# Patient Record
Sex: Female | Born: 1940 | Race: Black or African American | Hispanic: No | Smoking: Former smoker
Health system: Southern US, Community
[De-identification: ages and names within clinical notes are randomized; demographics above are authoritative.]

## PROBLEM LIST (undated history)

## (undated) DIAGNOSIS — E7800 Pure hypercholesterolemia, unspecified: Secondary | ICD-10-CM

## (undated) DIAGNOSIS — K219 Gastro-esophageal reflux disease without esophagitis: Secondary | ICD-10-CM

## (undated) DIAGNOSIS — M1990 Unspecified osteoarthritis, unspecified site: Secondary | ICD-10-CM

## (undated) DIAGNOSIS — J40 Bronchitis, not specified as acute or chronic: Secondary | ICD-10-CM

## (undated) DIAGNOSIS — IMO0001 Reserved for inherently not codable concepts without codable children: Secondary | ICD-10-CM

## (undated) DIAGNOSIS — R42 Dizziness and giddiness: Secondary | ICD-10-CM

## (undated) DIAGNOSIS — Z8701 Personal history of pneumonia (recurrent): Secondary | ICD-10-CM

## (undated) DIAGNOSIS — Z872 Personal history of diseases of the skin and subcutaneous tissue: Secondary | ICD-10-CM

## (undated) DIAGNOSIS — K449 Diaphragmatic hernia without obstruction or gangrene: Secondary | ICD-10-CM

## (undated) DIAGNOSIS — D509 Iron deficiency anemia, unspecified: Secondary | ICD-10-CM

## (undated) DIAGNOSIS — K5520 Angiodysplasia of colon without hemorrhage: Secondary | ICD-10-CM

## (undated) DIAGNOSIS — I10 Essential (primary) hypertension: Secondary | ICD-10-CM

## (undated) HISTORY — DX: Iron deficiency anemia, unspecified: D50.9

## (undated) HISTORY — DX: Essential (primary) hypertension: I10

## (undated) HISTORY — PX: OTHER SURGICAL HISTORY: SHX169

## (undated) HISTORY — DX: Personal history of diseases of the skin and subcutaneous tissue: Z87.2

## (undated) HISTORY — PX: ABDOMINAL HYSTERECTOMY: SHX81

## (undated) HISTORY — DX: Bronchitis, not specified as acute or chronic: J40

## (undated) HISTORY — PX: CHOLECYSTECTOMY: SHX55

## (undated) HISTORY — DX: Personal history of pneumonia (recurrent): Z87.01

## (undated) HISTORY — DX: Unspecified osteoarthritis, unspecified site: M19.90

## (undated) HISTORY — PX: EYE SURGERY: SHX253

## (undated) HISTORY — DX: Angiodysplasia of colon without hemorrhage: K55.20

## (undated) HISTORY — PX: APPENDECTOMY: SHX54

---

## 2008-04-30 ENCOUNTER — Emergency Department (HOSPITAL_COMMUNITY): Admission: EM | Admit: 2008-04-30 | Discharge: 2008-04-30 | Payer: Self-pay | Admitting: Emergency Medicine

## 2008-07-21 ENCOUNTER — Emergency Department (HOSPITAL_COMMUNITY): Admission: EM | Admit: 2008-07-21 | Discharge: 2008-07-21 | Payer: Self-pay | Admitting: Emergency Medicine

## 2008-07-22 ENCOUNTER — Emergency Department (HOSPITAL_COMMUNITY): Admission: EM | Admit: 2008-07-22 | Discharge: 2008-07-22 | Payer: Self-pay | Admitting: Internal Medicine

## 2008-07-29 ENCOUNTER — Emergency Department (HOSPITAL_COMMUNITY): Admission: EM | Admit: 2008-07-29 | Discharge: 2008-07-29 | Payer: Self-pay | Admitting: Emergency Medicine

## 2008-08-14 ENCOUNTER — Emergency Department (HOSPITAL_COMMUNITY): Admission: EM | Admit: 2008-08-14 | Discharge: 2008-08-14 | Payer: Self-pay | Admitting: Emergency Medicine

## 2008-08-15 ENCOUNTER — Ambulatory Visit (HOSPITAL_COMMUNITY): Admission: RE | Admit: 2008-08-15 | Discharge: 2008-08-15 | Payer: Self-pay | Admitting: Emergency Medicine

## 2008-10-01 ENCOUNTER — Emergency Department (HOSPITAL_COMMUNITY): Admission: EM | Admit: 2008-10-01 | Discharge: 2008-10-01 | Payer: Self-pay | Admitting: Emergency Medicine

## 2008-11-30 ENCOUNTER — Emergency Department (HOSPITAL_COMMUNITY): Admission: EM | Admit: 2008-11-30 | Discharge: 2008-11-30 | Payer: Self-pay | Admitting: Emergency Medicine

## 2008-12-22 ENCOUNTER — Emergency Department (HOSPITAL_COMMUNITY): Admission: EM | Admit: 2008-12-22 | Discharge: 2008-12-22 | Payer: Self-pay | Admitting: Emergency Medicine

## 2008-12-25 ENCOUNTER — Inpatient Hospital Stay (HOSPITAL_COMMUNITY): Admission: EM | Admit: 2008-12-25 | Discharge: 2008-12-26 | Payer: Self-pay | Admitting: Emergency Medicine

## 2009-04-14 HISTORY — PX: ESOPHAGOGASTRODUODENOSCOPY: SHX1529

## 2009-04-14 HISTORY — PX: COLONOSCOPY W/ BIOPSIES: SHX1374

## 2009-05-09 ENCOUNTER — Ambulatory Visit: Payer: Self-pay | Admitting: Cardiology

## 2009-05-09 ENCOUNTER — Inpatient Hospital Stay (HOSPITAL_COMMUNITY): Admission: EM | Admit: 2009-05-09 | Discharge: 2009-05-12 | Payer: Self-pay | Admitting: Emergency Medicine

## 2009-05-10 ENCOUNTER — Ambulatory Visit: Payer: Self-pay | Admitting: Gastroenterology

## 2009-05-11 ENCOUNTER — Ambulatory Visit: Payer: Self-pay | Admitting: Gastroenterology

## 2009-05-11 ENCOUNTER — Encounter: Payer: Self-pay | Admitting: Gastroenterology

## 2009-05-26 ENCOUNTER — Telehealth (INDEPENDENT_AMBULATORY_CARE_PROVIDER_SITE_OTHER): Payer: Self-pay

## 2009-06-08 ENCOUNTER — Encounter: Payer: Self-pay | Admitting: Gastroenterology

## 2009-06-16 ENCOUNTER — Telehealth (INDEPENDENT_AMBULATORY_CARE_PROVIDER_SITE_OTHER): Payer: Self-pay

## 2009-07-01 ENCOUNTER — Ambulatory Visit: Payer: Self-pay | Admitting: Gastroenterology

## 2009-07-01 DIAGNOSIS — K5520 Angiodysplasia of colon without hemorrhage: Secondary | ICD-10-CM

## 2009-07-01 HISTORY — DX: Angiodysplasia of colon without hemorrhage: K55.20

## 2009-07-29 ENCOUNTER — Ambulatory Visit (HOSPITAL_COMMUNITY): Admission: RE | Admit: 2009-07-29 | Discharge: 2009-07-29 | Payer: Self-pay | Admitting: Cardiology

## 2009-07-29 ENCOUNTER — Ambulatory Visit: Payer: Self-pay | Admitting: Cardiology

## 2009-07-29 ENCOUNTER — Encounter: Payer: Self-pay | Admitting: Cardiology

## 2009-09-01 ENCOUNTER — Encounter (INDEPENDENT_AMBULATORY_CARE_PROVIDER_SITE_OTHER): Payer: Self-pay

## 2009-09-08 ENCOUNTER — Encounter: Payer: Self-pay | Admitting: Gastroenterology

## 2009-09-08 LAB — CONVERTED CEMR LAB
Basophils Absolute: 0 10*3/uL (ref 0.0–0.1)
Basophils Relative: 0 % (ref 0–1)
Eosinophils Relative: 1 % (ref 0–5)
Ferritin: 4 ng/mL — ABNORMAL LOW (ref 10–291)
Hemoglobin: 11.2 g/dL — ABNORMAL LOW (ref 12.0–15.0)
Lymphocytes Relative: 42 % (ref 12–46)
MCHC: 31.6 g/dL (ref 30.0–36.0)
Monocytes Absolute: 0.4 10*3/uL (ref 0.1–1.0)
Neutro Abs: 2.7 10*3/uL (ref 1.7–7.7)
Platelets: 338 10*3/uL (ref 150–400)
RDW: 17.1 % — ABNORMAL HIGH (ref 11.5–15.5)

## 2009-09-09 ENCOUNTER — Encounter: Payer: Self-pay | Admitting: Gastroenterology

## 2009-09-23 ENCOUNTER — Ambulatory Visit (HOSPITAL_COMMUNITY): Payer: Self-pay | Admitting: Oncology

## 2009-09-23 ENCOUNTER — Encounter: Payer: Self-pay | Admitting: Gastroenterology

## 2009-09-23 ENCOUNTER — Encounter (HOSPITAL_COMMUNITY): Admission: RE | Admit: 2009-09-23 | Discharge: 2009-10-23 | Payer: Self-pay | Admitting: Oncology

## 2009-10-05 ENCOUNTER — Ambulatory Visit (HOSPITAL_COMMUNITY): Admission: RE | Admit: 2009-10-05 | Discharge: 2009-10-05 | Payer: Self-pay | Admitting: Oncology

## 2009-11-10 ENCOUNTER — Ambulatory Visit (HOSPITAL_COMMUNITY): Payer: Self-pay | Admitting: Oncology

## 2009-11-10 ENCOUNTER — Encounter (HOSPITAL_COMMUNITY): Admission: RE | Admit: 2009-11-10 | Discharge: 2009-12-10 | Payer: Self-pay | Admitting: Oncology

## 2009-12-22 ENCOUNTER — Encounter: Payer: Self-pay | Admitting: Gastroenterology

## 2010-01-05 ENCOUNTER — Encounter (HOSPITAL_COMMUNITY): Admission: RE | Admit: 2010-01-05 | Discharge: 2010-02-04 | Payer: Self-pay | Admitting: Oncology

## 2010-01-05 ENCOUNTER — Ambulatory Visit (HOSPITAL_COMMUNITY): Payer: Self-pay | Admitting: Oncology

## 2010-01-06 ENCOUNTER — Telehealth: Payer: Self-pay | Admitting: Gastroenterology

## 2010-01-06 ENCOUNTER — Encounter: Payer: Self-pay | Admitting: Gastroenterology

## 2010-01-12 ENCOUNTER — Ambulatory Visit: Payer: Self-pay | Admitting: Gastroenterology

## 2010-01-12 DIAGNOSIS — D500 Iron deficiency anemia secondary to blood loss (chronic): Secondary | ICD-10-CM

## 2010-01-14 ENCOUNTER — Encounter: Payer: Self-pay | Admitting: Gastroenterology

## 2010-02-03 ENCOUNTER — Encounter: Payer: Self-pay | Admitting: Gastroenterology

## 2010-02-10 ENCOUNTER — Encounter (HOSPITAL_COMMUNITY): Admission: RE | Admit: 2010-02-10 | Discharge: 2010-03-12 | Payer: Self-pay | Admitting: Oncology

## 2010-02-12 HISTORY — PX: OTHER SURGICAL HISTORY: SHX169

## 2010-02-19 ENCOUNTER — Encounter: Payer: Self-pay | Admitting: Gastroenterology

## 2010-04-15 ENCOUNTER — Encounter (HOSPITAL_COMMUNITY): Admission: RE | Admit: 2010-04-15 | Discharge: 2010-05-15 | Payer: Self-pay | Admitting: Oncology

## 2010-04-15 ENCOUNTER — Ambulatory Visit: Payer: Self-pay | Admitting: Internal Medicine

## 2010-04-15 ENCOUNTER — Ambulatory Visit (HOSPITAL_COMMUNITY): Payer: Self-pay | Admitting: Oncology

## 2010-04-15 DIAGNOSIS — K649 Unspecified hemorrhoids: Secondary | ICD-10-CM | POA: Insufficient documentation

## 2010-04-25 ENCOUNTER — Inpatient Hospital Stay (HOSPITAL_COMMUNITY): Admission: EM | Admit: 2010-04-25 | Discharge: 2010-04-27 | Payer: Self-pay | Admitting: Emergency Medicine

## 2010-04-28 ENCOUNTER — Encounter: Payer: Self-pay | Admitting: Gastroenterology

## 2010-04-29 ENCOUNTER — Encounter: Payer: Self-pay | Admitting: Urgent Care

## 2010-05-02 ENCOUNTER — Emergency Department (HOSPITAL_COMMUNITY): Admission: EM | Admit: 2010-05-02 | Discharge: 2010-05-02 | Payer: Self-pay | Admitting: Emergency Medicine

## 2010-05-25 ENCOUNTER — Encounter (HOSPITAL_COMMUNITY): Admission: RE | Admit: 2010-05-25 | Discharge: 2010-06-24 | Payer: Self-pay | Admitting: Oncology

## 2010-05-25 ENCOUNTER — Emergency Department (HOSPITAL_COMMUNITY): Admission: EM | Admit: 2010-05-25 | Discharge: 2010-05-25 | Payer: Self-pay | Admitting: Emergency Medicine

## 2010-06-15 ENCOUNTER — Ambulatory Visit (HOSPITAL_COMMUNITY): Payer: Self-pay | Admitting: Oncology

## 2010-06-19 ENCOUNTER — Emergency Department (HOSPITAL_COMMUNITY): Admission: EM | Admit: 2010-06-19 | Discharge: 2010-06-20 | Payer: Self-pay | Admitting: Emergency Medicine

## 2010-06-24 ENCOUNTER — Emergency Department (HOSPITAL_COMMUNITY): Admission: EM | Admit: 2010-06-24 | Discharge: 2010-06-24 | Payer: Self-pay | Admitting: Emergency Medicine

## 2010-07-12 ENCOUNTER — Encounter (HOSPITAL_COMMUNITY): Admission: RE | Admit: 2010-07-12 | Discharge: 2010-08-11 | Payer: Self-pay | Admitting: Oncology

## 2010-08-17 ENCOUNTER — Encounter (HOSPITAL_COMMUNITY): Admission: RE | Admit: 2010-08-17 | Discharge: 2010-09-16 | Payer: Self-pay | Admitting: Oncology

## 2010-08-17 ENCOUNTER — Ambulatory Visit (HOSPITAL_COMMUNITY): Payer: Self-pay | Admitting: Oncology

## 2010-10-19 ENCOUNTER — Ambulatory Visit (HOSPITAL_COMMUNITY)
Admission: RE | Admit: 2010-10-19 | Discharge: 2010-10-19 | Payer: Self-pay | Source: Home / Self Care | Admitting: Family Medicine

## 2010-10-19 ENCOUNTER — Ambulatory Visit (HOSPITAL_COMMUNITY): Payer: Self-pay | Admitting: Oncology

## 2010-10-19 ENCOUNTER — Encounter (HOSPITAL_COMMUNITY)
Admission: RE | Admit: 2010-10-19 | Discharge: 2010-11-18 | Payer: Self-pay | Source: Home / Self Care | Attending: Oncology | Admitting: Oncology

## 2010-12-14 ENCOUNTER — Ambulatory Visit (HOSPITAL_COMMUNITY)
Admission: RE | Admit: 2010-12-14 | Discharge: 2010-12-14 | Payer: Self-pay | Source: Home / Self Care | Attending: Oncology | Admitting: Oncology

## 2010-12-14 ENCOUNTER — Encounter (HOSPITAL_COMMUNITY)
Admission: RE | Admit: 2010-12-14 | Discharge: 2010-12-14 | Payer: Self-pay | Source: Home / Self Care | Attending: Oncology | Admitting: Oncology

## 2010-12-14 LAB — CBC
HCT: 35.5 % — ABNORMAL LOW (ref 36.0–46.0)
MCHC: 33.2 g/dL (ref 30.0–36.0)
MCV: 91 fL (ref 78.0–100.0)
Platelets: 277 10*3/uL (ref 150–400)
RDW: 13.8 % (ref 11.5–15.5)
WBC: 4.2 10*3/uL (ref 4.0–10.5)

## 2010-12-14 NOTE — Letter (Signed)
Summary: DBE-DR Gwinda Passe  DBE-DR Gwinda Passe   Imported By: Ave Filter 04/29/2010 13:32:31  _____________________________________________________________________  External Attachment:    Type:   Image     Comment:   External Document

## 2010-12-14 NOTE — Letter (Signed)
Summary: [facility]  [facility]   Imported By: Diana Eves 02/03/2010 15:26:03  _____________________________________________________________________  External Attachment:    Type:   Image     Comment:   External Document

## 2010-12-14 NOTE — Assessment & Plan Note (Signed)
Summary: FeDA/SB AVMS   Visit Type:  Follow-up Visit Primary Care Provider:  Casilda Carls, NP-c  Chief Complaint:  FeDA.  History of Present Illness: Problems with R hip and knee. Seeing Dr. Dorris Carnes for IVFe : 3/2,10. Not taking Ibuprofen, Motrin, or Aleve. No rectal bleeding or black tarry stool. Takes oral iron and makes stool dark.  Current Medications (verified): 1)  Cyanocobalamin 1000 Mcg/ml Soln (Cyanocobalamin) .... One Injection Monthly 2)  Fluticasone Propionate 50 Mcg/act Susp (Fluticasone Propionate) .... As Directed 3)  Docusate Sodium .... Take 1 Tablet By Mouth Two Times A Day As Needed 4)  Nexium 40 Mg Cpdr (Esomeprazole Magnesium) .... Take 1 Tablet By Mouth Once A Day 5)  Pravachol 40 Mg Tabs (Pravastatin Sodium) .... Take 1 Tab By Mouth At Bedtime 6)  Antivert 25 Mg Tabs (Meclizine Hcl) .... Take One To Two Tablets Daily 7)  Cetirizine Hcl 10 Mg Tabs (Cetirizine Hcl) .... Take 1 Tablet By Mouth Once A Day 8)  Multivitamin .... One Tablet Daily 9)  Tylenol Arthritis .... As Needed 10)  Tylenol .... As Needed 11)  Bifera 28 Mg Tabs (Polysacch Fe Cmp-Fe Heme Poly) .... Take 1 Tablet By Mouth Once A Day 12)  Carafate 1 Gm/47ml Susp (Sucralfate) .Marland Kitchen.. 1 Gm By Mouth Qid As Needed Dyspepsia  Allergies (verified): 1)  ! Sulfa 2)  ! * Latex  Past History:  Past Medical History: FeDA/SMALL BOWEL avmS **JUNE 2010-EGD/bX-GASTRITIS/Colonoscopy/polypectomy-polypoid mucosa; OCT 2010: Hb 11.2, MCV 84.1, Ferritin 4; PLACED ON ORAL Fe-Ferritin <1-->NOV 2010: IVFe- Hb 10.6 Ferritin 594; DEC 2010: Hb 11.2, Ferritin 146; FEB 2011- Hb 11.7, Ferritin 23  GERD Hiatal Hernia DDD  Past Surgical History: Reviewed history from 07/01/2009 and no changes required. Appendectomy Hysterectomy Cholecystectomy  Vital Signs:  Patient profile:   70 year old female Height:      64 inches Weight:      146 pounds BMI:     25.15 Temp:     98.9 degrees F oral Pulse rate:   80 / minute BP sitting:    138 / 88  (left arm) Cuff size:   regular  Vitals Entered By: Cloria Spring LPN (January 12, 9562 10:18 AM)  Physical Exam  General:  Well developed, well nourished, no acute distress. Head:  Normocephalic and atraumatic. Lungs:  Clear throughout to auscultation. Heart:  Regular rate and rhythm; no murmurs Abdomen:  Soft, nontender and nondistended.  Normal bowel sounds. Extremities:  walks with a limp Neurologic:  Alert and  oriented x4;  grossly normal neurologically.  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 Needs DBE for ablation of AVMs. OPV in 3 mos.  CC: PCP and Dr. Mariel Sleet  Orders: Est. Patient Level III [phone])

## 2010-12-14 NOTE — Letter (Signed)
Summary: External Other  External Other   Imported By: Peggyann Shoals 04/28/2010 14:52:05  _____________________________________________________________________  External Attachment:    Type:   Image     Comment:   External Document

## 2010-12-14 NOTE — Letter (Signed)
Summary: [facility] office note  [facility] office note   Imported By: Curtis Sites 04/29/2010 10:58:17  _____________________________________________________________________  External Attachment:    Type:   Image     Comment:   External Document

## 2010-12-14 NOTE — Progress Notes (Signed)
Summary: GIB Bleed/AVMs  Spoke with Dr. Mariel Sleet, [facility]. Pt had IV FE infusions and Ferritin > 500 NOV 2010. Recheck Ferritin JAN 2011, < 25. Pt has known Hx: AVMs in the small bowel. Refer to [facility] for DBE/ablation of smal bowel AVMs. Melody Mitchell  January 06, 2010 1:30 PM   Please call pt. Arrange for OPV w/ [facility] to discuss plan w/i the next week. May use 1130 slot. Melody Mitchell  January 06, 2010 1:30 PM  Appended Document: GIB Bleed/AVMs Referral faxed to [facility]

## 2010-12-14 NOTE — Letter (Signed)
Summary: [facility] REFERRAL  [facility] REFERRAL   Imported By: Ave Filter 01/06/2010 15:44:14  _____________________________________________________________________  External Attachment:    Type:   Image     Comment:   External Document  Appended Document: [facility] REFERRAL Per Marchelle Folks at [facility] the pt's info has been given to Dr Gwinda Passe and he should give her a date w/i the next couple of days.

## 2010-12-14 NOTE — Letter (Signed)
Summary: [facility] APPT CONFIRMATION  [facility] APPT CONFIRMATION   Imported By: Ave Filter 01/14/2010 08:35:16  _____________________________________________________________________  External Attachment:    Type:   Image     Comment:   External Document

## 2010-12-14 NOTE — Assessment & Plan Note (Signed)
Summary: FU3,IRON DEFF,ANEMIA,GU   Visit Type:  Follow-up Visit Primary Care Provider:  Ennis Forts, Lewayne Bunting  Chief Complaint:  follow up- anemia and doing good.  History of Present Illness: 70 y/o black female w/ hx IDA 2* multiple jejunal/duodenal AVMs s/p APC at [facility] by Dr Gwinda Passe.  Has required parenteral iron through Dr Thornton Papas services.  Had labs this AM.  Denies any melena.  Hs had bright red blood from her hemorrhoids.  Denies CP, weakness, palpitations or SOB.  Denies abd pain, N/V, constipation or diarrhea.  Wt states she's gained a few #'s. Appetite good.    Current Problems (verified): 1)  Anemia, Iron Deficiency  (ICD-280.9) 2)  Arteriovenous Malformation  (ICD-747.60)  Current Medications (verified): 1)  Cyanocobalamin 1000 Mcg/ml Soln (Cyanocobalamin) .... One Injection Monthly 2)  Fluticasone Propionate 50 Mcg/act Susp (Fluticasone Propionate) .... As Directed 3)  Docusate Sodium .... Take 1 Tablet By Mouth Two Times A Day As Needed 4)  Pravachol 40 Mg Tabs (Pravastatin Sodium) .... Take 1 Tab By Mouth At Bedtime 5)  Antivert 25 Mg Tabs (Meclizine Hcl) .... Take One To Two Tablets Daily 6)  Cetirizine Hcl 10 Mg Tabs (Cetirizine Hcl) .... Take 1 Tablet By Mouth Once A Day 7)  Multivitamin .... One Tablet Daily 8)  Tylenol Arthritis .... As Needed 9)  Tylenol .... As Needed 10)  Bifera 28 Mg Tabs (Polysacch Fe Cmp-Fe Heme Poly) .... Take 1 Tablet By Mouth Once A Day 11)  Carafate 1 Gm/54ml Susp (Sucralfate) .Marland Kitchen.. 1 Gm By Mouth Qid As Needed Dyspepsia 12)  Omeprazole 20 Mg Cpdr (Omeprazole) .... Once Daily 13)  Visine Dry Eye .... As Needed 14)  Anusol-Hc 25 Mg Supp (Hydrocortisone Acetate) .Marland Kitchen.. 1 Pr Bid  Allergies (verified): 1)  ! Sulfa 2)  ! * Latex  Past History:  Past Medical History: FeDA/SMALL BOWEL avmS **JUNE 2010-EGD/bX-GASTRITIS/Colonoscopy/polypectomy-polypoid mucosa; OCT 2010: Hb 11.2, MCV 84.1, Ferritin 4; PLACED ON ORAL Fe-Ferritin <1-->NOV  2010: IVFe- Hb 10.6 Ferritin 594; DEC 2010: Hb 11.2, Ferritin 146; FEB 2011- Hb 11.7, Ferritin 23 APC SB AVMS [facility] Dr Gwinda Passe  GERD Hiatal Hernia DDD hemorrhoids  Review of Systems      See HPI General:  Denies fever, chills, sweats, anorexia, fatigue, weakness, malaise, weight loss, and sleep disorder; occ dizziness AM. CV:  Denies chest pains, angina, palpitations, syncope, dyspnea on exertion, orthopnea, PND, peripheral edema, and claudication. Resp:  Denies dyspnea at rest, dyspnea with exercise, cough, sputum, wheezing, coughing up blood, and pleurisy. Neuro:  Denies weakness, paralysis, abnormal sensation, seizures, syncope, tremors, vertigo, transient blindness, frequent falls, frequent headaches, difficulty walking, headache, sciatica, radiculopathy other:, restless legs, memory loss, and confusion. Heme:  Denies bruising and enlarged lymph nodes.  Vital Signs:  Patient profile:   70 year old female Height:      64 inches Weight:      149 pounds BMI:     25.67 Temp:     98.7 degrees F oral Pulse rate:   64 / minute BP sitting:   112 / 82  (left arm) Cuff size:   regular  Vitals Entered By: Hendricks Limes LPN (April 15, 8118 8:57 AM)  Physical Exam  General:  Well developed, well nourished, no acute distress. Head:  Normocephalic and atraumatic. Eyes:  Sclera clear, no icterus. Mouth:  No deformity or lesions, dentition normal. Neck:  Supple; no masses or thyromegaly. Chest Wall:  Symmetrical;  no deformities or tenderness. Heart:  Regular rate and rhythm;  no murmurs, rubs,  or bruits. Abdomen:  Soft, nontender and nondistended. No masses, hepatosplenomegaly or hernias noted. Normal bowel sounds.without guarding and without rebound.   Msk:  Symmetrical with no gross deformities. Normal posture. Pulses:  Normal pulses noted. Extremities:  No clubbing, cyanosis, edema or deformities noted. Neurologic:  Alert and  oriented x4;  grossly normal neurologically. Skin:   Intact without significant lesions or rashes. Psych:  Alert and cooperative. Normal mood and affect.  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  Doing well.   Orders: Est. Patient Level III (24401)  Problem # 2:  ANEMIA, IRON DEFICIENCY (ICD-280.9)  Parenteral iron under direction Dr Mariel Sleet.  Labs drawn today.  Orders: Est. Patient Level III (02725)  Problem # 3:  HEMORRHOIDS (ICD-455.6)  Orders: Est. Patient Level III (36644)  Patient Instructions: 1)  Begin anusol-hc supp 2)  Requested labs Dr Mariel Sleet drawn today 3)  Keep 05/25/10 appt Dr Mariel Sleet 4)  Follow 3 months or sooner if bleeding or unable to maintain hemoglobin 5)  Talk to your PCP about knee pain if tylenol arthritis no help.  We would like to avoid NSAIDs if at all possible. Prescriptions: ANUSOL-HC 25 MG SUPP (HYDROCORTISONE ACETATE) 1 PR BID  #20 x 0   Entered and Authorized by:   Joselyn Arrow FNP-BC   Signed by:   Joselyn Arrow FNP-BC on 04/15/2010   Method used:   Electronically to        [facility] (retail)       [address]       [place], [place]  [address]       Ph: [phone]       Fax: [phone]   RxID:   [id number]   Appended Document: FU3,IRON DEFF,ANEMIA,GU reminder in computer  Appended Document: FU3,IRON DEFF,ANEMIA,GU Please get recent CBC from Dr Thornton Papas office.  Thanks  Appended Document: FU3,IRON DEFF,ANEMIA,GU do we have most recent CBC from [facility] yet?  Appended Document: Berenda Morale [facility] for nurses @ Specialty to fax over the lastest CBC.  Appended Document: FU3,IRON DEFF,ANEMIA,GU Please see CBC of 04/26/2010. Per Dr. Arnell Asal office, they do not have a CBC more recent.  Appended Document: FU3,IRON DEFF,ANEMIA,GU Hgb 11.2, Hct 33.3 on 04/15/10, ferritin 115  Appended Document: FU3,IRON  DEFF,ANEMIA,GU JULY 2011-OPV W/ DR. Mariel Sleet.

## 2010-12-14 NOTE — Letter (Signed)
Summary: OFFICE NOTE/DR Mariel Sleet  OFFICE NOTE/DR [facility]   Imported By: Diana Eves 12/22/2009 15:54:10  _____________________________________________________________________  External Attachment:    Type:   Image     Comment:   External Document

## 2010-12-15 ENCOUNTER — Ambulatory Visit (HOSPITAL_COMMUNITY): Payer: Self-pay | Admitting: Oncology

## 2010-12-20 ENCOUNTER — Encounter (HOSPITAL_COMMUNITY): Payer: Medicare Other | Attending: Oncology | Admitting: Oncology

## 2010-12-20 DIAGNOSIS — Z79899 Other long term (current) drug therapy: Secondary | ICD-10-CM | POA: Insufficient documentation

## 2010-12-20 DIAGNOSIS — D509 Iron deficiency anemia, unspecified: Secondary | ICD-10-CM | POA: Insufficient documentation

## 2011-01-03 ENCOUNTER — Ambulatory Visit (INDEPENDENT_AMBULATORY_CARE_PROVIDER_SITE_OTHER): Payer: Medicare Other | Admitting: Urgent Care

## 2011-01-03 ENCOUNTER — Encounter: Payer: Self-pay | Admitting: Urgent Care

## 2011-01-03 DIAGNOSIS — D509 Iron deficiency anemia, unspecified: Secondary | ICD-10-CM

## 2011-01-03 DIAGNOSIS — K219 Gastro-esophageal reflux disease without esophagitis: Secondary | ICD-10-CM | POA: Insufficient documentation

## 2011-01-03 DIAGNOSIS — Q279 Congenital malformation of peripheral vascular system, unspecified: Secondary | ICD-10-CM

## 2011-01-05 ENCOUNTER — Emergency Department (HOSPITAL_COMMUNITY): Payer: Medicare Other

## 2011-01-05 ENCOUNTER — Emergency Department (HOSPITAL_COMMUNITY)
Admission: EM | Admit: 2011-01-05 | Discharge: 2011-01-05 | Disposition: A | Discharge status: Home / Self Care | Payer: Medicare Other | Attending: Emergency Medicine | Admitting: Emergency Medicine

## 2011-01-05 DIAGNOSIS — R42 Dizziness and giddiness: Secondary | ICD-10-CM | POA: Insufficient documentation

## 2011-01-05 DIAGNOSIS — E876 Hypokalemia: Secondary | ICD-10-CM | POA: Insufficient documentation

## 2011-01-05 LAB — BASIC METABOLIC PANEL
CO2: 27 mEq/L (ref 19–32)
Calcium: 8.9 mg/dL (ref 8.4–10.5)
Creatinine, Ser: 0.73 mg/dL (ref 0.4–1.2)
GFR calc Af Amer: >60 mL/min (ref >60)
GFR calc non Af Amer: >60 mL/min (ref >60)
Sodium: 143 mEq/L (ref 135–145)

## 2011-01-05 LAB — DIFFERENTIAL
Basophils Absolute: 0 10*3/uL (ref 0.0–0.1)
Basophils Relative: 0 % (ref 0–1)
Eosinophils Absolute: 0.1 10*3/uL (ref 0.0–0.7)
Monocytes Absolute: 0.3 10*3/uL (ref 0.1–1.0)
Monocytes Relative: 6 % (ref 3–12)

## 2011-01-05 LAB — CBC
MCH: 30.2 pg (ref 26.0–34.0)
MCHC: 33 g/dL (ref 30.0–36.0)
Platelets: 263 10*3/uL (ref 150–400)
RDW: 13.5 % (ref 11.5–15.5)

## 2011-01-05 LAB — POCT CARDIAC MARKERS
CKMB, poc: 1 ng/mL (ref 1.0–8.0)
Myoglobin, poc: 61.8 ng/mL (ref 12–200)

## 2011-01-05 LAB — URINALYSIS, ROUTINE W REFLEX MICROSCOPIC
Protein, ur: NEGATIVE mg/dL
Urobilinogen, UA: 0.2 mg/dL (ref 0.0–1.0)

## 2011-01-05 LAB — URINE MICROSCOPIC-ADD ON

## 2011-01-07 ENCOUNTER — Encounter: Payer: Self-pay | Admitting: Urgent Care

## 2011-01-11 NOTE — Assessment & Plan Note (Signed)
Summary: ANEMIA   Vital Signs:  Patient profile:   70 year old female Menstrual status:  postmenopausal Height:      64 inches Weight:      147 pounds Temp:     98.3 degrees F Pulse rate:   56 / minute BP supine:   134 / 76  Visit Type:  Follow-up Consult Referring Provider:  Dr Mariel Sleet Primary Care Provider:  Ennis Forts, MD At [facility]  Chief Complaint:  anemia.  History of Present Illness: 70 y/o black female w/ hx IDA 2* multiple jejunal/duodenal AVMs s/p APC at [facility] by Dr Gwinda Passe 2011.  Has required parenteral iron through Dr Thornton Papas services.  Last infusion in Jan or Dec? pt cannot remember.  Pt was told hgb dropped.  Denies any melena, rectal bleeding, abd pain, nausea, vomiting.  Has had heartburn but well-controlled on nexium 40mg  daily.  Denies CP, weakness, palpitations or SOB.  Wt stable.  c/o small amt bright red blood w/ blowing nose.  Denies fever or chills.  c/o low back pain, takes tramadol-helps some.  Appetite good.    12/14/10  Ferritin                                 243                                ng/mL WBC                                      4.2               4.0-10.5         K/uL  RBC                                      3.90              3.87-5.11        MIL/uL  Hemoglobin (HGB)                         11.8       l      12.0-15.0        g/dL  Hematocrit (HCT)                         35.5       l      36.0-46.0        %  MCV                                      91.0              78.0-100.0       fL  MCH -                                    30.3              26.0-34.0  pg  MCHC                                     33.2              30.0-36.0        g/dL  RDW                                      13.8              11.5-15.5        %  Platelet Count (PLT)                     277               150-400          K/uL   Current Problems (verified): 1)  Gerd  (ICD-530.81) 2)  Hemorrhoids  (ICD-455.6) 3)  Anemia, Iron Deficiency   (ICD-280.9) 4)  Arteriovenous Malformation  (ICD-747.60)  Current Medications (verified): 1)  Cyanocobalamin 1000 Mcg/ml Soln (Cyanocobalamin) .... One Injection Monthly 2)  Fluticasone Propionate 50 Mcg/act Susp (Fluticasone Propionate) .... As Directed 3)  Docusate Sodium .... Take 1 Tablet By Mouth Two Times A Day As Needed 4)  Antivert 25 Mg Tabs (Meclizine Hcl) .... Take One Up To Three Times A Day For Vertigo 5)  Cetirizine Hcl 10 Mg Tabs (Cetirizine Hcl) .... Take 1 Tablet By Mouth Once A Day 6)  Multivitamin .... One Tablet Daily 7)  Tylenol Arthritis .... As Needed 8)  Tylenol .... As Needed 9)  Carafate 1 Gm/40ml Susp (Sucralfate) .Marland Kitchen.. 1 Gm By Mouth Qid As Needed Dyspepsia 10)  Nexium 40 Mg Cpdr (Esomeprazole Magnesium) .Marland Kitchen.. 1 By Mouth Daily For Acid Reflux 11)  Tramadol Hcl 50 Mg Tabs (Tramadol Hcl) .Marland Kitchen.. 1 By Mouth Q6hrs As Needed Pain 12)  Folic Acid 1 Mg Tabs (Folic Acid) .Marland Kitchen.. 1 By Mouth Daily 13)  Bifera 28 Mg Tabs (Polysacch Fe Cmp-Fe Heme Poly) .Marland Kitchen.. 1 By Mouth Daily  Allergies (verified): 1)  ! Sulfa 2)  ! * Latex  Past History:  Past Medical History: Last updated: 04/15/2010 FeDA/SMALL BOWEL avmS **JUNE 2010-EGD/bX-GASTRITIS/Colonoscopy/polypectomy-polypoid mucosa; OCT 2010: Hb 11.2, MCV 84.1, Ferritin 4; PLACED ON ORAL Fe-Ferritin <1-->NOV 2010: IVFe- Hb 10.6 Ferritin 594; DEC 2010: Hb 11.2, Ferritin 146; FEB 2011- Hb 11.7, Ferritin 23 APC SB AVMS [facility] Dr Gwinda Passe  GERD Hiatal Hernia DDD hemorrhoids  Past Surgical History: Last updated: 07/01/2009 Appendectomy Hysterectomy Cholecystectomy  Family History: No known family history of colorectal carcinoma, IBD, liver or chronic GI problems.  Social History: lives alone divorced 1 son-healthy Patient is a former smoker. quit 1 1/2 yrs ago, 20+pkyr hx Alcohol Use - no Illicit Drug Use - no Smoking Status:  quit Drug Use:  no  Review of Systems      See HPI General:  Denies fever, chills, sweats,  anorexia, fatigue, weakness, malaise, weight loss, and sleep disorder. CV:  Denies chest pains, angina, palpitations, syncope, dyspnea on exertion, orthopnea, PND, peripheral edema, and claudication. Resp:  Denies dyspnea at rest, dyspnea with exercise, cough, sputum, wheezing, coughing up blood, and pleurisy. GI:  Denies difficulty swallowing, pain on swallowing, jaundice, and fecal incontinence. GU:  Denies urinary burning, blood in urine, nocturnal urination, urinary  frequency, urinary incontinence, abnormal vaginal bleeding, and amenorrhea. MS:  Denies joint pain / LOM, joint swelling, joint stiffness, joint deformity, low back pain, muscle weakness, muscle cramps, muscle atrophy, leg pain at night, leg pain with exertion, and shoulder pain / LOM hand / wrist pain (CTS). Derm:  Denies rash, itching, dry skin, hives, moles, warts, and unhealing ulcers. Psych:  Denies depression, anxiety, memory loss, suicidal ideation, hallucinations, paranoia, phobia, and confusion. Heme:  Denies bruising, bleeding, and enlarged lymph nodes.  Physical Exam  General:  Well developed, well nourished, no acute distress. Head:  Normocephalic and atraumatic. Eyes:  Sclera clear, no icterus. Mouth:  No deformity or lesions, dentition normal. Neck:  Supple; no masses or thyromegaly. Heart:  Regular rate and rhythm; no murmurs, rubs,  or bruits. Abdomen:  Soft, nontender and nondistended. No masses, hepatosplenomegaly or hernias noted. Normal bowel sounds.without guarding and without rebound.   Msk:  Symmetrical with no gross deformities. Normal posture. Pulses:  Normal pulses noted. Extremities:  No clubbing, cyanosis, edema or deformities noted. Neurologic:  Alert and  oriented x4;  grossly normal neurologically. Skin:  Intact without significant lesions or rashes. Cervical Nodes:  No significant cervical adenopathy. Psych:  Alert and cooperative. Normal mood and affect.   Impression &  Recommendations:  Problem # 1:  ANEMIA, IRON DEFICIENCY (ICD-280.9) 70 y/o black female w/ hx B-12 deficiency, previously received parenteral iron under direction Dr Mariel Sleet.  Hx multiple SB AVM's s/p APC treatment at [facility] by Dr Gwinda Passe last in 02/2010.  Had been doing well, now w/ mild anemia.  No overt GI bleeding or symptoms.  Orders: Est. 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  Patient Instructions: 1)  Requested 2/14 labs from [facility] 2)  If recent hgb 11-range, will continue to monitor & recheck in 2-3 mo on iron 3)  If melena or any new GI symptoms, pt is to call us Prescriptions: BIFERA 28 MG TABS (POLYSACCH FE CMP-FE HEME POLY) 1 by mouth daily  #31 x 2   Entered and Authorized by:   Joselyn Arrow FNP-BC   Signed by:   Joselyn Arrow FNP-BC on 01/03/2011   Method used:   Electronically to        [facility] (retail)       [address]       [place], [place]  [address]       Ph: [phone]       Fax: [phone]   RxID:   [id number]    Orders Added: 1)  Est. Patient Level III [72536]  Appended Document: Orders Update Labs reviewed.  No recent CBC. Recheck CBC in 6 weeks [id number], hx SB AVMs Please fax to lab & let pt know Thanks    Clinical Lists Changes  Orders: Added new Test order of T-CBC w/Diff [id number]) - Signed      Appended Document: ANEMIA pt aware, requested lab sent to her because she has to go back to pcp in 5 weeks for repeat labs and will take it with her when she goes  Appended Document: ANEMIA lab order mailed to pt

## 2011-01-11 NOTE — Letter (Signed)
Summary: REFERRAL FROM [facility]  REFERRAL FROM [facility]   Imported By: Rexene Alberts 01/07/2011 11:58:34  _____________________________________________________________________  External Attachment:    Type:   Image     Comment:   External Document

## 2011-01-14 ENCOUNTER — Encounter: Payer: Self-pay | Admitting: Urgent Care

## 2011-01-20 NOTE — Letter (Signed)
Summary: RECENT LABS & PROG NOTES FROM [facility]  RECENT LABS & PROG NOTES FROM [facility]   Imported By: Rexene Alberts 01/07/2011 14:43:40  _____________________________________________________________________  External Attachment:    Type:   Image     Comment:   External Document

## 2011-01-25 LAB — CBC
MCH: 30 pg (ref 26.0–34.0)
MCV: 89.4 fL (ref 78.0–100.0)
Platelets: 271 10*3/uL (ref 150–400)
RDW: 15.2 % (ref 11.5–15.5)

## 2011-01-25 NOTE — Letter (Addendum)
Summary: LABS FROM [facility]  LABS FROM [facility]   Imported By: Rexene Alberts 01/14/2011 08:52:40  _____________________________________________________________________  External Attachment:    Type:   Image     Comment:   External Document  Appended Document: LABS FROM [facility] Hgb stable  Appended Document: LABS FROM [facility] Needs OV 3 mo  Appended Document: LABS [facility] reminder in epic

## 2011-01-27 LAB — CBC
HCT: 38.9 % (ref 36.0–46.0)
Hemoglobin: 12.3 g/dL (ref 12.0–15.0)
MCH: 30 pg (ref 26.0–34.0)
MCHC: 32.5 g/dL (ref 30.0–36.0)
MCHC: 33.2 g/dL (ref 30.0–36.0)
Platelets: 237 10*3/uL (ref 150–400)
RDW: 15 % (ref 11.5–15.5)

## 2011-01-27 LAB — FERRITIN: Ferritin: 441 ng/mL — ABNORMAL HIGH (ref 10–291)

## 2011-01-28 LAB — CBC
MCHC: 33.6 g/dL (ref 30.0–36.0)
MCV: 91.7 fL (ref 78.0–100.0)
Platelets: 272 10*3/uL (ref 150–400)
RDW: 14 % (ref 11.5–15.5)
WBC: 8.4 10*3/uL (ref 4.0–10.5)

## 2011-01-28 LAB — URINALYSIS, ROUTINE W REFLEX MICROSCOPIC
Bilirubin Urine: NEGATIVE
Hgb urine dipstick: NEGATIVE
Ketones, ur: NEGATIVE mg/dL
Protein, ur: NEGATIVE mg/dL
Urobilinogen, UA: 0.2 mg/dL (ref 0.0–1.0)

## 2011-01-28 LAB — URINE MICROSCOPIC-ADD ON

## 2011-01-30 LAB — POCT I-STAT, CHEM 8
BUN: 6 mg/dL (ref 6–23)
Chloride: 106 mEq/L (ref 96–112)
Creatinine, Ser: 0.9 mg/dL (ref 0.4–1.2)
Glucose, Bld: 93 mg/dL (ref 70–99)
Potassium: 4.1 mEq/L (ref 3.5–5.1)

## 2011-01-31 LAB — BASIC METABOLIC PANEL
BUN: 5 mg/dL — ABNORMAL LOW (ref 6–23)
BUN: 6 mg/dL (ref 6–23)
CO2: 24 mEq/L (ref 19–32)
CO2: 29 mEq/L (ref 19–32)
Calcium: 9.2 mg/dL (ref 8.4–10.5)
Calcium: 9.8 mg/dL (ref 8.4–10.5)
Chloride: 110 mEq/L (ref 96–112)
Creatinine, Ser: 0.77 mg/dL (ref 0.4–1.2)
GFR calc Af Amer: >60 mL/min (ref >60)
GFR calc non Af Amer: >60 mL/min (ref >60)
Glucose, Bld: 109 mg/dL — ABNORMAL HIGH (ref 70–99)
Glucose, Bld: 146 mg/dL — ABNORMAL HIGH (ref 70–99)
Potassium: 3 mEq/L — ABNORMAL LOW (ref 3.5–5.1)

## 2011-01-31 LAB — CBC
HCT: 32.7 % — ABNORMAL LOW (ref 36.0–46.0)
HCT: 33.3 % — ABNORMAL LOW (ref 36.0–46.0)
Hemoglobin: 10.8 g/dL — ABNORMAL LOW (ref 12.0–15.0)
Hemoglobin: 11.2 g/dL — ABNORMAL LOW (ref 12.0–15.0)
MCHC: 32.8 g/dL (ref 30.0–36.0)
MCHC: 33.1 g/dL (ref 30.0–36.0)
MCHC: 33.6 g/dL (ref 30.0–36.0)
MCV: 91.6 fL (ref 78.0–100.0)
MCV: 92.8 fL (ref 78.0–100.0)
Platelets: 245 10*3/uL (ref 150–400)
Platelets: 246 10*3/uL (ref 150–400)
RBC: 3.63 MIL/uL — ABNORMAL LOW (ref 3.87–5.11)
RDW: 14.2 % (ref 11.5–15.5)
RDW: 14.4 % (ref 11.5–15.5)
RDW: 14.6 % (ref 11.5–15.5)

## 2011-01-31 LAB — DIFFERENTIAL
Basophils Absolute: 0 10*3/uL (ref 0.0–0.1)
Basophils Absolute: 0 10*3/uL (ref 0.0–0.1)
Basophils Absolute: 0 10*3/uL (ref 0.0–0.1)
Basophils Relative: 0 % (ref 0–1)
Basophils Relative: 0 % (ref 0–1)
Basophils Relative: 0 % (ref 0–1)
Eosinophils Absolute: 0 10*3/uL (ref 0.0–0.7)
Eosinophils Relative: 1 % (ref 0–5)
Eosinophils Relative: 2 % (ref 0–5)
Lymphocytes Relative: 27 % (ref 12–46)
Monocytes Absolute: 0.4 10*3/uL (ref 0.1–1.0)
Monocytes Absolute: 0.4 10*3/uL (ref 0.1–1.0)
Monocytes Relative: 1 % — ABNORMAL LOW (ref 3–12)
Monocytes Relative: 9 % (ref 3–12)
Neutro Abs: 4.8 10*3/uL (ref 1.7–7.7)
Neutrophils Relative %: 90 % — ABNORMAL HIGH (ref 43–77)

## 2011-01-31 LAB — PROTIME-INR: Prothrombin Time: 13 seconds (ref 11.6–15.2)

## 2011-01-31 LAB — SEDIMENTATION RATE: Sed Rate: 50 mm/hr — ABNORMAL HIGH (ref 0–22)

## 2011-02-02 LAB — FERRITIN: Ferritin: 23 ng/mL (ref 10–291)

## 2011-02-02 LAB — CBC
Platelets: 249 10*3/uL (ref 150–400)
WBC: 5.3 10*3/uL (ref 4.0–10.5)

## 2011-02-06 LAB — CBC
Hemoglobin: 11.6 g/dL — ABNORMAL LOW (ref 12.0–15.0)
RBC: 3.58 MIL/uL — ABNORMAL LOW (ref 3.87–5.11)
WBC: 4.7 10*3/uL (ref 4.0–10.5)

## 2011-02-07 ENCOUNTER — Encounter (HOSPITAL_COMMUNITY): Payer: Self-pay | Admitting: Radiology

## 2011-02-07 ENCOUNTER — Emergency Department (HOSPITAL_COMMUNITY)
Admission: EM | Admit: 2011-02-07 | Discharge: 2011-02-07 | Disposition: A | Discharge status: Home / Self Care | Payer: Medicare Other | Attending: Emergency Medicine | Admitting: Emergency Medicine

## 2011-02-07 ENCOUNTER — Emergency Department (HOSPITAL_COMMUNITY): Payer: Medicare Other

## 2011-02-07 DIAGNOSIS — E7800 Pure hypercholesterolemia, unspecified: Secondary | ICD-10-CM | POA: Insufficient documentation

## 2011-02-07 DIAGNOSIS — R51 Headache: Secondary | ICD-10-CM | POA: Insufficient documentation

## 2011-02-07 DIAGNOSIS — D509 Iron deficiency anemia, unspecified: Secondary | ICD-10-CM | POA: Insufficient documentation

## 2011-02-07 DIAGNOSIS — H8309 Labyrinthitis, unspecified ear: Secondary | ICD-10-CM | POA: Insufficient documentation

## 2011-02-07 DIAGNOSIS — Z79899 Other long term (current) drug therapy: Secondary | ICD-10-CM | POA: Insufficient documentation

## 2011-02-07 DIAGNOSIS — R110 Nausea: Secondary | ICD-10-CM | POA: Insufficient documentation

## 2011-02-07 LAB — URINALYSIS, ROUTINE W REFLEX MICROSCOPIC
Glucose, UA: NEGATIVE mg/dL
pH: 6.5 (ref 5.0–8.0)

## 2011-02-07 LAB — BASIC METABOLIC PANEL
Chloride: 109 mEq/L (ref 96–112)
Creatinine, Ser: 0.73 mg/dL (ref 0.4–1.2)
GFR calc Af Amer: >60 mL/min (ref >60)
GFR calc non Af Amer: >60 mL/min (ref >60)
Potassium: 3.9 mEq/L (ref 3.5–5.1)

## 2011-02-14 LAB — CBC
HCT: 34.3 % — ABNORMAL LOW (ref 36.0–46.0)
MCV: 88.7 fL (ref 78.0–100.0)
RBC: 3.88 MIL/uL (ref 3.87–5.11)
WBC: 4.6 10*3/uL (ref 4.0–10.5)

## 2011-02-14 LAB — FERRITIN: Ferritin: 146 ng/mL (ref 10–291)

## 2011-02-16 LAB — CBC
HCT: 32.8 % — ABNORMAL LOW (ref 36.0–46.0)
HCT: 30.9 % — ABNORMAL LOW (ref 36.0–46.0)
Hemoglobin: 10.6 g/dL — ABNORMAL LOW (ref 12.0–15.0)
Hemoglobin: 10.1 g/dL — ABNORMAL LOW (ref 12.0–15.0)
MCHC: 32.8 g/dL (ref 30.0–36.0)
MCV: 82.4 fL (ref 78.0–100.0)
RBC: 3.85 MIL/uL — ABNORMAL LOW (ref 3.87–5.11)
RBC: 3.75 MIL/uL — ABNORMAL LOW (ref 3.87–5.11)
RDW: 21.7 % — ABNORMAL HIGH (ref 11.5–15.5)

## 2011-02-16 LAB — COPPER, SERUM: Copper: 120 ug/dL (ref 70–175)

## 2011-02-21 LAB — CARDIAC PANEL(CRET KIN+CKTOT+MB+TROPI)
CK, MB: 0.7 ng/mL (ref 0.3–4.0)
CK, MB: 0.9 ng/mL (ref 0.3–4.0)
Relative Index: INVALID (ref 0.0–2.5)
Relative Index: INVALID (ref 0.0–2.5)
Total CK: 82 U/L (ref 7–177)
Total CK: 87 U/L (ref 7–177)
Troponin I: 0.02 ng/mL (ref 0.00–0.06)
Troponin I: 0.02 ng/mL (ref 0.00–0.06)

## 2011-02-21 LAB — CBC
HCT: 21 % — ABNORMAL LOW (ref 36.0–46.0)
HCT: 27.6 % — ABNORMAL LOW (ref 36.0–46.0)
Hemoglobin: 6.1 g/dL — CL (ref 12.0–15.0)
MCHC: 29 g/dL — ABNORMAL LOW (ref 30.0–36.0)
MCHC: 31.9 g/dL (ref 30.0–36.0)
MCHC: 32.4 g/dL (ref 30.0–36.0)
MCV: 65.3 fL — ABNORMAL LOW (ref 78.0–100.0)
Platelets: 275 10*3/uL (ref 150–400)
Platelets: 278 10*3/uL (ref 150–400)
Platelets: 323 10*3/uL (ref 150–400)
RDW: 21.4 % — ABNORMAL HIGH (ref 11.5–15.5)
RDW: 28.7 % — ABNORMAL HIGH (ref 11.5–15.5)
RDW: 29 % — ABNORMAL HIGH (ref 11.5–15.5)

## 2011-02-21 LAB — BASIC METABOLIC PANEL
BUN: 6 mg/dL (ref 6–23)
BUN: 6 mg/dL (ref 6–23)
BUN: 8 mg/dL (ref 6–23)
CO2: 26 mEq/L (ref 19–32)
CO2: 29 mEq/L (ref 19–32)
Calcium: 8.8 mg/dL (ref 8.4–10.5)
Chloride: 111 mEq/L (ref 96–112)
GFR calc non Af Amer: >60 mL/min (ref >60)
GFR calc non Af Amer: >60 mL/min (ref >60)
Glucose, Bld: 56 mg/dL — ABNORMAL LOW (ref 70–99)
Glucose, Bld: 84 mg/dL (ref 70–99)
Potassium: 3.2 mEq/L — ABNORMAL LOW (ref 3.5–5.1)
Potassium: 3.4 mEq/L — ABNORMAL LOW (ref 3.5–5.1)
Sodium: 142 mEq/L (ref 135–145)
Sodium: 143 mEq/L (ref 135–145)

## 2011-02-21 LAB — DIFFERENTIAL
Band Neutrophils: 0 % (ref 0–10)
Basophils Absolute: 0 10*3/uL (ref 0.0–0.1)
Basophils Absolute: 0 10*3/uL (ref 0.0–0.1)
Basophils Absolute: 0.1 10*3/uL (ref 0.0–0.1)
Basophils Relative: 0 % (ref 0–1)
Eosinophils Absolute: 0.1 10*3/uL (ref 0.0–0.7)
Eosinophils Absolute: 0.2 10*3/uL (ref 0.0–0.7)
Eosinophils Relative: 2 % (ref 0–5)
Lymphocytes Relative: 18 % (ref 12–46)
Lymphocytes Relative: 32 % (ref 12–46)
Lymphs Abs: 1.5 10*3/uL (ref 0.7–4.0)
Lymphs Abs: 1.6 10*3/uL (ref 0.7–4.0)
Metamyelocytes Relative: 0 %
Monocytes Absolute: 0.5 10*3/uL (ref 0.1–1.0)
Myelocytes: 0 %
Neutro Abs: 2.7 10*3/uL (ref 1.7–7.7)
Neutro Abs: 3.6 10*3/uL (ref 1.7–7.7)
Neutro Abs: 6 10*3/uL (ref 1.7–7.7)

## 2011-02-21 LAB — URINALYSIS, ROUTINE W REFLEX MICROSCOPIC
Ketones, ur: NEGATIVE mg/dL
Nitrite: NEGATIVE
Protein, ur: NEGATIVE mg/dL
pH: 6 (ref 5.0–8.0)

## 2011-02-21 LAB — CROSSMATCH

## 2011-02-21 LAB — FOLATE: Folate: 16.1 ng/mL

## 2011-02-21 LAB — HEMOCCULT GUIAC POC 1CARD (OFFICE)
Fecal Occult Bld: NEGATIVE
Fecal Occult Bld: NEGATIVE
Fecal Occult Bld: NEGATIVE

## 2011-02-21 LAB — HEMOGLOBIN AND HEMATOCRIT, BLOOD
HCT: 27.7 % — ABNORMAL LOW (ref 36.0–46.0)
HCT: 31.7 % — ABNORMAL LOW (ref 36.0–46.0)
Hemoglobin: 10.2 g/dL — ABNORMAL LOW (ref 12.0–15.0)
Hemoglobin: 8.7 g/dL — ABNORMAL LOW (ref 12.0–15.0)

## 2011-02-21 LAB — PROTIME-INR: Prothrombin Time: 13.4 seconds (ref 11.6–15.2)

## 2011-02-21 LAB — IRON AND TIBC: Saturation Ratios: 3 % — ABNORMAL LOW (ref 20–55)

## 2011-02-21 LAB — RETICULOCYTES: Retic Ct Pct: 1.9 % (ref 0.4–3.1)

## 2011-02-28 ENCOUNTER — Encounter (HOSPITAL_COMMUNITY): Payer: Medicare Other | Attending: Oncology

## 2011-02-28 DIAGNOSIS — D649 Anemia, unspecified: Secondary | ICD-10-CM

## 2011-02-28 DIAGNOSIS — D509 Iron deficiency anemia, unspecified: Secondary | ICD-10-CM | POA: Insufficient documentation

## 2011-02-28 DIAGNOSIS — Z79899 Other long term (current) drug therapy: Secondary | ICD-10-CM | POA: Insufficient documentation

## 2011-03-02 ENCOUNTER — Encounter (HOSPITAL_COMMUNITY): Payer: Medicare Other | Admitting: Oncology

## 2011-03-02 DIAGNOSIS — D509 Iron deficiency anemia, unspecified: Secondary | ICD-10-CM

## 2011-03-29 NOTE — Consult Note (Signed)
NAME:  Bartosiewicz, Lielle                  ACCOUNT NO.:  [id number]   MEDICAL RECORD NO.:  [id number]          PATIENT TYPE:  INP   LOCATION:  A328                          FACILITY:  [facility]   PHYSICIAN:  Kassie Mends, M.D.      DATE OF BIRTH:  1941-03-27   DATE OF CONSULTATION:  05/10/2009  DATE OF DISCHARGE:                                 CONSULTATION   REFERRING PHYSICIAN:  Melissa L. Ladona Ridgel, M.D.   REASON FOR CONSULTATION:  Iron deficiency anemia.   HISTORY OF PRESENT ILLNESS:  Mrs. Lingelbach is a 70 year old female who was  diagnosed with B12 deficiency in [place].  She denies ever taking iron  supplements.  She reports having an EGD and colonoscopy by Dr. Leona Carry in  [place].  Those procedures were less than 5 years ago.  She had a  hiatal hernia and hemorrhoids.  She denies bright red blood per rectum,  hematemesis or blood in the urine.  She eats meat.  She does not donate  blood.  Sometimes she has nausea.  She denies any vomiting.  She had  never been an eater and gets full fast.  Her appetite decreased for  the last 2 to 3 months.  She denies any problems swallowing.  Her  heartburn and indigestion are controlled with Nexium once a day.  She  may take Carafate once a day for additional relief.  Over the last 2 to  3 weeks she has used the  Carafate more frequently.  She denies any  weight loss.  Rarely does she have abdominal pain.  She denies any  diarrhea.  She reports change in her bowel habits over the last month.   PAST MEDICAL HISTORY:  1. Gastroesophageal reflux disease.  2. Degenerative disk disease.  3. Vertigo.  4. Hiatal hernia.   PAST SURGICAL HISTORY:  1. Hysterectomy.  2. Appendectomy.  3. Cholecystectomy.   ALLERGIES:  SULFA and LATEX.   MEDICATIONS:  Aspirin, Claritin, Nicoderm, Protonix, Zocor, Carafate as  needed, Tylenol as needed.   FAMILY HISTORY:  She denies any family history of colon cancer or colon  polyps.   SOCIAL HISTORY:  She continues to  smoke a pack a day.  She has a 30 pack-  year history.  She denies any alcohol use.  She is divorced.  She had  two children.  One of them is deceased.   REVIEW OF SYSTEMS:  Per the HPI.  Otherwise all systems are negative.  The patient currently complaining of headache and states that Tylenol is  not relieving her symptoms.   PHYSICAL EXAMINATION:  VITAL SIGNS:  T max 99.3.  Blood pressure 123/77,  heart rate 62, respirations 20.  GENERAL:  She is in no apparent  distress.  Alert and oriented x4.  HEENT:  Atraumatic, normocephalic.  Pupils equal, react to light.  Mouth  has no oral lesions.  Posterior pharynx without erythema or exudate.  NECK:  Full range of motion.  No lymphadenopathy.  LUNGS:  Clear to  auscultation bilaterally.  CARDIOVASCULAR:  Regular rhythm.  No murmur.  Normal S1 and S2.  ABDOMEN:  Bowel sounds present.  Soft, nontender,  nondistended.  No rebound or guarding.  EXTREMITIES:  No cyanosis or  edema.  NEUROLOGIC:  She has no focal neurologic deficits.   LABORATORY DATA:  Hemoglobin 6.1, MCV 65.3, platelets 278,000, INR 1.  Potassium 3.4, creatinine 0.67,  ferritin less than 1.  CT scan of the abdomen and pelvis with IV contrast in 2009 showed no  acute intra-abdominal process.  Chest x-ray on admission showed tortuous  aorta.   ASSESSMENT:  Ms. 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  Differential  diagnosis includes colorectal polyp or malignancy, gastric malignancy.  She may also have arteriovenous malformation. Thank you for allowing me  to see Ms. Deuser in consultation.  My recommendations follow.   RECOMMENDATIONS:  1. Continue Protonix daily.  2. She may use Carafate as needed.  3. Plan colonoscopy followed by an EGD.  If no source for iron      deficiency anemia is identified on either exam,  capsule endoscopy      will be performed.  4. GoLYTELY prep with Dulcolax and tap water  enemas.  Clear liquid      diet and n.p.o. after midnight.  5. She may continue her aspirin.      Kassie Mends, M.D.  Electronically Signed     SM/MEDQ  D:  05/10/2009  T:  05/11/2009  Job:  [id number]

## 2011-03-29 NOTE — Consult Note (Signed)
NAME:  Frede, Darryl                  ACCOUNT NO.:  [id number]   MEDICAL RECORD NO.:  [id number]          PATIENT TYPE:  INP   LOCATION:  A328                          FACILITY:  [facility]   PHYSICIAN:  Gerrit Friends. Dietrich Pates, MD, FACCDATE OF BIRTH:  05/19/1941   DATE OF CONSULTATION:  DATE OF DISCHARGE:  05/12/2009                                 CONSULTATION   REFERRING DOCTOR:  Dr. Ladona Ridgel.   PRIMARY CARE:  [facility].   HISTORY OF PRESENT ILLNESS:  A 70 year old woman who was awakened from  sleep by right arm and chest discomfort and subsequently admitted to  hospital with a severe iron-deficiency anemia.  Ms. Mchale has no prior  cardiovascular history.  She has never been seen by a cardiologist nor  undergone any significant cardiac testing.  Over recent months, she has  noted exercise intolerance with exertional dyspnea.  She has had no  chest discomfort.  The night of admission, she was awakened from sleep  by pain in her right hand with paresthesias.  This passed in  approximately 1 minute; however, she developed anxiety and right chest  discomfort prompting her to call EMS.  In the emergency department, EKG  and cardiac markers were unremarkable.  Her symptoms resolved  spontaneously; however, she was noted to have a microcytic anemia with a  hemoglobin of 6 and was admitted.  Subsequent workup has reportedly  demonstrated AV malformations.  She was transfused to hemoglobin near  10.  Plans are for treatment with oral iron at the present time.  She  has had no recurrent symptoms since hospital admission.   PAST MEDICAL HISTORY:  Otherwise notable for GERD, DJD of the spine,  vertigo, and hyperlipidemia.   PAST SURGICAL HISTORY:  Prior surgeries include hysterectomy,  appendectomy, and cholecystectomy.   ALLERGIES:  She reports allergies to SULFA and LATEX.   MEDICATIONS ON ADMISSION:  Included aspirin, Claritin, NicoDerm,  Protonix, and simvastatin.   FAMILY  HISTORY:  No prominent history of coronary artery disease in her  first-degree relatives.   SOCIAL HISTORY:  Divorced and lives locally;  2 adult children, 1 of  whom is deceased.  She has a 30-pack-year history of cigarette smoking  that continues at one half-one pack per day.   REVIEW OF SYSTEMS:  Notable for fibrocystic disease of both breasts.  She has a history of migraine headache.  She reports arthritic  discomfort in her lower extremities.  Follows a regular diet with stable  weight and appetite.  She has impaired vision requiring corrective  lenses for near and far vision.  She has upper and lower dentures.  She  reports a history of depression and has middle of the night awakening.  All other systems reviewed and are negative.   PHYSICAL EXAMINATION:  GENERAL:  On exam, pleasant well-appearing, trim  woman, in no acute distress.  VITAL SIGNS:  The temperature is 98.3, heart rate 66 and regular,  respirations 20, blood pressure 110/70, O2 sat 95% on room air.  HEENT:  Normal lids and conjunctiva; normal oral mucosa.  NECK:  No jugular venous distention; no carotid bruits.  ENDOCRINE:  No thyromegaly.  HEMATOPOIETIC:  No adenopathy.  LUNGS:  Few inspiratory and expiratory rhonchi.  CARDIAC:  Normal first and second heart sounds; prominent fourth heart  sounds; minimal systolic murmur.  ABDOMEN:  Soft and nontender; normal bowel sounds; no organomegaly; no  bruits; no masses.  EXTREMITIES:  No edema; normal distal pulses.  NEUROLOGIC:  Symmetric strength and tone; normal cranial nerves.   LABORATORY DATA:  Laboratory notable for a hemoglobin of 8.8 when first  measured in hospital, now increased to greater than 10.  MCV was 74.  She had a potassium of 3.4 that has decreased to 3.0.  Cardiac markers  have been negative.  Stool for occult blood was negative.   EKG:  Normal sinus rhythm; minimal nonspecific T-wave abnormality in V2;  no change when compared to a previous  tracing of approximately 1 year  earlier.   Chest x-ray:  Borderline cardiomegaly; tortuous aorta; mild scoliosis.   IMPRESSION:  Ms. Fitzner presents with right arm discomfort that clearly is  not of cardiac origin and subsequent chest discomfort in the setting of  a moderately severe anemia.  While this certainly could have represented  myocardial ischemia related to underlying coronary disease exacerbated  by anemia, she has relatively modest cardiovascular risk factors and  likely does not have coronary disease.  Nonetheless, stress testing is  warranted and a stress echocardiogram can be performed in a few days as  an outpatient.   She has a history of hyperlipidemia with additional cardiovascular risk  factors most notable for cigarette smoking.  We will reexamine her lipid  profile as an outpatient and determine whether continuing treatment with  simvastatin is warranted.   She is strongly encouraged to discontinue cigarette smoking.  She agrees  to make a significant effort to do so.  We will support her in this  following discharge.  Stress testing and office followup will be  arranged.  We greatly appreciate the request for consultation.      Gerrit Friends. Dietrich Pates, MD, [facility]  Electronically Signed     RMR/MEDQ  D:  05/12/2009  T:  05/13/2009  Job:  [id number]

## 2011-03-29 NOTE — H&P (Signed)
NAME:  Melody Mitchell, Melody Mitchell                  ACCOUNT NO.:  [id number]   MEDICAL RECORD NO.:  [id number]          PATIENT TYPE:  INP   LOCATION:  A328                          FACILITY:  [facility]   PHYSICIAN:  Melissa L. Ladona Ridgel, MD  DATE OF BIRTH:  Apr 02, 1941   DATE OF ADMISSION:  05/09/2009  DATE OF DISCHARGE:  LH                              HISTORY & PHYSICAL   PRIMARY CARE PHYSICIAN:  Dr. Ennis Forts at [facility].   CHIEF COMPLAINT:  Pain down her right arm.   HISTORY OF PRESENT ILLNESS:  The patient is a 70 year old, African  American female, who lives by herself.  She was awakened this morning by  a sharp pain in her right arm that went down to her fingers.  She got up  and went to the bathroom, took some water, and had no relief.  Her pain  moved slightly to the right chest and then disappeared.  She describes  it as a sharp twinge, 10/10, was associated with nausea, but no  shortness of breath.  The patient did note, however, that she has been  having increasing shortness of breath with exertion, especially on the  steps to her own apartment.   REVIEW OF SYSTEMS:  Constitutionally:  She had no weight gain.  No fever  or chills.  She has had a decreased appetite with decreased weight.  Eyes: No blurred vision or double vision.  Ear, Nose, and Throat: No  tinnitus, dysphagia, discharge.  CARDIOVASCULAR:  She did describe chest pain as above, but no  palpitations.  Respiratory:  She has been having shortness of breath with walking.  GI:  She has not noticed any blood in her stools or dark stools.  UROLOGICALLY:  She complains of no burning or difficulty urinating, no  hesitancy or frequency.  GI:  When the patient has an upset stomach, she takes her Carafate.  She  does have a GI physician,  in [place].  NEUROLOGICALLY:  The patient has been seeing a neurologist in [place]  for vertigo and taking meclizine daily.  The patient has been using  ibuprofen alternating with  Tylenol for her pain, which is chronic.  The  pain is generally arthritic pain and knee pain as well as lower back  pain.  PSYCHIATRICALLY:  She has no depression or anxiety.   ALLERGIES:  1. LATEX.  2. SULFA.   CURRENT MEDICATIONS:  1. Aspirin 81 mg daily.  2. Meclizine 25 mg as needed 3 times daily.  3. Nexium 40 mg once daily.  4. Pravastatin 40 mg at h.s.  5. Zyrtec 10 mg once daily.  6. Carafate 10 mg once daily.  7. Carafate 1 gm in 10 mL q.i.d.   PAST MEDICAL HISTORY:  1. Hypercholesterolemia.  2. Vertigo, which is chronic.  3. Arthritis pain.  4. She denies diabetes.  5. 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  6. Reflux.  7. Hemorrhoids.  8. Hiatal hernia.   PAST SURGICAL HISTORY:  Cholecystectomy, hysterectomy, and a fibroid  tumor repair.   SOCIAL  HISTORY:  She still is a 1 pack a day smoker.  She does not drink  any alcohol.  She used to work for the [employer] in  [place]. and also for [employer] here locally and the [employer]  She is divorced.  She has 1 son, who is living and 1 son, who  is deceased.  Contact person is her sister, Starla Link, [phone].  The patient desires a full code status at this time.   PHYSICAL EXAMINATION:  VITAL SIGNS:  Temperature is 98.3, blood pressure  111/59, pulse 73, respirations 18, saturation 100%. GENERAL:  She is in  no acute distress.  HEENT:  She does have alopecia and uses a wig.  She has slight  proptosis; otherwise, her pupils are equal, round, and reactive to  light.  Extraocular muscles are intact.  She has anicteric sclerae.  Examination of the ears revealed the tympanic membranes are occluded  bilaterally by cerumen; otherwise, her nose is midline.  Septum is  nondeviated.  She has no discharge.  Examination of the mouth reveals  dentures with no oral lesions or lip lesions.  NECK:  Supple.  There is no JVD, no lymph nodes, no carotid bruit.  CHEST:  Clear to  auscultation with slight JVD.  CARDIOVASCULAR:  Regular rate/rhythm, positive S1 S2.  I do not  appreciate a murmur, rub, or gallop.  There is no heave or thrill, and  no displacement of her apical impulse.  ABDOMEN:  Soft, nontender, and nondistended with positive bowel sounds.  There is no hepatosplenomegaly, no guarding, no rebound.  There are no  bruits.  EXTREMITIES:  No clubbing, cyanosis, or edema.  NEUROLOGICAL:  She is awake, alert, oriented.  Cranial nerves II-XII are  intact.  Power is 5/5.  DTRs are 2+.  Plantars are downgoing.  PSYCHIATRIC:  Affect is appropriate.  Recent and remote memory are  intact.  Judgment and insight are intact.   PERTINENT LABORATORIES:  Her chest x-ray is completely negative.  She  has no infiltration.   Point-of-care enzymes revealed a negative troponin and a low myoglobin  and x2.  Her sodium is 142, potassium 3.2, chloride 111, CO2 is 26, BUN  was 86, glucose 84, creatinine 0.63, calcium 8.9.  Her WBC count is 5.6  with a hemoglobin of 6.1, hematocrit 21, and platelets of 323.  Her  urinalysis is within normal limits without evidence for infection.  Her  PTT was 29, INR 1.0, and PT of 13.4.  EKG was normal sinus rhythm  without acute ST T-wave changes.   ASSESSMENT AND PLAN:  This is a 70 year old, African American female,  who presents with atypical right-sided chest pain in the face of finding  a hemoglobin of 6.  The patient will be admitted to telemetry and  transfuse.  1. Chest pain:  Serial cardiac markers will be obtained.  A Cardiology      evaluation will be requested early next week.  I do not think she      needs to have one this evening unless she rules in for a myocardial      infarction.  She can have 1 dose of low-dose aspirin this evening,      however, long-term aspirin use at this time is probably not      appropriate since she may have an upper gastrointestinal bleed.  2. Pulmonary:  Chest x-ray is negative.  3.  Gastrointestinal:  I will place her  on a proton pump inhibitor      since her examination is negative per the emergency room physician.      I will make that p.o.  Consult Gastroenterology for      esophagogastroduodenoscopy (EGD) and possible colonoscopy.  4. Genitourinary:  No complaints.  Her urinalysis negative.  5. Endocrine:  Mild proptosis.  I will check a TSH.  6. Neurological:  She has chronic vertigo.  We will continue her      meclizine.   Total time on this case was from 12:35 to 1:35 p.m.      Melissa L. Ladona Ridgel, MD  Electronically Signed     MLT/MEDQ  D:  05/10/2009  T:  05/10/2009  Job:  [id number]   cc:   Starr Sinclair. Shelva Majestic, M.D.  [facility]  [address] [place], [place] [address]

## 2011-03-29 NOTE — Group Therapy Note (Signed)
NAME:  Melody Mitchell, Melody Mitchell                  ACCOUNT NO.:  [id number]   MEDICAL RECORD NO.:  [id number]          PATIENT TYPE:  INP   LOCATION:  A328                          FACILITY:  [facility]   PHYSICIAN:  Melissa L. Ladona Ridgel, MD  DATE OF BIRTH:  February 17, 1941   DATE OF PROCEDURE:  05/10/2009  DATE OF DISCHARGE:                                 PROGRESS NOTE   The patient overnight did well.  She received 2 units of packed red  blood cells which brought her hemoglobin up to 8.8.  She denies any  nausea, vomiting or abdominal pain.  She has had no further chest pain  overnight.  She does, however, have a headache and complains of being  stuffy.   VITAL SIGNS:  Temperature T-max was 99.3.  This morning 99.3.  Blood  pressure 123/77, pulse 62, respirations 20, saturation 97% on room air.  Her intake and output have been adequate with several voids and 1 stool.   PHYSICAL EXAM:  GENERAL:  This is a well-developed, well-nourished  [place] female in no acute distress.  She is normocephalic,  atraumatic.  HEENT: Pupils equal, round, reactive to light.  Extraocular muscles  intact.  Mucous membranes are moist.  She has dentures.  Neck is supple.  I do not appreciate any JVD. No lymphadenopathy and no carotid bruits.  Examination of nose reveals septum midline but she is bit stuffy  sounding.  CHEST:  Decreased but clear to auscultation.  There are no rhonchi,  rales, or wheezes.  CARDIOVASCULAR:  Regular rate and rhythm.  Positive S1 and S2.  No S3 or  S4. No murmurs, rubs or gallops.  I do not appreciate any heaves or  thrills. There is a nondisplaced apical impulse.  ABDOMEN:  Soft, nontender, nondistended with positive bowel sounds.  There is no hepatosplenomegaly and no guarding.  No rebound.  EXTREMITIES:  No edema.  No joint deformities, no warmth.  NEUROLOGICAL:  Cranial nerves II-XII are intact.  Power is 5/5.  DTRs  are 2+.  Plantars are downgoing.  PSYCHIATRIC:  The patient is very  soft spoken, a little bit flat affect  but otherwise, judgment and insight are intact.  Recent and remote  memory are intact.   PERTINENT LABORATORIES:  Her anemia panel shows an RBC count of 3.22  with a reticulocyte count of 61.2, total iron of 10 which is slow, and  percent saturation is also low at 3. Folate is 16.1 with a B12 of 527  and a ferritin of less than 1.  Her cardiac markers have all been  negative.  Her CBC this morning white count 5, hemoglobin 8.8,  hematocrit 27.6 and platelets of 278.  Her sodium is 143, potassium was  3.4, chloride 113, CO2 is 27, BUN is 8, creatinine 0.6, glucose was 90.  Fecal occult testing was completely negative.   ASSESSMENT/PLAN:  The patient is a pleasant 70 year old female who came  in with chest pain, found to have a hemoglobin of 6 felt to be chronic  loss, since has no evidence for acute  losses. Her hemoccult is negative.  She has not had any vomiting. I appreciate Dr. Cira Servant' consult. Patient  is planned to go for an EGD in the morning.   1. Chest pain.  The patient is ruled out on serial cardiac markers for      any occult myocardial infarction.  However, she likely should have      a stress test after her blood levels are corrected. At this time I      will DC the aspirin 81 mg as that may be complicating her GI      condition and she has not truly ruled in for a cardiac condition.  2. Acute anemia likely chronically slowly losing since she has a      negative hemoccult.  GI plans to scope her in the morning and      colonoscopy and EGD.  We will give her 1 more unit of blood since      she is still a little bit low this evening.  3. Headache.  The patient states that sometimes she will take Percocet      for that. I will go ahead and order that.  4. A stuffy nose. Will put her back on her Flonase.  5. The patient is having difficulty with her sleep, so we will allow      to have an Ambien.  However, the patient is due for a bowel  prep      tonight, and it probably would not be the greatest idea to have her      sleepy such that she falls. So I will ask her to please bear with      me and we will allow her to have Ambien after her study tomorrow.  6. DVT prophylaxis at this time is with SCDs.  7. Will continue her Protonix which has been switched to IV and she is      going to the n.p.o. tonight.  8. Meclizine 25 mg is available if she is dizzy.  9. Hyperlipidemia.  She remains on her statin.   Total time on her case was 25 minutes.      Melissa L. Ladona Ridgel, MD  Electronically Signed     MLT/MEDQ  D:  05/10/2009  T:  05/10/2009  Job:  [id number]

## 2011-03-29 NOTE — H&P (Signed)
NAME:  Melody Mitchell, Melody Mitchell                  ACCOUNT NO.:  [id number]   MEDICAL RECORD NO.:  [id number]          PATIENT TYPE:  INP   LOCATION:  A316                          FACILITY:  [facility]   PHYSICIAN:  Lonia Blood, M.D.      DATE OF BIRTH:  Jan 31, 1941   DATE OF ADMISSION:  12/25/2008  DATE OF DISCHARGE:  LH                              HISTORY & PHYSICAL   PRIMARY CARE PHYSICIAN:  Dr. Sherwood Gambler.   PRESENTING COMPLAINT:  Dizziness and headache.   HISTORY OF PRESENT ILLNESS:  The patient is a 70 year old female with  history recurrent vertigo, hiatal hernia, and acid reflux as well as B12  deficiency who apparently has had mastoiditis.  She was seen and treated  with antibiotics a couple of weeks ago.  The patient has continued to  have problems even after the ENT service.  Her problems have been mainly  vertigo, nausea, and vomiting.  She came to the emergency room today  complaining of recurrence of her symptoms.  She was having severe  headache and feeling like her eyes are dancing around.  She also has  some nausea associated with it.  Denied any fever.  No weakness.  No  diarrhea.  No abdominal pain.   PAST MEDICAL HISTORY:  Significant for her B12 deficiency, mastoiditis.  Also otitis media.   ALLERGIES:  She has she is allergic to SULFA and LATEX.   MEDICATIONS:  1. Cobalamine 100 mcg daily.  2. Aspirin 81 mg daily.  3. Cetazone 10 mg daily.  4. Meclizine 25 mg t.i.d. p.r.n.  5. Fluticasone propionate 2 sprays each nostril daily.  6. Carafate 2 teaspoons daily.  7. Nexium.  8. Omeprazole 20 mg daily.   SOCIAL HISTORY:  The patient lives in [place].  She does her ADLs  well.  Denied any tobacco, alcohol or IV drug use.   FAMILY HISTORY:  Noncontributory.   REVIEW OF SYSTEMS:  A 12-point review of systems is negative except per  HPI.   EXAM:  VITAL SIGNS:  Temperature is 98.7, blood pressure 108/70 with a  pulse of 91, respiratory rate 16, sats 98% on room air.  GENERAL:   The patient is awake, alert, oriented in no acute distress.  HEENT:  PERRL.  EOMI.  NECK:  Supple.  No JVD, no lymphadenopathy.  RESPIRATORY:  She has good air entry bilaterally.  No wheeze.  CARDIOVASCULAR:  S1, S2 no murmur.  ABDOMEN:  Soft, nontender with positive bowel sounds.  EXTREMITIES:  No edema, cyanosis or clubbing.   LABS:  Currently pending.  Head CT without contrast showed mild age-  related atrophy, no focal or acute intracranial abnormalities.  There  was some fluid in the mastoid air cells on the left also present on the  MRI from last year.  That MRI showed inflammatory changes throughout the  left mastoid air cells suggestive of an infectious mastoiditis.  No mass  identified.  No sigmoid sinus thrombosis.  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   ASSESSMENT:  Therefore, this  is a 70 year old female presenting with  recurrent vertigo, nausea, and vomiting.  From all indications this is  not central vertigo rather it is peripheral involving her left mastoid  area.  She has had antibiotics before and seems to be on current  medications.  The patient complained of feeling fullness in the left  forehead and feeling like she had fluid in the left ear.Marland Kitchen   PLAN:  1. Vertigo.  Will admit the patient and get PT/OT.  Keep on the      meclizine.  Decongestants.  I will add some antibiotics again for      mastoiditis.  If needed, will get ENT surgeons.  MRI could be      repeated if the patient's symptoms have worsened.  2. GERD.  I will continue PPI while she is in the hospital.  3. Tobacco abuse.  The patient to be counseled about taking tobacco.  4. History of B12 deficiency.  Again, the patient will continue with      her home therapy.  Otherwise further treatment will depend on the      patient's response to initial measures in the hospital.      Lonia Blood, M.D.  Electronically Signed     LG/MEDQ  D:  12/26/2008  T:   12/26/2008  Job:  [id number]

## 2011-03-29 NOTE — Discharge Summary (Signed)
NAME:  Melody Mitchell, Melody Mitchell                  ACCOUNT NO.:  [id number]   MEDICAL RECORD NO.:  [id number]          PATIENT TYPE:  INP   LOCATION:  A328                          FACILITY:  [facility]   PHYSICIAN:  Melissa L. Ladona Ridgel, MD  DATE OF BIRTH:  [birth year]   DATE OF ADMISSION:  05/09/2009  DATE OF DISCHARGE:  06/29/2010LH                               DISCHARGE SUMMARY   DISCHARGING DIAGNOSES:  1. Chest pain, which appears to be atypical and not following the      pattern for cardiac symptoms, however, in light of her risk factors      Dr. Dietrich Pates has determined that they will do an outpatient stress      test.  She has been instructed that the office will call her to set      up an appointment.  2. Anemia likely acute on chronic.  The patient was found in the      emergency room to have a hemoglobin of 6.  She underwent      transfusion of several units of blood and was seen and evaluated by      Gastroenterology.  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  There      were 2 additionally arteriovenous malformations in the secondary      portion of the duodenum but had no evidence of bleeding.  She has a      tortuous colon.  She has internal hemorrhoids.  She has patchy      erythema of the atrium without eversion or ulceration.  Biopsies      have been obtained for Helicobacter (H) pylori.  She also had a      Schatzki's ring, which was dilated with advancing of the scope.      There was a small amount of bleeding related to this.  She has no      obvious Barrett's or mass or ulcerations.  The patient will follow      up with Dr. Loreta Ave in 2 months.  3. Chronic vertigo.  The patient has been on p.r.n. Meclizine and      follows with a neurologist in [place].  We have continued that.  4. Tobacco abuse.  The patient has been counseled on cessation and was   provided with a Nicoderm patch here in the hospital.  5. Gastroesophageal reflux disease confirmed with gastritis on      examination.  She will continue on Nexium 40 mg daily.  6. Degenerative disk disease.  I provided the patient with a small      prescription for Percocet for her pain since she cannot take      nonsteroidal antiinflammatory drugs (NSAIDs).   MEDICATIONS AT THE TIME OF DISCHARGE:  1. Percocet 5/225 mg 1 tablet every 6 hours as needed for pain.  2. Ambien-CR 5 mg at bedtime as needed for sleep.  3. Nu-Iron 150 mg 1 tab twice daily.  4. No aspirin, Aleve, Advil, or Goody's.  5. Nexium 40 mg daily.  6. Pravastatin 40 mg daily.  7. Zyrtec 10 mg daily.  8. Carafate 1 g in 10 mL 4 times daily.  9. Fluticasone 2 sprays each nostril daily.  10.Vitamin B12 shots weekly.  11.Vitamin D 5000 units once weekly.  12.Meclizine 25 mg 3 times daily as previously prescribed.   CONSULTATIONS:  Kassie Mends, MD, and Carver Bing, MD.   HOSPITAL COURSE:  The patient is a very pleasant, 70 year old female,  who was independently living at home, who presented to the emergency  room when she developed the acute onset of pain in her right arm and  chest that awakened her from sleep.  The patient came to the emergency  room and the pain in her chest increased.  She was found to have a  hemoglobin of 6.  The patient was admitted to the telemetry floor.  She  was transfused approximately 3 units of packed red blood cells.  She  underwent evaluation by GI by EGD and colonoscopy and was found to have  multiple AVMs and gastritis.  The patient was instructed on avoiding  NSAIDs, and biopsies are pending, which can be followed up as an  outpatient.   Her hospital course was unremarkable.  She did not rule in for  myocardial infarction.  She had no EKG changes.  Recommendations have  been made for an outpatient cardiac stress test based on her risk  factors and the fact that under stress  had developed the anterior chest  discomfort.   DISCHARGE PHYSICAL EXAMINATION:  VITAL SIGNS:  On the day of discharge,  the patient was clinically well without any new complaints and no  complaints of chest pain.  Her temperature was 98.3, blood pressure  108/70, pulse was 66, respirations were 20, saturation was 95%.  Her  intakes and outputs for the day revealed 3 voids and no stools.  GENERAL:  The patient is a well-developed, well-nourished, African  American female in no acute distress.  Her affect is slightly flattened.  HEENT:  She is normocephalic, atraumatic.  Pupils equal, round, and  reactive to light.  Extraocular muscles are intact.  Mucous membranes  are moist.  The patient does have some alopecia and wears a wig.  NECK:  Supple.  There is no JVD, no lymph nodes, and no carotid bruits.  CHEST:  Decreased but clear to auscultation.  There is no rhonchi,  rales, or wheezes.  CARDIOVASCULAR:  Regular rate/rhythm, positive S1 S2, no S3 S4, no  murmurs, rubs, or gallops.  ABDOMEN:  Soft, nontender, and nondistended with positive bowel sounds.  There is no hepatosplenomegaly, no guarding, no rebound.  EXTREMITIES:  No clubbing, cyanosis, or edema.   Pertinent laboratories during the course of this hospital stay reveal a  discharging sodium of 142, her potassium was 3.0 this morning, but I  repleted that orally with 40 of potassium, her BUN is 6, creatinine  0.73, and her glucose was slightly low this morning at 56.  Her CBC  revealed a white count of 8.3, hemoglobin 10.3, hematocrit 31.8, and  platelets of 275.  She has negative cardiac markers.  TSH is 0.957.  Fecal occult testing was negative throughout the hospital course.  Her  anemia panel revealed a reticulocyte count of 61.2, a total iron of 10,  and a total iron binding capacity of 396.  B12 was 527, and her folate  level was 16.1  with a ferritin of less than 1.   Her EKG in the emergency room revealed a normal sinus  rhythm with no ST  T-wave changes, and she has been normal sinus rhythm throughout the  hospital stay without any dysrhythmias.   At this time, the patient is deemed stable for discharge to follow up as  an outpatient with Cardiology and Dr. Cira Servant as well as [facility] in 1 to 2 weeks.   DISPOSITION:  To home.   CONDITION:  Stable.   Full instructions have been provided to the patient on when to contact  her physician, and smoking cessation counseling has been provided.   Total time of this discharge was less than 30 minutes.      Melissa L. Ladona Ridgel, MD  Electronically Signed     MLT/MEDQ  D:  05/12/2009  T:  05/12/2009  Job:  [id number]   cc:   [facility]  [address]  [place], [place] [address]

## 2011-03-29 NOTE — Op Note (Signed)
NAME:  Overall, Melody Mitchell                  ACCOUNT NO.:  [id number]   MEDICAL RECORD NO.:  [id number]          PATIENT TYPE:  INP   LOCATION:  A328                          FACILITY:  [facility]   PHYSICIAN:  Kassie Mends, M.D.      DATE OF BIRTH:  1940/12/15   DATE OF PROCEDURE:  05/11/2009  DATE OF DISCHARGE:                               OPERATIVE REPORT   PRIMARY PHYSICIAN:  Ennis Forts in the office of Dr. Reynolds Bowl.   PROCEDURE:  1. Esophagogastroduodenoscopy with cold forceps biopsies of the      gastric mucosa.  2. Colonoscopy with cold forceps polypectomy.   INDICATION FOR EXAM:  Melody Mitchell is a 70 year old female who presented  with profound iron-deficiency anemia while taking aspirin.  Her  hemoglobin of 6.1 with MCV of 65.3 and a ferritin of less than 1.   FINDINGS:  1. Patent Schatzki's ring which dilated with advancing the scope.  A      small amount of bleeding noted.  Otherwise no Barrett's, mass,      erosion or ulceration.  2. Patchy erythema in the antrum without erosion or ulceration.      Biopsies obtained via cold forceps to evaluate for H. pylori      gastritis or atrophic gastritis.  3. Two small arteriovenous malformations noted in the duodenal bulb.      No active bleeding.  Additionally two AVMs noted in the second      portion of the duodenum without evidence of active bleeding.  4. Slightly tortuous colon.  Polypoid appearing lesion in biopsied in      the ascending colon.  A 3-mm sessile rectal polyp removed via cold      forceps.  Otherwise no evidence of masses, inflammatory changes,      diverticula, or AVMs in the colon.  5. Small internal hemorrhoids.  Otherwise normal retroflexed view of      the rectum.   DIAGNOSIS:  Iron deficiency anemia with heme negative stools most likely  secondary to blood loss from small bowel arteriovenous malformations  while on aspirin.   RECOMMENDATIONS:  1. She should avoid aspirin indefinitely.  Add Nu-Iron 100 mg twice  a      day for at least 3 months.  2. Follow up in 2 months with Dr. Cira Servant to recheck her hemoglobin.  3. Continue b.i.d. PPI while in the hospital and the patient may      continue her outpatient regimen of Nexium and Carafate.  4. Will await biopsies.  5. Screening colonoscopy in 10-15 years if the benefits outweigh the      risks.  6. If she continues to have problems with transfusion dependent      anemia, then could refer her for double balloon enteroscopy to have      the AVM ablated and/or IV Fe.  She chose to conservatively manage      her AVMs at this point since she has heme-negative stools and she      has had appropriate transfusion response.   MEDICATIONS:  1. Demerol  50 mg IV.  2. Versed 5 mg IV.   PROCEDURE TECHNIQUE:  Physical exam was performed.  Informed consent was  obtained from the patient explaining benefits, risks and alternatives to  the procedure.  The patient was connected to monitor and placed in left  lateral position.  Continuous oxygen was provided by nasal cannula and  IV medicine administered through an indwelling cannula.  After  administration of sedation, rectal exam, the patient's rectum intubated.  Scope advanced under direct visualization to the cecum.  The scope was  removed slowly by carefully examining the color, texture, anatomy and  integrity mucosa on the way out.  Unable to intubate the distal terminal  ileum.   After the colonoscopy, the patient's esophagus was intubated with the  diagnostic gastroscope and the scope was advanced under direct  visualization to the second portion of the duodenum.  The scope was  removed slowly by carefully examining the color, texture, anatomy and  integrity mucosa on the way out.  The patient was recovered in endoscopy  and discharged to the floor in satisfactory condition.   PATH:  POLYPOID MUCOSA. MILD GASTRITIS.      Kassie Mends, M.D.  Electronically Signed     SM/MEDQ  D:  05/11/2009  T:   05/11/2009  Job:  [id number]

## 2011-03-29 NOTE — Group Therapy Note (Signed)
NAME:  Melody Mitchell, Melody Mitchell                  ACCOUNT NO.:  [id number]   MEDICAL RECORD NO.:  [id number]          PATIENT TYPE:  INP   LOCATION:  A328                          FACILITY:  [facility]   PHYSICIAN:  Melissa L. Ladona Ridgel, MD  DATE OF BIRTH:  Nov 22, 1940   DATE OF PROCEDURE:  05/11/2009  DATE OF DISCHARGE:                                 PROGRESS NOTE   SUBJECTIVE:  The patient was seen after her EGD this afternoon.  She  seemed to be doing okay and actually was saying that she was hungry.  I  noted the findings of AVMs on her EGD, and the orders placed on the  patient's behalf by Gastroenterology.  The patient describes no chest  pain, no shortness of breath.  She said she has not been nauseated; no  vomiting and diarrhea.   VITAL SIGNS:  Temperature 98.5, blood pressure 126/79, pulse 60,  respirations 18, saturation 98%.  Taking out +24 hours reveals a total  output of 800 and multiple stools after colonoscopy prep.  GENERAL:  This is a very pleasant elderly African American female, in no  acute distress.  She is normocephalic, atraumatic.  Pupils equal, round and reactive to  light.  Extraocular muscles intact.  She has anicteric sclerae.  Examination of nose reveals septum midline without discharge.  Examination of mouth revealed dentures with no oral lesions or lip  lesions.  NECK:  Supple.  There is no JVD.  No lymph nodes.  No carotid bruits.  CHEST:  Clear to auscultation.  There is no rhonchi, rales or wheezes.  CARDIOVASCULAR:  Regular rate and rhythm.  Positive S1 and S2.  No S3 or  S4.  No indication of a murmur, rub or gallop.  ABDOMEN:  Soft, nontender, nondistended with positive bowel sounds.  There is no hepatosplenomegaly; no guarding or rebound.  EXTREMITIES:  Show no edema, clubbing or cyanosis.  Cranial nerves II-XII are intact.  Power signs are 5.  DTRs are 2+.  Plantars are downgoing.   PERTINENT LABORATORY VALUES:  Her cardiac panel has been negative.  TSH  is  within normal limits at 0.957.   ASSESSMENT AND PLAN:  This is a pleasant 70 year old African American  female who presented with chest pain and arm pain; and was found to have  a hemoglobin of 6.  She received 3 units of packed red blood cells and  her hemoglobin is 10.2.  She underwent EGD and colonoscopy today.  She  found to have AVMs and she had biopsies of her gastric mucosa and cold  forceps polypectomy.  She has a Schatzki's ring which was dilated.  She  had no evidence of Barrett's esophagus.  It was noted that she had  patchy erythema without ulceration in the interim of stomach, therefore  evaluation for H. pylori was undertaken.  The AVMs were noted in the  duodenal bulb, and there was no active bleeding noted.   Evidently the patient had a polypoid-appearing lesion, that was biopsied  in the ascending colon; and a 3 mm sessile rectal polyp was removed.  The patient is noted to have internal hemorrhoids.  1. Arteriovenous malformations:  Causing an acute on chronic anemia.      The patient should avoid aspirin indefinitely.  Nu Iron was added      twice daily for the next 3 months.  Follow-up can be made with Dr.      Cira Servant for 2 months.  We will continue her proton pump inhibitor      b.i.d. and our outpatient regime of Nexium and Carafate.  Biopsies      will be reviewed with the patient as an outpatient.  A screening      colonoscopy should be done in 10-15 years.  If she continues to      have a transfusion-dependent anemia, then a double balloon      enteroscopy may be done to deplete her AVMs.  I will monitor the      patient's diet overnight and her hemoglobin in the morning.  I      recommend discharge to home if this continued or remained stable.  2. Chest pain:  The patient will need an evaluation by Cardiology.      The question is, should this be done as an outpatient since she      starts recovering from the anemia.  I will contact Cardiology in      the morning  before we elect to discharge to decide whether or not      she should have inpatient testing or not.  3. Neurologically she has chronic vertigo.  Will continue her on her      meclizine.  I will be interested to find out if she actually has      any improvement in her symptoms, once her hemoglobin is corrected.   TOTAL TIME OF THIS CASE:  30 minutes      Melissa L. Ladona Ridgel, MD  Electronically Signed     MLT/MEDQ  D:  05/11/2009  T:  05/11/2009  Job:  [id number]

## 2011-05-04 ENCOUNTER — Emergency Department (HOSPITAL_COMMUNITY)
Admission: EM | Admit: 2011-05-04 | Discharge: 2011-05-04 | Disposition: A | Discharge status: Home / Self Care | Payer: Medicare Other | Attending: Emergency Medicine | Admitting: Emergency Medicine

## 2011-05-04 ENCOUNTER — Emergency Department (HOSPITAL_COMMUNITY): Payer: Medicare Other

## 2011-05-04 DIAGNOSIS — Z882 Allergy status to sulfonamides status: Secondary | ICD-10-CM | POA: Insufficient documentation

## 2011-05-04 DIAGNOSIS — Z91040 Latex allergy status: Secondary | ICD-10-CM | POA: Insufficient documentation

## 2011-05-04 DIAGNOSIS — D509 Iron deficiency anemia, unspecified: Secondary | ICD-10-CM | POA: Insufficient documentation

## 2011-05-04 DIAGNOSIS — K219 Gastro-esophageal reflux disease without esophagitis: Secondary | ICD-10-CM | POA: Insufficient documentation

## 2011-05-04 DIAGNOSIS — E876 Hypokalemia: Secondary | ICD-10-CM | POA: Insufficient documentation

## 2011-05-04 DIAGNOSIS — R110 Nausea: Secondary | ICD-10-CM | POA: Insufficient documentation

## 2011-05-04 LAB — URINALYSIS, ROUTINE W REFLEX MICROSCOPIC
Bilirubin Urine: NEGATIVE
Ketones, ur: NEGATIVE mg/dL
Nitrite: NEGATIVE
Protein, ur: NEGATIVE mg/dL
Urobilinogen, UA: 0.2 mg/dL (ref 0.0–1.0)

## 2011-05-04 LAB — COMPREHENSIVE METABOLIC PANEL
Alkaline Phosphatase: 86 U/L (ref 39–117)
BUN: 7 mg/dL (ref 6–23)
Chloride: 102 mEq/L (ref 96–112)
Creatinine, Ser: 0.67 mg/dL (ref 0.50–1.10)
GFR calc Af Amer: >60 mL/min (ref >60)
Glucose, Bld: 94 mg/dL (ref 70–99)
Potassium: 3.1 mEq/L — ABNORMAL LOW (ref 3.5–5.1)
Total Bilirubin: 0.2 mg/dL — ABNORMAL LOW (ref 0.3–1.2)

## 2011-05-04 LAB — DIFFERENTIAL
Basophils Absolute: 0 10*3/uL (ref 0.0–0.1)
Eosinophils Absolute: 0.1 10*3/uL (ref 0.0–0.7)
Eosinophils Relative: 1 % (ref 0–5)
Lymphocytes Relative: 31 % (ref 12–46)
Monocytes Absolute: 0.3 10*3/uL (ref 0.1–1.0)

## 2011-05-04 LAB — CBC
HCT: 35.6 % — ABNORMAL LOW (ref 36.0–46.0)
MCHC: 32.6 g/dL (ref 30.0–36.0)
MCV: 88.8 fL (ref 78.0–100.0)
Platelets: 311 10*3/uL (ref 150–400)
RDW: 13.9 % (ref 11.5–15.5)
WBC: 4.2 10*3/uL (ref 4.0–10.5)

## 2011-05-04 LAB — LIPASE, BLOOD: Lipase: 64 U/L — ABNORMAL HIGH (ref 11–59)

## 2011-05-04 LAB — CK TOTAL AND CKMB (NOT AT ARMC): CK, MB: 1.7 ng/mL (ref 0.3–4.0)

## 2011-05-09 ENCOUNTER — Other Ambulatory Visit (HOSPITAL_COMMUNITY): Payer: Self-pay | Admitting: Oncology

## 2011-05-09 ENCOUNTER — Encounter (HOSPITAL_COMMUNITY): Payer: Medicare Other | Attending: Oncology

## 2011-05-09 DIAGNOSIS — Z79899 Other long term (current) drug therapy: Secondary | ICD-10-CM | POA: Insufficient documentation

## 2011-05-09 DIAGNOSIS — D649 Anemia, unspecified: Secondary | ICD-10-CM

## 2011-05-09 DIAGNOSIS — D509 Iron deficiency anemia, unspecified: Secondary | ICD-10-CM | POA: Insufficient documentation

## 2011-05-09 LAB — CBC
HCT: 36.9 % (ref 36.0–46.0)
Hemoglobin: 11.8 g/dL — ABNORMAL LOW (ref 12.0–15.0)
MCH: 28.7 pg (ref 26.0–34.0)
MCV: 89.8 fL (ref 78.0–100.0)
RBC: 4.11 MIL/uL (ref 3.87–5.11)

## 2011-05-09 LAB — FERRITIN: Ferritin: 78 ng/mL (ref 10–291)

## 2011-05-11 ENCOUNTER — Encounter (HOSPITAL_COMMUNITY): Payer: Medicare Other | Admitting: Oncology

## 2011-05-11 DIAGNOSIS — D509 Iron deficiency anemia, unspecified: Secondary | ICD-10-CM

## 2011-05-22 ENCOUNTER — Encounter (HOSPITAL_COMMUNITY): Payer: Self-pay | Admitting: Emergency Medicine

## 2011-05-22 ENCOUNTER — Emergency Department (HOSPITAL_COMMUNITY): Payer: Medicare Other

## 2011-05-22 ENCOUNTER — Emergency Department (HOSPITAL_COMMUNITY)
Admission: EM | Admit: 2011-05-22 | Discharge: 2011-05-22 | Disposition: A | Discharge status: Home / Self Care | Payer: Medicare Other | Attending: Emergency Medicine | Admitting: Emergency Medicine

## 2011-05-22 ENCOUNTER — Other Ambulatory Visit: Payer: Self-pay

## 2011-05-22 DIAGNOSIS — K219 Gastro-esophageal reflux disease without esophagitis: Secondary | ICD-10-CM | POA: Insufficient documentation

## 2011-05-22 DIAGNOSIS — R51 Headache: Secondary | ICD-10-CM | POA: Insufficient documentation

## 2011-05-22 DIAGNOSIS — E7800 Pure hypercholesterolemia, unspecified: Secondary | ICD-10-CM | POA: Insufficient documentation

## 2011-05-22 DIAGNOSIS — E785 Hyperlipidemia, unspecified: Secondary | ICD-10-CM | POA: Insufficient documentation

## 2011-05-22 HISTORY — DX: Gastro-esophageal reflux disease without esophagitis: K21.9

## 2011-05-22 HISTORY — DX: Pure hypercholesterolemia, unspecified: E78.00

## 2011-05-22 LAB — BASIC METABOLIC PANEL
BUN: 7 mg/dL (ref 6–23)
CO2: 26 mEq/L (ref 19–32)
Chloride: 104 mEq/L (ref 96–112)
Creatinine, Ser: 0.77 mg/dL (ref 0.50–1.10)
Glucose, Bld: 95 mg/dL (ref 70–99)

## 2011-05-22 LAB — DIFFERENTIAL
Basophils Absolute: 0 10*3/uL (ref 0.0–0.1)
Eosinophils Relative: 1 % (ref 0–5)
Lymphocytes Relative: 34 % (ref 12–46)
Monocytes Absolute: 0.3 10*3/uL (ref 0.1–1.0)

## 2011-05-22 LAB — CBC
HCT: 33.9 % — ABNORMAL LOW (ref 36.0–46.0)
MCV: 88.5 fL (ref 78.0–100.0)
RDW: 14 % (ref 11.5–15.5)
WBC: 5.5 10*3/uL (ref 4.0–10.5)

## 2011-05-22 MED ORDER — SODIUM CHLORIDE 0.9 % IV SOLN
INTRAVENOUS | Status: DC
  Administered 2011-05-22: 15:00:00 via INTRAVENOUS

## 2011-05-22 MED ORDER — HYDROMORPHONE HCL 1 MG/ML IJ SOLN
1.0000 mg | Freq: Once | INTRAMUSCULAR | Status: AC
  Administered 2011-05-22: 1 mg via INTRAVENOUS
  Filled 2011-05-22: qty 1

## 2011-05-22 MED ORDER — ONDANSETRON HCL 4 MG/2ML IJ SOLN
4.0000 mg | Freq: Once | INTRAMUSCULAR | Status: AC
  Administered 2011-05-22: 4 mg via INTRAVENOUS
  Filled 2011-05-22: qty 2

## 2011-05-22 NOTE — ED Provider Notes (Addendum)
History     Chief Complaint  Patient presents with  . Headache  . Neck Injury   Patient is a 70 y.o. female presenting with headaches and neck injury. The history is provided by the patient.  Headache  This is a new problem. The current episode started 1 to 2 hours ago. The problem occurs constantly. The problem has been gradually worsening. Associated with: STARTED AT 0800 WITH SEVERE LEFT SIDED NECK PAIN THAT LASTED SECONDS THEN RESOLVED. FOLLOWED ABOUT 30 MIINUTES LATER BY DIZZINESSNO VERTIGO AND GENERAL WEAKNESS. UPON ARRIVAL TO ED PATIENT STARTED WITH FRONTAL HEADACHE.  The pain is located in the frontal region. The pain is at a severity of 10/10. The pain is severe. The pain does not radiate. Associated symptoms include nausea. Pertinent negatives include no fever, no near-syncope, no palpitations, no syncope, no shortness of breath and no vomiting. She has tried nothing for the symptoms.  Neck Injury Associated symptoms include headaches. Pertinent negatives include no chest pain, no abdominal pain and no shortness of breath.    Past Medical History  Diagnosis Date  . High cholesterol   . Acid reflux     Past Surgical History  Procedure Date  . Abdominal hysterectomy   . Appendectomy   . Cholecystectomy     Family History  Problem Relation Age of Onset  . Heart failure Mother   . Diabetes Sister     History  Substance Use Topics  . Smoking status: Former Games developer  . Smokeless tobacco: Not on file  . Alcohol Use: No    OB History    Grav Para Term Preterm Abortions TAB SAB Ect Mult Living                  Review of Systems  Constitutional: Positive for fatigue. Negative for fever.  HENT: Positive for neck pain. Negative for facial swelling and neck stiffness.   Eyes: Negative for photophobia, pain, redness and visual disturbance.  Respiratory: Negative for cough and shortness of breath.   Cardiovascular: Negative for chest pain, palpitations, syncope and  near-syncope.  Gastrointestinal: Positive for nausea. Negative for vomiting, abdominal pain and diarrhea.  Genitourinary: Negative for dysuria.  Musculoskeletal: Negative for myalgias.  Skin: Negative for rash.  Neurological: Positive for dizziness, weakness and headaches. Negative for numbness.  Hematological: Negative for adenopathy.  Psychiatric/Behavioral: Negative for confusion.    Physical Exam  BP 135/77  Pulse 76  Temp(Src) 98.7 F (37.1 C) (Oral)  Resp 14  Ht 5\' 4"  (1.626 m)  Wt 140 lb (63.504 kg)  BMI 24.03 kg/m2  SpO2 96%  Physical Exam  Constitutional: She is oriented to person, place, and time. She appears well-developed and well-nourished.  HENT:  Head: Normocephalic and atraumatic.  Eyes: EOM are normal. Pupils are equal, round, and reactive to light.  Neck: Normal range of motion. Neck supple.  Cardiovascular: Normal rate, regular rhythm and normal heart sounds.   Pulmonary/Chest: Breath sounds normal.  Abdominal: Soft. There is no tenderness.  Musculoskeletal: Normal range of motion.  Neurological: She is alert and oriented to person, place, and time. No cranial nerve deficit.       NO FOCAL DEFICIT.   Skin: Skin is warm and dry.    ED Course  Date: 05/02/2011   Rate: 63  Rhythm: normal sinus rhythm  QRS Axis: normal  Intervals: normal  ST/T Wave abnormalities: normal  Conduction Disutrbances:none  Narrative Interpretation:   Old EKG Reviewed: none available   Procedures  MDM HEAD CT NEGATIVE AND WITH PAIN MEDS HA COMPLETELY RESOLVED. ETIOLOGY OF LEFT SIDED NECK PAIN AND THEN FRONTAL HA 4 HOURS LATER NOT CLEAR, NO EVIDENCE OF HEAD BLEED.       Shelda Jakes, MD 05/22/11 1610  Shelda Jakes, MD 05/22/11 [phone]

## 2011-05-22 NOTE — ED Notes (Signed)
Pt has expressed a desire to go home.

## 2011-05-22 NOTE — ED Notes (Signed)
Pt c/o sudden onset shooting pain in l neck head at 0730 this am. Pt also c/o feeling weak/dizzy. A and o x 3 . Denies cp/sob. nad noted.

## 2011-05-22 NOTE — ED Notes (Signed)
Pt resting quietly in room with family at bedside.

## 2011-05-22 NOTE — ED Notes (Signed)
Received report.  Pt presently comfortable and eating.  Denies complaints at this time.

## 2011-05-22 NOTE — ED Notes (Signed)
Pt c/o headache pain to left side of neck and weakness. Pt states this started this am.

## 2011-06-05 ENCOUNTER — Emergency Department (HOSPITAL_COMMUNITY)
Admission: EM | Admit: 2011-06-05 | Discharge: 2011-06-05 | Disposition: A | Discharge status: Home / Self Care | Payer: Medicare Other | Attending: Emergency Medicine | Admitting: Emergency Medicine

## 2011-06-05 ENCOUNTER — Encounter (HOSPITAL_COMMUNITY): Payer: Self-pay | Admitting: *Deleted

## 2011-06-05 DIAGNOSIS — N390 Urinary tract infection, site not specified: Secondary | ICD-10-CM | POA: Insufficient documentation

## 2011-06-05 DIAGNOSIS — E7800 Pure hypercholesterolemia, unspecified: Secondary | ICD-10-CM | POA: Insufficient documentation

## 2011-06-05 DIAGNOSIS — K219 Gastro-esophageal reflux disease without esophagitis: Secondary | ICD-10-CM | POA: Insufficient documentation

## 2011-06-05 LAB — URINALYSIS, ROUTINE W REFLEX MICROSCOPIC
Bilirubin Urine: NEGATIVE
Glucose, UA: NEGATIVE mg/dL
Ketones, ur: NEGATIVE mg/dL
pH: 6.5 (ref 5.0–8.0)

## 2011-06-05 LAB — URINE MICROSCOPIC-ADD ON

## 2011-06-05 MED ORDER — ACETAMINOPHEN 500 MG PO TABS
1000.0000 mg | ORAL_TABLET | Freq: Once | ORAL | Status: AC
  Administered 2011-06-05: 1000 mg via ORAL
  Filled 2011-06-05: qty 2

## 2011-06-05 MED ORDER — ACETAMINOPHEN 325 MG PO TABS: 650.0000 mg | ORAL_TABLET | Freq: Once | ORAL | Status: DC

## 2011-06-05 MED ORDER — CEPHALEXIN 500 MG PO CAPS: 500.0000 mg | ORAL_CAPSULE | Freq: Four times a day (QID) | ORAL | Status: AC

## 2011-06-05 NOTE — ED Notes (Signed)
Pt c/o headache. EDP aware. NAD.

## 2011-06-05 NOTE — ED Notes (Signed)
Noticed small amt of blood in underwear and on paper after wiping with urination last night.  Noticed blood again this morning.  Denies GU sx, denies abd pain, c/o increased tightness in abd.  Denies n/v.  C/o "a little constipation this morning."  Regular appetite.

## 2011-06-05 NOTE — ED Notes (Signed)
States h/a is better. Requesting to speak with EDP. EDP aware. nad

## 2011-06-05 NOTE — ED Provider Notes (Signed)
History     Chief Complaint  Patient presents with  . Vaginal Bleeding   Patient is a 70 y.o. female presenting with vaginal bleeding. The history is provided by the patient.  Vaginal Bleeding This is a new problem. The current episode started yesterday. The problem occurs rarely. The problem has not changed since onset.Pertinent negatives include no chest pain, no abdominal pain and no headaches. The symptoms are aggravated by nothing. The symptoms are relieved by nothing. She has tried nothing for the symptoms.    Past Medical History  Diagnosis Date  . High cholesterol   . Acid reflux     Past Surgical History  Procedure Date  . Abdominal hysterectomy   . Appendectomy   . Cholecystectomy     Family History  Problem Relation Age of Onset  . Heart failure Mother   . Diabetes Sister     History  Substance Use Topics  . Smoking status: Former Games developer  . Smokeless tobacco: Not on file  . Alcohol Use: No    OB History    Grav Para Term Preterm Abortions TAB SAB Ect Mult Living                  Review of Systems  Constitutional: Negative for fatigue.  HENT: Negative for congestion, sinus pressure and ear discharge.   Eyes: Negative for discharge.  Respiratory: Negative for cough.   Cardiovascular: Negative for chest pain.  Gastrointestinal: Negative for abdominal pain and diarrhea.  Genitourinary: Positive for vaginal bleeding. Negative for frequency and hematuria.  Musculoskeletal: Negative for back pain.  Skin: Negative for rash.  Neurological: Negative for seizures and headaches.  Hematological: Negative.   Psychiatric/Behavioral: Negative for hallucinations.    Physical Exam  BP 133/94  Pulse 62  Temp(Src) 99.3 F (37.4 C) (Oral)  Resp 16  Ht 5\' 4"  (1.626 m)  Wt 134 lb (60.782 kg)  BMI 23.00 kg/m2  SpO2 98%  Physical Exam  Constitutional: She is oriented to person, place, and time. She appears well-developed.  HENT:  Head: Normocephalic and  atraumatic.  Eyes: Conjunctivae and EOM are normal. No scleral icterus.  Neck: Neck supple. No thyromegaly present.  Cardiovascular: Normal rate and regular rhythm.  Exam reveals no gallop and no friction rub.   No murmur heard. Pulmonary/Chest: No stridor. She has no wheezes. She has no rales. She exhibits no tenderness.  Abdominal: She exhibits no distension. There is no tenderness. There is no rebound.  Genitourinary: Vagina normal.       No bleeding seen on digital examen  Musculoskeletal: Normal range of motion. She exhibits no edema.  Lymphadenopathy:    She has no cervical adenopathy.  Neurological: She is oriented to person, place, and time. Coordination normal.  Skin: No rash noted. No erythema.  Psychiatric: She has a normal mood and affect. Her behavior is normal.   Results for orders placed during the hospital encounter of 06/05/11  URINALYSIS, ROUTINE W REFLEX MICROSCOPIC      Component Value Range   Color, Urine YELLOW  YELLOW    Appearance CLEAR  CLEAR    Specific Gravity, Urine 1.020  1.005 - 1.030    pH 6.5  5.0 - 8.0    Glucose, UA NEGATIVE  NEGATIVE (mg/dL)   Hgb urine dipstick TRACE (*) NEGATIVE    Bilirubin Urine NEGATIVE  NEGATIVE    Ketones, ur NEGATIVE  NEGATIVE (mg/dL)   Protein, ur NEGATIVE  NEGATIVE (mg/dL)   Urobilinogen, UA 0.2  0.0 - 1.0 (mg/dL)   Nitrite NEGATIVE  NEGATIVE    Leukocytes, UA NEGATIVE  NEGATIVE   URINE MICROSCOPIC-ADD ON      Component Value Range   Squamous Epithelial / LPF FEW (*) RARE    WBC, UA 7-10  <3 (WBC/hpf)   RBC / HPF 7-10  <3 (RBC/hpf)   Bacteria, UA RARE  RARE    Ct Head Wo Contrast  05/22/2011  *RADIOLOGY REPORT*  Clinical Data:  Severe left-sided headache.  CT HEAD WITHOUT CONTRAST  Technique: Contiguous axial images were obtained from the base of the skull through the vertex without contrast  Comparison: 02/07/2011  Findings:  There is no evidence of intracranial hemorrhage, brain edema, or other signs of acute  infarction.  There is no evidence of intracranial mass lesion or mass effect.  No abnormal extraaxial fluid collections are identified.  There is no evidence of hydrocephalus, or other significant intracranial abnormality.  No skull abnormality identified.  IMPRESSION: Negative non-contrast head CT.  Original Report Authenticated By: Danae Orleans, M.D.      ED Course  Procedures  MDM Results and plan discussed with pt      Benny Lennert, MD 06/05/11 [phone]

## 2011-06-05 NOTE — ED Notes (Signed)
EDP in talking with pt at this time

## 2011-06-05 NOTE — ED Notes (Signed)
Tylenol given for h/a. Awaiting EDP re eval. No change in status.

## 2011-06-06 LAB — URINE CULTURE: Culture  Setup Time: 201207222026

## 2011-06-20 ENCOUNTER — Encounter (HOSPITAL_COMMUNITY): Payer: Medicare Other | Attending: Oncology

## 2011-06-20 DIAGNOSIS — Z79899 Other long term (current) drug therapy: Secondary | ICD-10-CM | POA: Insufficient documentation

## 2011-06-20 DIAGNOSIS — D509 Iron deficiency anemia, unspecified: Secondary | ICD-10-CM | POA: Insufficient documentation

## 2011-06-20 LAB — BASIC METABOLIC PANEL
CO2: 26 mEq/L (ref 19–32)
Calcium: 9.9 mg/dL (ref 8.4–10.5)
Creatinine, Ser: 0.79 mg/dL (ref 0.50–1.10)
GFR calc non Af Amer: >60 mL/min (ref >60)
Glucose, Bld: 88 mg/dL (ref 70–99)
Sodium: 144 mEq/L (ref 135–145)

## 2011-06-20 LAB — FERRITIN: Ferritin: 42 ng/mL (ref 10–291)

## 2011-06-20 LAB — CBC
MCH: 28.5 pg (ref 26.0–34.0)
MCV: 88.9 fL (ref 78.0–100.0)
Platelets: 321 10*3/uL (ref 150–400)
RDW: 14.3 % (ref 11.5–15.5)
WBC: 4.3 10*3/uL (ref 4.0–10.5)

## 2011-06-20 NOTE — Progress Notes (Signed)
Lands drawn today for cbc,ferr,bmp

## 2011-06-22 ENCOUNTER — Telehealth (HOSPITAL_COMMUNITY): Payer: Self-pay

## 2011-06-22 NOTE — Telephone Encounter (Signed)
Patient called to schedule appointment for [name] infusion.  Appointment given to patient.

## 2011-06-27 ENCOUNTER — Encounter (HOSPITAL_BASED_OUTPATIENT_CLINIC_OR_DEPARTMENT_OTHER): Payer: Medicare Other

## 2011-06-27 VITALS — BP 114/84 | HR 58 | Temp 98.3°F

## 2011-06-27 DIAGNOSIS — D509 Iron deficiency anemia, unspecified: Secondary | ICD-10-CM

## 2011-06-27 MED ORDER — SODIUM CHLORIDE 0.9 % IV SOLN
1020.0000 mg | Freq: Once | INTRAVENOUS | Status: AC
  Administered 2011-06-27: 1020 mg via INTRAVENOUS
  Filled 2011-06-27: qty 34

## 2011-06-27 MED ORDER — SODIUM CHLORIDE 0.9 % IJ SOLN
INTRAMUSCULAR | Status: AC
  Administered 2011-06-27: 10 mL via INTRAVENOUS
  Filled 2011-06-27: qty 10

## 2011-08-01 ENCOUNTER — Encounter (HOSPITAL_COMMUNITY): Payer: Medicare Other | Attending: Oncology

## 2011-08-01 DIAGNOSIS — D509 Iron deficiency anemia, unspecified: Secondary | ICD-10-CM | POA: Insufficient documentation

## 2011-08-01 LAB — CBC
MCH: 28.3 pg (ref 26.0–34.0)
MCHC: 31.5 g/dL (ref 30.0–36.0)
Platelets: 302 10*3/uL (ref 150–400)

## 2011-08-01 LAB — FERRITIN: Ferritin: 374 ng/mL — ABNORMAL HIGH (ref 10–291)

## 2011-08-01 NOTE — Progress Notes (Signed)
Labs drawn today for cbc,ferr 

## 2011-08-08 ENCOUNTER — Encounter (HOSPITAL_BASED_OUTPATIENT_CLINIC_OR_DEPARTMENT_OTHER): Payer: Medicare Other | Admitting: Oncology

## 2011-08-08 ENCOUNTER — Encounter (HOSPITAL_COMMUNITY): Payer: Self-pay | Admitting: Oncology

## 2011-08-08 ENCOUNTER — Telehealth (HOSPITAL_COMMUNITY): Payer: Self-pay | Admitting: Oncology

## 2011-08-08 VITALS — BP 122/85 | HR 86 | Temp 98.4°F | Ht 64.0 in | Wt 142.4 lb

## 2011-08-08 DIAGNOSIS — K625 Hemorrhage of anus and rectum: Secondary | ICD-10-CM

## 2011-08-08 DIAGNOSIS — D509 Iron deficiency anemia, unspecified: Secondary | ICD-10-CM

## 2011-08-08 NOTE — Patient Instructions (Signed)
[facility]  Discharge Instructions  RECOMMENDATIONS MADE BY THE CONSULTANT AND ANY TEST RESULTS WILL BE SENT TO YOUR REFERRING DOCTOR.         SPECIAL INSTRUCTIONS/FOLLOW-UP: Lab work in 2 months and then to see MD in 3 months.   I acknowledge that I have been informed and understand all the instructions given to me and received a copy. I do not have any more questions at this time, but understand that I may call the Specialty Clinic at [facility] at [phone] during business hours should I have any further questions or need assistance in obtaining follow-up care.    __________________________________________  _____________  __________ Signature of Patient or Authorized Representative            Date                   Time    __________________________________________ Nurse's Signature

## 2011-08-08 NOTE — Progress Notes (Signed)
Forest Gleason, MD, MD [address] [place] [address]  1. ANEMIA, IRON DEFICIENCY  CBC, Differential, Basic metabolic panel, Ferritin    CURRENT THERAPY: Intermittent [name] infusion.  Last [name] 1020 mg infusion was on 06/27/2011.  INTERVAL HISTORY: TURNER Melody Mitchell 70 y.o. female returns for  regular  visit for followup of iron deficiency anemia.  The patient reports that she has been having symptoms of vertigo.  This is being followed by PCP.  The patient also reports arthritic pain.  Again this is followed by PCP.  The patient explains that she feels well.  She has not noticed any change since her last [name] infusion.  I personally reviewed and went over laboratory results with the patient.  She knows that her ferritin rose nicely following IV infusion and her Hgb has risen nearly 1 gram since the [name] administration.  The patient asks if she can restart her baby aspirin.  At this time, I have requested that she refrain from aspirin use due to her iron deficiency anemia. She also tells me that she stopped her Lexapro because she forgot what it was for.  One of our nurses explained what the medication is used for.  I went over its indication with the patient as well and she will restart that medication.  The patient admits to some rectal bleeding with straining of bowel movements.  She associates this with hemorrhoids.  She asks if these can be removed.  I suggested she follow-up with her GI specialist to go over those options.  The patient denies any blood in stool, black tarry stool, hematuria, vaginal bleeding, epistaxis, gingival bleeding, bruising, or other indications of blood loss.  Past Medical History  Diagnosis Date  . High cholesterol   . Acid reflux   . Hx: recurrent pneumonia   . Anemia   . History of contact dermatitis     has ANEMIA, IRON DEFICIENCY; HEMORRHOIDS; ARTERIOVENOUS MALFORMATION; GERD; High cholesterol; and Acid reflux on her problem list.     is allergic to latex and sulfonamide derivatives.  Ms. Dickens does not currently have medications on file.  Past Surgical History  Procedure Date  . Abdominal hysterectomy   . Appendectomy   . Cholecystectomy   . Bilateral breast biopsies  benign     Denies any headaches, dizziness, double vision, fevers, chills, night sweats, nausea, vomiting, diarrhea, constipation, chest pain, heart palpitations, shortness of breath, blood in stool, black tarry stool, urinary pain, urinary burning, urinary frequency, hematuria.   PHYSICAL EXAMINATION  ECOG PERFORMANCE STATUS: 0 - Asymptomatic  Filed Vitals:   08/08/11 0923  BP: 122/85  Pulse: 86  Temp: 98.4 F (36.9 C)    GENERAL:alert, no distress, well nourished, well developed, comfortable, cooperative and smiling SKIN: skin color, texture, turgor are normal HEAD: Normocephalic EYES: normal EARS: External ears normal OROPHARYNX:mucous membranes are moist  NECK: supple, no adenopathy, no bruits, thyroid normal size, non-tender, without nodularity, no stridor, non-tender, trachea midline LYMPH:  no palpable lymphadenopathy, no hepatosplenomegaly BREAST:not examined LUNGS: clear to auscultation  HEART: regular rate & rhythm, no murmurs, no gallops, S1 normal and S2 normal ABDOMEN:abdomen soft, non-tender and normal bowel sounds BACK: Back symmetric, no curvature. EXTREMITIES:less then 2 second capillary refill, no joint deformities, effusion, or inflammation, no edema, no skin discoloration, no clubbing, no cyanosis  NEURO: alert & oriented x 3 with fluent speech, no focal motor/sensory deficits, gait normal  LABORATORY DATA: CBC    Component Value Date/Time   WBC 4.2  08/01/2011 0900   RBC 4.20 08/01/2011 0900   HGB 11.9* 08/01/2011 0900   HCT 37.8 08/01/2011 0900   PLT 302 08/01/2011 0900   MCV 90.0 08/01/2011 0900   MCH 28.3 08/01/2011 0900   MCHC 31.5 08/01/2011 0900   RDW 15.9* 08/01/2011 0900   LYMPHSABS 1.9 05/22/2011 1516    MONOABS 0.3 05/22/2011 1516   EOSABS 0.1 05/22/2011 1516   BASOSABS 0.0 05/22/2011 1516    Lab Results  Component Value Date   FERRITIN 374* 08/01/2011      ASSESSMENT:  1. Iron deficiency anemia 2. Rectal bleeding, likely hemorrhoid related.  Encouraged the patient to follow-up with GI.   PLAN:  1. Encouraged the patient to follow-up with GI 2. Patient will get her mammogram performed when she receives her notice. 3. Lab work in 2 months: CBC diff, BMET, Ferritin 4. Return in 3 months for follow-up 5. I personally reviewed and went over laboratory results with the patient.   All questions were answered. The patient knows to call the clinic with any problems, questions or concerns. We can certainly see the patient much sooner if necessary.  The patient and plan discussed with Glenford Peers, MD and he is in agreement with the aforementioned.  More than 50% of the time spent with the patient was utilized for counseling.  Merlinda Wrubel

## 2011-08-11 LAB — DIFFERENTIAL
Eosinophils Absolute: 0.2
Lymphocytes Relative: 22
Lymphs Abs: 1.4
Neutro Abs: 4.3
Neutrophils Relative %: 67

## 2011-08-11 LAB — BASIC METABOLIC PANEL
BUN: 7
Calcium: 9.5
Creatinine, Ser: 0.83
GFR calc Af Amer: >60
GFR calc non Af Amer: >60

## 2011-08-11 LAB — CBC
MCV: 84.8
Platelets: 291
WBC: 6.4

## 2011-08-15 LAB — COMPREHENSIVE METABOLIC PANEL
ALT: 11
AST: 16
CO2: 24
Chloride: 103
Creatinine, Ser: 0.74
GFR calc Af Amer: >60
GFR calc non Af Amer: >60
Total Bilirubin: 0.3

## 2011-08-15 LAB — URINALYSIS, ROUTINE W REFLEX MICROSCOPIC
Bilirubin Urine: NEGATIVE
Glucose, UA: NEGATIVE
Hgb urine dipstick: NEGATIVE
Ketones, ur: NEGATIVE
Protein, ur: NEGATIVE

## 2011-08-15 LAB — CBC
Hemoglobin: 11 — ABNORMAL LOW
MCV: 82
RBC: 4.13
WBC: 5.8

## 2011-08-15 LAB — DIFFERENTIAL
Basophils Absolute: 0
Eosinophils Absolute: 0.2
Lymphocytes Relative: 27
Lymphs Abs: 1.6
Neutro Abs: 3.5

## 2011-08-16 LAB — DIFFERENTIAL
Basophils Absolute: 0
Basophils Relative: 0
Monocytes Relative: 8
Neutro Abs: 3.5
Neutrophils Relative %: 68

## 2011-08-16 LAB — CBC
HCT: 29.1 — ABNORMAL LOW
Hemoglobin: 9.2 — ABNORMAL LOW
MCHC: 31.5
RDW: 17.1 — ABNORMAL HIGH

## 2011-08-16 LAB — COMPREHENSIVE METABOLIC PANEL
Alkaline Phosphatase: 60
BUN: 5 — ABNORMAL LOW
GFR calc non Af Amer: >60
Glucose, Bld: 93
Potassium: 3.1 — ABNORMAL LOW
Total Protein: 6.6

## 2011-08-16 LAB — URINALYSIS, ROUTINE W REFLEX MICROSCOPIC
Bilirubin Urine: NEGATIVE
Glucose, UA: NEGATIVE
Ketones, ur: NEGATIVE
pH: 7.5

## 2011-08-16 LAB — LIPASE, BLOOD: Lipase: 74 — ABNORMAL HIGH

## 2011-08-17 LAB — URINALYSIS, ROUTINE W REFLEX MICROSCOPIC
Glucose, UA: NEGATIVE
Nitrite: NEGATIVE
Protein, ur: NEGATIVE
pH: 6

## 2011-08-17 LAB — COMPREHENSIVE METABOLIC PANEL
BUN: 6
CO2: 28
Calcium: 9.3
Creatinine, Ser: 0.74
GFR calc Af Amer: >60
GFR calc non Af Amer: >60
Glucose, Bld: 94

## 2011-08-17 LAB — DIFFERENTIAL
Basophils Absolute: 0
Lymphocytes Relative: 27
Lymphs Abs: 1.4
Neutrophils Relative %: 60

## 2011-08-17 LAB — CBC
MCV: 82.1
RBC: 3.94
WBC: 5.2

## 2011-08-17 LAB — URINE MICROSCOPIC-ADD ON

## 2011-10-10 ENCOUNTER — Encounter (HOSPITAL_COMMUNITY): Payer: Medicare Other | Attending: Oncology

## 2011-10-10 DIAGNOSIS — D509 Iron deficiency anemia, unspecified: Secondary | ICD-10-CM

## 2011-10-10 LAB — DIFFERENTIAL
Basophils Absolute: 0 10*3/uL (ref 0.0–0.1)
Eosinophils Absolute: 0.2 10*3/uL (ref 0.0–0.7)
Eosinophils Relative: 4 % (ref 0–5)
Lymphs Abs: 1.6 10*3/uL (ref 0.7–4.0)
Neutrophils Relative %: 50 % (ref 43–77)

## 2011-10-10 LAB — CBC
MCH: 29.9 pg (ref 26.0–34.0)
Platelets: 299 10*3/uL (ref 150–400)
RBC: 3.81 MIL/uL — ABNORMAL LOW (ref 3.87–5.11)
RDW: 15.4 % (ref 11.5–15.5)
WBC: 4.3 10*3/uL (ref 4.0–10.5)

## 2011-10-10 LAB — BASIC METABOLIC PANEL
Calcium: 10.3 mg/dL (ref 8.4–10.5)
GFR calc Af Amer: 77 mL/min — ABNORMAL LOW (ref >90)
GFR calc non Af Amer: 67 mL/min — ABNORMAL LOW (ref >90)
Glucose, Bld: 83 mg/dL (ref 70–99)
Potassium: 3.5 mEq/L (ref 3.5–5.1)
Sodium: 142 mEq/L (ref 135–145)

## 2011-10-10 LAB — FERRITIN: Ferritin: 207 ng/mL (ref 10–291)

## 2011-10-10 NOTE — Progress Notes (Signed)
Labs drawn today for cbc/diff,bmp,ferr 

## 2011-10-12 ENCOUNTER — Ambulatory Visit (HOSPITAL_COMMUNITY): Payer: Medicare Other | Admitting: Oncology

## 2011-10-31 ENCOUNTER — Other Ambulatory Visit (HOSPITAL_COMMUNITY): Payer: Self-pay | Admitting: Internal Medicine

## 2011-10-31 DIAGNOSIS — Z139 Encounter for screening, unspecified: Secondary | ICD-10-CM

## 2011-11-14 ENCOUNTER — Ambulatory Visit (HOSPITAL_COMMUNITY)
Admission: RE | Admit: 2011-11-14 | Discharge: 2011-11-14 | Disposition: A | Discharge status: Home / Self Care | Payer: Medicare Other | Source: Ambulatory Visit | Attending: Internal Medicine | Admitting: Internal Medicine

## 2011-11-14 ENCOUNTER — Other Ambulatory Visit (HOSPITAL_COMMUNITY): Payer: Self-pay | Admitting: Family Medicine

## 2011-11-14 ENCOUNTER — Encounter (HOSPITAL_COMMUNITY): Payer: Medicare Other | Attending: Oncology | Admitting: Oncology

## 2011-11-14 VITALS — BP 127/81 | HR 80 | Temp 98.4°F | Wt 146.8 lb

## 2011-11-14 DIAGNOSIS — Z1231 Encounter for screening mammogram for malignant neoplasm of breast: Secondary | ICD-10-CM | POA: Insufficient documentation

## 2011-11-14 DIAGNOSIS — Z139 Encounter for screening, unspecified: Secondary | ICD-10-CM

## 2011-11-14 DIAGNOSIS — D509 Iron deficiency anemia, unspecified: Secondary | ICD-10-CM

## 2011-11-14 DIAGNOSIS — K649 Unspecified hemorrhoids: Secondary | ICD-10-CM

## 2011-11-14 NOTE — Patient Instructions (Signed)
[facility]  Discharge Instructions  RECOMMENDATIONS MADE BY THE CONSULTANT AND ANY TEST RESULTS WILL BE SENT TO YOUR REFERRING DOCTOR.   EXAM FINDINGS BY MD TODAY AND SIGNS AND SYMPTOMS TO REPORT TO CLINIC OR PRIMARY MD:   Continue with Iron tablets  Do Not take Aspirin  Return in 1 month and 3 months for labs  Return to see tom in 3 months after lab work  You will continue [name] via IV if needed per lab work here at [facility]     I acknowledge that I have been informed and understand all the instructions given to me and received a copy. I do not have any more questions at this time, but understand that I may call the Specialty Clinic at [facility] at [phone] during business hours should I have any further questions or need assistance in obtaining follow-up care.    __________________________________________  _____________  __________ Signature of Patient or Authorized Representative            Date                   Time    __________________________________________ Nurse's Signature

## 2011-11-14 NOTE — Progress Notes (Signed)
Melody Gleason, MD, MD [address] [place] [address]  1. ANEMIA, IRON DEFICIENCY  Multiple Vitamin (MULTIVITAMIN) capsule, CBC, Ferritin, CBC, Differential, Iron and TIBC, Ferritin    CURRENT THERAPY: [name] 1020 mg as needed per lab results.   INTERVAL HISTORY: Melody Mitchell 70 y.o. female returns for  regular  visit for followup of iron deficiency anemia.   The patient reports some pain in B/L knees.  She reports that this is likely arthritic in nature.  She was given Oxycodone for pain management by her PCP.  She explains that the medication was beneficial.  She will follow-up with her PCP regarding this complaints.   She also reports that her vertigo is improved.  This is being followed by her PCP as well.    The patient does not rectal bleeding occasionally from hemorrhoids.  If this continues, we may need to consider having the patient see GI for evaluation of this.   The patient requests if she can take her aspirin.  I have recommended that she does not take this medication due to the way it alters the function of platelets.  This may exacerbate her iron deficiency.  She will continue her PO iron pills.   Otherwise, we spent time going over patient education.  I personally reviewed and went over laboratory results with the patient.   The patient had her mammogram today.  The report is pending.    Past Medical History  Diagnosis Date  . High cholesterol   . Acid reflux   . Hx: recurrent pneumonia   . Anemia   . History of contact dermatitis     has ANEMIA, IRON DEFICIENCY; HEMORRHOIDS; ARTERIOVENOUS MALFORMATION; GERD; High cholesterol; and Acid reflux on her problem list.     is allergic to latex and sulfonamide derivatives.  Ms. Baize does not currently have medications on file.  Past Surgical History  Procedure Date  . Abdominal hysterectomy   . Appendectomy   . Cholecystectomy   . Bilateral breast biopsies  benign     Denies any headaches, dizziness,  double vision, fevers, chills, night sweats, nausea, vomiting, diarrhea, constipation, chest pain, heart palpitations, shortness of breath, blood in stool, black tarry stool, urinary pain, urinary burning, urinary frequency, hematuria.   PHYSICAL EXAMINATION  ECOG PERFORMANCE STATUS: 0 - Asymptomatic  Filed Vitals:   11/14/11 1007  BP: 127/81  Pulse: 80  Temp: 98.4 F (36.9 C)    GENERAL:alert, no distress, well nourished, well developed, comfortable, cooperative and smiling SKIN: skin color, texture, turgor are normal, no rashes or significant lesions HEAD: Normocephalic, No masses, lesions, tenderness or abnormalities EYES: normal EARS: External ears normal OROPHARYNX:mucous membranes are moist  NECK: supple, trachea midline LYMPH:  no palpable lymphadenopathy BREAST:not examined LUNGS: clear to auscultation and percussion HEART: regular rate & rhythm, no murmurs, no gallops, S1 normal and S2 normal ABDOMEN:abdomen soft, non-tender and normal bowel sounds BACK: Back symmetric, no curvature., No CVA tenderness EXTREMITIES:less then 2 second capillary refill, no joint deformities, effusion, or inflammation, no edema, no skin discoloration, no clubbing, no cyanosis  NEURO: alert & oriented x 3 with fluent speech, no focal motor/sensory deficits, gait normal   LABORATORY DATA: CBC    Component Value Date/Time   WBC 4.3 10/10/2011 0931   RBC 3.81* 10/10/2011 0931   HGB 11.4* 10/10/2011 0931   HCT 35.0* 10/10/2011 0931   PLT 299 10/10/2011 0931   MCV 91.9 10/10/2011 0931   MCH 29.9 10/10/2011 0931  MCHC 32.6 10/10/2011 0931   RDW 15.4 10/10/2011 0931   LYMPHSABS 1.6 10/10/2011 0931   MONOABS 0.4 10/10/2011 0931   EOSABS 0.2 10/10/2011 0931   BASOSABS 0.0 10/10/2011 0931    Lab Results  Component Value Date   FERRITIN 207 10/10/2011      RADIOGRAPHIC STUDIES:  Mammogram report pending   ASSESSMENT:  1. Iron deficiency anemia 2. Hemorrhoids 3. B/L knee pain,  possibly arthritic pain, followed by PCP.    PLAN:  1. Yearly screening mammogram performed today.  Report is pending. 2. Patient education regarding Ferritin 3. Lab work in 1 month: CBC, ferritin 4. Lab work in 3 months: CBC diff, Iron/TIBC, Ferritin 5. I personally reviewed and went over laboratory results with the patient. 6. Recommended the patient refrain from Aspirin usage.  Patient education regarding the role of aspirin and its effects on platelets.  Education regarding increased bleeding and worsening of Iron Deficiency Anemia.  7. Return in 3 months for follow-up.  Will begin increasing the interval between visits at that time pending lab results.  Will consider 4-5 month intervals.   All questions were answered. The patient knows to call the clinic with any problems, questions or concerns. We can certainly see the patient much sooner if necessary.   Aran Menning

## 2011-12-12 ENCOUNTER — Encounter (HOSPITAL_COMMUNITY): Payer: Medicare Other | Attending: Oncology

## 2011-12-12 DIAGNOSIS — D509 Iron deficiency anemia, unspecified: Secondary | ICD-10-CM

## 2011-12-12 LAB — CBC
MCV: 90.3 fL (ref 78.0–100.0)
Platelets: 295 10*3/uL (ref 150–400)
RBC: 4 MIL/uL (ref 3.87–5.11)
RDW: 13.6 % (ref 11.5–15.5)
WBC: 4.6 10*3/uL (ref 4.0–10.5)

## 2011-12-12 LAB — FERRITIN: Ferritin: 94 ng/mL (ref 10–291)

## 2011-12-12 NOTE — Progress Notes (Signed)
Labs drawn today for cbc,ferr 

## 2012-02-06 ENCOUNTER — Encounter (HOSPITAL_COMMUNITY): Payer: Medicare Other | Attending: Oncology

## 2012-02-06 DIAGNOSIS — D509 Iron deficiency anemia, unspecified: Secondary | ICD-10-CM | POA: Insufficient documentation

## 2012-02-06 LAB — IRON AND TIBC
Iron: 37 ug/dL — ABNORMAL LOW (ref 42–135)
Saturation Ratios: 15 % — ABNORMAL LOW (ref 20–55)
TIBC: 250 ug/dL (ref 250–470)

## 2012-02-06 LAB — CBC
Hemoglobin: 11.4 g/dL — ABNORMAL LOW (ref 12.0–15.0)
MCH: 28.7 pg (ref 26.0–34.0)
Platelets: 331 10*3/uL (ref 150–400)
RBC: 3.97 MIL/uL (ref 3.87–5.11)
WBC: 4.3 10*3/uL (ref 4.0–10.5)

## 2012-02-06 LAB — DIFFERENTIAL
Eosinophils Absolute: 0.1 10*3/uL (ref 0.0–0.7)
Lymphs Abs: 1.4 10*3/uL (ref 0.7–4.0)
Monocytes Relative: 7 % (ref 3–12)
Neutro Abs: 2.6 10*3/uL (ref 1.7–7.7)
Neutrophils Relative %: 59 % (ref 43–77)

## 2012-02-06 LAB — FERRITIN: Ferritin: 56 ng/mL (ref 10–291)

## 2012-02-06 NOTE — Progress Notes (Signed)
Labs drawn today for cbc/diff,ferr,Iron and IBC 

## 2012-02-08 ENCOUNTER — Encounter (HOSPITAL_BASED_OUTPATIENT_CLINIC_OR_DEPARTMENT_OTHER): Payer: Medicare Other | Admitting: Oncology

## 2012-02-08 ENCOUNTER — Encounter (HOSPITAL_COMMUNITY): Payer: Self-pay | Admitting: Oncology

## 2012-02-08 VITALS — BP 112/80 | HR 97 | Temp 98.6°F | Ht 63.0 in | Wt 145.5 lb

## 2012-02-08 DIAGNOSIS — D509 Iron deficiency anemia, unspecified: Secondary | ICD-10-CM

## 2012-02-08 NOTE — Progress Notes (Signed)
Forest Gleason, MD, MD [address] [place] [address]  1. ANEMIA, IRON DEFICIENCY  ferumoxytol ([name]) 1,020 mg in sodium chloride 0.9 % 100 mL IVPB, CBC, Differential, Ferritin, CBC, Differential, Ferritin, CBC, Differential, Ferritin, Basic metabolic panel    CURRENT THERAPY:[name] 1020 mg as needed per lab results.  INTERVAL HISTORY: Melody Mitchell 71 y.o. female returns for  regular  visit for followup of iron deficiency anemia.   On our last encounter, the patient complained of vertigo symptoms. She reports today that these symptoms have improved. She does note some dizziness occasionally but not as bad as it was 3 months ago.    I personally reviewed and went over laboratory results with the patient.  I personally specimen time and patient discussing her laboratory work. I provided her a copy of her laboratory work. Her hemoglobin remains stable, however in the past 2-3 months her ferritin has decreased by half.  Her ferritin on 10/10/2011 was noted to be 207, 12/12/2011 ferritin is 94, and most recently on 02/06/2012 ferritin is noted to be 56. So we spent some time discussing this and it was decided that in approximately 2 months time we will have the patient return for a [name] infusion. We will schedule this proactively. We discussed some time discussing future followup following this [name] infusion. We'll perform laboratory work one month following [name] and in 3 months following [name] infusion. She will return after this 3 month lab appointment.  I've encouraged her to continue with her iron pills. I've explained to her that this seems to be prolonging the timespan between [name] infusions and decreasing in frequency of infusions.  The patient agrees with the aforementioned up plan.  The patient denies any complaints. ROS: No TIA's or unusual headaches, no dysphagia.  No prolonged cough. No dyspnea or chest pain on exertion.  No abdominal pain, change in bowel habits,  black or bloody stools.  No urinary tract symptoms.  No new or unusual musculoskeletal symptoms.  Normal menses, no abnormal vaginal bleeding, discharge or unexpected pelvic pain. No new breast lumps, breast pain or nipple discharge.   Past Medical History  Diagnosis Date  . High cholesterol   . Acid reflux   . Hx: recurrent pneumonia   . Anemia   . History of contact dermatitis     has ANEMIA, IRON DEFICIENCY; HEMORRHOIDS; ARTERIOVENOUS MALFORMATION; GERD; High cholesterol; and Acid reflux on her problem list.     is allergic to latex and sulfonamide derivatives.  Ms. Tippins does not currently have medications on file.  Past Surgical History  Procedure Date  . Abdominal hysterectomy   . Appendectomy   . Cholecystectomy   . Bilateral breast biopsies  benign     Denies any headaches, dizziness, double vision, fevers, chills, night sweats, nausea, vomiting, diarrhea, constipation, chest pain, heart palpitations, shortness of breath, blood in stool, black tarry stool, urinary pain, urinary burning, urinary frequency, hematuria.   PHYSICAL EXAMINATION  ECOG PERFORMANCE STATUS: 0 - Asymptomatic  Filed Vitals:   02/08/12 0858  BP: 112/80  Pulse: 97  Temp: 98.6 F (37 C)    GENERAL:alert, no distress, well nourished, well developed, comfortable, cooperative and smiling SKIN: skin color, texture, turgor are normal, no rashes or significant lesions HEAD: Normocephalic, No masses, lesions, tenderness or abnormalities EYES: normal, PERRLA, EOMI, Conjunctiva are pink and non-injected EARS: External ears normal OROPHARYNX:lips, buccal mucosa, and tongue normal and mucous membranes are moist  NECK: supple, trachea midline LYMPH:  no palpable  lymphadenopathy BREAST:not examined LUNGS: clear to auscultation and percussion HEART: regular rate & rhythm, no murmurs, no gallops, S1 normal and S2 normal ABDOMEN:abdomen soft, non-tender and normal bowel sounds BACK: Back symmetric, no  curvature., No CVA tenderness EXTREMITIES:less then 2 second capillary refill, no joint deformities, effusion, or inflammation, no edema, no skin discoloration, no clubbing, no cyanosis  NEURO: alert & oriented x 3 with fluent speech, no focal motor/sensory deficits, gait normal    LABORATORY DATA:  Results for Elmendorf, Madgie B (MRN [id number]) as of 02/08/2012 12:30  Ref. Range 12/12/2011 08:40 02/06/2012 08:21  WBC Latest Range: 4.0-10.5 K/uL 4.6 4.3  RBC Latest Range: 3.87-5.11 MIL/uL 4.00 3.97  HGB Latest Range: 12.0-15.0 g/dL 40.9 (L) 81.1 (L)  HCT Latest Range: 36.0-46.0 % 36.1 35.7 (L)  MCV Latest Range: 78.0-100.0 fL 90.3 89.9  MCH Latest Range: 26.0-34.0 pg 29.3 28.7  MCHC Latest Range: 30.0-36.0 g/dL 91.4 78.2  RDW Latest Range: 11.5-15.5 % 13.6 14.0  Platelets Latest Range: 150-400 K/uL 295 331   Results for Jakob, Cande B (MRN [id number]) as of 02/08/2012 12:30  Ref. Range 10/10/2011 09:31 11/14/2011 09:00 11/14/2011 09:05 12/12/2011 08:40 02/06/2012 08:21  Ferritin Latest Range: 10-291 ng/mL 207   94 56       ASSESSMENT:  1. Iron deficiency anemia, on oral iron pills and intermittent [name] infusion. 2. 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 Gwinda Passe at [facility].  This is the cause of #1 and she will likely continue to require Iron support due to chronic blood loss. 3. B/L knee pain, possibly arthritic pain, followed by PCP.    PLAN:  1. Lab work in 8 weeks: CBC differential and ferritin 2. [name] infusion 1020 mg IV in 8 weeks. This was a signed and held order and the RN will release when the patient arrives for this infusion. 3. Lab work one month following [name] infusion and 3 months following [name] infusion: CBC, differential, and ferritin. 4.I personally reviewed and went over laboratory results with the patient. 5. Return in approximately 20 weeks for followup with laboratory work before appointment.   All questions were answered. The patient knows  to call the clinic with any problems, questions or concerns. We can certainly see the patient much sooner if necessary.  The patient and plan discussed with Glenford Peers, MD and he is in agreement with the aforementioned.   Yolette Hastings

## 2012-02-08 NOTE — Patient Instructions (Signed)
Melody Mitchell  [id number] 1941-01-15   [facility]  Discharge Instructions  RECOMMENDATIONS MADE BY THE CONSULTANT AND ANY TEST RESULTS WILL BE SENT TO YOUR REFERRING DOCTOR.   EXAM FINDINGS BY MD TODAY AND SIGNS AND SYMPTOMS TO REPORT TO CLINIC OR PRIMARY MD: Good as discussed by Samuella Bruin, PA-C.  You will be scheduled for a [name] infusion in 8 weeks and have your CBC repeated in 1 and 3 months.  You will see T. Kefalas, PA-C again after your 3 month labs.  Continue taking your iron as prescribed.    I acknowledge that I have been informed and understand all the instructions given to me and received a copy. I do not have any more questions at this time, but understand that I may call the Specialty Clinic at [facility] at [phone] during business hours should I have any further questions or need assistance in obtaining follow-up care.    __________________________________________  _____________  __________ Signature of Patient or Authorized Representative            Date                   Time    __________________________________________ Nurse's Signature

## 2012-03-08 ENCOUNTER — Encounter (HOSPITAL_COMMUNITY): Payer: Medicare Other | Attending: Oncology

## 2012-03-08 DIAGNOSIS — D509 Iron deficiency anemia, unspecified: Secondary | ICD-10-CM

## 2012-03-08 LAB — CBC
HCT: 32 % — ABNORMAL LOW (ref 36.0–46.0)
MCHC: 31.6 g/dL (ref 30.0–36.0)
MCV: 87.4 fL (ref 78.0–100.0)
Platelets: 342 10*3/uL (ref 150–400)
RDW: 14.4 % (ref 11.5–15.5)
WBC: 4.8 10*3/uL (ref 4.0–10.5)

## 2012-03-08 LAB — DIFFERENTIAL
Basophils Absolute: 0 10*3/uL (ref 0.0–0.1)
Basophils Relative: 0 % (ref 0–1)
Eosinophils Relative: 2 % (ref 0–5)
Monocytes Absolute: 0.3 10*3/uL (ref 0.1–1.0)
Neutro Abs: 2.8 10*3/uL (ref 1.7–7.7)

## 2012-03-08 LAB — FERRITIN: Ferritin: 27 ng/mL (ref 10–291)

## 2012-03-13 ENCOUNTER — Encounter (HOSPITAL_BASED_OUTPATIENT_CLINIC_OR_DEPARTMENT_OTHER): Payer: Medicare Other

## 2012-03-13 VITALS — BP 113/75 | HR 14 | Temp 99.3°F

## 2012-03-13 DIAGNOSIS — D509 Iron deficiency anemia, unspecified: Secondary | ICD-10-CM

## 2012-03-13 MED ORDER — SODIUM CHLORIDE 0.9 % IJ SOLN
10.0000 mL | INTRAMUSCULAR | Status: DC | PRN
  Administered 2012-03-13: 10 mL via INTRAVENOUS
  Filled 2012-03-13: qty 10

## 2012-03-13 MED ORDER — SODIUM CHLORIDE 0.9 % IV SOLN
1020.0000 mg | Freq: Once | INTRAVENOUS | Status: AC
  Administered 2012-03-13: 1020 mg via INTRAVENOUS
  Filled 2012-03-13: qty 34

## 2012-03-13 MED ORDER — SODIUM CHLORIDE 0.9 % IV SOLN
INTRAVENOUS | Status: DC
  Administered 2012-03-13: 11:00:00 via INTRAVENOUS

## 2012-03-16 NOTE — Progress Notes (Signed)
Labs drawn

## 2012-04-04 ENCOUNTER — Encounter (HOSPITAL_COMMUNITY): Payer: Self-pay | Admitting: Emergency Medicine

## 2012-04-04 ENCOUNTER — Ambulatory Visit (HOSPITAL_COMMUNITY): Payer: Medicare Other

## 2012-04-04 ENCOUNTER — Emergency Department (HOSPITAL_COMMUNITY): Payer: Medicare Other

## 2012-04-04 ENCOUNTER — Emergency Department (HOSPITAL_COMMUNITY)
Admission: EM | Admit: 2012-04-04 | Discharge: 2012-04-04 | Disposition: A | Discharge status: Home / Self Care | Payer: Medicare Other | Attending: Emergency Medicine | Admitting: Emergency Medicine

## 2012-04-04 DIAGNOSIS — R5383 Other fatigue: Secondary | ICD-10-CM | POA: Insufficient documentation

## 2012-04-04 DIAGNOSIS — K219 Gastro-esophageal reflux disease without esophagitis: Secondary | ICD-10-CM | POA: Insufficient documentation

## 2012-04-04 DIAGNOSIS — R5381 Other malaise: Secondary | ICD-10-CM | POA: Insufficient documentation

## 2012-04-04 DIAGNOSIS — R109 Unspecified abdominal pain: Secondary | ICD-10-CM | POA: Insufficient documentation

## 2012-04-04 LAB — URINALYSIS, ROUTINE W REFLEX MICROSCOPIC
Bilirubin Urine: NEGATIVE
Nitrite: NEGATIVE
Specific Gravity, Urine: 1.005 (ref 1.005–1.030)
Urobilinogen, UA: 0.2 mg/dL (ref 0.0–1.0)

## 2012-04-04 LAB — DIFFERENTIAL
Basophils Relative: 0 % (ref 0–1)
Eosinophils Absolute: 0.1 10*3/uL (ref 0.0–0.7)
Lymphs Abs: 1 10*3/uL (ref 0.7–4.0)
Neutrophils Relative %: 60 % (ref 43–77)

## 2012-04-04 LAB — LIPASE, BLOOD: Lipase: 61 U/L — ABNORMAL HIGH (ref 11–59)

## 2012-04-04 LAB — HEPATIC FUNCTION PANEL
Albumin: 3.6 g/dL (ref 3.5–5.2)
Total Protein: 7.8 g/dL (ref 6.0–8.3)

## 2012-04-04 LAB — BASIC METABOLIC PANEL
GFR calc Af Amer: >90 mL/min (ref >90)
GFR calc non Af Amer: 82 mL/min — ABNORMAL LOW (ref >90)
Potassium: 3.7 mEq/L (ref 3.5–5.1)
Sodium: 138 mEq/L (ref 135–145)

## 2012-04-04 LAB — CBC
MCH: 28.5 pg (ref 26.0–34.0)
Platelets: 270 10*3/uL (ref 150–400)
RBC: 4.11 MIL/uL (ref 3.87–5.11)

## 2012-04-04 LAB — URINE MICROSCOPIC-ADD ON

## 2012-04-04 MED ORDER — ONDANSETRON HCL 8 MG PO TABS: 8.0000 mg | ORAL_TABLET | ORAL | Status: AC | PRN

## 2012-04-04 MED ORDER — SODIUM CHLORIDE 0.9 % IV SOLN
INTRAVENOUS | Status: DC
  Administered 2012-04-04: 09:00:00 via INTRAVENOUS

## 2012-04-04 MED ORDER — ONDANSETRON HCL 4 MG/2ML IJ SOLN
4.0000 mg | Freq: Once | INTRAMUSCULAR | Status: AC
  Administered 2012-04-04: 4 mg via INTRAVENOUS
  Filled 2012-04-04: qty 2

## 2012-04-04 MED ORDER — MORPHINE SULFATE 2 MG/ML IJ SOLN
2.0000 mg | Freq: Once | INTRAMUSCULAR | Status: AC
  Administered 2012-04-04: 2 mg via INTRAVENOUS
  Filled 2012-04-04: qty 1

## 2012-04-04 MED ORDER — SODIUM CHLORIDE 0.9 % IV BOLUS (SEPSIS)
500.0000 mL | Freq: Once | INTRAVENOUS | Status: AC
  Administered 2012-04-04: 1000 mL via INTRAVENOUS

## 2012-04-04 MED ORDER — OXYCODONE-ACETAMINOPHEN 5-325 MG PO TABS: 1.0000 | ORAL_TABLET | Freq: Four times a day (QID) | ORAL | Status: AC | PRN

## 2012-04-04 NOTE — Discharge Instructions (Signed)
Abdominal Pain Many things can cause belly (abdominal) pain. Most times, the belly pain is not dangerous. The amount of belly pain does not tell how serious the problem may be. Many cases of belly pain can be watched and treated at home. HOME CARE   Do not take medicines that help you go poop (laxatives) unless told to by your doctor.   Only take medicine as told by your doctor.   Eat or drink as told by your doctor. Your doctor will tell you if you should be on a special diet.  GET HELP RIGHT AWAY IF:   The pain does not go away.   You have a fever.   You keep throwing up (vomiting).   The pain changes and is only in the right or left part of the belly.   You have bloody or tarry looking poop.  MAKE SURE YOU:   Understand these instructions.   Will watch your condition.   Will get help right away if you are not doing well or get worse.  Document Released: 04/18/2008 Document Revised: 10/20/2011 Document Reviewed: 11/16/2009 [facility] Patient Information 2012 [place], [place].  Tests were normal.  Medication for pain and nausea. Return here if worse in any way.

## 2012-04-04 NOTE — ED Notes (Signed)
Pt c/o lightheadedness and weakness x 2 days with upper abd pain. Some diarrhea on Monday. Denies n/v. nad noted.

## 2012-04-04 NOTE — ED Notes (Signed)
Patient does not need anything at this time. 

## 2012-04-04 NOTE — ED Notes (Signed)
Patient is waiting for the MD for results. RN Natale Lay a cup of coffee for the patient.

## 2012-04-04 NOTE — ED Provider Notes (Signed)
History   This chart was scribed for Melody Hutching, MD by Clarita Crane. The patient was seen in room APA06/APA06. Patient's care was started at 0753.    CSN: [id number]  Arrival date & time 04/04/12  1478   First MD Initiated Contact with Patient 04/04/12 [phone]      Chief Complaint  Patient presents with  . Weakness    (Consider location/radiation/quality/duration/timing/severity/associated sxs/prior treatment) HPI Melody DAFOE is a 71 y.o. female who presents to the Emergency Department complaining of constant moderate to severe generalized weakness with associated upper abdominal pain described as aching radiating to bilateral sides of back, dizziness described as lightheadedness, nausea and HA onset 2 days ago and persistent since. Denies vomiting, diarrhea, fever, swelling of lower extremities. Patient with h/o high cholesterol, anemia, recurrent pneumonia, contact dermatitis, abdominal hysterectomy, appendectomy, cholecystectomy and is a former smoker. Patient also notes that she receives and Iron transfusion multiple times per year due to anemia of an uncertain etiology. Denies h/o diabetes and HTN.  PCP- [facility], [place]  Past Medical History  Diagnosis Date  . High cholesterol   . Acid reflux   . Hx: recurrent pneumonia   . Anemia   . History of contact dermatitis     Past Surgical History  Procedure Date  . Abdominal hysterectomy   . Appendectomy   . Cholecystectomy   . Bilateral breast biopsies  benign     Family History  Problem Relation Age of Onset  . Heart failure Mother   . Diabetes Sister     History  Substance Use Topics  . Smoking status: Former Games developer  . Smokeless tobacco: Not on file  . Alcohol Use: No    OB History    Grav Para Term Preterm Abortions TAB SAB Ect Mult Living                  Review of Systems A complete 10 system review of systems was obtained and all systems are negative except as noted in the HPI  and PMH.   Allergies  Latex and Sulfonamide derivatives  Home Medications   Current Outpatient Rx  Name Route Sig Dispense Refill  . DRY EYES OP Ophthalmic Apply to eye daily.      . CYANOCOBALAMIN 1000 MCG/ML IJ SOLN Intramuscular Inject 1,000 mcg into the muscle once. Taken around the 10th of each month    . ESOMEPRAZOLE MAGNESIUM 40 MG PO CPDR Oral Take 40 mg by mouth daily before breakfast.      . FLUTICASONE FUROATE 27.5 MCG/SPRAY NA SUSP Nasal Place 2 sprays into the nose daily.      Marland Kitchen FOLIC ACID 1 MG PO TABS Oral Take 1 mg by mouth daily.      Marland Kitchen LORATADINE 10 MG PO TABS Oral Take 10 mg by mouth daily.      . MECLIZINE HCL 50 MG PO TABS Oral Take 50 mg by mouth 3 (three) times daily as needed. Takes 1 tablet every am    . MULTIVITAMINS PO CAPS Oral Take 1 capsule by mouth daily.      Marland Kitchen BIFERA PO Oral Take by mouth daily.      Marland Kitchen PRAVASTATIN SODIUM 20 MG PO TABS Oral Take 20 mg by mouth daily.      . SUCRALFATE 1 GM/10ML PO SUSP Oral Take 1 g by mouth as needed.      Marland Kitchen TRAMADOL HCL 50 MG PO TABS Oral Take 50 mg by mouth  every 6 (six) hours as needed. For pain      BP 116/88  Pulse 83  Temp(Src) 98.7 F (37.1 C) (Oral)  Resp 17  Ht 5\' 4"  (1.626 m)  Wt 139 lb (63.05 kg)  BMI 23.86 kg/m2  SpO2 100%  Physical Exam  Nursing note and vitals reviewed. Constitutional: She is oriented to person, place, and time. She appears well-developed and well-nourished. No distress.  HENT:  Head: Normocephalic and atraumatic.  Eyes: EOM are normal. Pupils are equal, round, and reactive to light.  Neck: Neck supple. No tracheal deviation present.  Cardiovascular: Normal rate and regular rhythm.  Exam reveals no gallop and no friction rub.   No murmur heard. Pulmonary/Chest: Effort normal. No respiratory distress. She has no wheezes. She has no rales.  Abdominal: Soft. She exhibits no distension. There is no tenderness.  Musculoskeletal: Normal range of motion. She exhibits no edema.    Neurological: She is alert and oriented to person, place, and time. No sensory deficit.  Skin: Skin is warm and dry.  Psychiatric: She has a normal mood and affect. Her behavior is normal.    ED Course  Procedures (including critical care time)  DIAGNOSTIC STUDIES: Oxygen Saturation is 100% on room air, normal by my interpretation.    COORDINATION OF CARE: 9:02AM-Patient informed of current plan for treatment and evaluation and agrees with plan at this time. Will obtain 3 way abdomen series, CBC, Complete Metabolic Panel, Lipase, UA.  Labs Reviewed  URINALYSIS, ROUTINE W REFLEX MICROSCOPIC - Abnormal; Notable for the following:    Leukocytes, UA SMALL (*)    All other components within normal limits  CBC - Abnormal; Notable for the following:    WBC 3.8 (*)    Hemoglobin 11.7 (*)    RDW 15.7 (*)    All other components within normal limits  BASIC METABOLIC PANEL - Abnormal; Notable for the following:    GFR calc non Af Amer 82 (*)    All other components within normal limits  HEPATIC FUNCTION PANEL - Abnormal; Notable for the following:    Total Bilirubin 0.2 (*)    All other components within normal limits  LIPASE, BLOOD - Abnormal; Notable for the following:    Lipase 61 (*)    All other components within normal limits  DIFFERENTIAL  URINE MICROSCOPIC-ADD ON   Dg Abd Acute W/chest  04/04/2012  *RADIOLOGY REPORT*  Clinical Data: Diarrhea, abdominal pain and shortness of breath.  ACUTE ABDOMEN SERIES (ABDOMEN 2 VIEW & CHEST 1 VIEW)  Comparison: Acute abdominal series 05/04/2011.  Abdominal CT 10/01/2008.  Findings: Stable mild cardiomegaly and aortic ectasia.  There is stable mild scarring at the left lung base.  The lungs are otherwise clear.  There is no pleural effusion.  The bowel gas pattern remains normal.  There is no free intraperitoneal air.  Cholecystectomy clips are noted.  Several paraspinal and pelvic calcifications are again noted, not substantially changed and  likely all phleboliths.  There is a stable mild convex right lumbar scoliosis.  IMPRESSION: No acute cardiopulmonary or abdominal process demonstrated.  Original Report Authenticated By: Gerrianne Scale, M.D.     No diagnosis found.    MDM  No acute abdomen.  Color is good. she does not look toxic. White count normal.  Patient understands to return if worse. Discharge home with pain and nausea medicine     I personally performed the services described in this documentation, which was scribed in my presence.  The recorded information has been reviewed and considered.    Melody Hutching, MD 04/04/12 1247

## 2012-04-10 ENCOUNTER — Encounter (HOSPITAL_COMMUNITY): Payer: Medicare Other | Attending: Oncology

## 2012-04-10 DIAGNOSIS — D509 Iron deficiency anemia, unspecified: Secondary | ICD-10-CM | POA: Insufficient documentation

## 2012-04-10 LAB — DIFFERENTIAL
Basophils Absolute: 0 10*3/uL (ref 0.0–0.1)
Lymphocytes Relative: 32 % (ref 12–46)
Monocytes Absolute: 0.3 10*3/uL (ref 0.1–1.0)
Monocytes Relative: 7 % (ref 3–12)
Neutro Abs: 2.4 10*3/uL (ref 1.7–7.7)

## 2012-04-10 LAB — CBC
HCT: 34.8 % — ABNORMAL LOW (ref 36.0–46.0)
Hemoglobin: 11.1 g/dL — ABNORMAL LOW (ref 12.0–15.0)
WBC: 4 10*3/uL (ref 4.0–10.5)

## 2012-05-03 ENCOUNTER — Other Ambulatory Visit (HOSPITAL_COMMUNITY): Payer: Medicare Other

## 2012-05-11 ENCOUNTER — Encounter (HOSPITAL_COMMUNITY): Payer: Medicare Other | Attending: Oncology | Admitting: Oncology

## 2012-05-11 ENCOUNTER — Encounter (HOSPITAL_COMMUNITY): Payer: Self-pay | Admitting: Oncology

## 2012-05-11 VITALS — BP 97/66 | HR 85 | Temp 98.8°F | Wt 142.8 lb

## 2012-05-11 DIAGNOSIS — D509 Iron deficiency anemia, unspecified: Secondary | ICD-10-CM | POA: Insufficient documentation

## 2012-05-11 DIAGNOSIS — M129 Arthropathy, unspecified: Secondary | ICD-10-CM

## 2012-05-11 NOTE — Patient Instructions (Addendum)
Melody Mitchell  [id number] May 29, 1941 Dr. Glenford Peers   [facility]  Discharge Instructions  RECOMMENDATIONS MADE BY THE CONSULTANT AND ANY TEST RESULTS WILL BE SENT TO YOUR REFERRING DOCTOR.   EXAM FINDINGS BY MD TODAY AND SIGNS AND SYMPTOMS TO REPORT TO CLINIC OR PRIMARY MD: exam and discussion per PA.  Report increased shortness of breath, increased fatigue, increased ice intake or craving of starchy foods.  MEDICATIONS PRESCRIBED: none      SPECIAL INSTRUCTIONS/FOLLOW-UP: Lab work Needed in 2 months and 4 months and Return to Clinic in 4 months after labs to see PA.   I acknowledge that I have been informed and understand all the instructions given to me and received a copy. I do not have any more questions at this time, but understand that I may call the [facility] at [place] at [phone] during business hours should I have any further questions or need assistance in obtaining follow-up care.    __________________________________________  _____________  __________ Signature of Patient or Authorized Representative            Date                   Time    __________________________________________ Nurse's Signature

## 2012-05-11 NOTE — Progress Notes (Signed)
Forest Gleason, MD [address] [place] [address]  1. ANEMIA, IRON DEFICIENCY  loratadine-pseudoephedrine (CLARITIN-D 24-HOUR) 10-240 MG per 24 hr tablet, Docusate Calcium (STOOL SOFTENER PO), amitriptyline (ELAVIL) 25 MG tablet, CBC, Ferritin, CBC, Differential, Ferritin    CURRENT THERAPY: [name] 1020 mg as needed per lab results.  Last infusion was on 03/13/2012.  INTERVAL HISTORY: Melody Mitchell 71 y.o. female returns for  regular  visit for followup of  iron deficiency anemia.   Melody Mitchell is doing very well.  She denies any complaints. Unfortunately, the patient was seen in the emergency department in May of 2013 for abdominal pain, nausea, vomiting, and generalized weakness. She was discharged home after receiving fluids and anti-emetics. She was discharged with a gastroenteritis of viral origin. Since that time, she has not had any of these symptoms.  I personally reviewed and went over laboratory results with the patient.  I printed out the patient's laboratory work and we spent some time discussing this. Laboratory work from her emergency room visit was only remarkable for a mildly elevated lipase of 61. Otherwise, her renal function, liver function, electrolytes were within normal limits. We spent some time discussing her blood counts today. She remains mildly anemic. Her ferritin most recently at the end of May was within normal limits at 340. She did receive a [name] IV infusion of 1020 mg on 03/13/2012. She is taking her oral iron which seems to be increasing the time spent between IV infusions. We will continue with his oral iron as she is likely absorbing some of this.  She does report that the oral iron is constipating for her and she utilizes a stool softener to keep her stools regular.  I spent some time with the patient reviewing her future treatment plan. We will perform laboratory work in 2 months time, 4 months time, and she will return in 4 months for followup. We will infuse  [name] IV her laboratory results.  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  In the emergency department on her visit in May of 2013, she was given oxycodone for her discomfort. She reports that this was much more effective than her prescribed tramadol.   I'll defer this to her primary care physician. I did inform the patient that if she does take oxycodone, this can be constipating and advised her to continue with her bowel regimen. She is not interested in surgical intervention at this time. She also has some vertigo which is being treated by her primary care physician.  Hematologically, the patient denies any complaints. She denies any pica. She reports that in the past she had a starch craving.  If the patient becomes more constipated, I encouraged her to try Metamucil or Benefiber to help increase her fiber intake. If she does get a different narcotic for her pain medicine by her primary care physician, I encouraged her to monitor her bowel movements that she does not get constipated. I advised her that narcotics can be constipating.  For Mother's Day, the patient received juicer.  She has been juicing vegetables and fruits and consuming juice.  She is doing this for health reasons and not for weight loss purposes.  Past Medical History  Diagnosis Date  . High cholesterol   . Acid reflux   . Hx: recurrent pneumonia   . Anemia   . History of contact dermatitis   . Arthritis     has ANEMIA, IRON DEFICIENCY; HEMORRHOIDS; ARTERIOVENOUS  MALFORMATION; GERD; High cholesterol; and Acid reflux on her problem list.     is allergic to latex and sulfonamide derivatives.  Ms. Thatch does not currently have medications on file.  Past Surgical History  Procedure Date  . Abdominal hysterectomy   . Appendectomy   . Cholecystectomy   . Bilateral breast biopsies  benign   . Colonoscopy w/ biopsies     Denies any headaches, double vision, fevers, chills,  night sweats, nausea, vomiting, diarrhea, chest pain, heart palpitations, shortness of breath, blood in stool, black tarry stool, urinary pain, urinary burning, urinary frequency, hematuria.   PHYSICAL EXAMINATION  ECOG PERFORMANCE STATUS: 0 - Asymptomatic  Filed Vitals:   05/11/12 0944  BP: 97/66  Pulse: 85  Temp: 98.8 F (37.1 C)    GENERAL:alert, healthy, no distress, well nourished, well developed, comfortable, cooperative and smiling SKIN: skin color, texture, turgor are normal, no rashes or significant lesions HEAD: Normocephalic, No masses, lesions, tenderness or abnormalities EYES: normal, Conjunctiva are pink and non-injected EARS: External ears normal OROPHARYNX:lips, buccal mucosa, and tongue normal and mucous membranes are moist  NECK: supple, trachea midline LYMPH:  not examined BREAST:not examined LUNGS: clear to auscultation and percussion HEART: regular rate & rhythm, no murmurs, no gallops, S1 normal and S2 normal ABDOMEN:abdomen soft, non-tender and normal bowel sounds BACK: Back symmetric, no curvature. EXTREMITIES:less then 2 second capillary refill, no joint deformities, effusion, or inflammation, no skin discoloration, no clubbing, no cyanosis  NEURO: alert & oriented x 3 with fluent speech, no focal motor/sensory deficits, gait normal   LABORATORY DATA: CBC    Component Value Date/Time   WBC 4.0 04/10/2012 0947   RBC 3.90 04/10/2012 0947   HGB 11.1* 04/10/2012 0947   HCT 34.8* 04/10/2012 0947   PLT 296 04/10/2012 0947   MCV 89.2 04/10/2012 0947   MCH 28.5 04/10/2012 0947   MCHC 31.9 04/10/2012 0947   RDW 16.2* 04/10/2012 0947   LYMPHSABS 1.3 04/10/2012 0947   MONOABS 0.3 04/10/2012 0947   EOSABS 0.1 04/10/2012 0947   BASOSABS 0.0 04/10/2012 0947      Chemistry      Component Value Date/Time   NA 138 04/04/2012 0830   K 3.7 04/04/2012 0830   CL 103 04/04/2012 0830   CO2 25 04/04/2012 0830   BUN 7 04/04/2012 0830   CREATININE 0.74 04/04/2012 0830        Component Value Date/Time   CALCIUM 9.7 04/04/2012 0830   ALKPHOS 91 04/04/2012 0830   AST 21 04/04/2012 0830   ALT 12 04/04/2012 0830   BILITOT 0.2* 04/04/2012 0830     Lab Results  Component Value Date   FERRITIN 340* 04/10/2012     ASSESSMENT:  1. Iron deficiency anemia, on oral iron pills and intermittent [name] infusion.  2. 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 Gwinda Passe at [facility]. This is the cause of #1 and she will likely continue to require Iron support due to chronic blood loss.  3. B/L knee pain, possibly arthritic pain, followed by PCP.  4. Vertigo, followed by PCP.    PLAN:  1. I personally reviewed and went over laboratory results with the patient. 2. Defer pain management of arthritis and vertigo to PCP. 3. Lab work in 2 months: CBC ferritin 4. Lab work in 4 months: CBC diff, BMET, Ferritin 5. Return in 4 months for follow-up.   All questions were answered. The patient knows to call the clinic with any problems, questions  or concerns. We can certainly see the patient much sooner if necessary.  The patient and plan discussed with Glenford Peers, MD and he is in agreement with the aforementioned.  Brighid Koch

## 2012-05-30 ENCOUNTER — Encounter (HOSPITAL_COMMUNITY): Payer: Self-pay | Admitting: Emergency Medicine

## 2012-05-30 ENCOUNTER — Emergency Department (HOSPITAL_COMMUNITY)
Admission: EM | Admit: 2012-05-30 | Discharge: 2012-05-30 | Disposition: A | Discharge status: Home / Self Care | Payer: Medicare Other | Attending: Emergency Medicine | Admitting: Emergency Medicine

## 2012-05-30 ENCOUNTER — Emergency Department (HOSPITAL_COMMUNITY): Payer: Medicare Other

## 2012-05-30 DIAGNOSIS — R531 Weakness: Secondary | ICD-10-CM

## 2012-05-30 DIAGNOSIS — R42 Dizziness and giddiness: Secondary | ICD-10-CM | POA: Insufficient documentation

## 2012-05-30 DIAGNOSIS — Z79899 Other long term (current) drug therapy: Secondary | ICD-10-CM | POA: Insufficient documentation

## 2012-05-30 DIAGNOSIS — R5381 Other malaise: Secondary | ICD-10-CM | POA: Insufficient documentation

## 2012-05-30 DIAGNOSIS — R110 Nausea: Secondary | ICD-10-CM | POA: Insufficient documentation

## 2012-05-30 DIAGNOSIS — E7800 Pure hypercholesterolemia, unspecified: Secondary | ICD-10-CM | POA: Insufficient documentation

## 2012-05-30 DIAGNOSIS — R5383 Other fatigue: Secondary | ICD-10-CM | POA: Insufficient documentation

## 2012-05-30 DIAGNOSIS — R51 Headache: Secondary | ICD-10-CM | POA: Insufficient documentation

## 2012-05-30 LAB — CBC WITH DIFFERENTIAL/PLATELET
Eosinophils Absolute: 0.1 10*3/uL (ref 0.0–0.7)
Hemoglobin: 11.6 g/dL — ABNORMAL LOW (ref 12.0–15.0)
Lymphocytes Relative: 34 % (ref 12–46)
Lymphs Abs: 1.4 10*3/uL (ref 0.7–4.0)
MCH: 28.9 pg (ref 26.0–34.0)
MCV: 90.3 fL (ref 78.0–100.0)
Monocytes Relative: 6 % (ref 3–12)
Neutrophils Relative %: 57 % (ref 43–77)
RBC: 4.01 MIL/uL (ref 3.87–5.11)

## 2012-05-30 LAB — URINALYSIS, ROUTINE W REFLEX MICROSCOPIC
Bilirubin Urine: NEGATIVE
Ketones, ur: NEGATIVE mg/dL
Nitrite: NEGATIVE
Protein, ur: NEGATIVE mg/dL
Urobilinogen, UA: 0.2 mg/dL (ref 0.0–1.0)
pH: 8 (ref 5.0–8.0)

## 2012-05-30 LAB — COMPREHENSIVE METABOLIC PANEL
Alkaline Phosphatase: 93 U/L (ref 39–117)
BUN: 8 mg/dL (ref 6–23)
CO2: 29 mEq/L (ref 19–32)
GFR calc Af Amer: 73 mL/min — ABNORMAL LOW (ref >90)
GFR calc non Af Amer: 63 mL/min — ABNORMAL LOW (ref >90)
Glucose, Bld: 58 mg/dL — ABNORMAL LOW (ref 70–99)
Potassium: 3.3 mEq/L — ABNORMAL LOW (ref 3.5–5.1)
Total Bilirubin: 0.2 mg/dL — ABNORMAL LOW (ref 0.3–1.2)
Total Protein: 7.6 g/dL (ref 6.0–8.3)

## 2012-05-30 LAB — URINE MICROSCOPIC-ADD ON

## 2012-05-30 MED ORDER — SODIUM CHLORIDE 0.9 % IV SOLN
Freq: Once | INTRAVENOUS | Status: AC
  Administered 2012-05-30: 10:00:00 via INTRAVENOUS

## 2012-05-30 MED ORDER — ACETAMINOPHEN 325 MG PO TABS
650.0000 mg | ORAL_TABLET | Freq: Once | ORAL | Status: AC
  Administered 2012-05-30: 650 mg via ORAL
  Filled 2012-05-30: qty 2

## 2012-05-30 MED ORDER — POTASSIUM CHLORIDE CRYS ER 20 MEQ PO TBCR
10.0000 meq | EXTENDED_RELEASE_TABLET | Freq: Once | ORAL | Status: AC
  Administered 2012-05-30: 10 meq via ORAL
  Filled 2012-05-30: qty 1

## 2012-05-30 NOTE — ED Provider Notes (Addendum)
History    This chart was scribed for Benny Lennert, MD, MD by Smitty Pluck. The patient was seen in room APA04 and the patient's care was started at 9:04AM.   CSN: [id number]  Arrival date & time 05/30/12  0834   First MD Initiated Contact with Patient 05/30/12 [phone]      Chief Complaint  Patient presents with  . Dizziness  . Nausea    (Consider location/radiation/quality/duration/timing/severity/associated sxs/prior treatment) Patient is a 71 y.o. female presenting with weakness. The history is provided by the patient. No language interpreter was used.  Weakness The primary symptoms include headaches. The symptoms began 2 days ago. The symptoms are improving. The neurological symptoms are diffuse. Context: nothing.  The headache is associated with weakness.  Additional symptoms include weakness.   Melody Mitchell is a 71 y.o. female who presents to the Emergency Department complaining of moderate dizziness and lightheadedness onset this today. Pt reports that she felt pain in her left side of neck before feeling dizzy. She reports being in kitchen and feeling the room spinning and feeling like she would have syncope. She reports having nausea. Denies fever, chills, vomiting, diarrhea. Pt has hx of vertigo.   Past Medical History  Diagnosis Date  . High cholesterol   . Acid reflux   . Hx: recurrent pneumonia   . Anemia   . History of contact dermatitis   . Arthritis     Past Surgical History  Procedure Date  . Abdominal hysterectomy   . Appendectomy   . Cholecystectomy   . Bilateral breast biopsies  benign   . Colonoscopy w/ biopsies     Family History  Problem Relation Age of Onset  . Heart failure Mother   . Diabetes Sister     History  Substance Use Topics  . Smoking status: Former Games developer  . Smokeless tobacco: Never Used  . Alcohol Use: No    OB History    Grav Para Term Preterm Abortions TAB SAB Ect Mult Living                  Review of Systems    Neurological: Positive for weakness and headaches.  All other systems reviewed and are negative.   10 Systems reviewed and all are negative for acute change except as noted in the HPI.   Allergies  Sulfonamide derivatives and Latex  Home Medications   Current Outpatient Rx  Name Route Sig Dispense Refill  . ACETAMINOPHEN 500 MG PO TABS Oral Take 500 mg by mouth every 6 (six) hours as needed. For pain    . AMITRIPTYLINE HCL 25 MG PO TABS Oral Take 25 mg by mouth at bedtime as needed.    . DRY EYES OP Both Eyes Place 1 drop into both eyes daily.     . CYANOCOBALAMIN 1000 MCG/ML IJ SOLN Intramuscular Inject 1,000 mcg into the muscle once. Taken around the 10th of each month    . STOOL SOFTENER PO Oral Take 1 tablet by mouth as needed. For constipation    . ESOMEPRAZOLE MAGNESIUM 40 MG PO CPDR Oral Take 40 mg by mouth daily before breakfast.      . FLUTICASONE FUROATE 27.5 MCG/SPRAY NA SUSP Nasal Place 2 sprays into the nose daily.      Marland Kitchen FOLIC ACID 1 MG PO TABS Oral Take 1 mg by mouth daily.      Marland Kitchen LORATADINE 10 MG PO TABS Oral Take 10 mg by mouth daily. If takes  Claritin D does not take this    . LORATADINE-PSEUDOEPHEDRINE ER 10-240 MG PO TB24 Oral Take 1 tablet by mouth as needed. Does not take claritin if uses this    . MECLIZINE HCL 50 MG PO TABS Oral Take 50 mg by mouth 3 (three) times daily as needed. For vertigo    . MULTIVITAMINS PO CAPS Oral Take 1 capsule by mouth daily.      Marland Kitchen BIFERA PO Oral Take by mouth daily.      Marland Kitchen PRAVASTATIN SODIUM 20 MG PO TABS Oral Take 20 mg by mouth daily.      . SUCRALFATE 1 GM/10ML PO SUSP Oral Take 1 g by mouth as needed. For increased stomach acid    . TRAMADOL HCL 50 MG PO TABS Oral Take 50 mg by mouth every 6 (six) hours as needed. For pain      BP 129/86  Temp 99.2 F (37.3 C)  Resp 20  Ht 5\' 4"  (1.626 m)  Wt 140 lb (63.504 kg)  BMI 24.03 kg/m2  SpO2 97%  Physical Exam  Nursing note and vitals reviewed. Constitutional: She is  oriented to person, place, and time. She appears well-developed.  HENT:  Head: Normocephalic and atraumatic.  Eyes: Conjunctivae and EOM are normal. No scleral icterus.  Neck: Neck supple. No thyromegaly present.  Cardiovascular: Normal rate and regular rhythm.  Exam reveals no gallop and no friction rub.   No murmur heard. Pulmonary/Chest: No stridor. She has no wheezes. She has no rales. She exhibits no tenderness.  Abdominal: She exhibits no distension. There is no tenderness. There is no rebound.  Musculoskeletal: Normal range of motion. She exhibits no edema.  Lymphadenopathy:    She has no cervical adenopathy.  Neurological: She is oriented to person, place, and time. Coordination normal.  Skin: No rash noted. No erythema.  Psychiatric: She has a normal mood and affect. Her behavior is normal.    ED Course  Procedures (including critical care time) DIAGNOSTIC STUDIES: Oxygen Saturation is 97% on room air, normal by my interpretation.    COORDINATION OF CARE: 9:07AM EDP discusses pt ED treatment with pt  9:15AM EDP ordered medication: 0.9% NaCl infusion  12:34PM EDP rechecks pt. Pt is ambulatory without dizziness. Symptoms have improved.   Labs Reviewed  CBC WITH DIFFERENTIAL - Abnormal; Notable for the following:    Hemoglobin 11.6 (*)     RDW 16.0 (*)     All other components within normal limits  COMPREHENSIVE METABOLIC PANEL - Abnormal; Notable for the following:    Potassium 3.3 (*)     Glucose, Bld 58 (*)     Total Bilirubin 0.2 (*)     GFR calc non Af Amer 63 (*)     GFR calc Af Amer 73 (*)     All other components within normal limits  URINALYSIS, ROUTINE W REFLEX MICROSCOPIC - Abnormal; Notable for the following:    Leukocytes, UA TRACE (*)     All other components within normal limits  URINE MICROSCOPIC-ADD ON - Abnormal; Notable for the following:    Squamous Epithelial / LPF FEW (*)     All other components within normal limits  TROPONIN I   Ct Head Wo  Contrast  05/30/2012  *RADIOLOGY REPORT*  Clinical Data: 71 year old female with dizziness, nausea, lightheaded.  CT HEAD WITHOUT CONTRAST  Technique:  Contiguous axial images were obtained from the base of the skull through the vertex without contrast.  Comparison: 05/22/2011 and  earlier.  Findings: Visualized paranasal sinuses and mastoids are clear.  No acute osseous abnormality identified.  Visualized orbits and scalp soft tissues are within normal limits.  Stable and normal for age cerebral volume.  No ventriculomegaly. No midline shift, mass effect, or evidence of mass lesion.  No acute intracranial hemorrhage identified.  Normal for age gray-white matter differentiation. No evidence of cortically based acute infarction identified.  No suspicious intracranial vascular hyperdensity.  IMPRESSION: Stable and normal for age noncontrast CT appearance of the brain.  Original Report Authenticated By: Harley Hallmark, M.D.     No diagnosis found.  Pt walked without problems or dizziness at discharge  Date: 05/30/2012  Rate: 81  Rhythm: normal sinus rhythm  QRS Axis: normal  Intervals: normal  ST/T Wave abnormalities: nonspecific ST changes  Conduction Disutrbances:none  Narrative Interpretation:   Old EKG Reviewed: unchanged   MDM     The chart was scribed for me under my direct supervision.  I personally performed the history, physical, and medical decision making and all procedures in the evaluation of this patient.Benny Lennert, MD 05/30/12 1245  Benny Lennert, MD 05/30/12 1246

## 2012-05-30 NOTE — ED Notes (Signed)
Pt states woke up this am with dizziness and lightheadedness and nausea that gets better with laying down. History of vertigo.

## 2012-05-30 NOTE — ED Notes (Signed)
Pt coming from home this am states she woke this am light headed and dizzy. Pt states she took her po meds this am with no difficulty. pts grips equil bilat. Pt transfers with little assistance. Pt states she felt a "twinge" to left lower head.

## 2012-06-29 ENCOUNTER — Other Ambulatory Visit (HOSPITAL_COMMUNITY): Payer: Self-pay | Admitting: Family Medicine

## 2012-06-29 DIAGNOSIS — R110 Nausea: Secondary | ICD-10-CM

## 2012-06-29 DIAGNOSIS — R634 Abnormal weight loss: Secondary | ICD-10-CM

## 2012-06-29 DIAGNOSIS — R109 Unspecified abdominal pain: Secondary | ICD-10-CM

## 2012-07-03 ENCOUNTER — Ambulatory Visit (HOSPITAL_COMMUNITY)
Admission: RE | Admit: 2012-07-03 | Discharge: 2012-07-03 | Disposition: A | Discharge status: Home / Self Care | Payer: Medicare Other | Source: Ambulatory Visit | Attending: Family Medicine | Admitting: Family Medicine

## 2012-07-03 ENCOUNTER — Encounter (HOSPITAL_COMMUNITY): Payer: Medicare Other | Attending: Oncology

## 2012-07-03 DIAGNOSIS — R634 Abnormal weight loss: Secondary | ICD-10-CM | POA: Insufficient documentation

## 2012-07-03 DIAGNOSIS — R109 Unspecified abdominal pain: Secondary | ICD-10-CM | POA: Insufficient documentation

## 2012-07-03 DIAGNOSIS — D509 Iron deficiency anemia, unspecified: Secondary | ICD-10-CM | POA: Insufficient documentation

## 2012-07-03 DIAGNOSIS — R110 Nausea: Secondary | ICD-10-CM | POA: Insufficient documentation

## 2012-07-03 LAB — CBC
HCT: 35.5 % — ABNORMAL LOW (ref 36.0–46.0)
Hemoglobin: 11.4 g/dL — ABNORMAL LOW (ref 12.0–15.0)
RBC: 3.93 MIL/uL (ref 3.87–5.11)
WBC: 5.1 10*3/uL (ref 4.0–10.5)

## 2012-07-03 NOTE — Progress Notes (Signed)
Labs drawn today for cbc,ferr 

## 2012-07-11 ENCOUNTER — Other Ambulatory Visit (HOSPITAL_COMMUNITY): Payer: Medicare Other

## 2012-09-10 ENCOUNTER — Encounter (HOSPITAL_COMMUNITY): Payer: Medicare Other | Attending: Oncology

## 2012-09-10 DIAGNOSIS — D509 Iron deficiency anemia, unspecified: Secondary | ICD-10-CM | POA: Insufficient documentation

## 2012-09-10 DIAGNOSIS — Q279 Congenital malformation of peripheral vascular system, unspecified: Secondary | ICD-10-CM | POA: Insufficient documentation

## 2012-09-10 LAB — CBC
HCT: 36.4 % (ref 36.0–46.0)
Hemoglobin: 11.6 g/dL — ABNORMAL LOW (ref 12.0–15.0)
MCV: 91.2 fL (ref 78.0–100.0)
RBC: 3.99 MIL/uL (ref 3.87–5.11)
WBC: 4.4 10*3/uL (ref 4.0–10.5)

## 2012-09-10 LAB — DIFFERENTIAL
Eosinophils Relative: 2 % (ref 0–5)
Lymphocytes Relative: 33 % (ref 12–46)
Lymphs Abs: 1.4 10*3/uL (ref 0.7–4.0)
Monocytes Absolute: 0.4 10*3/uL (ref 0.1–1.0)

## 2012-09-10 NOTE — Progress Notes (Signed)
Labs drawn today for cbc/diff,ferr 

## 2012-09-12 ENCOUNTER — Encounter (HOSPITAL_COMMUNITY): Payer: Self-pay | Admitting: Oncology

## 2012-09-12 ENCOUNTER — Encounter (HOSPITAL_BASED_OUTPATIENT_CLINIC_OR_DEPARTMENT_OTHER): Payer: Medicare Other | Admitting: Oncology

## 2012-09-12 VITALS — BP 115/79 | HR 81 | Temp 98.4°F | Resp 20 | Wt 143.0 lb

## 2012-09-12 DIAGNOSIS — D509 Iron deficiency anemia, unspecified: Secondary | ICD-10-CM

## 2012-09-12 DIAGNOSIS — Q279 Congenital malformation of peripheral vascular system, unspecified: Secondary | ICD-10-CM

## 2012-09-12 NOTE — Patient Instructions (Addendum)
[facility]  Discharge Instructions  RECOMMENDATIONS MADE BY THE CONSULTANT AND ANY TEST RESULTS WILL BE SENT TO YOUR REFERRING DOCTOR.   EXAM FINDINGS BY MD TODAY AND SIGNS AND SYMPTOMS TO REPORT TO CLINIC OR PRIMARY MD: Exam per T. Kefalas   INSTRUCTIONS GIVEN AND DISCUSSED: We will do labs in 2 months and 4 months  SPECIAL INSTRUCTIONS/FOLLOW-UP: Return to see [place] in 4 months   I acknowledge that I have been informed and understand all the instructions given to me and received a copy. I do not have any more questions at this time, but understand that I may call the Specialty Clinic at [facility] at [phone] during business hours should I have any further questions or need assistance in obtaining follow-up care.    __________________________________________  _____________  __________ Signature of Patient or Authorized Representative            Date                   Time    __________________________________________ Nurse's Signature

## 2012-09-12 NOTE — Progress Notes (Signed)
Melody Quale, NP [facility] [address] [place] [address]  1. ANEMIA, IRON DEFICIENCY   2. ARTERIOVENOUS MALFORMATION     CURRENT THERAPY: [name] 1020 mg as needed per lab results.  Last infusion was on 03/13/2012.  INTERVAL HISTORY: Melody Mitchell 71 y.o. female returns for  regular  visit for followup of  iron deficiency anemia.   I personally reviewed and went over laboratory results with the patient.  I printed out the patient's laboratory work and we spent some time discussing these.  Since August 2013 when her ferritin was 123, she has dropped to 67 as of 09/10/2012.  I suspect that she will require another [name] transfusion in 2-3 months.    Hematologically, the patient denies any complaints. She denies any pica. She reports that in the past she had a starch craving.  Unfortunately, the patient's best friend passed away recently from either overdose of pain medication or a myocardial infarction. She was only 73 years old. As a result, Melody Mitchell is depressed. She has seen her primary care physician who has started on antidepressant.  The patient has had her flu shot for 2013.  Past Medical History  Diagnosis Date  . High cholesterol   . Acid reflux   . Hx: recurrent pneumonia   . Anemia   . History of contact dermatitis   . Arthritis     has ANEMIA, IRON DEFICIENCY; HEMORRHOIDS; ARTERIOVENOUS MALFORMATION; GERD; High cholesterol; and Acid reflux on her problem list.     is allergic to sulfonamide derivatives and latex.  Melody Mitchell had no medications administered during this visit.  Past Surgical History  Procedure Date  . Abdominal hysterectomy   . Appendectomy   . Cholecystectomy   . Bilateral breast biopsies  benign   . Colonoscopy w/ biopsies     Denies any headaches, double vision, fevers, chills, night sweats, nausea, vomiting, diarrhea, chest pain, heart palpitations, shortness of breath, blood in stool, black tarry stool, urinary pain,  urinary burning, urinary frequency, hematuria.   PHYSICAL EXAMINATION  ECOG PERFORMANCE STATUS: 0 - Asymptomatic  Filed Vitals:   09/12/12 1016  BP: 115/79  Pulse: 81  Temp: 98.4 F (36.9 C)  Resp: 20    GENERAL:alert, healthy, no distress, well nourished, well developed, comfortable, cooperative and smiling SKIN: skin color, texture, turgor are normal, no rashes or significant lesions HEAD: Normocephalic, No masses, lesions, tenderness or abnormalities EYES: normal, Conjunctiva are pink and non-injected EARS: External ears normal OROPHARYNX:lips, buccal mucosa, and tongue normal and mucous membranes are moist  NECK: supple, trachea midline LYMPH:  not examined BREAST:not examined LUNGS: clear to auscultation and percussion HEART: regular rate & rhythm, no murmurs, no gallops, S1 normal and S2 normal ABDOMEN:abdomen soft, non-tender and normal bowel sounds BACK: Back symmetric, no curvature. EXTREMITIES:less then 2 second capillary refill, no joint deformities, effusion, or inflammation, no skin discoloration, no clubbing, no cyanosis  NEURO: alert & oriented x 3 with fluent speech, no focal motor/sensory deficits, gait normal   LABORATORY DATA: CBC    Component Value Date/Time   WBC 4.4 09/10/2012 0910   RBC 3.99 09/10/2012 0910   HGB 11.6* 09/10/2012 0910   HCT 36.4 09/10/2012 0910   PLT 304 09/10/2012 0910   MCV 91.2 09/10/2012 0910   MCH 29.1 09/10/2012 0910   MCHC 31.9 09/10/2012 0910   RDW 14.4 09/10/2012 0910   LYMPHSABS 1.4 09/10/2012 0910   MONOABS 0.4 09/10/2012 0910   EOSABS 0.1 09/10/2012  0910   BASOSABS 0.0 09/10/2012 0910      Chemistry      Component Value Date/Time   NA 144 05/30/2012 0909   K 3.3* 05/30/2012 0909   CL 105 05/30/2012 0909   CO2 29 05/30/2012 0909   BUN 8 05/30/2012 0909   CREATININE 0.89 05/30/2012 0909      Component Value Date/Time   CALCIUM 9.8 05/30/2012 0909   ALKPHOS 93 05/30/2012 0909   AST 30 05/30/2012 0909   ALT 20  05/30/2012 0909   BILITOT 0.2* 05/30/2012 0909     Lab Results  Component Value Date   FERRITIN 67 09/10/2012     ASSESSMENT:  1. Iron deficiency anemia, on oral iron pills and intermittent [name] infusion.  2. 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 Gwinda Passe at [facility]. This is the cause of #1 and she will likely continue to require Iron support due to chronic blood loss.  3. B/L knee pain, possibly arthritic pain, followed by PCP.  4. Vertigo, followed by PCP.    PLAN:  1. I personally reviewed and went over laboratory results with the patient. 2. Defer pain management of arthritis and vertigo to PCP. 3. Lab work in 2 months: CBC ferritin 4. Lab work in 4 months: CBC diff, BMET, Ferritin 5. Return in 4 months for follow-up.   All questions were answered. The patient knows to call the clinic with any problems, questions or concerns. We can certainly see the patient much sooner if necessary.  The patient and plan discussed with Glenford Peers, MD and he is in agreement with the aforementioned.  [name]

## 2012-10-29 ENCOUNTER — Encounter (HOSPITAL_COMMUNITY): Payer: Medicare Other | Attending: Oncology

## 2012-10-29 DIAGNOSIS — Q279 Congenital malformation of peripheral vascular system, unspecified: Secondary | ICD-10-CM | POA: Insufficient documentation

## 2012-10-29 DIAGNOSIS — D509 Iron deficiency anemia, unspecified: Secondary | ICD-10-CM

## 2012-10-29 LAB — CBC WITH DIFFERENTIAL/PLATELET
Basophils Relative: 0 % (ref 0–1)
Eosinophils Absolute: 0.1 10*3/uL (ref 0.0–0.7)
HCT: 34 % — ABNORMAL LOW (ref 36.0–46.0)
Hemoglobin: 10.7 g/dL — ABNORMAL LOW (ref 12.0–15.0)
Lymphs Abs: 1.6 10*3/uL (ref 0.7–4.0)
MCH: 28.1 pg (ref 26.0–34.0)
MCHC: 31.5 g/dL (ref 30.0–36.0)
Monocytes Absolute: 0.3 10*3/uL (ref 0.1–1.0)
Monocytes Relative: 7 % (ref 3–12)
Neutro Abs: 2.5 10*3/uL (ref 1.7–7.7)
RBC: 3.81 MIL/uL — ABNORMAL LOW (ref 3.87–5.11)

## 2012-10-29 NOTE — Progress Notes (Signed)
Labs drawn today for cbc/diff,ferr 

## 2012-10-30 ENCOUNTER — Other Ambulatory Visit (HOSPITAL_COMMUNITY): Payer: Self-pay | Admitting: Oncology

## 2012-10-30 DIAGNOSIS — D509 Iron deficiency anemia, unspecified: Secondary | ICD-10-CM

## 2012-10-31 ENCOUNTER — Encounter (HOSPITAL_BASED_OUTPATIENT_CLINIC_OR_DEPARTMENT_OTHER): Payer: Medicare Other

## 2012-10-31 VITALS — BP 118/81 | HR 90 | Temp 98.8°F | Resp 16

## 2012-10-31 DIAGNOSIS — D509 Iron deficiency anemia, unspecified: Secondary | ICD-10-CM

## 2012-10-31 MED ORDER — SODIUM CHLORIDE 0.9 % IV SOLN
1020.0000 mg | Freq: Once | INTRAVENOUS | Status: AC
  Administered 2012-10-31: 1020 mg via INTRAVENOUS
  Filled 2012-10-31: qty 34

## 2012-10-31 MED ORDER — SODIUM CHLORIDE 0.9 % IV SOLN
INTRAVENOUS | Status: DC
  Administered 2012-10-31: 09:00:00 via INTRAVENOUS

## 2012-10-31 MED ORDER — SODIUM CHLORIDE 0.9 % IJ SOLN
10.0000 mL | INTRAMUSCULAR | Status: DC | PRN
  Administered 2012-10-31: 10 mL via INTRAVENOUS
  Filled 2012-10-31: qty 10

## 2012-10-31 NOTE — Progress Notes (Signed)
Tolerated infusion well. 

## 2012-11-14 DIAGNOSIS — J40 Bronchitis, not specified as acute or chronic: Secondary | ICD-10-CM

## 2012-11-14 HISTORY — DX: Bronchitis, not specified as acute or chronic: J40

## 2012-12-19 ENCOUNTER — Other Ambulatory Visit (HOSPITAL_COMMUNITY): Payer: Self-pay | Admitting: Family Medicine

## 2012-12-19 DIAGNOSIS — Z139 Encounter for screening, unspecified: Secondary | ICD-10-CM

## 2012-12-28 ENCOUNTER — Ambulatory Visit (HOSPITAL_COMMUNITY): Payer: Medicare Other

## 2012-12-31 ENCOUNTER — Other Ambulatory Visit (HOSPITAL_COMMUNITY): Payer: Medicare Other

## 2013-01-02 ENCOUNTER — Ambulatory Visit (HOSPITAL_COMMUNITY): Payer: Medicare Other | Admitting: Oncology

## 2013-01-03 ENCOUNTER — Encounter (HOSPITAL_COMMUNITY): Payer: Medicare Other | Attending: Oncology

## 2013-01-03 ENCOUNTER — Encounter (HOSPITAL_BASED_OUTPATIENT_CLINIC_OR_DEPARTMENT_OTHER): Payer: Medicare Other | Admitting: Oncology

## 2013-01-03 ENCOUNTER — Other Ambulatory Visit (HOSPITAL_COMMUNITY): Payer: Self-pay | Admitting: Oncology

## 2013-01-03 ENCOUNTER — Ambulatory Visit (HOSPITAL_COMMUNITY)
Admission: RE | Admit: 2013-01-03 | Discharge: 2013-01-03 | Disposition: A | Discharge status: Home / Self Care | Payer: Medicare Other | Source: Ambulatory Visit | Attending: Family Medicine | Admitting: Family Medicine

## 2013-01-03 ENCOUNTER — Encounter (HOSPITAL_COMMUNITY): Payer: Self-pay | Admitting: Oncology

## 2013-01-03 VITALS — BP 126/80 | HR 87 | Temp 98.4°F | Resp 18 | Wt 147.5 lb

## 2013-01-03 DIAGNOSIS — D509 Iron deficiency anemia, unspecified: Secondary | ICD-10-CM

## 2013-01-03 DIAGNOSIS — Q279 Congenital malformation of peripheral vascular system, unspecified: Secondary | ICD-10-CM

## 2013-01-03 DIAGNOSIS — Z139 Encounter for screening, unspecified: Secondary | ICD-10-CM

## 2013-01-03 DIAGNOSIS — Z1231 Encounter for screening mammogram for malignant neoplasm of breast: Secondary | ICD-10-CM | POA: Insufficient documentation

## 2013-01-03 DIAGNOSIS — R42 Dizziness and giddiness: Secondary | ICD-10-CM

## 2013-01-03 DIAGNOSIS — M25569 Pain in unspecified knee: Secondary | ICD-10-CM

## 2013-01-03 DIAGNOSIS — E876 Hypokalemia: Secondary | ICD-10-CM

## 2013-01-03 DIAGNOSIS — K31811 Angiodysplasia of stomach and duodenum with bleeding: Secondary | ICD-10-CM

## 2013-01-03 LAB — CBC WITH DIFFERENTIAL/PLATELET
Eosinophils Absolute: 0.1 10*3/uL (ref 0.0–0.7)
Eosinophils Relative: 3 % (ref 0–5)
HCT: 33.7 % — ABNORMAL LOW (ref 36.0–46.0)
Lymphs Abs: 1.3 10*3/uL (ref 0.7–4.0)
MCH: 29.3 pg (ref 26.0–34.0)
MCV: 90.6 fL (ref 78.0–100.0)
Monocytes Absolute: 0.3 10*3/uL (ref 0.1–1.0)
Monocytes Relative: 8 % (ref 3–12)
Platelets: 303 10*3/uL (ref 150–400)
RBC: 3.72 MIL/uL — ABNORMAL LOW (ref 3.87–5.11)

## 2013-01-03 LAB — BASIC METABOLIC PANEL
BUN: 10 mg/dL (ref 6–23)
CO2: 30 mEq/L (ref 19–32)
Calcium: 9.5 mg/dL (ref 8.4–10.5)
Creatinine, Ser: 1.02 mg/dL (ref 0.50–1.10)
GFR calc non Af Amer: 53 mL/min — ABNORMAL LOW (ref >90)
Glucose, Bld: 89 mg/dL (ref 70–99)
Sodium: 141 mEq/L (ref 135–145)

## 2013-01-03 MED ORDER — POTASSIUM CHLORIDE ER 10 MEQ PO TBCR: 10.0000 meq | EXTENDED_RELEASE_TABLET | Freq: Every day | ORAL | Status: DC

## 2013-01-03 NOTE — Addendum Note (Signed)
Addended by: Ellouise Newer on: 01/03/2013 12:07 PM   Modules accepted: Level of Service

## 2013-01-03 NOTE — Progress Notes (Signed)
Labs drawn today for cbc/diff,bmp,ferr

## 2013-01-03 NOTE — Progress Notes (Signed)
[name],[name], PA [address] [place] [address]  ANEMIA, IRON DEFICIENCY - Plan: CBC with Differential, Ferritin, CBC with Differential, Basic metabolic panel, Ferritin  ARTERIOVENOUS MALFORMATION   CURRENT THERAPY:[name] 1020 mg as needed per lab results. Last infusion was on 10/31/2012.   INTERVAL HISTORY: Melody Mitchell 72 y.o. female returns for  regular  visit for followup of iron deficiency anemia.    Jalissa is doing well.  She complains of right knee pain that causes her to limp at times. She reports that it causes her pain.  She explains that she thinks it is arthritis in nature. I have asked her to keep track of the pain and if it continues, she should follow-up with her PCP for evaluation of her right knee.   She also notes a rash on B/L hands that is pruritic intermittently.  She reports that it has been present for 1 month.  She cannot say whether it is better or worse.  She reports that it occurred before in the past and she was seen by her dermatologist who prescribed her Prednisone.  I will refer her back to her dermatologist for this rash.   I personally reviewed and went over laboratory results with the patient.  She otherwise is doing well hematologically.  Her labs are pending from today.   She had her mammogram performed this AM and the report is pending.  Hematologically, she denies any complaints and ROS questioning is negative.  She denies any blood loss in her stool, black tarry stool, and hematuria.   Past Medical History  Diagnosis Date  . High cholesterol   . Acid reflux   . Hx: recurrent pneumonia   . Anemia   . History of contact dermatitis   . Arthritis   . Bronchitis january 2014    has ANEMIA, IRON DEFICIENCY; HEMORRHOIDS; ARTERIOVENOUS MALFORMATION; GERD; High cholesterol; and Acid reflux on her problem list.     is allergic to sulfonamide derivatives and latex.  Ms. Lesperance had no medications administered during this visit.  Past  Surgical History  Procedure Laterality Date  . Abdominal hysterectomy    . Appendectomy    . Cholecystectomy    . Bilateral breast biopsies  benign    . Colonoscopy w/ biopsies      Denies any headaches, dizziness, double vision, fevers, chills, night sweats, nausea, vomiting, diarrhea, constipation, chest pain, heart palpitations, shortness of breath, blood in stool, black tarry stool, urinary pain, urinary burning, urinary frequency, hematuria.   PHYSICAL EXAMINATION  ECOG PERFORMANCE STATUS: 1 - Symptomatic but completely ambulatory  Filed Vitals:   01/03/13 0914  BP: 126/80  Pulse: 87  Temp: 98.4 F (36.9 C)  Resp: 18    GENERAL:alert, healthy, no distress, well nourished, well developed, comfortable, cooperative and smiling  SKIN: skin color, texture, turgor are normal, no rashes or significant lesions.  See extremity exam. HEAD: Normocephalic, No masses, lesions, tenderness or abnormalities  EYES: normal, Conjunctiva are pink and non-injected  EARS: External ears normal  OROPHARYNX:lips, buccal mucosa, and tongue normal and mucous membranes are moist  NECK: supple, trachea midline  LYMPH: not examined  BREAST:not examined  LUNGS: clear to auscultation and percussion  HEART: regular rate & rhythm, no murmurs, no gallops, S1 normal and S2 normal  ABDOMEN:abdomen soft, non-tender and normal bowel sounds  BACK: Back symmetric, no curvature.  EXTREMITIES:less then 2 second capillary refill, no joint deformities, effusion, or inflammation, no skin discoloration, no clubbing, no cyanosis.  B/L hand papular erythematous, hyperpigmented rash that is pruritic at times with skin thickening. No abnormalities of right knee appreciated.  No effusion noted.  NEURO: alert & oriented x 3 with fluent speech, no focal motor/sensory deficits, gait normal    LABORATORY DATA: CBC    Component Value Date/Time   WBC 3.9* 01/03/2013 0904   RBC 3.72* 01/03/2013 0904   HGB 10.9* 01/03/2013  0904   HCT 33.7* 01/03/2013 0904   PLT 303 01/03/2013 0904   MCV 90.6 01/03/2013 0904   MCH 29.3 01/03/2013 0904   MCHC 32.3 01/03/2013 0904   RDW 16.0* 01/03/2013 0904   LYMPHSABS 1.3 01/03/2013 0904   MONOABS 0.3 01/03/2013 0904   EOSABS 0.1 01/03/2013 0904   BASOSABS 0.0 01/03/2013 0904      Chemistry      Component Value Date/Time   NA 141 01/03/2013 0904   K 3.4* 01/03/2013 0904   CL 104 01/03/2013 0904   CO2 30 01/03/2013 0904   BUN 10 01/03/2013 0904   CREATININE 1.02 01/03/2013 0904      Component Value Date/Time   CALCIUM 9.5 01/03/2013 0904   ALKPHOS 93 05/30/2012 0909   AST 30 05/30/2012 0909   ALT 20 05/30/2012 0909   BILITOT 0.2* 05/30/2012 0909     Lab Results  Component Value Date   IRON 37* 02/06/2012   TIBC 250 02/06/2012   FERRITIN 25 10/29/2012      PENDING LABS: CBC diff, BMET, Ferritin    ASSESSMENT:  1. Iron deficiency anemia, on oral iron pills and intermittent [name] infusion.   2. 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 Gwinda Passe at [facility]. This is the cause of #1 and she will likely continue to require Iron support due to chronic blood loss.  3. B/L knee pain, possibly arthritic pain, followed by PCP.  4. Vertigo, followed by PCP.   PLAN:  1. Patient had her mammogram performed today and the report is pending. 2. CBC diff, BMET, Ferritin pending from today. 3. I personally reviewed and went over laboratory results with the patient. 4. S/P [name] 1020 mg on 10/31/2013 5. Labs in 8 weeks and 16 weeks: CBC diff, Ferritin 6. BMET in 16 weeks 7. Will administer [name] IV 1020 mg per lab results 8. Referral to Dermatologist for hand rash x 1 month. 9. Return in 4 months for follow-up.    All questions were answered. The patient knows to call the clinic with any problems, questions or concerns. We can certainly see the patient much sooner if necessary.  Patient and plan will be discussed with Dr. Mariel Sleet in the near  future.   Langdon Crosson

## 2013-01-03 NOTE — Patient Instructions (Addendum)
.  [facility] Discharge Instructions  RECOMMENDATIONS MADE BY THE CONSULTANT AND ANY TEST RESULTS WILL BE SENT TO YOUR REFERRING PHYSICIAN.  EXAM FINDINGS BY THE PHYSICIAN TODAY AND SIGNS OR SYMPTOMS TO REPORT TO CLINIC OR PRIMARY PHYSICIAN: Exam per Jenita Seashore PA     INSTRUCTIONS GIVEN AND DISCUSSED: Labs in 2 months and 4 months  SPECIAL INSTRUCTIONS/FOLLOW-UP: Referral to Dr. Margo Aye  appt back to see [place] in 4 months  Thank you for choosing [facility] to provide your oncology and hematology care.  To afford each patient quality time with our providers, please arrive at least 15 minutes before your scheduled appointment time.  With your help, our goal is to use those 15 minutes to complete the necessary work-up to ensure our physicians have the information they need to help with your evaluation and healthcare recommendations.    Effective January 1st, 2014, we ask that you re-schedule your appointment with our physicians should you arrive 10 or more minutes late for your appointment.  We strive to give you quality time with our providers, and arriving late affects you and other patients whose appointments are after yours.    Again, thank you for choosing [facility].  Our hope is that these requests will decrease the amount of time that you wait before being seen by our physicians.       _____________________________________________________________  Should you have questions after your visit to [facility], please contact our office at [phone] between the hours of 8:30 a.m. and 5:00 p.m.  Voicemails left after 4:30 p.m. will not be returned until the following business day.  For prescription refill requests, have your pharmacy contact our office with your prescription refill request.

## 2013-01-31 ENCOUNTER — Ambulatory Visit (HOSPITAL_COMMUNITY): Payer: Medicare Other | Admitting: Oncology

## 2013-03-04 ENCOUNTER — Encounter (HOSPITAL_COMMUNITY): Payer: Medicare Other | Attending: Oncology

## 2013-03-04 DIAGNOSIS — D509 Iron deficiency anemia, unspecified: Secondary | ICD-10-CM

## 2013-03-04 LAB — CBC WITH DIFFERENTIAL/PLATELET
Basophils Absolute: 0 10*3/uL (ref 0.0–0.1)
Eosinophils Absolute: 0.2 10*3/uL (ref 0.0–0.7)
Eosinophils Relative: 4 % (ref 0–5)
HCT: 33 % — ABNORMAL LOW (ref 36.0–46.0)
MCH: 29.2 pg (ref 26.0–34.0)
MCHC: 32.1 g/dL (ref 30.0–36.0)
MCV: 90.9 fL (ref 78.0–100.0)
Monocytes Absolute: 0.3 10*3/uL (ref 0.1–1.0)
Platelets: 331 10*3/uL (ref 150–400)
RDW: 14.3 % (ref 11.5–15.5)

## 2013-03-04 LAB — FERRITIN: Ferritin: 87 ng/mL (ref 10–291)

## 2013-03-04 NOTE — Progress Notes (Signed)
Labs drawn today for cbc/diff,ferr 

## 2013-05-03 ENCOUNTER — Other Ambulatory Visit (HOSPITAL_COMMUNITY): Payer: Self-pay | Admitting: Oncology

## 2013-05-03 ENCOUNTER — Encounter (HOSPITAL_COMMUNITY): Payer: Medicare Other | Attending: Oncology

## 2013-05-03 DIAGNOSIS — D509 Iron deficiency anemia, unspecified: Secondary | ICD-10-CM

## 2013-05-03 DIAGNOSIS — E876 Hypokalemia: Secondary | ICD-10-CM

## 2013-05-03 LAB — CBC WITH DIFFERENTIAL/PLATELET
Basophils Absolute: 0 10*3/uL (ref 0.0–0.1)
Basophils Relative: 0 % (ref 0–1)
Eosinophils Absolute: 0.1 10*3/uL (ref 0.0–0.7)
Hemoglobin: 10.5 g/dL — ABNORMAL LOW (ref 12.0–15.0)
MCH: 28.8 pg (ref 26.0–34.0)
MCHC: 32.2 g/dL (ref 30.0–36.0)
Monocytes Absolute: 0.4 10*3/uL (ref 0.1–1.0)
Monocytes Relative: 7 % (ref 3–12)
Neutro Abs: 3.3 10*3/uL (ref 1.7–7.7)
Neutrophils Relative %: 67 % (ref 43–77)
RDW: 14.1 % (ref 11.5–15.5)

## 2013-05-03 LAB — BASIC METABOLIC PANEL
BUN: 8 mg/dL (ref 6–23)
Chloride: 104 mEq/L (ref 96–112)
Creatinine, Ser: 0.86 mg/dL (ref 0.50–1.10)
GFR calc Af Amer: 75 mL/min — ABNORMAL LOW (ref >90)
GFR calc non Af Amer: 65 mL/min — ABNORMAL LOW (ref >90)
Potassium: 3.2 mEq/L — ABNORMAL LOW (ref 3.5–5.1)

## 2013-05-03 LAB — FERRITIN: Ferritin: 28 ng/mL (ref 10–291)

## 2013-05-03 MED ORDER — POTASSIUM CHLORIDE CRYS ER 20 MEQ PO TBCR: 20.0000 meq | EXTENDED_RELEASE_TABLET | Freq: Every day | ORAL | Status: DC

## 2013-05-03 MED ORDER — SODIUM CHLORIDE 0.9 % IV SOLN: 1020.0000 mg | Freq: Once | INTRAVENOUS | Status: DC

## 2013-05-03 NOTE — Progress Notes (Signed)
Labs drawn today for cbc/diff,bmp,ferr

## 2013-05-06 ENCOUNTER — Encounter (HOSPITAL_BASED_OUTPATIENT_CLINIC_OR_DEPARTMENT_OTHER): Payer: Medicare Other | Admitting: Oncology

## 2013-05-06 ENCOUNTER — Encounter (HOSPITAL_COMMUNITY): Payer: Self-pay | Admitting: Oncology

## 2013-05-06 ENCOUNTER — Encounter (HOSPITAL_BASED_OUTPATIENT_CLINIC_OR_DEPARTMENT_OTHER): Payer: Medicare Other

## 2013-05-06 VITALS — BP 93/63 | HR 86 | Temp 98.6°F | Resp 16 | Wt 148.3 lb

## 2013-05-06 DIAGNOSIS — D509 Iron deficiency anemia, unspecified: Secondary | ICD-10-CM

## 2013-05-06 MED ORDER — SODIUM CHLORIDE 0.9 % IV SOLN
1020.0000 mg | Freq: Once | INTRAVENOUS | Status: AC
  Administered 2013-05-06: 1020 mg via INTRAVENOUS
  Filled 2013-05-06: qty 34

## 2013-05-06 NOTE — Progress Notes (Signed)
Tolerated iron infusion well. 

## 2013-05-06 NOTE — Progress Notes (Signed)
#  1 iron deficiency anemia without sustaining ferritin levels with oral iron. She requires IV feraheme as needed with her last infusion in 10/31/2012 and she is once again in need of an infusion. I will stop her oral iron. She occasionally sees blood on the paper and rarely a dark stool but that is potentially from oral iron. 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 She is on no anti-inflammatories over-the-counter. Oncology review of systems is noncontributory. Weight is stable. Other vital signs are stable.  She has no lymphadenopathy. She is alert and oriented. Lungs are clear. Heart shows a regular rhythm and rate without murmur rub or gallop. Abdomen is soft and nontender without organomegaly. Breast exam is negative for masses. Both reduction scars are present and well-healed. She has no leg edema or arm edema. Heart shows no murmur rub or gallop. Abdomen remains soft and nontender without hepatosplenomegaly. Bowel sounds are diminished. Lungs are clear but she was a smoker stopped 3 years ago and she has adequate breath sounds.  Skin exam is unremarkable.  I suspect she still has ongoing AV malformation blood loss. I do discuss this with Dr. Darrick Penna who I will contact. She had her last GI workup in June 2010.  Since he oral iron is doing nothing but constipating her not helping him maintain her ferritin level I will stop that. We will see her back every 12 weeks for blood work and see her here in the clinic in 6 months.

## 2013-05-06 NOTE — Patient Instructions (Addendum)
[facility] Discharge Instructions  RECOMMENDATIONS MADE BY THE CONSULTANT AND ANY TEST RESULTS WILL BE SENT TO YOUR REFERRING PHYSICIAN.  EXAM FINDINGS BY THE PHYSICIAN TODAY AND SIGNS OR SYMPTOMS TO REPORT TO CLINIC OR PRIMARY PHYSICIAN: Exam and discussion by MD.  We will give you iron today and will check labs every 3 months.  Dr. Mariel Sleet will talk with Dr. Darrick Penna.  If we need to do anything we will let you know.  MEDICATIONS PRESCRIBED:  Stop the oral iron.  INSTRUCTIONS GIVEN AND DISCUSSED: Report increased fatigue, shortness of breath or increased ice intake.  SPECIAL INSTRUCTIONS/FOLLOW-UP: Blood work every 3 months and to be seen in follow-up after blood work in 6 months.  Thank you for choosing [facility] to provide your oncology and hematology care.  To afford each patient quality time with our providers, please arrive at least 15 minutes before your scheduled appointment time.  With your help, our goal is to use those 15 minutes to complete the necessary work-up to ensure our physicians have the information they need to help with your evaluation and healthcare recommendations.    Effective January 1st, 2014, we ask that you re-schedule your appointment with our physicians should you arrive 10 or more minutes late for your appointment.  We strive to give you quality time with our providers, and arriving late affects you and other patients whose appointments are after yours.    Again, thank you for choosing [facility].  Our hope is that these requests will decrease the amount of time that you wait before being seen by our physicians.       _____________________________________________________________  Should you have questions after your visit to [facility], please contact our office at [phone] between the hours of 8:30 a.m. and 5:00 p.m.  Voicemails left after 4:30 p.m. will not be returned until the following  business day.  For prescription refill requests, have your pharmacy contact our office with your prescription refill request.

## 2013-05-07 ENCOUNTER — Encounter (HOSPITAL_COMMUNITY): Payer: Self-pay | Admitting: *Deleted

## 2013-05-07 ENCOUNTER — Other Ambulatory Visit: Payer: Self-pay

## 2013-05-07 ENCOUNTER — Emergency Department (HOSPITAL_COMMUNITY)
Admission: EM | Admit: 2013-05-07 | Discharge: 2013-05-07 | Disposition: A | Discharge status: Home / Self Care | Payer: Medicare Other | Attending: Emergency Medicine | Admitting: Emergency Medicine

## 2013-05-07 DIAGNOSIS — Z87891 Personal history of nicotine dependence: Secondary | ICD-10-CM | POA: Insufficient documentation

## 2013-05-07 DIAGNOSIS — Z8709 Personal history of other diseases of the respiratory system: Secondary | ICD-10-CM | POA: Insufficient documentation

## 2013-05-07 DIAGNOSIS — R5381 Other malaise: Secondary | ICD-10-CM | POA: Insufficient documentation

## 2013-05-07 DIAGNOSIS — M129 Arthropathy, unspecified: Secondary | ICD-10-CM | POA: Insufficient documentation

## 2013-05-07 DIAGNOSIS — E7800 Pure hypercholesterolemia, unspecified: Secondary | ICD-10-CM | POA: Insufficient documentation

## 2013-05-07 DIAGNOSIS — Z872 Personal history of diseases of the skin and subcutaneous tissue: Secondary | ICD-10-CM | POA: Insufficient documentation

## 2013-05-07 DIAGNOSIS — Z8669 Personal history of other diseases of the nervous system and sense organs: Secondary | ICD-10-CM | POA: Insufficient documentation

## 2013-05-07 DIAGNOSIS — K219 Gastro-esophageal reflux disease without esophagitis: Secondary | ICD-10-CM | POA: Insufficient documentation

## 2013-05-07 DIAGNOSIS — R5383 Other fatigue: Secondary | ICD-10-CM | POA: Insufficient documentation

## 2013-05-07 DIAGNOSIS — Z8701 Personal history of pneumonia (recurrent): Secondary | ICD-10-CM | POA: Insufficient documentation

## 2013-05-07 DIAGNOSIS — D649 Anemia, unspecified: Secondary | ICD-10-CM | POA: Insufficient documentation

## 2013-05-07 DIAGNOSIS — Z91040 Latex allergy status: Secondary | ICD-10-CM | POA: Insufficient documentation

## 2013-05-07 DIAGNOSIS — I10 Essential (primary) hypertension: Secondary | ICD-10-CM | POA: Insufficient documentation

## 2013-05-07 DIAGNOSIS — K921 Melena: Secondary | ICD-10-CM | POA: Insufficient documentation

## 2013-05-07 DIAGNOSIS — R531 Weakness: Secondary | ICD-10-CM

## 2013-05-07 DIAGNOSIS — Z79899 Other long term (current) drug therapy: Secondary | ICD-10-CM | POA: Insufficient documentation

## 2013-05-07 DIAGNOSIS — Z8719 Personal history of other diseases of the digestive system: Secondary | ICD-10-CM | POA: Insufficient documentation

## 2013-05-07 HISTORY — DX: Diaphragmatic hernia without obstruction or gangrene: K44.9

## 2013-05-07 HISTORY — DX: Dizziness and giddiness: R42

## 2013-05-07 LAB — CBC WITH DIFFERENTIAL/PLATELET
Basophils Absolute: 0 10*3/uL (ref 0.0–0.1)
Basophils Relative: 0 % (ref 0–1)
Eosinophils Absolute: 0.1 10*3/uL (ref 0.0–0.7)
Eosinophils Relative: 2 % (ref 0–5)
HCT: 32.4 % — ABNORMAL LOW (ref 36.0–46.0)
MCH: 28.5 pg (ref 26.0–34.0)
MCHC: 32.1 g/dL (ref 30.0–36.0)
MCV: 88.8 fL (ref 78.0–100.0)
Monocytes Absolute: 0.3 10*3/uL (ref 0.1–1.0)
Platelets: 343 10*3/uL (ref 150–400)
RDW: 14.2 % (ref 11.5–15.5)

## 2013-05-07 LAB — POCT I-STAT TROPONIN I

## 2013-05-07 LAB — BASIC METABOLIC PANEL
CO2: 30 mEq/L (ref 19–32)
Calcium: 10 mg/dL (ref 8.4–10.5)
Creatinine, Ser: 0.81 mg/dL (ref 0.50–1.10)
GFR calc non Af Amer: 70 mL/min — ABNORMAL LOW (ref >90)

## 2013-05-07 LAB — OCCULT BLOOD, POC DEVICE: Fecal Occult Bld: NEGATIVE

## 2013-05-07 MED ORDER — ACETAMINOPHEN 325 MG PO TABS
650.0000 mg | ORAL_TABLET | Freq: Once | ORAL | Status: AC
  Administered 2013-05-07: 650 mg via ORAL
  Filled 2013-05-07: qty 2

## 2013-05-07 NOTE — ED Notes (Signed)
Pt c/o headache, Dr. Rennis Chris notified,

## 2013-05-07 NOTE — ED Notes (Signed)
Pt given lunch tray, updated on plan of care

## 2013-05-07 NOTE — ED Notes (Signed)
Pt presents to by [facility] with c/o dizziness, pt states that she was performing house work at home when she suddenly developed dizziness with nausea. Pt states that once she went to lay down the dizziness resolved, denies any headache, vomiting, problems with vision or ears, chest pain, sob, pt went to pcp in [place] and was sent to er for further evaluation, pt states that she has had problems with vertigo before and felt the same way as today, on arrival to er pt alert, able to answer all questions, denies any pain, dizziness is gone when pt is laying still, returns with sitting up, does admit to generalized weakness. Pt states that she did have an iron transfusion yesterday at the specialty clinics

## 2013-05-07 NOTE — ED Notes (Signed)
EDP at bedside,  

## 2013-05-07 NOTE — ED Provider Notes (Signed)
History    CSN: [id number] Arrival date & time 05/07/13  1335  None    Chief Complaint  Patient presents with  . Dizziness   (Consider location/radiation/quality/duration/timing/severity/associated sxs/prior Treatment) HPI Complains of lightheadedness with standing onset 11 AM today lasted 20 minutes resolved spontaneously. EMS treated patient with supplemental oxygen prior to coming here. No sense of vertigo. No headache no chest pain no abdominal pain no shortness of breath patient now asymptomatic. Patient was seen by PMD earlier today sent here for further evaluation. Past Medical History  Diagnosis Date  . High cholesterol   . Acid reflux   . Hx: recurrent pneumonia   . Anemia   . History of contact dermatitis   . Arthritis   . Bronchitis january 2014  . Hypertension   . Vertigo   . Hiatal hernia    Past Surgical History  Procedure Laterality Date  . Abdominal hysterectomy    . Appendectomy    . Cholecystectomy    . Bilateral breast biopsies  benign    . Colonoscopy w/ biopsies     Family History  Problem Relation Age of Onset  . Heart failure Mother   . Diabetes Sister    History  Substance Use Topics  . Smoking status: Former Games developer  . Smokeless tobacco: Never Used  . Alcohol Use: No   OB History   Grav Para Term Preterm Abortions TAB SAB Ect Mult Living                 Review of Systems  Constitutional: Negative.   HENT: Negative.   Respiratory: Negative.   Cardiovascular: Negative.   Gastrointestinal: Positive for blood in stool.       Blood in stool 2 weeks ago  Musculoskeletal: Negative.   Skin: Negative.   Neurological: Positive for light-headedness.  Psychiatric/Behavioral: Negative.   All other systems reviewed and are negative.    Allergies  Sulfonamide derivatives and Latex  Home Medications   Current Outpatient Rx  Name  Route  Sig  Dispense  Refill  . acetaminophen (TYLENOL) 500 MG tablet   Oral   Take 500 mg by mouth  every 6 (six) hours as needed. For pain         . amitriptyline (ELAVIL) 25 MG tablet   Oral   Take 25 mg by mouth at bedtime as needed.         . Artificial Tear Ointment (DRY EYES OP)   Both Eyes   Place 1 drop into both eyes daily.          . cyanocobalamin (,VITAMIN B-12,) 1000 MCG/ML injection   Intramuscular   Inject 1,000 mcg into the muscle once. Taken around the 10th of each month         . Docusate Calcium (STOOL SOFTENER PO)   Oral   Take 1 tablet by mouth as needed. For constipation         . escitalopram (LEXAPRO) 10 MG tablet   Oral   Take 5 mg by mouth daily.          Marland Kitchen esomeprazole (NEXIUM) 40 MG capsule   Oral   Take 40 mg by mouth daily before breakfast.           . fish oil-omega-3 fatty acids 1000 MG capsule   Oral   Take 2 g by mouth daily.         . fluticasone (FLONASE) 50 MCG/ACT nasal spray   Nasal  Place 1 spray into the nose as needed.         . folic acid (FOLVITE) 1 MG tablet   Oral   Take 1 mg by mouth daily.           Marland Kitchen loratadine (CLARITIN) 10 MG tablet   Oral   Take 10 mg by mouth daily. If takes Claritin D does not take this         . losartan (COZAAR) 25 MG tablet   Oral   Take 25 mg by mouth daily.         . meclizine (ANTIVERT) 50 MG tablet   Oral   Take 50 mg by mouth 3 (three) times daily as needed. For vertigo         . Multiple Vitamin (MULTIVITAMIN) capsule   Oral   Take 1 capsule by mouth daily.           . potassium chloride SA (K-DUR,KLOR-CON) 20 MEQ tablet   Oral   Take 1 tablet (20 mEq total) by mouth daily.   30 tablet   0   . sucralfate (CARAFATE) 1 GM/10ML suspension   Oral   Take 1 g by mouth as needed. For increased stomach acid         . traMADol (ULTRAM) 50 MG tablet   Oral   Take 50 mg by mouth every 6 (six) hours as needed. For pain          BP 130/83  Pulse 67  Temp(Src) 99 F (37.2 C)  Resp 20  Ht 5\' 4"  (1.626 m)  Wt 148 lb (67.132 kg)  BMI 25.39 kg/m2   SpO2 95% Physical Exam  Nursing note and vitals reviewed. Constitutional: She is oriented to person, place, and time. She appears well-developed and well-nourished. No distress.  HENT:  Head: Normocephalic and atraumatic.  Eyes: Conjunctivae are normal. Pupils are equal, round, and reactive to light.  Neck: Neck supple. No tracheal deviation present. No thyromegaly present.  Cardiovascular: Normal rate and regular rhythm.   No murmur heard. Pulmonary/Chest: Effort normal and breath sounds normal.  Abdominal: Soft. Bowel sounds are normal. She exhibits no distension. There is no tenderness.  Genitourinary: Guaiac negative stool.  Musculoskeletal: Normal range of motion. She exhibits no edema and no tenderness.  Neurological: She is alert and oriented to person, place, and time. No cranial nerve deficit. Coordination normal.  Gait normal not lightheaded on standing  Skin: Skin is warm and dry. No rash noted.  Psychiatric: She has a normal mood and affect.    ED Course  Procedures (including critical care time) Labs Reviewed - No data to display No results found. No diagnosis found.  Date: 05/07/2013  Rate: 70  Rhythm: normal sinus rhythm  QRS Axis: normal  Intervals: normal  ST/T Wave abnormalities: normal  Conduction Disutrbances: none  Narrative Interpretation: unremarkable Unchanged from EKG performed today at 12:38 PM Results for orders placed during the hospital encounter of 05/07/13  CBC WITH DIFFERENTIAL      Result Value Range   WBC 4.8  4.0 - 10.5 K/uL   RBC 3.65 (*) 3.87 - 5.11 MIL/uL   Hemoglobin 10.4 (*) 12.0 - 15.0 g/dL   HCT 78.2 (*) 95.6 - 21.3 %   MCV 88.8  78.0 - 100.0 fL   MCH 28.5  26.0 - 34.0 pg   MCHC 32.1  30.0 - 36.0 g/dL   RDW 08.6  57.8 - 46.9 %   Platelets 343  150 - 400 K/uL   Neutrophils Relative % 62  43 - 77 %   Neutro Abs 3.0  1.7 - 7.7 K/uL   Lymphocytes Relative 30  12 - 46 %   Lymphs Abs 1.5  0.7 - 4.0 K/uL   Monocytes Relative 6  3  - 12 %   Monocytes Absolute 0.3  0.1 - 1.0 K/uL   Eosinophils Relative 2  0 - 5 %   Eosinophils Absolute 0.1  0.0 - 0.7 K/uL   Basophils Relative 0  0 - 1 %   Basophils Absolute 0.0  0.0 - 0.1 K/uL  BASIC METABOLIC PANEL      Result Value Range   Sodium 141  135 - 145 mEq/L   Potassium 3.7  3.5 - 5.1 mEq/L   Chloride 102  96 - 112 mEq/L   CO2 30  19 - 32 mEq/L   Glucose, Bld 90  70 - 99 mg/dL   BUN 9  6 - 23 mg/dL   Creatinine, Ser 9.60  0.50 - 1.10 mg/dL   Calcium 45.4  8.4 - 09.8 mg/dL   GFR calc non Af Amer 70 (*) >90 mL/min   GFR calc Af Amer 81 (*) >90 mL/min  OCCULT BLOOD, POC DEVICE      Result Value Range   Fecal Occult Bld NEGATIVE  NEGATIVE  POCT I-STAT TROPONIN I      Result Value Range   Troponin i, poc 0.01  0.00 - 0.08 ng/mL   Comment 3           POCT I-STAT TROPONIN I      Result Value Range   Troponin i, poc 0.01  0.00 - 0.08 ng/mL   Comment 3            No results found.  Patient remained asymptomatic throughout her entire emergency department stay in a normal sinus rhythm. MDM  Etiology transient weakness is unclear. There was no chest pain. She's had 2 negative cardiac markers. Normal EKG. She is advised to follow up with her primary care physician tomorrow Diagnosis transient weakness  Doug Sou, MD 05/07/13 [phone]

## 2013-08-06 ENCOUNTER — Encounter (HOSPITAL_COMMUNITY): Payer: PRIVATE HEALTH INSURANCE | Attending: Hematology and Oncology

## 2013-08-06 DIAGNOSIS — D509 Iron deficiency anemia, unspecified: Secondary | ICD-10-CM | POA: Insufficient documentation

## 2013-08-06 LAB — CBC
HCT: 35.3 % — ABNORMAL LOW (ref 36.0–46.0)
Hemoglobin: 11.1 g/dL — ABNORMAL LOW (ref 12.0–15.0)
MCHC: 31.4 g/dL (ref 30.0–36.0)
MCV: 92.7 fL (ref 78.0–100.0)

## 2013-08-06 NOTE — Progress Notes (Signed)
Labs drawn today for cbc,ferr 

## 2013-09-19 ENCOUNTER — Encounter (HOSPITAL_COMMUNITY): Payer: Self-pay | Admitting: Emergency Medicine

## 2013-09-19 ENCOUNTER — Emergency Department (HOSPITAL_COMMUNITY): Payer: Medicare Other

## 2013-09-19 ENCOUNTER — Emergency Department (HOSPITAL_COMMUNITY)
Admission: EM | Admit: 2013-09-19 | Discharge: 2013-09-19 | Disposition: A | Discharge status: Home / Self Care | Payer: Medicare Other | Attending: Emergency Medicine | Admitting: Emergency Medicine

## 2013-09-19 DIAGNOSIS — R531 Weakness: Secondary | ICD-10-CM

## 2013-09-19 DIAGNOSIS — Z8709 Personal history of other diseases of the respiratory system: Secondary | ICD-10-CM | POA: Insufficient documentation

## 2013-09-19 DIAGNOSIS — R110 Nausea: Secondary | ICD-10-CM | POA: Diagnosis not present

## 2013-09-19 DIAGNOSIS — K219 Gastro-esophageal reflux disease without esophagitis: Secondary | ICD-10-CM | POA: Insufficient documentation

## 2013-09-19 DIAGNOSIS — Z79899 Other long term (current) drug therapy: Secondary | ICD-10-CM | POA: Diagnosis not present

## 2013-09-19 DIAGNOSIS — I10 Essential (primary) hypertension: Secondary | ICD-10-CM | POA: Diagnosis not present

## 2013-09-19 DIAGNOSIS — Z87891 Personal history of nicotine dependence: Secondary | ICD-10-CM | POA: Insufficient documentation

## 2013-09-19 DIAGNOSIS — Z91040 Latex allergy status: Secondary | ICD-10-CM | POA: Diagnosis not present

## 2013-09-19 DIAGNOSIS — R42 Dizziness and giddiness: Secondary | ICD-10-CM | POA: Diagnosis not present

## 2013-09-19 DIAGNOSIS — R059 Cough, unspecified: Secondary | ICD-10-CM | POA: Insufficient documentation

## 2013-09-19 DIAGNOSIS — R5381 Other malaise: Secondary | ICD-10-CM | POA: Insufficient documentation

## 2013-09-19 DIAGNOSIS — D649 Anemia, unspecified: Secondary | ICD-10-CM | POA: Diagnosis not present

## 2013-09-19 DIAGNOSIS — R21 Rash and other nonspecific skin eruption: Secondary | ICD-10-CM | POA: Insufficient documentation

## 2013-09-19 DIAGNOSIS — R51 Headache: Secondary | ICD-10-CM | POA: Insufficient documentation

## 2013-09-19 DIAGNOSIS — M129 Arthropathy, unspecified: Secondary | ICD-10-CM | POA: Insufficient documentation

## 2013-09-19 DIAGNOSIS — J3489 Other specified disorders of nose and nasal sinuses: Secondary | ICD-10-CM | POA: Insufficient documentation

## 2013-09-19 DIAGNOSIS — Z862 Personal history of diseases of the blood and blood-forming organs and certain disorders involving the immune mechanism: Secondary | ICD-10-CM | POA: Insufficient documentation

## 2013-09-19 DIAGNOSIS — Z8701 Personal history of pneumonia (recurrent): Secondary | ICD-10-CM | POA: Insufficient documentation

## 2013-09-19 DIAGNOSIS — R05 Cough: Secondary | ICD-10-CM | POA: Insufficient documentation

## 2013-09-19 DIAGNOSIS — Z872 Personal history of diseases of the skin and subcutaneous tissue: Secondary | ICD-10-CM | POA: Insufficient documentation

## 2013-09-19 DIAGNOSIS — Z8639 Personal history of other endocrine, nutritional and metabolic disease: Secondary | ICD-10-CM | POA: Insufficient documentation

## 2013-09-19 DIAGNOSIS — E876 Hypokalemia: Secondary | ICD-10-CM

## 2013-09-19 HISTORY — DX: Reserved for inherently not codable concepts without codable children: IMO0001

## 2013-09-19 HISTORY — DX: Gastro-esophageal reflux disease without esophagitis: K21.9

## 2013-09-19 LAB — COMPREHENSIVE METABOLIC PANEL
ALT: 15 U/L (ref 0–35)
Alkaline Phosphatase: 79 U/L (ref 39–117)
CO2: 29 mEq/L (ref 19–32)
Calcium: 9.5 mg/dL (ref 8.4–10.5)
GFR calc Af Amer: 82 mL/min — ABNORMAL LOW (ref >90)
GFR calc non Af Amer: 71 mL/min — ABNORMAL LOW (ref >90)
Glucose, Bld: 80 mg/dL (ref 70–99)
Potassium: 3.2 mEq/L — ABNORMAL LOW (ref 3.5–5.1)
Sodium: 140 mEq/L (ref 135–145)

## 2013-09-19 LAB — CBC WITH DIFFERENTIAL/PLATELET
Basophils Absolute: 0 10*3/uL (ref 0.0–0.1)
Basophils Relative: 0 % (ref 0–1)
MCHC: 32.3 g/dL (ref 30.0–36.0)
Neutro Abs: 1.9 10*3/uL (ref 1.7–7.7)
Neutrophils Relative %: 49 % (ref 43–77)
RDW: 13.9 % (ref 11.5–15.5)

## 2013-09-19 MED ORDER — ONDANSETRON HCL 4 MG/2ML IJ SOLN
4.0000 mg | Freq: Once | INTRAMUSCULAR | Status: AC
  Administered 2013-09-19: 4 mg via INTRAVENOUS
  Filled 2013-09-19: qty 2

## 2013-09-19 MED ORDER — SODIUM CHLORIDE 0.9 % IV SOLN
INTRAVENOUS | Status: DC
  Administered 2013-09-19: 14:00:00 via INTRAVENOUS

## 2013-09-19 MED ORDER — POTASSIUM CHLORIDE CRYS ER 20 MEQ PO TBCR: 20.0000 meq | EXTENDED_RELEASE_TABLET | Freq: Two times a day (BID) | ORAL | Status: DC

## 2013-09-19 MED ORDER — HYDROMORPHONE HCL PF 1 MG/ML IJ SOLN
0.5000 mg | Freq: Once | INTRAMUSCULAR | Status: AC
  Administered 2013-09-19: 0.5 mg via INTRAVENOUS
  Filled 2013-09-19: qty 1

## 2013-09-19 NOTE — ED Provider Notes (Signed)
CSN: [id number]     Arrival date & time 09/19/13  1019 History  This chart was scribed for Shelda Jakes, MD,  by Ashley Jacobs, ED Scribe. The patient was seen in room [facility]16A/[facility]16A and the patient's care was started at 12:51 PM.   First MD Initiated Contact with Patient 09/19/13 1232     Chief Complaint  Patient presents with  . Weakness   (Consider location/radiation/quality/duration/timing/severity/associated sxs/prior Treatment) The history is provided by the patient and medical records. No language interpreter was used.   HPI Comments: JOSSELINE REDDIN is a 72 y.o. female who presents to the Emergency Department complaining of weakness since yesterday. She is experiencing the associated symptoms of lightheaded that started yesterday, as well as, constant nausea and headaches(frontal region) that started today.Pt has an ongoing dry cough and mild sinus pressure. She also is experiencing a rash on the dorsal aspect of her bilateral hands.  Pt denies dizziness and vomiting. She has a past medical hx of high cholesterol, acid reflux, recurrent pneumonia, anemia, dermatitis, and HTN. Pt has allergies to the Sulfonamide derivatives and latex.  Past Medical History  Diagnosis Date  . High cholesterol   . Acid reflux   . Hx: recurrent pneumonia   . Anemia   . History of contact dermatitis   . Arthritis   . Bronchitis january 2014  . Hypertension   . Vertigo   . Hiatal hernia   . Reflux    Past Surgical History  Procedure Laterality Date  . Abdominal hysterectomy    . Appendectomy    . Cholecystectomy    . Bilateral breast biopsies  benign    . Colonoscopy w/ biopsies     Family History  Problem Relation Age of Onset  . Heart failure Mother   . Diabetes Sister    History  Substance Use Topics  . Smoking status: Former Games developer  . Smokeless tobacco: Never Used  . Alcohol Use: No   OB History   Grav Para Term Preterm Abortions TAB SAB Ect Mult Living                  Review of Systems  Constitutional: Negative for fever and chills.  HENT: Positive for sinus pressure. Negative for congestion.   Eyes: Negative for visual disturbance.  Respiratory: Positive for cough (dry). Negative for shortness of breath.   Cardiovascular: Negative for chest pain and leg swelling.  Gastrointestinal: Positive for nausea. Negative for vomiting, abdominal pain and diarrhea.  Genitourinary: Negative for dysuria.  Musculoskeletal: Negative for back pain and neck pain.  Skin: Positive for rash (right hand).  Neurological: Positive for dizziness, weakness, light-headedness and headaches. Negative for numbness.  Hematological: Does not bruise/bleed easily.  Psychiatric/Behavioral: Negative for confusion.  All other systems reviewed and are negative.    Allergies  Sulfonamide derivatives and Latex  Home Medications   Current Outpatient Rx  Name  Route  Sig  Dispense  Refill  . acetaminophen (TYLENOL) 500 MG tablet   Oral   Take 500 mg by mouth every 6 (six) hours as needed. For pain         . amitriptyline (ELAVIL) 25 MG tablet   Oral   Take 25 mg by mouth at bedtime as needed for sleep.          . cyanocobalamin 1000 MCG tablet   Oral   Take 100 mcg by mouth daily.         Marland Kitchen docusate sodium (COLACE) 100  MG capsule   Oral   Take 100 mg by mouth 2 (two) times daily as needed for mild constipation or moderate constipation.         Marland Kitchen esomeprazole (NEXIUM) 40 MG capsule   Oral   Take 40 mg by mouth daily before breakfast.           . fluticasone (FLONASE) 50 MCG/ACT nasal spray   Nasal   Place 1 spray into the nose daily as needed for allergies.          . folic acid (FOLVITE) 1 MG tablet   Oral   Take 1 mg by mouth daily.           Marland Kitchen losartan (COZAAR) 25 MG tablet   Oral   Take 25 mg by mouth daily.         . meclizine (ANTIVERT) 25 MG tablet   Oral   Take 25-50 mg by mouth 3 (three) times daily as needed for dizziness.          . methylcellulose (ARTIFICIAL TEARS) 1 % ophthalmic solution   Both Eyes   Place 1 drop into both eyes 2 (two) times daily as needed (dry eyes).         . Multiple Vitamin (MULTIVITAMIN) capsule   Oral   Take 1 capsule by mouth daily.           . traMADol (ULTRAM) 50 MG tablet   Oral   Take 50 mg by mouth every 6 (six) hours as needed. For pain         . fish oil-omega-3 fatty acids 1000 MG capsule   Oral   Take 1 g by mouth daily.          . potassium chloride SA (K-DUR,KLOR-CON) 20 MEQ tablet   Oral   Take 1 tablet (20 mEq total) by mouth 2 (two) times daily.   4 tablet   0    BP 132/90  Pulse 87  Temp(Src) 98.7 F (37.1 C) (Oral)  Resp 20  Ht 5\' 4"  (1.626 m)  Wt 140 lb (63.504 kg)  BMI 24.02 kg/m2  SpO2 96% Physical Exam  Nursing note and vitals reviewed. Constitutional: She appears well-developed and well-nourished. No distress.  Cardiovascular: Normal rate, regular rhythm and normal heart sounds.  Exam reveals no gallop.   No murmur heard. Skin:  pigment change and dryness on dorsal hands, right hand worse than left    ED Course  Procedures (including critical care time) DIAGNOSTIC STUDIES: Oxygen Saturation is 96% on room air, normal by my interpretation.    COORDINATION OF CARE: 12:54 PM Discussed course of care with pt which includes EKG. Pt understands and agrees.  Labs Review Labs Reviewed  COMPREHENSIVE METABOLIC PANEL - Abnormal; Notable for the following:    Potassium 3.2 (*)    Total Bilirubin 0.1 (*)    GFR calc non Af Amer 71 (*)    GFR calc Af Amer 82 (*)    All other components within normal limits  CBC WITH DIFFERENTIAL - Abnormal; Notable for the following:    WBC 3.9 (*)    RBC 3.50 (*)    Hemoglobin 10.2 (*)    HCT 31.6 (*)    All other components within normal limits  URINALYSIS, ROUTINE W REFLEX MICROSCOPIC   Results for orders placed during the hospital encounter of 09/19/13  COMPREHENSIVE METABOLIC PANEL       Result Value Range   Sodium 140  135 - 145 mEq/L   Potassium 3.2 (*) 3.5 - 5.1 mEq/L   Chloride 103  96 - 112 mEq/L   CO2 29  19 - 32 mEq/L   Glucose, Bld 80  70 - 99 mg/dL   BUN 7  6 - 23 mg/dL   Creatinine, Ser 6.21  0.50 - 1.10 mg/dL   Calcium 9.5  8.4 - 30.8 mg/dL   Total Protein 7.1  6.0 - 8.3 g/dL   Albumin 3.5  3.5 - 5.2 g/dL   AST 22  0 - 37 U/L   ALT 15  0 - 35 U/L   Alkaline Phosphatase 79  39 - 117 U/L   Total Bilirubin 0.1 (*) 0.3 - 1.2 mg/dL   GFR calc non Af Amer 71 (*) >90 mL/min   GFR calc Af Amer 82 (*) >90 mL/min  CBC WITH DIFFERENTIAL      Result Value Range   WBC 3.9 (*) 4.0 - 10.5 K/uL   RBC 3.50 (*) 3.87 - 5.11 MIL/uL   Hemoglobin 10.2 (*) 12.0 - 15.0 g/dL   HCT 65.7 (*) 84.6 - 96.2 %   MCV 90.3  78.0 - 100.0 fL   MCH 29.1  26.0 - 34.0 pg   MCHC 32.3  30.0 - 36.0 g/dL   RDW 95.2  84.1 - 32.4 %   Platelets 289  150 - 400 K/uL   Neutrophils Relative % 49  43 - 77 %   Neutro Abs 1.9  1.7 - 7.7 K/uL   Lymphocytes Relative 40  12 - 46 %   Lymphs Abs 1.5  0.7 - 4.0 K/uL   Monocytes Relative 8  3 - 12 %   Monocytes Absolute 0.3  0.1 - 1.0 K/uL   Eosinophils Relative 2  0 - 5 %   Eosinophils Absolute 0.1  0.0 - 0.7 K/uL   Basophils Relative 0  0 - 1 %   Basophils Absolute 0.0  0.0 - 0.1 K/uL    Imaging Review Dg Chest 2 View  09/19/2013   CLINICAL DATA:  Cough for 2 days. Weakness hypertension. Previous smoker.  EXAM: CHEST  2 VIEW  COMPARISON:  01/05/2011 and 06/19/2010  FINDINGS: Heart size is upper normal and stable. Thoracic aorta is tortuous and stable. Stable small calcified granuloma left upper lung field versus vessel in the, unchanged compared to chest radiograph of 2009. No airspace disease is visualized. Negative for pleural effusion or pneumothorax.  Cholecystectomy clips noted.  IMPRESSION: No active cardiopulmonary disease.   Electronically Signed   By: Britta Mccreedy M.D.   On: 09/19/2013 14:08   Ct Head Wo Contrast  09/19/2013   CLINICAL DATA:   History of weakness and light-headedness. History of constant nausea and headaches. Frontal headaches. Mild sinus pressure. History of hypertension.  EXAM: CT HEAD WITHOUT CONTRAST  TECHNIQUE: Contiguous axial images were obtained from the base of the skull through the vertex without intravenous contrast.  COMPARISON:  05/30/2012.  FINDINGS: There is no evidence of brain mass, brain hemorrhage, or acute infarction.  The ventricular system is normal size and shape. There is no evidence of shift of midline structures, parenchymal lesion, or subdural or epidural hematoma.  The calvarium is intact. Right mastoid air cells are well aerated. Some of the left mastoid air cells are well aerated. There is some a air cells which have a hazy appearance which is unchanged. No septal destruction is evident. No sinusitis is evident.  IMPRESSION: There is no evidence  of brain mass, brain hemorrhage, or acute infarction.  No acute or active brain process is seen. No skull lesion is evident. No sinusitis is evident. Slight chronic haziness of some of the left mastoid air cells. No septal destruction is evident.   Electronically Signed   By: Onalee Hua  Call M.D.   On: 09/19/2013 14:32    EKG Interpretation     Ventricular Rate:  82 PR Interval:  146 QRS Duration: 88 QT Interval:  372 QTC Calculation: 434 R Axis:   13 Text Interpretation:  Normal sinus rhythm Normal ECG When compared with ECG of 07-May-2013 13:31, No significant change was found            MDM   1. Weakness   2. Hypokalemia    Patient's workup for headache generalized weakness and fatigue lightheadedness is negative. No acute EKG changes. No history of chest pain. Head CT is negative mild hypokalemia normal renal function we'll treat with potassium supplement for a few days. Patient primary care Dr. followup with. Patient is nontoxic no acute distress. Chest x-rays negative for pneumonia pulmonary edema or pneumothorax. Head CT was also  negative for sinusitis.   I personally performed the services described in this documentation, which was scribed in my presence. The recorded information has been reviewed and is accurate.       Shelda Jakes, MD 09/19/13 [phone]

## 2013-09-19 NOTE — ED Notes (Signed)
Pt says she has felt weak and light headed  Since yesterday.Headache.  No NVD.

## 2013-11-01 ENCOUNTER — Other Ambulatory Visit (HOSPITAL_COMMUNITY): Payer: Self-pay | Admitting: Oncology

## 2013-11-01 ENCOUNTER — Encounter (HOSPITAL_COMMUNITY): Payer: Medicare Other | Attending: Hematology and Oncology

## 2013-11-01 DIAGNOSIS — D509 Iron deficiency anemia, unspecified: Secondary | ICD-10-CM | POA: Insufficient documentation

## 2013-11-01 LAB — CBC
Hemoglobin: 9.2 g/dL — ABNORMAL LOW (ref 12.0–15.0)
RBC: 3.36 MIL/uL — ABNORMAL LOW (ref 3.87–5.11)

## 2013-11-01 LAB — FERRITIN: Ferritin: 15 ng/mL (ref 10–291)

## 2013-11-01 NOTE — Progress Notes (Signed)
Melody Mitchell presented for [employer]. Labs per MD order drawn via Peripheral Line 23 gauge needle inserted in LT AC  Good blood return present. Procedure without incident.  Needle removed intact. Patient tolerated procedure well.

## 2013-11-03 NOTE — Progress Notes (Signed)
[name],[name], PA-C [address] [place] [address]  ANEMIA, IRON DEFICIENCY - Plan: CBC, Ferritin  ARTERIOVENOUS MALFORMATION  CURRENT THERAPY: IV [name] last given on 05/06/2013  INTERVAL HISTORY: Melody Mitchell 72 y.o. 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  Additionally, she is on PPI therapy for GERD which also may be a contributing factor.  I personally reviewed and went over laboratory results with the patient.  Labs most recently on 12/19 demonstrate a normocytic anemia with a Hgb of 9.2 g/dL, but otherwise normal CBC.  Ferritin is noted to be down to 15.  As a result, IV Ferehame 1020 mg has been ordered and signed and held for nurses to release.   Melody Mitchell reported to the ED on 11/6 with weakness and headaches.  A CT of head was performed and this was negative for any acute findings.  I personally reviewed and went over radiographic studies with the patient.  I have reviewed the patient's IV [name] requirement over the past 2 years.   Oncology Flowsheet 06/27/2011 03/13/2012 04/04/2012 10/31/2012  ferumoxytol ([name]) IV 1,020 mg 1,020 mg  1,020 mg   Oncology Flowsheet 05/06/2013  ferumoxytol [facility]) IV 1,020 mg   She has been needing IV [name] every 6 months since 06/27/2011.  With this information, I will develop a supportive therapy plan to allow for IV [name] every 6 months with labs the day of IV [name] and in between infusions to verify that we are maintaining iron needs.  If her ferritin is noted to rise above normal limits, we will hold [name] and monitor iron studies. However, over the past 2 years, she has not demonstrated iron studies above the normal limits.   Patient education regarding iron deficiency anemia is provided.  Iron deficiency anemia is the most common anemia.  Besides playing a critical role as an oxygen carrier  in the heme group of hemoglobin, iron is found in many key proteins in the cells, such as cytochromes and myoglobin, so it is not unexpected that a lack of iron has effects other than anemia.  Three studies have focused on nonanemic iron deficiency leading to fatigue.  Two studies showed that oral iron supplementation reduces fatigue, with no significant change in hemoglobin levels, in women with a ferritin level of less than 50 ng/mL, and a third study showed a lessening of fatigue with parental iron administration in women with a ferritin level of 15 ng/mL or less or an iron saturation of 20% or less.   Owing to obligate iron loss through menses, women are at greater risk for iron deficiency than men.  Iron loss in all women averages 1-3 ng per day, and dietary intake is often inadequate to maintain a positive iron balance.  A 1967 study showed that 25% of healthy, college-age women had no bone marrow iron stores and that another 33% had low stores.  Pregnancy adds to demands for iron, with requirements increasing to 6 ng per day by the end of pregnancy.  Athletes are another group at risk for iron deficiency.  Gastrointestinal tract blood is the source of iron loss, and exercise-induced hemolysis leads to urinary iron losses.  Decreased absorption of iron has also been implicated as a cause of iron deficiency, because of levels of hepcidin are often elevated in athletes owing to training-induced inflammation.    Obesity and  its surgical treatment are also at risk factors for iron deficiency.  Obese patients are often iron-deficient, with increased hepcidin level being implicated in decreased absorption.  After bariatric surgery, the incidence of iron deficiency can be as high as 50%.  Because the main site of iron absorption is the duodenum, surgeries that involve bypassing this part of the bowel are associated with an increased incidence of iron deficiency.  However, iron deficiency is seen as a sequela of  most types of bariatric surgery.    -NEJM Volume 371, No 14, pg [id number]  Melody Mitchell reports that she is more fatigued today than she has been in the past and this is likely from her iron deficiency.  Her Hgb has dropped some as a result of low iron.    Additionally, she notes that her joints are bothering her more tdoay.  I suspect this is secondary to arthritis and an acute exacerbation secondary to the weather today (precipitation) and/or secondary to iron deficiency.  Hematologically, she denies any complaints and ROS questioning is negative.  Past Medical History  Diagnosis Date  . High cholesterol   . Acid reflux   . Hx: recurrent pneumonia   . Anemia   . History of contact dermatitis   . Arthritis   . Bronchitis january 2014  . Hypertension   . Vertigo   . Hiatal hernia   . Reflux     has ANEMIA, IRON DEFICIENCY; HEMORRHOIDS; ARTERIOVENOUS MALFORMATION; GERD; High cholesterol; and Acid reflux on her problem list.     is allergic to sulfonamide derivatives and latex.  Melody Mitchell does not currently have medications on file.  Past Surgical History  Procedure Laterality Date  . Abdominal hysterectomy    . Appendectomy    . Cholecystectomy    . Bilateral breast biopsies  benign    . Colonoscopy w/ biopsies      Denies any headaches, dizziness, double vision, fevers, chills, night sweats, nausea, vomiting, diarrhea, constipation, chest pain, heart palpitations, shortness of breath, blood in stool, black tarry stool, urinary pain, urinary burning, urinary frequency, hematuria.   PHYSICAL EXAMINATION  ECOG PERFORMANCE STATUS: 1 - Symptomatic but completely ambulatory  Filed Vitals:   11/04/13 0905  BP: 112/79  Pulse: 96  Temp: 98.7 F (37.1 C)  Resp: 16    GENERAL:alert, no distress, well nourished, well developed, comfortable, cooperative and smiling SKIN: skin color, texture, turgor are normal, no rashes or significant lesions HEAD: Normocephalic, No masses,  lesions, tenderness or abnormalities EYES: normal, PERRLA, EOMI, Conjunctiva are pink and non-injected EARS: External ears normal OROPHARYNX:mucous membranes are moist  NECK: supple, trachea midline LYMPH:  not examined BREAST:not examined LUNGS: clear to auscultation  HEART: regular rate & rhythm, no murmurs and no gallops ABDOMEN:normal bowel sounds BACK: Back symmetric, no curvature. EXTREMITIES:less then 2 second capillary refill, no joint deformities, effusion, or inflammation, no clubbing, no cyanosis  NEURO: alert & oriented x 3 with fluent speech, no focal motor/sensory deficits, gait normal    LABORATORY DATA: CBC    Component Value Date/Time   WBC 4.8 11/01/2013 0843   RBC 3.36* 11/01/2013 0843   RBC 3.22* 05/09/2009 0743   HGB 9.2* 11/01/2013 0843   HCT 30.2* 11/01/2013 0843   PLT 361 11/01/2013 0843   MCV 89.9 11/01/2013 0843   MCH 27.4 11/01/2013 0843   MCHC 30.5 11/01/2013 0843   RDW 14.8 11/01/2013 0843   LYMPHSABS 1.5 09/19/2013 1330   MONOABS 0.3 09/19/2013 1330   EOSABS 0.1  09/19/2013 1330   BASOSABS 0.0 09/19/2013 1330    Lab Results  Component Value Date   FERRITIN 15 11/01/2013     RADIOGRAPHIC STUDIES:  09/19/2013  CLINICAL DATA: History of weakness and light-headedness. History of  constant nausea and headaches. Frontal headaches. Mild sinus  pressure. History of hypertension.  EXAM:  CT HEAD WITHOUT CONTRAST  TECHNIQUE:  Contiguous axial images were obtained from the base of the skull  through the vertex without intravenous contrast.  COMPARISON: 05/30/2012.  FINDINGS:  There is no evidence of brain mass, brain hemorrhage, or acute  infarction.  The ventricular system is normal size and shape. There is no  evidence of shift of midline structures, parenchymal lesion, or  subdural or epidural hematoma.  The calvarium is intact. Right mastoid air cells are well aerated.  Some of the left mastoid air cells are well aerated. There is some a    air cells which have a hazy appearance which is unchanged. No septal  destruction is evident. No sinusitis is evident.  IMPRESSION:  There is no evidence of brain mass, brain hemorrhage, or acute  infarction.  No acute or active brain process is seen. No skull lesion is  evident. No sinusitis is evident. Slight chronic haziness of some of  the left mastoid air cells. No septal destruction is evident.  Electronically Signed  By: Onalee Hua Call M.D.  On: 09/19/2013 14:32     ASSESSMENT:  1. 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  Additionally, she is on PPI therapy for GERD which also may be a contributing factor. 2. AVMs causing #1 3. GERD, on Nexium, possibly participating in #1 as well.  Patient Active Problem List   Diagnosis Date Noted  . High cholesterol   . Acid reflux   . GERD 01/03/2011  . HEMORRHOIDS 04/15/2010  . ANEMIA, IRON DEFICIENCY 01/12/2010  . ARTERIOVENOUS MALFORMATION 07/01/2009     PLAN:  1. I personally reviewed and went over laboratory results with the patient. 2. I personally reviewed and went over radiographic studies with the patient. 3. Patient education regarding IDA 4. Development of supportive therapy plan: Will order 1020 mg of IV [name] every 6 months per her pattern of requirement since 2012. 5. Labs every 3 months: CBC, ferritin 6. IV [name] 1020 mg every 6 months 7. D/C IV Ferehame order placed on 12/19.  Use supportive therapy plan orders. 8. Return in 6 months for follow-up.   THERAPY PLAN:  After reviewing Keysha's IV [name] requirement since 2012, she has needed the infusion every 6 months on average.  As a result, I have developed a supportive therapy plan reflecting this with standing orders for labs every 3 months and IV [name] every 6 months with treatment parameters.  Hopefully, this plan will maintain her iron requirements.  We will follow with labs every  3 months.  If this plan is effective for her, we may be able to decrease her appointment frequency with [place] over the next 1-2 years.  All questions were answered. The patient knows to call the clinic with any problems, questions or concerns. We can certainly see the patient much sooner if necessary.  Patient and plan discussed with Dr. Alla German and he is in agreement with the aforementioned.   KEFALAS,THOMAS

## 2013-11-04 ENCOUNTER — Encounter (HOSPITAL_BASED_OUTPATIENT_CLINIC_OR_DEPARTMENT_OTHER): Payer: Medicare Other | Admitting: Oncology

## 2013-11-04 ENCOUNTER — Encounter (HOSPITAL_BASED_OUTPATIENT_CLINIC_OR_DEPARTMENT_OTHER): Payer: Medicare Other

## 2013-11-04 ENCOUNTER — Encounter (HOSPITAL_COMMUNITY): Payer: Self-pay | Admitting: Oncology

## 2013-11-04 VITALS — BP 112/79 | HR 96 | Temp 98.7°F | Resp 16 | Wt 149.9 lb

## 2013-11-04 DIAGNOSIS — D509 Iron deficiency anemia, unspecified: Secondary | ICD-10-CM

## 2013-11-04 DIAGNOSIS — K219 Gastro-esophageal reflux disease without esophagitis: Secondary | ICD-10-CM

## 2013-11-04 DIAGNOSIS — Q279 Congenital malformation of peripheral vascular system, unspecified: Secondary | ICD-10-CM

## 2013-11-04 MED ORDER — SODIUM CHLORIDE 0.9 % IV SOLN
Freq: Once | INTRAVENOUS | Status: AC
  Administered 2013-11-04: 10:00:00 via INTRAVENOUS

## 2013-11-04 MED ORDER — SODIUM CHLORIDE 0.9 % IV SOLN
1020.0000 mg | Freq: Once | INTRAVENOUS | Status: AC
  Administered 2013-11-04: 1020 mg via INTRAVENOUS
  Filled 2013-11-04: qty 34

## 2013-11-04 NOTE — Patient Instructions (Signed)
.  [facility] Discharge Instructions  RECOMMENDATIONS MADE BY THE CONSULTANT AND ANY TEST RESULTS WILL BE SENT TO YOUR REFERRING PHYSICIAN.  EXAM FINDINGS BY THE PHYSICIAN TODAY AND SIGNS OR SYMPTOMS TO REPORT TO CLINIC OR PRIMARY PHYSICIAN: Exam and findings as discussed by Jenita Seashore PA.   INSTRUCTIONS/FOLLOW-UP: Labs every 3 months Iron every 6 months and see the Dr. In 6 months  Thank you for choosing [facility] to provide your oncology and hematology care.  To afford each patient quality time with our providers, please arrive at least 15 minutes before your scheduled appointment time.  With your help, our goal is to use those 15 minutes to complete the necessary work-up to ensure our physicians have the information they need to help with your evaluation and healthcare recommendations.    Effective January 1st, 2014, we ask that you re-schedule your appointment with our physicians should you arrive 10 or more minutes late for your appointment.  We strive to give you quality time with our providers, and arriving late affects you and other patients whose appointments are after yours.    Again, thank you for choosing [facility].  Our hope is that these requests will decrease the amount of time that you wait before being seen by our physicians.       _____________________________________________________________  Should you have questions after your visit to [facility], please contact our office at [phone] between the hours of 8:30 a.m. and 5:00 p.m.  Voicemails left after 4:30 p.m. will not be returned until the following business day.  For prescription refill requests, have your pharmacy contact our office with your prescription refill request.

## 2013-11-04 NOTE — Progress Notes (Signed)
Tolerated fereheme infusion well. 

## 2013-11-15 NOTE — Progress Notes (Signed)
REVIEWED.  Multiple jejunal/duodenal AVMs s/p APC at [facility] by Dr Arsenio Loader 2011.

## 2014-01-28 ENCOUNTER — Other Ambulatory Visit (HOSPITAL_COMMUNITY): Payer: Self-pay | Admitting: Family Medicine

## 2014-01-28 DIAGNOSIS — Z1231 Encounter for screening mammogram for malignant neoplasm of breast: Secondary | ICD-10-CM

## 2014-02-03 ENCOUNTER — Other Ambulatory Visit (HOSPITAL_COMMUNITY): Payer: Self-pay | Admitting: Oncology

## 2014-02-03 ENCOUNTER — Encounter (HOSPITAL_COMMUNITY): Payer: Self-pay

## 2014-02-03 ENCOUNTER — Encounter (HOSPITAL_COMMUNITY): Payer: Medicare HMO | Attending: Hematology and Oncology

## 2014-02-03 ENCOUNTER — Ambulatory Visit (HOSPITAL_COMMUNITY)
Admission: RE | Admit: 2014-02-03 | Discharge: 2014-02-03 | Disposition: A | Discharge status: Home / Self Care | Payer: Medicare HMO | Source: Ambulatory Visit | Attending: Family Medicine | Admitting: Family Medicine

## 2014-02-03 DIAGNOSIS — D509 Iron deficiency anemia, unspecified: Secondary | ICD-10-CM | POA: Insufficient documentation

## 2014-02-03 DIAGNOSIS — Z1231 Encounter for screening mammogram for malignant neoplasm of breast: Secondary | ICD-10-CM | POA: Insufficient documentation

## 2014-02-03 DIAGNOSIS — Q279 Congenital malformation of peripheral vascular system, unspecified: Secondary | ICD-10-CM

## 2014-02-03 LAB — CBC
HEMATOCRIT: 36.9 % (ref 36.0–46.0)
Hemoglobin: 11.6 g/dL — ABNORMAL LOW (ref 12.0–15.0)
MCH: 28.3 pg (ref 26.0–34.0)
MCHC: 31.4 g/dL (ref 30.0–36.0)
MCV: 90 fL (ref 78.0–100.0)
Platelets: 336 10*3/uL (ref 150–400)
RBC: 4.1 MIL/uL (ref 3.87–5.11)
RDW: 15.1 % (ref 11.5–15.5)
WBC: 4 10*3/uL (ref 4.0–10.5)

## 2014-02-03 LAB — FERRITIN: Ferritin: 29 ng/mL (ref 10–291)

## 2014-02-03 NOTE — Progress Notes (Signed)
Labs drawn today for cbc,ferr 

## 2014-02-07 ENCOUNTER — Encounter (HOSPITAL_BASED_OUTPATIENT_CLINIC_OR_DEPARTMENT_OTHER): Payer: Medicare HMO

## 2014-02-07 VITALS — BP 139/91 | HR 88 | Temp 97.0°F | Resp 20 | Wt 152.6 lb

## 2014-02-07 DIAGNOSIS — K219 Gastro-esophageal reflux disease without esophagitis: Secondary | ICD-10-CM

## 2014-02-07 DIAGNOSIS — D509 Iron deficiency anemia, unspecified: Secondary | ICD-10-CM

## 2014-02-07 DIAGNOSIS — Q279 Congenital malformation of peripheral vascular system, unspecified: Secondary | ICD-10-CM

## 2014-02-07 MED ORDER — SODIUM CHLORIDE 0.9 % IV SOLN
Freq: Once | INTRAVENOUS | Status: AC
  Administered 2014-02-07: 10:00:00 via INTRAVENOUS

## 2014-02-07 MED ORDER — SODIUM CHLORIDE 0.9 % IV SOLN
1020.0000 mg | Freq: Once | INTRAVENOUS | Status: AC
  Administered 2014-02-07: 1020 mg via INTRAVENOUS
  Filled 2014-02-07: qty 34

## 2014-02-07 MED ORDER — SODIUM CHLORIDE 0.9 % IJ SOLN
10.0000 mL | INTRAMUSCULAR | Status: DC | PRN
  Administered 2014-02-07: 10 mL via INTRAVENOUS

## 2014-04-23 ENCOUNTER — Emergency Department (HOSPITAL_COMMUNITY): Payer: Medicare HMO

## 2014-04-23 ENCOUNTER — Emergency Department (HOSPITAL_COMMUNITY)
Admission: EM | Admit: 2014-04-23 | Discharge: 2014-04-23 | Disposition: A | Discharge status: Home / Self Care | Payer: Medicare HMO | Attending: Emergency Medicine | Admitting: Emergency Medicine

## 2014-04-23 ENCOUNTER — Encounter (HOSPITAL_COMMUNITY): Payer: Self-pay | Admitting: Emergency Medicine

## 2014-04-23 DIAGNOSIS — R51 Headache: Secondary | ICD-10-CM | POA: Insufficient documentation

## 2014-04-23 DIAGNOSIS — Z791 Long term (current) use of non-steroidal anti-inflammatories (NSAID): Secondary | ICD-10-CM | POA: Insufficient documentation

## 2014-04-23 DIAGNOSIS — R519 Headache, unspecified: Secondary | ICD-10-CM

## 2014-04-23 DIAGNOSIS — D649 Anemia, unspecified: Secondary | ICD-10-CM | POA: Insufficient documentation

## 2014-04-23 DIAGNOSIS — K219 Gastro-esophageal reflux disease without esophagitis: Secondary | ICD-10-CM | POA: Insufficient documentation

## 2014-04-23 DIAGNOSIS — Z87891 Personal history of nicotine dependence: Secondary | ICD-10-CM | POA: Insufficient documentation

## 2014-04-23 DIAGNOSIS — Z8709 Personal history of other diseases of the respiratory system: Secondary | ICD-10-CM | POA: Insufficient documentation

## 2014-04-23 DIAGNOSIS — Z79899 Other long term (current) drug therapy: Secondary | ICD-10-CM | POA: Insufficient documentation

## 2014-04-23 DIAGNOSIS — R110 Nausea: Secondary | ICD-10-CM | POA: Insufficient documentation

## 2014-04-23 DIAGNOSIS — Z8639 Personal history of other endocrine, nutritional and metabolic disease: Secondary | ICD-10-CM | POA: Insufficient documentation

## 2014-04-23 DIAGNOSIS — R42 Dizziness and giddiness: Secondary | ICD-10-CM | POA: Insufficient documentation

## 2014-04-23 DIAGNOSIS — I10 Essential (primary) hypertension: Secondary | ICD-10-CM | POA: Insufficient documentation

## 2014-04-23 DIAGNOSIS — Z8739 Personal history of other diseases of the musculoskeletal system and connective tissue: Secondary | ICD-10-CM | POA: Insufficient documentation

## 2014-04-23 DIAGNOSIS — K5900 Constipation, unspecified: Secondary | ICD-10-CM | POA: Insufficient documentation

## 2014-04-23 DIAGNOSIS — Z91040 Latex allergy status: Secondary | ICD-10-CM | POA: Insufficient documentation

## 2014-04-23 DIAGNOSIS — Z862 Personal history of diseases of the blood and blood-forming organs and certain disorders involving the immune mechanism: Secondary | ICD-10-CM | POA: Insufficient documentation

## 2014-04-23 DIAGNOSIS — IMO0002 Reserved for concepts with insufficient information to code with codable children: Secondary | ICD-10-CM | POA: Insufficient documentation

## 2014-04-23 DIAGNOSIS — Z8701 Personal history of pneumonia (recurrent): Secondary | ICD-10-CM | POA: Insufficient documentation

## 2014-04-23 DIAGNOSIS — Z872 Personal history of diseases of the skin and subcutaneous tissue: Secondary | ICD-10-CM | POA: Insufficient documentation

## 2014-04-23 LAB — COMPREHENSIVE METABOLIC PANEL
ALK PHOS: 115 U/L (ref 39–117)
ALT: 22 U/L (ref 0–35)
AST: 26 U/L (ref 0–37)
Albumin: 3.6 g/dL (ref 3.5–5.2)
BILIRUBIN TOTAL: 0.2 mg/dL — AB (ref 0.3–1.2)
BUN: 11 mg/dL (ref 6–23)
CALCIUM: 9.3 mg/dL (ref 8.4–10.5)
CO2: 26 mEq/L (ref 19–32)
Chloride: 101 mEq/L (ref 96–112)
Creatinine, Ser: 0.77 mg/dL (ref 0.50–1.10)
GFR, EST NON AFRICAN AMERICAN: 80 mL/min — AB (ref >90)
GLUCOSE: 91 mg/dL (ref 70–99)
POTASSIUM: 3.6 meq/L — AB (ref 3.7–5.3)
Sodium: 141 mEq/L (ref 137–147)
Total Protein: 7.7 g/dL (ref 6.0–8.3)

## 2014-04-23 LAB — CBC WITH DIFFERENTIAL/PLATELET
BASOS ABS: 0 10*3/uL (ref 0.0–0.1)
BASOS PCT: 0 % (ref 0–1)
EOS ABS: 0.1 10*3/uL (ref 0.0–0.7)
Eosinophils Relative: 2 % (ref 0–5)
HCT: 34.6 % — ABNORMAL LOW (ref 36.0–46.0)
Hemoglobin: 11.3 g/dL — ABNORMAL LOW (ref 12.0–15.0)
Lymphocytes Relative: 15 % (ref 12–46)
Lymphs Abs: 0.9 10*3/uL (ref 0.7–4.0)
MCH: 29.2 pg (ref 26.0–34.0)
MCHC: 32.7 g/dL (ref 30.0–36.0)
MCV: 89.4 fL (ref 78.0–100.0)
Monocytes Absolute: 0.3 10*3/uL (ref 0.1–1.0)
Monocytes Relative: 4 % (ref 3–12)
NEUTROS PCT: 79 % — AB (ref 43–77)
Neutro Abs: 5 10*3/uL (ref 1.7–7.7)
Platelets: 324 10*3/uL (ref 150–400)
RBC: 3.87 MIL/uL (ref 3.87–5.11)
RDW: 16.3 % — AB (ref 11.5–15.5)
WBC: 6.4 10*3/uL (ref 4.0–10.5)

## 2014-04-23 MED ORDER — ONDANSETRON 4 MG PO TBDP
4.0000 mg | ORAL_TABLET | Freq: Three times a day (TID) | ORAL | Status: DC | PRN
Start: 2014-04-23 — End: 2014-07-28

## 2014-04-23 MED ORDER — HYDROCODONE-ACETAMINOPHEN 5-325 MG PO TABS: 1.0000 | ORAL_TABLET | Freq: Four times a day (QID) | ORAL | Status: DC | PRN

## 2014-04-23 MED ORDER — SODIUM CHLORIDE 0.9 % IV BOLUS (SEPSIS)
250.0000 mL | Freq: Once | INTRAVENOUS | Status: AC
  Administered 2014-04-23: 250 mL via INTRAVENOUS

## 2014-04-23 MED ORDER — SODIUM CHLORIDE 0.9 % IV SOLN
INTRAVENOUS | Status: DC
  Administered 2014-04-23: 08:00:00 via INTRAVENOUS

## 2014-04-23 MED ORDER — ONDANSETRON HCL 4 MG/2ML IJ SOLN
4.0000 mg | Freq: Once | INTRAMUSCULAR | Status: AC
  Administered 2014-04-23: 4 mg via INTRAVENOUS
  Filled 2014-04-23: qty 2

## 2014-04-23 MED ORDER — MORPHINE SULFATE 2 MG/ML IJ SOLN
1.0000 mg | Freq: Once | INTRAMUSCULAR | Status: AC
  Administered 2014-04-23: 1 mg via INTRAVENOUS
  Filled 2014-04-23: qty 1

## 2014-04-23 NOTE — ED Notes (Signed)
Pt states headache started at 2100 last night, also co nausea, denies emesis. Pt called EMS for transport. No apparent neurological deficits on triage.

## 2014-04-23 NOTE — Discharge Instructions (Signed)
Return for any newer worse symptoms. Take pain medicine as directed take antinausea medicine as needed. If headache is not resolved in one week and will need an MRI followup with your regular Dr.

## 2014-04-23 NOTE — ED Provider Notes (Signed)
CSN: [id number]     Arrival date & time 04/23/14  0727 History  This chart was scribed for Fredia Sorrow, MD by Jeanell Sparrow, ED Scribe. This patient was seen in room APA19/APA19 and the patient's care was started at 7:45 AM.  Chief Complaint  Patient presents with  . Headache    Patient is a 73 y.o. female presenting with headaches. The history is provided by the patient. No language interpreter was used.  Headache Pain location:  Generalized Quality:  Dull Radiates to:  Does not radiate Duration:  9 hours Timing:  Constant Progression:  Unchanged Chronicity:  New Relieved by:  Nothing Worsened by:  Nothing tried Ineffective treatments:  Acetaminophen Associated symptoms: dizziness and nausea   Associated symptoms: no abdominal pain, no back pain, no cough, no diarrhea, no fever, no neck pain, no sinus pressure and no vomiting    HPI Comments: Melody Mitchell is a 73 y.o. female who presents to the Emergency Department complaining of a severe, constant headache in her forehead and face that started about 8 hours ago. She also states that the pain radiates to the sides of her head. She describes the pain as aching. She reports having associated nausea. She took ibuprofen with no relief. She denies any recent injuries. She denies any cough, emesis, or fever.   Past Medical History  Diagnosis Date  . High cholesterol   . Acid reflux   . Hx: recurrent pneumonia   . Anemia   . History of contact dermatitis   . Arthritis   . Bronchitis january 2014  . Hypertension   . Vertigo   . Hiatal hernia   . Reflux    Past Surgical History  Procedure Laterality Date  . Abdominal hysterectomy    . Appendectomy    . Cholecystectomy    . Bilateral breast biopsies  benign    . Colonoscopy w/ biopsies     Family History  Problem Relation Age of Onset  . Heart failure Mother   . Diabetes Sister    History  Substance Use Topics  . Smoking status: Former Research scientist (life sciences)  . Smokeless tobacco:  Never Used  . Alcohol Use: No   OB History   Grav Para Term Preterm Abortions TAB SAB Ect Mult Living                 Review of Systems  Constitutional: Negative for fever.  HENT: Negative for rhinorrhea and sinus pressure.   Eyes: Negative for visual disturbance.  Respiratory: Negative for cough and shortness of breath.   Cardiovascular: Negative for chest pain.  Gastrointestinal: Positive for nausea and constipation. Negative for vomiting, abdominal pain and diarrhea.  Genitourinary: Negative for dysuria.  Musculoskeletal: Negative for back pain, joint swelling and neck pain.  Skin: Negative for rash.  Neurological: Positive for dizziness and headaches.  Psychiatric/Behavioral: Negative for confusion.  All other systems reviewed and are negative.   Allergies  Sulfonamide derivatives and Latex  Home Medications   Prior to Admission medications   Medication Sig Start Date End Date Taking? Authorizing Provider  amitriptyline (ELAVIL) 25 MG tablet Take 25 mg by mouth at bedtime as needed for sleep.    Yes Historical Provider, MD  celecoxib (CELEBREX) 100 MG capsule Take 100 mg by mouth daily.   Yes Historical Provider, MD  Cetirizine HCl 10 MG CAPS Take 1 capsule by mouth daily.   Yes Historical Provider, MD  esomeprazole (NEXIUM) 40 MG capsule Take 40 mg by mouth  daily before breakfast.     Yes Historical Provider, MD  fish oil-omega-3 fatty acids 1000 MG capsule Take 1 g by mouth daily.    Yes Historical Provider, MD  fluticasone (FLONASE) 50 MCG/ACT nasal spray Place 2 sprays into both nostrils daily.  04/11/13  Yes Historical Provider, MD  folic acid (FOLVITE) 1 MG tablet Take 1 mg by mouth daily.     Yes Historical Provider, MD  methylcellulose (ARTIFICIAL TEARS) 1 % ophthalmic solution Place 1 drop into both eyes 2 (two) times daily as needed (dry eyes).   Yes Historical Provider, MD  Multiple Vitamin (MULTIVITAMIN) capsule Take 1 capsule by mouth daily.     Yes Historical  Provider, MD  potassium chloride SA (K-DUR,KLOR-CON) 20 MEQ tablet Take 20 mEq by mouth daily.   Yes Historical Provider, MD  traMADol (ULTRAM) 50 MG tablet Take 50 mg by mouth every 6 (six) hours as needed. For pain   Yes Historical Provider, MD  docusate sodium (COLACE) 100 MG capsule Take 100 mg by mouth daily as needed for mild constipation or moderate constipation.     Historical Provider, MD  HYDROcodone-acetaminophen (NORCO/VICODIN) 5-325 MG per tablet Take 1 tablet by mouth every 6 (six) hours as needed for moderate pain. 04/23/14   Fredia Sorrow, MD  meclizine (ANTIVERT) 25 MG tablet Take 25 mg by mouth 3 (three) times daily as needed for dizziness.     Historical Provider, MD  ondansetron (ZOFRAN ODT) 4 MG disintegrating tablet Take 1 tablet (4 mg total) by mouth every 8 (eight) hours as needed. 04/23/14   Fredia Sorrow, MD   BP 130/87  Pulse 68  Temp(Src) 98.2 F (36.8 C) (Oral) Physical Exam  Nursing note and vitals reviewed. Constitutional: She is oriented to person, place, and time. She appears well-developed and well-nourished. No distress.  HENT:  Head: Normocephalic and atraumatic.  Eyes: EOM are normal.  Neck: Neck supple. No tracheal deviation present.  Cardiovascular: Normal rate and regular rhythm.   Pulmonary/Chest: Effort normal. No respiratory distress.  Abdominal: Bowel sounds are normal. There is no tenderness.  Musculoskeletal: Normal range of motion. She exhibits no edema.  Neurological: She is alert and oriented to person, place, and time. No cranial nerve deficit. Coordination normal.  Skin: Skin is warm and dry.  Psychiatric: She has a normal mood and affect. Her behavior is normal.    ED Course  Procedures (including critical care time) DIAGNOSTIC STUDIES:     COORDINATION OF CARE: 7:50 AM- Pt advised of plan for treatment which includes medication, labs, and radiology and pt agrees.  Results for orders placed during the hospital encounter of  04/23/14  COMPREHENSIVE METABOLIC PANEL      Result Value Ref Range   Sodium 141  137 - 147 mEq/L   Potassium 3.6 (*) 3.7 - 5.3 mEq/L   Chloride 101  96 - 112 mEq/L   CO2 26  19 - 32 mEq/L   Glucose, Bld 91  70 - 99 mg/dL   BUN 11  6 - 23 mg/dL   Creatinine, Ser 0.77  0.50 - 1.10 mg/dL   Calcium 9.3  8.4 - 10.5 mg/dL   Total Protein 7.7  6.0 - 8.3 g/dL   Albumin 3.6  3.5 - 5.2 g/dL   AST 26  0 - 37 U/L   ALT 22  0 - 35 U/L   Alkaline Phosphatase 115  39 - 117 U/L   Total Bilirubin 0.2 (*) 0.3 - 1.2 mg/dL  GFR calc non Af Amer 80 (*) >90 mL/min   GFR calc Af Amer >90  >90 mL/min  CBC WITH DIFFERENTIAL      Result Value Ref Range   WBC 6.4  4.0 - 10.5 K/uL   RBC 3.87  3.87 - 5.11 MIL/uL   Hemoglobin 11.3 (*) 12.0 - 15.0 g/dL   HCT 34.6 (*) 36.0 - 46.0 %   MCV 89.4  78.0 - 100.0 fL   MCH 29.2  26.0 - 34.0 pg   MCHC 32.7  30.0 - 36.0 g/dL   RDW 16.3 (*) 11.5 - 15.5 %   Platelets 324  150 - 400 K/uL   Neutrophils Relative % 79 (*) 43 - 77 %   Neutro Abs 5.0  1.7 - 7.7 K/uL   Lymphocytes Relative 15  12 - 46 %   Lymphs Abs 0.9  0.7 - 4.0 K/uL   Monocytes Relative 4  3 - 12 %   Monocytes Absolute 0.3  0.1 - 1.0 K/uL   Eosinophils Relative 2  0 - 5 %   Eosinophils Absolute 0.1  0.0 - 0.7 K/uL   Basophils Relative 0  0 - 1 %   Basophils Absolute 0.0  0.0 - 0.1 K/uL   Ct Head Wo Contrast  04/23/2014   CLINICAL DATA:  Recent headaches  EXAM: CT HEAD WITHOUT CONTRAST  TECHNIQUE: Contiguous axial images were obtained from the base of the skull through the vertex without intravenous contrast.  COMPARISON:  09/19/2013  FINDINGS: The bony calvarium is intact. The ventricles are of normal size and configuration. No findings to suggest acute hemorrhage, acute infarction or space-occupying mass lesion are noted.  IMPRESSION: No acute intracranial abnormality is noted.   Electronically Signed   By: Inez Catalina M.D.   On: 04/23/2014 08:03    Medications  0.9 %  sodium chloride infusion (  Intravenous New Bag/Given 04/23/14 0820)  sodium chloride 0.9 % bolus 250 mL (0 mLs Intravenous Stopped 04/23/14 0820)  ondansetron (ZOFRAN) injection 4 mg (4 mg Intravenous Given 04/23/14 0808)  morphine 2 MG/ML injection 1 mg (1 mg Intravenous Given 04/23/14 0809)     Labs Review Labs Reviewed  COMPREHENSIVE METABOLIC PANEL - Abnormal; Notable for the following:    Potassium 3.6 (*)    Total Bilirubin 0.2 (*)    GFR calc non Af Amer 80 (*)    All other components within normal limits  CBC WITH DIFFERENTIAL - Abnormal; Notable for the following:    Hemoglobin 11.3 (*)    HCT 34.6 (*)    RDW 16.3 (*)    Neutrophils Relative % 79 (*)    All other components within normal limits   Results for orders placed during the hospital encounter of 04/23/14  COMPREHENSIVE METABOLIC PANEL      Result Value Ref Range   Sodium 141  137 - 147 mEq/L   Potassium 3.6 (*) 3.7 - 5.3 mEq/L   Chloride 101  96 - 112 mEq/L   CO2 26  19 - 32 mEq/L   Glucose, Bld 91  70 - 99 mg/dL   BUN 11  6 - 23 mg/dL   Creatinine, Ser 0.77  0.50 - 1.10 mg/dL   Calcium 9.3  8.4 - 10.5 mg/dL   Total Protein 7.7  6.0 - 8.3 g/dL   Albumin 3.6  3.5 - 5.2 g/dL   AST 26  0 - 37 U/L   ALT 22  0 - 35 U/L  Alkaline Phosphatase 115  39 - 117 U/L   Total Bilirubin 0.2 (*) 0.3 - 1.2 mg/dL   GFR calc non Af Amer 80 (*) >90 mL/min   GFR calc Af Amer >90  >90 mL/min  CBC WITH DIFFERENTIAL      Result Value Ref Range   WBC 6.4  4.0 - 10.5 K/uL   RBC 3.87  3.87 - 5.11 MIL/uL   Hemoglobin 11.3 (*) 12.0 - 15.0 g/dL   HCT 34.6 (*) 36.0 - 46.0 %   MCV 89.4  78.0 - 100.0 fL   MCH 29.2  26.0 - 34.0 pg   MCHC 32.7  30.0 - 36.0 g/dL   RDW 16.3 (*) 11.5 - 15.5 %   Platelets 324  150 - 400 K/uL   Neutrophils Relative % 79 (*) 43 - 77 %   Neutro Abs 5.0  1.7 - 7.7 K/uL   Lymphocytes Relative 15  12 - 46 %   Lymphs Abs 0.9  0.7 - 4.0 K/uL   Monocytes Relative 4  3 - 12 %   Monocytes Absolute 0.3  0.1 - 1.0 K/uL   Eosinophils  Relative 2  0 - 5 %   Eosinophils Absolute 0.1  0.0 - 0.7 K/uL   Basophils Relative 0  0 - 1 %   Basophils Absolute 0.0  0.0 - 0.1 K/uL     Imaging Review Ct Head Wo Contrast  04/23/2014   CLINICAL DATA:  Recent headaches  EXAM: CT HEAD WITHOUT CONTRAST  TECHNIQUE: Contiguous axial images were obtained from the base of the skull through the vertex without intravenous contrast.  COMPARISON:  09/19/2013  FINDINGS: The bony calvarium is intact. The ventricles are of normal size and configuration. No findings to suggest acute hemorrhage, acute infarction or space-occupying mass lesion are noted.  IMPRESSION: No acute intracranial abnormality is noted.   Electronically Signed   By: Inez Catalina M.D.   On: 04/23/2014 08:03     EKG Interpretation None      MDM   Final diagnoses:  Headache     Head CT negative. Patient's headache improved with medication here. No focal neuro deficits. CT without any evidence of head bleed or sinusitis. Will treat symptomatically with hydrocodone and Zofran. If headache persists patient will require MRI of the brain.   I personally performed the services described in this documentation, which was scribed in my presence. The recorded information has been reviewed and is accurate.      Fredia Sorrow, MD 04/23/14 [phone]

## 2014-05-01 ENCOUNTER — Encounter (HOSPITAL_COMMUNITY): Payer: Medicare HMO | Attending: Hematology and Oncology

## 2014-05-01 DIAGNOSIS — M25569 Pain in unspecified knee: Secondary | ICD-10-CM | POA: Insufficient documentation

## 2014-05-01 DIAGNOSIS — K649 Unspecified hemorrhoids: Secondary | ICD-10-CM | POA: Insufficient documentation

## 2014-05-01 DIAGNOSIS — K219 Gastro-esophageal reflux disease without esophagitis: Secondary | ICD-10-CM | POA: Insufficient documentation

## 2014-05-01 DIAGNOSIS — Q279 Congenital malformation of peripheral vascular system, unspecified: Secondary | ICD-10-CM | POA: Insufficient documentation

## 2014-05-01 DIAGNOSIS — Z9089 Acquired absence of other organs: Secondary | ICD-10-CM | POA: Insufficient documentation

## 2014-05-01 DIAGNOSIS — Z90710 Acquired absence of both cervix and uterus: Secondary | ICD-10-CM | POA: Insufficient documentation

## 2014-05-01 DIAGNOSIS — D509 Iron deficiency anemia, unspecified: Secondary | ICD-10-CM | POA: Insufficient documentation

## 2014-05-01 DIAGNOSIS — E785 Hyperlipidemia, unspecified: Secondary | ICD-10-CM | POA: Insufficient documentation

## 2014-05-01 DIAGNOSIS — Z79899 Other long term (current) drug therapy: Secondary | ICD-10-CM | POA: Insufficient documentation

## 2014-05-01 LAB — CBC
HCT: 35 % — ABNORMAL LOW (ref 36.0–46.0)
HEMOGLOBIN: 11.3 g/dL — AB (ref 12.0–15.0)
MCH: 29.3 pg (ref 26.0–34.0)
MCHC: 32.3 g/dL (ref 30.0–36.0)
MCV: 90.7 fL (ref 78.0–100.0)
PLATELETS: 365 10*3/uL (ref 150–400)
RBC: 3.86 MIL/uL — ABNORMAL LOW (ref 3.87–5.11)
RDW: 16.5 % — ABNORMAL HIGH (ref 11.5–15.5)
WBC: 4.9 10*3/uL (ref 4.0–10.5)

## 2014-05-01 LAB — FERRITIN: FERRITIN: 144 ng/mL (ref 10–291)

## 2014-05-01 NOTE — Progress Notes (Signed)
LABS DRAWN FOR CBC, FERR  

## 2014-05-02 NOTE — Progress Notes (Signed)
[name],[name], PA-C [address] [place] [address]  ANEMIA, IRON DEFICIENCY  ARTERIOVENOUS MALFORMATION  CURRENT THERAPY: IV [name] last given on 02/07/2014  INTERVAL HISTORY: Melody Mitchell 73 y.o. female returns for  regular  visit for followup of  iron deficiency anemia requiring IV [name] with failure of PO iron maintaining iron stores secondary to AVMs. Additionally, she is on PPI therapy for GERD which also may be a contributing factor.  I personally reviewed and went over laboratory results with the patient.  The results are noted within this dictation.  I personally reviewed and went over radiographic studies with the patient.  The results are noted within this dictation.  Abella went to the ED about 10 days ago with headaches that resolved with conservative treatment.  CT head is negative for any acute findings.  Additionally, Mammogram on 02/04/2014 was BIRADS 1.   I reviewed the patient's supportive therapy plan.  It is noted that she has required IV [name] 1020 mg every 6 months since 2012.  I learned recently that the 1020 mg dose has not been being paid well by insurance companies, therefore, I have changed her supportive therapy plan to 510 mg days 1 and 8 every 6 months and we will see how she dose.    She is scheduled for an iron infusion today, but her ferritin is 144.  Therefore, we will cancel [name] today and plan on administering [name] 510 mg days 1 and 8 in 3 months (September) with labs.   She was started on Celebrex for her knee pain and she reports that her discomfort is much improved.   Hematologically, she denies any complaints and ROS questioning is negative.   Past Medical History  Diagnosis Date  . High cholesterol   . Acid reflux   . Hx: recurrent pneumonia   . Anemia   . History of contact dermatitis   . Arthritis   . Bronchitis january 2014  . Hypertension   . Vertigo   . Hiatal hernia   . Reflux     has ANEMIA, IRON  DEFICIENCY; HEMORRHOIDS; ARTERIOVENOUS MALFORMATION; GERD; High cholesterol; and Acid reflux on her problem list.     is allergic to sulfonamide derivatives and latex.  Ms. Teem does not currently have medications on file.  Past Surgical History  Procedure Laterality Date  . Abdominal hysterectomy    . Appendectomy    . Cholecystectomy    . Bilateral breast biopsies  benign    . Colonoscopy w/ biopsies      Denies any headaches, dizziness, double vision, fevers, chills, night sweats, nausea, vomiting, diarrhea, constipation, chest pain, heart palpitations, shortness of breath, blood in stool, black tarry stool, urinary pain, urinary burning, urinary frequency, hematuria.   PHYSICAL EXAMINATION  ECOG PERFORMANCE STATUS: 0 - Asymptomatic  Filed Vitals:   05/05/14 0954  BP: 107/71  Pulse: 78  Temp: 98.8 F (37.1 C)  Resp: 18    GENERAL:alert, no distress, well nourished, well developed, comfortable, cooperative and smiling SKIN: skin color, texture, turgor are normal, no rashes or significant lesions HEAD: Normocephalic, No masses, lesions, tenderness or abnormalities EYES: normal, PERRLA, EOMI, Conjunctiva are pink and non-injected EARS: External ears normal OROPHARYNX:mucous membranes are moist  NECK: supple, trachea midline LYMPH:  not examined BREAST:not examined LUNGS: clear to auscultation  HEART: regular rate & rhythm, no murmurs and no gallops ABDOMEN:abdomen soft and normal bowel sounds BACK: Back symmetric, no curvature. EXTREMITIES:less then 2 second capillary  refill, no joint deformities, effusion, or inflammation, no skin discoloration, no clubbing, no cyanosis  NEURO: alert & oriented x 3 with fluent speech, no focal motor/sensory deficits, gait normal   LABORATORY DATA: CBC    Component Value Date/Time   WBC 4.9 05/01/2014 0939   RBC 3.86* 05/01/2014 0939   RBC 3.22* 05/09/2009 0743   HGB 11.3* 05/01/2014 0939   HCT 35.0* 05/01/2014 0939   PLT 365  05/01/2014 0939   MCV 90.7 05/01/2014 0939   MCH 29.3 05/01/2014 0939   MCHC 32.3 05/01/2014 0939   RDW 16.5* 05/01/2014 0939   LYMPHSABS 0.9 04/23/2014 0806   MONOABS 0.3 04/23/2014 0806   EOSABS 0.1 04/23/2014 0806   BASOSABS 0.0 04/23/2014 0806    Lab Results  Component Value Date   FERRITIN 144 05/01/2014      RADIOGRAPHIC STUDIES:  02/04/2014  CLINICAL DATA: Screening.  EXAM:  DIGITAL SCREENING BILATERAL MAMMOGRAM WITH CAD  COMPARISON: Previous exam(s).  ACR Breast Density Category b: There are scattered areas of  fibroglandular density.  FINDINGS:  There are no findings suspicious for malignancy. Images were  processed with CAD.  IMPRESSION:  No mammographic evidence of malignancy. A result letter of this  screening mammogram will be mailed directly to the patient.  RECOMMENDATION:  Screening mammogram in one year. (Code:[id number]-B-01Y)  BI-RADS CATEGORY 1: Negative.  Electronically Signed  By: Conchita Paris M.D.  On: 02/04/2014 11:05    ASSESSMENT:  1. 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 Additionally, she is on PPI therapy for GERD which also may be a contributing factor resulting in malabsorption.  2. AVMs causing #1  3. GERD, on Nexium, possibly participating in #1 as well.  Patient Active Problem List   Diagnosis Date Noted  . High cholesterol   . Acid reflux   . GERD 01/03/2011  . HEMORRHOIDS 04/15/2010  . ANEMIA, IRON DEFICIENCY 01/12/2010  . ARTERIOVENOUS MALFORMATION 07/01/2009     PLAN:  1. I personally reviewed and went over laboratory results with the patient.  The results are noted within this dictation. 2. I personally reviewed and went over radiographic studies with the patient.  The results are noted within this dictation.   3. Alteration and review of supportive therapy plan.  I changed [name] supportive therapy plan to 510 mg IV [name] day 1 and 8 every 6 months. 4.  Cancel IV [name] today due to ferritin being 144 on 6/18. 5. [name] 510 mg on days 1 and 8 in 3 months and then planned for every 6 months thereafter.  6. Labs every 3 months: CBC , ferritin 7. Return in 6 months for follow-up   THERAPY PLAN:  Due to insurance issues we have recently learned of regarding IV [name] 1020 mg, I will change her supportive therapy plan to 510 mg of IV [name] days 1 and 8 every 6 months.   All questions were answered. The patient knows to call the clinic with any problems, questions or concerns. We can certainly see the patient much sooner if necessary.  Patient and plan discussed with Dr. Farrel Gobble and he is in agreement with the aforementioned.   KEFALAS,THOMAS 05/05/2014

## 2014-05-05 ENCOUNTER — Encounter (HOSPITAL_BASED_OUTPATIENT_CLINIC_OR_DEPARTMENT_OTHER): Payer: Medicare HMO | Admitting: Oncology

## 2014-05-05 ENCOUNTER — Encounter (HOSPITAL_COMMUNITY): Payer: Medicare HMO

## 2014-05-05 ENCOUNTER — Encounter (HOSPITAL_COMMUNITY): Payer: Self-pay | Admitting: Oncology

## 2014-05-05 VITALS — BP 107/71 | HR 78 | Temp 98.8°F | Resp 18 | Wt 154.4 lb

## 2014-05-05 DIAGNOSIS — K219 Gastro-esophageal reflux disease without esophagitis: Secondary | ICD-10-CM

## 2014-05-05 DIAGNOSIS — Q279 Congenital malformation of peripheral vascular system, unspecified: Secondary | ICD-10-CM

## 2014-05-05 DIAGNOSIS — K909 Intestinal malabsorption, unspecified: Secondary | ICD-10-CM

## 2014-05-05 DIAGNOSIS — D509 Iron deficiency anemia, unspecified: Secondary | ICD-10-CM

## 2014-05-05 NOTE — Patient Instructions (Signed)
[facility] Discharge Instructions  RECOMMENDATIONS MADE BY THE CONSULTANT AND ANY TEST RESULTS WILL BE SENT TO YOUR REFERRING PHYSICIAN.  No Iron infusion today.  We will see you in September for labs and Iron infusion. You will see the doctor in December and have repeat lab work.   Thank you for choosing [facility] to provide your oncology and hematology care.  To afford each patient quality time with our providers, please arrive at least 15 minutes before your scheduled appointment time.  With your help, our goal is to use those 15 minutes to complete the necessary work-up to ensure our physicians have the information they need to help with your evaluation and healthcare recommendations.    Effective January 1st, 2014, we ask that you re-schedule your appointment with our physicians should you arrive 10 or more minutes late for your appointment.  We strive to give you quality time with our providers, and arriving late affects you and other patients whose appointments are after yours.    Again, thank you for choosing [facility].  Our hope is that these requests will decrease the amount of time that you wait before being seen by our physicians.       _____________________________________________________________  Should you have questions after your visit to [facility], please contact our office at [phone] between the hours of 8:30 a.m. and 5:00 p.m.  Voicemails left after 4:30 p.m. will not be returned until the following business day.  For prescription refill requests, have your pharmacy contact our office with your prescription refill request.

## 2014-07-28 ENCOUNTER — Emergency Department (HOSPITAL_COMMUNITY): Payer: Medicare HMO

## 2014-07-28 ENCOUNTER — Emergency Department (HOSPITAL_COMMUNITY)
Admission: EM | Admit: 2014-07-28 | Discharge: 2014-07-28 | Disposition: A | Discharge status: Home / Self Care | Payer: Medicare HMO | Attending: Emergency Medicine | Admitting: Emergency Medicine

## 2014-07-28 ENCOUNTER — Encounter (HOSPITAL_COMMUNITY): Payer: Self-pay | Admitting: Emergency Medicine

## 2014-07-28 DIAGNOSIS — Z8701 Personal history of pneumonia (recurrent): Secondary | ICD-10-CM | POA: Insufficient documentation

## 2014-07-28 DIAGNOSIS — R109 Unspecified abdominal pain: Secondary | ICD-10-CM | POA: Diagnosis not present

## 2014-07-28 DIAGNOSIS — Z8739 Personal history of other diseases of the musculoskeletal system and connective tissue: Secondary | ICD-10-CM | POA: Diagnosis not present

## 2014-07-28 DIAGNOSIS — Z9089 Acquired absence of other organs: Secondary | ICD-10-CM | POA: Insufficient documentation

## 2014-07-28 DIAGNOSIS — Z8709 Personal history of other diseases of the respiratory system: Secondary | ICD-10-CM | POA: Diagnosis not present

## 2014-07-28 DIAGNOSIS — I10 Essential (primary) hypertension: Secondary | ICD-10-CM | POA: Insufficient documentation

## 2014-07-28 DIAGNOSIS — IMO0002 Reserved for concepts with insufficient information to code with codable children: Secondary | ICD-10-CM | POA: Diagnosis not present

## 2014-07-28 DIAGNOSIS — Z87891 Personal history of nicotine dependence: Secondary | ICD-10-CM | POA: Insufficient documentation

## 2014-07-28 DIAGNOSIS — Z862 Personal history of diseases of the blood and blood-forming organs and certain disorders involving the immune mechanism: Secondary | ICD-10-CM | POA: Insufficient documentation

## 2014-07-28 DIAGNOSIS — D649 Anemia, unspecified: Secondary | ICD-10-CM

## 2014-07-28 DIAGNOSIS — Z79899 Other long term (current) drug therapy: Secondary | ICD-10-CM | POA: Diagnosis not present

## 2014-07-28 DIAGNOSIS — Z91040 Latex allergy status: Secondary | ICD-10-CM | POA: Diagnosis not present

## 2014-07-28 DIAGNOSIS — K219 Gastro-esophageal reflux disease without esophagitis: Secondary | ICD-10-CM | POA: Diagnosis not present

## 2014-07-28 DIAGNOSIS — Z90710 Acquired absence of both cervix and uterus: Secondary | ICD-10-CM | POA: Diagnosis not present

## 2014-07-28 DIAGNOSIS — Z9889 Other specified postprocedural states: Secondary | ICD-10-CM | POA: Insufficient documentation

## 2014-07-28 DIAGNOSIS — Z872 Personal history of diseases of the skin and subcutaneous tissue: Secondary | ICD-10-CM | POA: Diagnosis not present

## 2014-07-28 DIAGNOSIS — M542 Cervicalgia: Secondary | ICD-10-CM | POA: Diagnosis present

## 2014-07-28 DIAGNOSIS — Z8639 Personal history of other endocrine, nutritional and metabolic disease: Secondary | ICD-10-CM | POA: Insufficient documentation

## 2014-07-28 DIAGNOSIS — R42 Dizziness and giddiness: Secondary | ICD-10-CM | POA: Insufficient documentation

## 2014-07-28 LAB — URINALYSIS W MICROSCOPIC (NOT AT ARMC)
Bilirubin Urine: NEGATIVE
Glucose, UA: NEGATIVE mg/dL
Hgb urine dipstick: NEGATIVE
Ketones, ur: NEGATIVE mg/dL
NITRITE: NEGATIVE
PH: 5.5 (ref 5.0–8.0)
Protein, ur: NEGATIVE mg/dL
UROBILINOGEN UA: 0.2 mg/dL (ref 0.0–1.0)

## 2014-07-28 LAB — CBC WITH DIFFERENTIAL/PLATELET
Basophils Absolute: 0 10*3/uL (ref 0.0–0.1)
Basophils Relative: 0 % (ref 0–1)
Eosinophils Absolute: 0.1 10*3/uL (ref 0.0–0.7)
Eosinophils Relative: 2 % (ref 0–5)
HEMATOCRIT: 32.7 % — AB (ref 36.0–46.0)
Hemoglobin: 10.5 g/dL — ABNORMAL LOW (ref 12.0–15.0)
LYMPHS PCT: 21 % (ref 12–46)
Lymphs Abs: 1 10*3/uL (ref 0.7–4.0)
MCH: 28.4 pg (ref 26.0–34.0)
MCHC: 32.1 g/dL (ref 30.0–36.0)
MCV: 88.4 fL (ref 78.0–100.0)
MONO ABS: 0.4 10*3/uL (ref 0.1–1.0)
Monocytes Relative: 8 % (ref 3–12)
NEUTROS PCT: 69 % (ref 43–77)
Neutro Abs: 3.2 10*3/uL (ref 1.7–7.7)
Platelets: 337 10*3/uL (ref 150–400)
RBC: 3.7 MIL/uL — ABNORMAL LOW (ref 3.87–5.11)
RDW: 14.1 % (ref 11.5–15.5)
WBC: 4.7 10*3/uL (ref 4.0–10.5)

## 2014-07-28 LAB — BASIC METABOLIC PANEL
Anion gap: 9 (ref 5–15)
BUN: 8 mg/dL (ref 6–23)
CHLORIDE: 105 meq/L (ref 96–112)
CO2: 28 meq/L (ref 19–32)
CREATININE: 0.97 mg/dL (ref 0.50–1.10)
Calcium: 9.4 mg/dL (ref 8.4–10.5)
GFR calc non Af Amer: 56 mL/min — ABNORMAL LOW (ref >90)
GFR, EST AFRICAN AMERICAN: 65 mL/min — AB (ref >90)
Glucose, Bld: 70 mg/dL (ref 70–99)
Potassium: 3.9 mEq/L (ref 3.7–5.3)
Sodium: 142 mEq/L (ref 137–147)

## 2014-07-28 LAB — PROTIME-INR
INR: 1.02 (ref 0.00–1.49)
Prothrombin Time: 13.4 seconds (ref 11.6–15.2)

## 2014-07-28 MED ORDER — IOHEXOL 350 MG/ML SOLN
100.0000 mL | Freq: Once | INTRAVENOUS | Status: AC | PRN
  Administered 2014-07-28: 80 mL via INTRAVENOUS

## 2014-07-28 NOTE — Discharge Instructions (Signed)
No cause of your neck pain was identified.  Your hemoglobin is low and needs to be rechecked by your doctor in the next 1-2 weeks.

## 2014-07-28 NOTE — ED Notes (Signed)
Pt reports had a wave of dizziness Thursday morning and pain in left side of neck.  Pt says movement doesn't affect her neck pain, pain is sharp and intermittent.  Reports saw her doctor that afternoon and put her on meclizine.  Reports dizziness got better but the neck pain started back yesterday.  Pt also reports pain in r side of abd x 1 week.  Pt says pain is constant and feels like abd is swollen.  Has been taking anti gas medication.  Denies any n/v/d.  LBM was Friday.

## 2014-07-28 NOTE — ED Notes (Addendum)
Pt c/o neck pain on left side x 5 days.  Pain described as aching.  Pt denies any muscle strain or trauma. Pt was also experiencing dizziness, but was treated PCP with meclizine and pt states dizziness is better although not completely resolved.  Pt also reports right sided abd pain x 1 week, described as dull/aching, and more noticeable with movement.  Pt reports hx of constipation. LBM 3 days ago. pt states she usually has a BM at least every 2 days. NAD, respirations equal and unlabored, skin warm and dry.

## 2014-07-28 NOTE — ED Provider Notes (Signed)
CSN: [id number]     Arrival date & time 07/28/14  5643 History  This chart was scribed for Shaune Pollack, MD by Lowella Petties, ED Scribe. The patient was seen in room APA11/APA11. Patient's care was started at 10:18 AM.   Chief Complaint  Patient presents with  . Neck Pain  . Abdominal Pain   Patient is a 73 y.o. female presenting with neck pain and abdominal pain. The history is provided by the patient. No language interpreter was used.  Neck Pain Pain location:  L side Quality:  Shooting and stabbing Pain severity:  Moderate Pain is:  Same all the time Onset quality:  Sudden Duration:  5 days Timing:  Sporadic Chronicity:  New Relieved by:  Nothing Worsened by:  Nothing tried Ineffective treatments:  None tried Abdominal Pain  HPI Comments: Melody Mitchell is a 73 y.o. female who presents to the Emergency Department complaining of "twinges" in left side of her neck and up to her head onset 5 days ago. She reports that this happens at least 4 or 5 times per day. She reports associated lightheadedness. She reports that she takes Meclizine for vertigo, and that she ran out 4 days ago when her symptoms began. She denies injury to her neck, recent falls, and other pain. She reports congestion, constipation, and abdominal swelling. She reports a history of acid reflux. She denies one sided numbness or weakness. She denies fever, and pain with chewing. She reports a history of appendectomy, cholecystectomy, and hysterectomy. She quit smoking 6 years ago.  Past Medical History  Diagnosis Date  . High cholesterol   . Acid reflux   . Hx: recurrent pneumonia   . Anemia   . History of contact dermatitis   . Arthritis   . Bronchitis january 2014  . Hypertension   . Vertigo   . Hiatal hernia   . Reflux    Past Surgical History  Procedure Laterality Date  . Abdominal hysterectomy    . Appendectomy    . Cholecystectomy    . Bilateral breast biopsies  benign    . Colonoscopy w/  biopsies     Family History  Problem Relation Age of Onset  . Heart failure Mother   . Diabetes Sister    History  Substance Use Topics  . Smoking status: Former Research scientist (life sciences)  . Smokeless tobacco: Never Used  . Alcohol Use: No   OB History   Grav Para Term Preterm Abortions TAB SAB Ect Mult Living                 Review of Systems  Gastrointestinal: Positive for abdominal pain.  Musculoskeletal: Positive for neck pain.  All other systems reviewed and are negative.  Allergies  Sulfonamide derivatives and Latex  Home Medications   Prior to Admission medications   Medication Sig Start Date End Date Taking? Authorizing Provider  amitriptyline (ELAVIL) 25 MG tablet Take 25 mg by mouth at bedtime as needed for sleep.     Historical Provider, MD  celecoxib (CELEBREX) 100 MG capsule Take 100 mg by mouth daily.    Historical Provider, MD  Cetirizine HCl 10 MG CAPS Take 1 capsule by mouth daily.    Historical Provider, MD  docusate sodium (COLACE) 100 MG capsule Take 100 mg by mouth daily as needed for mild constipation or moderate constipation.     Historical Provider, MD  escitalopram (LEXAPRO) 10 MG tablet Take 10 mg by mouth daily. 10/25/13   Historical  Provider, MD  esomeprazole (NEXIUM) 40 MG capsule Take 40 mg by mouth daily before breakfast.      Historical Provider, MD  fish oil-omega-3 fatty acids 1000 MG capsule Take 1 g by mouth daily.     Historical Provider, MD  fluticasone (FLONASE) 50 MCG/ACT nasal spray Place 2 sprays into both nostrils daily.  04/11/13   Historical Provider, MD  folic acid (FOLVITE) 1 MG tablet Take 1 mg by mouth daily.      Historical Provider, MD  HYDROcodone-acetaminophen (NORCO/VICODIN) 5-325 MG per tablet Take 1 tablet by mouth every 6 (six) hours as needed for moderate pain. 04/23/14   Fredia Sorrow, MD  meclizine (ANTIVERT) 25 MG tablet Take 25 mg by mouth 3 (three) times daily as needed for dizziness.     Historical Provider, MD  methylcellulose  (ARTIFICIAL TEARS) 1 % ophthalmic solution Place 1 drop into both eyes 2 (two) times daily as needed (dry eyes).    Historical Provider, MD  Multiple Vitamin (MULTIVITAMIN) capsule Take 1 capsule by mouth daily.      Historical Provider, MD  ondansetron (ZOFRAN ODT) 4 MG disintegrating tablet Take 1 tablet (4 mg total) by mouth every 8 (eight) hours as needed. 04/23/14   Fredia Sorrow, MD  potassium chloride SA (K-DUR,KLOR-CON) 20 MEQ tablet Take 20 mEq by mouth daily.    Historical Provider, MD  traMADol (ULTRAM) 50 MG tablet Take 50 mg by mouth every 6 (six) hours as needed. For pain    Historical Provider, MD  triamcinolone ointment (KENALOG) 0.1 % Apply 1 application topically as needed. 02/26/14   Historical Provider, MD  zolpidem (AMBIEN) 10 MG tablet Take 10 mg by mouth at bedtime as needed. 02/26/14   Historical Provider, MD   Triage Vitals: BP 124/84  Pulse 97  Temp(Src) 98.4 F (36.9 C) (Oral)  Resp 20  Ht 5\' 4"  (1.626 m)  Wt 158 lb (71.668 kg)  BMI 27.11 kg/m2  SpO2 98% Physical Exam  Nursing note and vitals reviewed. Constitutional: She is oriented to person, place, and time. She appears well-developed and well-nourished. No distress.  HENT:  Head: Normocephalic and atraumatic.  Right Ear: External ear normal.  Left Ear: External ear normal.  Nose: Nose normal.  Dentures on the top and bottom.   Eyes: EOM are normal. Pupils are equal, round, and reactive to light.  Neck: Normal range of motion. Neck supple. No JVD present. No thyromegaly present.  No mastoid tenderness.Trachea is midline. No bruits. Full active ROM.   Pulmonary/Chest: Effort normal.  Musculoskeletal: Normal range of motion.  Neurological: She is alert and oriented to person, place, and time. She exhibits normal muscle tone. Coordination normal.  Skin: Skin is warm and dry.  Psychiatric: She has a normal mood and affect. Her behavior is normal. Thought content normal.    ED Course  Procedures (including  critical care time) DIAGNOSTIC STUDIES: Oxygen Saturation is 98% on room air, normal by my interpretation.    COORDINATION OF CARE: 10:28 AM-Discussed treatment plan which includes UA, EKG, CBC, and BMP with pt at bedside and pt agreed to plan.   Labs Review Labs Reviewed  CBC WITH DIFFERENTIAL - Abnormal; Notable for the following:    RBC 3.70 (*)    Hemoglobin 10.5 (*)    HCT 32.7 (*)    All other components within normal limits  BASIC METABOLIC PANEL - Abnormal; Notable for the following:    GFR calc non Af Amer 56 (*)  GFR calc Af Amer 65 (*)    All other components within normal limits  URINALYSIS W MICROSCOPIC - Abnormal; Notable for the following:    Specific Gravity, Urine <1.005 (*)    Leukocytes, UA TRACE (*)    Bacteria, UA FEW (*)    Squamous Epithelial / LPF FEW (*)    All other components within normal limits  PROTIME-INR    Imaging Review Ct Head Wo Contrast  07/28/2014   CLINICAL DATA:  Left sided neck pain.  EXAM: CT HEAD WITHOUT  CONTRAST  CT angiography of the neck  TECHNIQUE: Contiguous axial images were obtained from the base of the skull through the vertex without intravenous contrast. CT angiography is performed to the neck vessels. Multiplanar reformations were performed.  CONTRAST:  70mL OMNIPAQUE IOHEXOL 350 MG/ML SOLN  COMPARISON:  04/23/2014.  FINDINGS: Head CT:  Parenchymal imaging shows mild patchy low density in the deep white matter consistent with mild chronic small vessel disease. No sign of acute infarction, mass lesion, hemorrhage, hydrocephalus or extra-axial collection. No calvarial abnormality. Sinuses are clear.  CT angiography of the neck vessels: Lung apices are clear. Branching pattern of the brachiocephalic vessels from the arch is normal. The innominate artery and left common carotid artery share a common origin.  Both common carotid arteries are quite tortuous but are widely patent to the bifurcation regions. There is atherosclerotic  disease at both carotid bifurcations with calcified plaque. On the right, there is no stenosis. On the left, minimal diameter is 5 mm, the same as the more distal cervical ICA and therefore there is no stenosis. Both cervical internal carotid arteries show tortuosity. Both vessels are patent at the skullbase level.  Both vertebral arteries are patent without evidence of origin stenosis. The vertebral arteries are of similar size and are patent through the cervical region to the foramen magnum.  Soft tissues of the neck show no lesion.  IMPRESSION: Head CT: Mild chronic appearing small vessel changes of the white matter. No acute finding.  CTA of the neck: Atherosclerotic change at both carotid bifurcations but without stenosis.  The vessels in general are quite tortuous, suggesting a history of hypertension.   Electronically Signed   By: Nelson Chimes M.D.   On: 07/28/2014 12:45   Ct Angio Neck W/cm &/or Wo/cm  07/28/2014   CLINICAL DATA:  Left sided neck pain.  EXAM: CT HEAD WITHOUT  CONTRAST  CT angiography of the neck  TECHNIQUE: Contiguous axial images were obtained from the base of the skull through the vertex without intravenous contrast. CT angiography is performed to the neck vessels. Multiplanar reformations were performed.  CONTRAST:  48mL OMNIPAQUE IOHEXOL 350 MG/ML SOLN  COMPARISON:  04/23/2014.  FINDINGS: Head CT:  Parenchymal imaging shows mild patchy low density in the deep white matter consistent with mild chronic small vessel disease. No sign of acute infarction, mass lesion, hemorrhage, hydrocephalus or extra-axial collection. No calvarial abnormality. Sinuses are clear.  CT angiography of the neck vessels: Lung apices are clear. Branching pattern of the brachiocephalic vessels from the arch is normal. The innominate artery and left common carotid artery share a common origin.  Both common carotid arteries are quite tortuous but are widely patent to the bifurcation regions. There is  atherosclerotic disease at both carotid bifurcations with calcified plaque. On the right, there is no stenosis. On the left, minimal diameter is 5 mm, the same as the more distal cervical ICA and therefore there is no stenosis. Both  cervical internal carotid arteries show tortuosity. Both vessels are patent at the skullbase level.  Both vertebral arteries are patent without evidence of origin stenosis. The vertebral arteries are of similar size and are patent through the cervical region to the foramen magnum.  Soft tissues of the neck show no lesion.  IMPRESSION: Head CT: Mild chronic appearing small vessel changes of the white matter. No acute finding.  CTA of the neck: Atherosclerotic change at both carotid bifurcations but without stenosis.  The vessels in general are quite tortuous, suggesting a history of hypertension.   Electronically Signed   By: Nelson Chimes M.D.   On: 07/28/2014 12:45     EKG Interpretation   Date/Time:  Monday July 28 2014 10:17:40 EDT Ventricular Rate:  89 PR Interval:  145 QRS Duration: 90 QT Interval:  347 QTC Calculation: 422 R Axis:   -3 Text Interpretation:  Sinus rhythm Non-specific ST-t changes Anterior  leads No significant change since last tracing of 09/19/13 Confirmed by Hendricks Schwandt  MD, Gissell Barra [phone]) on 07/28/2014 10:56:43 AM     Filed Vitals:   07/28/14 1006 07/28/14 1250  BP: 124/84 127/79  Pulse: 97 71  Temp: 98.4 F (36.9 C)   TempSrc: Oral   Resp: 20 12  Height: 5\' 4"  (1.626 m)   Weight: 158 lb (71.668 kg)   SpO2: 98% 98%    MDM   Final diagnoses:  Neck pain on left side  Anemia, unspecified anemia type   89 female who presents today with left-sided neck pain atraumatic occurring intermittently since Friday. Exam is normal. CT angiotensin neck showed no evidence of acute abnormalities. Patient with EKG unchanged from prior. Labs are significant for anemia with a hemoglobin of 10.5. This is mildly decreased from prior 4.3. Patient is  advised. Shows no signs or symptoms of acute anemia. She is denies any rectal bleeding I will follow with her primary care for further assessment of this.  I personally performed the services described in this documentation, which was scribed in my presence. The recorded information has been reviewed and considered.   Shaune Pollack, MD 08/01/14 [phone]

## 2014-08-05 ENCOUNTER — Encounter (HOSPITAL_BASED_OUTPATIENT_CLINIC_OR_DEPARTMENT_OTHER): Payer: Medicare HMO

## 2014-08-05 ENCOUNTER — Encounter (HOSPITAL_COMMUNITY): Payer: Medicare HMO | Attending: Hematology and Oncology

## 2014-08-05 VITALS — BP 113/72 | HR 74 | Temp 98.6°F | Resp 18

## 2014-08-05 DIAGNOSIS — D509 Iron deficiency anemia, unspecified: Secondary | ICD-10-CM | POA: Diagnosis not present

## 2014-08-05 DIAGNOSIS — Q279 Congenital malformation of peripheral vascular system, unspecified: Secondary | ICD-10-CM

## 2014-08-05 LAB — CBC
HEMATOCRIT: 33.4 % — AB (ref 36.0–46.0)
Hemoglobin: 10.5 g/dL — ABNORMAL LOW (ref 12.0–15.0)
MCH: 27.9 pg (ref 26.0–34.0)
MCHC: 31.4 g/dL (ref 30.0–36.0)
MCV: 88.6 fL (ref 78.0–100.0)
Platelets: 351 10*3/uL (ref 150–400)
RBC: 3.77 MIL/uL — ABNORMAL LOW (ref 3.87–5.11)
RDW: 14.6 % (ref 11.5–15.5)
WBC: 4.4 10*3/uL (ref 4.0–10.5)

## 2014-08-05 LAB — FERRITIN: Ferritin: 20 ng/mL (ref 10–291)

## 2014-08-05 MED ORDER — SODIUM CHLORIDE 0.9 % IV SOLN
510.0000 mg | Freq: Once | INTRAVENOUS | Status: AC
  Administered 2014-08-05: 510 mg via INTRAVENOUS
  Filled 2014-08-05: qty 17

## 2014-08-05 MED ORDER — SODIUM CHLORIDE 0.9 % IV SOLN
Freq: Once | INTRAVENOUS | Status: AC
  Administered 2014-08-05: 10:00:00 via INTRAVENOUS

## 2014-08-05 MED ORDER — SODIUM CHLORIDE 0.9 % IJ SOLN
10.0000 mL | Freq: Once | INTRAMUSCULAR | Status: AC
  Administered 2014-08-05: 10 mL via INTRAVENOUS

## 2014-08-05 NOTE — Patient Instructions (Signed)
[facility] Discharge Instructions  RECOMMENDATIONS MADE BY THE CONSULTANT AND ANY TEST RESULTS WILL BE SENT TO YOUR REFERRING PHYSICIAN.  MEDICATIONS PRESCRIBED:  IV Ferahem 510 mg infusion today.  INSTRUCTIONS/FOLLOW-UP: Return to clinic next week for second dose IV [name] as scheduled.  Thank you for choosing [place] to provide your oncology and hematology care.  To afford each patient quality time with our providers, please arrive at least 15 minutes before your scheduled appointment time.  With your help, our goal is to use those 15 minutes to complete the necessary work-up to ensure our physicians have the information they need to help with your evaluation and healthcare recommendations.    Effective January 1st, 2014, we ask that you re-schedule your appointment with our physicians should you arrive 10 or more minutes late for your appointment.  We strive to give you quality time with our providers, and arriving late affects you and other patients whose appointments are after yours.    Again, thank you for choosing [facility].  Our hope is that these requests will decrease the amount of time that you wait before being seen by our physicians.       _____________________________________________________________  Should you have questions after your visit to [facility], please contact our office at [phone] between the hours of 8:30 a.m. and 4:30 p.m.  Voicemails left after 4:30 p.m. will not be returned until the following business day.  For prescription refill requests, have your pharmacy contact our office with your prescription refill request.    _______________________________________________________________  We hope that we have given you very good care.  You may receive a patient satisfaction survey in the mail, please complete it and return it as soon as possible.  We value your  feedback!  _______________________________________________________________  Have you asked about our STAR program?  STAR stands for Survivorship Training and Rehabilitation, and this is a nationally recognized cancer care program that focuses on survivorship and rehabilitation.  Cancer and cancer treatments may cause problems, such as, pain, making you feel tired and keeping you from doing the things that you need or want to do. Cancer rehabilitation can help. Our goal is to reduce these troubling effects and help you have the best quality of life possible.  You may receive a survey from a nurse that asks questions about your current state of health.  Based on the survey results, all eligible patients will be referred to the [facility] program for an evaluation so we can better serve you!  A frequently asked questions sheet is available upon request.

## 2014-08-05 NOTE — Progress Notes (Signed)
LABS FOR CBC,FERR 

## 2014-08-05 NOTE — Progress Notes (Signed)
Tolerated well

## 2014-08-07 ENCOUNTER — Other Ambulatory Visit (HOSPITAL_COMMUNITY): Payer: Self-pay | Admitting: Oncology

## 2014-08-12 ENCOUNTER — Encounter (HOSPITAL_COMMUNITY): Payer: Self-pay | Admitting: Oncology

## 2014-08-12 ENCOUNTER — Encounter (HOSPITAL_BASED_OUTPATIENT_CLINIC_OR_DEPARTMENT_OTHER): Payer: Medicare HMO

## 2014-08-12 ENCOUNTER — Other Ambulatory Visit (HOSPITAL_COMMUNITY): Payer: Self-pay | Admitting: Oncology

## 2014-08-12 VITALS — BP 123/72 | HR 72 | Temp 98.5°F | Resp 18

## 2014-08-12 DIAGNOSIS — Q279 Congenital malformation of peripheral vascular system, unspecified: Secondary | ICD-10-CM

## 2014-08-12 DIAGNOSIS — K219 Gastro-esophageal reflux disease without esophagitis: Secondary | ICD-10-CM

## 2014-08-12 DIAGNOSIS — D509 Iron deficiency anemia, unspecified: Secondary | ICD-10-CM

## 2014-08-12 MED ORDER — SODIUM CHLORIDE 0.9 % IV SOLN
Freq: Once | INTRAVENOUS | Status: AC
  Administered 2014-08-12: 20 mL/h via INTRAVENOUS

## 2014-08-12 MED ORDER — SODIUM CHLORIDE 0.9 % IV SOLN
510.0000 mg | Freq: Once | INTRAVENOUS | Status: AC
  Administered 2014-08-12: 510 mg via INTRAVENOUS
  Filled 2014-08-12: qty 17

## 2014-08-12 NOTE — Progress Notes (Signed)
Melody Mitchell Tolerated IV iron infusion without incident today.  Discharged ambulatory

## 2014-08-12 NOTE — Patient Instructions (Signed)
[facility] Discharge Instructions  RECOMMENDATIONS MADE BY THE CONSULTANT AND ANY TEST RESULTS WILL BE SENT TO YOUR REFERRING PHYSICIAN.  You had an iron infusion today.  If you have any questions or concerns please call the clinic  Thank you for choosing [facility] to provide your oncology and hematology care.  To afford each patient quality time with our providers, please arrive at least 15 minutes before your scheduled appointment time.  With your help, our goal is to use those 15 minutes to complete the necessary work-up to ensure our physicians have the information they need to help with your evaluation and healthcare recommendations.    Effective January 1st, 2014, we ask that you re-schedule your appointment with our physicians should you arrive 10 or more minutes late for your appointment.  We strive to give you quality time with our providers, and arriving late affects you and other patients whose appointments are after yours.    Again, thank you for choosing [facility].  Our hope is that these requests will decrease the amount of time that you wait before being seen by our physicians.       _____________________________________________________________  Should you have questions after your visit to [facility], please contact our office at [phone] between the hours of 8:30 a.m. and 5:00 p.m.  Voicemails left after 4:30 p.m. will not be returned until the following business day.  For prescription refill requests, have your pharmacy contact our office with your prescription refill request.

## 2014-10-26 NOTE — Progress Notes (Signed)
[name],[name], PA-C [address] [place] [address]  Iron deficiency anemia due to chronic blood loss  AVM (arteriovenous malformation) of colon, acquired  CURRENT THERAPY: IV [name] last given on 08/05/2014 and 08/12/2014.  INTERVAL HISTORY: Melody Mitchell 73 y.o. 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 Additionally, she is on PPI therapy for GERD which also may be a contributing factor.    I personally reviewed and went over laboratory results with the patient.  The results are noted within this dictation.  Melody Mitchell presented to the ED on 07/28/2014 with abdominal pain and neck pain.  Imaging studies were performed and are noted below.  I personally reviewed and went over radiographic studies with the patient.  The results are noted within this dictation.    Today, she denies any blood loss including blood in stool, black sticky stools, hematuria, gingival blood loss, hemoptysis, etc.  Hematologically, the patient denies any complaints and are less questioning is negative.   Past Medical History  Diagnosis Date  . High cholesterol   . Acid reflux   . Hx: recurrent pneumonia   . Anemia   . History of contact dermatitis   . Arthritis   . Bronchitis january 2014  . Hypertension   . Vertigo   . Hiatal hernia   . Reflux   . AVM (arteriovenous malformation) of colon, acquired 07/01/2009    Qualifier: Diagnosis of  By: Craige Cotta      has Iron deficiency anemia due to chronic blood loss; HEMORRHOIDS; AVM (arteriovenous malformation) of colon, acquired; GERD; High cholesterol; and Acid reflux on her problem list.     is allergic to sulfonamide derivatives and latex.  Melody Mitchell does not currently have medications on file.  Past Surgical History  Procedure Laterality Date  . Abdominal hysterectomy    . Appendectomy    . Cholecystectomy    .  Bilateral breast biopsies  benign    . Colonoscopy w/ biopsies      Denies any headaches, dizziness, double vision, fevers, chills, night sweats, nausea, vomiting, diarrhea, constipation, chest pain, heart palpitations, shortness of breath, blood in stool, black tarry stool, urinary pain, urinary burning, urinary frequency, hematuria.   PHYSICAL EXAMINATION  ECOG PERFORMANCE STATUS: 0 - Asymptomatic  Filed Vitals:   10/28/14 0900  BP: 143/90  Pulse: 88  Temp: 98.6 F (37 C)  Resp: 18    GENERAL:alert, no distress, well nourished, well developed, comfortable, cooperative and smiling SKIN: skin color, texture, turgor are normal, no rashes or significant lesions HEAD: Normocephalic EYES: normal, PERRLA, EOMI, Conjunctiva are pink and non-injected EARS: External ears normal OROPHARYNX:mucous membranes are moist  NECK: supple, trachea midline LYMPH:  not examined BREAST:not examined LUNGS: clear to auscultation  HEART: regular rate & rhythm ABDOMEN:abdomen soft, non-tender and normal bowel sounds BACK: Back symmetric, no curvature. EXTREMITIES:less then 2 second capillary refill, no joint deformities, effusion, or inflammation, no skin discoloration, no clubbing, no cyanosis  NEURO: alert & oriented x 3 with fluent speech, no focal motor/sensory deficits, gait normal    LABORATORY DATA: CBC    Component Value Date/Time   WBC 3.6* 10/28/2014 0958   RBC 3.91 10/28/2014 0958   RBC 3.22* 05/09/2009 0743   HGB 11.2* 10/28/2014 0958   HCT 34.9* 10/28/2014 0958   PLT 279 10/28/2014 0958   MCV 89.3 10/28/2014 0958  MCH 28.6 10/28/2014 0958   MCHC 32.1 10/28/2014 0958   RDW 15.9* 10/28/2014 0958   LYMPHSABS 1.0 07/28/2014 1051   MONOABS 0.4 07/28/2014 1051   EOSABS 0.1 07/28/2014 1051   BASOSABS 0.0 07/28/2014 1051      Chemistry      Component Value Date/Time   NA 142 07/28/2014 1051   K 3.9 07/28/2014 1051   CL 105 07/28/2014 1051   CO2 28 07/28/2014 1051   BUN 8  07/28/2014 1051   CREATININE 0.97 07/28/2014 1051      Component Value Date/Time   CALCIUM 9.4 07/28/2014 1051   ALKPHOS 115 04/23/2014 0806   AST 26 04/23/2014 0806   ALT 22 04/23/2014 0806   BILITOT 0.2* 04/23/2014 0806     Lab Results  Component Value Date   IRON 37* 02/06/2012   TIBC 250 02/06/2012   FERRITIN 20 08/05/2014     RADIOGRAPHIC STUDIES:  07/28/2014   CLINICAL DATA: Left sided neck pain.  EXAM: CT HEAD WITHOUT CONTRAST  CT angiography of the neck  TECHNIQUE: Contiguous axial images were obtained from the base of the skull through the vertex without intravenous contrast. CT angiography is performed to the neck vessels. Multiplanar reformations were performed.  CONTRAST: 34mL OMNIPAQUE IOHEXOL 350 MG/ML SOLN  COMPARISON: 04/23/2014.  FINDINGS: Head CT:  Parenchymal imaging shows mild patchy low density in the deep white matter consistent with mild chronic small vessel disease. No sign of acute infarction, mass lesion, hemorrhage, hydrocephalus or extra-axial collection. No calvarial abnormality. Sinuses are clear.  CT angiography of the neck vessels: Lung apices are clear. Branching pattern of the brachiocephalic vessels from the arch is normal. The innominate artery and left common carotid artery share a common origin.  Both common carotid arteries are quite tortuous but are widely patent to the bifurcation regions. There is atherosclerotic disease at both carotid bifurcations with calcified plaque. On the right, there is no stenosis. On the left, minimal diameter is 5 mm, the same as the more distal cervical ICA and therefore there is no stenosis. Both cervical internal carotid arteries show tortuosity. Both vessels are patent at the skullbase level.  Both vertebral arteries are patent without evidence of origin stenosis. The vertebral arteries are of similar size and are patent through the cervical region to the foramen  magnum.  Soft tissues of the neck show no lesion.  IMPRESSION: Head CT: Mild chronic appearing small vessel changes of the white matter. No acute finding.  CTA of the neck: Atherosclerotic change at both carotid bifurcations but without stenosis.  The vessels in general are quite tortuous, suggesting a history of hypertension.   Electronically Signed  By: Nelson Chimes M.D.  On: 07/28/2014 12:45      ASSESSMENT:  1. 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 Additionally, she is on PPI therapy for GERD which also may be a contributing factor resulting in malabsorption.  2. AVMs causing #1  3. GERD, on Nexium, possibly participating in #1 as well.  Patient Active Problem List   Diagnosis Date Noted  . High cholesterol   . Acid reflux   . GERD 01/03/2011  . HEMORRHOIDS 04/15/2010  . Iron deficiency anemia due to chronic blood loss 01/12/2010  . AVM (arteriovenous malformation) of colon, acquired 07/01/2009     PLAN:  1. I personally reviewed and went over laboratory results with the patient.  The results are noted within this dictation.  2. I personally reviewed and went over radiographic studies with the patient.  The results are noted within this dictation.   3. Chart reviewed 4. Labs every 3 months: CBC, ferritin 5. Next IV [name] in 3 months: 510 mg IV [name] in March 2016.  Sooner if needed per labs today. 6. Supportive therapy plan reviewed 7. Return in 6 months for follow-up   THERAPY PLAN:  Continue with labs every 3 months and administer IV [name] 510 mg on days 1 and 8 every 6 months (sooner if needed).  All questions were answered. The patient knows to call the clinic with any problems, questions or concerns. We can certainly see the patient much sooner if necessary.  Patient and plan discussed with Dr. Farrel Gobble and he is in agreement with the aforementioned.    Melody Mitchell 10/28/2014

## 2014-10-28 ENCOUNTER — Telehealth (HOSPITAL_COMMUNITY): Payer: Self-pay

## 2014-10-28 ENCOUNTER — Encounter (HOSPITAL_BASED_OUTPATIENT_CLINIC_OR_DEPARTMENT_OTHER): Payer: Medicare HMO

## 2014-10-28 ENCOUNTER — Encounter (HOSPITAL_COMMUNITY): Payer: Medicare HMO | Attending: Oncology | Admitting: Oncology

## 2014-10-28 ENCOUNTER — Encounter (HOSPITAL_COMMUNITY): Payer: Self-pay | Admitting: Oncology

## 2014-10-28 VITALS — BP 143/90 | HR 88 | Temp 98.6°F | Resp 18 | Wt 155.5 lb

## 2014-10-28 DIAGNOSIS — D500 Iron deficiency anemia secondary to blood loss (chronic): Secondary | ICD-10-CM

## 2014-10-28 DIAGNOSIS — K219 Gastro-esophageal reflux disease without esophagitis: Secondary | ICD-10-CM | POA: Diagnosis not present

## 2014-10-28 DIAGNOSIS — I10 Essential (primary) hypertension: Secondary | ICD-10-CM | POA: Insufficient documentation

## 2014-10-28 DIAGNOSIS — Z791 Long term (current) use of non-steroidal anti-inflammatories (NSAID): Secondary | ICD-10-CM | POA: Insufficient documentation

## 2014-10-28 DIAGNOSIS — Q2730 Arteriovenous malformation, site unspecified: Secondary | ICD-10-CM | POA: Diagnosis not present

## 2014-10-28 DIAGNOSIS — D509 Iron deficiency anemia, unspecified: Secondary | ICD-10-CM | POA: Diagnosis not present

## 2014-10-28 DIAGNOSIS — E780 Pure hypercholesterolemia: Secondary | ICD-10-CM | POA: Diagnosis not present

## 2014-10-28 DIAGNOSIS — Z9049 Acquired absence of other specified parts of digestive tract: Secondary | ICD-10-CM | POA: Insufficient documentation

## 2014-10-28 DIAGNOSIS — K5520 Angiodysplasia of colon without hemorrhage: Secondary | ICD-10-CM

## 2014-10-28 DIAGNOSIS — Z79899 Other long term (current) drug therapy: Secondary | ICD-10-CM | POA: Diagnosis not present

## 2014-10-28 LAB — CBC
HCT: 34.9 % — ABNORMAL LOW (ref 36.0–46.0)
Hemoglobin: 11.2 g/dL — ABNORMAL LOW (ref 12.0–15.0)
MCH: 28.6 pg (ref 26.0–34.0)
MCHC: 32.1 g/dL (ref 30.0–36.0)
MCV: 89.3 fL (ref 78.0–100.0)
PLATELETS: 279 10*3/uL (ref 150–400)
RBC: 3.91 MIL/uL (ref 3.87–5.11)
RDW: 15.9 % — ABNORMAL HIGH (ref 11.5–15.5)
WBC: 3.6 10*3/uL — AB (ref 4.0–10.5)

## 2014-10-28 LAB — FERRITIN: FERRITIN: 129 ng/mL (ref 10–291)

## 2014-10-28 NOTE — Telephone Encounter (Signed)
-----   Message from Baird [facility], PA-C sent at 10/28/2014  4:22 PM EST ----- Labs are great!

## 2014-10-28 NOTE — Progress Notes (Signed)
Labs for cbc,ferr 

## 2014-10-28 NOTE — Telephone Encounter (Signed)
Patient notified

## 2014-10-28 NOTE — Patient Instructions (Addendum)
[facility] Discharge Instructions  RECOMMENDATIONS MADE BY THE CONSULTANT AND ANY TEST RESULTS WILL BE SENT TO YOUR REFERRING PHYSICIAN.  Labs today are great.  They follow below.  Iron test is still pending.  Please report any significant blood loss to the [facility], (ie blood in stool, black tarry stool, blood in urine, etc). Labs in 3 months and 6 months, Return in March 2016 for an iron infusion Return in June 2016 for office visit. Continue follow-up with primary care provider.  CBC    Component Value Date/Time   WBC 3.6* 10/28/2014 0958   RBC 3.91 10/28/2014 0958   RBC 3.22* 05/09/2009 0743   HGB 11.2* 10/28/2014 0958   HCT 34.9* 10/28/2014 0958   PLT 279 10/28/2014 0958   MCV 89.3 10/28/2014 0958   MCH 28.6 10/28/2014 0958   MCHC 32.1 10/28/2014 0958   RDW 15.9* 10/28/2014 0958   LYMPHSABS 1.0 07/28/2014 1051   MONOABS 0.4 07/28/2014 1051   EOSABS 0.1 07/28/2014 1051   BASOSABS 0.0 07/28/2014 1051      Thank you for choosing [facility] to provide your oncology and hematology care.  To afford each patient quality time with our providers, please arrive at least 15 minutes before your scheduled appointment time.  With your help, our goal is to use those 15 minutes to complete the necessary work-up to ensure our physicians have the information they need to help with your evaluation and healthcare recommendations.    Effective January 1st, 2014, we ask that you re-schedule your appointment with our physicians should you arrive 10 or more minutes late for your appointment.  We strive to give you quality time with our providers, and arriving late affects you and other patients whose appointments are after yours.    Again, thank you for choosing [facility].  Our hope is that these requests will decrease the amount of time that you wait before being seen by our physicians.        _____________________________________________________________  Should you have questions after your visit to [facility], please contact our office at [phone] between the hours of 8:30 a.m. and 5:00 p.m.  Voicemails left after 4:30 p.m. will not be returned until the following business day.  For prescription refill requests, have your pharmacy contact our office with your prescription refill request.

## 2014-12-26 ENCOUNTER — Other Ambulatory Visit (HOSPITAL_COMMUNITY): Payer: Self-pay | Admitting: Oncology

## 2014-12-26 DIAGNOSIS — Z1231 Encounter for screening mammogram for malignant neoplasm of breast: Secondary | ICD-10-CM

## 2015-01-27 ENCOUNTER — Encounter (HOSPITAL_COMMUNITY): Payer: Medicaid Other | Attending: Oncology

## 2015-01-27 DIAGNOSIS — Z791 Long term (current) use of non-steroidal anti-inflammatories (NSAID): Secondary | ICD-10-CM | POA: Insufficient documentation

## 2015-01-27 DIAGNOSIS — Q2730 Arteriovenous malformation, site unspecified: Secondary | ICD-10-CM | POA: Diagnosis not present

## 2015-01-27 DIAGNOSIS — K219 Gastro-esophageal reflux disease without esophagitis: Secondary | ICD-10-CM | POA: Insufficient documentation

## 2015-01-27 DIAGNOSIS — D509 Iron deficiency anemia, unspecified: Secondary | ICD-10-CM

## 2015-01-27 DIAGNOSIS — Z9049 Acquired absence of other specified parts of digestive tract: Secondary | ICD-10-CM | POA: Insufficient documentation

## 2015-01-27 DIAGNOSIS — E780 Pure hypercholesterolemia: Secondary | ICD-10-CM | POA: Insufficient documentation

## 2015-01-27 DIAGNOSIS — Z79899 Other long term (current) drug therapy: Secondary | ICD-10-CM | POA: Diagnosis not present

## 2015-01-27 DIAGNOSIS — I10 Essential (primary) hypertension: Secondary | ICD-10-CM | POA: Diagnosis not present

## 2015-01-27 LAB — CBC
HCT: 33.5 % — ABNORMAL LOW (ref 36.0–46.0)
Hemoglobin: 10.7 g/dL — ABNORMAL LOW (ref 12.0–15.0)
MCH: 29.6 pg (ref 26.0–34.0)
MCHC: 31.9 g/dL (ref 30.0–36.0)
MCV: 92.5 fL (ref 78.0–100.0)
PLATELETS: 288 10*3/uL (ref 150–400)
RBC: 3.62 MIL/uL — ABNORMAL LOW (ref 3.87–5.11)
RDW: 14.3 % (ref 11.5–15.5)
WBC: 4.4 10*3/uL (ref 4.0–10.5)

## 2015-01-27 LAB — FERRITIN: FERRITIN: 44 ng/mL (ref 10–291)

## 2015-01-27 NOTE — Progress Notes (Signed)
LABS DRAWN

## 2015-01-29 ENCOUNTER — Encounter (HOSPITAL_COMMUNITY): Payer: Self-pay

## 2015-01-29 ENCOUNTER — Encounter (HOSPITAL_BASED_OUTPATIENT_CLINIC_OR_DEPARTMENT_OTHER): Payer: Medicaid Other

## 2015-01-29 VITALS — BP 158/78 | HR 63 | Temp 97.9°F | Resp 20

## 2015-01-29 DIAGNOSIS — Q279 Congenital malformation of peripheral vascular system, unspecified: Secondary | ICD-10-CM

## 2015-01-29 DIAGNOSIS — D500 Iron deficiency anemia secondary to blood loss (chronic): Secondary | ICD-10-CM | POA: Diagnosis not present

## 2015-01-29 DIAGNOSIS — D509 Iron deficiency anemia, unspecified: Secondary | ICD-10-CM

## 2015-01-29 DIAGNOSIS — K219 Gastro-esophageal reflux disease without esophagitis: Secondary | ICD-10-CM

## 2015-01-29 DIAGNOSIS — K5521 Angiodysplasia of colon with hemorrhage: Secondary | ICD-10-CM | POA: Diagnosis not present

## 2015-01-29 MED ORDER — SODIUM CHLORIDE 0.9 % IV SOLN
INTRAVENOUS | Status: DC
  Administered 2015-01-29: 10:00:00 via INTRAVENOUS

## 2015-01-29 MED ORDER — SODIUM CHLORIDE 0.9 % IV SOLN
510.0000 mg | Freq: Once | INTRAVENOUS | Status: AC
  Administered 2015-01-29: 510 mg via INTRAVENOUS
  Filled 2015-01-29: qty 17

## 2015-01-29 NOTE — Progress Notes (Signed)
[name] ended @ 10. Patient tolerated infusion well.

## 2015-01-29 NOTE — Patient Instructions (Signed)
[place] at [facility] Discharge Instructions  RECOMMENDATIONS MADE BY THE CONSULTANT AND ANY TEST RESULTS WILL BE SENT TO YOUR REFERRING PHYSICIAN.  You received IV [name] today. Call for concerns or questions. See you at your next appointment.  Thank you for choosing [place] at [facility] to provide your oncology and hematology care.  To afford each patient quality time with our provider, please arrive at least 15 minutes before your scheduled appointment time.    You need to re-schedule your appointment should you arrive 10 or more minutes late.  We strive to give you quality time with our providers, and arriving late affects you and other patients whose appointments are after yours.  Also, if you no show three or more times for appointments you may be dismissed from the clinic at the providers discretion.     Again, thank you for choosing [facility].  Our hope is that these requests will decrease the amount of time that you wait before being seen by our physicians.       _____________________________________________________________  Should you have questions after your visit to [facility], please contact our office at [phone] between the hours of 8:30 a.m. and 4:30 p.m.  Voicemails left after 4:30 p.m. will not be returned until the following business day.  For prescription refill requests, have your pharmacy contact our office.

## 2015-02-06 ENCOUNTER — Ambulatory Visit (HOSPITAL_COMMUNITY)
Admission: RE | Admit: 2015-02-06 | Discharge: 2015-02-06 | Disposition: A | Discharge status: Home / Self Care | Payer: Medicare Other | Source: Ambulatory Visit | Attending: Oncology | Admitting: Oncology

## 2015-02-06 DIAGNOSIS — Z1231 Encounter for screening mammogram for malignant neoplasm of breast: Secondary | ICD-10-CM | POA: Diagnosis present

## 2015-05-05 ENCOUNTER — Encounter (HOSPITAL_COMMUNITY): Payer: Self-pay | Admitting: Oncology

## 2015-05-05 ENCOUNTER — Ambulatory Visit (HOSPITAL_COMMUNITY): Payer: Medicare HMO | Admitting: Oncology

## 2015-05-05 ENCOUNTER — Other Ambulatory Visit (HOSPITAL_COMMUNITY): Payer: Self-pay | Admitting: Oncology

## 2015-05-05 ENCOUNTER — Encounter (HOSPITAL_BASED_OUTPATIENT_CLINIC_OR_DEPARTMENT_OTHER): Payer: Medicare Other | Admitting: Oncology

## 2015-05-05 ENCOUNTER — Encounter (HOSPITAL_COMMUNITY): Payer: Medicare Other | Attending: Oncology

## 2015-05-05 VITALS — BP 155/88 | HR 73 | Temp 98.7°F | Resp 20 | Wt 153.4 lb

## 2015-05-05 DIAGNOSIS — Z9049 Acquired absence of other specified parts of digestive tract: Secondary | ICD-10-CM | POA: Insufficient documentation

## 2015-05-05 DIAGNOSIS — D500 Iron deficiency anemia secondary to blood loss (chronic): Secondary | ICD-10-CM | POA: Diagnosis not present

## 2015-05-05 DIAGNOSIS — Z791 Long term (current) use of non-steroidal anti-inflammatories (NSAID): Secondary | ICD-10-CM | POA: Insufficient documentation

## 2015-05-05 DIAGNOSIS — I10 Essential (primary) hypertension: Secondary | ICD-10-CM | POA: Insufficient documentation

## 2015-05-05 DIAGNOSIS — Z79899 Other long term (current) drug therapy: Secondary | ICD-10-CM | POA: Diagnosis not present

## 2015-05-05 DIAGNOSIS — D509 Iron deficiency anemia, unspecified: Secondary | ICD-10-CM | POA: Diagnosis present

## 2015-05-05 DIAGNOSIS — Q2730 Arteriovenous malformation, site unspecified: Secondary | ICD-10-CM | POA: Insufficient documentation

## 2015-05-05 DIAGNOSIS — K219 Gastro-esophageal reflux disease without esophagitis: Secondary | ICD-10-CM | POA: Diagnosis not present

## 2015-05-05 DIAGNOSIS — E780 Pure hypercholesterolemia: Secondary | ICD-10-CM | POA: Diagnosis not present

## 2015-05-05 LAB — CBC
HCT: 36.8 % (ref 36.0–46.0)
Hemoglobin: 11.8 g/dL — ABNORMAL LOW (ref 12.0–15.0)
MCH: 29.1 pg (ref 26.0–34.0)
MCHC: 32.1 g/dL (ref 30.0–36.0)
MCV: 90.9 fL (ref 78.0–100.0)
Platelets: 282 10*3/uL (ref 150–400)
RBC: 4.05 MIL/uL (ref 3.87–5.11)
RDW: 13.9 % (ref 11.5–15.5)
WBC: 3.8 10*3/uL — ABNORMAL LOW (ref 4.0–10.5)

## 2015-05-05 LAB — FERRITIN: Ferritin: 73 ng/mL (ref 11–307)

## 2015-05-05 NOTE — Progress Notes (Signed)
Mitchell,[name], PA-C [address] [place] [address]  Iron deficiency anemia due to chronic blood loss  CURRENT THERAPY: IV [name] last given on 01/29/2015.  INTERVAL HISTORY: Melody Mitchell 74 y.o. 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  I personally reviewed and went over laboratory results with the patient.  The results are noted within this dictation.  I personally reviewed and went over radiographic studies with the patient.  The results are noted within this dictation. March 2016 mammogram was negative.   Today, she denies any blood loss including blood in stool, black sticky stools, hematuria, gingival blood loss, hemoptysis, etc.  She notes that she is doing well.  She denies any complaints today.   Past Medical History  Diagnosis Date  . High cholesterol   . Acid reflux   . Hx: recurrent pneumonia   . Anemia   . History of contact dermatitis   . Arthritis   . Bronchitis january 2014  . Hypertension   . Vertigo   . Hiatal hernia   . Reflux   . AVM (arteriovenous malformation) of colon, acquired 07/01/2009    Qualifier: Diagnosis of  By: Craige Cotta      has Iron deficiency anemia due to chronic blood loss; HEMORRHOIDS; AVM (arteriovenous malformation) of colon, acquired; GERD; High cholesterol; and Acid reflux on her problem list.     is allergic to sulfonamide derivatives and latex.  Melody Mitchell does not currently have medications on file.  Past Surgical History  Procedure Laterality Date  . Abdominal hysterectomy    . Appendectomy    . Cholecystectomy    . Bilateral breast biopsies  benign    . Colonoscopy w/ biopsies      Denies any headaches, dizziness, double vision, fevers, chills, night sweats, nausea, vomiting, diarrhea, constipation, chest pain, heart palpitations, shortness of breath, blood in stool, black tarry stool,  urinary pain, urinary burning, urinary frequency, hematuria.   PHYSICAL EXAMINATION  ECOG PERFORMANCE STATUS: 0 - Asymptomatic  Filed Vitals:   05/05/15 1042  BP: 155/88  Pulse: 73  Temp: 98.7 F (37.1 C)  Resp: 20    GENERAL:alert, no distress, well nourished, well developed, comfortable, cooperative and smiling SKIN: skin color, texture, turgor are normal, no rashes or significant lesions HEAD: Normocephalic EYES: normal, PERRLA, EOMI, Conjunctiva are pink and non-injected EARS: External ears normal OROPHARYNX:mucous membranes are moist  NECK: supple, trachea midline, no adenopathy LYMPH:  No adenopathy noted. BREAST:not examined LUNGS: clear to auscultation and percussion  HEART: regular rate & rhythm without murmur, rub, or gallop. ABDOMEN:abdomen soft, non-tender and normal bowel sounds BACK: Back symmetric, no curvature. EXTREMITIES:less then 2 second capillary refill, no joint deformities, effusion, or inflammation, no skin discoloration, no clubbing, no cyanosis, 1 + pitting edema pre-tibially bilaterally. NEURO: alert & oriented x 3 with fluent speech, no focal motor/sensory deficits, gait normal    LABORATORY DATA: CBC    Component Value Date/Time   WBC 3.8* 05/05/2015 0946   RBC 4.05 05/05/2015 0946   RBC 3.22* 05/09/2009 0743   HGB 11.8* 05/05/2015 0946   HCT 36.8 05/05/2015 0946   PLT 282 05/05/2015 0946   MCV 90.9 05/05/2015 0946   MCH 29.1 05/05/2015 0946   MCHC 32.1 05/05/2015 0946   RDW 13.9 05/05/2015 0946   LYMPHSABS 1.0 07/28/2014 1051   MONOABS 0.4 07/28/2014 1051  EOSABS 0.1 07/28/2014 1051   BASOSABS 0.0 07/28/2014 1051      Chemistry      Component Value Date/Time   NA 142 07/28/2014 1051   K 3.9 07/28/2014 1051   CL 105 07/28/2014 1051   CO2 28 07/28/2014 1051   BUN 8 07/28/2014 1051   CREATININE 0.97 07/28/2014 1051      Component Value Date/Time   CALCIUM 9.4 07/28/2014 1051   ALKPHOS 115 04/23/2014 0806   AST 26 04/23/2014  0806   ALT 22 04/23/2014 0806   BILITOT 0.2* 04/23/2014 0806     Lab Results  Component Value Date   IRON 37* 02/06/2012   TIBC 250 02/06/2012   FERRITIN 44 01/27/2015    RADIOGRAPHIC STUDIES:  02/06/2015  CLINICAL DATA: Screening.  EXAM: DIGITAL SCREENING BILATERAL MAMMOGRAM WITH CAD  COMPARISON: Previous exam(s).  ACR Breast Density Category b: There are scattered areas of fibroglandular density.  FINDINGS: There are no findings suspicious for malignancy. Images were processed with CAD.  IMPRESSION: No mammographic evidence of malignancy. A result letter of this screening mammogram will be mailed directly to the patient.  RECOMMENDATION: Screening mammogram in one year. (Code:[id number]-B-01Y)  BI-RADS CATEGORY 1: Negative.   Electronically Signed  By: Everlean Alstrom M.D.  On: 02/06/2015 15:01     ASSESSMENT:   Iron deficiency anemia due to chronic blood loss Iron deficiency anemia requiring IV [name] with failure of PO iron maintaining iron stores secondary to AVMs. Additionally, she is on PPI therapy for GERD which also may be a contributing factor resulting in malabsorption.   Labs today, 3 months, and 6 months: CBC diff, ferritin.  She notes B/L knee pain.  I have advised her to follow-up with her primary care provider regarding this.  On exam, B/L LE edema is noted and I have recommended she elevate her legs when resting.  Return in 6 months for follow-up.   THERAPY PLAN:  Continue with labs every 3 months and administer IV iron when indicated.   All questions were answered. The patient knows to call the clinic with any problems, questions or concerns. We can certainly see the patient much sooner if necessary.  Patient and plan discussed with Dr. Ancil Linsey and she is in agreement with the aforementioned.   Melody Mitchell 05/05/2015 11:05 AM

## 2015-05-05 NOTE — Progress Notes (Signed)
LABS DRAWN

## 2015-05-05 NOTE — Patient Instructions (Addendum)
[place] at [facility] Discharge Instructions  RECOMMENDATIONS MADE BY THE CONSULTANT AND ANY TEST RESULTS WILL BE SENT TO YOUR REFERRING PHYSICIAN.  Exam completed by Kirby Crigler today Follow-up with labs in 3 and 6 months and office visit in 6 months. Please call the clinic if you have any questions or concerns  Thank you for choosing [place] at [facility] to provide your oncology and hematology care.  To afford each patient quality time with our provider, please arrive at least 15 minutes before your scheduled appointment time.    You need to re-schedule your appointment should you arrive 10 or more minutes late.  We strive to give you quality time with our providers, and arriving late affects you and other patients whose appointments are after yours.  Also, if you no show three or more times for appointments you may be dismissed from the clinic at the providers discretion.     Again, thank you for choosing [facility].  Our hope is that these requests will decrease the amount of time that you wait before being seen by our physicians.       _____________________________________________________________  Should you have questions after your visit to [facility], please contact our office at [phone] between the hours of 8:30 a.m. and 4:30 p.m.  Voicemails left after 4:30 p.m. will not be returned until the following business day.  For prescription refill requests, have your pharmacy contact our office.

## 2015-05-05 NOTE — Assessment & Plan Note (Addendum)
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 Additionally, she is on PPI therapy for GERD which also may be a contributing factor resulting in malabsorption.   Labs today, 3 months, and 6 months: CBC diff, ferritin.  She notes B/L knee pain.  I have advised her to follow-up with her primary care provider regarding this.  On exam, B/L LE edema is noted and I have recommended she elevate her legs when resting.  Return in 6 months for follow-up.

## 2015-05-08 ENCOUNTER — Ambulatory Visit (HOSPITAL_COMMUNITY): Payer: Medicare Other

## 2015-05-08 ENCOUNTER — Encounter (HOSPITAL_BASED_OUTPATIENT_CLINIC_OR_DEPARTMENT_OTHER): Payer: Medicare Other

## 2015-05-08 VITALS — BP 131/78 | HR 69 | Temp 98.6°F | Resp 16

## 2015-05-08 DIAGNOSIS — D500 Iron deficiency anemia secondary to blood loss (chronic): Secondary | ICD-10-CM

## 2015-05-08 MED ORDER — SODIUM CHLORIDE 0.9 % IV SOLN
INTRAVENOUS | Status: DC
  Administered 2015-05-08: 10:00:00 via INTRAVENOUS

## 2015-05-08 MED ORDER — SODIUM CHLORIDE 0.9 % IV SOLN
125.0000 mg | Freq: Once | INTRAVENOUS | Status: AC
  Administered 2015-05-08: 125 mg via INTRAVENOUS
  Filled 2015-05-08: qty 10

## 2015-05-08 MED ORDER — SODIUM CHLORIDE 0.9 % IJ SOLN
10.0000 mL | Freq: Once | INTRAMUSCULAR | Status: AC
  Administered 2015-05-08: 10 mL via INTRAVENOUS

## 2015-05-08 NOTE — Progress Notes (Signed)
Tolerated iron infusion well. 

## 2015-05-08 NOTE — Patient Instructions (Signed)
[place] [facility] Discharge Instructions  RECOMMENDATIONS MADE BY THE CONSULTANT AND ANY TEST RESULTS WILL BE SENT TO YOUR REFERRING PHYSICIAN.  Ferric gluconate 125 mg iron infusion today as ordered. Return as scheduled.  Thank you for choosing [place] [facility] to provide your oncology and hematology care.  To afford each patient quality time with our provider, please arrive at least 15 minutes before your scheduled appointment time.    You need to re-schedule your appointment should you arrive 10 or more minutes late.  We strive to give you quality time with our providers, and arriving late affects you and other patients whose appointments are after yours.  Also, if you no show three or more times for appointments you may be dismissed from the clinic at the providers discretion.     Again, thank you for choosing [facility].  Our hope is that these requests will decrease the amount of time that you wait before being seen by our physicians.       _____________________________________________________________  Should you have questions after your visit to [facility], please contact our office at [phone] between the hours of 8:30 a.m. and 4:30 p.m.  Voicemails left after 4:30 p.m. will not be returned until the following business day.  For prescription refill requests, have your pharmacy contact our office.

## 2015-06-05 ENCOUNTER — Emergency Department (HOSPITAL_COMMUNITY)
Admission: EM | Admit: 2015-06-05 | Discharge: 2015-06-05 | Disposition: A | Discharge status: Home / Self Care | Payer: Medicare Other | Attending: Emergency Medicine | Admitting: Emergency Medicine

## 2015-06-05 ENCOUNTER — Encounter (HOSPITAL_COMMUNITY): Payer: Self-pay | Admitting: *Deleted

## 2015-06-05 ENCOUNTER — Emergency Department (HOSPITAL_COMMUNITY): Payer: Medicare Other

## 2015-06-05 DIAGNOSIS — Z8701 Personal history of pneumonia (recurrent): Secondary | ICD-10-CM | POA: Insufficient documentation

## 2015-06-05 DIAGNOSIS — I10 Essential (primary) hypertension: Secondary | ICD-10-CM | POA: Insufficient documentation

## 2015-06-05 DIAGNOSIS — Z90710 Acquired absence of both cervix and uterus: Secondary | ICD-10-CM | POA: Diagnosis not present

## 2015-06-05 DIAGNOSIS — E780 Pure hypercholesterolemia: Secondary | ICD-10-CM | POA: Diagnosis not present

## 2015-06-05 DIAGNOSIS — Z87891 Personal history of nicotine dependence: Secondary | ICD-10-CM | POA: Insufficient documentation

## 2015-06-05 DIAGNOSIS — K921 Melena: Secondary | ICD-10-CM | POA: Diagnosis present

## 2015-06-05 DIAGNOSIS — R140 Abdominal distension (gaseous): Secondary | ICD-10-CM

## 2015-06-05 DIAGNOSIS — Z872 Personal history of diseases of the skin and subcutaneous tissue: Secondary | ICD-10-CM | POA: Insufficient documentation

## 2015-06-05 DIAGNOSIS — Z7951 Long term (current) use of inhaled steroids: Secondary | ICD-10-CM | POA: Diagnosis not present

## 2015-06-05 DIAGNOSIS — Z8719 Personal history of other diseases of the digestive system: Secondary | ICD-10-CM | POA: Insufficient documentation

## 2015-06-05 DIAGNOSIS — R109 Unspecified abdominal pain: Secondary | ICD-10-CM | POA: Diagnosis not present

## 2015-06-05 DIAGNOSIS — R5383 Other fatigue: Secondary | ICD-10-CM | POA: Insufficient documentation

## 2015-06-05 DIAGNOSIS — Z8639 Personal history of other endocrine, nutritional and metabolic disease: Secondary | ICD-10-CM | POA: Insufficient documentation

## 2015-06-05 DIAGNOSIS — M1990 Unspecified osteoarthritis, unspecified site: Secondary | ICD-10-CM | POA: Diagnosis not present

## 2015-06-05 DIAGNOSIS — Z791 Long term (current) use of non-steroidal anti-inflammatories (NSAID): Secondary | ICD-10-CM | POA: Diagnosis not present

## 2015-06-05 DIAGNOSIS — Z862 Personal history of diseases of the blood and blood-forming organs and certain disorders involving the immune mechanism: Secondary | ICD-10-CM | POA: Insufficient documentation

## 2015-06-05 DIAGNOSIS — Z79899 Other long term (current) drug therapy: Secondary | ICD-10-CM | POA: Insufficient documentation

## 2015-06-05 DIAGNOSIS — Z9049 Acquired absence of other specified parts of digestive tract: Secondary | ICD-10-CM | POA: Insufficient documentation

## 2015-06-05 DIAGNOSIS — K219 Gastro-esophageal reflux disease without esophagitis: Secondary | ICD-10-CM | POA: Diagnosis not present

## 2015-06-05 LAB — COMPREHENSIVE METABOLIC PANEL
ALBUMIN: 3.7 g/dL (ref 3.5–5.0)
ALK PHOS: 76 U/L (ref 38–126)
ALT: 16 U/L (ref 14–54)
AST: 23 U/L (ref 15–41)
Anion gap: 7 (ref 5–15)
BILIRUBIN TOTAL: 0.2 mg/dL — AB (ref 0.3–1.2)
BUN: 12 mg/dL (ref 6–20)
CHLORIDE: 104 mmol/L (ref 101–111)
CO2: 28 mmol/L (ref 22–32)
Calcium: 9 mg/dL (ref 8.9–10.3)
Creatinine, Ser: 0.86 mg/dL (ref 0.44–1.00)
GFR calc non Af Amer: >60 mL/min (ref >60)
GLUCOSE: 94 mg/dL (ref 65–99)
Potassium: 3.9 mmol/L (ref 3.5–5.1)
Sodium: 139 mmol/L (ref 135–145)
Total Protein: 7.1 g/dL (ref 6.5–8.1)

## 2015-06-05 LAB — CBC WITH DIFFERENTIAL/PLATELET
BASOS PCT: 0 % (ref 0–1)
Basophils Absolute: 0 10*3/uL (ref 0.0–0.1)
EOS ABS: 0.2 10*3/uL (ref 0.0–0.7)
Eosinophils Relative: 4 % (ref 0–5)
HEMATOCRIT: 35.2 % — AB (ref 36.0–46.0)
Hemoglobin: 11.1 g/dL — ABNORMAL LOW (ref 12.0–15.0)
LYMPHS ABS: 1.2 10*3/uL (ref 0.7–4.0)
Lymphocytes Relative: 34 % (ref 12–46)
MCH: 28.7 pg (ref 26.0–34.0)
MCHC: 31.5 g/dL (ref 30.0–36.0)
MCV: 91 fL (ref 78.0–100.0)
Monocytes Absolute: 0.3 10*3/uL (ref 0.1–1.0)
Monocytes Relative: 7 % (ref 3–12)
NEUTROS ABS: 2 10*3/uL (ref 1.7–7.7)
Neutrophils Relative %: 55 % (ref 43–77)
Platelets: 302 10*3/uL (ref 150–400)
RBC: 3.87 MIL/uL (ref 3.87–5.11)
RDW: 14.6 % (ref 11.5–15.5)
WBC: 3.7 10*3/uL — ABNORMAL LOW (ref 4.0–10.5)

## 2015-06-05 LAB — URINALYSIS, ROUTINE W REFLEX MICROSCOPIC
Bilirubin Urine: NEGATIVE
GLUCOSE, UA: NEGATIVE mg/dL
Hgb urine dipstick: NEGATIVE
Ketones, ur: NEGATIVE mg/dL
Nitrite: NEGATIVE
PROTEIN: NEGATIVE mg/dL
SPECIFIC GRAVITY, URINE: 1.01 (ref 1.005–1.030)
UROBILINOGEN UA: 0.2 mg/dL (ref 0.0–1.0)
pH: 6.5 (ref 5.0–8.0)

## 2015-06-05 LAB — URINE MICROSCOPIC-ADD ON

## 2015-06-05 LAB — LIPASE, BLOOD: Lipase: 51 U/L (ref 22–51)

## 2015-06-05 LAB — POC OCCULT BLOOD, ED: FECAL OCCULT BLD: POSITIVE — AB

## 2015-06-05 LAB — I-STAT CG4 LACTIC ACID, ED: Lactic Acid, Venous: 1.19 mmol/L (ref 0.5–2.0)

## 2015-06-05 MED ORDER — IOHEXOL 300 MG/ML  SOLN
50.0000 mL | Freq: Once | INTRAMUSCULAR | Status: AC | PRN
  Administered 2015-06-05: 50 mL via ORAL

## 2015-06-05 MED ORDER — DICYCLOMINE HCL 20 MG PO TABS: 20.0000 mg | ORAL_TABLET | Freq: Two times a day (BID) | ORAL | Status: DC

## 2015-06-05 MED ORDER — SIMETHICONE 125 MG PO CAPS: 1.0000 | ORAL_CAPSULE | ORAL | Status: DC | PRN

## 2015-06-05 MED ORDER — IOHEXOL 300 MG/ML  SOLN
100.0000 mL | Freq: Once | INTRAMUSCULAR | Status: AC | PRN
  Administered 2015-06-05: 100 mL via INTRAVENOUS

## 2015-06-05 NOTE — ED Provider Notes (Addendum)
CSN: [id number]     Arrival date & time 06/05/15  1232 History   First MD Initiated Contact with Patient 06/05/15 1246     Chief Complaint  Patient presents with  . Blood In Stools     (Consider location/radiation/quality/duration/timing/severity/associated sxs/prior Treatment) HPI Comments: Patient presents to the emergency room for evaluation of abdominal discomfort and distention. She reports that symptoms have been ongoing for several days. She has noticed progressive worsening of abdominal distention over this period of time. She also has been experiencing dark stools, possible rectal bleeding. She has not had any rectal pain. There is no fever. Patient reports generalized weakness. She has not had any chest pain or shortness of breath.   Past Medical History  Diagnosis Date  . High cholesterol   . Acid reflux   . Hx: recurrent pneumonia   . Anemia   . History of contact dermatitis   . Arthritis   . Bronchitis january 2014  . Hypertension   . Vertigo   . Hiatal hernia   . Reflux   . 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 Abdominal hysterectomy    . Appendectomy    . Cholecystectomy    . Bilateral breast biopsies  benign    . Colonoscopy w/ biopsies     Family History  Problem Relation Age of Onset  . Heart failure Mother   . Diabetes Sister    History  Substance Use Topics  . Smoking status: Former Research scientist (life sciences)  . Smokeless tobacco: Never Used  . Alcohol Use: No   OB History    No data available     Review of Systems  Constitutional: Positive for fatigue.  Gastrointestinal: Positive for abdominal pain and abdominal distention. Negative for vomiting.  All other systems reviewed and are negative.     Allergies  Sulfonamide derivatives and Latex  Home Medications   Prior to Admission medications   Medication Sig Start Date End Date  Taking? Authorizing Provider  acetaminophen (TYLENOL) 500 MG tablet Take 500 mg by mouth every 6 (six) hours as needed for mild pain.   Yes Historical Provider, MD  amitriptyline (ELAVIL) 25 MG tablet Take 25 mg by mouth at bedtime as needed for sleep.    Yes Historical Provider, MD  celecoxib (CELEBREX) 100 MG capsule Take 100 mg by mouth daily.   Yes Historical Provider, MD  Cetirizine HCl 10 MG CAPS Take 1 capsule by mouth daily.   Yes Historical Provider, MD  docusate sodium (COLACE) 100 MG capsule Take 100 mg by mouth daily as needed for mild constipation or moderate constipation.    Yes Historical Provider, MD  escitalopram (LEXAPRO) 10 MG tablet Take 10 mg by mouth daily. 10/25/13  Yes Historical Provider, MD  esomeprazole (NEXIUM) 40 MG capsule Take 40 mg by mouth daily before breakfast.     Yes Historical Provider, MD  fish oil-omega-3 fatty acids 1000 MG capsule Take 1 g by mouth daily.    Yes Historical Provider, MD  fluticasone (FLONASE) 50 MCG/ACT nasal spray Place 2 sprays into both nostrils daily.  04/11/13  Yes Historical Provider, MD  methylcellulose (ARTIFICIAL TEARS) 1 % ophthalmic solution Place 1 drop into both eyes 2 (two) times daily as needed (dry eyes).   Yes Historical Provider, MD  Multiple Vitamin (MULTIVITAMIN) capsule Take 1 capsule by mouth daily.  Yes Historical Provider, MD  ondansetron (ZOFRAN-ODT) 4 MG disintegrating tablet Take 4 mg by mouth every 8 (eight) hours as needed for nausea. 04/23/14  Yes Fredia Sorrow, MD  potassium chloride SA (K-DUR,KLOR-CON) 20 MEQ tablet Take 20 mEq by mouth daily.   Yes Historical Provider, MD  traMADol (ULTRAM) 50 MG tablet Take 50 mg by mouth every 6 (six) hours as needed. For pain   Yes Historical Provider, MD  triamcinolone ointment (KENALOG) 0.1 % Apply 1 application topically as needed (rash).  02/26/14  Yes Historical Provider, MD  zolpidem (AMBIEN) 10 MG tablet Take 10 mg by mouth at bedtime as needed for sleep.  02/26/14   Yes Historical Provider, MD   BP 172/104 mmHg  Pulse 52  Temp(Src) 98.8 F (37.1 C) (Oral)  Resp 13  Ht 5\' 4"  (1.626 m)  Wt 148 lb (67.132 kg)  BMI 25.39 kg/m2  SpO2 94% Physical Exam  Constitutional: She is oriented to person, place, and time. She appears well-developed and well-nourished. No distress.  HENT:  Head: Normocephalic and atraumatic.  Right Ear: Hearing normal.  Left Ear: Hearing normal.  Nose: Nose normal.  Mouth/Throat: Oropharynx is clear and moist and mucous membranes are normal.  Eyes: Conjunctivae and EOM are normal. Pupils are equal, round, and reactive to light.  Neck: Normal range of motion. Neck supple.  Cardiovascular: Regular rhythm, S1 normal and S2 normal.  Exam reveals no gallop and no friction rub.   No murmur heard. Pulmonary/Chest: Effort normal and breath sounds normal. No respiratory distress. She exhibits no tenderness.  Abdominal: Soft. Normal appearance and bowel sounds are normal. She exhibits distension. There is no hepatosplenomegaly. There is no tenderness. There is no rebound, no guarding, no tenderness at McBurney's point and negative Murphy's sign. No hernia.  Musculoskeletal: Normal range of motion.  Neurological: She is alert and oriented to person, place, and time. She has normal strength. No cranial nerve deficit or sensory deficit. Coordination normal. GCS eye subscore is 4. GCS verbal subscore is 5. GCS motor subscore is 6.  Skin: Skin is warm, dry and intact. No rash noted. No cyanosis.  Psychiatric: She has a normal mood and affect. Her speech is normal and behavior is normal. Thought content normal.  Nursing note and vitals reviewed.   ED Course  Procedures (including critical care time) Labs Review Labs Reviewed  CBC WITH DIFFERENTIAL/PLATELET - Abnormal; Notable for the following:    WBC 3.7 (*)    Hemoglobin 11.1 (*)    HCT 35.2 (*)    All other components within normal limits  COMPREHENSIVE METABOLIC PANEL - Abnormal;  Notable for the following:    Total Bilirubin 0.2 (*)    All other components within normal limits  URINALYSIS, ROUTINE W REFLEX MICROSCOPIC (NOT AT [facility]) - Abnormal; Notable for the following:    Leukocytes, UA SMALL (*)    All other components within normal limits  URINE MICROSCOPIC-ADD ON - Abnormal; Notable for the following:    Squamous Epithelial / LPF FEW (*)    Bacteria, UA FEW (*)    All other components within normal limits  POC OCCULT BLOOD, ED - Abnormal; Notable for the following:    Fecal Occult Bld POSITIVE (*)    All other components within normal limits  LIPASE, BLOOD  I-STAT CG4 LACTIC ACID, ED    Imaging Review Ct Abdomen Pelvis W Contrast  06/05/2015   CLINICAL DATA:  Three day history of generalized abdominal pain and distension associated  with dark stools and generalized weakness. Imaging earlier today questioned a sclerotic focus involving T11. Surgical history includes hysterectomy, appendectomy and cholecystectomy.  EXAM: CT ABDOMEN AND PELVIS WITH CONTRAST  TECHNIQUE: Multidetector CT imaging of the abdomen and pelvis was performed using the standard protocol following bolus administration of intravenous contrast.  CONTRAST:  171mL OMNIPAQUE IOHEXOL 300 MG/ML IV. Oral contrast was also administered.  COMPARISON:  Acute abdomen series earlier same day. CT abdomen and pelvis 10/01/2008, 07/29/2008.  FINDINGS: Hepatobiliary: Liver normal in size and appearance. Gallbladder surgically absent. No unexpected biliary ductal dilation.  Spleen:  Normal in size and appearance.  Pancreas:  Normal in appearance.  No pancreatic ductal dilation.  Adrenal glands:  Normal in appearance.  Genitourinary: Both kidneys normal in size and appearance. No evidence of urinary tract calculi. Normal-appearing decompressed urinary bladder.  Uterus surgically absent. Left ovary present in the low left side of the pelvis containing a dystrophic calcification, unchanged in appearance since the prior  examinations.  Gastrointestinal: Stomach moderately distended with oral contrast but normal in appearance. Normal appearing small bowel. Expected stool burden throughout normal appearing colon. Mobile cecum present in the right upper pelvis. Appendix surgically absent.  Ascites:  Absent.  Vascular: Moderate aortoiliofemoral atherosclerosis without aneurysm. Patent visceral arteries. Anatomic variant in that the right renal artery arises from the anterior aorta.  Lymphatic:  No pathologic lymphadenopathy in the abdomen or pelvis.  Other findings: Mild elevation of the left hemidiaphragm. Very small umbilical hernia containing fat.  Musculoskeletal: Marked disc space narrowing at T11-12 with sclerosis in the endplates adjacent to the disc, accounting for the findings on imaging earlier today. No evidence of osseous metastatic disease. Mild osseous demineralization.  Visualized lower thorax: Heart mildly enlarged, not significantly changed since the prior examinations. Visualized lung bases clear apart from minimal scarring in the right middle lobe and lingula.  IMPRESSION: 1. Distended stomach after oral contrast administration. This is not necessarily pathologic. Please correlate clinically with symptoms of gastroparesis. 2. No acute abnormality involving the abdomen or pelvis. 3. Sclerosis in the endplates adjacent to the T11-12 disc secondary to severe degenerative disc disease accounting for the findings on the imaging study earlier today. No evidence of osseous metastatic disease.   Electronically Signed   By: Evangeline Dakin M.D.   On: 06/05/2015 15:55   Dg Abd Acute W/chest  06/05/2015   CLINICAL DATA:  Abdominal distention  EXAM: DG ABDOMEN ACUTE W/ 1V CHEST  COMPARISON:  Chest radiograph September 19, 2013; abdomen series Apr 04, 2012  FINDINGS: PA chest: There is slight scarring in the left base. Lungs are otherwise clear. The heart size and pulmonary vascularity are normal. No adenopathy. Aorta is  tortuous with atherosclerotic change, stable.  Supine and upright abdomen: There is moderate stool throughout the colon. There is no bowel dilatation or air-fluid level suggesting obstruction. No free air. There are surgical clips in the right upper quadrant region. There are phleboliths in the pelvis. There is sclerosis in the right side of the T11 vertebral body.  IMPRESSION: No obstruction or free air. Sclerosis in the right side of the T11 vertebral body, a finding not present previously. A sclerotic neoplastic focus must be of concern given this appearance. Thoracic spine radiographs with particular attention to the T11 region advised.  No lung edema or consolidation.  Slight scarring left lung base.   Electronically Signed   By: Lowella Grip III M.D.   On: 06/05/2015 14:22     EKG Interpretation  Date/Time:  Friday June 05 2015 13:56:33 EDT Ventricular Rate:  65 PR Interval:  156 QRS Duration: 90 QT Interval:  422 QTC Calculation: 439 R Axis:   -8 Text Interpretation:  Sinus rhythm Low voltage, precordial leads Abnormal  R-wave progression, early transition Nonspecific T abnormalities, anterior  leads No significant change since last tracing Confirmed by Rashauna Tep  MD,  Jodi Criscuolo ([id number]) on 06/05/2015 2:04:42 PM      MDM   Final diagnoses:  Abdominal distension  Abdominal pain, acute   Patient presents to the emergency department for abdominal distention and rectal bleeding. She's not sure if she was bit him, but stools were dark for the last 3 days and she has been experiencing distended abdomen. She does have mild distention on examination but no tenderness. She has normal bowel sounds. Hemoglobin is at her baseline. Rectal exam revealed brown stool that was heme positive.  X-ray did not show any evidence of obstruction. CT scan was performed. There is no evidence of diverticulitis, colitis, other pathology. She has severe degenerative changes at T11 that were seen on her  plain film x-ray. She has a distended stomach, likely the cause of the patient's abdominal distention. Reviewing her records reveals that she does have a history of chronic GI blood loss secondary to AVMs. As she has stable vital signs, stable hemoglobin, she does not require any intervention currently. She is appropriate for discharge, follow-up closely with PCP and GI as an outpatient. Return if her symptoms worsen.    Orpah Greek, MD 06/05/15 [place], MD 06/05/15 [phone]

## 2015-06-05 NOTE — Discharge Instructions (Signed)
Bloating Bloating is the feeling of fullness in your belly. You may feel as though your pants are too tight. Often the cause of bloating is overeating, retaining fluids, or having gas in your bowel. It is also caused by swallowing air and eating foods that cause gas. Irritable bowel syndrome is one of the most common causes of bloating. Constipation is also a common cause. Sometimes more serious problems can cause bloating. SYMPTOMS  Usually there is a feeling of fullness, as though your abdomen is bulged out. There may be mild discomfort.  DIAGNOSIS  Usually no particular testing is necessary for most bloating. If the condition persists and seems to become worse, your caregiver may do additional testing.  TREATMENT   There is no direct treatment for bloating.  Do not put gas into the bowel. Avoid chewing gum and sucking on candy. These tend to make you swallow air. Swallowing air can also be a nervous habit. Try to avoid this.  Avoiding high residue diets will help. Eat foods with soluble fibers (examples include root vegetables, apples, or barley) and substitute dairy products with soy and rice products. This helps irritable bowel syndrome.  If constipation is the cause, then a high residue diet with more fiber will help.  Avoid carbonated beverages.  Over-the-counter preparations are available that help reduce gas. Your pharmacist can help you with this. SEEK MEDICAL CARE IF:   Bloating continues and seems to be getting worse.  You notice a weight gain.  You have a weight loss but the bloating is getting worse.  You have changes in your bowel habits or develop nausea or vomiting. SEEK IMMEDIATE MEDICAL CARE IF:   You develop shortness of breath or swelling in your legs.  You have an increase in abdominal pain or develop chest pain. Document Released: 08/31/2006 Document Revised: 01/23/2012 Document Reviewed: 10/19/2007 [facility] Patient Information 2015 [place], [place]. This  information is not intended to replace advice given to you by your health care provider. Make sure you discuss any questions you have with your health care provider. Gastrointestinal Bleeding Gastrointestinal (GI) bleeding means there is bleeding somewhere along the digestive tract, between the mouth and anus. CAUSES  There are many different problems that can cause GI bleeding. Possible causes include:  Esophagitis. This is inflammation, irritation, or swelling of the esophagus.  Hemorrhoids.These are veins that are full of blood (engorged) in the rectum. They cause pain, inflammation, and may bleed.  Anal fissures.These are areas of painful tearing which may bleed. They are often caused by passing hard stool.  Diverticulosis.These are pouches that form on the colon over time, with age, and may bleed significantly.  Diverticulitis.This is inflammation in areas with diverticulosis. It can cause pain, fever, and bloody stools, although bleeding is rare.  Polyps and cancer. Colon cancer often starts out as precancerous polyps.  Gastritis and ulcers.Bleeding from the upper gastrointestinal tract (near the stomach) may travel through the intestines and produce black, sometimes tarry, often bad smelling stools. In certain cases, if the bleeding is fast enough, the stools may not be black, but red. This condition may be life-threatening. SYMPTOMS   Vomiting bright red blood or material that looks like coffee grounds.  Bloody, black, or tarry stools. DIAGNOSIS  Your caregiver may diagnose your condition by taking your history and performing a physical exam. More tests may be needed, including:  X-rays and other imaging tests.  Esophagogastroduodenoscopy (EGD). This test uses a flexible, lighted tube to look at your esophagus, stomach,  and small intestine.  Colonoscopy. This test uses a flexible, lighted tube to look at your colon. TREATMENT  Treatment depends on the cause of your  bleeding.   For bleeding from the esophagus, stomach, small intestine, or colon, the caregiver doing your EGD or colonoscopy may be able to stop the bleeding as part of the procedure.  Inflammation or infection of the colon can be treated with medicines.  Many rectal problems can be treated with creams, suppositories, or warm baths.  Surgery is sometimes needed.  Blood transfusions are sometimes needed if you have lost a lot of blood. If bleeding is slow, you may be allowed to go home. If there is a lot of bleeding, you will need to stay in the hospital for observation. HOME CARE INSTRUCTIONS   Take any medicines exactly as prescribed.  Keep your stools soft by eating foods that are high in fiber. These foods include whole grains, legumes, fruits, and vegetables. Prunes (1 to 3 a day) work well for many people.  Drink enough fluids to keep your urine clear or pale yellow. SEEK IMMEDIATE MEDICAL CARE IF:   Your bleeding increases.  You feel lightheaded, weak, or you faint.  You have severe cramps in your back or abdomen.  You pass large blood clots in your stool.  Your problems are getting worse. MAKE SURE YOU:   Understand these instructions.  Will watch your condition.  Will get help right away if you are not doing well or get worse. Document Released: 10/28/2000 Document Revised: 10/17/2012 Document Reviewed: 10/10/2011 [facility] Patient Information 2015 [place], [place]. This information is not intended to replace advice given to you by your health care provider. Make sure you discuss any questions you have with your health care provider.

## 2015-06-05 NOTE — ED Notes (Signed)
Dr. Betsey Holiday okay with bp to go home.

## 2015-06-05 NOTE — ED Notes (Signed)
Pt states abdominal discomfort and distension along with generalized weakness. States very dark stools x 3 days.

## 2015-06-05 NOTE — ED Notes (Signed)
Pt made aware to return if symptoms worsen or if any life threatening symptoms occur.   

## 2015-06-10 ENCOUNTER — Ambulatory Visit (INDEPENDENT_AMBULATORY_CARE_PROVIDER_SITE_OTHER): Payer: Medicare Other | Admitting: Gastroenterology

## 2015-06-10 ENCOUNTER — Encounter: Payer: Self-pay | Admitting: Gastroenterology

## 2015-06-10 ENCOUNTER — Other Ambulatory Visit: Payer: Self-pay

## 2015-06-10 VITALS — BP 110/77 | HR 88 | Temp 97.4°F | Ht 64.0 in | Wt 152.4 lb

## 2015-06-10 DIAGNOSIS — D500 Iron deficiency anemia secondary to blood loss (chronic): Secondary | ICD-10-CM | POA: Diagnosis not present

## 2015-06-10 DIAGNOSIS — K219 Gastro-esophageal reflux disease without esophagitis: Secondary | ICD-10-CM | POA: Diagnosis not present

## 2015-06-10 DIAGNOSIS — R195 Other fecal abnormalities: Secondary | ICD-10-CM

## 2015-06-10 DIAGNOSIS — R194 Change in bowel habit: Secondary | ICD-10-CM

## 2015-06-10 DIAGNOSIS — R1013 Epigastric pain: Secondary | ICD-10-CM

## 2015-06-10 DIAGNOSIS — K921 Melena: Secondary | ICD-10-CM

## 2015-06-10 MED ORDER — LUBIPROSTONE 8 MCG PO CAPS: 8.0000 ug | ORAL_CAPSULE | Freq: Two times a day (BID) | ORAL | Status: DC

## 2015-06-10 MED ORDER — PEG 3350-KCL-NA BICARB-NACL 420 G PO SOLR: 4000.0000 mL | Freq: Once | ORAL | Status: DC

## 2015-06-10 NOTE — Patient Instructions (Signed)
1. Start amitiza 41mcg twice daily with food for constipation. Hold for diarrhea.  2. Colonoscopy and possible upper endoscopy with Dr. Oneida Alar to evaluate your abdominal pain and change in bowel habits, blood in stool. See separate instructions.

## 2015-06-10 NOTE — Assessment & Plan Note (Addendum)
74 year old female with recent onset abdominal bloating, change in bowel habits with constipation, heme positive stool with known history of small bowel AVMs/chronic IDA. Last colonoscopy in 2010. Given bowel habit change and heme positive stool, recommend updating colonoscopy at this time. Possible upper endoscopy depending on findings although she is having some epigastric tenderness, bloating, stomach distended with contrast but not necessarily pathological per radiologist.  I have discussed the risks, alternatives, benefits with regards to but not limited to the risk of reaction to medication, bleeding, infection, perforation and the patient is agreeable to proceed. Written consent to be obtained.  Start Amitiza 8 g twice a day for constipation.

## 2015-06-10 NOTE — Progress Notes (Signed)
Primary Care Physician:  Geroge Baseman  Primary Gastroenterologist:  Barney Drain, MD   Chief Complaint  Patient presents with  . Abdominal Pain    HPI:  Melody Mitchell is a 74 y.o. female here for further evaluation of abdominal pain. Last seen in 2012 for follow up IDA/small bowel AVMs. Last iron infusion 05/08/2015.  Recent ER visit last week for abdominal discomfort and distention for several days. Also reported passing dark stools, possibly bleeding. CT of the abdomen and pelvis with contrast showed a distended stomach after oral contrast administration although this might not necessarily be pathological. No other acute abnormalities noted within the abdomen or pelvis. Expected stool burden noted throughout the colon. She was seen positive. Hemoglobin stable. The labs as outlined below.  Feeling some better but feels bloated. Feels like not passing enough stool, incomplete evacuation. Passes very small amount every day. Has tried multiple laxatives to see if it would help. Really hasn't noticed any improvement. Currently using MOM as needed. Stools seem very dark. She sometimes takes iron pills. Denies any Pepto-Bismol. Denies fresh blood per rectum. Heartburn okay on medication. No dysphagia. No nausea or vomiting.      Current Outpatient Prescriptions  Medication Sig Dispense Refill  . acetaminophen (TYLENOL) 500 MG tablet Take 500 mg by mouth every 6 (six) hours as needed for mild pain.    Marland Kitchen amitriptyline (ELAVIL) 25 MG tablet Take 25 mg by mouth at bedtime as needed for sleep.     . celecoxib (CELEBREX) 100 MG capsule Take 100 mg by mouth daily.    . Cetirizine HCl 10 MG CAPS Take 1 capsule by mouth daily.    .       . escitalopram (LEXAPRO) 10 MG tablet Take 10 mg by mouth daily.    Marland Kitchen esomeprazole (NEXIUM) 40 MG capsule Take 40 mg by mouth daily before breakfast.      . fish oil-omega-3 fatty acids 1000 MG capsule Take 1 g by mouth daily.     . fluticasone (FLONASE) 50  MCG/ACT nasal spray Place 2 sprays into both nostrils daily.     . methylcellulose (ARTIFICIAL TEARS) 1 % ophthalmic solution Place 1 drop into both eyes 2 (two) times daily as needed (dry eyes).    . Multiple Vitamin (MULTIVITAMIN) capsule Take 1 capsule by mouth daily.      . ondansetron (ZOFRAN-ODT) 4 MG disintegrating tablet Take 4 mg by mouth every 8 (eight) hours as needed for nausea.    . potassium chloride SA (K-DUR,KLOR-CON) 20 MEQ tablet Take 20 mEq by mouth daily.    . Simethicone 125 MG CAPS Take 1 capsule (125 mg total) by mouth as needed (max 6 capsules per 24 hours). 28 each 0  . traMADol (ULTRAM) 50 MG tablet Take 50 mg by mouth every 6 (six) hours as needed. For pain    . triamcinolone ointment (KENALOG) 0.1 % Apply 1 application topically as needed (rash).     . zolpidem (AMBIEN) 10 MG tablet Take 10 mg by mouth at bedtime as needed for sleep.     .     3  .    0   No current facility-administered medications for this visit.    Allergies as of 06/10/2015 - Review Complete 06/10/2015  Allergen Reaction Noted  . Sulfonamide derivatives Hives and Itching   . Latex Hives, Itching, and Rash 07/01/2009    Past Medical History  Diagnosis Date  . High cholesterol   . Acid  reflux   . Hx: recurrent pneumonia   . IDA (iron deficiency anemia)     chronic iron infusions  . History of contact dermatitis   . Arthritis   . Bronchitis january 2014  . Hypertension   . Vertigo   . Hiatal hernia   . Reflux   . 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 Abdominal hysterectomy    . Appendectomy    . Cholecystectomy    . Bilateral breast biopsies  benign    . Colonoscopy w/ biopsies  6 2010    Dr. Oneida Alar: Polypoid appearing lesion of the ascending colon, 3 mm sessile rectal polyp, small internal hemorrhoids. Pathology revealed polypoid mucosa, no adenomatous changes  . Esophagogastroduodenoscopy   04/2009    Dr. Oneida Alar: Patent Schatzki ring dilated with advancing the scope, patchy erythema in the antrum, 2 small AVMs in the duodenal bulb, 2 additional AVMs noted in the second portion the duodenum, mild gastritis on pathology  . Double-balloon enteroscopy, antegrade  April 2011    Dr. 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    Family History  Problem Relation Age of Onset  . Heart failure Mother   . Diabetes Sister   . Colon cancer Neg Hx     History   Social History  . Marital Status: Divorced    Spouse Name: N/A  . Number of Children: 1  . Years of Education: N/A   Occupational History  . Not on file.   Social History Main Topics  . Smoking status: Former Research scientist (life sciences)  . Smokeless tobacco: Never Used  . Alcohol Use: No  . Drug Use: No  . Sexual Activity: Yes    Birth Control/ Protection: Post-menopausal, Surgical   Other Topics Concern  . Not on file   Social History Narrative      ROS:  General: Negative for anorexia, weight loss, fever, chills, fatigue, weakness. Eyes: Negative for vision changes.  ENT: Negative for hoarseness, difficulty swallowing , nasal congestion. CV: Negative for chest pain, angina, palpitations, dyspnea on exertion, peripheral edema.  Respiratory: Negative for dyspnea at rest, dyspnea on exertion, cough, sputum, wheezing.  GI: See history of present illness. GU:  Negative for dysuria, hematuria, urinary incontinence, urinary frequency, nocturnal urination.  MS: Negative for joint pain, low back pain.  Derm: Negative for rash or itching.  Neuro: Negative for weakness, abnormal sensation, seizure, frequent headaches, memory loss, confusion.  Psych: Negative for anxiety, depression, suicidal ideation, hallucinations.  Endo: Negative for unusual weight change.  Heme: Negative for bruising or bleeding. Allergy: Negative for rash or hives.    Physical Examination:  BP 110/77 mmHg  Pulse 88  Temp(Src)  97.4 F (36.3 C) (Oral)  Ht 5\' 4"  (1.626 m)  Wt 152 lb 6.4 oz (69.128 kg)  BMI 26.15 kg/m2   General: Well-nourished, well-developed in no acute distress.  Head: Normocephalic, atraumatic.   Eyes: Conjunctiva pink, no icterus. Mouth: Oropharyngeal mucosa moist and pink , no lesions erythema or exudate. Neck: Supple without thyromegaly, masses, or lymphadenopathy.  Lungs: Clear to auscultation bilaterally.  Heart: Regular rate and rhythm, no murmurs rubs or gallops.  Abdomen: Bowel sounds are normal, mild to moderate epigastric tenderness, nondistended, no hepatosplenomegaly or masses, no abdominal bruits or    hernia , no rebound or guarding.   Rectal: deferred Extremities: No lower extremity edema. No clubbing or deformities.  Neuro: Alert and oriented x 4 , grossly normal neurologically.  Skin: Warm and dry, no rash or jaundice.   Psych: Alert and cooperative, normal mood and affect.  Labs: Lab Results  Component Value Date   CREATININE 0.86 06/05/2015   BUN 12 06/05/2015   NA 139 06/05/2015   K 3.9 06/05/2015   CL 104 06/05/2015   CO2 28 06/05/2015   Lab Results  Component Value Date   ALT 16 06/05/2015   AST 23 06/05/2015   ALKPHOS 76 06/05/2015   BILITOT 0.2* 06/05/2015   Lab Results  Component Value Date   WBC 3.7* 06/05/2015   HGB 11.1* 06/05/2015   HCT 35.2* 06/05/2015   MCV 91.0 06/05/2015   PLT 302 06/05/2015     Imaging Studies: Ct Abdomen Pelvis W Contrast  06/05/2015   CLINICAL DATA:  Three day history of generalized abdominal pain and distension associated with dark stools and generalized weakness. Imaging earlier today questioned a sclerotic focus involving T11. Surgical history includes hysterectomy, appendectomy and cholecystectomy.  EXAM: CT ABDOMEN AND PELVIS WITH CONTRAST  TECHNIQUE: Multidetector CT imaging of the abdomen and pelvis was performed using the standard protocol following bolus administration of intravenous contrast.  CONTRAST:  180mL  OMNIPAQUE IOHEXOL 300 MG/ML IV. Oral contrast was also administered.  COMPARISON:  Acute abdomen series earlier same day. CT abdomen and pelvis 10/01/2008, 07/29/2008.  FINDINGS: Hepatobiliary: Liver normal in size and appearance. Gallbladder surgically absent. No unexpected biliary ductal dilation.  Spleen:  Normal in size and appearance.  Pancreas:  Normal in appearance.  No pancreatic ductal dilation.  Adrenal glands:  Normal in appearance.  Genitourinary: Both kidneys normal in size and appearance. No evidence of urinary tract calculi. Normal-appearing decompressed urinary bladder.  Uterus surgically absent. Left ovary present in the low left side of the pelvis containing a dystrophic calcification, unchanged in appearance since the prior examinations.  Gastrointestinal: Stomach moderately distended with oral contrast but normal in appearance. Normal appearing small bowel. Expected stool burden throughout normal appearing colon. Mobile cecum present in the right upper pelvis. Appendix surgically absent.  Ascites:  Absent.  Vascular: Moderate aortoiliofemoral atherosclerosis without aneurysm. Patent visceral arteries. Anatomic variant in that the right renal artery arises from the anterior aorta.  Lymphatic:  No pathologic lymphadenopathy in the abdomen or pelvis.  Other findings: Mild elevation of the left hemidiaphragm. Very small umbilical hernia containing fat.  Musculoskeletal: Marked disc space narrowing at T11-12 with sclerosis in the endplates adjacent to the disc, accounting for the findings on imaging earlier today. No evidence of osseous metastatic disease. Mild osseous demineralization.  Visualized lower thorax: Heart mildly enlarged, not significantly changed since the prior examinations. Visualized lung bases clear apart from minimal scarring in the right middle lobe and lingula.  IMPRESSION: 1. Distended stomach after oral contrast administration. This is not necessarily pathologic. Please  correlate clinically with symptoms of gastroparesis. 2. No acute abnormality involving the abdomen or pelvis. 3. Sclerosis in the endplates adjacent to the T11-12 disc secondary to severe degenerative disc disease accounting for the findings on the imaging study earlier today. No evidence of osseous metastatic disease.   Electronically Signed   By: Evangeline Dakin M.D.   On: 06/05/2015 15:55   Dg Abd Acute W/chest  06/05/2015   CLINICAL DATA:  Abdominal distention  EXAM: DG ABDOMEN ACUTE W/ 1V CHEST  COMPARISON:  Chest radiograph September 19, 2013; abdomen series Apr 04, 2012  FINDINGS: PA chest: There is slight scarring in  the left base. Lungs are otherwise clear. The heart size and pulmonary vascularity are normal. No adenopathy. Aorta is tortuous with atherosclerotic change, stable.  Supine and upright abdomen: There is moderate stool throughout the colon. There is no bowel dilatation or air-fluid level suggesting obstruction. No free air. There are surgical clips in the right upper quadrant region. There are phleboliths in the pelvis. There is sclerosis in the right side of the T11 vertebral body.  IMPRESSION: No obstruction or free air. Sclerosis in the right side of the T11 vertebral body, a finding not present previously. A sclerotic neoplastic focus must be of concern given this appearance. Thoracic spine radiographs with particular attention to the T11 region advised.  No lung edema or consolidation.  Slight scarring left lung base.   Electronically Signed   By: Lowella Grip III M.D.   On: 06/05/2015 14:22

## 2015-06-15 NOTE — Progress Notes (Signed)
CC'ED TO PCP 

## 2015-06-29 ENCOUNTER — Ambulatory Visit (HOSPITAL_COMMUNITY)
Admission: RE | Admit: 2015-06-29 | Discharge: 2015-06-29 | Disposition: A | Discharge status: Home / Self Care | Payer: Medicare Other | Source: Ambulatory Visit | Attending: Gastroenterology | Admitting: Gastroenterology

## 2015-06-29 ENCOUNTER — Encounter (HOSPITAL_COMMUNITY)
Admission: RE | Disposition: A | Discharge status: Home / Self Care | Payer: Self-pay | Source: Ambulatory Visit | Attending: Gastroenterology

## 2015-06-29 ENCOUNTER — Encounter (HOSPITAL_COMMUNITY): Payer: Self-pay | Admitting: *Deleted

## 2015-06-29 DIAGNOSIS — K635 Polyp of colon: Secondary | ICD-10-CM

## 2015-06-29 DIAGNOSIS — K648 Other hemorrhoids: Secondary | ICD-10-CM | POA: Diagnosis not present

## 2015-06-29 DIAGNOSIS — Z87891 Personal history of nicotine dependence: Secondary | ICD-10-CM | POA: Diagnosis not present

## 2015-06-29 DIAGNOSIS — R194 Change in bowel habit: Secondary | ICD-10-CM | POA: Diagnosis not present

## 2015-06-29 DIAGNOSIS — K2970 Gastritis, unspecified, without bleeding: Secondary | ICD-10-CM

## 2015-06-29 DIAGNOSIS — R195 Other fecal abnormalities: Secondary | ICD-10-CM

## 2015-06-29 DIAGNOSIS — K921 Melena: Secondary | ICD-10-CM | POA: Diagnosis present

## 2015-06-29 DIAGNOSIS — R1013 Epigastric pain: Secondary | ICD-10-CM | POA: Diagnosis not present

## 2015-06-29 DIAGNOSIS — Z79899 Other long term (current) drug therapy: Secondary | ICD-10-CM | POA: Diagnosis not present

## 2015-06-29 DIAGNOSIS — K219 Gastro-esophageal reflux disease without esophagitis: Secondary | ICD-10-CM | POA: Insufficient documentation

## 2015-06-29 DIAGNOSIS — K222 Esophageal obstruction: Secondary | ICD-10-CM | POA: Insufficient documentation

## 2015-06-29 DIAGNOSIS — Q438 Other specified congenital malformations of intestine: Secondary | ICD-10-CM | POA: Diagnosis not present

## 2015-06-29 DIAGNOSIS — E780 Pure hypercholesterolemia: Secondary | ICD-10-CM | POA: Insufficient documentation

## 2015-06-29 DIAGNOSIS — I10 Essential (primary) hypertension: Secondary | ICD-10-CM | POA: Diagnosis not present

## 2015-06-29 DIAGNOSIS — K625 Hemorrhage of anus and rectum: Secondary | ICD-10-CM | POA: Insufficient documentation

## 2015-06-29 DIAGNOSIS — K449 Diaphragmatic hernia without obstruction or gangrene: Secondary | ICD-10-CM | POA: Insufficient documentation

## 2015-06-29 DIAGNOSIS — R140 Abdominal distension (gaseous): Secondary | ICD-10-CM | POA: Diagnosis not present

## 2015-06-29 DIAGNOSIS — D125 Benign neoplasm of sigmoid colon: Secondary | ICD-10-CM | POA: Diagnosis not present

## 2015-06-29 HISTORY — PX: ESOPHAGOGASTRODUODENOSCOPY: SHX5428

## 2015-06-29 HISTORY — PX: COLONOSCOPY: SHX5424

## 2015-06-29 SURGERY — COLONOSCOPY
Anesthesia: Moderate Sedation

## 2015-06-29 MED ORDER — ATROPINE SULFATE 1 MG/ML IJ SOLN
INTRAMUSCULAR | Status: AC
  Filled 2015-06-29: qty 1

## 2015-06-29 MED ORDER — MIDAZOLAM HCL 5 MG/5ML IJ SOLN
INTRAMUSCULAR | Status: DC | PRN
Start: 2015-06-29 — End: 2015-06-29
  Administered 2015-06-29: 1 mg via INTRAVENOUS
  Administered 2015-06-29: 2 mg via INTRAVENOUS
  Administered 2015-06-29: 1 mg via INTRAVENOUS
  Administered 2015-06-29: 2 mg via INTRAVENOUS
  Administered 2015-06-29 (×2): 1 mg via INTRAVENOUS

## 2015-06-29 MED ORDER — LIDOCAINE VISCOUS 2 % MT SOLN
OROMUCOSAL | Status: AC
  Filled 2015-06-29: qty 15

## 2015-06-29 MED ORDER — SODIUM CHLORIDE 0.9 % IV SOLN: INTRAVENOUS | Status: DC

## 2015-06-29 MED ORDER — STERILE WATER FOR IRRIGATION IR SOLN
Status: DC | PRN
  Administered 2015-06-29: 13:00:00

## 2015-06-29 MED ORDER — MIDAZOLAM HCL 5 MG/5ML IJ SOLN
INTRAMUSCULAR | Status: AC
  Filled 2015-06-29: qty 10

## 2015-06-29 MED ORDER — MEPERIDINE HCL 100 MG/ML IJ SOLN
INTRAMUSCULAR | Status: AC
  Filled 2015-06-29: qty 2

## 2015-06-29 MED ORDER — MEPERIDINE HCL 100 MG/ML IJ SOLN
INTRAMUSCULAR | Status: DC | PRN
  Administered 2015-06-29 (×4): 25 mg via INTRAVENOUS

## 2015-06-29 MED ORDER — ATROPINE SULFATE 1 MG/ML IJ SOLN
INTRAMUSCULAR | Status: DC | PRN
  Administered 2015-06-29: 1 mg via INTRAVENOUS

## 2015-06-29 NOTE — OR Nursing (Signed)
Patient heart rate went down to 39, Atropine 1.0 ordered by Dr. Oneida Alar given.  Heart rate increased to 87

## 2015-06-29 NOTE — Op Note (Signed)
[facility] [address] [place], [address]   ENDOSCOPY PROCEDURE REPORT  PATIENT: Melody Mitchell, Melody Mitchell  MR#: [id number] BIRTHDATE: [birth year] , 46  yrs. old GENDER: female ENDOSCOPIST: Danie Binder, MD REFERRED [place]:PXTG [name], PA-C PROCEDURE DATE: 2015-07-29 PROCEDURE:   EGD w/ biopsy  INDICATIONS:change in bowel habits, and bloating: dyspepsia. MEDICATIONS: TCS+ Versed 1 mg IV and Demerol 25 mg IV TOPICAL ANESTHETIC:   Viscous Xylocaine ASA CLASS:  DESCRIPTION OF PROCEDURE:     Physical exam was performed.  Informed consent was obtained from the patient after explaining the benefits, risks, and alternatives to the procedure.  The patient was connected to the monitor and placed in the left lateral position.  Continuous oxygen was provided by nasal cannula and IV medicine administered through an indwelling cannula.  After administration of sedation, the patients esophagus was intubated and the EG-[id number] ([id number])  endoscope was advanced under direct visualization to the second portion of the duodenum.  The scope was removed slowly by carefully examining the color, texture, anatomy, and integrity of the mucosa on the way out.  The patient was recovered in endoscopy and discharged home in satisfactory condition.  Estimated blood loss is zero unless otherwise noted in this procedure report.    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 The stricture was easily traversable.   STOMACH: A small hiatal hernia was noted.   Mild non-erosive gastritis (inflammation) was found in the gastric antrum.  Multiple biopsies were performed using cold forceps.   DUODENUM: The duodenal mucosa showed no abnormalities in the bulb and 2nd part of the duodenum.  Cold forceps biopsies were taken in the bulb and second portion. COMPLICATIONS: There were no immediate complications.  ENDOSCOPIC IMPRESSION: 1.   Stricture at the gastroesophageal junction 2.    Small hiatal hernia 3.   MILD Non-erosive gastritis-NO OBVIOUS SOURCE FOR DYSPEPSIA IDENTIFIED  RECOMMENDATIONS: FOLLOW A HIGH FIBER/LOW FAT DIET.  AVOID ITEMS THAT CAUSE BLOATING.  AVOID ITEMS THAT TRIGGER GASTRITIS. [place]. USE PREPARATION H 2 TO 4 TIMES A DAY AS NEEDED TO RELIEVE RECTAL PRESSURE/ITCHING/BLEEDING. AWAIT BIOPSY RESULTS.  CONSIDER HBT FOR SIBO IF SYMPTOMS NOT IDEALLY CONTROLLED. FOLLOW UP IN 4 MOS. Next colonoscopy in 10-15 YEARS IF THE BENEFITS OUTWEIGH THE RISKS.  REPEAT EXAM: eSigned:  Danie Binder, MD 07/29/2015 2:53 PM  CPT CODES: ICD CODES:  The ICD and CPT codes recommended by this software are interpretations from the data that the clinical staff has captured with the software.  The verification of the translation of this report to the ICD and CPT codes and modifiers is the sole responsibility of the health care institution and practicing physician where this report was generated.  [place]. will not be held responsible for the validity of the ICD and CPT codes included on this report.  AMA assumes no liability for data contained or not contained herein. CPT is a Designer, television/film set of the [employer].

## 2015-06-29 NOTE — Op Note (Addendum)
[facility] [address] [place], [address]   COLONOSCOPY PROCEDURE REPORT  PATIENT: Melody Mitchell, Melody Mitchell  MR#: [id number] BIRTHDATE: 1941-11-13 , 56  yrs. old GENDER: female ENDOSCOPIST: Danie Binder, MD REFERRED WH:QPRF [name], PA-C PROCEDURE DATE:  07-28-15 PROCEDURE:   Colonoscopy with cold biopsy polypectomy and Colonoscopy with snare polypectomy INDICATIONS:change in bowel habits and hematochezia.  PT DENIED MELENA. PMHx: SB AVMs-LAST IVFE JUN 2016 MEDICATIONS: Versed 7 mg IV, Demerol 50 mg IV, and Atropine 1 mg IV   DESCRIPTION OF PROCEDURE:    Physical exam was performed.  Informed consent was obtained from the patient after explaining the benefits, risks, and alternatives to procedure.  The patient was connected to monitor and placed in left lateral position. Continuous oxygen was provided by nasal cannula and IV medicine administered through an indwelling cannula.  After administration of sedation and rectal exam, the patients rectum was intubated and the EC-[id number] ([id number])  colonoscope was advanced under direct visualization to the cecum.  The scope was removed slowly by carefully examining the color, texture, anatomy, and integrity mucosa on the way out.  The patient was recovered in endoscopy and discharged home in satisfactory condition. Estimated blood loss is zero unless otherwise noted in this procedure report.    COLON FINDINGS: The colon was redundant.  Manual abdominal counter-pressure was used to reach the cecum, A sessile polyp ranging from 4 to 66mm in size was found in the ascending colon.  A polypectomy was performed using snare cautery.  , Three sessile polyps ranging from 2 to 79mm in size were found in the sigmoid colon and transverse colon.  A polypectomy was performed with cold forceps.  , and Large internal hemorrhoids were found.  PREP QUALITY: good.  CECAL W/D TIME: 33       minutes COMPLICATIONS: HR DROPPED TO 40. ATROPINE 1  MG IV GIVEN. PT AGITATED DURING TCS IN SPITE OF ADEQUATE SEDATION. NO IMMEDIATE RECALL.  ENDOSCOPIC IMPRESSION: 1.   The LEFT colon IS redundant 2.   FOUR COLON POLYPS REMOVED . NO SOURCE FOR CHANGE IN BOWEL HABITS IDENTIFIED 3.   RECTAL BLEEDING DUE TO Large internal hemorrhoids  RECOMMENDATIONS: FOLLOW A HIGH FIBER/LOW FAT DIET.  AVOID ITEMS THAT CAUSE BLOATING.  AVOID ITEMS THAT TRIGGER GASTRITIS. [place]. USE PREPARATION H 2 TO 4 TIMES A DAY AS NEEDED TO RELIEVE RECTAL PRESSURE/ITCHING/BLEEDING. AWAIT BIOPSY RESULTS. FOLLOW UP IN 4 MOS. Next colonoscopy in 10-15 YEARS IF THE BENEFITS OUTWEIGH THE RISKS.  eSigned:  Danie Binder, MD 28-Jul-2015 4:14 PM Revised: 07/28/15 4:14 PM   CPT CODES: ICD CODES:  The ICD and CPT codes recommended by this software are interpretations from the data that the clinical staff has captured with the software.  The verification of the translation of this report to the ICD and CPT codes and modifiers is the sole responsibility of the health care institution and practicing physician where this report was generated.  [place]. will not be held responsible for the validity of the ICD and CPT codes included on this report.  AMA assumes no liability for data contained or not contained herein. CPT is a Designer, television/film set of the [employer].

## 2015-06-29 NOTE — H&P (Addendum)
Primary Care Physician:  Geroge Baseman Primary Gastroenterologist:  Dr. Oneida Alar  Pre-Procedure History & Physical: HPI:  Melody Mitchell is a 74 y.o. female here for CHANGE IN BOWEL HABITS/dyspepia.  Past Medical History  Diagnosis Date  . High cholesterol   . Acid reflux   . Hx: recurrent pneumonia   . IDA (iron deficiency anemia)     chronic iron infusions  . History of contact dermatitis   . Arthritis   . Bronchitis january 2014  . Hypertension   . Vertigo   . Hiatal hernia   . Reflux   . 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 Abdominal hysterectomy    . Appendectomy    . Cholecystectomy    . Bilateral breast biopsies  benign    . Colonoscopy w/ biopsies  6 2010    Dr. Oneida Alar: Polypoid appearing lesion of the ascending colon, 3 mm sessile rectal polyp, small internal hemorrhoids. Pathology revealed polypoid mucosa, no adenomatous changes  . Esophagogastroduodenoscopy  04/2009    Dr. Oneida Alar: Patent Schatzki ring dilated with advancing the scope, patchy erythema in the antrum, 2 small AVMs in the duodenal bulb, 2 additional AVMs noted in the second portion the duodenum, mild gastritis on pathology  . Double-balloon enteroscopy, antegrade  April 2011    Dr. 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    Prior to Admission medications   Medication Sig Start Date End Date Taking? Authorizing Provider  acetaminophen (TYLENOL) 500 MG tablet Take 500 mg by mouth every 6 (six) hours as needed for mild pain.   Yes Historical Provider, MD  amitriptyline (ELAVIL) 25 MG tablet Take 25 mg by mouth at bedtime as needed for sleep.    Yes Historical Provider, MD  celecoxib (CELEBREX) 100 MG capsule Take 100 mg by mouth daily.   Yes Historical Provider, MD  Cetirizine HCl 10 MG CAPS Take 1 capsule by mouth daily.   Yes Historical Provider, MD  docusate sodium (COLACE) 100 MG  capsule Take 100 mg by mouth daily as needed for mild constipation or moderate constipation.    Yes Historical Provider, MD  escitalopram (LEXAPRO) 10 MG tablet Take 10 mg by mouth daily. 10/25/13  Yes Historical Provider, MD  esomeprazole (NEXIUM) 40 MG capsule Take 40 mg by mouth daily before breakfast.     Yes Historical Provider, MD  fish oil-omega-3 fatty acids 1000 MG capsule Take 1 g by mouth daily.    Yes Historical Provider, MD  fluticasone (FLONASE) 50 MCG/ACT nasal spray Place 2 sprays into both nostrils daily.  04/11/13  Yes Historical Provider, MD  lubiprostone (AMITIZA) 8 MCG capsule Take 1 capsule (8 mcg total) by mouth 2 (two) times daily with a meal. 06/10/15  Yes Mahala Menghini, PA-C  methylcellulose (ARTIFICIAL TEARS) 1 % ophthalmic solution Place 1 drop into both eyes 2 (two) times daily as needed (dry eyes).   Yes Historical Provider, MD  Multiple Vitamin (MULTIVITAMIN) capsule Take 1 capsule by mouth daily.     Yes Historical Provider, MD  ondansetron (ZOFRAN-ODT) 4 MG disintegrating tablet Take 4 mg by mouth every 8 (eight) hours as needed for nausea. 04/23/14  Yes Fredia Sorrow, MD  polyethylene glycol-electrolytes (NULYTELY/GOLYTELY) 420 G solution Take 4,000 mLs by mouth once. 06/10/15  Yes Mahala Menghini, PA-C  potassium chloride SA (K-DUR,KLOR-CON) 20 MEQ tablet Take 20 mEq by mouth  daily.   Yes Historical Provider, MD  Simethicone 125 MG CAPS Take 1 capsule (125 mg total) by mouth as needed (max 6 capsules per 24 hours). 06/05/15  Yes Orpah Greek, MD  traMADol (ULTRAM) 50 MG tablet Take 50 mg by mouth every 6 (six) hours as needed. For pain   Yes Historical Provider, MD  triamcinolone ointment (KENALOG) 0.1 % Apply 1 application topically as needed (rash).  02/26/14  Yes Historical Provider, MD  zolpidem (AMBIEN) 10 MG tablet Take 10 mg by mouth at bedtime as needed for sleep.  02/26/14  Yes Historical Provider, MD    Allergies as of 06/10/2015 - Review Complete  06/10/2015  Allergen Reaction Noted  . Sulfonamide derivatives Hives and Itching   . Latex Hives, Itching, and Rash 07/01/2009    Family History  Problem Relation Age of Onset  . Heart failure Mother   . Diabetes Sister   . Colon cancer Neg Hx     Social History   Social History  . Marital Status: Divorced    Spouse Name: N/A  . Number of Children: 1  . Years of Education: N/A   Occupational History  . Not on file.   Social History Main Topics  . Smoking status: Former Smoker -- 1.00 packs/day for 25 years    Types: Cigarettes    Quit date: 09/14/2007  . Smokeless tobacco: Never Used  . Alcohol Use: No  . Drug Use: No  . Sexual Activity: Yes    Birth Control/ Protection: Post-menopausal, Surgical   Other Topics Concern  . Not on file   Social History Narrative    Review of Systems: See HPI, otherwise negative ROS   Physical Exam: BP 144/93 mmHg  Pulse 64  Temp(Src) 98.9 F (37.2 C) (Oral)  Resp 14  SpO2 99% General:   Alert,  pleasant and cooperative in NAD Head:  Normocephalic and atraumatic. Neck:  Supple; Lungs:  Clear throughout to auscultation.    Heart:  Regular rate and rhythm. Abdomen:  Soft, nontender and nondistended. Normal bowel sounds, without guarding, and without rebound.   Neurologic:  Alert and  oriented x4;  grossly normal neurologically.  Impression/Plan:   CHANGE IN BOWEL HABITS/dyspepia  Plan: 1. TCS/ EGD TODAY

## 2015-06-29 NOTE — Discharge Instructions (Signed)
NO SOURCE FOR YOUR CHANGE IN BOWEL HABITS OR BLOATING WAS IDENTIFIED. You had 4 polyps removed. Your RECTAL BLEEDING IS MOST LIKELY DUE TO LARGE internal hemorrhoids. 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 I biopsied your stomach AND SMALL BOWEL.   DRINK WATER TO KEEP YOUR URINE LIGHT YELLOW.  FOLLOW A HIGH FIBER/LOW FAT DIET. AVOID ITEMS THAT CAUSE BLOATING. SEE INFO BELOW.  TO REDUCE BLOATING, USE LACTASE 3 PILLS WHEN CONSUMING DAIRY PRODUCTS.  AVOID ITEMS THAT TRIGGER GASTRITIS. SEE INFO BELOW.  CONTINUE AMITIZA OR MILK OF MAGNESIA AND NEXIUM.  USE PREPARATION H 2 TO 4 TIMES A DAY AS NEEDED TO RELIEVE RECTAL PRESSURE/ITCHING/BLEEDING.  YOUR BIOPSY RESULTS WILL BE AVAILABLE IN MY CHART AFTER AUG 17 AND MY OFFICE WILL CONTACT YOU IN 10-14 DAYS WITH YOUR RESULTS.   FOLLOW UP IN 4 MOS.   Next colonoscopy in 10-15 YEARS IF THE BENEFITS OUTWEIGH THE RISKS.   ENDOSCOPY Care After Read the instructions outlined below and refer to this sheet in the next week. These discharge instructions provide you with general information on caring for yourself after you leave the hospital. While your treatment has been planned according to the most current medical practices available, unavoidable complications occasionally occur. If you have any problems or questions after discharge, call DR. Tyese Finken, [phone].  ACTIVITY  You may resume your regular activity, but move at a slower pace for the next 24 hours.   Take frequent rest periods for the next 24 hours.   Walking will help get rid of the air and reduce the bloated feeling in your belly (abdomen).   No driving for 24 hours (because of the medicine (anesthesia) used during the test).   You may shower.   Do not sign any important legal documents or operate any machinery for 24 hours (because of the anesthesia used during the test).    NUTRITION  Drink plenty of fluids.   You may resume your normal diet as instructed by your  doctor.   Begin with a light meal and progress to your normal diet. Heavy or fried foods are harder to digest and may make you feel sick to your stomach (nauseated).   Avoid alcoholic beverages for 24 hours or as instructed.    MEDICATIONS  You may resume your normal medications.   WHAT YOU CAN EXPECT TODAY  Some feelings of bloating in the abdomen.   Passage of more gas than usual.   Spotting of blood in your stool or on the toilet paper  .  IF YOU HAD POLYPS REMOVED DURING THE ENDOSCOPY:  Eat a soft diet IF YOU HAVE NAUSEA, BLOATING, ABDOMINAL PAIN, OR VOMITING.    FINDING OUT THE RESULTS OF YOUR TEST Not all test results are available during your visit. DR. Oneida Alar WILL CALL YOU WITHIN 14 DAYS OF YOUR PROCEDUE WITH YOUR RESULTS. Do not assume everything is normal if you have not heard from DR. Imari Reen IN TWO WEEKS, CALL HER OFFICE AT [phone].  SEEK IMMEDIATE MEDICAL ATTENTION AND CALL THE OFFICE: [phone] IF:  You have more than a spotting of blood in your stool.   Your belly is swollen (abdominal distention).   You are nauseated or vomiting.   You have a temperature over 101F.   You have abdominal pain or discomfort that is severe or gets worse throughout the day.   Gastritis  Gastritis is an inflammation (the body's way of reacting to injury and/or infection) of the stomach. It is often caused  by viral or bacterial (germ) infections. It can also be caused BY ALCOHOL, ASPIRIN, BC/GOODY POWDER'S, (IBUPROFEN) MOTRIN, OR ALEVE (NAPROXEN), chemicals (including alcohol), SPICY FOODS, and medications. This illness may be associated with generalized malaise (feeling tired, not well), UPPER ABDOMINAL STOMACH cramps, and fever. One common bacterial cause of gastritis is an organism known as H. Pylori. This can be treated with antibiotics.    High-Fiber Diet A high-fiber diet changes your normal diet to include more whole grains, legumes, fruits, and vegetables.  Changes in the diet involve replacing refined carbohydrates with unrefined foods. The calorie level of the diet is essentially unchanged. The Dietary Reference Intake (recommended amount) for adult males is 38 grams per day. For adult females, it is 25 grams per day. Pregnant and lactating women should consume 28 grams of fiber per day. Fiber is the intact part of a plant that is not broken down during digestion. Functional fiber is fiber that has been isolated from the plant to provide a beneficial effect in the body. PURPOSE  Increase stool bulk.   Ease and regulate bowel movements.   Lower cholesterol.  REDUCE RISK OF COLON CANCER  INDICATIONS THAT YOU NEED MORE FIBER  Constipation and hemorrhoids.   Uncomplicated diverticulosis (intestine condition) and irritable bowel syndrome.   Weight management.   As a protective measure against hardening of the arteries (atherosclerosis), diabetes, and cancer.   GUIDELINES FOR INCREASING FIBER IN THE DIET  Start adding fiber to the diet slowly. A gradual increase of about 5 more grams (2 slices of whole-wheat bread, 2 servings of most fruits or vegetables, or 1 bowl of high-fiber cereal) per day is best. Too rapid an increase in fiber may result in constipation, flatulence, and bloating.   Drink enough water and fluids to keep your urine clear or pale yellow. Water, juice, or caffeine-free drinks are recommended. Not drinking enough fluid may cause constipation.   Eat a variety of high-fiber foods rather than one type of fiber.   Try to increase your intake of fiber through using high-fiber foods rather than fiber pills or supplements that contain small amounts of fiber.   The goal is to change the types of food eaten. Do not supplement your present diet with high-fiber foods, but replace foods in your present diet.   INCLUDE A VARIETY OF FIBER SOURCES  Replace refined and processed grains with whole grains, canned fruits with fresh  fruits, and incorporate other fiber sources. White rice, white breads, and most bakery goods contain little or no fiber.   Brown whole-grain rice, buckwheat oats, and many fruits and vegetables are all good sources of fiber. These include: broccoli, Brussels sprouts, cabbage, cauliflower, beets, sweet potatoes, white potatoes (skin on), carrots, tomatoes, eggplant, squash, berries, fresh fruits, and dried fruits.   Cereals appear to be the richest source of fiber. Cereal fiber is found in whole grains and bran. Bran is the fiber-rich outer coat of cereal grain, which is largely removed in refining. In whole-grain cereals, the bran remains. In breakfast cereals, the largest amount of fiber is found in those with "bran" in their names. The fiber content is sometimes indicated on the label.   You may need to include additional fruits and vegetables each day.   In baking, for 1 cup white flour, you may use the following substitutions:   1 cup whole-wheat flour minus 2 tablespoons.   1/2 cup white flour plus 1/2 cup whole-wheat flour.   Low-Fat Diet BREADS, CEREALS, PASTA, RICE,  DRIED PEAS, AND BEANS These products are high in carbohydrates and most are low in fat. Therefore, they can be increased in the diet as substitutes for fatty foods. They too, however, contain calories and should not be eaten in excess. Cereals can be eaten for snacks as well as for breakfast.  Include foods that contain fiber (fruits, vegetables, whole grains, and legumes). Research shows that fiber may lower blood cholesterol levels, especially the water-soluble fiber found in fruits, vegetables, oat products, and legumes. FRUITS AND VEGETABLES It is good to eat fruits and vegetables. Besides being sources of fiber, both are rich in vitamins and some minerals. They help you get the daily allowances of these nutrients. Fruits and vegetables can be used for snacks and desserts. MEATS Limit lean meat, chicken, [place], and fish  to no more than 6 ounces per day. Beef, Pork, and Lamb Use lean cuts of beef, pork, and lamb. Lean cuts include:  Extra-lean ground beef.  Arm roast.  Sirloin tip.  Center-cut ham.  Round steak.  Loin chops.  Rump roast.  Tenderloin.  Trim all fat off the outside of meats before cooking. It is not necessary to severely decrease the intake of red meat, but lean choices should be made. Lean meat is rich in protein and contains a highly absorbable form of iron. Premenopausal women, in particular, should avoid reducing lean red meat because this could increase the risk for low red blood cells (iron-deficiency anemia). The organ meats, such as liver, sweetbreads, kidneys, and brain are very rich in cholesterol. They should be limited. Chicken and [place] These are good sources of protein. The fat of poultry can be reduced by removing the skin and underlying fat layers before cooking. Chicken and [place] can be substituted for lean red meat in the diet. Poultry should not be fried or covered with high-fat sauces. Fish and Shellfish Fish is a good source of protein. Shellfish contain cholesterol, but they usually are low in saturated fatty acids. The preparation of fish is important. Like chicken and [place], they should not be fried or covered with high-fat sauces. EGGS Egg whites contain no fat or cholesterol. They can be eaten often. Try 1 to 2 egg whites instead of whole eggs in recipes or use egg substitutes that do not contain yolk. MILK AND DAIRY PRODUCTS Use skim or 1% milk instead of 2% or whole milk. Decrease whole milk, natural, and processed cheeses. Use nonfat or low-fat (2%) cottage cheese or low-fat cheeses made from vegetable oils. Choose nonfat or low-fat (1 to 2%) yogurt. Experiment with evaporated skim milk in recipes that call for heavy cream. Substitute low-fat yogurt or low-fat cottage cheese for sour cream in dips and salad dressings. Have at least 2 servings of low-fat dairy  products, such as 2 glasses of skim (or 1%) milk each day to help get your daily calcium intake.  FATS AND OILS Reduce the total intake of fats, especially saturated fat. Butterfat, lard, and beef fats are high in saturated fat and cholesterol. These should be avoided as much as possible. Vegetable fats do not contain cholesterol, but certain vegetable fats, such as coconut oil, palm oil, and palm kernel oil are very high in saturated fats. These should be limited. These fats are often used in bakery goods, processed foods, popcorn, oils, and nondairy creamers. Vegetable shortenings and some peanut butters contain hydrogenated oils, which are also saturated fats. Read the labels on these foods and check for saturated vegetable oils. Unsaturated vegetable  oils and fats do not raise blood cholesterol. However, they should be limited because they are fats and are high in calories. Total fat should still be limited to 30% of your daily caloric intake. Desirable liquid vegetable oils are corn oil, cottonseed oil, olive oil, canola oil, safflower oil, soybean oil, and sunflower oil. Peanut oil is not as good, but small amounts are acceptable. Buy a heart-healthy tub margarine that has no partially hydrogenated oils in the ingredients. Mayonnaise and salad dressings often are made from unsaturated fats, but they should also be limited because of their high calorie and fat content. Seeds, nuts, peanut butter, olives, and avocados are high in fat, but the fat is mainly the unsaturated type. These foods should be limited mainly to avoid excess calories and fat. OTHER EATING TIPS Snacks  Most sweets should be limited as snacks. They tend to be rich in calories and fats, and their caloric content outweighs their nutritional value. Some good choices in snacks are graham crackers, melba toast, soda crackers, bagels (no egg), English muffins, fruits, and vegetables. These snacks are preferable to snack crackers, [place]  fries, and chips. Popcorn should be air-popped or cooked in small amounts of liquid vegetable oil. Desserts Eat fruit, low-fat yogurt, and fruit ices. AVOID pastries, cake, and cookies. Sherbet, angel food cake, gelatin dessert, frozen low-fat yogurt, or other frozen products that do not contain saturated fat (pure fruit juice bars, frozen ice pops) are also acceptable.  COOKING METHODS Choose those methods that use little or no fat. They include: Poaching.  Braising.  Steaming.  Grilling.  Baking.  Stir-frying.  Broiling.  Microwaving.  Foods can be cooked in a nonstick pan without added fat, or use a nonfat cooking spray in regular cookware. Limit fried foods and avoid frying in saturated fat. Add moisture to lean meats by using water, broth, cooking wines, and other nonfat or low-fat sauces along with the cooking methods mentioned above. Soups and stews should be chilled after cooking. The fat that forms on top after a few hours in the refrigerator should be skimmed off. When preparing meals, avoid using excess salt. Salt can contribute to raising blood pressure in some people. EATING AWAY FROM HOME Order entres, potatoes, and vegetables without sauces or butter. When meat exceeds the size of a deck of cards (3 to 4 ounces), the rest can be taken home for another meal. Choose vegetable or fruit salads and ask for low-calorie salad dressings to be served on the side. Use dressings sparingly. Limit high-fat toppings, such as bacon, crumbled eggs, cheese, sunflower seeds, and olives. Ask for heart-healthy tub margarine instead of butter.  Polyps, Colon  A polyp is extra tissue that grows inside your body. Colon polyps grow in the large intestine. The large intestine, also called the colon, is part of your digestive system. It is a long, hollow tube at the end of your digestive tract where your body makes and stores stool. Most polyps are not dangerous. They are benign. This means they are not  cancerous. But over time, some types of polyps can turn into cancer. Polyps that are smaller than a pea are usually not harmful. But larger polyps could someday become or may already be cancerous. To be safe, doctors remove all polyps and test them.   PREVENTION There is not one sure way to prevent polyps. You might be able to lower your risk of getting them if you:  Eat more fruits and vegetables and less fatty food.  Do not smoke.   Avoid alcohol.   Exercise every day.   Lose weight if you are overweight.   Eating more calcium and folate can also lower your risk of getting polyps. Some foods that are rich in calcium are milk, cheese, and broccoli. Some foods that are rich in folate are chickpeas, kidney beans, and spinach.    Hemorrhoids Hemorrhoids are dilated (enlarged) veins around the rectum. Sometimes clots will form in the veins. This makes them swollen and painful. These are called thrombosed hemorrhoids. Causes of hemorrhoids include:  Constipation.   Straining to have a bowel movement.   HEAVY LIFTING  HOME CARE INSTRUCTIONS  Eat a well balanced diet and drink 6 to 8 glasses of water every day to avoid constipation. You may also use a bulk laxative.   Avoid straining to have bowel movements.   Keep anal area dry and clean.   Do not use a donut shaped pillow or sit on the toilet for long periods. This increases blood pooling and pain.   Move your bowels when your body has the urge; this will require less straining and will decrease pain and pressure.

## 2015-07-01 ENCOUNTER — Telehealth: Payer: Self-pay | Admitting: Gastroenterology

## 2015-07-01 NOTE — Telephone Encounter (Signed)
Please call pt. 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 HER stomach Bx shows gastritis. HER BLOATING IS MOST LIKELY DUE TO CONSTIPATION AND MILD LACTOSE INTOLERANCE.   DRINK WATER TO KEEP YOUR URINE LIGHT YELLOW.  FOLLOW A HIGH FIBER/LOW FAT DIET. AVOID ITEMS THAT CAUSE BLOATING.   TO REDUCE BLOATING, USE LACTASE 3 PILLS WHEN CONSUMING DAIRY PRODUCTS.  AVOID ITEMS THAT TRIGGER GASTRITIS. S  CONTINUE AMITIZA OR MILK OF MAGNESIA AND [place].  USE PREPARATION H 2 TO 4 TIMES A DAY AS NEEDED TO RELIEVE RECTAL PRESSURE/ITCHING/BLEEDING.  PLEASE CALL IN TWO MONTHS IF YOUR CONSTIPATION AND BLOATING ARE NOT BETTER.  FOLLOW UP IN 4 MOS E30 BLOATING/CONSTIPATION.   Next colonoscopy in 10-15 YEARS IF THE BENEFITS OUTWEIGH THE RISKS.

## 2015-07-02 ENCOUNTER — Encounter (HOSPITAL_COMMUNITY): Payer: Self-pay | Admitting: Gastroenterology

## 2015-07-02 NOTE — Telephone Encounter (Signed)
APPT MADE AND ON RECALL  °

## 2015-07-07 NOTE — Telephone Encounter (Signed)
Pt is aware of results. 

## 2015-08-05 ENCOUNTER — Encounter (HOSPITAL_COMMUNITY): Payer: Medicare Other | Attending: Hematology & Oncology

## 2015-08-05 ENCOUNTER — Other Ambulatory Visit (HOSPITAL_COMMUNITY): Payer: Self-pay | Admitting: Oncology

## 2015-08-05 DIAGNOSIS — D500 Iron deficiency anemia secondary to blood loss (chronic): Secondary | ICD-10-CM

## 2015-08-05 DIAGNOSIS — D509 Iron deficiency anemia, unspecified: Secondary | ICD-10-CM

## 2015-08-05 LAB — CBC
HCT: 33.2 % — ABNORMAL LOW (ref 36.0–46.0)
Hemoglobin: 10.2 g/dL — ABNORMAL LOW (ref 12.0–15.0)
MCH: 27.7 pg (ref 26.0–34.0)
MCHC: 30.7 g/dL (ref 30.0–36.0)
MCV: 90.2 fL (ref 78.0–100.0)
Platelets: 337 10*3/uL (ref 150–400)
RBC: 3.68 MIL/uL — ABNORMAL LOW (ref 3.87–5.11)
RDW: 14.7 % (ref 11.5–15.5)
WBC: 4.7 10*3/uL (ref 4.0–10.5)

## 2015-08-05 LAB — FERRITIN: FERRITIN: 23 ng/mL (ref 11–307)

## 2015-08-06 NOTE — Progress Notes (Signed)
Labs drawn

## 2015-08-07 ENCOUNTER — Encounter (HOSPITAL_BASED_OUTPATIENT_CLINIC_OR_DEPARTMENT_OTHER): Payer: Medicare Other

## 2015-08-07 VITALS — BP 125/87 | HR 86 | Temp 98.0°F | Resp 18

## 2015-08-07 DIAGNOSIS — D500 Iron deficiency anemia secondary to blood loss (chronic): Secondary | ICD-10-CM | POA: Diagnosis not present

## 2015-08-07 MED ORDER — FERUMOXYTOL INJECTION 510 MG/17 ML
510.0000 mg | Freq: Once | INTRAVENOUS | Status: AC
  Administered 2015-08-07: 510 mg via INTRAVENOUS
  Filled 2015-08-07: qty 17

## 2015-08-07 MED ORDER — SODIUM CHLORIDE 0.9 % IV SOLN
INTRAVENOUS | Status: DC
  Administered 2015-08-07: 13:00:00 via INTRAVENOUS

## 2015-08-07 NOTE — Progress Notes (Signed)
Epimenio Foot Tolerated iron infusion well today Discharged ambulatory

## 2015-08-07 NOTE — Patient Instructions (Signed)
[place] at [facility] Discharge Instructions  RECOMMENDATIONS MADE BY THE CONSULTANT AND ANY TEST RESULTS WILL BE SENT TO YOUR REFERRING PHYSICIAN.  Today you received [name] 510 mg iron infusion as ordered. Report any issues/concerns as needed. Return as scheduled.  Thank you for choosing [place] at [facility] to provide your oncology and hematology care.  To afford each patient quality time with our provider, please arrive at least 15 minutes before your scheduled appointment time.    You need to re-schedule your appointment should you arrive 10 or more minutes late.  We strive to give you quality time with our providers, and arriving late affects you and other patients whose appointments are after yours.  Also, if you no show three or more times for appointments you may be dismissed from the clinic at the providers discretion.     Again, thank you for choosing [facility].  Our hope is that these requests will decrease the amount of time that you wait before being seen by our physicians.       _____________________________________________________________  Should you have questions after your visit to [facility], please contact our office at [phone] between the hours of 8:30 a.m. and 4:30 p.m.  Voicemails left after 4:30 p.m. will not be returned until the following business day.  For prescription refill requests, have your pharmacy contact our office.

## 2015-08-11 ENCOUNTER — Ambulatory Visit (HOSPITAL_COMMUNITY): Payer: Medicare Other

## 2015-10-29 ENCOUNTER — Ambulatory Visit: Payer: Medicare Other | Admitting: Nurse Practitioner

## 2015-11-04 ENCOUNTER — Ambulatory Visit (HOSPITAL_COMMUNITY): Payer: Medicare Other | Admitting: Oncology

## 2015-11-04 ENCOUNTER — Encounter (HOSPITAL_COMMUNITY): Payer: Medicare Other

## 2015-11-17 ENCOUNTER — Other Ambulatory Visit (HOSPITAL_COMMUNITY): Payer: Self-pay

## 2015-11-17 DIAGNOSIS — D500 Iron deficiency anemia secondary to blood loss (chronic): Secondary | ICD-10-CM

## 2015-11-18 ENCOUNTER — Ambulatory Visit (INDEPENDENT_AMBULATORY_CARE_PROVIDER_SITE_OTHER): Payer: Medicare Other | Admitting: Nurse Practitioner

## 2015-11-18 ENCOUNTER — Encounter: Payer: Self-pay | Admitting: Nurse Practitioner

## 2015-11-18 VITALS — BP 127/85 | HR 85 | Temp 98.8°F | Ht 64.0 in | Wt 154.4 lb

## 2015-11-18 DIAGNOSIS — R195 Other fecal abnormalities: Secondary | ICD-10-CM | POA: Diagnosis not present

## 2015-11-18 DIAGNOSIS — D500 Iron deficiency anemia secondary to blood loss (chronic): Secondary | ICD-10-CM

## 2015-11-18 DIAGNOSIS — K219 Gastro-esophageal reflux disease without esophagitis: Secondary | ICD-10-CM | POA: Diagnosis not present

## 2015-11-18 NOTE — Assessment & Plan Note (Signed)
Continued iron deficiency anemia currently managed by hematology. Last hemoglobin 10.2 in September this year. Hasn't appointment coming up within the month. 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 No noted gross hematochezia or melena since colonoscopy. Return for follow-up as needed.

## 2015-11-18 NOTE — Patient Instructions (Addendum)
1. Continue taking your medications as you have been. 2. If you need refills, call us or have your pharmacy call us. 3. Return for follow-up as needed for any stomach or colon problems. 4. Continue to see hematology for anemia.

## 2015-11-18 NOTE — Progress Notes (Signed)
CC'ED TO PCP 

## 2015-11-18 NOTE — Progress Notes (Signed)
Referring Provider: [name], Neita Goodnight, PA-C Primary Care Physician:  Shade Flood, MD Primary GI:  Dr. Oneida Alar  Chief Complaint  Patient presents with  . Follow-up    HPI:   75 year old female presents for follow-up on colonoscopy, endoscopy, and iron deficiency anemia. Continues to see hematology oncology for iron deficiency anemia with IV ferriheme as needed due to chronic blood loss from noted AVMs. Patient with change in bowel habits and hematochezia with colonoscopy completed 06/29/2015. Findings included redundant colon, 4 colon polyps removed, rectal bleeding likely due to large internal hemorrhoids, no source for change in bowel habits identified. Endoscopy completed same day which found small hiatal hernia, mild nonerosive gastritis without obvious source for dyspepsia identified. Recommendations include high-fiber/low-fat diet, avoid trigger foods, continue Amitiza and Nexium, Preparation H as needed for hemorrhoid symptoms, next colonoscopy in 10-15 years if the benefits outweigh the risks. Surgical pathology found colon polyps to be 2 tubular adenomas and one benign polypoid mucosa. Duodenum biopsy benign small bowel mucosa, stomach biopsy chronic inactive gastritis.  Today she states she has had no further bleeding, no hematochezia or melena. Denies abdominal pain, N/V. GERD symptoms doing "really good" on PPI. Denies fever, chills, change in bowel habits. Has a BM every 1-2 days, consistent with [place] 4. No straining. Denies chest pain, dyspnea, dizziness, lightheadedness, syncope, near syncope. Denies any other upper or lower GI symptoms.  Past Medical History  Diagnosis Date  . High cholesterol   . Acid reflux   . Hx: recurrent pneumonia   . IDA (iron deficiency anemia)     chronic iron infusions  . History of contact dermatitis   . Arthritis   . Bronchitis january 2014  . Hypertension   . Vertigo   . Hiatal hernia   . Reflux   . 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 Abdominal hysterectomy    . Appendectomy    . Cholecystectomy    . Bilateral breast biopsies  benign    . Colonoscopy w/ biopsies  6 2010    Dr. Oneida Alar: Polypoid appearing lesion of the ascending colon, 3 mm sessile rectal polyp, small internal hemorrhoids. Pathology revealed polypoid mucosa, no adenomatous changes  . Esophagogastroduodenoscopy  04/2009    Dr. Oneida Alar: Patent Schatzki ring dilated with advancing the scope, patchy erythema in the antrum, 2 small AVMs in the duodenal bulb, 2 additional AVMs noted in the second portion the duodenum, mild gastritis on pathology  . Double-balloon enteroscopy, antegrade  April 2011    Dr. 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  . Colonoscopy N/A 06/29/2015    SLF: The left colon is redundant 2. four colon polyps removed. No source  for change in bowel habits identified 3. Rectal bleeding due to large internal hemorrhoids   . Esophagogastroduodenoscopy N/A 06/29/2015    SLF: 1. 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 small hiatal hernia 3. Mild non-erosive gastririts- NO obvious source for dyspepsia identified.     Current Outpatient Prescriptions  Medication Sig Dispense Refill  . acetaminophen (TYLENOL) 500 MG tablet Take 500 mg by mouth every 6 (six) hours as needed for mild pain.    Marland Kitchen amitriptyline (ELAVIL) 25 MG tablet Take 25 mg by mouth at bedtime as needed for sleep.     . celecoxib (CELEBREX) 100 MG capsule Take 100 mg by mouth daily.    Marland Kitchen  Cetirizine HCl 10 MG CAPS Take 1 capsule by mouth daily.    Marland Kitchen docusate sodium (COLACE) 100 MG capsule Take 100 mg by mouth daily as needed for mild constipation or moderate constipation.     Marland Kitchen escitalopram (LEXAPRO) 10 MG tablet Take 10 mg by mouth daily.    Marland Kitchen esomeprazole (NEXIUM) 40 MG capsule Take 40 mg by mouth daily before breakfast.      . fish oil-omega-3 fatty acids  1000 MG capsule Take 1 g by mouth daily.     . fluticasone (FLONASE) 50 MCG/ACT nasal spray Place 2 sprays into both nostrils daily.     Marland Kitchen lubiprostone (AMITIZA) 8 MCG capsule Take 1 capsule (8 mcg total) by mouth 2 (two) times daily with a meal. 60 capsule 3  . methylcellulose (ARTIFICIAL TEARS) 1 % ophthalmic solution Place 1 drop into both eyes 2 (two) times daily as needed (dry eyes).    . Multiple Vitamin (MULTIVITAMIN) capsule Take 1 capsule by mouth daily.      . potassium chloride SA (K-DUR,KLOR-CON) 20 MEQ tablet Take 20 mEq by mouth daily.    . Simethicone 125 MG CAPS Take 1 capsule (125 mg total) by mouth as needed (max 6 capsules per 24 hours). 28 each 0  . traMADol (ULTRAM) 50 MG tablet Take 50 mg by mouth every 6 (six) hours as needed. For pain    . triamcinolone ointment (KENALOG) 0.1 % Apply 1 application topically as needed (rash).     . zolpidem (AMBIEN) 10 MG tablet Take 10 mg by mouth at bedtime as needed for sleep.     Marland Kitchen ondansetron (ZOFRAN-ODT) 4 MG disintegrating tablet Take 4 mg by mouth every 8 (eight) hours as needed for nausea. Reported on 11/18/2015     No current facility-administered medications for this visit.    Allergies as of 11/18/2015 - Review Complete 11/18/2015  Allergen Reaction Noted  . Sulfonamide derivatives Hives and Itching   . Latex Hives, Itching, and Rash 07/01/2009    Family History  Problem Relation Age of Onset  . Heart failure Mother   . Diabetes Sister   . Colon cancer Neg Hx     Social History   Social History  . Marital Status: Divorced    Spouse Name: N/A  . Number of Children: 1  . Years of Education: N/A   Social History Main Topics  . Smoking status: Former Smoker -- 1.00 packs/day for 25 years    Types: Cigarettes    Quit date: 09/14/2007  . Smokeless tobacco: Never Used  . Alcohol Use: No  . Drug Use: No  . Sexual Activity: Yes    Birth Control/ Protection: Post-menopausal, Surgical   Other Topics Concern  . None    Social History Narrative    Review of Systems: 10-point ROS negative except as per HPI.   Physical Exam: BP 127/85 mmHg  Pulse 85  Temp(Src) 98.8 F (37.1 C)  Ht 5\' 4"  (1.626 m)  Wt 154 lb 6.4 oz (70.035 kg)  BMI 26.49 kg/m2 General:   Alert and oriented. Pleasant and cooperative. Well-nourished and well-developed.  Head:  Normocephalic and atraumatic. Eyes:  Without icterus, sclera clear and conjunctiva pink.  Ears:  Normal auditory acuity. Cardiovascular:  S1, S2 present without murmurs appreciated. Extremities without clubbing or edema. Respiratory:  Clear to auscultation bilaterally. No wheezes, rales, or rhonchi. No distress.  Gastrointestinal:  +BS, soft, non-tender and non-distended. No HSM noted. No guarding or rebound. No masses appreciated.  Rectal:  Deferred  Neurologic:  Alert and oriented x4;  grossly normal neurologically. Psych:  Alert and cooperative. Normal mood and affect.    11/18/2015 10:11 AM

## 2015-11-18 NOTE — Assessment & Plan Note (Signed)
GERD symptoms currently very well controlled on current PPI. Recommend continue PPI as recommended previously. Return for follow-up as needed for any worsening or recurrent symptoms. Call us if you need any refills on her medications.

## 2015-11-18 NOTE — Assessment & Plan Note (Signed)
Patient with recent rectal bleeding status post colonoscopy which showed bleeding likely due to large internal hemorrhoids. Hemorrhoids currently well-controlled. No further bleeding noted. Return for follow-up as needed.

## 2015-11-27 ENCOUNTER — Encounter (HOSPITAL_COMMUNITY): Payer: Self-pay | Admitting: Oncology

## 2015-11-27 ENCOUNTER — Encounter (HOSPITAL_COMMUNITY): Payer: Medicare Other | Attending: Oncology | Admitting: Oncology

## 2015-11-27 ENCOUNTER — Other Ambulatory Visit (HOSPITAL_COMMUNITY): Payer: Self-pay | Admitting: Oncology

## 2015-11-27 ENCOUNTER — Encounter (HOSPITAL_COMMUNITY): Payer: Medicare Other

## 2015-11-27 VITALS — BP 134/88 | HR 67 | Temp 98.4°F | Resp 16 | Wt 153.0 lb

## 2015-11-27 DIAGNOSIS — D500 Iron deficiency anemia secondary to blood loss (chronic): Secondary | ICD-10-CM

## 2015-11-27 DIAGNOSIS — Q2730 Arteriovenous malformation, site unspecified: Secondary | ICD-10-CM | POA: Diagnosis not present

## 2015-11-27 DIAGNOSIS — K5520 Angiodysplasia of colon without hemorrhage: Secondary | ICD-10-CM

## 2015-11-27 LAB — CBC WITH DIFFERENTIAL/PLATELET
BASOS ABS: 0 10*3/uL (ref 0.0–0.1)
BASOS PCT: 0 %
EOS ABS: 0.2 10*3/uL (ref 0.0–0.7)
EOS PCT: 4 %
HCT: 33.1 % — ABNORMAL LOW (ref 36.0–46.0)
Hemoglobin: 10.9 g/dL — ABNORMAL LOW (ref 12.0–15.0)
LYMPHS PCT: 44 %
Lymphs Abs: 1.7 10*3/uL (ref 0.7–4.0)
MCH: 29.1 pg (ref 26.0–34.0)
MCHC: 32.9 g/dL (ref 30.0–36.0)
MCV: 88.5 fL (ref 78.0–100.0)
MONO ABS: 0.3 10*3/uL (ref 0.1–1.0)
Monocytes Relative: 7 %
Neutro Abs: 1.8 10*3/uL (ref 1.7–7.7)
Neutrophils Relative %: 45 %
PLATELETS: 324 10*3/uL (ref 150–400)
RBC: 3.74 MIL/uL — AB (ref 3.87–5.11)
RDW: 15.2 % (ref 11.5–15.5)
WBC: 4 10*3/uL (ref 4.0–10.5)

## 2015-11-27 LAB — FERRITIN: FERRITIN: 22 ng/mL (ref 11–307)

## 2015-11-27 NOTE — Assessment & Plan Note (Addendum)
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 Additionally, she is on PPI therapy for GERD which also may be a contributing factor resulting in malabsorption.  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  S/P EGD/Colonoscopy by Dr. Oneida Alar on 06/29/2015 without any evidence of dysplasia or malignancy, but noted chronic inactive gastritis, four colon polyps negative for malignancy, and large internal bleeding hemorrhoids.  She will be due for another EGD/Colonoscopy in 10-15 years depending on risk:benfit ratio.  Labs today and 3 months: CBC ferritin.  Labs in 6 months: CBC diff, BMET, iron/TIBC, ferritin.  Oncology Flowsheet 01/29/2015 05/08/2015 08/07/2015  ferric gluconate (NULECIT) IV  125 mg   ferumoxytol ([name]) IV 510 mg  510 mg   If she needs iron based upon today's lab results, I will consider recalling her for some labs in 6 weeks to confirm a response to IV iron and see if she needs further IV iron replacement (instead of waiting 3 months).    Return in 6 months for follow-up.

## 2015-11-27 NOTE — Progress Notes (Signed)
Shade Flood, MD [address] [place] [address]  Iron deficiency anemia due to chronic blood loss - Plan: CBC, Ferritin, CBC with Differential, Basic metabolic panel, Iron and TIBC, Ferritin  CURRENT THERAPY: IV iron as indicated.  Oncology Flowsheet 01/29/2015 05/08/2015 08/07/2015  ferric gluconate (NULECIT) IV  125 mg   ferumoxytol ([name]) IV 510 mg  510 mg    INTERVAL HISTORY: Melody Mitchell 75 y.o. 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   Chart reviewed.  She is S/P EGD/Colonoscopy by Dr. Oneida Alar on 06/29/2015.  EGD demonstrated:  MILD Non-erosive gastritis-NO OBVIOUS SOURCE FOR DYSPEPSIA IDENTIFIED.  Her colonscopy showed: FOUR COLON POLYPS REMOVED . NO SOURCE FOR CHANGE IN BOWEL HABITS IDENTIFIED and RECTAL BLEEDING DUE TO Large internal hemorrhoids.  I personally reviewed and went over pathology results with the patient.  No evidence of dysplasia or malignancy noted on pathology.  I personally reviewed and went over laboratory results with the patient.  The results are noted within this dictation.  Labs will be updated today.  I personally reviewed and went over radiographic studies with the patient.  The results are noted within this dictation. March 2016 mammogram was negative.   Today, she denies any blood loss including blood in stool, black sticky stools, hematuria, gingival blood loss, hemoptysis, etc.  She notes that she is doing well.  She denies any hematologic complaints today.  Her knees continue to be her biggest complaint with pain.    She just turned 75 yo on 11/16/2015.  Past Medical History  Diagnosis Date  . High cholesterol   . Acid reflux   . Hx: recurrent pneumonia   . IDA (iron deficiency anemia)     chronic iron infusions  . History of contact dermatitis   . Arthritis   . Bronchitis january 2014  . Hypertension    . Vertigo   . Hiatal hernia   . Reflux   . AVM (arteriovenous malformation) of colon, acquired 07/01/2009         has Iron deficiency anemia due to chronic blood loss; HEMORRHOIDS; AVM (arteriovenous malformation) of colon, acquired; GERD; High cholesterol; Acid reflux; Heme positive stool; Change in bowel habits; and Abdominal pain, epigastric on her problem list.     is allergic to sulfonamide derivatives and latex.  [name] does not currently have medications on file.  Past Surgical History  Procedure Laterality Date  . Abdominal hysterectomy    . Appendectomy    . Cholecystectomy    . Bilateral breast biopsies  benign    . Colonoscopy w/ biopsies  6 2010    Dr. Oneida Alar: Polypoid appearing lesion of the ascending colon, 3 mm sessile rectal polyp, small internal hemorrhoids. Pathology revealed polypoid mucosa, no adenomatous changes  . Esophagogastroduodenoscopy  04/2009    Dr. Oneida Alar: Patent Schatzki ring dilated with advancing the scope, patchy erythema in the antrum, 2 small AVMs in the duodenal bulb, 2 additional AVMs noted in the second portion the duodenum, mild gastritis on pathology  . Double-balloon enteroscopy, antegrade  April 2011    Dr. 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  . Colonoscopy N/A 06/29/2015    SLF: The left colon is redundant 2. four colon polyps removed. No source  for change in bowel habits identified 3. Rectal bleeding due to large internal hemorrhoids   .  Esophagogastroduodenoscopy N/A 06/29/2015    SLF: 1. 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 small hiatal hernia 3. Mild non-erosive gastririts- NO obvious source for dyspepsia identified.     Denies any headaches, dizziness, double vision, fevers, chills, night sweats, nausea, vomiting, diarrhea, constipation, chest pain, heart palpitations, shortness of breath, blood in stool, black tarry stool, urinary pain, urinary burning, urinary frequency, hematuria.     PHYSICAL EXAMINATION  ECOG PERFORMANCE STATUS: 0 - Asymptomatic  Filed Vitals:   11/27/15 1215  BP: 134/88  Pulse: 67  Temp: 98.4 F (36.9 C)  Resp: 16    GENERAL:alert, no distress, well nourished, well developed, comfortable, cooperative and smiling, and unaccompanied today. SKIN: skin color, texture, turgor are normal, no rashes or significant lesions HEAD: Normocephalic EYES: normal, PERRLA, EOMI, Conjunctiva are pink and non-injected EARS: External ears normal OROPHARYNX:mucous membranes are moist  NECK: supple, trachea midline, no adenopathy LYMPH:  No adenopathy noted. BREAST:not examined LUNGS: clear to auscultation and percussion  HEART: regular rate & rhythm without murmur, rub, or gallop. ABDOMEN:abdomen soft, non-tender and normal bowel sounds BACK: Back symmetric, no curvature. EXTREMITIES:less then 2 second capillary refill, no joint deformities, effusion, or inflammation, no skin discoloration, no clubbing, no cyanosis. NEURO: alert & oriented x 3 with fluent speech, no focal motor/sensory deficits, gait normal    LABORATORY DATA: CBC    Component Value Date/Time   WBC 4.7 08/05/2015 1023   RBC 3.68* 08/05/2015 1023   RBC 3.22* 05/09/2009 0743   HGB 10.2* 08/05/2015 1023   HCT 33.2* 08/05/2015 1023   PLT 337 08/05/2015 1023   MCV 90.2 08/05/2015 1023   MCH 27.7 08/05/2015 1023   MCHC 30.7 08/05/2015 1023   RDW 14.7 08/05/2015 1023   LYMPHSABS 1.2 06/05/2015 1322   MONOABS 0.3 06/05/2015 1322   EOSABS 0.2 06/05/2015 1322   BASOSABS 0.0 06/05/2015 1322      Chemistry      Component Value Date/Time   NA 139 06/05/2015 1322   K 3.9 06/05/2015 1322   CL 104 06/05/2015 1322   CO2 28 06/05/2015 1322   BUN 12 06/05/2015 1322   CREATININE 0.86 06/05/2015 1322      Component Value Date/Time   CALCIUM 9.0 06/05/2015 1322   ALKPHOS 76 06/05/2015 1322   AST 23 06/05/2015 1322   ALT 16 06/05/2015 1322   BILITOT 0.2* 06/05/2015 1322     Lab  Results  Component Value Date   IRON 37* 02/06/2012   TIBC 250 02/06/2012   FERRITIN 23 08/05/2015    RADIOGRAPHIC STUDIES:  02/06/2015  CLINICAL DATA: Screening.  EXAM: DIGITAL SCREENING BILATERAL MAMMOGRAM WITH CAD  COMPARISON: Previous exam(s).  ACR Breast Density Category b: There are scattered areas of fibroglandular density.  FINDINGS: There are no findings suspicious for malignancy. Images were processed with CAD.  IMPRESSION: No mammographic evidence of malignancy. A result letter of this screening mammogram will be mailed directly to the patient.  RECOMMENDATION: Screening mammogram in one year. (Code:SM-B-01Y)  BI-RADS CATEGORY 1: Negative.   Electronically Signed  By: Everlean Alstrom M.D.  On: 02/06/2015 15:01   PATHOLOGY:  Diagnosis 1. Colon, polyp(s), ascending and transverse - BENIGN POLYPOID COLORECTAL MUCOSA. - THERE IS NO EVIDENCE OF MALIGNANCY. 2. Colon, polyp(s), sigmoid X 2 - TUBULAR ADENOMA(S). - HIGH GRADE DYSPLASIA IS NOT IDENTIFIED. 3. Duodenum, Biopsy - BENIGN SMALL BOWEL MUCOSA. - NO ACTIVE INFLAMMATION OR VILLOUS ATROPHY IDENTIFIED. 4. Stomach, biopsy - CHRONIC INACTIVE GASTRITIS. - THERE IS NO EVIDENCE  OF HELICOBACTER PYLORI, DYSPLASIA, OR MALIGNANCY. - SEE COMMENT. Microscopic Comment 4. Given the presence of inflammation, a Warthin-Starry stain was performed, which was negative. (JBK:ds 07/01/15) Enid Cutter MD Pathologist, Electronic Signature (Case signed 07/01/2015)    ASSESSMENT:   Iron deficiency anemia due to chronic blood loss Iron deficiency anemia requiring IV [name] with failure of PO iron maintaining iron stores secondary to AVMs. Additionally, she is on PPI therapy for GERD which also may be a contributing factor resulting in malabsorption.  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  S/P EGD/Colonoscopy by Dr. Oneida Alar on 06/29/2015 without any  evidence of dysplasia or malignancy, but noted chronic inactive gastritis, four colon polyps negative for malignancy, and large internal bleeding hemorrhoids.  She will be due for another EGD/Colonoscopy in 10-15 years depending on risk:benfit ratio.  Labs today and 3 months: CBC ferritin.  Labs in 6 months: CBC diff, BMET, iron/TIBC, ferritin.  Oncology Flowsheet 01/29/2015 05/08/2015 08/07/2015  ferric gluconate (NULECIT) IV  125 mg   ferumoxytol ([name]) IV 510 mg  510 mg   If she needs iron based upon today's lab results, I will consider recalling her for some labs in 6 weeks to confirm a response to IV iron and see if she needs further IV iron replacement (instead of waiting 3 months).    Return in 6 months for follow-up.   THERAPY PLAN:  Continue with labs every 3 months and administer IV iron when indicated.   All questions were answered. The patient knows to call the clinic with any problems, questions or concerns. We can certainly see the patient much sooner if necessary.  Patient and plan discussed with Dr. Ancil Linsey and she is in agreement with the aforementioned.   KEFALAS,THOMAS 11/27/2015 1:03 PM

## 2015-11-27 NOTE — Patient Instructions (Addendum)
[place] at [facility] Discharge Instructions  RECOMMENDATIONS MADE BY THE CONSULTANT AND ANY TEST RESULTS WILL BE SENT TO YOUR REFERRING PHYSICIAN.    Exam and discussion with Kirby Crigler, PA. Lab work today Return for labs in 3 months and 6 months Return to the doctor in 6 months Please call the clinic if you have any questions or concerns     Thank you for choosing [place] at [facility] to provide your oncology and hematology care.  To afford each patient quality time with our provider, please arrive at least 15 minutes before your scheduled appointment time.    You need to re-schedule your appointment should you arrive 10 or more minutes late.  We strive to give you quality time with our providers, and arriving late affects you and other patients whose appointments are after yours.  Also, if you no show three or more times for appointments you may be dismissed from the clinic at the providers discretion.     Again, thank you for choosing [facility].  Our hope is that these requests will decrease the amount of time that you wait before being seen by our physicians.       _____________________________________________________________  Should you have questions after your visit to [facility], please contact our office at [phone] between the hours of 8:30 a.m. and 4:30 p.m.  Voicemails left after 4:30 p.m. will not be returned until the following business day.  For prescription refill requests, have your pharmacy contact our office.

## 2015-12-04 ENCOUNTER — Encounter (HOSPITAL_BASED_OUTPATIENT_CLINIC_OR_DEPARTMENT_OTHER): Payer: Medicare Other

## 2015-12-04 VITALS — BP 162/79 | HR 68 | Temp 98.4°F | Resp 16

## 2015-12-04 DIAGNOSIS — D500 Iron deficiency anemia secondary to blood loss (chronic): Secondary | ICD-10-CM

## 2015-12-04 DIAGNOSIS — K5520 Angiodysplasia of colon without hemorrhage: Secondary | ICD-10-CM

## 2015-12-04 MED ORDER — SODIUM CHLORIDE 0.9 % IV SOLN
510.0000 mg | Freq: Once | INTRAVENOUS | Status: AC
  Administered 2015-12-04: 510 mg via INTRAVENOUS
  Filled 2015-12-04: qty 17

## 2015-12-04 MED ORDER — SODIUM CHLORIDE 0.9 % IV SOLN
INTRAVENOUS | Status: DC
  Administered 2015-12-04: 11:00:00 via INTRAVENOUS

## 2015-12-04 NOTE — Progress Notes (Signed)
Patient tolerated infusion well.  VSS.   

## 2015-12-04 NOTE — Patient Instructions (Signed)
[place] at [facility] Discharge Instructions  RECOMMENDATIONS MADE BY THE CONSULTANT AND ANY TEST RESULTS WILL BE SENT TO YOUR REFERRING PHYSICIAN.  IV Iron today.  Return as scheduled for follow up.    Thank you for choosing [place] at [facility] to provide your oncology and hematology care.  To afford each patient quality time with our provider, please arrive at least 15 minutes before your scheduled appointment time.   Beginning January 23rd 2017 lab work for the [facility] will be done in the  Main lab at [facility] on 1st floor. If you have a lab appointment with the [facility] please come in thru the  Main Entrance and check in at the main information desk  You need to re-schedule your appointment should you arrive 10 or more minutes late.  We strive to give you quality time with our providers, and arriving late affects you and other patients whose appointments are after yours.  Also, if you no show three or more times for appointments you may be dismissed from the clinic at the providers discretion.     Again, thank you for choosing [facility].  Our hope is that these requests will decrease the amount of time that you wait before being seen by our physicians.       _____________________________________________________________  Should you have questions after your visit to [facility], please contact our office at [phone] between the hours of 8:30 a.m. and 4:30 p.m.  Voicemails left after 4:30 p.m. will not be returned until the following business day.  For prescription refill requests, have your pharmacy contact our office.

## 2016-01-15 ENCOUNTER — Other Ambulatory Visit (HOSPITAL_COMMUNITY): Payer: Medicare Other

## 2016-02-22 ENCOUNTER — Encounter (HOSPITAL_COMMUNITY): Payer: Self-pay | Admitting: *Deleted

## 2016-02-22 ENCOUNTER — Emergency Department (HOSPITAL_COMMUNITY)
Admission: EM | Admit: 2016-02-22 | Discharge: 2016-02-22 | Disposition: A | Discharge status: Home / Self Care | Payer: Medicare Other | Attending: Emergency Medicine | Admitting: Emergency Medicine

## 2016-02-22 DIAGNOSIS — D649 Anemia, unspecified: Secondary | ICD-10-CM

## 2016-02-22 DIAGNOSIS — R42 Dizziness and giddiness: Secondary | ICD-10-CM | POA: Diagnosis present

## 2016-02-22 DIAGNOSIS — Z79899 Other long term (current) drug therapy: Secondary | ICD-10-CM | POA: Diagnosis not present

## 2016-02-22 DIAGNOSIS — Z87891 Personal history of nicotine dependence: Secondary | ICD-10-CM | POA: Diagnosis not present

## 2016-02-22 DIAGNOSIS — I10 Essential (primary) hypertension: Secondary | ICD-10-CM | POA: Insufficient documentation

## 2016-02-22 LAB — URINALYSIS, ROUTINE W REFLEX MICROSCOPIC
BILIRUBIN URINE: NEGATIVE
GLUCOSE, UA: NEGATIVE mg/dL
HGB URINE DIPSTICK: NEGATIVE
KETONES UR: NEGATIVE mg/dL
Leukocytes, UA: NEGATIVE
Nitrite: NEGATIVE
Protein, ur: NEGATIVE mg/dL
Specific Gravity, Urine: >1.03 — ABNORMAL HIGH (ref 1.005–1.030)
pH: 5.5 (ref 5.0–8.0)

## 2016-02-22 LAB — CBC
HCT: 32.3 % — ABNORMAL LOW (ref 36.0–46.0)
Hemoglobin: 10.6 g/dL — ABNORMAL LOW (ref 12.0–15.0)
MCH: 29.5 pg (ref 26.0–34.0)
MCHC: 32.8 g/dL (ref 30.0–36.0)
MCV: 90 fL (ref 78.0–100.0)
PLATELETS: 313 10*3/uL (ref 150–400)
RBC: 3.59 MIL/uL — ABNORMAL LOW (ref 3.87–5.11)
RDW: 14.7 % (ref 11.5–15.5)
WBC: 5.5 10*3/uL (ref 4.0–10.5)

## 2016-02-22 LAB — BASIC METABOLIC PANEL
Anion gap: 8 (ref 5–15)
BUN: 15 mg/dL (ref 6–20)
CHLORIDE: 107 mmol/L (ref 101–111)
CO2: 23 mmol/L (ref 22–32)
CREATININE: 0.95 mg/dL (ref 0.44–1.00)
Calcium: 8.7 mg/dL — ABNORMAL LOW (ref 8.9–10.3)
GFR calc Af Amer: >60 mL/min (ref >60)
GFR calc non Af Amer: 56 mL/min — ABNORMAL LOW (ref >60)
Glucose, Bld: 117 mg/dL — ABNORMAL HIGH (ref 65–99)
Potassium: 3.2 mmol/L — ABNORMAL LOW (ref 3.5–5.1)
Sodium: 138 mmol/L (ref 135–145)

## 2016-02-22 LAB — CBG MONITORING, ED: Glucose-Capillary: 122 mg/dL — ABNORMAL HIGH (ref 65–99)

## 2016-02-22 MED ORDER — POTASSIUM CHLORIDE CRYS ER 20 MEQ PO TBCR
20.0000 meq | EXTENDED_RELEASE_TABLET | Freq: Once | ORAL | Status: AC
  Administered 2016-02-22: 20 meq via ORAL
  Filled 2016-02-22: qty 1

## 2016-02-22 NOTE — ED Notes (Signed)
MD at bedside. 

## 2016-02-22 NOTE — ED Provider Notes (Signed)
CSN: [id number]     Arrival date & time 02/22/16  1616 History   First MD Initiated Contact with Patient 02/22/16 1624     Chief Complaint  Patient presents with  . Dizziness     (Consider location/radiation/quality/duration/timing/severity/associated sxs/prior Treatment) HPI.... Lightheadedness for approximate 5 days. She has been anemic in the past requiring iron transfusions. No frank neurological deficits, fever, chills, stiff neck, chest pain, dyspnea. She has tried meclizine with minimal relief. Severity is mild to moderate.  Past Medical History  Diagnosis Date  . High cholesterol   . Acid reflux   . Hx: recurrent pneumonia   . IDA (iron deficiency anemia)     chronic iron infusions  . History of contact dermatitis   . Arthritis   . Bronchitis january 2014  . Hypertension   . Vertigo   . Hiatal hernia   . Reflux   . 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 Abdominal hysterectomy    . Appendectomy    . Cholecystectomy    . Bilateral breast biopsies  benign    . Colonoscopy w/ biopsies  6 2010    Dr. Oneida Alar: Polypoid appearing lesion of the ascending colon, 3 mm sessile rectal polyp, small internal hemorrhoids. Pathology revealed polypoid mucosa, no adenomatous changes  . Esophagogastroduodenoscopy  04/2009    Dr. Oneida Alar: Patent Schatzki ring dilated with advancing the scope, patchy erythema in the antrum, 2 small AVMs in the duodenal bulb, 2 additional AVMs noted in the second portion the duodenum, mild gastritis on pathology  . Double-balloon enteroscopy, antegrade  April 2011    Dr. 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  . Colonoscopy N/A 06/29/2015    SLF: The left colon is redundant 2. four colon polyps removed. No source  for change in bowel habits identified 3. Rectal bleeding due to large internal hemorrhoids   . Esophagogastroduodenoscopy N/A  06/29/2015    SLF: 1. 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 small hiatal hernia 3. Mild non-erosive gastririts- NO obvious source for dyspepsia identified.    Family History  Problem Relation Age of Onset  . Heart failure Mother   . Diabetes Sister   . Colon cancer Neg Hx    Social History  Substance Use Topics  . Smoking status: Former Smoker -- 1.00 packs/day for 25 years    Types: Cigarettes    Quit date: 09/14/2007  . Smokeless tobacco: Never Used  . Alcohol Use: No   OB History    No data available     Review of Systems  All other systems reviewed and are negative.     Allergies  Sulfonamide derivatives and Latex  Home Medications   Prior to Admission medications   Medication Sig Start Date End Date Taking? Authorizing Provider  acetaminophen (TYLENOL) 500 MG tablet Take 500-1,000 mg by mouth every 6 (six) hours as needed for mild pain.    Yes Historical Provider, MD  amitriptyline (ELAVIL) 25 MG tablet Take 25 mg by mouth at bedtime as needed for sleep.    Yes Historical Provider, MD  celecoxib (CELEBREX) 100 MG capsule Take 100 mg by mouth daily.   Yes Historical Provider, MD  Cetirizine HCl 10 MG CAPS Take 1 capsule by mouth daily.   Yes Historical Provider, MD  docusate sodium (COLACE) 100 MG capsule Take 100 mg by mouth daily as needed for mild  constipation or moderate constipation.    Yes Historical Provider, MD  escitalopram (LEXAPRO) 10 MG tablet Take 10 mg by mouth daily. 10/25/13  Yes Historical Provider, MD  esomeprazole (NEXIUM) 40 MG capsule Take 40 mg by mouth daily before breakfast.     Yes Historical Provider, MD  fish oil-omega-3 fatty acids 1000 MG capsule Take 1 g by mouth daily.    Yes Historical Provider, MD  fluticasone (FLONASE) 50 MCG/ACT nasal spray Place 2 sprays into both nostrils daily.  04/11/13  Yes Historical Provider, MD  meclizine (ANTIVERT) 25 MG tablet Take 25 mg by mouth daily.    Yes Historical Provider, MD  methylcellulose  (ARTIFICIAL TEARS) 1 % ophthalmic solution Place 1 drop into both eyes 2 (two) times daily as needed (dry eyes).   Yes Historical Provider, MD  montelukast (SINGULAIR) 10 MG tablet Take 10 mg by mouth every evening. 01/27/16  Yes Historical Provider, MD  Multiple Vitamin (MULTIVITAMIN) capsule Take 1 capsule by mouth daily.     Yes Historical Provider, MD  potassium chloride SA (K-DUR,KLOR-CON) 20 MEQ tablet Take 20 mEq by mouth daily.   Yes Historical Provider, MD  Simethicone 125 MG CAPS Take 1 capsule (125 mg total) by mouth as needed (max 6 capsules per 24 hours). 06/05/15  Yes Orpah Greek, MD  traMADol (ULTRAM) 50 MG tablet Take 50 mg by mouth every 6 (six) hours as needed. For pain   Yes Historical Provider, MD  triamcinolone ointment (KENALOG) 0.1 % Apply 1 application topically as needed (rash).  02/26/14  Yes Historical Provider, MD  ZETIA 10 MG tablet Take 10 mg by mouth every morning. 11/30/15  Yes Historical Provider, MD  zolpidem (AMBIEN) 10 MG tablet Take 10 mg by mouth at bedtime as needed for sleep.  02/26/14  Yes Historical Provider, MD   BP 128/83 mmHg  Pulse 71  Temp(Src) 98 F (36.7 C) (Oral)  Resp 16  Ht 5\' 4"  (1.626 m)  Wt 150 lb (68.04 kg)  BMI 25.73 kg/m2  SpO2 98% Physical Exam  Constitutional: She is oriented to person, place, and time. She appears well-developed and well-nourished.  HENT:  Head: Normocephalic and atraumatic.  Eyes: Conjunctivae and EOM are normal. Pupils are equal, round, and reactive to light.  Neck: Normal range of motion. Neck supple.  Cardiovascular: Normal rate and regular rhythm.   Pulmonary/Chest: Effort normal and breath sounds normal.  Abdominal: Soft. Bowel sounds are normal.  Musculoskeletal: Normal range of motion.  Neurological: She is alert and oriented to person, place, and time.  Skin: Skin is warm and dry.  Psychiatric: She has a normal mood and affect. Her behavior is normal.  Nursing note and vitals reviewed.   ED  Course  Procedures (including critical care time) Labs Review Labs Reviewed  BASIC METABOLIC PANEL - Abnormal; Notable for the following:    Potassium 3.2 (*)    Glucose, Bld 117 (*)    Calcium 8.7 (*)    GFR calc non Af Amer 56 (*)    All other components within normal limits  CBC - Abnormal; Notable for the following:    RBC 3.59 (*)    Hemoglobin 10.6 (*)    HCT 32.3 (*)    All other components within normal limits  URINALYSIS, ROUTINE W REFLEX MICROSCOPIC (NOT AT [facility]) - Abnormal; Notable for the following:    Specific Gravity, Urine >1.030 (*)    All other components within normal limits  CBG MONITORING, ED -  Abnormal; Notable for the following:    Glucose-Capillary 122 (*)    All other components within normal limits    Imaging Review No results found. I have personally reviewed and evaluated these images and lab results as part of my medical decision-making.   EKG Interpretation   Date/Time:  Monday February 22 2016 16:31:22 EDT Ventricular Rate:  76 PR Interval:  159 QRS Duration: 103 QT Interval:  389 QTC Calculation: 437 R Axis:   -18 Text Interpretation:  Sinus rhythm Borderline left axis deviation Low  voltage, precordial leads Abnormal R-wave progression, early transition  Nonspecific T abnormalities, diffuse leads Baseline wander in lead(s) I II  aVR aVL Confirmed by DELO  MD, DOUGLAS ([id number]) on 02/22/2016 4:37:44 PM      MDM   Final diagnoses:  Vertigo  Anemia, unspecified anemia type    Uncertain etiology of patient's lightheadedness.  Hemoglobin stable. EKG within normal limits. She has follow-up on Thursday.    Nat Christen, MD 02/22/16 [phone]

## 2016-02-22 NOTE — ED Notes (Signed)
Pt states she is having lightheadedness and dizziness while she moves since Wednesday. Pt denies any unilateral weakness. Pt states she has hx of vertigo and anemia, both of which cause her dizziness.

## 2016-02-22 NOTE — Discharge Instructions (Signed)
Blood count is stable. Follow-up your doctor on Thursday. Increase fluids. Eat regular meals.

## 2016-02-25 ENCOUNTER — Encounter (HOSPITAL_COMMUNITY): Payer: Medicare Other | Attending: Oncology

## 2016-02-25 ENCOUNTER — Ambulatory Visit (HOSPITAL_COMMUNITY)
Admission: RE | Admit: 2016-02-25 | Discharge: 2016-02-25 | Disposition: A | Discharge status: Home / Self Care | Payer: Medicare Other | Source: Ambulatory Visit | Attending: Oncology | Admitting: Oncology

## 2016-02-25 ENCOUNTER — Other Ambulatory Visit (HOSPITAL_COMMUNITY): Payer: Self-pay | Admitting: Oncology

## 2016-02-25 ENCOUNTER — Other Ambulatory Visit (HOSPITAL_COMMUNITY): Payer: Medicare Other

## 2016-02-25 DIAGNOSIS — Z1231 Encounter for screening mammogram for malignant neoplasm of breast: Secondary | ICD-10-CM

## 2016-02-25 DIAGNOSIS — D500 Iron deficiency anemia secondary to blood loss (chronic): Secondary | ICD-10-CM

## 2016-02-25 LAB — CBC
HEMATOCRIT: 33.9 % — AB (ref 36.0–46.0)
Hemoglobin: 10.9 g/dL — ABNORMAL LOW (ref 12.0–15.0)
MCH: 29.2 pg (ref 26.0–34.0)
MCHC: 32.2 g/dL (ref 30.0–36.0)
MCV: 90.9 fL (ref 78.0–100.0)
Platelets: 297 10*3/uL (ref 150–400)
RBC: 3.73 MIL/uL — AB (ref 3.87–5.11)
RDW: 14.8 % (ref 11.5–15.5)
WBC: 4.4 10*3/uL (ref 4.0–10.5)

## 2016-02-25 LAB — FERRITIN: Ferritin: 26 ng/mL (ref 11–307)

## 2016-02-29 ENCOUNTER — Other Ambulatory Visit (HOSPITAL_COMMUNITY): Payer: Medicare Other

## 2016-03-07 ENCOUNTER — Encounter (HOSPITAL_BASED_OUTPATIENT_CLINIC_OR_DEPARTMENT_OTHER): Payer: Medicare Other

## 2016-03-07 VITALS — BP 116/87 | HR 84 | Temp 98.5°F | Resp 18

## 2016-03-07 DIAGNOSIS — D500 Iron deficiency anemia secondary to blood loss (chronic): Secondary | ICD-10-CM

## 2016-03-07 MED ORDER — SODIUM CHLORIDE 0.9 % IV SOLN
510.0000 mg | Freq: Once | INTRAVENOUS | Status: AC
  Administered 2016-03-07: 510 mg via INTRAVENOUS
  Filled 2016-03-07: qty 17

## 2016-03-07 MED ORDER — SODIUM CHLORIDE 0.9% FLUSH
10.0000 mL | Freq: Once | INTRAVENOUS | Status: AC
  Administered 2016-03-07: 10 mL via INTRAVENOUS

## 2016-03-07 MED ORDER — SODIUM CHLORIDE 0.9 % IV SOLN
INTRAVENOUS | Status: DC
Start: 2016-03-07 — End: 2016-03-07
  Administered 2016-03-07: 10:00:00 via INTRAVENOUS

## 2016-03-07 NOTE — Progress Notes (Signed)
Tolerated iron infusion well. Ambulatory on discharge home to self. 

## 2016-03-07 NOTE — Patient Instructions (Signed)
[facility] at [facility] Discharge Instructions  RECOMMENDATIONS MADE BY THE CONSULTANT AND ANY TEST RESULTS WILL BE SENT TO YOUR REFERRING PHYSICIAN.  [name] 510 mg iron infusion given as ordered. Return as scheduled.  Thank you for choosing [facility] at  [facility] to provide your oncology and hematology care.  To afford each patient quality time with our provider, please arrive at least 15 minutes before your scheduled appointment time.   Beginning January 23rd 2017 lab work for the Cancer Center will be done in the  Main lab at [facility] on 1st floor. If you have a lab appointment with the Cancer Center please come in thru the  Main Entrance and check in at the main information desk  You need to re-schedule your appointment should you arrive 10 or more minutes late.  We strive to give you quality time with our providers, and arriving late affects you and other patients whose appointments are after yours.  Also, if you no show three or more times for appointments you may be dismissed from the clinic at the providers discretion.     Again, thank you for choosing [facility].  Our hope is that these requests will decrease the amount of time that you wait before being seen by our physicians.       _____________________________________________________________  Should you have questions after your visit to [facility], please contact our office at [phone] between the hours of 8:30 a.m. and 4:30 p.m.  Voicemails left after 4:30 p.m. will not be returned until the following business day.  For prescription refill requests, have your pharmacy contact our office.         Resources For Cancer Patients and their Caregivers ? American Cancer Society: Can assist with transportation, wigs, general needs, runs Look Good Feel Better.        1-888-227-6333 ? Cancer Care: Provides financial assistance, online support  groups, medication/co-pay assistance.  1-800-813-HOPE (4673) ? [facility] Assists [place] cancer patients and their families through emotional , educational and financial support.  336-427-4357 ? [place] DSS Where to apply for food stamps, Medicaid and utility assistance. 336-342-1394 ? RCATS: Transportation to medical appointments. 336-347-2287 ? Social Security Administration: May apply for disability if have a Stage IV cancer. 336-342-7796 1-800-772-1213 ? [place] Aging, Disability and Transit Services: Assists with nutrition, care and transit needs. 336-349-2343 

## 2016-05-26 ENCOUNTER — Ambulatory Visit (HOSPITAL_COMMUNITY): Payer: Medicare Other | Admitting: Oncology

## 2016-05-26 NOTE — Assessment & Plan Note (Addendum)
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 Additionally, she is on PPI therapy for GERD which also may be a contributing factor resulting in malabsorption.  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  S/P EGD/Colonoscopy by Dr. Oneida Alar on 06/29/2015 without any evidence of dysplasia or malignancy, but noted chronic inactive gastritis, four colon polyps negative for malignancy, and large internal bleeding hemorrhoids.  She will be due for another EGD/Colonoscopy in 10-15 years depending on risk:benfit ratio.  Labs today and 3 months: CBC diff, BMET, iron/TIBC, ferritin.  I personally reviewed and went over laboratory results with the patient.  The results are noted within this dictation.  Ferritin is pending at this time.  HGB is improved compared to April.  Oncology Flowsheet 08/07/2015 12/04/2015 03/07/2016  ferumoxytol ([name]) IV 510 mg 510 mg 510 mg    I personally reviewed and went over radiographic studies with the patient.  The results are noted within this dictation.  Mammogram in April 2017 was negative.  She will be due for her next mammogram in April 2018.    Labs in 3 months: CBC diff, ferritin  Labs in 6 months: CBC diff, BMET, iron/TIBC, ferritin.  Return in 6 months for follow-up.

## 2016-05-26 NOTE — Progress Notes (Signed)
[facility] [address] [place] [address]  Iron deficiency anemia due to chronic blood loss  CURRENT THERAPY: IV iron as indicated.  INTERVAL HISTORY: Melody Mitchell 74 y.o. female returns for followup of iron deficiency anemia requiring IV [name] with failure of PO iron maintaining iron stores secondary to AVMs.   She is doing very well.  She denies any complaints.  She does not see any blood in her stool or black tarry stool.    She just had right cataract removed and is scheduled to have the left one addressed in the near future.  "My ride just called and they are downstairs waiting on me."  Review of Systems  Constitutional: Negative.  Negative for fever, chills and weight loss.  HENT: Negative.  Negative for nosebleeds.   Eyes: Negative.   Respiratory: Negative.  Negative for hemoptysis.   Cardiovascular: Negative.   Gastrointestinal: Negative.  Negative for blood in stool and melena.  Genitourinary: Negative.  Negative for hematuria.  Musculoskeletal: Negative.   Skin: Negative.   Neurological: Negative.   Endo/Heme/Allergies: Negative.  Does not bruise/bleed easily.  Psychiatric/Behavioral: Negative.     Past Medical History  Diagnosis Date  . High cholesterol   . Acid reflux   . Hx: recurrent pneumonia   . IDA (iron deficiency anemia)     chronic iron infusions  . History of contact dermatitis   . Arthritis   . Bronchitis january 2014  . Hypertension   . Vertigo   . Hiatal hernia   . Reflux   . 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 Abdominal hysterectomy    . Appendectomy    . Cholecystectomy    . Bilateral breast biopsies  benign    . Colonoscopy w/ biopsies  6 2010    Dr. Oneida Alar: Polypoid appearing lesion of the ascending colon, 3 mm sessile rectal polyp, small internal hemorrhoids. Pathology revealed polypoid mucosa, no  adenomatous changes  . Esophagogastroduodenoscopy  04/2009    Dr. Oneida Alar: Patent Schatzki ring dilated with advancing the scope, patchy erythema in the antrum, 2 small AVMs in the duodenal bulb, 2 additional AVMs noted in the second portion the duodenum, mild gastritis on pathology  . Double-balloon enteroscopy, antegrade  April 2011    Dr. 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  . Colonoscopy N/A 06/29/2015    SLF: The left colon is redundant 2. four colon polyps removed. No source  for change in bowel habits identified 3. Rectal bleeding due to large internal hemorrhoids   . Esophagogastroduodenoscopy N/A 06/29/2015    SLF: 1. 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 small hiatal hernia 3. Mild non-erosive gastririts- NO obvious source for dyspepsia identified.     Family History  Problem Relation Age of Onset  . Heart failure Mother   . Diabetes Sister   . Colon cancer Neg Hx     Social History   Social History  . Marital Status: Divorced    Spouse Name: N/A  . Number of Children: 1  . Years of Education: N/A   Social History Main Topics  . Smoking status: Former Smoker -- 1.00 packs/day for 25 years    Types: Cigarettes    Quit date: 09/14/2007  . Smokeless tobacco: Never Used  . Alcohol Use: No  . Drug Use:  No  . Sexual Activity: Yes    Birth Control/ Protection: Post-menopausal, Surgical   Other Topics Concern  . Not on file   Social History Narrative     PHYSICAL EXAMINATION  ECOG PERFORMANCE STATUS: 0 - Asymptomatic  Filed Vitals:   05/27/16 1025  BP: 115/79  Pulse: 67  Temp: 98.9 F (37.2 C)  Resp: 20    GENERAL:alert, no distress, well nourished, well developed, comfortable, cooperative, smiling and unaccompanied  SKIN: skin color, texture, turgor are normal, no rashes or significant lesions HEAD: Normocephalic, No masses, lesions, tenderness or abnormalities EYES: normal, EOMI, Conjunctiva are pink and  non-injected EARS: External ears normal OROPHARYNX:lips, buccal mucosa, and tongue normal and mucous membranes are moist  NECK: supple, trachea midline LYMPH:  not examined BREAST:not examined LUNGS: clear to auscultation  HEART: regular rate & rhythm ABDOMEN:abdomen soft and normal bowel sounds BACK: Back symmetric, no curvature. EXTREMITIES:less then 2 second capillary refill, no joint deformities, effusion, or inflammation, no skin discoloration, no cyanosis  NEURO: alert & oriented x 3 with fluent speech, no focal motor/sensory deficits, gait normal   LABORATORY DATA: CBC    Component Value Date/Time   WBC 3.8* 05/27/2016 0934   RBC 4.03 05/27/2016 0934   RBC 3.22* 05/09/2009 0743   HGB 11.4* 05/27/2016 0934   HCT 36.2 05/27/2016 0934   PLT 267 05/27/2016 0934   MCV 89.8 05/27/2016 0934   MCH 28.3 05/27/2016 0934   MCHC 31.5 05/27/2016 0934   RDW 15.3 05/27/2016 0934   LYMPHSABS 1.2 05/27/2016 0934   MONOABS 0.3 05/27/2016 0934   EOSABS 0.1 05/27/2016 0934   BASOSABS 0.0 05/27/2016 0934      Chemistry      Component Value Date/Time   NA 138 05/27/2016 0934   K 3.9 05/27/2016 0934   CL 105 05/27/2016 0934   CO2 28 05/27/2016 0934   BUN 12 05/27/2016 0934   CREATININE 0.84 05/27/2016 0934      Component Value Date/Time   CALCIUM 8.9 05/27/2016 0934   ALKPHOS 76 06/05/2015 1322   AST 23 06/05/2015 1322   ALT 16 06/05/2015 1322   BILITOT 0.2* 06/05/2015 1322     Lab Results  Component Value Date   IRON 37* 02/06/2012   TIBC 250 02/06/2012   FERRITIN 26 02/25/2016     PENDING LABS:   RADIOGRAPHIC STUDIES:  No results found.   PATHOLOGY:    ASSESSMENT AND PLAN:  Iron deficiency anemia due to chronic blood loss Iron deficiency anemia requiring IV [name] with failure of PO iron maintaining iron stores secondary to AVMs. Additionally, she is on PPI therapy for GERD which also may be a contributing factor resulting in malabsorption.  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  S/P EGD/Colonoscopy by Dr. Oneida Alar on 06/29/2015 without any evidence of dysplasia or malignancy, but noted chronic inactive gastritis, four colon polyps negative for malignancy, and large internal bleeding hemorrhoids.  She will be due for another EGD/Colonoscopy in 10-15 years depending on risk:benfit ratio.  Labs today and 3 months: CBC diff, BMET, iron/TIBC, ferritin.  I personally reviewed and went over laboratory results with the patient.  The results are noted within this dictation.  Ferritin is pending at this time.  HGB is improved compared to April.  Oncology Flowsheet 08/07/2015 12/04/2015 03/07/2016  ferumoxytol ([name]) IV 510 mg 510 mg 510 mg    I personally reviewed and went over radiographic studies with the patient.  The results  are noted within this dictation.  Mammogram in April 2017 was negative.  She will be due for her next mammogram in April 2018.    Labs in 3 months: CBC diff, ferritin  Labs in 6 months: CBC diff, BMET, iron/TIBC, ferritin.  Return in 6 months for follow-up.    ORDERS PLACED FOR THIS ENCOUNTER: No orders of the defined types were placed in this encounter.    MEDICATIONS PRESCRIBED THIS ENCOUNTER: Meds ordered this encounter  Medications  . prednisoLONE acetate (PRED FORTE) 1 % ophthalmic suspension    Sig:     THERAPY PLAN:  Continue with labs every 3 months and administer IV iron when indicated.   All questions were answered. The patient knows to call the clinic with any problems, questions or concerns. We can certainly see the patient much sooner if necessary.  Patient and plan discussed with Dr. Ancil Linsey and she is in agreement with the aforementioned.   This note is electronically signed by: Robynn Pane, PA-C 05/27/2016 11:11 AM

## 2016-05-27 ENCOUNTER — Ambulatory Visit (HOSPITAL_COMMUNITY): Payer: Medicare Other | Admitting: Oncology

## 2016-05-27 ENCOUNTER — Other Ambulatory Visit (HOSPITAL_COMMUNITY): Payer: Medicare Other

## 2016-05-27 ENCOUNTER — Encounter (HOSPITAL_COMMUNITY): Payer: Medicare Other

## 2016-05-27 ENCOUNTER — Encounter (HOSPITAL_COMMUNITY): Payer: Medicare Other | Attending: Oncology | Admitting: Oncology

## 2016-05-27 ENCOUNTER — Other Ambulatory Visit (HOSPITAL_COMMUNITY): Payer: Self-pay | Admitting: Oncology

## 2016-05-27 VITALS — BP 115/79 | HR 67 | Temp 98.9°F | Resp 20 | Wt 153.9 lb

## 2016-05-27 DIAGNOSIS — Q2733 Arteriovenous malformation of digestive system vessel: Secondary | ICD-10-CM

## 2016-05-27 DIAGNOSIS — D500 Iron deficiency anemia secondary to blood loss (chronic): Secondary | ICD-10-CM | POA: Diagnosis not present

## 2016-05-27 LAB — CBC WITH DIFFERENTIAL/PLATELET
BASOS PCT: 0 %
Basophils Absolute: 0 10*3/uL (ref 0.0–0.1)
EOS ABS: 0.1 10*3/uL (ref 0.0–0.7)
EOS PCT: 3 %
HCT: 36.2 % (ref 36.0–46.0)
Hemoglobin: 11.4 g/dL — ABNORMAL LOW (ref 12.0–15.0)
Lymphocytes Relative: 33 %
Lymphs Abs: 1.2 10*3/uL (ref 0.7–4.0)
MCH: 28.3 pg (ref 26.0–34.0)
MCHC: 31.5 g/dL (ref 30.0–36.0)
MCV: 89.8 fL (ref 78.0–100.0)
MONO ABS: 0.3 10*3/uL (ref 0.1–1.0)
MONOS PCT: 8 %
Neutro Abs: 2.1 10*3/uL (ref 1.7–7.7)
Neutrophils Relative %: 56 %
Platelets: 267 10*3/uL (ref 150–400)
RBC: 4.03 MIL/uL (ref 3.87–5.11)
RDW: 15.3 % (ref 11.5–15.5)
WBC: 3.8 10*3/uL — ABNORMAL LOW (ref 4.0–10.5)

## 2016-05-27 LAB — BASIC METABOLIC PANEL
Anion gap: 5 (ref 5–15)
BUN: 12 mg/dL (ref 6–20)
CHLORIDE: 105 mmol/L (ref 101–111)
CO2: 28 mmol/L (ref 22–32)
CREATININE: 0.84 mg/dL (ref 0.44–1.00)
Calcium: 8.9 mg/dL (ref 8.9–10.3)
GFR calc Af Amer: >60 mL/min (ref >60)
GFR calc non Af Amer: >60 mL/min (ref >60)
Glucose, Bld: 96 mg/dL (ref 65–99)
Potassium: 3.9 mmol/L (ref 3.5–5.1)
SODIUM: 138 mmol/L (ref 135–145)

## 2016-05-27 LAB — IRON AND TIBC
Iron: 43 ug/dL (ref 28–170)
Saturation Ratios: 15 % (ref 10.4–31.8)
TIBC: 286 ug/dL (ref 250–450)
UIBC: 243 ug/dL

## 2016-05-27 LAB — FERRITIN: FERRITIN: 49 ng/mL (ref 11–307)

## 2016-05-27 NOTE — Patient Instructions (Addendum)
[place] at [facility] Discharge Instructions  RECOMMENDATIONS MADE BY THE CONSULTANT AND ANY TEST RESULTS WILL BE SENT TO YOUR REFERRING PHYSICIAN.  Labs are stable today.  Iron tests are pending.  When we have the results, we will call you. Labs in 3 months Labs in 6 months Return for follow-up in 6 months. Call us with any questions or concerns.    Thank you for choosing [place] at [facility] to provide your oncology and hematology care.  To afford each patient quality time with our provider, please arrive at least 15 minutes before your scheduled appointment time.   Beginning January 23rd 2017 lab work for the [facility] will be done in the  Main lab at [facility] on 1st floor. If you have a lab appointment with the [place] [place] please come in thru the  Main Entrance and check in at the main information desk  You need to re-schedule your appointment should you arrive 10 or more minutes late.  We strive to give you quality time with our providers, and arriving late affects you and other patients whose appointments are after yours.  Also, if you no show three or more times for appointments you may be dismissed from the clinic at the providers discretion.     Again, thank you for choosing [facility].  Our hope is that these requests will decrease the amount of time that you wait before being seen by our physicians.       _____________________________________________________________  Should you have questions after your visit to [facility], please contact our office at [phone] between the hours of 8:30 a.m. and 4:30 p.m.  Voicemails left after 4:30 p.m. will not be returned until the following business day.  For prescription refill requests, have your pharmacy contact our office.         Resources For Cancer Patients and their Caregivers ? American Cancer Society: Can assist with  transportation, wigs, general needs, runs Look Good Feel Better.        651-094-2011 ? Cancer Care: Provides financial assistance, online support groups, medication/co-pay assistance.  1-800-813-HOPE 250-363-7804) ? [facility] Assists [place] cancer patients and their families through emotional , educational and financial support.  470-349-3124 ? [place] DSS Where to apply for food stamps, Medicaid and utility assistance. 502-512-1955 ? RCATS: Transportation to medical appointments. 715-814-2338 ? Social Security Administration: May apply for disability if have a Stage IV cancer. 539-134-5041 820-243-5811 ? [employer], Disability and Transit Services: Assists with nutrition, care and transit needs. [place] Support Programs: @10RELATIVEDAYS @ > Cancer Support Group  2nd Tuesday of the month 1pm-2pm, Journey Room  > Creative Journey  3rd Tuesday of the month 1130am-1pm, Journey Room  > Look Good Feel Better  1st Wednesday of the month 10am-12 noon, Journey Room (Call [facility] to register [phone])

## 2016-06-10 ENCOUNTER — Encounter (HOSPITAL_BASED_OUTPATIENT_CLINIC_OR_DEPARTMENT_OTHER): Payer: Medicare Other

## 2016-06-10 VITALS — BP 117/87 | HR 65 | Temp 98.4°F | Resp 18 | Wt 155.4 lb

## 2016-06-10 DIAGNOSIS — D500 Iron deficiency anemia secondary to blood loss (chronic): Secondary | ICD-10-CM

## 2016-06-10 MED ORDER — SODIUM CHLORIDE 0.9 % IV SOLN
Freq: Once | INTRAVENOUS | Status: AC
  Administered 2016-06-10: 14:00:00 via INTRAVENOUS

## 2016-06-10 MED ORDER — SODIUM CHLORIDE 0.9 % IV SOLN
510.0000 mg | Freq: Once | INTRAVENOUS | Status: AC
  Administered 2016-06-10: 510 mg via INTRAVENOUS
  Filled 2016-06-10: qty 17

## 2016-06-10 NOTE — Patient Instructions (Signed)
[facility] at [facility] Discharge Instructions  RECOMMENDATIONS MADE BY THE CONSULTANT AND ANY TEST RESULTS WILL BE SENT TO YOUR REFERRING PHYSICIAN.  IV iron today.    Thank you for choosing [facility] at [facility] to provide your oncology and hematology care.  To afford each patient quality time with our provider, please arrive at least 15 minutes before your scheduled appointment time.   Beginning January 23rd 2017 lab work for the Cancer Center will be done in the  Main lab at [place] on 1st floor. If you have a lab appointment with the Cancer Center please come in thru the  Main Entrance and check in at the main information desk  You need to re-schedule your appointment should you arrive 10 or more minutes late.  We strive to give you quality time with our providers, and arriving late affects you and other patients whose appointments are after yours.  Also, if you no show three or more times for appointments you may be dismissed from the clinic at the providers discretion.     Again, thank you for choosing [facility].  Our hope is that these requests will decrease the amount of time that you wait before being seen by our physicians.       _____________________________________________________________  Should you have questions after your visit to [facility], please contact our office at [phone] between the hours of 8:30 a.m. and 4:30 p.m.  Voicemails left after 4:30 p.m. will not be returned until the following business day.  For prescription refill requests, have your pharmacy contact our office.         Resources For Cancer Patients and their Caregivers ? American Cancer Society: Can assist with transportation, wigs, general needs, runs Look Good Feel Better.        1-888-227-6333 ? Cancer Care: Provides financial assistance, online support groups, medication/co-pay assistance.  1-800-813-HOPE  (4673) ? [facility] Assists [place] cancer patients and their families through emotional , educational and financial support.  336-427-4357 ? [place] DSS Where to apply for food stamps, Medicaid and utility assistance. 336-342-1394 ? [facility]: Transportation to medical appointments. 336-347-2287 ? Social Security Administration: May apply for disability if have a Stage IV cancer. 336-342-7796 1-800-772-1213 ? [place] Aging, Disability and Transit Services: Assists with nutrition, care and transit needs. 336-349-2343  Cancer Center Support Programs: @[email or web address]@ > Cancer Support Group  2nd Tuesday of the month 1pm-2pm, Journey Room  > Creative Journey  3rd Tuesday of the month 1130am-1pm, Journey Room  > Look Good Feel Better  1st Wednesday of the month 10am-12 noon, Journey Room (Call American Cancer Society to register 1-800-395-5775)    

## 2016-08-22 ENCOUNTER — Other Ambulatory Visit (HOSPITAL_COMMUNITY): Payer: Self-pay | Admitting: *Deleted

## 2016-08-22 DIAGNOSIS — D500 Iron deficiency anemia secondary to blood loss (chronic): Secondary | ICD-10-CM

## 2016-08-26 ENCOUNTER — Encounter (HOSPITAL_COMMUNITY): Payer: Medicare Other | Attending: Hematology & Oncology

## 2016-08-26 ENCOUNTER — Other Ambulatory Visit (HOSPITAL_COMMUNITY): Payer: Self-pay | Admitting: Oncology

## 2016-08-26 ENCOUNTER — Other Ambulatory Visit (HOSPITAL_COMMUNITY): Payer: Self-pay

## 2016-08-26 DIAGNOSIS — D500 Iron deficiency anemia secondary to blood loss (chronic): Secondary | ICD-10-CM | POA: Diagnosis not present

## 2016-08-26 LAB — CBC WITH DIFFERENTIAL/PLATELET
Basophils Absolute: 0 10*3/uL (ref 0.0–0.1)
Basophils Relative: 0 %
EOS ABS: 0.1 10*3/uL (ref 0.0–0.7)
Eosinophils Relative: 3 %
HCT: 32.6 % — ABNORMAL LOW (ref 36.0–46.0)
Hemoglobin: 10.4 g/dL — ABNORMAL LOW (ref 12.0–15.0)
LYMPHS ABS: 1.4 10*3/uL (ref 0.7–4.0)
Lymphocytes Relative: 30 %
MCH: 30.1 pg (ref 26.0–34.0)
MCHC: 31.9 g/dL (ref 30.0–36.0)
MCV: 94.2 fL (ref 78.0–100.0)
MONOS PCT: 9 %
Monocytes Absolute: 0.4 10*3/uL (ref 0.1–1.0)
NEUTROS PCT: 58 %
Neutro Abs: 2.8 10*3/uL (ref 1.7–7.7)
Platelets: 346 10*3/uL (ref 150–400)
RBC: 3.46 MIL/uL — ABNORMAL LOW (ref 3.87–5.11)
RDW: 14.7 % (ref 11.5–15.5)
WBC: 4.8 10*3/uL (ref 4.0–10.5)

## 2016-08-26 LAB — COMPREHENSIVE METABOLIC PANEL
ALBUMIN: 3.9 g/dL (ref 3.5–5.0)
ALK PHOS: 58 U/L (ref 38–126)
ALT: 11 U/L — AB (ref 14–54)
ANION GAP: 7 (ref 5–15)
AST: 22 U/L (ref 15–41)
BUN: 14 mg/dL (ref 6–20)
CALCIUM: 9.1 mg/dL (ref 8.9–10.3)
CO2: 25 mmol/L (ref 22–32)
CREATININE: 0.83 mg/dL (ref 0.44–1.00)
Chloride: 105 mmol/L (ref 101–111)
GFR calc Af Amer: >60 mL/min (ref >60)
GFR calc non Af Amer: >60 mL/min (ref >60)
GLUCOSE: 89 mg/dL (ref 65–99)
Potassium: 4.1 mmol/L (ref 3.5–5.1)
SODIUM: 137 mmol/L (ref 135–145)
Total Bilirubin: 0.4 mg/dL (ref 0.3–1.2)
Total Protein: 7.4 g/dL (ref 6.5–8.1)

## 2016-08-26 LAB — FERRITIN: Ferritin: 38 ng/mL (ref 11–307)

## 2016-09-05 ENCOUNTER — Encounter (HOSPITAL_COMMUNITY): Payer: Medicare Other | Attending: Oncology

## 2016-09-05 VITALS — BP 119/81 | HR 61 | Temp 98.2°F | Resp 18

## 2016-09-05 DIAGNOSIS — D500 Iron deficiency anemia secondary to blood loss (chronic): Secondary | ICD-10-CM | POA: Diagnosis not present

## 2016-09-05 MED ORDER — SODIUM CHLORIDE 0.9 % IV SOLN
Freq: Once | INTRAVENOUS | Status: AC
  Administered 2016-09-05: 14:00:00 via INTRAVENOUS

## 2016-09-05 MED ORDER — FERUMOXYTOL INJECTION 510 MG/17 ML
510.0000 mg | Freq: Once | INTRAVENOUS | Status: AC
  Administered 2016-09-05: 510 mg via INTRAVENOUS
  Filled 2016-09-05: qty 17

## 2016-09-05 NOTE — Patient Instructions (Signed)
[facility] at [facility] Discharge Instructions  RECOMMENDATIONS MADE BY THE CONSULTANT AND ANY TEST RESULTS WILL BE SENT TO YOUR REFERRING PHYSICIAN.  Received [name] today. Follow-up as scheduled. Call clinic for any questions or concerns  Thank you for choosing [facility] at [facility] to provide your oncology and hematology care.  To afford each patient quality time with our provider, please arrive at least 15 minutes before your scheduled appointment time.   Beginning January 23rd 2017 lab work for the Cancer Center will be done in the  Main lab at [place] on 1st floor. If you have a lab appointment with the Cancer Center please come in thru the  Main Entrance and check in at the main information desk  You need to re-schedule your appointment should you arrive 10 or more minutes late.  We strive to give you quality time with our providers, and arriving late affects you and other patients whose appointments are after yours.  Also, if you no show three or more times for appointments you may be dismissed from the clinic at the providers discretion.     Again, thank you for choosing [facility].  Our hope is that these requests will decrease the amount of time that you wait before being seen by our physicians.       _____________________________________________________________  Should you have questions after your visit to [facility], please contact our office at [phone] between the hours of 8:30 a.m. and 4:30 p.m.  Voicemails left after 4:30 p.m. will not be returned until the following business day.  For prescription refill requests, have your pharmacy contact our office.         Resources For Cancer Patients and their Caregivers ? American Cancer Society: Can assist with transportation, wigs, general needs, runs Look Good Feel Better.        1-888-227-6333 ? Cancer Care: Provides financial  assistance, online support groups, medication/co-pay assistance.  1-800-813-HOPE (4673) ? [facility] Assists [place] cancer patients and their families through emotional , educational and financial support.  336-427-4357 ? [place] DSS Where to apply for food stamps, Medicaid and utility assistance. 336-342-1394 ? [employer]: Transportation to medical appointments. 336-347-2287 ? Social Security Administration: May apply for disability if have a Stage IV cancer. 336-342-7796 1-800-772-1213 ? [place] Aging, Disability and Transit Services: Assists with nutrition, care and transit needs. 336-349-2343  Cancer Center Support Programs: @10RELATIVEDAYS@ > Cancer Support Group  2nd Tuesday of the month 1pm-2pm, Journey Room  > Creative Journey  3rd Tuesday of the month 1130am-1pm, Journey Room  > Look Good Feel Better  1st Wednesday of the month 10am-12 noon, Journey Room (Call American Cancer Society to register 1-800-395-5775)   

## 2016-09-05 NOTE — Progress Notes (Signed)
Melody Mitchell tolerated [name] well without complaints or incident. Pt was given her Influenza vaccine earlier this month at her PCP's office. VSS upon discharge. Pt discharged self ambulatory in satisfactory condition

## 2016-09-13 ENCOUNTER — Encounter (HOSPITAL_COMMUNITY): Payer: Self-pay | Admitting: *Deleted

## 2016-09-13 ENCOUNTER — Emergency Department (HOSPITAL_COMMUNITY)
Admission: EM | Admit: 2016-09-13 | Discharge: 2016-09-13 | Disposition: A | Discharge status: Home / Self Care | Payer: Medicare Other | Attending: Emergency Medicine | Admitting: Emergency Medicine

## 2016-09-13 DIAGNOSIS — H6690 Otitis media, unspecified, unspecified ear: Secondary | ICD-10-CM

## 2016-09-13 DIAGNOSIS — I10 Essential (primary) hypertension: Secondary | ICD-10-CM | POA: Insufficient documentation

## 2016-09-13 DIAGNOSIS — Z79899 Other long term (current) drug therapy: Secondary | ICD-10-CM | POA: Insufficient documentation

## 2016-09-13 DIAGNOSIS — Z87891 Personal history of nicotine dependence: Secondary | ICD-10-CM | POA: Insufficient documentation

## 2016-09-13 DIAGNOSIS — H6691 Otitis media, unspecified, right ear: Secondary | ICD-10-CM | POA: Insufficient documentation

## 2016-09-13 DIAGNOSIS — H9201 Otalgia, right ear: Secondary | ICD-10-CM | POA: Diagnosis present

## 2016-09-13 MED ORDER — AMOXICILLIN 500 MG PO CAPS
500.0000 mg | ORAL_CAPSULE | Freq: Three times a day (TID) | ORAL | 0 refills | Status: DC
Start: 2016-09-13 — End: 2016-11-28

## 2016-09-13 MED ORDER — IBUPROFEN 400 MG PO TABS
400.0000 mg | ORAL_TABLET | Freq: Once | ORAL | Status: AC
  Administered 2016-09-13: 400 mg via ORAL
  Filled 2016-09-13: qty 1

## 2016-09-13 MED ORDER — ACETAMINOPHEN 325 MG PO TABS
650.0000 mg | ORAL_TABLET | Freq: Once | ORAL | Status: AC
  Administered 2016-09-13: 650 mg via ORAL
  Filled 2016-09-13: qty 2

## 2016-09-13 MED ORDER — MECLIZINE HCL 25 MG PO TABS: 25.0000 mg | ORAL_TABLET | Freq: Three times a day (TID) | ORAL | 0 refills | Status: DC | PRN

## 2016-09-13 MED ORDER — TRIAMCINOLONE ACETONIDE 55 MCG/ACT NA AERO: 2.0000 | INHALATION_SPRAY | Freq: Every day | NASAL | 12 refills | Status: DC

## 2016-09-13 MED ORDER — AMOXICILLIN 250 MG PO CAPS
500.0000 mg | ORAL_CAPSULE | Freq: Once | ORAL | Status: AC
  Administered 2016-09-13: 500 mg via ORAL
  Filled 2016-09-13: qty 2

## 2016-09-13 MED ORDER — MECLIZINE HCL 12.5 MG PO TABS
25.0000 mg | ORAL_TABLET | Freq: Once | ORAL | Status: AC
  Administered 2016-09-13: 25 mg via ORAL
  Filled 2016-09-13: qty 2

## 2016-09-13 NOTE — ED Provider Notes (Signed)
[facility] DEPT Provider Note   CSN: [id number] Arrival date & time: 09/13/16  0251  Time seen 03:40 AM   History   Chief Complaint Chief Complaint  Patient presents with  . Otalgia    HPI Melody Mitchell is a 75 y.o. female.  HPI   Patient states she started getting pain in her right ear that shoots up into her head that started last night about 9:30 PM. She states the pain comes and goes and lasts for a few seconds. Nothing she does makes it better, nothing she does makes it feel worse. She denies taking any medications to me. A drainage from the ear, loss of hearing, or dizziness but she states she does feel little off balance. She denies spinning, nausea or vomiting. She denies fever. She does describe some nasal stuffiness. She denies having chronic problems of her ears.  PCP [facility]  Past Medical History:  Diagnosis Date  . Acid reflux   . Arthritis   . 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 Bronchitis january 2014  . Hiatal hernia   . High cholesterol   . History of contact dermatitis   . Hx: recurrent pneumonia   . Hypertension   . IDA (iron deficiency anemia)    chronic iron infusions  . Reflux   . Vertigo     Patient Active Problem List   Diagnosis Date Noted  . Heme positive stool 06/10/2015  . Change in bowel habits 06/10/2015  . Abdominal pain, epigastric 06/10/2015  . High cholesterol   . Acid reflux   . GERD 01/03/2011  . HEMORRHOIDS 04/15/2010  . Iron deficiency anemia due to chronic blood loss 01/12/2010  . 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 ABDOMINAL HYSTERECTOMY    . APPENDECTOMY    . BILATERAL BREAST BIOPSIES  BENIGN    . CHOLECYSTECTOMY    . COLONOSCOPY N/A 06/29/2015   SLF: The left colon is redundant 2. four colon polyps removed. No source  for change in bowel habits identified 3. Rectal bleeding  due to large internal hemorrhoids   . COLONOSCOPY W/ BIOPSIES  6 2010   Dr. Oneida Alar: Polypoid appearing lesion of the ascending colon, 3 mm sessile rectal polyp, small internal hemorrhoids. Pathology revealed polypoid mucosa, no adenomatous changes  . Double-balloon enteroscopy, antegrade  April 2011   Dr. 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  Marland Kitchen ESOPHAGOGASTRODUODENOSCOPY  04/2009   Dr. Oneida Alar: Patent Schatzki ring dilated with advancing the scope, patchy erythema in the antrum, 2 small AVMs in the duodenal bulb, 2 additional AVMs noted in the second portion the duodenum, mild gastritis on pathology  . ESOPHAGOGASTRODUODENOSCOPY N/A 06/29/2015   SLF: 1. 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 small hiatal hernia 3. Mild non-erosive gastririts- NO obvious source for dyspepsia identified.   Marland Kitchen EYE SURGERY      OB History    No data available       Home Medications    Prior to Admission medications   Medication Sig Start Date End Date Taking? Authorizing Provider  acetaminophen (TYLENOL) 500 MG tablet Take 500-1,000 mg by mouth every 6 (six) hours as needed for mild pain.    Yes Historical Provider, MD  amitriptyline (ELAVIL) 25 MG tablet Take 25 mg by mouth at bedtime as needed for sleep.    Yes Historical Provider, MD  celecoxib (CELEBREX) 100 MG capsule Take 100 mg by mouth daily.   Yes Historical Provider, MD  Cetirizine HCl 10 MG CAPS Take 1 capsule by mouth daily.   Yes Historical Provider, MD  docusate sodium (COLACE) 100 MG capsule Take 100 mg by mouth daily as needed for mild constipation or moderate constipation.    Yes Historical Provider, MD  escitalopram (LEXAPRO) 10 MG tablet Take 10 mg by mouth daily. 10/25/13  Yes Historical Provider, MD  esomeprazole (NEXIUM) 40 MG capsule Take 40 mg by mouth daily before breakfast.     Yes Historical Provider, MD  fish oil-omega-3 fatty acids 1000 MG capsule Take 1 g by mouth daily.    Yes Historical  Provider, MD  fluticasone (FLONASE) 50 MCG/ACT nasal spray Place 2 sprays into both nostrils daily.  04/11/13  Yes Historical Provider, MD  methylcellulose (ARTIFICIAL TEARS) 1 % ophthalmic solution Place 1 drop into both eyes 2 (two) times daily as needed (dry eyes).   Yes Historical Provider, MD  montelukast (SINGULAIR) 10 MG tablet Take 10 mg by mouth every evening. 01/27/16  Yes Historical Provider, MD  Multiple Vitamin (MULTIVITAMIN) capsule Take 1 capsule by mouth daily.     Yes Historical Provider, MD  potassium chloride SA (K-DUR,KLOR-CON) 20 MEQ tablet Take 20 mEq by mouth daily.   Yes Historical Provider, MD  prednisoLONE acetate (PRED FORTE) 1 % ophthalmic suspension  05/12/16  Yes Historical Provider, MD  Simethicone 125 MG CAPS Take 1 capsule (125 mg total) by mouth as needed (max 6 capsules per 24 hours). 06/05/15  Yes Orpah Greek, MD  traMADol (ULTRAM) 50 MG tablet Take 50 mg by mouth every 6 (six) hours as needed. For pain   Yes Historical Provider, MD  triamcinolone ointment (KENALOG) 0.1 % Apply 1 application topically as needed (rash).  02/26/14  Yes Historical Provider, MD  ZETIA 10 MG tablet Take 10 mg by mouth every morning. 11/30/15  Yes Historical Provider, MD  zolpidem (AMBIEN) 10 MG tablet Take 10 mg by mouth at bedtime as needed for sleep.  02/26/14  Yes Historical Provider, MD  amoxicillin (AMOXIL) 500 MG capsule Take 1 capsule (500 mg total) by mouth 3 (three) times daily. 09/13/16   Rolland Porter, MD  meclizine (ANTIVERT) 25 MG tablet Take 1 tablet (25 mg total) by mouth 3 (three) times daily as needed for dizziness. 09/13/16   Rolland Porter, MD  triamcinolone (NASACORT AQ) 55 MCG/ACT AERO nasal inhaler Place 2 sprays into the nose daily. 09/13/16   Rolland Porter, MD    Family History Family History  Problem Relation Age of Onset  . Heart failure Mother   . Diabetes Sister   . Colon cancer Neg Hx     Social History Social History  Substance Use Topics  . Smoking  status: Former Smoker    Packs/day: 1.00    Years: 25.00    Types: Cigarettes    Quit date: 09/14/2007  . Smokeless tobacco: Never Used  . Alcohol use No  lives at home Lives alone Drinks on holidays   Allergies   Sulfonamide derivatives and Latex   Review of Systems Review of Systems  All other systems reviewed and are negative.    Physical Exam Updated Vital Signs BP 124/86 (BP Location: Left Arm)   Pulse 79   Temp 98.7 F (37.1 C) (Oral)   Resp 20   Ht 5\' 4"  (1.626 m)   Wt 154 lb (69.9 kg)   SpO2 97%  BMI 26.43 kg/m   Vital signs normal    Physical Exam  Constitutional: She is oriented to person, place, and time. She appears well-developed and well-nourished.  Non-toxic appearance. She does not appear ill. No distress.  Patient will be sitting and gets little sharp jabs where she jumps on the stretcher.  HENT:  Head: Normocephalic and atraumatic.  Right Ear: External ear normal.  Left Ear: Tympanic membrane, external ear and ear canal normal.  Nose: Nose normal. No mucosal edema or rhinorrhea.  Mouth/Throat: Oropharynx is clear and moist and mucous membranes are normal. No dental abscesses or uvula swelling.  Patient has white solid-looking fluid behind her right TM. The ear canal appears normal.  Eyes: Conjunctivae and EOM are normal. Pupils are equal, round, and reactive to light.  Neck: Normal range of motion and full passive range of motion without pain. Neck supple.  Cardiovascular: Normal rate.   Pulmonary/Chest: No respiratory distress. She has no rhonchi. She exhibits no crepitus.  Abdominal: Normal appearance.  Musculoskeletal: Normal range of motion.  Moves all extremities well.   Neurological: She is alert and oriented to person, place, and time. She has normal strength. No cranial nerve deficit.  Skin: Skin is warm, dry and intact. No rash noted. No erythema. No pallor.  Psychiatric: She has a normal mood and affect. Her speech is normal and  behavior is normal. Her mood appears not anxious.  Nursing note and vitals reviewed.    ED Treatments / Results   Procedures Procedures (including critical care time)  Medications Ordered in ED Medications  ibuprofen (ADVIL,MOTRIN) tablet 400 mg (400 mg Oral Given 09/13/16 0414)  acetaminophen (TYLENOL) tablet 650 mg (650 mg Oral Given 09/13/16 0414)  amoxicillin (AMOXIL) capsule 500 mg (500 mg Oral Given 09/13/16 0414)  meclizine (ANTIVERT) tablet 25 mg (25 mg Oral Given 09/13/16 0414)     Initial Impression / Assessment and Plan / ED Course  I have reviewed the triage vital signs and the nursing notes.  Pertinent labs & imaging results that were available during my care of the patient were reviewed by me and considered in my medical decision making (see chart for details).  Clinical Course   Patient appears to have otitis media or some type of white fluid behind her ear or purulence. She complains of nasal stuffiness. She relates this to her allergies. She was started on a steroid nasal spray to help her eustachian tube to drain the fluid behind her eardrum. She was given meclizine for the feeling of being off balance. She was started on an antibiotic, amoxicillin for her infection. She told me she's taking no medications for her pain so she can take Motrin and acetaminophen.  Laboratory results from October 13 BUN 14, creatinine 0.83  Review of the [place] shows patient received #30 tramadol on September 28.  Final Clinical Impressions(s) / ED Diagnoses   Final diagnoses:  Acute otitis media, unspecified otitis media type    New Prescriptions New Prescriptions   AMOXICILLIN (AMOXIL) 500 MG CAPSULE    Take 1 capsule (500 mg total) by mouth 3 (three) times daily.   MECLIZINE (ANTIVERT) 25 MG TABLET    Take 1 tablet (25 mg total) by mouth 3 (three) times daily as needed for dizziness.   TRIAMCINOLONE (NASACORT AQ) 55 MCG/ACT AERO NASAL INHALER    Place 2  sprays into the nose daily.    Plan discharge  Rolland Porter, MD, Barbette Or, MD  09/13/16 0423  

## 2016-09-13 NOTE — ED Notes (Signed)
Pt alert & oriented x4, stable gait. Patient given discharge instructions, paperwork & prescription(s). Patient  instructed to stop at the registration desk to finish any additional paperwork. Patient verbalized understanding. Pt left department w/ no further questions. 

## 2016-09-13 NOTE — Discharge Instructions (Signed)
Take the medications as prescribed. You can take ibuprofen 400 mg + acetaminophen 650 mg every 6 hrs as needed for pain. Recheck if you aren't improving over the next several days, you get drainage from the ear, hearing loss, fever, or swelling around the ear.

## 2016-09-13 NOTE — ED Triage Notes (Signed)
Pt complaining of right ear pain that started yesterday & has gotten worse tonight. Pt took tylenol last night for pain.

## 2016-11-05 NOTE — Progress Notes (Signed)
REVIEWED.  

## 2016-11-25 ENCOUNTER — Other Ambulatory Visit (HOSPITAL_COMMUNITY): Payer: Self-pay | Admitting: *Deleted

## 2016-11-25 DIAGNOSIS — D500 Iron deficiency anemia secondary to blood loss (chronic): Secondary | ICD-10-CM

## 2016-11-27 NOTE — Progress Notes (Signed)
[facility] [address] [place] [address]  Iron deficiency anemia due to chronic blood loss  Hypokalemia - Plan: potassium chloride SA (K-DUR,KLOR-CON) 20 MEQ tablet, DISCONTINUED: potassium chloride SA (K-DUR,KLOR-CON) 20 MEQ tablet  Preventative health care - Plan: MM SCREENING BREAST TOMO BILATERAL  CURRENT THERAPY: IV iron as indicated.  INTERVAL HISTORY: Melody Mitchell 76 y.o. female returns for followup of iron deficiency anemia requiring IV [name] with failure of PO iron maintaining iron stores secondary to AVMs.   She is doing well.  She denies any issues today.  She denies any blood in her stool or dark stool.    She denies any new issues.  Review of Systems  Constitutional: Negative.  Negative for chills, fever and weight loss.  HENT: Negative.   Eyes: Negative.   Respiratory: Negative.  Negative for cough and hemoptysis.   Cardiovascular: Negative.  Negative for chest pain.  Gastrointestinal: Negative.  Negative for blood in stool and melena.  Genitourinary: Negative.   Musculoskeletal: Negative.   Skin: Negative.   Neurological: Negative.  Negative for weakness.  Endo/Heme/Allergies: Negative.   Psychiatric/Behavioral: Negative.     Past Medical History:  Diagnosis Date  . Acid reflux   . Arthritis   . 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 Bronchitis january 2014  . Hiatal hernia   . High cholesterol   . History of contact dermatitis   . Hx: recurrent pneumonia   . Hypertension   . IDA (iron deficiency anemia)    chronic iron infusions  . Reflux   . Vertigo     Past Surgical History:  Procedure Laterality Date  . ABDOMINAL HYSTERECTOMY    . APPENDECTOMY    . BILATERAL BREAST BIOPSIES  BENIGN    . CHOLECYSTECTOMY    . COLONOSCOPY N/A 06/29/2015   SLF: The left colon is redundant 2. four colon polyps removed. No source  for change in bowel habits identified 3. Rectal bleeding  due to large internal hemorrhoids   . COLONOSCOPY W/ BIOPSIES  6 2010   Dr. Oneida Alar: Polypoid appearing lesion of the ascending colon, 3 mm sessile rectal polyp, small internal hemorrhoids. Pathology revealed polypoid mucosa, no adenomatous changes  . Double-balloon enteroscopy, antegrade  April 2011   Dr. 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  Marland Kitchen ESOPHAGOGASTRODUODENOSCOPY  04/2009   Dr. Oneida Alar: Patent Schatzki ring dilated with advancing the scope, patchy erythema in the antrum, 2 small AVMs in the duodenal bulb, 2 additional AVMs noted in the second portion the duodenum, mild gastritis on pathology  . ESOPHAGOGASTRODUODENOSCOPY N/A 06/29/2015   SLF: 1. 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 small hiatal hernia 3. Mild non-erosive gastririts- NO obvious source for dyspepsia identified.   Marland Kitchen EYE SURGERY      Family History  Problem Relation Age of Onset  . Heart failure Mother   . Diabetes Sister   . Colon cancer Neg Hx     Social History   Social History  . Marital status: Divorced    Spouse name: N/A  . Number of children: 1  . Years of education: N/A   Social History Main Topics  . Smoking status: Former Smoker    Packs/day: 1.00    Years: 25.00    Types: Cigarettes    Quit date: 09/14/2007  . Smokeless tobacco: Never Used  . Alcohol use No  .  Drug use: No  . Sexual activity: Yes    Birth control/ protection: Post-menopausal, Surgical   Other Topics Concern  . None   Social History Narrative  . None     PHYSICAL EXAMINATION  ECOG PERFORMANCE STATUS: 0 - Asymptomatic  Vitals:   11/28/16 1050  BP: 129/76  Pulse: 71  Resp: 18  Temp: 98.5 F (36.9 C)    GENERAL:alert, no distress, well nourished, well developed, comfortable, cooperative, smiling and unaccompanied SKIN: skin color, texture, turgor are normal, no rashes or significant lesions HEAD: Normocephalic, No masses, lesions, tenderness or abnormalities EYES:  normal, EOMI, Conjunctiva are pink and non-injected EARS: External ears normal OROPHARYNX:lips, buccal mucosa, and tongue normal and mucous membranes are moist  NECK: supple, trachea midline LYMPH:  no palpable lymphadenopathy BREAST:not examined LUNGS: clear to auscultation and percussion HEART: regular rate & rhythm, no murmurs, no gallops, S1 normal and S2 normal ABDOMEN:abdomen soft and normal bowel sounds BACK: Back symmetric, no curvature. EXTREMITIES:less then 2 second capillary refill, no joint deformities, effusion, or inflammation, no skin discoloration, no cyanosis  NEURO: alert & oriented x 3 with fluent speech, no focal motor/sensory deficits, gait normal   LABORATORY DATA: CBC    Component Value Date/Time   WBC 5.0 11/28/2016 0906   RBC 3.66 (L) 11/28/2016 0906   HGB 10.8 (L) 11/28/2016 0906   HCT 33.2 (L) 11/28/2016 0906   PLT 311 11/28/2016 0906   MCV 90.7 11/28/2016 0906   MCH 29.5 11/28/2016 0906   MCHC 32.5 11/28/2016 0906   RDW 14.5 11/28/2016 0906   LYMPHSABS 1.7 11/28/2016 0906   MONOABS 0.4 11/28/2016 0906   EOSABS 0.1 11/28/2016 0906   BASOSABS 0.0 11/28/2016 0906      Chemistry      Component Value Date/Time   NA 138 11/28/2016 0906   K 3.2 (L) 11/28/2016 0906   CL 103 11/28/2016 0906   CO2 28 11/28/2016 0906   BUN 13 11/28/2016 0906   CREATININE 0.91 11/28/2016 0906      Component Value Date/Time   CALCIUM 9.1 11/28/2016 0906   ALKPHOS 62 11/28/2016 0906   AST 25 11/28/2016 0906   ALT 22 11/28/2016 0906   BILITOT 0.2 (L) 11/28/2016 0906     Lab Results  Component Value Date   IRON 43 05/27/2016   TIBC 286 05/27/2016   FERRITIN 38 08/26/2016     PENDING LABS:   RADIOGRAPHIC STUDIES:  No results found.   PATHOLOGY:    ASSESSMENT AND PLAN:  Iron deficiency anemia due to chronic blood loss Iron deficiency anemia requiring IV [name] with failure of PO iron maintaining iron stores secondary to AVMs. Additionally, she is on  PPI therapy for GERD which also may be a contributing factor resulting in malabsorption.  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  S/P EGD/Colonoscopy by Dr. Oneida Alar on 06/29/2015 without any evidence of dysplasia or malignancy, but noted chronic inactive gastritis, four colon polyps negative for malignancy, and large internal bleeding hemorrhoids.  She will be due for another EGD/Colonoscopy in 2026 depending on risk:benfit ratio.  Labs today and 3 months: CBC diff, CMET, iron/TIBC, ferritin.  I personally reviewed and went over laboratory results with the patient.  The results are noted within this dictation.  Hypokalemia noted.  I have escribed Kdur 20 meq BID x 30 days.  This can be managed by her primary care provider moving forward.  I personally reviewed and went over radiographic studies with the  patient.  The results are noted within this dictation.  Mammogram in April 2017 was negative.  She will be due for her next mammogram in April 2018.    Order is placed for mammogram in April 2018.  Labs in 6 months: CBC diff, CMET, iron/TIBC, ferritin.  Return in 6 months for follow-up.   ORDERS PLACED FOR THIS ENCOUNTER: Orders Placed This Encounter  Procedures  . MM SCREENING BREAST TOMO BILATERAL    MEDICATIONS PRESCRIBED THIS ENCOUNTER: Meds ordered this encounter  Medications  . traMADol (ULTRAM) 50 MG tablet    Sig: Take 50 mg by mouth.  . meclizine (ANTIVERT) 25 MG tablet    Sig: Take 25 mg by mouth.  . fluticasone (FLONASE) 50 MCG/ACT nasal spray    Sig: 1 spray by Each Nare route daily.  Marland Kitchen triamcinolone cream (KENALOG) 0.1 %  . aspirin 81 MG chewable tablet    Sig: Chew 81 mg by mouth.  . brimonidine-timolol (COMBIGAN) 0.2-0.5 % ophthalmic solution    Sig: Administer 1 drop into the left eye every twelve (12) hours.  . dorzolamide-timolol (COSOPT) 22.3-6.8 MG/ML ophthalmic solution    Sig: Administer 1 drop into the left eye  Two (2) times a day.  Marland Kitchen DISCONTD: potassium chloride SA (K-DUR,KLOR-CON) 20 MEQ tablet    Sig: Take 2 tablets (40 mEq total) by mouth daily.    Dispense:  60 tablet    Refill:  0    Future refills from primary care provider (if needed)    Order Specific Question:   Supervising Provider    Answer:   Patrici Ranks [id number]  . potassium chloride SA (K-DUR,KLOR-CON) 20 MEQ tablet    Sig: Take 2 tablets (40 mEq total) by mouth daily.    Dispense:  60 tablet    Refill:  0    Future refills from primary care provider (if needed)    Order Specific Question:   Supervising Provider    Answer:   Patrici Ranks [id number]    THERAPY PLAN:  Continue with labs every 3 months and administer IV iron when indicated.   All questions were answered. The patient knows to call the clinic with any problems, questions or concerns. We can certainly see the patient much sooner if necessary.  Patient and plan discussed with Dr. Ancil Linsey and she is in agreement with the aforementioned.   This note is electronically signed by: Doy Mince 11/28/2016 12:35 PM

## 2016-11-27 NOTE — Assessment & Plan Note (Addendum)
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 Additionally, she is on PPI therapy for GERD which also may be a contributing factor resulting in malabsorption.  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  S/P EGD/Colonoscopy by Dr. Oneida Alar on 06/29/2015 without any evidence of dysplasia or malignancy, but noted chronic inactive gastritis, four colon polyps negative for malignancy, and large internal bleeding hemorrhoids.  She will be due for another EGD/Colonoscopy in 2026 depending on risk:benfit ratio.  Labs today and 3 months: CBC diff, CMET, iron/TIBC, ferritin.  I personally reviewed and went over laboratory results with the patient.  The results are noted within this dictation.  Hypokalemia noted.  I have escribed Kdur 20 meq BID x 30 days.  This can be managed by her primary care provider moving forward.  I personally reviewed and went over radiographic studies with the patient.  The results are noted within this dictation.  Mammogram in April 2017 was negative.  She will be due for her next mammogram in April 2018.    Order is placed for mammogram in April 2018.  Labs in 6 months: CBC diff, CMET, iron/TIBC, ferritin.  Return in 6 months for follow-up.

## 2016-11-28 ENCOUNTER — Encounter (HOSPITAL_COMMUNITY): Payer: Medicare Other

## 2016-11-28 ENCOUNTER — Other Ambulatory Visit (HOSPITAL_COMMUNITY): Payer: Self-pay | Admitting: Oncology

## 2016-11-28 ENCOUNTER — Encounter (HOSPITAL_COMMUNITY): Payer: Medicare Other | Attending: Oncology | Admitting: Oncology

## 2016-11-28 ENCOUNTER — Encounter (HOSPITAL_COMMUNITY): Payer: Self-pay | Admitting: Oncology

## 2016-11-28 VITALS — BP 129/76 | HR 71 | Temp 98.5°F | Resp 18 | Wt 157.0 lb

## 2016-11-28 DIAGNOSIS — Z0000 Encounter for general adult medical examination without abnormal findings: Secondary | ICD-10-CM

## 2016-11-28 DIAGNOSIS — D500 Iron deficiency anemia secondary to blood loss (chronic): Secondary | ICD-10-CM | POA: Diagnosis not present

## 2016-11-28 DIAGNOSIS — Z1231 Encounter for screening mammogram for malignant neoplasm of breast: Secondary | ICD-10-CM

## 2016-11-28 DIAGNOSIS — K219 Gastro-esophageal reflux disease without esophagitis: Secondary | ICD-10-CM

## 2016-11-28 DIAGNOSIS — E876 Hypokalemia: Secondary | ICD-10-CM | POA: Diagnosis not present

## 2016-11-28 LAB — CBC WITH DIFFERENTIAL/PLATELET
BASOS ABS: 0 10*3/uL (ref 0.0–0.1)
Basophils Relative: 0 %
Eosinophils Absolute: 0.1 10*3/uL (ref 0.0–0.7)
Eosinophils Relative: 2 %
HEMATOCRIT: 33.2 % — AB (ref 36.0–46.0)
HEMOGLOBIN: 10.8 g/dL — AB (ref 12.0–15.0)
LYMPHS ABS: 1.7 10*3/uL (ref 0.7–4.0)
Lymphocytes Relative: 35 %
MCH: 29.5 pg (ref 26.0–34.0)
MCHC: 32.5 g/dL (ref 30.0–36.0)
MCV: 90.7 fL (ref 78.0–100.0)
Monocytes Absolute: 0.4 10*3/uL (ref 0.1–1.0)
Monocytes Relative: 8 %
NEUTROS ABS: 2.7 10*3/uL (ref 1.7–7.7)
Neutrophils Relative %: 55 %
Platelets: 311 10*3/uL (ref 150–400)
RBC: 3.66 MIL/uL — AB (ref 3.87–5.11)
RDW: 14.5 % (ref 11.5–15.5)
WBC: 5 10*3/uL (ref 4.0–10.5)

## 2016-11-28 LAB — COMPREHENSIVE METABOLIC PANEL
ALK PHOS: 62 U/L (ref 38–126)
ALT: 22 U/L (ref 14–54)
AST: 25 U/L (ref 15–41)
Albumin: 3.7 g/dL (ref 3.5–5.0)
Anion gap: 7 (ref 5–15)
BILIRUBIN TOTAL: 0.2 mg/dL — AB (ref 0.3–1.2)
BUN: 13 mg/dL (ref 6–20)
CALCIUM: 9.1 mg/dL (ref 8.9–10.3)
CHLORIDE: 103 mmol/L (ref 101–111)
CO2: 28 mmol/L (ref 22–32)
CREATININE: 0.91 mg/dL (ref 0.44–1.00)
GFR, EST NON AFRICAN AMERICAN: 59 mL/min — AB (ref >60)
Glucose, Bld: 88 mg/dL (ref 65–99)
Potassium: 3.2 mmol/L — ABNORMAL LOW (ref 3.5–5.1)
Sodium: 138 mmol/L (ref 135–145)
Total Protein: 7.2 g/dL (ref 6.5–8.1)

## 2016-11-28 LAB — IRON AND TIBC
IRON: 35 ug/dL (ref 28–170)
Saturation Ratios: 12 % (ref 10.4–31.8)
TIBC: 304 ug/dL (ref 250–450)
UIBC: 269 ug/dL

## 2016-11-28 LAB — FERRITIN: Ferritin: 23 ng/mL (ref 11–307)

## 2016-11-28 MED ORDER — POTASSIUM CHLORIDE CRYS ER 20 MEQ PO TBCR: 40.0000 meq | EXTENDED_RELEASE_TABLET | Freq: Every day | ORAL | 0 refills | Status: DC

## 2016-11-28 NOTE — Patient Instructions (Signed)
[place] at [facility] Discharge Instructions  RECOMMENDATIONS MADE BY THE CONSULTANT AND ANY TEST RESULTS WILL BE SENT TO YOUR REFERRING PHYSICIAN.  Labs today are stable.  Potassium is a little low.  I have called in a prescription for potassium pills.  Your iron tests are pending and we will call you later with those results. Repeat labs in 3 and 6 months. Return in 6 months for follow-up. Please call us with any questions or concerns.  Thank you for choosing [place] at [facility] to provide your oncology and hematology care.  To afford each patient quality time with our provider, please arrive at least 15 minutes before your scheduled appointment time.    If you have a lab appointment with the [facility] please come in thru the  Main Entrance and check in at the main information desk  You need to re-schedule your appointment should you arrive 10 or more minutes late.  We strive to give you quality time with our providers, and arriving late affects you and other patients whose appointments are after yours.  Also, if you no show three or more times for appointments you may be dismissed from the clinic at the providers discretion.     Again, thank you for choosing [facility].  Our hope is that these requests will decrease the amount of time that you wait before being seen by our physicians.       _____________________________________________________________  Should you have questions after your visit to [facility], please contact our office at [phone] between the hours of 8:30 a.m. and 4:30 p.m.  Voicemails left after 4:30 p.m. will not be returned until the following business day.  For prescription refill requests, have your pharmacy contact our office.       Resources For Cancer Patients and their Caregivers ? American Cancer Society: Can assist with transportation, wigs, general needs, runs Look  Good Feel Better.        862-872-9444 ? Cancer Care: Provides financial assistance, online support groups, medication/co-pay assistance.  1-800-813-HOPE 239-259-7079) ? [place] Assists [place] cancer patients and their families through emotional , educational and financial support.  (254)438-3563 ? [place] DSS Where to apply for food stamps, Medicaid and utility assistance. (913) 871-5576 ? [facility]: Transportation to medical appointments. 520-643-6329 ? Social Security Administration: May apply for disability if have a Stage IV cancer. (320)547-4533 587-421-5138 ? [employer], Disability and Transit Services: Assists with nutrition, care and transit needs. [place] Support Programs: @10RELATIVEDAYS @ > Cancer Support Group  2nd Tuesday of the month 1pm-2pm, Journey Room  > Creative Journey  3rd Tuesday of the month 1130am-1pm, Journey Room  > Look Good Feel Better  1st Wednesday of the month 10am-12 noon, Journey Room (Call [place] to register [phone])

## 2016-12-01 ENCOUNTER — Ambulatory Visit (HOSPITAL_COMMUNITY): Payer: Medicare Other

## 2016-12-07 ENCOUNTER — Encounter (HOSPITAL_BASED_OUTPATIENT_CLINIC_OR_DEPARTMENT_OTHER): Payer: Medicare Other

## 2016-12-07 VITALS — BP 115/64 | HR 65 | Temp 98.9°F | Resp 18

## 2016-12-07 DIAGNOSIS — D500 Iron deficiency anemia secondary to blood loss (chronic): Secondary | ICD-10-CM

## 2016-12-07 MED ORDER — SODIUM CHLORIDE 0.9 % IV SOLN
Freq: Once | INTRAVENOUS | Status: AC
  Administered 2016-12-07: 14:00:00 via INTRAVENOUS

## 2016-12-07 MED ORDER — SODIUM CHLORIDE 0.9 % IV SOLN
510.0000 mg | Freq: Once | INTRAVENOUS | Status: AC
  Administered 2016-12-07: 510 mg via INTRAVENOUS
  Filled 2016-12-07: qty 17

## 2016-12-07 NOTE — Patient Instructions (Signed)
[facility] at [facility] Discharge Instructions  RECOMMENDATIONS MADE BY THE CONSULTANT AND ANY TEST RESULTS WILL BE SENT TO YOUR REFERRING PHYSICIAN.  Received [name] infusion today.Follow-up as scheduled. Call clinic for any questions or concerns  Thank you for choosing [facility] at [facility] to provide your oncology and hematology care.  To afford each patient quality time with our provider, please arrive at least 15 minutes before your scheduled appointment time.    If you have a lab appointment with the Cancer Center please come in thru the  Main Entrance and check in at the main information desk  You need to re-schedule your appointment should you arrive 10 or more minutes late.  We strive to give you quality time with our providers, and arriving late affects you and other patients whose appointments are after yours.  Also, if you no show three or more times for appointments you may be dismissed from the clinic at the providers discretion.     Again, thank you for choosing [facility].  Our hope is that these requests will decrease the amount of time that you wait before being seen by our physicians.       _____________________________________________________________  Should you have questions after your visit to  Cancer Center, please contact our office at [phone] between the hours of 8:30 a.m. and 4:30 p.m.  Voicemails left after 4:30 p.m. will not be returned until the following business day.  For prescription refill requests, have your pharmacy contact our office.       Resources For Cancer Patients and their Caregivers ? American Cancer Society: Can assist with transportation, wigs, general needs, runs Look Good Feel Better.        1-888-227-6333 ? Cancer Care: Provides financial assistance, online support groups, medication/co-pay assistance.  1-800-813-HOPE (4673) ? [facility] Assists [place] cancer patients and their families through emotional , educational and financial support.  336-427-4357 ? [place] DSS Where to apply for food stamps, Medicaid and utility assistance. 336-342-1394 ? [employer]: Transportation to medical appointments. 336-347-2287 ? Social Security Administration: May apply for disability if have a Stage IV cancer. 336-342-7796 1-800-772-1213 ? [place] [facility], Disability and Transit Services: Assists with nutrition, care and transit needs. 336-349-2343  Cancer Center Support Programs: @10RELATIVEDAYS@ > Cancer Support Group  2nd Tuesday of the month 1pm-2pm, Journey Room  > Creative Journey  3rd Tuesday of the month 1130am-1pm, Journey Room  > Look Good Feel Better  1st Wednesday of the month 10am-12 noon, Journey Room (Call American Cancer Society to register 1-800-395-5775)   

## 2016-12-07 NOTE — Progress Notes (Signed)
Melody Mitchell tolerated [name] infusion well without complaints or incident. VSS upon discharge. Pt discharged self ambulatory in satisfactory condition accompanied by family member 

## 2016-12-28 ENCOUNTER — Emergency Department (HOSPITAL_COMMUNITY)
Admission: EM | Admit: 2016-12-28 | Discharge: 2016-12-28 | Disposition: A | Discharge status: Home / Self Care | Payer: Medicare Other | Attending: Emergency Medicine | Admitting: Emergency Medicine

## 2016-12-28 ENCOUNTER — Encounter (HOSPITAL_COMMUNITY): Payer: Self-pay | Admitting: Emergency Medicine

## 2016-12-28 ENCOUNTER — Other Ambulatory Visit: Payer: Self-pay

## 2016-12-28 ENCOUNTER — Emergency Department (HOSPITAL_COMMUNITY): Payer: Medicare Other

## 2016-12-28 DIAGNOSIS — R55 Syncope and collapse: Secondary | ICD-10-CM | POA: Diagnosis not present

## 2016-12-28 DIAGNOSIS — I10 Essential (primary) hypertension: Secondary | ICD-10-CM | POA: Diagnosis not present

## 2016-12-28 DIAGNOSIS — Z79899 Other long term (current) drug therapy: Secondary | ICD-10-CM | POA: Insufficient documentation

## 2016-12-28 DIAGNOSIS — Z87891 Personal history of nicotine dependence: Secondary | ICD-10-CM | POA: Insufficient documentation

## 2016-12-28 DIAGNOSIS — K529 Noninfective gastroenteritis and colitis, unspecified: Secondary | ICD-10-CM | POA: Insufficient documentation

## 2016-12-28 LAB — CBC
HCT: 40.2 % (ref 36.0–46.0)
HEMOGLOBIN: 13.1 g/dL (ref 12.0–15.0)
MCH: 29.6 pg (ref 26.0–34.0)
MCHC: 32.6 g/dL (ref 30.0–36.0)
MCV: 91 fL (ref 78.0–100.0)
Platelets: 269 10*3/uL (ref 150–400)
RBC: 4.42 MIL/uL (ref 3.87–5.11)
RDW: 15.3 % (ref 11.5–15.5)
WBC: 7.7 10*3/uL (ref 4.0–10.5)

## 2016-12-28 LAB — BASIC METABOLIC PANEL
ANION GAP: 10 (ref 5–15)
BUN: 9 mg/dL (ref 6–20)
CALCIUM: 9.5 mg/dL (ref 8.9–10.3)
CO2: 27 mmol/L (ref 22–32)
Chloride: 101 mmol/L (ref 101–111)
Creatinine, Ser: 1.09 mg/dL — ABNORMAL HIGH (ref 0.44–1.00)
GFR calc non Af Amer: 47 mL/min — ABNORMAL LOW (ref >60)
GFR, EST AFRICAN AMERICAN: 55 mL/min — AB (ref >60)
GLUCOSE: 126 mg/dL — AB (ref 65–99)
Potassium: 3.5 mmol/L (ref 3.5–5.1)
SODIUM: 138 mmol/L (ref 135–145)

## 2016-12-28 MED ORDER — ONDANSETRON 4 MG PO TBDP: 4.0000 mg | ORAL_TABLET | Freq: Three times a day (TID) | ORAL | 0 refills | Status: DC | PRN

## 2016-12-28 MED ORDER — SODIUM CHLORIDE 0.9 % IV BOLUS (SEPSIS)
1000.0000 mL | Freq: Once | INTRAVENOUS | Status: AC
  Administered 2016-12-28: 1000 mL via INTRAVENOUS

## 2016-12-28 MED ORDER — ACETAMINOPHEN 325 MG PO TABS
650.0000 mg | ORAL_TABLET | Freq: Once | ORAL | Status: AC
  Administered 2016-12-28: 650 mg via ORAL
  Filled 2016-12-28: qty 2

## 2016-12-28 NOTE — ED Notes (Signed)
Ambulatory to bathroom without difficulty.   

## 2016-12-28 NOTE — ED Provider Notes (Signed)
[facility] [place] DEPT Provider Note   CSN: [id number] Arrival date & time: 12/28/16  [id number]   By signing my name below, I, Melody Mitchell, attest that this documentation has been prepared under the direction and in the presence of Sherwood Gambler, MD. Electronically Signed: Hilbert Mitchell, Scribe. 12/28/16. 9:40 AM. History   Chief Complaint Chief Complaint  Patient presents with  . Near Syncope     The history is provided by the patient. No language interpreter was used.   HPI Comments: Melody Mitchell is a 76 y.o. female brought in by ambulance, who presents to the Emergency Department complaining of a near syncope episode that occurred earlier today. She states that she was sitting on the toilet earlier today having diarrhea when she felt like she was going to pass out. She states that she fell light-headed at that time which lasted for several minutes. The patient states that she has been feeling sick for the past 2 to 3 days. She reports going to [place] [facility] yesterday for nasal congestion and was no diagnosed with the flu at that time. She was given a prescription of Ampicillin which she has only taken 2 doses. She reports, congestion, cough, nausea, and vomiting. She reports her last episode of nausea and vomiting was this morning which appears to be resolved. Her cough has also resolved. She also reports having a headache which she rates 5/10. She denies weakness, blurry vision, sore throat, dysuria, blood in stool, CP, abdominal pain, and SOB.  Past Medical History:  Diagnosis Date  . Acid reflux   . Arthritis   . 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 Bronchitis january 2014  . Hiatal hernia   . High cholesterol   . History of contact dermatitis   . Hx: recurrent pneumonia   . Hypertension   . IDA (iron deficiency anemia)    chronic iron infusions  . Reflux   . Vertigo     Patient Active Problem List   Diagnosis Date Noted  . Heme  positive stool 06/10/2015  . Change in bowel habits 06/10/2015  . Abdominal pain, epigastric 06/10/2015  . High cholesterol   . Acid reflux   . GERD 01/03/2011  . HEMORRHOIDS 04/15/2010  . Iron deficiency anemia due to chronic blood loss 01/12/2010  . 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 ABDOMINAL HYSTERECTOMY    . APPENDECTOMY    . BILATERAL BREAST BIOPSIES  BENIGN    . CHOLECYSTECTOMY    . COLONOSCOPY N/A 06/29/2015   SLF: The left colon is redundant 2. four colon polyps removed. No source  for change in bowel habits identified 3. Rectal bleeding due to large internal hemorrhoids   . COLONOSCOPY W/ BIOPSIES  6 2010   Dr. Oneida Alar: Polypoid appearing lesion of the ascending colon, 3 mm sessile rectal polyp, small internal hemorrhoids. Pathology revealed polypoid mucosa, no adenomatous changes  . Double-balloon enteroscopy, antegrade  April 2011   Dr. 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  Marland Kitchen ESOPHAGOGASTRODUODENOSCOPY  04/2009   Dr. Oneida Alar: Patent Schatzki ring dilated with advancing the scope, patchy erythema in the antrum, 2 small AVMs in the duodenal bulb, 2 additional AVMs noted in the second portion the duodenum, mild gastritis on pathology  . ESOPHAGOGASTRODUODENOSCOPY N/A 06/29/2015   SLF: 1. 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 small hiatal hernia 3. Mild non-erosive gastririts-  NO obvious source for dyspepsia identified.   Marland Kitchen EYE SURGERY      OB History    No data available       Home Medications    Prior to Admission medications   Medication Sig Start Date End Date Taking? Authorizing Provider  acetaminophen (TYLENOL) 500 MG tablet Take 500-1,000 mg by mouth every 6 (six) hours as needed for mild pain.    Yes Historical Provider, MD  amitriptyline (ELAVIL) 25 MG tablet Take 25 mg by mouth at bedtime as needed for sleep.    Yes Historical Provider,  MD  brimonidine-timolol (COMBIGAN) 0.2-0.5 % ophthalmic solution Administer 1 drop into the left eye every twelve (12) hours. 06/21/16  Yes Historical Provider, MD  celecoxib (CELEBREX) 100 MG capsule Take 100 mg by mouth daily.   Yes Historical Provider, MD  Cetirizine HCl 10 MG CAPS Take 1 capsule by mouth daily.   Yes Historical Provider, MD  docusate sodium (COLACE) 100 MG capsule Take 100 mg by mouth daily as needed for mild constipation or moderate constipation.    Yes Historical Provider, MD  escitalopram (LEXAPRO) 10 MG tablet Take 10 mg by mouth daily as needed (depression).  10/25/13  Yes Historical Provider, MD  esomeprazole (NEXIUM) 40 MG capsule Take 40 mg by mouth daily before breakfast.     Yes Historical Provider, MD  fish oil-omega-3 fatty acids 1000 MG capsule Take 1 g by mouth daily.    Yes Historical Provider, MD  fluticasone (FLONASE) 50 MCG/ACT nasal spray Place 2 sprays into both nostrils daily.  04/11/13  Yes Historical Provider, MD  meclizine (ANTIVERT) 25 MG tablet Take 1 tablet (25 mg total) by mouth 3 (three) times daily as needed for dizziness. 09/13/16  Yes Rolland Porter, MD  methylcellulose (ARTIFICIAL TEARS) 1 % ophthalmic solution Place 1 drop into both eyes 2 (two) times daily as needed (dry eyes).   Yes Historical Provider, MD  Multiple Vitamin (MULTIVITAMIN) capsule Take 1 capsule by mouth daily.     Yes Historical Provider, MD  potassium chloride SA (K-DUR,KLOR-CON) 20 MEQ tablet Take 2 tablets (40 mEq total) by mouth daily. 11/28/16  Yes Baird Cancer, PA-C  Simethicone 125 MG CAPS Take 1 capsule (125 mg total) by mouth as needed (max 6 capsules per 24 hours). 06/05/15  Yes Orpah Greek, MD  traMADol (ULTRAM) 50 MG tablet Take 50 mg by mouth daily as needed for moderate pain.  09/24/13  Yes Historical Provider, MD  triamcinolone (NASACORT AQ) 55 MCG/ACT AERO nasal inhaler Place 2 sprays into the nose daily. 09/13/16  Yes Rolland Porter, MD  triamcinolone cream  (KENALOG) 0.1 % Apply 1 application topically daily as needed (rash).  11/24/16  Yes Historical Provider, MD  ZETIA 10 MG tablet Take 10 mg by mouth every morning. 11/30/15  Yes Historical Provider, MD  zolpidem (AMBIEN) 10 MG tablet Take 10 mg by mouth at bedtime as needed for sleep.  02/26/14  Yes Historical Provider, MD  ondansetron (ZOFRAN ODT) 4 MG disintegrating tablet Take 1 tablet (4 mg total) by mouth every 8 (eight) hours as needed for nausea or vomiting. 12/28/16   Sherwood Gambler, MD    Family History Family History  Problem Relation Age of Onset  . Heart failure Mother   . Diabetes Sister   . Colon cancer Neg Hx     Social History Social History  Substance Use Topics  . Smoking status: Former Smoker    Packs/day: 1.00  Years: 25.00    Types: Cigarettes    Quit date: 09/14/2007  . Smokeless tobacco: Never Used  . Alcohol use No     Allergies   Latex; Sulfa antibiotics; and Sulfonamide derivatives   Review of Systems Review of Systems  HENT: Positive for congestion. Negative for sore throat.   Respiratory: Negative for shortness of breath.   Cardiovascular: Negative for chest pain.  Gastrointestinal: Positive for diarrhea, nausea and vomiting. Negative for abdominal pain and blood in stool.  Genitourinary: Negative for dysuria.  Neurological: Positive for light-headedness.  All other systems reviewed and are negative.    Physical Exam Updated Vital Signs BP (!) 120/101   Pulse 86   Temp 98.6 F (37 C) (Oral)   Resp 16   Ht 5' 4.5" (1.638 m)   Wt 160 lb (72.6 kg)   SpO2 100%   BMI 27.04 kg/m   Physical Exam  Constitutional: She is oriented to person, place, and time. She appears well-developed and well-nourished.  HENT:  Head: Normocephalic and atraumatic.  Right Ear: External ear normal.  Left Ear: External ear normal.  Nose: Nose normal.  Mouth/Throat: Oropharynx is clear and moist.  Eyes: EOM are normal. Pupils are equal, round, and reactive  to light. Right eye exhibits no discharge. Left eye exhibits no discharge.  Cardiovascular: Normal rate, regular rhythm and normal heart sounds.   No murmur heard. Pulmonary/Chest: Effort normal and breath sounds normal.  Abdominal: Soft. There is no tenderness.  Neurological: She is alert and oriented to person, place, and time.  CN 3-12 grossly intact. 5/5 strength in all 4 extremities. Grossly normal sensation. Normal finger to nose.   Skin: Skin is warm and dry.  Nursing note and vitals reviewed.    ED Treatments / Results  DIAGNOSTIC STUDIES: Oxygen Saturation is 95% on RA, normal by my interpretation.    COORDINATION OF CARE: 9:16 AM Discussed treatment plan with pt at bedside and pt agreed to plan. I will check the patient's blood work. I will give the patient some IV fluids.  Labs (all labs ordered are listed, but only abnormal results are displayed) Labs Reviewed  BASIC METABOLIC PANEL - Abnormal; Notable for the following:       Result Value   Glucose, Bld 126 (*)    Creatinine, Ser 1.09 (*)    GFR calc non Af Amer 47 (*)    GFR calc Af Amer 55 (*)    All other components within normal limits  CBC    EKG  EKG Interpretation  Date/Time:  Wednesday December 28 2016 08:58:09 EST Ventricular Rate:  91 PR Interval:    QRS Duration: 90 QT Interval:  370 QTC Calculation: 456 R Axis:   -17 Text Interpretation:  Sinus rhythm Borderline left axis deviation Abnormal R-wave progression, early transition Borderline T abnormalities, anterior leads T wave changes similar to April 2017 Confirmed by Regenia Skeeter MD, Avni Traore [phone]) on 12/28/2016 9:04:04 AM       Radiology Dg Chest 2 View  Result Date: 12/28/2016 CLINICAL DATA:  Cough and nasal congestion EXAM: CHEST  2 VIEW COMPARISON:  06/09/2015 FINDINGS: The heart size and mediastinal contours are within normal limits. Both lungs are clear. The visualized skeletal structures are unremarkable. IMPRESSION: No active  cardiopulmonary disease. Electronically Signed   By: Inez Catalina M.D.   On: 12/28/2016 09:42    Procedures Procedures (including critical care time)  Medications Ordered in ED Medications  sodium chloride 0.9 % bolus 1,000 mL (  0 mLs Intravenous Stopped 12/28/16 1100)  acetaminophen (TYLENOL) tablet 650 mg (650 mg Oral Given 12/28/16 0956)     Initial Impression / Assessment and Plan / ED Course  I have reviewed the triage vital signs and the nursing notes.  Pertinent labs & imaging results that were available during my care of the patient were reviewed by me and considered in my medical decision making (see chart for details).  Clinical Course as of Dec 28 1113  Wed Dec 28, 2016  0932 Most likely the near-syncope was vaso-vagal in the act of vomiting/diarrhea. Likely viral gi illness vs antibiotic induced. Otherwise appears well, benign neuro and cardiac exam  [SG]    Clinical Course User Index [SG] Sherwood Gambler, MD    Patient's workup is unremarkable besides very slightly increased creatinine of 1.09 from baseline. Likely some mild dehydration. Either she has acquired a viral GI illness or her vomiting and diarrhea could be from starting antibiotics. I see no signs of obvious bacterial infection recommended she stop antibiotics even no purulent nasal drainage or signs of significant infection. Likely has a viral URI otherwise. Feeling well now. Discharge home with increased fluids and anti-emetics. Discussed return precautions.  Final Clinical Impressions(s) / ED Diagnoses   Final diagnoses:  Near syncope  Gastroenteritis    New Prescriptions New Prescriptions   ONDANSETRON (ZOFRAN ODT) 4 MG DISINTEGRATING TABLET    Take 1 tablet (4 mg total) by mouth every 8 (eight) hours as needed for nausea or vomiting.   I personally performed the services described in this documentation, which was scribed in my presence. The recorded information has been reviewed and is accurate.      Sherwood Gambler, MD 12/28/16 1116

## 2016-12-28 NOTE — ED Triage Notes (Signed)
Pt brought in ems. Per ems pt was seen at pcp for cough and nasal congestion. Pt reports was prescribed antibiotics. Pt reports started vomiting this am while making bed went to bathroom and reports near syncope while attempting to have BM. Pt reports fell forward. Denies any pain at this time. EMS reports was diaphoretic at time of arrival. nad noted at this time.

## 2017-01-02 ENCOUNTER — Encounter (HOSPITAL_COMMUNITY): Payer: Self-pay | Admitting: Emergency Medicine

## 2017-01-02 ENCOUNTER — Inpatient Hospital Stay (HOSPITAL_COMMUNITY)
Admission: EM | Admit: 2017-01-02 | Discharge: 2017-01-05 | DRG: 392 | Disposition: A | Discharge status: Home Health | Payer: Medicare Other | Attending: Internal Medicine | Admitting: Internal Medicine

## 2017-01-02 ENCOUNTER — Emergency Department (HOSPITAL_COMMUNITY): Payer: Medicare Other

## 2017-01-02 ENCOUNTER — Other Ambulatory Visit: Payer: Self-pay

## 2017-01-02 DIAGNOSIS — E785 Hyperlipidemia, unspecified: Secondary | ICD-10-CM | POA: Diagnosis present

## 2017-01-02 DIAGNOSIS — Z9049 Acquired absence of other specified parts of digestive tract: Secondary | ICD-10-CM

## 2017-01-02 DIAGNOSIS — Z90710 Acquired absence of both cervix and uterus: Secondary | ICD-10-CM | POA: Diagnosis not present

## 2017-01-02 DIAGNOSIS — K219 Gastro-esophageal reflux disease without esophagitis: Secondary | ICD-10-CM | POA: Diagnosis present

## 2017-01-02 DIAGNOSIS — K529 Noninfective gastroenteritis and colitis, unspecified: Principal | ICD-10-CM | POA: Diagnosis present

## 2017-01-02 DIAGNOSIS — Z8249 Family history of ischemic heart disease and other diseases of the circulatory system: Secondary | ICD-10-CM | POA: Diagnosis not present

## 2017-01-02 DIAGNOSIS — E876 Hypokalemia: Secondary | ICD-10-CM | POA: Diagnosis present

## 2017-01-02 DIAGNOSIS — R55 Syncope and collapse: Secondary | ICD-10-CM

## 2017-01-02 DIAGNOSIS — I10 Essential (primary) hypertension: Secondary | ICD-10-CM | POA: Diagnosis present

## 2017-01-02 DIAGNOSIS — Z79899 Other long term (current) drug therapy: Secondary | ICD-10-CM | POA: Diagnosis not present

## 2017-01-02 DIAGNOSIS — Z87891 Personal history of nicotine dependence: Secondary | ICD-10-CM

## 2017-01-02 DIAGNOSIS — Z86010 Personal history of colonic polyps: Secondary | ICD-10-CM | POA: Diagnosis not present

## 2017-01-02 DIAGNOSIS — I700 Atherosclerosis of aorta: Secondary | ICD-10-CM | POA: Diagnosis present

## 2017-01-02 DIAGNOSIS — R001 Bradycardia, unspecified: Secondary | ICD-10-CM | POA: Diagnosis present

## 2017-01-02 LAB — CBC WITH DIFFERENTIAL/PLATELET
BASOS PCT: 0 %
Basophils Absolute: 0 10*3/uL (ref 0.0–0.1)
EOS ABS: 0 10*3/uL (ref 0.0–0.7)
Eosinophils Relative: 0 %
HCT: 37.7 % (ref 36.0–46.0)
Hemoglobin: 12.3 g/dL (ref 12.0–15.0)
Lymphocytes Relative: 5 %
Lymphs Abs: 0.9 10*3/uL (ref 0.7–4.0)
MCH: 29 pg (ref 26.0–34.0)
MCHC: 32.6 g/dL (ref 30.0–36.0)
MCV: 88.9 fL (ref 78.0–100.0)
MONO ABS: 1.6 10*3/uL — AB (ref 0.1–1.0)
MONOS PCT: 8 %
Neutro Abs: 17.3 10*3/uL — ABNORMAL HIGH (ref 1.7–7.7)
Neutrophils Relative %: 87 %
Platelets: 344 10*3/uL (ref 150–400)
RBC: 4.24 MIL/uL (ref 3.87–5.11)
RDW: 15 % (ref 11.5–15.5)
WBC: 19.9 10*3/uL — ABNORMAL HIGH (ref 4.0–10.5)

## 2017-01-02 LAB — COMPREHENSIVE METABOLIC PANEL
ALBUMIN: 4 g/dL (ref 3.5–5.0)
ALT: 22 U/L (ref 14–54)
AST: 25 U/L (ref 15–41)
Alkaline Phosphatase: 76 U/L (ref 38–126)
Anion gap: 12 (ref 5–15)
BILIRUBIN TOTAL: 0.4 mg/dL (ref 0.3–1.2)
BUN: 21 mg/dL — ABNORMAL HIGH (ref 6–20)
CO2: 24 mmol/L (ref 22–32)
Calcium: 9.6 mg/dL (ref 8.9–10.3)
Chloride: 100 mmol/L — ABNORMAL LOW (ref 101–111)
Creatinine, Ser: 0.91 mg/dL (ref 0.44–1.00)
GFR calc non Af Amer: 59 mL/min — ABNORMAL LOW (ref >60)
GLUCOSE: 109 mg/dL — AB (ref 65–99)
POTASSIUM: 3 mmol/L — AB (ref 3.5–5.1)
SODIUM: 136 mmol/L (ref 135–145)
TOTAL PROTEIN: 7.7 g/dL (ref 6.5–8.1)

## 2017-01-02 LAB — URINALYSIS, ROUTINE W REFLEX MICROSCOPIC
BILIRUBIN URINE: NEGATIVE
GLUCOSE, UA: NEGATIVE mg/dL
HGB URINE DIPSTICK: NEGATIVE
KETONES UR: NEGATIVE mg/dL
LEUKOCYTES UA: NEGATIVE
NITRITE: NEGATIVE
PH: 6 (ref 5.0–8.0)
PROTEIN: NEGATIVE mg/dL
Specific Gravity, Urine: >1.046 — ABNORMAL HIGH (ref 1.005–1.030)

## 2017-01-02 LAB — LIPASE, BLOOD: Lipase: 20 U/L (ref 11–51)

## 2017-01-02 MED ORDER — IOPAMIDOL (ISOVUE-300) INJECTION 61%
100.0000 mL | Freq: Once | INTRAVENOUS | Status: AC | PRN
  Administered 2017-01-02: 100 mL via INTRAVENOUS

## 2017-01-02 MED ORDER — MORPHINE SULFATE (PF) 2 MG/ML IV SOLN
2.0000 mg | Freq: Once | INTRAVENOUS | Status: AC
  Administered 2017-01-02: 2 mg via INTRAVENOUS
  Filled 2017-01-02: qty 1

## 2017-01-02 MED ORDER — POTASSIUM CHLORIDE 20 MEQ/15ML (10%) PO SOLN
40.0000 meq | Freq: Once | ORAL | Status: AC
  Administered 2017-01-02: 40 meq via ORAL
  Filled 2017-01-02: qty 30

## 2017-01-02 MED ORDER — CIPROFLOXACIN IN D5W 400 MG/200ML IV SOLN
400.0000 mg | Freq: Once | INTRAVENOUS | Status: AC
  Administered 2017-01-02: 400 mg via INTRAVENOUS
  Filled 2017-01-02: qty 200

## 2017-01-02 MED ORDER — LORATADINE 10 MG PO TABS
10.0000 mg | ORAL_TABLET | Freq: Every day | ORAL | Status: DC
  Administered 2017-01-03 – 2017-01-05 (×3): 10 mg via ORAL
  Filled 2017-01-02 (×3): qty 1

## 2017-01-02 MED ORDER — MORPHINE SULFATE (PF) 2 MG/ML IV SOLN
1.0000 mg | INTRAVENOUS | Status: DC | PRN
  Administered 2017-01-03 (×2): 1 mg via INTRAVENOUS
  Filled 2017-01-02 (×2): qty 1

## 2017-01-02 MED ORDER — ACETAMINOPHEN 650 MG RE SUPP: 650.0000 mg | Freq: Four times a day (QID) | RECTAL | Status: DC | PRN

## 2017-01-02 MED ORDER — SODIUM CHLORIDE 0.9 % IV SOLN
INTRAVENOUS | Status: AC
  Administered 2017-01-02 – 2017-01-03 (×2): via INTRAVENOUS

## 2017-01-02 MED ORDER — SODIUM CHLORIDE 0.9 % IV BOLUS (SEPSIS)
500.0000 mL | Freq: Once | INTRAVENOUS | Status: AC
  Administered 2017-01-02: 500 mL via INTRAVENOUS

## 2017-01-02 MED ORDER — ENOXAPARIN SODIUM 40 MG/0.4ML ~~LOC~~ SOLN
40.0000 mg | SUBCUTANEOUS | Status: DC
  Administered 2017-01-02: 40 mg via SUBCUTANEOUS
  Filled 2017-01-02: qty 0.4

## 2017-01-02 MED ORDER — ACETAMINOPHEN 325 MG PO TABS
650.0000 mg | ORAL_TABLET | Freq: Four times a day (QID) | ORAL | Status: DC | PRN
  Administered 2017-01-04 – 2017-01-05 (×3): 650 mg via ORAL
  Filled 2017-01-02 (×4): qty 2

## 2017-01-02 MED ORDER — TRAMADOL HCL 50 MG PO TABS
50.0000 mg | ORAL_TABLET | Freq: Every day | ORAL | Status: DC | PRN
  Administered 2017-01-02 – 2017-01-05 (×5): 50 mg via ORAL
  Filled 2017-01-02 (×5): qty 1

## 2017-01-02 MED ORDER — MECLIZINE HCL 12.5 MG PO TABS: 25.0000 mg | ORAL_TABLET | Freq: Three times a day (TID) | ORAL | Status: DC | PRN

## 2017-01-02 MED ORDER — PANTOPRAZOLE SODIUM 40 MG PO TBEC
40.0000 mg | DELAYED_RELEASE_TABLET | Freq: Every day | ORAL | Status: DC
  Administered 2017-01-03 – 2017-01-05 (×3): 40 mg via ORAL
  Filled 2017-01-02 (×3): qty 1

## 2017-01-02 MED ORDER — POTASSIUM CHLORIDE CRYS ER 20 MEQ PO TBCR
40.0000 meq | EXTENDED_RELEASE_TABLET | Freq: Every day | ORAL | Status: DC
  Administered 2017-01-02 – 2017-01-05 (×4): 40 meq via ORAL
  Filled 2017-01-02 (×4): qty 2

## 2017-01-02 MED ORDER — METRONIDAZOLE 500 MG PO TABS
500.0000 mg | ORAL_TABLET | Freq: Three times a day (TID) | ORAL | Status: DC
  Administered 2017-01-02 – 2017-01-05 (×9): 500 mg via ORAL
  Filled 2017-01-02 (×10): qty 1

## 2017-01-02 MED ORDER — ONDANSETRON 4 MG PO TBDP: 4.0000 mg | ORAL_TABLET | Freq: Three times a day (TID) | ORAL | Status: DC | PRN

## 2017-01-02 MED ORDER — AMITRIPTYLINE HCL 25 MG PO TABS: 25.0000 mg | ORAL_TABLET | Freq: Every evening | ORAL | Status: DC | PRN

## 2017-01-02 MED ORDER — FLUTICASONE PROPIONATE 50 MCG/ACT NA SUSP
2.0000 | Freq: Every day | NASAL | Status: DC
  Administered 2017-01-03 – 2017-01-05 (×3): 2 via NASAL
  Filled 2017-01-02: qty 16

## 2017-01-02 MED ORDER — CIPROFLOXACIN IN D5W 400 MG/200ML IV SOLN
400.0000 mg | Freq: Two times a day (BID) | INTRAVENOUS | Status: DC
  Administered 2017-01-03 – 2017-01-05 (×5): 400 mg via INTRAVENOUS
  Filled 2017-01-02 (×5): qty 200

## 2017-01-02 MED ORDER — POLYVINYL ALCOHOL 1.4 % OP SOLN: 1.0000 [drp] | Freq: Two times a day (BID) | OPHTHALMIC | Status: DC | PRN

## 2017-01-02 MED ORDER — EZETIMIBE 10 MG PO TABS
10.0000 mg | ORAL_TABLET | Freq: Every morning | ORAL | Status: DC
  Administered 2017-01-03 – 2017-01-05 (×3): 10 mg via ORAL
  Filled 2017-01-02 (×3): qty 1

## 2017-01-02 MED ORDER — ZOLPIDEM TARTRATE 5 MG PO TABS
5.0000 mg | ORAL_TABLET | Freq: Every evening | ORAL | Status: DC | PRN
  Administered 2017-01-03 – 2017-01-04 (×2): 5 mg via ORAL
  Filled 2017-01-02 (×2): qty 1

## 2017-01-02 MED ORDER — ONDANSETRON HCL 4 MG/2ML IJ SOLN
4.0000 mg | Freq: Once | INTRAMUSCULAR | Status: AC
  Administered 2017-01-02: 4 mg via INTRAVENOUS
  Filled 2017-01-02: qty 2

## 2017-01-02 MED ORDER — SIMETHICONE 80 MG PO CHEW: 160.0000 mg | CHEWABLE_TABLET | ORAL | Status: DC | PRN

## 2017-01-02 MED ORDER — METRONIDAZOLE 500 MG PO TABS: 500.0000 mg | ORAL_TABLET | Freq: Three times a day (TID) | ORAL | Status: DC

## 2017-01-02 MED ORDER — ESCITALOPRAM OXALATE 10 MG PO TABS: 10.0000 mg | ORAL_TABLET | Freq: Every day | ORAL | Status: DC | PRN

## 2017-01-02 NOTE — ED Triage Notes (Signed)
Patients by [facility]. Patient was sent to ED by [facility]. Patient complains of nausea and vomiting and states bright red blood in stool today.

## 2017-01-02 NOTE — ED Provider Notes (Signed)
[place] DEPT Provider Note   CSN: [id number] Arrival date & time: 01/02/17  1401     History   Chief Complaint Chief Complaint  Patient presents with  . Abdominal Pain    HPI DELLAH TISLER is a 76 y.o. female.  HPI  76 year old female presents today complaining of nausea, vomiting, diarrhea, and abdominal pain. She had influenza illness last week and had some nausea vomiting and diarrhea but this resolved 5 days ago. This morning she awoke with abdominal pain and became nauseated and vomited. She states she initially felt crampy like she needed to have a bowel movement and didn't but then began having diarrhea that was bloody in nature. She states that she felt somewhat lightheaded with the pain. She was seen last Wednesday on the second day of her influenza-like illness for a syncopal episode. Denies any new fever, chills, chest pain, cough, or urinary tract infection symptoms  Past Medical History:  Diagnosis Date  . Acid reflux   . Arthritis   . 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 Bronchitis january 2014  . Hiatal hernia   . High cholesterol   . History of contact dermatitis   . Hx: recurrent pneumonia   . Hypertension   . IDA (iron deficiency anemia)    chronic iron infusions  . Reflux   . Vertigo     Patient Active Problem List   Diagnosis Date Noted  . Heme positive stool 06/10/2015  . Change in bowel habits 06/10/2015  . Abdominal pain, epigastric 06/10/2015  . High cholesterol   . Acid reflux   . GERD 01/03/2011  . HEMORRHOIDS 04/15/2010  . Iron deficiency anemia due to chronic blood loss 01/12/2010  . 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 ABDOMINAL HYSTERECTOMY    . APPENDECTOMY    . BILATERAL BREAST BIOPSIES  BENIGN    . CHOLECYSTECTOMY    . COLONOSCOPY N/A 06/29/2015   SLF: The left colon is redundant 2. four colon polyps removed.  No source  for change in bowel habits identified 3. Rectal bleeding due to large internal hemorrhoids   . COLONOSCOPY W/ BIOPSIES  6 2010   Dr. Oneida Alar: Polypoid appearing lesion of the ascending colon, 3 mm sessile rectal polyp, small internal hemorrhoids. Pathology revealed polypoid mucosa, no adenomatous changes  . Double-balloon enteroscopy, antegrade  April 2011   Dr. 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  Marland Kitchen ESOPHAGOGASTRODUODENOSCOPY  04/2009   Dr. Oneida Alar: Patent Schatzki ring dilated with advancing the scope, patchy erythema in the antrum, 2 small AVMs in the duodenal bulb, 2 additional AVMs noted in the second portion the duodenum, mild gastritis on pathology  . ESOPHAGOGASTRODUODENOSCOPY N/A 06/29/2015   SLF: 1. 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 small hiatal hernia 3. Mild non-erosive gastririts- NO obvious source for dyspepsia identified.   Marland Kitchen EYE SURGERY      OB History    No data available       Home Medications    Prior to Admission medications   Medication Sig Start Date End Date Taking? Authorizing Provider  acetaminophen (TYLENOL) 500 MG tablet Take 500-1,000 mg by mouth every 6 (six) hours as needed for mild pain.     Historical Provider, MD  amitriptyline (ELAVIL) 25 MG tablet Take 25 mg by mouth at bedtime as needed for sleep.     Historical  Provider, MD  brimonidine-timolol (COMBIGAN) 0.2-0.5 % ophthalmic solution Administer 1 drop into the left eye every twelve (12) hours. 06/21/16   Historical Provider, MD  celecoxib (CELEBREX) 100 MG capsule Take 100 mg by mouth daily.    Historical Provider, MD  Cetirizine HCl 10 MG CAPS Take 1 capsule by mouth daily.    Historical Provider, MD  docusate sodium (COLACE) 100 MG capsule Take 100 mg by mouth daily as needed for mild constipation or moderate constipation.     Historical Provider, MD  escitalopram (LEXAPRO) 10 MG tablet Take 10 mg by mouth daily as needed (depression).  10/25/13    Historical Provider, MD  esomeprazole (NEXIUM) 40 MG capsule Take 40 mg by mouth daily before breakfast.      Historical Provider, MD  fish oil-omega-3 fatty acids 1000 MG capsule Take 1 g by mouth daily.     Historical Provider, MD  fluticasone (FLONASE) 50 MCG/ACT nasal spray Place 2 sprays into both nostrils daily.  04/11/13   Historical Provider, MD  meclizine (ANTIVERT) 25 MG tablet Take 1 tablet (25 mg total) by mouth 3 (three) times daily as needed for dizziness. 09/13/16   Rolland Porter, MD  methylcellulose (ARTIFICIAL TEARS) 1 % ophthalmic solution Place 1 drop into both eyes 2 (two) times daily as needed (dry eyes).    Historical Provider, MD  Multiple Vitamin (MULTIVITAMIN) capsule Take 1 capsule by mouth daily.      Historical Provider, MD  ondansetron (ZOFRAN ODT) 4 MG disintegrating tablet Take 1 tablet (4 mg total) by mouth every 8 (eight) hours as needed for nausea or vomiting. 12/28/16   Sherwood Gambler, MD  potassium chloride SA (K-DUR,KLOR-CON) 20 MEQ tablet Take 2 tablets (40 mEq total) by mouth daily. 11/28/16   Baird Cancer, PA-C  Simethicone 125 MG CAPS Take 1 capsule (125 mg total) by mouth as needed (max 6 capsules per 24 hours). 06/05/15   Orpah Greek, MD  traMADol (ULTRAM) 50 MG tablet Take 50 mg by mouth daily as needed for moderate pain.  09/24/13   Historical Provider, MD  triamcinolone (NASACORT AQ) 55 MCG/ACT AERO nasal inhaler Place 2 sprays into the nose daily. 09/13/16   Rolland Porter, MD  triamcinolone cream (KENALOG) 0.1 % Apply 1 application topically daily as needed (rash).  11/24/16   Historical Provider, MD  ZETIA 10 MG tablet Take 10 mg by mouth every morning. 11/30/15   Historical Provider, MD  zolpidem (AMBIEN) 10 MG tablet Take 10 mg by mouth at bedtime as needed for sleep.  02/26/14   Historical Provider, MD    Family History Family History  Problem Relation Age of Onset  . Heart failure Mother   . Diabetes Sister   . Colon cancer Neg Hx      Social History Social History  Substance Use Topics  . Smoking status: Former Smoker    Packs/day: 1.00    Years: 25.00    Types: Cigarettes    Quit date: 09/14/2007  . Smokeless tobacco: Never Used  . Alcohol use No     Allergies   Latex; Sulfa antibiotics; and Sulfonamide derivatives   Review of Systems Review of Systems  All other systems reviewed and are negative.    Physical Exam Updated Vital Signs BP 135/80 (BP Location: Right Arm)   Pulse (!) 49   Temp 98.3 F (36.8 C) (Oral)   Resp 20   Ht 5\' 4"  (1.626 m)   Wt 72.6 kg  SpO2 99%   BMI 27.46 kg/m   Physical Exam  Constitutional: She is oriented to person, place, and time. She appears well-developed and well-nourished. No distress.  HENT:  Head: Normocephalic and atraumatic.  Right Ear: External ear normal.  Left Ear: External ear normal.  Mouth/Throat: Oropharynx is clear and moist.  Eyes: Pupils are equal, round, and reactive to light.  Neck: Normal range of motion. Neck supple.  Cardiovascular: Normal rate, regular rhythm, normal heart sounds and intact distal pulses.   Pulmonary/Chest: Effort normal and breath sounds normal.  Abdominal: Soft. Bowel sounds are normal. There is tenderness.  Moderate diffuse tenderness palpation bilateral lower quadrants  Genitourinary: Rectal exam shows guaiac positive stool.  Musculoskeletal: Normal range of motion. She exhibits no edema.  Neurological: She is alert and oriented to person, place, and time.  Skin: Skin is warm and dry. Capillary refill takes less than 2 seconds.  Psychiatric: She has a normal mood and affect. Her behavior is normal.  Nursing note and vitals reviewed.    ED Treatments / Results  Labs (all labs ordered are listed, but only abnormal results are displayed) Labs Reviewed  CBC WITH DIFFERENTIAL/PLATELET  COMPREHENSIVE METABOLIC PANEL  LIPASE, BLOOD  URINALYSIS, ROUTINE W REFLEX MICROSCOPIC  POC OCCULT BLOOD, ED    EKG   EKG Interpretation  Date/Time:  Monday January 02 2017 14:28:26 EST Ventricular Rate:  48 PR Interval:    QRS Duration: 97 QT Interval:  450 QTC Calculation: 402 R Axis:   5 Text Interpretation:  Sinus bradycardia Abnormal R-wave progression, early transition Borderline T abnormalities, anterior leads rate has significantly decreased since last tracing 5 days ago Confirmed by Tearsa Kowalewski MD, Andee Poles [phone]) on 01/02/2017 2:40:51 PM       Radiology No results found.  Procedures Procedures (including critical care time)  Medications Ordered in ED Medications  sodium chloride 0.9 % bolus 500 mL (not administered)  morphine 2 MG/ML injection 2 mg (not administered)  ondansetron (ZOFRAN) injection 4 mg (not administered)     Initial Impression / Assessment and Plan / ED Course  I have reviewed the triage vital signs and the nursing notes.  Pertinent labs & imaging results that were available during my care of the patient were reviewed by me and considered in my medical decision making (see chart for details).     1- colitis 2- rectal bleeding 3-bradycardia  Discussed with Dr. Maudie Mercury and he will place admission orders  Final Clinical Impressions(s) / ED Diagnoses   Final diagnoses:  Colitis    New Prescriptions New Prescriptions   No medications on file     Pattricia Boss, MD 01/02/17 1857

## 2017-01-02 NOTE — H&P (Signed)
TRH H&P   Patient Demographics:    Nicolette Suite, is a 76 y.o. female  MRN: [id number]   DOB - [birth year]  Admit Date - 01/02/2017  Outpatient Primary MD for the patient is [facility]  Referring MD/NP/PA: Pattricia Boss  Outpatient Specialists:  Barney Drain  Patient coming from:  home  Chief Complaint  Patient presents with  . Abdominal Pain      HPI:    Freyda Rozell  is a 76 y.o. female, w hx of hyperlipidemia, who presents w c/o congestion about 1 week ago , when seen at [facility] and was tx with amoxicillin and then developed n/v (no blood) then following day. Pt then apparently felt slightly better, but today she developed constipation, and nausea.  She went back to [facility] today and was sent to ER for evaluation.  While she was waiting for the ambulance had near sycnope, " felt like passing out"  EKG nsr at 48, nl axis, no st-t changes c/w ischemia.   In ED, pt noted to be hypokalemic, and bradycardic.  Pt had wbc elevation of 19.9 and CT abd/ pelvis revealed that she had colitis.  Pt admits to some bilateral lower abdominal discomfort.  Denies fever, cp, palp, sob, diarrhea, brbpr.      Review of systems:    In addition to the HPI above, No Fever-chills, No Headache, No changes with Vision or hearing, No problems swallowing food or Liquids, No Chest pain, Cough or Shortness of Breath, No Blood in stool or Urine, No dysuria, No new skin rashes or bruises, No new joints pains-aches,  No new weakness, tingling, numbness in any extremity, No recent weight gain or loss, No polyuria, polydypsia or polyphagia, No significant Mental Stressors.  A full 10 point Review of Systems was done, except as stated above, all other Review of Systems were negative.   With Past History of the following :    Past  Medical History:  Diagnosis Date  . Acid reflux   . Arthritis   . 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 Bronchitis january 2014  . Hiatal hernia   . High cholesterol   . History of contact dermatitis   . Hx: recurrent pneumonia   . Hypertension   . IDA (iron deficiency anemia)    chronic iron infusions  . Reflux   . Vertigo       Past Surgical History:  Procedure Laterality Date  . ABDOMINAL HYSTERECTOMY    . APPENDECTOMY    . BILATERAL BREAST BIOPSIES  BENIGN    . CHOLECYSTECTOMY    . COLONOSCOPY N/A 06/29/2015   SLF: The left colon is redundant 2. four colon polyps removed. No source  for change in bowel habits identified 3. Rectal bleeding due to large internal hemorrhoids   .  COLONOSCOPY W/ BIOPSIES  6 2010   Dr. Oneida Alar: Polypoid appearing lesion of the ascending colon, 3 mm sessile rectal polyp, small internal hemorrhoids. Pathology revealed polypoid mucosa, no adenomatous changes  . Double-balloon enteroscopy, antegrade  April 2011   Dr. 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  Marland Kitchen ESOPHAGOGASTRODUODENOSCOPY  04/2009   Dr. Oneida Alar: Patent Schatzki ring dilated with advancing the scope, patchy erythema in the antrum, 2 small AVMs in the duodenal bulb, 2 additional AVMs noted in the second portion the duodenum, mild gastritis on pathology  . ESOPHAGOGASTRODUODENOSCOPY N/A 06/29/2015   SLF: 1. 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 small hiatal hernia 3. Mild non-erosive gastririts- NO obvious source for dyspepsia identified.   Marland Kitchen EYE SURGERY        Social History:     Social History  Substance Use Topics  . Smoking status: Former Smoker    Packs/day: 1.00    Years: 25.00    Types: Cigarettes    Quit date: 09/14/2007  . Smokeless tobacco: Never Used  . Alcohol use No     Lives - at home  Mobility - walks by self   Family History :     Family History  Problem Relation Age of Onset  . Heart  failure Mother   . Diabetes Sister   . Heart attack Father   . Colon cancer Neg Hx       Home Medications:   Prior to Admission medications   Medication Sig Start Date End Date Taking? Authorizing Provider  amitriptyline (ELAVIL) 25 MG tablet Take 25 mg by mouth at bedtime as needed for sleep.    Yes Historical Provider, MD  Aspirin-Acetaminophen-Caffeine (GOODY HEADACHE PO) Take 1 packet by mouth daily as needed (for pain).   Yes Historical Provider, MD  celecoxib (CELEBREX) 100 MG capsule Take 100 mg by mouth daily.   Yes Historical Provider, MD  Cetirizine HCl 10 MG CAPS Take 1 capsule by mouth daily.   Yes Historical Provider, MD  escitalopram (LEXAPRO) 10 MG tablet Take 10 mg by mouth daily as needed (depression).  10/25/13  Yes Historical Provider, MD  esomeprazole (NEXIUM) 40 MG capsule Take 40 mg by mouth daily before breakfast.     Yes Historical Provider, MD  fish oil-omega-3 fatty acids 1000 MG capsule Take 1 g by mouth daily.    Yes Historical Provider, MD  fluticasone (FLONASE) 50 MCG/ACT nasal spray Place 2 sprays into both nostrils daily.  04/11/13  Yes Historical Provider, MD  meclizine (ANTIVERT) 25 MG tablet Take 1 tablet (25 mg total) by mouth 3 (three) times daily as needed for dizziness. 09/13/16  Yes Rolland Porter, MD  methylcellulose (ARTIFICIAL TEARS) 1 % ophthalmic solution Place 1 drop into both eyes 2 (two) times daily as needed (dry eyes).   Yes Historical Provider, MD  Multiple Vitamin (MULTIVITAMIN) capsule Take 1 capsule by mouth daily.     Yes Historical Provider, MD  ondansetron (ZOFRAN ODT) 4 MG disintegrating tablet Take 1 tablet (4 mg total) by mouth every 8 (eight) hours as needed for nausea or vomiting. 12/28/16  Yes Sherwood Gambler, MD  potassium chloride SA (K-DUR,KLOR-CON) 20 MEQ tablet Take 2 tablets (40 mEq total) by mouth daily. 11/28/16  Yes Baird Cancer, PA-C  Simethicone 125 MG CAPS Take 1 capsule (125 mg total) by mouth as needed (max 6 capsules per  24 hours). 06/05/15  Yes Orpah Greek, MD  traMADol (ULTRAM) 50 MG tablet  Take 50 mg by mouth daily as needed for moderate pain.  09/24/13  Yes Historical Provider, MD  triamcinolone cream (KENALOG) 0.1 % Apply 1 application topically daily as needed (rash).  11/24/16  Yes Historical Provider, MD  ZETIA 10 MG tablet Take 10 mg by mouth every morning. 11/30/15  Yes Historical Provider, MD  zolpidem (AMBIEN) 10 MG tablet Take 10 mg by mouth at bedtime as needed for sleep.  02/26/14  Yes Historical Provider, MD     Allergies:     Allergies  Allergen Reactions  . Latex Hives, Itching and Rash  . Sulfa Antibiotics Hives and Itching  . Sulfonamide Derivatives Hives and Itching     Physical Exam:   Vitals  Blood pressure 146/90, pulse 63, temperature 98.3 F (36.8 C), temperature source Oral, resp. rate 23, height 5\' 4"  (1.626 m), weight 72.6 kg (160 lb), SpO2 94 %.   1. General  lying in bed in NAD,    2. Normal affect and insight, Not Suicidal or Homicidal, Awake Alert, Oriented X 3.  3. No F.N deficits, ALL C.Nerves Intact, Strength 5/5 all 4 extremities, Sensation intact all 4 extremities, Plantars down going.  4. Ears and Eyes appear Normal, Conjunctivae clear, PERRLA. Moist Oral Mucosa.  5. Supple Neck, No JVD, No cervical lymphadenopathy appriciated, No Carotid Bruits.  6. Symmetrical Chest wall movement, Good air movement bilaterally, CTAB.  7. RRR, No Gallops, Rubs or Murmurs, No Parasternal Heave.  8. Positive Bowel Sounds, Abdomen Soft, No tenderness, No organomegaly appriciated,No rebound -guarding or rigidity.  9.  No Cyanosis, Normal Skin Turgor, No Skin Rash or Bruise.  10. Good muscle tone,  joints appear normal , no effusions, Normal ROM.  11. No Palpable Lymph Nodes in Neck or Axillae    Data Review:    CBC  Recent Labs Lab 12/28/16 0908 01/02/17 1534  WBC 7.7 19.9*  HGB 13.1 12.3  HCT 40.2 37.7  PLT 269 344  MCV 91.0 88.9  MCH 29.6 29.0    MCHC 32.6 32.6  RDW 15.3 15.0  LYMPHSABS  --  0.9  MONOABS  --  1.6*  EOSABS  --  0.0  BASOSABS  --  0.0   ------------------------------------------------------------------------------------------------------------------  Chemistries   Recent Labs Lab 12/28/16 0908 01/02/17 1534  NA 138 136  K 3.5 3.0*  CL 101 100*  CO2 27 24  GLUCOSE 126* 109*  BUN 9 21*  CREATININE 1.09* 0.91  CALCIUM 9.5 9.6  AST  --  25  ALT  --  22  ALKPHOS  --  76  BILITOT  --  0.4   ------------------------------------------------------------------------------------------------------------------ estimated creatinine clearance is 49.8 mL/min (by C-G formula based on SCr of 0.91 mg/dL). ------------------------------------------------------------------------------------------------------------------ No results for input(s): TSH, T4TOTAL, T3FREE, THYROIDAB in the last 72 hours.  Invalid input(s): FREET3  Coagulation profile No results for input(s): INR, PROTIME in the last 168 hours. ------------------------------------------------------------------------------------------------------------------- No results for input(s): DDIMER in the last 72 hours. -------------------------------------------------------------------------------------------------------------------  Cardiac Enzymes No results for input(s): CKMB, TROPONINI, MYOGLOBIN in the last 168 hours.  Invalid input(s): CK ------------------------------------------------------------------------------------------------------------------ No results found for: BNP   ---------------------------------------------------------------------------------------------------------------  Urinalysis    Component Value Date/Time   COLORURINE YELLOW 01/02/2017 [place] 01/02/2017 1820   LABSPEC >1.046 (H) 01/02/2017 1820   PHURINE 6.0 01/02/2017 1820   GLUCOSEU NEGATIVE 01/02/2017 1820   HGBUR NEGATIVE 01/02/2017 [place] 01/02/2017 [place] 01/02/2017 1820   PROTEINUR NEGATIVE  01/02/2017 1820   UROBILINOGEN 0.2 06/05/2015 1357   NITRITE NEGATIVE 01/02/2017 1820   LEUKOCYTESUR NEGATIVE 01/02/2017 1820    ----------------------------------------------------------------------------------------------------------------   Imaging Results:    Ct Abdomen Pelvis W Contrast  Result Date: 01/02/2017 CLINICAL DATA:  Abdominal pain. EXAM: CT ABDOMEN AND PELVIS WITH CONTRAST TECHNIQUE: Multidetector CT imaging of the abdomen and pelvis was performed using the standard protocol following bolus administration of intravenous contrast. CONTRAST:  167mL ISOVUE-300 IOPAMIDOL (ISOVUE-300) INJECTION 61% COMPARISON:  CT the abdomen pelvis 06/05/2015 FINDINGS: Lower chest: Bilateral lower lobe atelectasis is worse than on the prior exam. The heart size is normal. No significant pleural or pericardial effusion is present. Hepatobiliary: Mild intrahepatic biliary dilation is present. Mild hepatic steatosis is again seen. No discrete lesions are present. Pancreas: No significant inflammatory changes are present. There is proximal pancreatic duct dilation without significant change or obstruction. The distal pancreas is within normal limits. Spleen: Calcifications are again seen within the spleen. No discrete lesions are present. Adrenals/Urinary Tract: The adrenal glands are within normal limits bilaterally. The kidneys and ureters are unremarkable. The urinary bladder is within normal limits. Stomach/Bowel: The distal esophagus is mildly dilated. The stomach is unremarkable. Small bowel is within normal limits. The appendix is not visualized and may be surgically absent. The terminal small bowel is within normal limits. There is marked wall thickening in the mid and distal transverse colon extending to the splenic flexure. The descending and sigmoid colon are decompressed. There is no free air or abscess.  Vascular/Lymphatic: Atherosclerotic calcifications are present in the aorta without aneurysm. No significant retroperitoneal or pelvic adenopathy is present. Reproductive: Hysterectomy is noted. The left ovary is visualized and normal. The right ovary is not visualized and may be surgically absent. Other: No significant free fluid or free air is present. Musculoskeletal: Rightward curvature of the lumbar spine centered at L2-3. There is marked sclerosis at T11-12 without significant interval change. No other focal lytic or blastic lesions are present. The bony pelvis is intact. IMPRESSION: 1. Diffuse inflammatory change of the transverse colon to the level of splenic flexure compatible with acute nonspecific colitis. 2. No evidence for rupture or abscess. 3. Mild intra and extrahepatic biliary dilation following cholecystectomy. 4. Atherosclerosis. 5. Distal colon is collapsed. 6. Hysterectomy and probable right-sided oophorectomy. Electronically Signed   By: San Morelle M.D.   On: 01/02/2017 18:24       Assessment & Plan:    Principal Problem:   Colitis Active Problems:   Bradycardia   Syncope   Hypokalemia    1.  Colitis tx with Cipro, Flagyl Stool for fecal leukocytes, culture, c. Diff  2. Bradycardia, near syncope Tele Trop I q6h x3 Check TSH Check carotid ultrasound and cardiac echo.   3. Leukocytosis secondary to #1  4. Hypokalemia Replete Check cmp in am Check magnesium in am  5. Hyperlipidemia Cont zetia.     DVT Prophylaxis   Lovenox - SCDs  AM Labs Ordered, also please review Full Orders  Family Communication: Admission, patients condition and plan of care including tests being ordered have been discussed with the patient  who indicate understanding and agree with the plan and Code Status.  Code Status FULL CODE  Likely DC to  home  Condition GUARDED   Consults called: none  Admission status: inpatient  Time spent in minutes : 45  minutes   Jani Gravel M.D on 01/02/2017 at 7:34 PM  Between 7am to 7pm - Pager - [phone] After 7pm  go to [email or web address] - password [email or web address]  [facility] - Office  [phone]    

## 2017-01-03 DIAGNOSIS — I700 Atherosclerosis of aorta: Secondary | ICD-10-CM

## 2017-01-03 LAB — CBC WITH DIFFERENTIAL/PLATELET
BASOS ABS: 0 10*3/uL (ref 0.0–0.1)
BASOS PCT: 0 %
EOS PCT: 0 %
Eosinophils Absolute: 0 10*3/uL (ref 0.0–0.7)
HEMATOCRIT: 36.9 % (ref 36.0–46.0)
Hemoglobin: 12.2 g/dL (ref 12.0–15.0)
Lymphocytes Relative: 11 %
Lymphs Abs: 1.9 10*3/uL (ref 0.7–4.0)
MCH: 29.3 pg (ref 26.0–34.0)
MCHC: 33.1 g/dL (ref 30.0–36.0)
MCV: 88.7 fL (ref 78.0–100.0)
MONO ABS: 1.1 10*3/uL — AB (ref 0.1–1.0)
MONOS PCT: 6 %
NEUTROS ABS: 13.8 10*3/uL — AB (ref 1.7–7.7)
Neutrophils Relative %: 83 %
PLATELETS: 361 10*3/uL (ref 150–400)
RBC: 4.16 MIL/uL (ref 3.87–5.11)
RDW: 15.4 % (ref 11.5–15.5)
WBC: 16.7 10*3/uL — ABNORMAL HIGH (ref 4.0–10.5)

## 2017-01-03 LAB — COMPREHENSIVE METABOLIC PANEL
ALBUMIN: 3.7 g/dL (ref 3.5–5.0)
ALK PHOS: 75 U/L (ref 38–126)
ALT: 27 U/L (ref 14–54)
ANION GAP: 10 (ref 5–15)
AST: 33 U/L (ref 15–41)
BILIRUBIN TOTAL: 0.6 mg/dL (ref 0.3–1.2)
BUN: 13 mg/dL (ref 6–20)
CALCIUM: 9 mg/dL (ref 8.9–10.3)
CO2: 23 mmol/L (ref 22–32)
CREATININE: 0.71 mg/dL (ref 0.44–1.00)
Chloride: 100 mmol/L — ABNORMAL LOW (ref 101–111)
GFR calc Af Amer: >60 mL/min (ref >60)
GFR calc non Af Amer: >60 mL/min (ref >60)
GLUCOSE: 96 mg/dL (ref 65–99)
Potassium: 4.1 mmol/L (ref 3.5–5.1)
SODIUM: 133 mmol/L — AB (ref 135–145)
TOTAL PROTEIN: 7.4 g/dL (ref 6.5–8.1)

## 2017-01-03 LAB — C DIFFICILE QUICK SCREEN W PCR REFLEX
C DIFFICLE (CDIFF) ANTIGEN: NEGATIVE
C Diff interpretation: NOT DETECTED
C Diff toxin: NEGATIVE

## 2017-01-03 LAB — OCCULT BLOOD, POC DEVICE: Fecal Occult Bld: POSITIVE — AB

## 2017-01-03 NOTE — Care Management Note (Signed)
Case Management Note  Patient Details  Name: Melody Mitchell MRN: [id number] Date of Birth: 08/29/1941  Subjective/Objective:                  Pt is from home, she is ind with ADL's and ambulates with no assistive device prior to admission. She lives alone and has family for support. She has PCP, she has family who drive her to appointments. She has no difficulty affording or managing medications. He plans to return home with self care. She has walker to use at home if she needs one.   Action/Plan: Anticipate return home with self care. No CM needs. Will follow for DC planning.   Expected Discharge Date:     01/05/2017             Expected Discharge Plan:  Home/Self Care  In-House Referral:  NA  Discharge planning Services  CM Consult  Post Acute Care Choice:  NA Choice offered to:  NA  Status of Service:  In process, will continue to follow  Sherald Barge, RN 01/03/2017, 1:29 PM

## 2017-01-03 NOTE — Progress Notes (Signed)
  PROGRESS NOTE  Melody Mitchell [id number] DOB: 01/23/1941 DOA: 01/02/2017 PCP: [facility]  Brief Narrative: 76 year old woman presented with abdominal pain, report of rectal bleeding. Initial evaluation revealed transverse colitis and leukocytosis. Admitted for further treatment.  Assessment/Plan 1. Acute transverse colitis with associated bowel movement with blood. C. difficile negative. Presumed infectious. No evidence of complicating features. Hemoglobin stable. No significant blood loss.  Continue empiric antibiotics, analgesics. 2. Marked sinus bradycardia. Resolved. No further evaluation suggested. 3. Aortic atherosclerosis.   Will start liquid diet and monitor. Repeat CBC and BMP in the morning. Advance diet as tolerated, maybe up to go home in the next 48 hours.  DVT prophylaxis: SCDs Code Status: full code Family Communication: son, sister at bedside Disposition Plan: home   Murray Hodgkins, MD  [facility] Direct contact: [phone] --[place] [place]  --[email or web address]; password [email or web address]  7PM-7AM contact night coverage as above 01/03/2017, 4:33 PM  LOS: 1 day   Consultants:    Procedures:    Antimicrobials:  Ciprofloxacin 2/19 >>  Flagyl 2/19 >>  Interval history/Subjective: No vomiting or nausea but continues to have lower abdominal pain. Would like to try liquids.  Objective: Vitals:   01/02/17 2130 01/02/17 2158 01/03/17 0723 01/03/17 1316  BP:  (!) 174/91 (!) 130/93 (!) 132/95  Pulse: 63 67 (!) 105 (!) 103  Resp: 21 18 18 20   Temp:  99.9 F (37.7 C) 99.4 F (37.4 C) 99.1 F (37.3 C)  TempSrc:  Oral Oral Oral  SpO2: 96% 93% 96% 97%  Weight:  68.6 kg (151 lb 3.8 oz) 67.8 kg (149 lb 7.6 oz)   Height:  5\' 4"  (1.626 m)      Intake/Output Summary (Last 24 hours) at 01/03/17 1633 Last data filed at 01/03/17 1400  Gross per 24 hour  Intake           1462.5 ml  Output              280 ml  Net           1182.5 ml      Filed Weights   01/02/17 1427 01/02/17 2158 01/03/17 0723  Weight: 72.6 kg (160 lb) 68.6 kg (151 lb 3.8 oz) 67.8 kg (149 lb 7.6 oz)    Exam:    Constitutional:   Appears uncomfortable but not toxic.  Cardiovascular regular rate and rhythm. No murmur, rub or gallop. No lower extremity edema.  Respiratory clear to auscultation bilaterally. No wheezes, rales or rhonchi. Normal respiratory effort.  Abdomen: Soft, nondistended, mild to moderate lower abdominal tenderness. Positive bowel sounds.  Psychiatric grossly normal mood and affect. Speech fluent and appropriate.   I have personally reviewed following labs and imaging studies:  Sodium 133, potassium normal. Remainder CMP unremarkable. WBC improved, 16.7.  C. difficile negative  CT noted  Scheduled Meds: . ciprofloxacin  400 mg Intravenous Q12H  . ezetimibe  10 mg Oral q morning - 10a  . fluticasone  2 spray Each Nare Daily  . loratadine  10 mg Oral Daily  . metroNIDAZOLE  500 mg Oral Q8H  . pantoprazole  40 mg Oral Daily  . potassium chloride SA  40 mEq Oral Daily   Continuous Infusions: . sodium chloride 75 mL/hr at 01/03/17 1112    Principal Problem:   Colitis Active Problems:   Hypokalemia   Aortic atherosclerosis (HCC)   LOS: 1 day

## 2017-01-04 DIAGNOSIS — E876 Hypokalemia: Secondary | ICD-10-CM

## 2017-01-04 DIAGNOSIS — R001 Bradycardia, unspecified: Secondary | ICD-10-CM

## 2017-01-04 DIAGNOSIS — K529 Noninfective gastroenteritis and colitis, unspecified: Principal | ICD-10-CM

## 2017-01-04 LAB — CBC
HCT: 37.9 % (ref 36.0–46.0)
HEMOGLOBIN: 12.5 g/dL (ref 12.0–15.0)
MCH: 29.4 pg (ref 26.0–34.0)
MCHC: 33 g/dL (ref 30.0–36.0)
MCV: 89.2 fL (ref 78.0–100.0)
PLATELETS: 329 10*3/uL (ref 150–400)
RBC: 4.25 MIL/uL (ref 3.87–5.11)
RDW: 15.3 % (ref 11.5–15.5)
WBC: 14 10*3/uL — AB (ref 4.0–10.5)

## 2017-01-04 LAB — BASIC METABOLIC PANEL
Anion gap: 11 (ref 5–15)
BUN: 9 mg/dL (ref 6–20)
CALCIUM: 8.8 mg/dL — AB (ref 8.9–10.3)
CO2: 24 mmol/L (ref 22–32)
CREATININE: 0.84 mg/dL (ref 0.44–1.00)
Chloride: 100 mmol/L — ABNORMAL LOW (ref 101–111)
GFR calc non Af Amer: >60 mL/min (ref >60)
Glucose, Bld: 98 mg/dL (ref 65–99)
Potassium: 3.5 mmol/L (ref 3.5–5.1)
SODIUM: 135 mmol/L (ref 135–145)

## 2017-01-04 LAB — LACTOFERRIN, FECAL, QUALITATIVE: Lactoferrin, Fecal, Qual: POSITIVE — AB

## 2017-01-04 MED ORDER — POTASSIUM CHLORIDE CRYS ER 20 MEQ PO TBCR
40.0000 meq | EXTENDED_RELEASE_TABLET | Freq: Once | ORAL | Status: AC
  Administered 2017-01-04: 40 meq via ORAL
  Filled 2017-01-04: qty 2

## 2017-01-04 NOTE — Progress Notes (Signed)
  PROGRESS NOTE  Melody Mitchell [id number] DOB: Nov 01, 1941 DOA: 01/02/2017 PCP: [facility]  Brief Narrative: 76 year old woman presented with abdominal pain, report of rectal bleeding. Initial evaluation revealed transverse colitis and leukocytosis. Admitted for further treatment.  Assessment/Plan 1. Acute transverse colitis with associated bowel movement with blood. C. difficile negative. Presumed infectious. No evidence of complicating features. Hemoglobin stable. No significant blood loss.  Continue empiric antibiotics, analgesics. Advance diet to solids 2. Marked sinus bradycardia. Resolved. No further evaluation suggested. 3. Aortic atherosclerosis.   DVT prophylaxis: SCDs Code Status: full code Family Communication: no family at bedside Disposition Plan: home   Kathie Dike, MD  [facility] Direct contact: [phone] --[address]ia [place]  --[email or web address]; password [email or web address]  7PM-7AM contact night coverage as above 01/04/2017, 2:53 PM  LOS: 2 days   Consultants:    Procedures:    Antimicrobials:  Ciprofloxacin 2/19 >>  Flagyl 2/19 >>  Interval history/Subjective: No vomiting, diarrhea improving.  Objective: Vitals:   01/03/17 2249 01/04/17 0518 01/04/17 0525 01/04/17 1427  BP: 121/84 101/73  109/73  Pulse: 95 97  97  Resp: 20 18  20   Temp: 99.7 F (37.6 C) 99.6 F (37.6 C)  98.5 F (36.9 C)  TempSrc: Oral Oral  Oral  SpO2: 95% 97%  98%  Weight:   67.9 kg (149 lb 11.1 oz)   Height:        Intake/Output Summary (Last 24 hours) at 01/04/17 1453 Last data filed at 01/04/17 1228  Gross per 24 hour  Intake             1140 ml  Output              500 ml  Net              640 ml     Filed Weights   01/02/17 2158 01/03/17 0723 01/04/17 0525  Weight: 68.6 kg (151 lb 3.8 oz) 67.8 kg (149 lb 7.6 oz) 67.9 kg (149 lb 11.1 oz)    Exam:    Constitutional:   Appears in no acute distress and not toxic.  Cardiovascular  regular rate and rhythm. No murmur, rub or gallop. No lower extremity edema.  Respiratory clear to auscultation bilaterally. No wheezes, rales or rhonchi. Normal respiratory effort.  Abdomen: Soft, nondistended, mild to moderate lower abdominal tenderness. Positive bowel sounds.  Psychiatric grossly normal mood and affect. Speech fluent and appropriate.   I have personally reviewed following labs and imaging studies:  Sodium 133, potassium normal. Remainder CMP unremarkable. WBC improved, 16.7.  C. difficile negative  CT noted  Scheduled Meds: . ciprofloxacin  400 mg Intravenous Q12H  . ezetimibe  10 mg Oral q morning - 10a  . fluticasone  2 spray Each Nare Daily  . loratadine  10 mg Oral Daily  . metroNIDAZOLE  500 mg Oral Q8H  . pantoprazole  40 mg Oral Daily  . potassium chloride SA  40 mEq Oral Daily   Continuous Infusions:   Principal Problem:   Colitis Active Problems:   Hypokalemia   Aortic atherosclerosis (HCC)   LOS: 2 days

## 2017-01-04 NOTE — Progress Notes (Signed)
Received call from Infection Prevention.  Pt enteric precautions discontinued at this time.  Pending cultures does not require continued precautions.

## 2017-01-05 LAB — CBC
HEMATOCRIT: 35.7 % — AB (ref 36.0–46.0)
Hemoglobin: 11.9 g/dL — ABNORMAL LOW (ref 12.0–15.0)
MCH: 29.8 pg (ref 26.0–34.0)
MCHC: 33.3 g/dL (ref 30.0–36.0)
MCV: 89.3 fL (ref 78.0–100.0)
PLATELETS: 350 10*3/uL (ref 150–400)
RBC: 4 MIL/uL (ref 3.87–5.11)
RDW: 15.6 % — AB (ref 11.5–15.5)
WBC: 12.7 10*3/uL — AB (ref 4.0–10.5)

## 2017-01-05 MED ORDER — METRONIDAZOLE 500 MG PO TABS: 500.0000 mg | ORAL_TABLET | Freq: Three times a day (TID) | ORAL | 0 refills | Status: DC

## 2017-01-05 MED ORDER — CIPROFLOXACIN HCL 500 MG PO TABS: 500.0000 mg | ORAL_TABLET | Freq: Two times a day (BID) | ORAL | 0 refills | Status: DC

## 2017-01-05 NOTE — Evaluation (Signed)
Physical Therapy Evaluation Patient Details Name: Melody Mitchell MRN: [id number] DOB: 1941/07/31 Today's Date: 01/05/2017   History of Present Illness   76 y.o. female, w hx of hyperlipidemia, who presents w c/o congestion about 1 week ago , when seen at [facility] and was tx with amoxicillin and then developed n/v (no blood) then following day. Pt then apparently felt slightly better, but today she developed constipation, and nausea.  She went back to [facility] today and was sent to ER for evaluation.   In ED, pt noted to be hypokalemic, and bradycardic.  Pt had wbc elevation of 19.9 and CT abd/ pelvis revealed that she had colitis.  Pt admits to some bilateral lower abdominal discomfort.  Denies fever, cp, palp, sob, diarrhea, brbpr.      Clinical Impression  Pt received in bed, and agreeable to PT evaluation.  Pt lives alone, and is normally independent with unlimited community ambulation.  She is independent with dressing and bathing.  She does not drive but her sister and neighbors help her with running errands.  During PT evaluation today, pt c/o dizziness, therefore BP obtained:  Sitting: 95/69, HR: 103bpm Standing: 94/76, HR: 97bpm After ambulation -sitting in the chair: 96/68, HR: 101bpm.  Pt required Min A for gait x 58ft with HHA and was reaching out for the wall with the other hand to stabilize herself.  Gait distance was limited due to extreme fatigue with pt rating her rate of perceived exertion as a 10/10 on the modified BORG scale.    At this point, she requires increased assistance for all functional mobility tasks, and would benefit from HHPT upon d/c.  She lives alone, but states she would be able to have assistance upon d/c - however she did not state who would provide it.  She may also need a RW upon d/c.  Will continue to assess need during future tx's.      Follow Up Recommendations Home health PT;Supervision/Assistance - 24 hour     Equipment Recommendations  Other (comment) (Will continue to assess, may possibly need a RW.  )    Recommendations for Other Services       Precautions / Restrictions Precautions Precautions: Fall Precaution Comments: Due to unsteadiness with gait and diminished gait speed.  Restrictions Weight Bearing Restrictions: No      Mobility  Bed Mobility Overal bed mobility: Modified Independent Bed Mobility: Supine to Sit     Supine to sit: Modified independent (Device/Increase time)        Transfers Overall transfer level: Needs assistance Equipment used: 1 person hand held assist Transfers: Sit to/from Stand Sit to Stand: Min assist            Ambulation/Gait Ambulation/Gait assistance: Min assist Ambulation Distance (Feet): 20 Feet Assistive device: 1 person hand held assist Gait Pattern/deviations: Trunk flexed;Step-through pattern;Wide base of support   Gait velocity interpretation: <1.8 ft/sec, indicative of risk for recurrent falls General Gait Details: Pt unsteady on her feet, and reaching for the wall to stabilize herself on the other side.  PT providing HHA on the right.  Pt c/o dizzines throughout standing and gait.  She reports that her rate of perceived exertion on modified BORG is 10/10  Stairs            Wheelchair Mobility    Modified Rankin (Stroke Patients Only)       Balance Overall balance assessment: Needs assistance Sitting-balance support: Bilateral upper extremity supported;Feet  supported Sitting balance-Leahy Scale: Good     Standing balance support: Bilateral upper extremity supported Standing balance-Leahy Scale: Poor                               Pertinent Vitals/Pain Pain Assessment: 0-10 Pain Location: Abdominal pain Pain Descriptors / Indicators: Cramping Pain Intervention(s): Limited activity within patient's tolerance;Monitored during session;Repositioned    Home Living   Living Arrangements:  Alone Available Help at Discharge: Family (sister )   Home Access: Level entry     Home Layout: One level Home Equipment: Toilet riser;Shower seat      Prior Production designer, theatre/television/film / Transfers Assistance Needed: unlimited community ambulator.    ADL's / Homemaking Assistance Needed: sister and neighbors assist with driving.  Independent with dressing and bathing.          Hand Dominance   Dominant Hand: Right    Extremity/Trunk Assessment   Upper Extremity Assessment Upper Extremity Assessment: Overall WFL for tasks assessed    Lower Extremity Assessment Lower Extremity Assessment: Generalized weakness       Communication   Communication: No difficulties  Cognition Arousal/Alertness: Awake/alert Behavior During Therapy: WFL for tasks assessed/performed Overall Cognitive Status: Within Functional Limits for tasks assessed                      General Comments      Exercises     Assessment/Plan    PT Assessment Patient needs continued PT services  PT Problem List Decreased strength;Decreased activity tolerance;Decreased balance;Decreased mobility       PT Treatment Interventions DME instruction;Gait training;Functional mobility training;Therapeutic activities;Therapeutic exercise;Balance training    PT Goals (Current goals can be found in the Care Plan section)  Acute Rehab PT Goals Patient Stated Goal: Pt wants to get stronger.  PT Goal Formulation: With patient Time For Goal Achievement: 01/12/17 Potential to Achieve Goals: Good    Frequency Min 3X/week   Barriers to discharge Decreased caregiver support Pt lives alone - pt states that she could have assistance upon d/c, but does not give details on who would provide this.     Co-evaluation               End of Session Equipment Utilized During Treatment: Gait belt Activity Tolerance: Patient limited by fatigue Patient left: in chair;with call bell/phone within reach Nurse  Communication: Mobility status (Mobility sheet left in pt's room. ) PT Visit Diagnosis: Unsteadiness on feet (R26.81);Muscle weakness (generalized) (M62.81)    Functional Assessment Tool Used: AM-PAC 6 Clicks Basic Mobility Functional Limitation: Mobility: Walking and moving around Mobility: Walking and Moving Around Current Status [id number]): At least 20 percent but less than 40 percent impaired, limited or restricted Mobility: Walking and Moving Around Goal Status [phone]): At least 1 percent but less than 20 percent impaired, limited or restricted    Time: 1031-1055 PT Time Calculation (min) (ACUTE ONLY): 24 min   Charges:   PT Evaluation $PT Eval Low Complexity: 1 Procedure PT Treatments $Gait Training: 8-22 mins   PT G Codes:   PT G-Codes **NOT FOR INPATIENT CLASS** Functional Assessment Tool Used: AM-PAC 6 Clicks Basic Mobility Functional Limitation: Mobility: Walking and moving around Mobility: Walking and Moving Around Current Status [id number]): At least 20 percent but less than 40 percent impaired, limited or restricted Mobility: Walking and Moving Around Goal Status [phone]): At least 1 percent but less than  20 percent impaired, limited or restricted     [employer] 01/05/2017, 11:15 AM

## 2017-01-05 NOTE — Care Management Note (Signed)
Case Management Note  Patient Details  Name: LAQUETTA DELISE MRN: [id number] Date of Birth: [birth year]  Expected Discharge Date:     01/06/2017             Expected Discharge Plan:  [place]  In-House Referral:  NA  Discharge planning Services  CM Consult  Post Acute Care Choice:  Home Health, Durable Medical Equipment Choice offered to:  Patient  DME Arranged:  Walker rolling DME Agency:  [place]:  PT [facility] Agency:  [place]  Status of Service:  Completed, signed off  If discussed at [place] of Stay Meetings, dates discussed:    Additional Comments: Pt is discharging home today. PT has recommended HH PT and RW. pt agreeable and has chosen [facility] from list of [facility] and DME providers. Pt aware that [facility]  Has 48hrs to make first visit. Romualdo Bolk, of [facility], aware of referral and will obtain pt info from chart and deliver DME to pt room prior to DC.   Sherald Barge, RN 01/05/2017, 2:09 PM

## 2017-01-05 NOTE — Discharge Summary (Signed)
Physician Discharge Summary  Melody Mitchell [id number] DOB: 27-Dec-1940 DOA: 01/02/2017  PCP: [place] date: 01/02/2017 Discharge date: 01/05/2017  Admitted From: home Disposition:  home  Recommendations for Outpatient Follow-up:  1. Follow up with PCP in 1-2 weeks 2. Please obtain BMP/CBC in one week 3. Consider outpatient referral to GI if diarrhea persists  Home Health:HHPT Equipment/Devices:Rolling walker  Discharge Condition:stable CODE STATUS: full Diet recommendation: Heart Healthy  Brief/Interim Summary: 76 year old female who presents to the hospital with complaints of abdominal pain and rectal bleeding. CT scan indicated transverse colitis and she also had significant leukocytosis. Patient was started on intravenous antibiotics. Stool studies negative for C. difficile. Treatment with ciprofloxacin and metronidazole has resulted in improvement in condition. Patient does not have any nausea or vomiting. She feels that her abdominal pain, although still present is better than it was on admission. Her pain is controlled by tramadol which she takes at home. She's been advised not to take any NSAIDs. She will continue on a course of antibiotics. Other etiologies include inflammatory bowel disease. She would benefit from follow-up with gastroenterology. Will defer referral to primary care physician. Patient appears to be stable at this point and can likely continue treatment at home. She'll be discharged home today.  Discharge Diagnoses:  Principal Problem:   Colitis Active Problems:   Hypokalemia   Aortic atherosclerosis [facility])    Discharge Instructions  Discharge Instructions    Diet - low sodium heart healthy    Complete by:  As directed    Increase activity slowly    Complete by:  As directed      Allergies as of 01/05/2017      Reactions   Latex Hives, Itching, Rash   Sulfa Antibiotics Hives, Itching   Sulfonamide Derivatives Hives,  Itching      Medication List    STOP taking these medications   celecoxib 100 MG capsule Commonly known as:  CELEBREX   GOODY HEADACHE PO     TAKE these medications   amitriptyline 25 MG tablet Commonly known as:  ELAVIL Take 25 mg by mouth at bedtime as needed for sleep.   Cetirizine HCl 10 MG Caps Take 1 capsule by mouth daily.   ciprofloxacin 500 MG tablet Commonly known as:  CIPRO Take 1 tablet (500 mg total) by mouth 2 (two) times daily.   escitalopram 10 MG tablet Commonly known as:  LEXAPRO Take 10 mg by mouth daily as needed (depression).   esomeprazole 40 MG capsule Commonly known as:  NEXIUM Take 40 mg by mouth daily before breakfast.   fish oil-omega-3 fatty acids 1000 MG capsule Take 1 g by mouth daily.   fluticasone 50 MCG/ACT nasal spray Commonly known as:  FLONASE Place 2 sprays into both nostrils daily.   meclizine 25 MG tablet Commonly known as:  ANTIVERT Take 1 tablet (25 mg total) by mouth 3 (three) times daily as needed for dizziness.   methylcellulose 1 % ophthalmic solution Commonly known as:  ARTIFICIAL TEARS Place 1 drop into both eyes 2 (two) times daily as needed (dry eyes).   metroNIDAZOLE 500 MG tablet Commonly known as:  FLAGYL Take 1 tablet (500 mg total) by mouth every 8 (eight) hours.   multivitamin capsule Take 1 capsule by mouth daily.   ondansetron 4 MG disintegrating tablet Commonly known as:  ZOFRAN ODT Take 1 tablet (4 mg total) by mouth every 8 (eight) hours as needed for nausea or vomiting.  potassium chloride SA 20 MEQ tablet Commonly known as:  K-DUR,KLOR-CON Take 2 tablets (40 mEq total) by mouth daily.   Simethicone 125 MG Caps Take 1 capsule (125 mg total) by mouth as needed (max 6 capsules per 24 hours).   traMADol 50 MG tablet Commonly known as:  ULTRAM Take 50 mg by mouth daily as needed for moderate pain.   triamcinolone cream 0.1 % Commonly known as:  KENALOG Apply 1 application topically daily  as needed (rash).   ZETIA 10 MG tablet Generic drug:  ezetimibe Take 10 mg by mouth every morning.   zolpidem 10 MG tablet Commonly known as:  AMBIEN Take 10 mg by mouth at bedtime as needed for sleep.            Durable Medical Equipment        Start     Ordered   01/05/17 1220  For home use only DME Walker rolling  Once    Question:  Patient needs a walker to treat with the following condition  Answer:  Arthritis   01/05/17 1220     Follow-up Information    [place] Follow up.   Contact information: [place] [address] [phone]          Allergies  Allergen Reactions  . Latex Hives, Itching and Rash  . Sulfa Antibiotics Hives and Itching  . Sulfonamide Derivatives Hives and Itching    Consultations:     Procedures/Studies: Dg Chest 2 View  Result Date: 12/28/2016 CLINICAL DATA:  Cough and nasal congestion EXAM: CHEST  2 VIEW COMPARISON:  06/09/2015 FINDINGS: The heart size and mediastinal contours are within normal limits. Both lungs are clear. The visualized skeletal structures are unremarkable. IMPRESSION: No active cardiopulmonary disease. Electronically Signed   By: Inez Catalina M.D.   On: 12/28/2016 09:42   Ct Abdomen Pelvis W Contrast  Result Date: 01/02/2017 CLINICAL DATA:  Abdominal pain. EXAM: CT ABDOMEN AND PELVIS WITH CONTRAST TECHNIQUE: Multidetector CT imaging of the abdomen and pelvis was performed using the standard protocol following bolus administration of intravenous contrast. CONTRAST:  15mL ISOVUE-300 IOPAMIDOL (ISOVUE-300) INJECTION 61% COMPARISON:  CT the abdomen pelvis 06/05/2015 FINDINGS: Lower chest: Bilateral lower lobe atelectasis is worse than on the prior exam. The heart size is normal. No significant pleural or pericardial effusion is present. Hepatobiliary: Mild intrahepatic biliary dilation is present. Mild hepatic steatosis is again seen. No discrete lesions are present.  Pancreas: No significant inflammatory changes are present. There is proximal pancreatic duct dilation without significant change or obstruction. The distal pancreas is within normal limits. Spleen: Calcifications are again seen within the spleen. No discrete lesions are present. Adrenals/Urinary Tract: The adrenal glands are within normal limits bilaterally. The kidneys and ureters are unremarkable. The urinary bladder is within normal limits. Stomach/Bowel: The distal esophagus is mildly dilated. The stomach is unremarkable. Small bowel is within normal limits. The appendix is not visualized and may be surgically absent. The terminal small bowel is within normal limits. There is marked wall thickening in the mid and distal transverse colon extending to the splenic flexure. The descending and sigmoid colon are decompressed. There is no free air or abscess. Vascular/Lymphatic: Atherosclerotic calcifications are present in the aorta without aneurysm. No significant retroperitoneal or pelvic adenopathy is present. Reproductive: Hysterectomy is noted. The left ovary is visualized and normal. The right ovary is not visualized and may be surgically absent. Other: No significant free fluid or free air  is present. Musculoskeletal: Rightward curvature of the lumbar spine centered at L2-3. There is marked sclerosis at T11-12 without significant interval change. No other focal lytic or blastic lesions are present. The bony pelvis is intact. IMPRESSION: 1. Diffuse inflammatory change of the transverse colon to the level of splenic flexure compatible with acute nonspecific colitis. 2. No evidence for rupture or abscess. 3. Mild intra and extrahepatic biliary dilation following cholecystectomy. 4. Atherosclerosis. 5. Distal colon is collapsed. 6. Hysterectomy and probable right-sided oophorectomy. Electronically Signed   By: San Morelle M.D.   On: 01/02/2017 18:24        Subjective: Still has some abdominal pain  that is better with tramadol. Tolerating diet. Diarrhea improving.  Discharge Exam: Vitals:   01/05/17 0645 01/05/17 1043  BP: 121/85 96/68  Pulse: 99 (!) 101  Resp: 15   Temp: 98.9 F (37.2 C)    Vitals:   01/04/17 1427 01/04/17 2136 01/05/17 0645 01/05/17 1043  BP: 109/73 119/87 121/85 96/68  Pulse: 97 95 99 (!) 101  Resp: 20 18 15    Temp: 98.5 F (36.9 C) 99.5 F (37.5 C) 98.9 F (37.2 C)   TempSrc: Oral Oral Oral   SpO2: 98% 97% 100%   Weight:   67 kg (147 lb 11.3 oz)   Height:        General: Pt is alert, awake, not in acute distress Cardiovascular: RRR, S1/S2 +, no rubs, no gallops Respiratory: CTA bilaterally, no wheezing, no rhonchi Abdominal: Soft, diffusely tender, ND, bowel sounds + Extremities: no edema, no cyanosis    The results of significant diagnostics from this hospitalization (including imaging, microbiology, ancillary and laboratory) are listed below for reference.     Microbiology: Recent Results (from the past 240 hour(s))  Stool culture (children & immunocomp patients)     Status: None (Preliminary result)   Collection Time: 01/02/17  7:46 PM  Result Value Ref Range Status   Salmonella/Shigella Screen PENDING  Incomplete   Campylobacter Culture PENDING  Incomplete   E coli, Shiga toxin Assay Negative Negative Final    Comment: (NOTE) Performed At: [facility] [place], [place] [id number] Lindon Romp MD [id number]   C difficile quick scan w PCR reflex     Status: None   Collection Time: 01/02/17  7:46 PM  Result Value Ref Range Status   C Diff antigen NEGATIVE NEGATIVE Final   C Diff toxin NEGATIVE NEGATIVE Final   C Diff interpretation No C. difficile detected.  Final     Labs: BNP (last 3 results) No results for input(s): BNP in the last 8760 hours. Basic Metabolic Panel:  Recent Labs Lab 01/02/17 1534 01/03/17 0409 01/04/17 0417  NA 136 133* 135  K 3.0* 4.1 3.5  CL 100* 100* 100*  CO2 24 23  24   GLUCOSE 109* 96 98  BUN 21* 13 9  CREATININE 0.91 0.71 0.84  CALCIUM 9.6 9.0 8.8*   Liver Function Tests:  Recent Labs Lab 01/02/17 1534 01/03/17 0409  AST 25 33  ALT 22 27  ALKPHOS 76 75  BILITOT 0.4 0.6  PROT 7.7 7.4  ALBUMIN 4.0 3.7    Recent Labs Lab 01/02/17 1534  LIPASE 20   No results for input(s): AMMONIA in the last 168 hours. CBC:  Recent Labs Lab 01/02/17 1534 01/03/17 0409 01/04/17 0417 01/05/17 1034  WBC 19.9* 16.7* 14.0* 12.7*  NEUTROABS 17.3* 13.8*  --   --   HGB 12.3 12.2 12.5 11.9*  HCT 37.7 36.9 37.9 35.7*  MCV 88.9 88.7 89.2 89.3  PLT 344 361 329 350   Cardiac Enzymes: No results for input(s): CKTOTAL, CKMB, CKMBINDEX, TROPONINI in the last 168 hours. BNP: Invalid input(s): POCBNP CBG: No results for input(s): GLUCAP in the last 168 hours. D-Dimer No results for input(s): DDIMER in the last 72 hours. Hgb A1c No results for input(s): HGBA1C in the last 72 hours. Lipid Profile No results for input(s): CHOL, HDL, LDLCALC, TRIG, CHOLHDL, LDLDIRECT in the last 72 hours. Thyroid function studies No results for input(s): TSH, T4TOTAL, T3FREE, THYROIDAB in the last 72 hours.  Invalid input(s): FREET3 Anemia work up No results for input(s): VITAMINB12, FOLATE, FERRITIN, TIBC, IRON, RETICCTPCT in the last 72 hours. Urinalysis    Component Value Date/Time   COLORURINE YELLOW 01/02/2017 1820   APPEARANCEUR CLEAR 01/02/2017 1820   LABSPEC >1.046 (H) 01/02/2017 1820   PHURINE 6.0 01/02/2017 1820   GLUCOSEU NEGATIVE 01/02/2017 1820   HGBUR NEGATIVE 01/02/2017 1820   BILIRUBINUR NEGATIVE 01/02/2017 1820   KETONESUR NEGATIVE 01/02/2017 1820   PROTEINUR NEGATIVE 01/02/2017 1820   UROBILINOGEN 0.2 06/05/2015 1357   NITRITE NEGATIVE 01/02/2017 1820   LEUKOCYTESUR NEGATIVE 01/02/2017 1820   Sepsis Labs Invalid input(s): PROCALCITONIN,  WBC,  LACTICIDVEN Microbiology Recent Results (from the past 240 hour(s))  Stool culture (children &  immunocomp patients)     Status: None (Preliminary result)   Collection Time: 01/02/17  7:46 PM  Result Value Ref Range Status   Salmonella/Shigella Screen PENDING  Incomplete   Campylobacter Culture PENDING  Incomplete   E coli, Shiga toxin Assay Negative Negative Final    Comment: (NOTE) Performed At: [facility] [place], [place] [id number] Lindon Romp MD [id number]   C difficile quick scan w PCR reflex     Status: None   Collection Time: 01/02/17  7:46 PM  Result Value Ref Range Status   C Diff antigen NEGATIVE NEGATIVE Final   C Diff toxin NEGATIVE NEGATIVE Final   C Diff interpretation No C. difficile detected.  Final     Time coordinating discharge: Over 30 minutes  SIGNED:   Kathie Dike, MD  [facility] 01/05/2017, 2:39 PM Pager   If 7PM-7AM, please contact night-coverage [email or web address] Password [email or web address]

## 2017-01-05 NOTE — Progress Notes (Signed)
Pt states that her stomach feels a lot better. No sign of pain notice. Pt watching TV with call light within reach.

## 2017-01-05 NOTE — Care Management Note (Signed)
    Durable Medical Equipment        Start     Ordered   01/05/17 1220  For home use only DME Walker rolling  Once    Question:  Patient needs a walker to treat with the following condition  Answer:  Arthritis   01/05/17 1220

## 2017-01-05 NOTE — Progress Notes (Signed)
Patient states understanding of discharge instructions, prescriptions given 

## 2017-01-07 LAB — STOOL CULTURE: E COLI SHIGA TOXIN ASSAY: NEGATIVE

## 2017-01-07 LAB — STOOL CULTURE REFLEX - CMPCXR

## 2017-01-07 LAB — STOOL CULTURE REFLEX - RSASHR

## 2017-01-26 ENCOUNTER — Encounter: Payer: Self-pay | Admitting: Gastroenterology

## 2017-02-02 ENCOUNTER — Ambulatory Visit (INDEPENDENT_AMBULATORY_CARE_PROVIDER_SITE_OTHER): Payer: Medicare Other | Admitting: Nurse Practitioner

## 2017-02-02 ENCOUNTER — Encounter: Payer: Self-pay | Admitting: Nurse Practitioner

## 2017-02-02 ENCOUNTER — Encounter: Payer: Self-pay | Admitting: Gastroenterology

## 2017-02-02 DIAGNOSIS — K625 Hemorrhage of anus and rectum: Secondary | ICD-10-CM | POA: Insufficient documentation

## 2017-02-02 DIAGNOSIS — R935 Abnormal findings on diagnostic imaging of other abdominal regions, including retroperitoneum: Secondary | ICD-10-CM | POA: Diagnosis not present

## 2017-02-02 NOTE — Assessment & Plan Note (Signed)
Noted rectal bleeding associated with abdominal pain which led to her admission for colitis. Abdominal pain and rectal bleeding have resolved at this point after antibiotic therapy. Abnormal CT imaging as noted below, query need to repeat colonoscopy. We'll discuss with Dr. Oneida Alar and notify the patient.

## 2017-02-02 NOTE — Progress Notes (Addendum)
REVIEWED. PT HAD ACUTE ONSET OF SYMPTOMS THAT HAVE RESOLVED. TCS WITHIN 3 YEARS. UNCOMPLICATED ACUTE COLITIS. NO INDICATION FOR ENDOSCOPY AT THIS TIME.   Referring Provider: [facility] Primary Care Physician:  [name], MD Primary GI:  Dr. Oneida Alar  Chief Complaint  Patient presents with  . Blood In Stools    history of polyps    HPI:   Melody Mitchell is a 76 y.o. female who presents On referral from primary care status post hospital discharge for colitis. Last seen by primary care 01/26/2017 which noted admission for colitis and the transverse colon with a history of polyps. Emergency room EKG and telemetry normal. Noted bright red blood per rectum with CT demonstrating wall thickening transverse colon, stool cultures negative, discharged on Flagyl.  Hospital admission records reviewed. The patient was admitted to the hospital 01/02/2017 through 01/05/2017 for abdominal pain, rectal bleeding. Noted leukocytosis. Started on IV antibiotics. C. difficile negative, started on Cipro and Flagyl with improvement. Noted improvement in abdominal pain. Other etiologies include inflammatory bowel disease and would benefit from follow-up with GI.  She was last seen in our office 11/18/2015 for GERD, heme positive stool, iron deficiency anemia. At that time she was doing well with no further bleeding or abdominal pain. GERD symptoms "really good" on PPI. No other GI symptoms.  Patient with change in bowel habits and hematochezia with colonoscopy completed 06/29/2015. Findings included redundant colon, 4 colon polyps removed, rectal bleeding likely due to large internal hemorrhoids, no source for change in bowel habits identified. Endoscopy completed same day which found small hiatal hernia, mild nonerosive gastritis without obvious source for dyspepsia identified. Recommendations include high-fiber/low-fat diet, avoid trigger foods, continue Amitiza and Nexium, Preparation H as needed for  hemorrhoid symptoms, next colonoscopy in 10-15 years if the benefits outweigh the risks. Surgical pathology found colon polyps to be 2 tubular adenomas and one benign polypoid mucosa. Duodenum biopsy benign small bowel mucosa, stomach biopsy chronic inactive gastritis.  Today she states she feels much better overall, but not optimal. Finished her antibiotics. No further abdominal pain. No further hematochezia since discharge. Denies N/V, fever, chills, unintentional weight loss. GERD still well managed on PPI. Stools are dark on iron and vitamins. Denies chest pain, dyspnea, dizziness, lightheadedness, syncope, near syncope. Denies any other upper or lower GI symptoms.  Past Medical History:  Diagnosis Date  . Acid reflux   . Arthritis   . 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 Bronchitis january 2014  . Hiatal hernia   . High cholesterol   . History of contact dermatitis   . Hx: recurrent pneumonia   . Hypertension   . IDA (iron deficiency anemia)    chronic iron infusions  . Reflux   . Vertigo     Past Surgical History:  Procedure Laterality Date  . ABDOMINAL HYSTERECTOMY    . APPENDECTOMY    . BILATERAL BREAST BIOPSIES  BENIGN    . CHOLECYSTECTOMY    . COLONOSCOPY N/A 06/29/2015   SLF: The left colon is redundant 2. four colon polyps removed. No source  for change in bowel habits identified 3. Rectal bleeding due to large internal hemorrhoids   . COLONOSCOPY W/ BIOPSIES  6 2010   Dr. Oneida Alar: Polypoid appearing lesion of the ascending colon, 3 mm sessile rectal polyp, small internal hemorrhoids. Pathology revealed polypoid mucosa, no adenomatous changes  . Double-balloon enteroscopy, antegrade  April 2011   Dr. 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  Marland Kitchen ESOPHAGOGASTRODUODENOSCOPY  04/2009   Dr. Oneida Alar: Patent Schatzki ring dilated with advancing the scope, patchy erythema in the antrum, 2 small AVMs in the duodenal bulb,  2 additional AVMs noted in the second portion the duodenum, mild gastritis on pathology  . ESOPHAGOGASTRODUODENOSCOPY N/A 06/29/2015   SLF: 1. 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 small hiatal hernia 3. Mild non-erosive gastririts- NO obvious source for dyspepsia identified.   Marland Kitchen EYE SURGERY      Current Outpatient Prescriptions  Medication Sig Dispense Refill  . amitriptyline (ELAVIL) 25 MG tablet Take 25 mg by mouth at bedtime as needed for sleep.     . Cetirizine HCl 10 MG CAPS Take 1 capsule by mouth daily.    Marland Kitchen escitalopram (LEXAPRO) 10 MG tablet Take 10 mg by mouth daily as needed (depression).     Marland Kitchen esomeprazole (NEXIUM) 40 MG capsule Take 40 mg by mouth daily before breakfast.      . fish oil-omega-3 fatty acids 1000 MG capsule Take 1 g by mouth daily.     . fluticasone (FLONASE) 50 MCG/ACT nasal spray Place 2 sprays into both nostrils daily.     . meclizine (ANTIVERT) 25 MG tablet Take 1 tablet (25 mg total) by mouth 3 (three) times daily as needed for dizziness. 30 tablet 0  . methylcellulose (ARTIFICIAL TEARS) 1 % ophthalmic solution Place 1 drop into both eyes 2 (two) times daily as needed (dry eyes).    . montelukast (SINGULAIR) 10 MG tablet Take 10 mg by mouth at bedtime.    . Multiple Vitamin (MULTIVITAMIN) capsule Take 1 capsule by mouth daily.      . traMADol (ULTRAM) 50 MG tablet Take 50 mg by mouth daily as needed for moderate pain.     Marland Kitchen ZETIA 10 MG tablet Take 10 mg by mouth every morning.    . triamcinolone cream (KENALOG) 0.1 % Apply 1 application topically daily as needed (rash).      No current facility-administered medications for this visit.     Allergies as of 02/02/2017 - Review Complete 02/02/2017  Allergen Reaction Noted  . Latex Hives, Itching, and Rash 07/01/2009  . Sulfa antibiotics Hives and Itching 11/21/2013  . Sulfonamide derivatives Hives and Itching     Family History  Problem Relation Age of Onset  . Heart failure Mother   . Diabetes  Sister   . Heart attack Father   . Colon cancer Neg Hx     Social History   Social History  . Marital status: Divorced    Spouse name: N/A  . Number of children: 1  . Years of education: N/A   Social History Main Topics  . Smoking status: Former Smoker    Packs/day: 1.00    Years: 25.00    Types: Cigarettes    Quit date: 09/14/2007  . Smokeless tobacco: Never Used  . Alcohol use No  . Drug use: No  . Sexual activity: Yes    Birth control/ protection: Post-menopausal, Surgical   Other Topics Concern  . Not on file   Social History Narrative  . No narrative on file    Review of Systems: Complete ROS negative except as per HPI.   Physical Exam: BP 126/84   Pulse 95   Temp 98.1 F (36.7 C) (Oral)   Ht 5\' 4"  (1.626 m)   Wt 148 lb 9.6 oz (67.4 kg)   BMI 25.51 kg/m  General:   Alert and oriented. Pleasant and cooperative. Well-nourished and well-developed.  Head:  Normocephalic and atraumatic. Eyes:  Without icterus, sclera clear and conjunctiva pink.  Ears:  Normal auditory acuity. Cardiovascular:  S1, S2 present without murmurs appreciated. Extremities without clubbing or edema. Respiratory:  Clear to auscultation bilaterally. No wheezes, rales, or rhonchi. No distress.  Gastrointestinal:  +BS, soft, non-tender and non-distended. No HSM noted. No guarding or rebound. No masses appreciated.  Rectal:  Deferred  Musculoskalatal:  Symmetrical without gross deformities. Neurologic:  Alert and oriented x4;  grossly normal neurologically. Psych:  Alert and cooperative. Normal mood and affect. Heme/Lymph/Immune: No excessive bruising noted.    02/02/2017 11:34 AM   Disclaimer: This note was dictated with voice recognition software. Similar sounding words can inadvertently be transcribed and may not be corrected upon review.

## 2017-02-02 NOTE — Progress Notes (Signed)
cc'ed to pcp °

## 2017-02-02 NOTE — Patient Instructions (Signed)
1. I will contact Dr. Oneida Alar about whether or not to proceed with colonoscopy. When we know for sure, we will contact you. 2. Return for follow-up in 3 months. 3. Call us if you have any worsening symptoms.

## 2017-02-02 NOTE — Assessment & Plan Note (Addendum)
During recent admission for abdominal pain and rectal bleeding, CT of the abdomen noted wall thickening of the transverse colon through hepatic flexure as nonspecific colitis. History of colon polyps. She was placed on antibiotics and has had subsequent improvement. Query need to repeat colonoscopy to ensure no other inflammatory or polypoid process going on. Last colonoscopy was 2 years ago with 4 colon polyps removed and recommended next colonoscopy in 10 years if benefits outweigh risks.  We will discuss with Dr. Oneida Alar and notify the patient. She is agreeable and would like to proceed with colonoscopy if is justified. I have heart he discussed risks and benefits with her.

## 2017-02-13 NOTE — Progress Notes (Signed)
Please call the patient and tell her I reviewed her visit with Dr. Oneida Alar and there is no need for a repeat colonoscopy at this time. She cal call if she has any questions.

## 2017-02-13 NOTE — Progress Notes (Signed)
PT is aware.

## 2017-02-27 ENCOUNTER — Other Ambulatory Visit (HOSPITAL_COMMUNITY): Payer: Self-pay | Admitting: Oncology

## 2017-02-27 ENCOUNTER — Encounter (HOSPITAL_COMMUNITY): Payer: Medicare Other | Attending: Oncology

## 2017-02-27 DIAGNOSIS — D500 Iron deficiency anemia secondary to blood loss (chronic): Secondary | ICD-10-CM | POA: Insufficient documentation

## 2017-02-27 DIAGNOSIS — E876 Hypokalemia: Secondary | ICD-10-CM

## 2017-02-27 LAB — COMPREHENSIVE METABOLIC PANEL
ALBUMIN: 3.7 g/dL (ref 3.5–5.0)
ALT: 17 U/L (ref 14–54)
AST: 25 U/L (ref 15–41)
Alkaline Phosphatase: 63 U/L (ref 38–126)
Anion gap: 9 (ref 5–15)
BUN: 9 mg/dL (ref 6–20)
CHLORIDE: 103 mmol/L (ref 101–111)
CO2: 28 mmol/L (ref 22–32)
Calcium: 9 mg/dL (ref 8.9–10.3)
Creatinine, Ser: 0.77 mg/dL (ref 0.44–1.00)
GFR calc Af Amer: >60 mL/min (ref >60)
GFR calc non Af Amer: >60 mL/min (ref >60)
Glucose, Bld: 92 mg/dL (ref 65–99)
Potassium: 3.2 mmol/L — ABNORMAL LOW (ref 3.5–5.1)
Sodium: 140 mmol/L (ref 135–145)
TOTAL PROTEIN: 7.1 g/dL (ref 6.5–8.1)
Total Bilirubin: 0.1 mg/dL — ABNORMAL LOW (ref 0.3–1.2)

## 2017-02-27 LAB — CBC WITH DIFFERENTIAL/PLATELET
BASOS ABS: 0 10*3/uL (ref 0.0–0.1)
BASOS PCT: 0 %
EOS PCT: 3 %
Eosinophils Absolute: 0.2 10*3/uL (ref 0.0–0.7)
HCT: 34.7 % — ABNORMAL LOW (ref 36.0–46.0)
Hemoglobin: 11.1 g/dL — ABNORMAL LOW (ref 12.0–15.0)
Lymphocytes Relative: 41 %
Lymphs Abs: 1.9 10*3/uL (ref 0.7–4.0)
MCH: 29.4 pg (ref 26.0–34.0)
MCHC: 32 g/dL (ref 30.0–36.0)
MCV: 92 fL (ref 78.0–100.0)
MONO ABS: 0.4 10*3/uL (ref 0.1–1.0)
Monocytes Relative: 8 %
Neutro Abs: 2.2 10*3/uL (ref 1.7–7.7)
Neutrophils Relative %: 48 %
PLATELETS: 322 10*3/uL (ref 150–400)
RBC: 3.77 MIL/uL — AB (ref 3.87–5.11)
RDW: 15.1 % (ref 11.5–15.5)
WBC: 4.6 10*3/uL (ref 4.0–10.5)

## 2017-02-27 LAB — IRON AND TIBC
IRON: 34 ug/dL (ref 28–170)
SATURATION RATIOS: 12 % (ref 10.4–31.8)
TIBC: 277 ug/dL (ref 250–450)
UIBC: 243 ug/dL

## 2017-02-27 LAB — FERRITIN: Ferritin: 41 ng/mL (ref 11–307)

## 2017-02-27 MED ORDER — POTASSIUM CHLORIDE CRYS ER 20 MEQ PO TBCR
20.0000 meq | EXTENDED_RELEASE_TABLET | Freq: Two times a day (BID) | ORAL | 0 refills | Status: DC
Start: 2017-02-27 — End: 2019-09-06

## 2017-03-01 ENCOUNTER — Encounter (HOSPITAL_COMMUNITY): Payer: Self-pay

## 2017-03-01 ENCOUNTER — Encounter (HOSPITAL_BASED_OUTPATIENT_CLINIC_OR_DEPARTMENT_OTHER): Payer: Medicare Other

## 2017-03-01 ENCOUNTER — Ambulatory Visit (HOSPITAL_COMMUNITY)
Admission: RE | Admit: 2017-03-01 | Discharge: 2017-03-01 | Disposition: A | Discharge status: Home / Self Care | Payer: Medicare Other | Source: Ambulatory Visit | Attending: Oncology | Admitting: Oncology

## 2017-03-01 VITALS — BP 137/87 | HR 76 | Temp 98.3°F | Resp 18

## 2017-03-01 DIAGNOSIS — D500 Iron deficiency anemia secondary to blood loss (chronic): Secondary | ICD-10-CM | POA: Diagnosis not present

## 2017-03-01 DIAGNOSIS — Z1231 Encounter for screening mammogram for malignant neoplasm of breast: Secondary | ICD-10-CM | POA: Insufficient documentation

## 2017-03-01 MED ORDER — SODIUM CHLORIDE 0.9 % IV SOLN
510.0000 mg | Freq: Once | INTRAVENOUS | Status: AC
  Administered 2017-03-01: 510 mg via INTRAVENOUS
  Filled 2017-03-01: qty 17

## 2017-03-01 MED ORDER — SODIUM CHLORIDE 0.9 % IV SOLN
Freq: Once | INTRAVENOUS | Status: AC
  Administered 2017-03-01: 14:00:00 via INTRAVENOUS

## 2017-03-01 NOTE — Progress Notes (Signed)
Melody Mitchell tolerated [name] infusion well without complaints or incident. VSS upon discharge. Pt discharged self ambulatory in satisfactory condition accompanied by family member

## 2017-03-01 NOTE — Patient Instructions (Signed)
[facility] at [facility] Discharge Instructions  RECOMMENDATIONS MADE BY THE CONSULTANT AND ANY TEST RESULTS WILL BE SENT TO YOUR REFERRING PHYSICIAN.  Received [name] infusion today.Follow-up as scheduled. Call clinic for any questions or concerns  Thank you for choosing [facility] at [facility] to provide your oncology and hematology care.  To afford each patient quality time with our provider, please arrive at least 15 minutes before your scheduled appointment time.    If you have a lab appointment with the Cancer Center please come in thru the  Main Entrance and check in at the main information desk  You need to re-schedule your appointment should you arrive 10 or more minutes late.  We strive to give you quality time with our providers, and arriving late affects you and other patients whose appointments are after yours.  Also, if you no show three or more times for appointments you may be dismissed from the clinic at the providers discretion.     Again, thank you for choosing [facility].  Our hope is that these requests will decrease the amount of time that you wait before being seen by our physicians.       _____________________________________________________________  Should you have questions after your visit to [facility], please contact our office at [phone] between the hours of 8:30 a.m. and 4:30 p.m.  Voicemails left after 4:30 p.m. will not be returned until the following business day.  For prescription refill requests, have your pharmacy contact our office.       Resources For Cancer Patients and their Caregivers ? American Cancer Society: Can assist with transportation, wigs, general needs, runs Look Good Feel Better.        1-888-227-6333 ? Cancer Care: Provides financial assistance, online support groups, medication/co-pay assistance.  1-800-813-HOPE (4673) ? [facility] Assists [place] cancer patients and their families through emotional , educational and financial support.  336-427-4357 ? [place] DSS Where to apply for food stamps, Medicaid and utility assistance. 336-342-1394 ? RCATS: Transportation to medical appointments. 336-347-2287 ? Social Security Administration: May apply for disability if have a Stage IV cancer. 336-342-7796 1-800-772-1213 ? [place] [facility], Disability and Transit Services: Assists with nutrition, care and transit needs. 336-349-2343  Cancer Center Support Programs: @10RELATIVEDAYS@ > Cancer Support Group  2nd Tuesday of the month 1pm-2pm, Journey Room  > Creative Journey  3rd Tuesday of the month 1130am-1pm, Journey Room  > Look Good Feel Better  1st Wednesday of the month 10am-12 noon, Journey Room (Call American [employer] to register 1-800-395-5775)   

## 2017-03-09 ENCOUNTER — Ambulatory Visit (HOSPITAL_COMMUNITY): Payer: Medicare Other

## 2017-05-04 ENCOUNTER — Encounter: Payer: Self-pay | Admitting: Nurse Practitioner

## 2017-05-04 ENCOUNTER — Ambulatory Visit (INDEPENDENT_AMBULATORY_CARE_PROVIDER_SITE_OTHER): Payer: Medicare Other | Admitting: Nurse Practitioner

## 2017-05-04 VITALS — BP 129/89 | HR 75 | Temp 98.9°F | Ht 64.0 in | Wt 153.4 lb

## 2017-05-04 DIAGNOSIS — K529 Noninfective gastroenteritis and colitis, unspecified: Secondary | ICD-10-CM | POA: Diagnosis not present

## 2017-05-04 DIAGNOSIS — K625 Hemorrhage of anus and rectum: Secondary | ICD-10-CM | POA: Diagnosis not present

## 2017-05-04 DIAGNOSIS — K219 Gastro-esophageal reflux disease without esophagitis: Secondary | ICD-10-CM

## 2017-05-04 NOTE — Progress Notes (Signed)
cc'ed to pcp °

## 2017-05-04 NOTE — Progress Notes (Signed)
Referring Provider: [name], MD Primary Care Physician:  [name], MD Primary GI:  Dr. Oneida Alar  Chief Complaint  Patient presents with  . Rectal Bleeding    f/u, doing ok    HPI:   Melody Mitchell is a 76 y.o. female who presents For follow-up on rectal bleeding. She was last seen in our office 02/02/2017 after hospitalization and abnormal CT demonstrating wall thickening of the transverse colon, negative stool cultures.  Patient with change in bowel habits and hematochezia with colonoscopy completed 06/29/2015. Findings included redundant colon, 4 colon polyps removed, rectal bleeding likely due to large internal hemorrhoids, no source for change in bowel habits identified. Endoscopy completed same day which found small hiatal hernia, mild nonerosive gastritis without obvious source for dyspepsia identified. Recommendations include high-fiber/low-fat diet, avoid trigger foods, continue Amitiza and Nexium, Preparation H as needed for hemorrhoid symptoms, next colonoscopy in 10-15 years if the benefits outweigh the risks. Surgical pathology found colon polyps to be 2 tubular adenomas and one benign polypoid mucosa. Duodenum biopsy benign small bowel mucosa, stomach biopsy chronic inactive gastritis.  At her last visit she was doing much better overall, but not optimal. Finished antibiotics, no further abdominal pain or hematochezia. GERD well managed on PPI. Stools dark with iron and vitamins. No other GI symptoms. Case discussed with Dr. Oneida Alar and noted acute onset of symptoms that have resolved and colonoscopy within the past 3 years. Likely uncomplicated acute colitis and no indication for endoscopic evaluation.  Today she states she's doing well. No further abdominal pain. No further hematochezia. Energy is good. Participates in [facility] at the senior center twice a week. Denies melena, fever, chills, unintentional weight loss. GERD still well controlled on Nexium, no significant  breakthrough symptoms. Denies chest pain, dyspnea, dizziness, lightheadedness, syncope, near syncope. Denies any other upper or lower GI symptoms.  Past Medical History:  Diagnosis Date  . Acid reflux   . Arthritis   . 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 Bronchitis january 2014  . Hiatal hernia   . High cholesterol   . History of contact dermatitis   . Hx: recurrent pneumonia   . Hypertension   . IDA (iron deficiency anemia)    chronic iron infusions  . Reflux   . Vertigo     Past Surgical History:  Procedure Laterality Date  . ABDOMINAL HYSTERECTOMY    . APPENDECTOMY    . BILATERAL BREAST BIOPSIES  BENIGN    . CHOLECYSTECTOMY    . COLONOSCOPY N/A 06/29/2015   SLF: The left colon is redundant 2. four colon polyps removed. No source  for change in bowel habits identified 3. Rectal bleeding due to large internal hemorrhoids   . COLONOSCOPY W/ BIOPSIES  6 2010   Dr. Oneida Alar: Polypoid appearing lesion of the ascending colon, 3 mm sessile rectal polyp, small internal hemorrhoids. Pathology revealed polypoid mucosa, no adenomatous changes  . Double-balloon enteroscopy, antegrade  April 2011   Dr. 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  Marland Kitchen ESOPHAGOGASTRODUODENOSCOPY  04/2009   Dr. Oneida Alar: Patent Schatzki ring dilated with advancing the scope, patchy erythema in the antrum, 2 small AVMs in the duodenal bulb, 2 additional AVMs noted in the second portion the duodenum, mild gastritis on pathology  . ESOPHAGOGASTRODUODENOSCOPY N/A 06/29/2015   SLF: 1. 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 small hiatal hernia 3. Mild non-erosive gastririts- NO obvious source for dyspepsia identified.   Marland Kitchen  EYE SURGERY      Current Outpatient Prescriptions  Medication Sig Dispense Refill  . amitriptyline (ELAVIL) 25 MG tablet Take 25 mg by mouth at bedtime as needed for sleep.     . Cetirizine HCl 10 MG CAPS Take 1 capsule by mouth  daily.    Marland Kitchen escitalopram (LEXAPRO) 10 MG tablet Take 10 mg by mouth daily as needed (depression).     Marland Kitchen esomeprazole (NEXIUM) 40 MG capsule Take 40 mg by mouth daily before breakfast.      . fish oil-omega-3 fatty acids 1000 MG capsule Take 1 g by mouth daily.     . fluticasone (FLONASE) 50 MCG/ACT nasal spray Place 2 sprays into both nostrils daily.     . meclizine (ANTIVERT) 25 MG tablet Take 1 tablet (25 mg total) by mouth 3 (three) times daily as needed for dizziness. 30 tablet 0  . methylcellulose (ARTIFICIAL TEARS) 1 % ophthalmic solution Place 1 drop into both eyes 2 (two) times daily as needed (dry eyes).    . montelukast (SINGULAIR) 10 MG tablet Take 10 mg by mouth as needed.     . Multiple Vitamin (MULTIVITAMIN) capsule Take 1 capsule by mouth daily.      . potassium chloride SA (K-DUR,KLOR-CON) 20 MEQ tablet Take 1 tablet (20 mEq total) by mouth 2 (two) times daily. 30 tablet 0  . traMADol (ULTRAM) 50 MG tablet Take 50 mg by mouth daily as needed for moderate pain.     Marland Kitchen triamcinolone cream (KENALOG) 0.1 % Apply 1 application topically daily as needed (rash).     Marland Kitchen ZETIA 10 MG tablet Take 10 mg by mouth every morning.     No current facility-administered medications for this visit.     Allergies as of 05/04/2017 - Review Complete 05/04/2017  Allergen Reaction Noted  . Latex Hives, Itching, and Rash 07/01/2009  . Sulfa antibiotics Hives and Itching 11/21/2013  . Sulfonamide derivatives Hives and Itching     Family History  Problem Relation Age of Onset  . Heart failure Mother   . Diabetes Sister   . Heart attack Father   . Colon cancer Neg Hx     Social History   Social History  . Marital status: Divorced    Spouse name: N/A  . Number of children: 1  . Years of education: N/A   Social History Main Topics  . Smoking status: Former Smoker    Packs/day: 1.00    Years: 25.00    Types: Cigarettes    Quit date: 09/14/2007  . Smokeless tobacco: Never Used  . Alcohol  use Yes     Comment: occasional/rare (Brandy every now and then)  . Drug use: No  . Sexual activity: Yes    Birth control/ protection: Post-menopausal, Surgical   Other Topics Concern  . None   Social History Narrative  . None    Review of Systems: Complete ROS negative except as per HPI.   Physical Exam: BP 129/89   Pulse 75   Temp 98.9 F (37.2 C) (Oral)   Ht 5\' 4"  (1.626 m)   Wt 153 lb 6.4 oz (69.6 kg)   BMI 26.33 kg/m  General:   Alert and oriented. Pleasant and cooperative. Well-nourished and well-developed.  Eyes:  Without icterus, sclera clear and conjunctiva pink.  Ears:  Normal auditory acuity. Cardiovascular:  S1, S2 present without murmurs appreciated. Extremities without clubbing or edema. Respiratory:  Clear to auscultation bilaterally. No wheezes, rales, or rhonchi. No  distress.  Gastrointestinal:  +BS, rounded but soft, non-tender and non-distended. No HSM noted. No guarding or rebound. No masses appreciated.  Rectal:  Deferred  Musculoskalatal:  Symmetrical without gross deformities. Neurologic:  Alert and oriented x4;  grossly normal neurologically. Psych:  Alert and cooperative. Normal mood and affect. Heme/Lymph/Immune: No excessive bruising noted.    05/04/2017 11:14 AM   Disclaimer: This note was dictated with voice recognition software. Similar sounding words can inadvertently be transcribed and may not be corrected upon review.

## 2017-05-04 NOTE — Assessment & Plan Note (Signed)
Symptoms resolved, no further symptoms. Continue to monitor. Return for follow-up in 6 months.

## 2017-05-04 NOTE — Assessment & Plan Note (Signed)
GERD currently well-controlled on Nexium. Continue PPI, return for follow-up in 6 months.

## 2017-05-04 NOTE — Patient Instructions (Signed)
1. Continue taking your current medications. 2. Return for follow-up in 6 months. 3. Call us if you have any worsening or returning symptoms before then.

## 2017-05-04 NOTE — Assessment & Plan Note (Signed)
Abdominal pain and hematochezia resolved. She feels good today. No further GI symptoms. Return for follow-up in 6 months, continue to monitor for any new or worsening/recurrent symptoms. Call us if symptoms worsen.

## 2017-05-21 NOTE — Progress Notes (Signed)
REVIEWED-NO ADDITIONAL RECOMMENDATIONS. 

## 2017-05-29 ENCOUNTER — Encounter (HOSPITAL_BASED_OUTPATIENT_CLINIC_OR_DEPARTMENT_OTHER): Payer: Medicare Other | Admitting: Oncology

## 2017-05-29 ENCOUNTER — Ambulatory Visit (HOSPITAL_COMMUNITY): Payer: Medicare Other | Admitting: Oncology

## 2017-05-29 ENCOUNTER — Other Ambulatory Visit (HOSPITAL_COMMUNITY): Payer: Self-pay | Admitting: Oncology

## 2017-05-29 ENCOUNTER — Encounter (HOSPITAL_COMMUNITY): Payer: Medicare Other | Attending: Oncology

## 2017-05-29 ENCOUNTER — Other Ambulatory Visit (HOSPITAL_COMMUNITY): Payer: Medicare Other

## 2017-05-29 ENCOUNTER — Encounter (HOSPITAL_COMMUNITY): Payer: Self-pay | Admitting: Oncology

## 2017-05-29 DIAGNOSIS — I10 Essential (primary) hypertension: Secondary | ICD-10-CM | POA: Insufficient documentation

## 2017-05-29 DIAGNOSIS — Z90710 Acquired absence of both cervix and uterus: Secondary | ICD-10-CM | POA: Insufficient documentation

## 2017-05-29 DIAGNOSIS — D72819 Decreased white blood cell count, unspecified: Secondary | ICD-10-CM | POA: Insufficient documentation

## 2017-05-29 DIAGNOSIS — M25569 Pain in unspecified knee: Secondary | ICD-10-CM

## 2017-05-29 DIAGNOSIS — D500 Iron deficiency anemia secondary to blood loss (chronic): Secondary | ICD-10-CM

## 2017-05-29 DIAGNOSIS — Z8249 Family history of ischemic heart disease and other diseases of the circulatory system: Secondary | ICD-10-CM | POA: Insufficient documentation

## 2017-05-29 DIAGNOSIS — Z833 Family history of diabetes mellitus: Secondary | ICD-10-CM | POA: Diagnosis not present

## 2017-05-29 DIAGNOSIS — Z9889 Other specified postprocedural states: Secondary | ICD-10-CM | POA: Insufficient documentation

## 2017-05-29 DIAGNOSIS — M545 Low back pain: Secondary | ICD-10-CM | POA: Diagnosis not present

## 2017-05-29 DIAGNOSIS — K449 Diaphragmatic hernia without obstruction or gangrene: Secondary | ICD-10-CM | POA: Diagnosis not present

## 2017-05-29 DIAGNOSIS — E7800 Pure hypercholesterolemia, unspecified: Secondary | ICD-10-CM | POA: Insufficient documentation

## 2017-05-29 DIAGNOSIS — Z87891 Personal history of nicotine dependence: Secondary | ICD-10-CM | POA: Diagnosis not present

## 2017-05-29 DIAGNOSIS — K219 Gastro-esophageal reflux disease without esophagitis: Secondary | ICD-10-CM | POA: Insufficient documentation

## 2017-05-29 DIAGNOSIS — Z9049 Acquired absence of other specified parts of digestive tract: Secondary | ICD-10-CM | POA: Diagnosis not present

## 2017-05-29 LAB — CBC WITH DIFFERENTIAL/PLATELET
BASOS ABS: 0 10*3/uL (ref 0.0–0.1)
Basophils Relative: 0 %
Eosinophils Absolute: 0.2 10*3/uL (ref 0.0–0.7)
Eosinophils Relative: 4 %
HEMATOCRIT: 31 % — AB (ref 36.0–46.0)
Hemoglobin: 10 g/dL — ABNORMAL LOW (ref 12.0–15.0)
LYMPHS PCT: 25 %
Lymphs Abs: 1.3 10*3/uL (ref 0.7–4.0)
MCH: 29.4 pg (ref 26.0–34.0)
MCHC: 32.3 g/dL (ref 30.0–36.0)
MCV: 91.2 fL (ref 78.0–100.0)
MONO ABS: 0.4 10*3/uL (ref 0.1–1.0)
MONOS PCT: 8 %
NEUTROS ABS: 3.2 10*3/uL (ref 1.7–7.7)
Neutrophils Relative %: 63 %
PLATELETS: 294 10*3/uL (ref 150–400)
RBC: 3.4 MIL/uL — ABNORMAL LOW (ref 3.87–5.11)
RDW: 13.9 % (ref 11.5–15.5)
WBC: 5.1 10*3/uL (ref 4.0–10.5)

## 2017-05-29 LAB — COMPREHENSIVE METABOLIC PANEL
ALT: 14 U/L (ref 14–54)
AST: 28 U/L (ref 15–41)
Albumin: 3.6 g/dL (ref 3.5–5.0)
Alkaline Phosphatase: 76 U/L (ref 38–126)
Anion gap: 6 (ref 5–15)
BILIRUBIN TOTAL: 0.5 mg/dL (ref 0.3–1.2)
BUN: 9 mg/dL (ref 6–20)
CHLORIDE: 111 mmol/L (ref 101–111)
CO2: 24 mmol/L (ref 22–32)
Calcium: 9.4 mg/dL (ref 8.9–10.3)
Creatinine, Ser: 0.94 mg/dL (ref 0.44–1.00)
GFR calc Af Amer: >60 mL/min (ref >60)
GFR, EST NON AFRICAN AMERICAN: 57 mL/min — AB (ref >60)
Glucose, Bld: 131 mg/dL — ABNORMAL HIGH (ref 65–99)
POTASSIUM: 3.9 mmol/L (ref 3.5–5.1)
Sodium: 141 mmol/L (ref 135–145)
TOTAL PROTEIN: 7 g/dL (ref 6.5–8.1)

## 2017-05-29 LAB — IRON AND TIBC
IRON: 30 ug/dL (ref 28–170)
Saturation Ratios: 10 % — ABNORMAL LOW (ref 10.4–31.8)
TIBC: 288 ug/dL (ref 250–450)
UIBC: 258 ug/dL

## 2017-05-29 LAB — FERRITIN: FERRITIN: 20 ng/mL (ref 11–307)

## 2017-05-29 NOTE — Patient Instructions (Signed)
[place] at [facility] Discharge Instructions  RECOMMENDATIONS MADE BY THE CONSULTANT AND ANY TEST RESULTS WILL BE SENT TO YOUR REFERRING PHYSICIAN.  You were seen today by Kirby Crigler PA-C. Return in 3 months for labs. Return in 6 months for labs and follow up.   Thank you for choosing [place] at [facility] to provide your oncology and hematology care.  To afford each patient quality time with our provider, please arrive at least 15 minutes before your scheduled appointment time.    If you have a lab appointment with the [facility] please come in thru the  Main Entrance and check in at the main information desk  You need to re-schedule your appointment should you arrive 10 or more minutes late.  We strive to give you quality time with our providers, and arriving late affects you and other patients whose appointments are after yours.  Also, if you no show three or more times for appointments you may be dismissed from the clinic at the providers discretion.     Again, thank you for choosing [facility].  Our hope is that these requests will decrease the amount of time that you wait before being seen by our physicians.       _____________________________________________________________  Should you have questions after your visit to [facility], please contact our office at [phone] between the hours of 8:30 a.m. and 4:30 p.m.  Voicemails left after 4:30 p.m. will not be returned until the following business day.  For prescription refill requests, have your pharmacy contact our office.       Resources For Cancer Patients and their Caregivers ? American Cancer Society: Can assist with transportation, wigs, general needs, runs Look Good Feel Better.        [phone] ? Cancer Care: Provides financial assistance, online support groups, medication/co-pay assistance.  1-800-813-HOPE 2507758324) ? [facility] Assists [place] cancer patients and their families through emotional , educational and financial support.  (469)607-2015 ? [place] DSS Where to apply for food stamps, Medicaid and utility assistance. 858 764 8642 ? RCATS: Transportation to medical appointments. 916-401-1953 ? Social Security Administration: May apply for disability if have a Stage IV cancer. 712 330 1266 435 432 2982 ? [employer], Disability and Transit Services: Assists with nutrition, care and transit needs. [place] Support Programs: @10RELATIVEDAYS @ > Cancer Support Group  2nd Tuesday of the month 1pm-2pm, Journey Room  > Creative Journey  3rd Tuesday of the month 1130am-1pm, Journey Room  > Look Good Feel Better  1st Wednesday of the month 10am-12 noon, Journey Room (Call [place] to register [phone])

## 2017-05-29 NOTE — Progress Notes (Signed)
Melody Schwalbe, MD [address] [place] [address]  Iron deficiency anemia due to chronic blood loss  CURRENT THERAPY: IV iron when indicated.  INTERVAL HISTORY: Melody Mitchell 76 y.o. female returns for followup of iron deficiency anemia requiring IV iron with failure of p.o. iron maintaining iron stores secondary to AVMs.  HPI Elements   Location: Blood  Quality: Iron deficiency  Severity: Mild  Duration: Years  Context: Chronic GI blood loss from AVMs  Timing:   Modifying Factors:   Associated Signs & Symptoms:    She was admitted to the hospital on 01/02/2017 and discharged on 01/05/2017 for colitis.  She was treated with IV antibiotics.  Stools were tested for C. difficile and negative.  She was treated with ciprofloxacin and Flagyl.  She was discharged in stable condition.  She has been following up with GI since discharge from the hospital.  Documentation from June 2018 from Walden Field, NP (GI) are reviewed in detail.  She reports an appetite of 75%.  Her weight is stable. She rates her energy level is 75%. She reports ongoing knee and lower back pain and she rates as a 7/10.  Review of Systems  Constitutional: Positive for malaise/fatigue. Negative for chills, fever and weight loss.  HENT: Negative.   Eyes: Negative.   Respiratory: Negative.  Negative for cough.   Cardiovascular: Negative.  Negative for chest pain.  Gastrointestinal: Negative.  Negative for blood in stool, constipation, diarrhea, melena, nausea and vomiting.  Genitourinary: Negative.   Musculoskeletal: Negative.   Skin: Negative.   Neurological: Negative.  Negative for weakness.  Endo/Heme/Allergies: Negative.   Psychiatric/Behavioral: Negative.     Past Medical History:  Diagnosis Date  . Acid reflux   . Arthritis   . 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 Bronchitis january 2014  . Hiatal hernia   . High cholesterol   . History of contact  dermatitis   . Hx: recurrent pneumonia   . Hypertension   . IDA (iron deficiency anemia)    chronic iron infusions  . Reflux   . Vertigo     Past Surgical History:  Procedure Laterality Date  . ABDOMINAL HYSTERECTOMY    . APPENDECTOMY    . BILATERAL BREAST BIOPSIES  BENIGN    . CHOLECYSTECTOMY    . COLONOSCOPY N/A 06/29/2015   SLF: The left colon is redundant 2. four colon polyps removed. No source  for change in bowel habits identified 3. Rectal bleeding due to large internal hemorrhoids   . COLONOSCOPY W/ BIOPSIES  6 2010   Dr. Oneida Alar: Polypoid appearing lesion of the ascending colon, 3 mm sessile rectal polyp, small internal hemorrhoids. Pathology revealed polypoid mucosa, no adenomatous changes  . Double-balloon enteroscopy, antegrade  April 2011   Dr. 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  Marland Kitchen ESOPHAGOGASTRODUODENOSCOPY  04/2009   Dr. Oneida Alar: Patent Schatzki ring dilated with advancing the scope, patchy erythema in the antrum, 2 small AVMs in the duodenal bulb, 2 additional AVMs noted in the second portion the duodenum, mild gastritis on pathology  . ESOPHAGOGASTRODUODENOSCOPY N/A 06/29/2015   SLF: 1. 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 small hiatal hernia 3. Mild non-erosive gastririts- NO obvious source for dyspepsia identified.   Marland Kitchen EYE SURGERY      Family History  Problem Relation Age of Onset  . Heart failure Mother   . Diabetes Sister   .  Heart attack Father   . Colon cancer Neg Hx     Social History   Social History  . Marital status: Divorced    Spouse name: N/A  . Number of children: 1  . Years of education: N/A   Social History Main Topics  . Smoking status: Former Smoker    Packs/day: 1.00    Years: 25.00    Types: Cigarettes    Quit date: 09/14/2007  . Smokeless tobacco: Never Used  . Alcohol use Yes     Comment: occasional/rare (Brandy every now and then)  . Drug use: No  . Sexual activity: Yes    Birth  control/ protection: Post-menopausal, Surgical   Other Topics Concern  . None   Social History Narrative  . None     PHYSICAL EXAMINATION  ECOG PERFORMANCE STATUS: 1 - Symptomatic but completely ambulatory  Vitals:   05/29/17 1009  BP: 124/77  Pulse: 67  Resp: 16  Temp: 98.7 F (37.1 C)    GENERAL:alert, no distress, well nourished, well developed, comfortable, cooperative, obese, smiling and accompanied by sister SKIN: skin color, texture, turgor are normal, no rashes or significant lesions HEAD: Normocephalic, No masses, lesions, tenderness or abnormalities EYES: normal, EOMI, Conjunctiva are pink and non-injected EARS: External ears normal OROPHARYNX:lips, buccal mucosa, and tongue normal and mucous membranes are moist  NECK: supple, no adenopathy, trachea midline LYMPH:  no palpable lymphadenopathy BREAST:not examined LUNGS: clear to auscultation  HEART: regular rate & rhythm ABDOMEN:abdomen soft and normal bowel sounds BACK: Back symmetric, no curvature. EXTREMITIES:less then 2 second capillary refill, no joint deformities, effusion, or inflammation, no skin discoloration, no cyanosis  NEURO: alert & oriented x 3 with fluent speech, no focal motor/sensory deficits, gait normal   LABORATORY DATA: CBC    Component Value Date/Time   WBC 5.1 05/29/2017 0855   RBC 3.40 (L) 05/29/2017 0855   HGB 10.0 (L) 05/29/2017 0855   HCT 31.0 (L) 05/29/2017 0855   PLT 294 05/29/2017 0855   MCV 91.2 05/29/2017 0855   MCH 29.4 05/29/2017 0855   MCHC 32.3 05/29/2017 0855   RDW 13.9 05/29/2017 0855   LYMPHSABS 1.3 05/29/2017 0855   MONOABS 0.4 05/29/2017 0855   EOSABS 0.2 05/29/2017 0855   BASOSABS 0.0 05/29/2017 0855      Chemistry      Component Value Date/Time   NA 141 05/29/2017 0855   K 3.9 05/29/2017 0855   CL 111 05/29/2017 0855   CO2 24 05/29/2017 0855   BUN 9 05/29/2017 0855   CREATININE 0.94 05/29/2017 0855      Component Value Date/Time   CALCIUM 9.4  05/29/2017 0855   ALKPHOS 76 05/29/2017 0855   AST 28 05/29/2017 0855   ALT 14 05/29/2017 0855   BILITOT 0.5 05/29/2017 0855     , Lab Results  Component Value Date   IRON 34 02/27/2017   TIBC 277 02/27/2017   FERRITIN 41 02/27/2017     PENDING LABS:   RADIOGRAPHIC STUDIES:  No results found.   PATHOLOGY:    ASSESSMENT AND PLAN:  Iron deficiency anemia due to chronic blood loss Iron deficiency anemia requiring IV iron replacement with p.o. failure of iron maintaining iron stores secondary to AVMs.  Additionally, she is on PPI therapy for GERD which may also be contributing resulting in malabsorption.  EGD/colonoscopy by Dr. Oneida Alar on 06/29/2015 without any evidence of dysplasia or malignancy, but noted chronic inactive gastritis, for colon polyps negative for malignancy, and large  internal bleeding hemorrhoids.  She will be due for another EGD/colonoscopy in 2026 depending on risk:benefit ratio. AND Intermittent leukopenia with normal differential, likely benign leukopenia if necessary.  Labs today: CBC diff, CMET, iron/TIBC, ferritin.  I personally reviewed and went over laboratory results with the patient.  The results are noted within this dictation.  Worsening anemia is noted, possibly from iron deficiency.  Will perform labs more frequently.  Labs in 3 and 6 months: CBC diff, CMET, iron/TIBC, ferritin.  I personally reviewed and went over radiographic studies with the patient.  The results are noted within this dictation.  I personally reviewed the images in PACS.  Mammogram on 03/01/2017 was negative.  She will be due for her next screening mammogram in April 2019.  Return in 6 months for follow-up.  ORDERS PLACED FOR THIS ENCOUNTER: No orders of the defined types were placed in this encounter.   MEDICATIONS PRESCRIBED THIS ENCOUNTER: No orders of the defined types were placed in this encounter.   THERAPY PLAN:  Continue monitoring iron studies and provide IV  iron replacement when indicated.  All questions were answered. The patient knows to call the clinic with any problems, questions or concerns. We can certainly see the patient much sooner if necessary.  Patient and plan discussed with Dr. Twana First and she is in agreement with the aforementioned.   This note is electronically signed by: Doy Mince 05/29/2017 11:25 AM

## 2017-05-29 NOTE — Assessment & Plan Note (Addendum)
Iron deficiency anemia requiring IV iron replacement with p.o. failure of iron maintaining iron stores secondary to AVMs.  Additionally, she is on PPI therapy for GERD which may also be contributing resulting in malabsorption.  EGD/colonoscopy by Dr. Oneida Alar on 06/29/2015 without any evidence of dysplasia or malignancy, but noted chronic inactive gastritis, for colon polyps negative for malignancy, and large internal bleeding hemorrhoids.  She will be due for another EGD/colonoscopy in 2026 depending on risk:benefit ratio. AND Intermittent leukopenia with normal differential, likely benign leukopenia if necessary.  Labs today: CBC diff, CMET, iron/TIBC, ferritin.  I personally reviewed and went over laboratory results with the patient.  The results are noted within this dictation.  Worsening anemia is noted, possibly from iron deficiency.  Will perform labs more frequently.  Labs in 3 and 6 months: CBC diff, CMET, iron/TIBC, ferritin.  I personally reviewed and went over radiographic studies with the patient.  The results are noted within this dictation.  I personally reviewed the images in PACS.  Mammogram on 03/01/2017 was negative.  She will be due for her next screening mammogram in April 2019.  Return in 6 months for follow-up.

## 2017-06-01 ENCOUNTER — Encounter (HOSPITAL_BASED_OUTPATIENT_CLINIC_OR_DEPARTMENT_OTHER): Payer: Medicare Other

## 2017-06-01 VITALS — BP 123/69 | HR 70 | Temp 98.3°F | Resp 18

## 2017-06-01 DIAGNOSIS — D500 Iron deficiency anemia secondary to blood loss (chronic): Secondary | ICD-10-CM

## 2017-06-01 MED ORDER — SODIUM CHLORIDE 0.9 % IV SOLN
INTRAVENOUS | Status: DC
  Administered 2017-06-01: 10:00:00 via INTRAVENOUS

## 2017-06-01 MED ORDER — SODIUM CHLORIDE 0.9 % IV SOLN
750.0000 mg | Freq: Once | INTRAVENOUS | Status: AC
  Administered 2017-06-01: 750 mg via INTRAVENOUS
  Filled 2017-06-01: qty 15

## 2017-06-01 NOTE — Progress Notes (Signed)
Tolerated infusion w/o adverse reaction.  Alert, in no distress.  VSS.  Discharged ambulatory.  

## 2017-08-29 ENCOUNTER — Other Ambulatory Visit (HOSPITAL_COMMUNITY): Payer: Self-pay | Admitting: *Deleted

## 2017-08-29 DIAGNOSIS — D500 Iron deficiency anemia secondary to blood loss (chronic): Secondary | ICD-10-CM

## 2017-08-30 ENCOUNTER — Encounter (HOSPITAL_COMMUNITY): Payer: Medicare Other | Attending: Oncology

## 2017-08-30 ENCOUNTER — Other Ambulatory Visit (HOSPITAL_COMMUNITY): Payer: Self-pay | Admitting: Adult Health

## 2017-08-30 DIAGNOSIS — D500 Iron deficiency anemia secondary to blood loss (chronic): Secondary | ICD-10-CM | POA: Diagnosis not present

## 2017-08-30 LAB — CBC WITH DIFFERENTIAL/PLATELET
BASOS PCT: 0 %
Basophils Absolute: 0 10*3/uL (ref 0.0–0.1)
EOS ABS: 0.1 10*3/uL (ref 0.0–0.7)
EOS PCT: 2 %
HEMATOCRIT: 33.8 % — AB (ref 36.0–46.0)
Hemoglobin: 10.4 g/dL — ABNORMAL LOW (ref 12.0–15.0)
Lymphocytes Relative: 29 %
Lymphs Abs: 1.1 10*3/uL (ref 0.7–4.0)
MCH: 28 pg (ref 26.0–34.0)
MCHC: 30.8 g/dL (ref 30.0–36.0)
MCV: 91.1 fL (ref 78.0–100.0)
MONO ABS: 0.4 10*3/uL (ref 0.1–1.0)
MONOS PCT: 11 %
Neutro Abs: 2.2 10*3/uL (ref 1.7–7.7)
Neutrophils Relative %: 58 %
Platelets: 294 10*3/uL (ref 150–400)
RBC: 3.71 MIL/uL — ABNORMAL LOW (ref 3.87–5.11)
RDW: 14.6 % (ref 11.5–15.5)
WBC: 3.8 10*3/uL — ABNORMAL LOW (ref 4.0–10.5)

## 2017-08-30 LAB — COMPREHENSIVE METABOLIC PANEL
ALBUMIN: 3.7 g/dL (ref 3.5–5.0)
ALK PHOS: 75 U/L (ref 38–126)
ALT: 14 U/L (ref 14–54)
ANION GAP: 6 (ref 5–15)
AST: 24 U/L (ref 15–41)
BILIRUBIN TOTAL: 0.3 mg/dL (ref 0.3–1.2)
BUN: 10 mg/dL (ref 6–20)
CALCIUM: 9.2 mg/dL (ref 8.9–10.3)
CO2: 27 mmol/L (ref 22–32)
Chloride: 106 mmol/L (ref 101–111)
Creatinine, Ser: 0.99 mg/dL (ref 0.44–1.00)
GFR calc Af Amer: >60 mL/min (ref >60)
GFR, EST NON AFRICAN AMERICAN: 53 mL/min — AB (ref >60)
Glucose, Bld: 107 mg/dL — ABNORMAL HIGH (ref 65–99)
Potassium: 4.1 mmol/L (ref 3.5–5.1)
SODIUM: 139 mmol/L (ref 135–145)
TOTAL PROTEIN: 7.2 g/dL (ref 6.5–8.1)

## 2017-08-30 LAB — IRON AND TIBC
Iron: 35 ug/dL (ref 28–170)
SATURATION RATIOS: 12 % (ref 10.4–31.8)
TIBC: 291 ug/dL (ref 250–450)
UIBC: 256 ug/dL

## 2017-08-30 LAB — FERRITIN: Ferritin: 20 ng/mL (ref 11–307)

## 2017-09-07 ENCOUNTER — Encounter (HOSPITAL_COMMUNITY): Payer: Self-pay

## 2017-09-07 ENCOUNTER — Encounter (HOSPITAL_BASED_OUTPATIENT_CLINIC_OR_DEPARTMENT_OTHER): Payer: Medicare Other

## 2017-09-07 VITALS — BP 140/91 | HR 75 | Temp 98.4°F | Resp 18

## 2017-09-07 DIAGNOSIS — D500 Iron deficiency anemia secondary to blood loss (chronic): Secondary | ICD-10-CM | POA: Diagnosis not present

## 2017-09-07 MED ORDER — SODIUM CHLORIDE 0.9 % IV SOLN
510.0000 mg | Freq: Once | INTRAVENOUS | Status: AC
  Administered 2017-09-07: 510 mg via INTRAVENOUS
  Filled 2017-09-07: qty 17

## 2017-09-07 MED ORDER — SODIUM CHLORIDE 0.9 % IV SOLN
Freq: Once | INTRAVENOUS | Status: AC
  Administered 2017-09-07: 12:00:00 via INTRAVENOUS

## 2017-09-07 NOTE — Progress Notes (Signed)
Tolerated infusion w/o adverse reaction.  Alert, in no distress.  VSS.  Discharged ambulatory in c/o family.  

## 2017-10-03 ENCOUNTER — Encounter (INDEPENDENT_AMBULATORY_CARE_PROVIDER_SITE_OTHER): Payer: Medicare Other | Admitting: Ophthalmology

## 2017-10-03 DIAGNOSIS — H43813 Vitreous degeneration, bilateral: Secondary | ICD-10-CM | POA: Diagnosis not present

## 2017-10-03 DIAGNOSIS — H34832 Tributary (branch) retinal vein occlusion, left eye, with macular edema: Secondary | ICD-10-CM | POA: Diagnosis not present

## 2017-10-30 ENCOUNTER — Ambulatory Visit: Payer: Medicare Other | Admitting: Nurse Practitioner

## 2017-11-02 ENCOUNTER — Encounter (INDEPENDENT_AMBULATORY_CARE_PROVIDER_SITE_OTHER): Payer: Medicare Other | Admitting: Ophthalmology

## 2017-11-02 DIAGNOSIS — H43813 Vitreous degeneration, bilateral: Secondary | ICD-10-CM

## 2017-11-02 DIAGNOSIS — H34832 Tributary (branch) retinal vein occlusion, left eye, with macular edema: Secondary | ICD-10-CM | POA: Diagnosis not present

## 2017-11-14 ENCOUNTER — Encounter (HOSPITAL_COMMUNITY): Payer: Self-pay | Admitting: *Deleted

## 2017-11-14 ENCOUNTER — Emergency Department (HOSPITAL_COMMUNITY)
Admission: EM | Admit: 2017-11-14 | Discharge: 2017-11-14 | Disposition: A | Discharge status: Home / Self Care | Payer: Medicare Other | Attending: Emergency Medicine | Admitting: Emergency Medicine

## 2017-11-14 ENCOUNTER — Other Ambulatory Visit: Payer: Self-pay

## 2017-11-14 ENCOUNTER — Emergency Department (HOSPITAL_COMMUNITY): Payer: Medicare Other

## 2017-11-14 DIAGNOSIS — R1030 Lower abdominal pain, unspecified: Secondary | ICD-10-CM | POA: Diagnosis not present

## 2017-11-14 DIAGNOSIS — Z7982 Long term (current) use of aspirin: Secondary | ICD-10-CM | POA: Diagnosis not present

## 2017-11-14 DIAGNOSIS — Z79899 Other long term (current) drug therapy: Secondary | ICD-10-CM | POA: Diagnosis not present

## 2017-11-14 DIAGNOSIS — R1084 Generalized abdominal pain: Secondary | ICD-10-CM | POA: Diagnosis present

## 2017-11-14 DIAGNOSIS — Z91040 Latex allergy status: Secondary | ICD-10-CM | POA: Diagnosis not present

## 2017-11-14 DIAGNOSIS — Z87891 Personal history of nicotine dependence: Secondary | ICD-10-CM | POA: Diagnosis not present

## 2017-11-14 DIAGNOSIS — I10 Essential (primary) hypertension: Secondary | ICD-10-CM | POA: Diagnosis not present

## 2017-11-14 LAB — COMPREHENSIVE METABOLIC PANEL
ALBUMIN: 3.5 g/dL (ref 3.5–5.0)
ALT: 59 U/L — AB (ref 14–54)
AST: 64 U/L — AB (ref 15–41)
Alkaline Phosphatase: 93 U/L (ref 38–126)
Anion gap: 12 (ref 5–15)
BUN: 10 mg/dL (ref 6–20)
CO2: 23 mmol/L (ref 22–32)
CREATININE: 0.92 mg/dL (ref 0.44–1.00)
Calcium: 9.1 mg/dL (ref 8.9–10.3)
Chloride: 105 mmol/L (ref 101–111)
GFR calc Af Amer: >60 mL/min (ref >60)
GFR, EST NON AFRICAN AMERICAN: 58 mL/min — AB (ref >60)
GLUCOSE: 127 mg/dL — AB (ref 65–99)
Potassium: 3.6 mmol/L (ref 3.5–5.1)
Sodium: 140 mmol/L (ref 135–145)
Total Bilirubin: 0.3 mg/dL (ref 0.3–1.2)
Total Protein: 7.1 g/dL (ref 6.5–8.1)

## 2017-11-14 LAB — DIFFERENTIAL
BASOS PCT: 0 %
Basophils Absolute: 0 10*3/uL (ref 0.0–0.1)
EOS ABS: 0.1 10*3/uL (ref 0.0–0.7)
EOS PCT: 2 %
Lymphocytes Relative: 17 %
Lymphs Abs: 1.1 10*3/uL (ref 0.7–4.0)
MONO ABS: 0.5 10*3/uL (ref 0.1–1.0)
Monocytes Relative: 7 %
NEUTROS ABS: 5 10*3/uL (ref 1.7–7.7)
Neutrophils Relative %: 74 %

## 2017-11-14 LAB — CBC
HCT: 33.4 % — ABNORMAL LOW (ref 36.0–46.0)
Hemoglobin: 10.5 g/dL — ABNORMAL LOW (ref 12.0–15.0)
MCH: 29.2 pg (ref 26.0–34.0)
MCHC: 31.4 g/dL (ref 30.0–36.0)
MCV: 92.8 fL (ref 78.0–100.0)
PLATELETS: 264 10*3/uL (ref 150–400)
RBC: 3.6 MIL/uL — ABNORMAL LOW (ref 3.87–5.11)
RDW: 16 % — AB (ref 11.5–15.5)
WBC: 6.8 10*3/uL (ref 4.0–10.5)

## 2017-11-14 LAB — LIPASE, BLOOD: LIPASE: 59 U/L — AB (ref 11–51)

## 2017-11-14 MED ORDER — IOPAMIDOL (ISOVUE-300) INJECTION 61%
100.0000 mL | Freq: Once | INTRAVENOUS | Status: AC | PRN
  Administered 2017-11-14: 100 mL via INTRAVENOUS

## 2017-11-14 MED ORDER — ONDANSETRON HCL 4 MG/2ML IJ SOLN
4.0000 mg | Freq: Once | INTRAMUSCULAR | Status: AC
  Administered 2017-11-14: 4 mg via INTRAVENOUS
  Filled 2017-11-14: qty 2

## 2017-11-14 MED ORDER — DICYCLOMINE HCL 20 MG PO TABS: ORAL_TABLET | ORAL | 0 refills | Status: DC

## 2017-11-14 MED ORDER — SODIUM CHLORIDE 0.9 % IV BOLUS (SEPSIS)
500.0000 mL | Freq: Once | INTRAVENOUS | Status: AC
  Administered 2017-11-14: 500 mL via INTRAVENOUS

## 2017-11-14 NOTE — Discharge Instructions (Signed)
Drink plenty of fluids and follow-up with your family doctor later this week for recheck

## 2017-11-14 NOTE — ED Notes (Signed)
Patient transported to CT 

## 2017-11-14 NOTE — ED Provider Notes (Signed)
[facility] EMERGENCY DEPARTMENT Provider Note   CSN: [id number] Arrival date & time: 11/14/17  1846     History   Chief Complaint Chief Complaint  Patient presents with  . Abdominal Pain    HPI Melody Mitchell is a 77 y.o. female.  Patient complains of abdominal bloating.  Mild nausea.   The history is provided by the patient.  Abdominal Pain   This is a new problem. The current episode started more than 2 days ago. The problem occurs constantly. The problem has not changed since onset.The pain is associated with an unknown factor. The pain is located in the generalized abdominal region. The quality of the pain is aching. The pain is at a severity of 3/10. The pain is moderate. Pertinent negatives include diarrhea, flatus, frequency, hematuria and headaches.    Past Medical History:  Diagnosis Date  . Acid reflux   . Arthritis   . 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 Bronchitis january 2014  . Hiatal hernia   . High cholesterol   . History of contact dermatitis   . Hx: recurrent pneumonia   . Hypertension   . IDA (iron deficiency anemia)    chronic iron infusions  . Reflux   . Vertigo     Patient Active Problem List   Diagnosis Date Noted  . Abnormal CT of the abdomen 02/02/2017  . Rectal bleeding 02/02/2017  . Aortic atherosclerosis ([place]) 01/03/2017  . Colitis 01/02/2017  . Hypokalemia 01/02/2017  . Heme positive stool 06/10/2015  . Change in bowel habits 06/10/2015  . Abdominal pain, epigastric 06/10/2015  . High cholesterol   . Acid reflux   . GERD 01/03/2011  . HEMORRHOIDS 04/15/2010  . Iron deficiency anemia due to chronic blood loss 01/12/2010  . 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 ABDOMINAL HYSTERECTOMY    . APPENDECTOMY    . BILATERAL BREAST BIOPSIES  BENIGN    . CHOLECYSTECTOMY    . COLONOSCOPY N/A 06/29/2015   SLF: The left  colon is redundant 2. four colon polyps removed. No source  for change in bowel habits identified 3. Rectal bleeding due to large internal hemorrhoids   . COLONOSCOPY W/ BIOPSIES  6 2010   Dr. Oneida Alar: Polypoid appearing lesion of the ascending colon, 3 mm sessile rectal polyp, small internal hemorrhoids. Pathology revealed polypoid mucosa, no adenomatous changes  . Double-balloon enteroscopy, antegrade  April 2011   Dr. 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  Marland Kitchen ESOPHAGOGASTRODUODENOSCOPY  04/2009   Dr. Oneida Alar: Patent Schatzki ring dilated with advancing the scope, patchy erythema in the antrum, 2 small AVMs in the duodenal bulb, 2 additional AVMs noted in the second portion the duodenum, mild gastritis on pathology  . ESOPHAGOGASTRODUODENOSCOPY N/A 06/29/2015   SLF: 1. 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 small hiatal hernia 3. Mild non-erosive gastririts- NO obvious source for dyspepsia identified.   Marland Kitchen EYE SURGERY      OB History    No data available       Home Medications    Prior to Admission medications   Medication Sig Start Date End Date Taking? Authorizing Provider  amitriptyline (ELAVIL) 25 MG tablet Take 25 mg by mouth at bedtime as needed for sleep.     [provider]  aspirin 81 MG chewable tablet Chew 81 mg by mouth daily.  [provider]  Cetirizine HCl 10 MG CAPS Take 1 capsule by mouth daily.    [provider]  dicyclomine (BENTYL) 20 MG tablet Take 1 every 6-8 hours for abdominal cramping 11/14/17   Milton Ferguson, MD  DOCOSAHEXAENOIC ACID PO Take 1 capsule by mouth daily.    [provider]  docusate sodium (STOOL SOFTENER) 100 MG capsule Take 100 mg by mouth 2 (two) times daily.    [provider]  escitalopram (LEXAPRO) 10 MG tablet Take 10 mg by mouth daily as needed (depression).  10/25/13   [provider]  esomeprazole (NEXIUM) 40 MG capsule Take 40 mg by mouth daily  before breakfast.      [provider]  fish oil-omega-3 fatty acids 1000 MG capsule Take 1 g by mouth daily.     [provider]  fluticasone (FLONASE) 50 MCG/ACT nasal spray Place 2 sprays into both nostrils daily.  04/11/13   [provider]  meclizine (ANTIVERT) 25 MG tablet Take 1 tablet (25 mg total) by mouth 3 (three) times daily as needed for dizziness. 09/13/16   Rolland Porter, MD  methylcellulose (ARTIFICIAL TEARS) 1 % ophthalmic solution Place 1 drop into both eyes 2 (two) times daily as needed (dry eyes).    [provider]  montelukast (SINGULAIR) 10 MG tablet Take 10 mg by mouth as needed.     [provider]  Multiple Vitamin (MULTIVITAMIN) capsule Take 1 capsule by mouth daily.      [provider]  potassium chloride SA (K-DUR,KLOR-CON) 20 MEQ tablet Take 1 tablet (20 mEq total) by mouth 2 (two) times daily. 02/27/17   Baird Cancer, PA-C  predniSONE (DELTASONE) 20 MG tablet Take 1 tablet by mouth 2 (two) times daily. 10/30/17   [provider]  traMADol (ULTRAM) 50 MG tablet Take 50 mg by mouth daily as needed for moderate pain.  09/24/13   [provider]  triamcinolone cream (KENALOG) 0.1 % Apply 1 application topically daily as needed (rash).  11/24/16   [provider]  ZETIA 10 MG tablet Take 10 mg by mouth every morning. 11/30/15   [provider]    Family History Family History  Problem Relation Age of Onset  . Heart failure Mother   . Diabetes Sister   . Heart attack Father   . Colon cancer Neg Hx     Social History Social History   Tobacco Use  . Smoking status: Former Smoker    Packs/day: 1.00    Years: 25.00    Pack years: 25.00    Types: Cigarettes    Last attempt to quit: 09/14/2007    Years since quitting: 10.1  . Smokeless tobacco: Never Used  Substance Use Topics  . Alcohol use: Yes    Comment: occasional/rare (Brandy every now and then)  . Drug use: No      Allergies   Latex; Sulfa antibiotics; and Sulfonamide derivatives   Review of Systems Review of Systems  Constitutional: Negative for appetite change and fatigue.  HENT: Negative for congestion, ear discharge and sinus pressure.   Eyes: Negative for discharge.  Respiratory: Negative for cough.   Cardiovascular: Negative for chest pain.  Gastrointestinal: Positive for abdominal pain. Negative for diarrhea and flatus.  Genitourinary: Negative for frequency and hematuria.  Musculoskeletal: Negative for back pain.  Skin: Negative for rash.  Neurological: Negative for seizures and headaches.  Psychiatric/Behavioral: Negative for hallucinations.     Physical Exam Updated Vital  Signs BP 125/90 (BP Location: Right Arm)   Pulse 73   Temp 98.9 F (37.2 C) (Oral)   Resp 18   Ht 5\' 4"  (1.626 m)   Wt 63.5 kg (140 lb)   SpO2 99%   BMI 24.03 kg/m   Physical Exam  Constitutional: She is oriented to person, place, and time. She appears well-developed.  HENT:  Head: Normocephalic.  Eyes: Conjunctivae and EOM are normal. No scleral icterus.  Neck: Neck supple. No thyromegaly present.  Cardiovascular: Normal rate and regular rhythm. Exam reveals no gallop and no friction rub.  No murmur heard. Pulmonary/Chest: No stridor. She has no wheezes. She has no rales. She exhibits no tenderness.  Abdominal: She exhibits distension. There is tenderness. There is no rebound.  Mild distention and tenderness throughout abdomen  Musculoskeletal: Normal range of motion. She exhibits no edema.  Lymphadenopathy:    She has no cervical adenopathy.  Neurological: She is oriented to person, place, and time. She exhibits normal muscle tone. Coordination normal.  Skin: No rash noted. No erythema.  Psychiatric: She has a normal mood and affect. Her behavior is normal.     ED Treatments / Results  Labs (all labs ordered are listed, but only abnormal results are displayed) Labs Reviewed  LIPASE,  BLOOD - Abnormal; Notable for the following components:      Result Value   Lipase 59 (*)    All other components within normal limits  COMPREHENSIVE METABOLIC PANEL - Abnormal; Notable for the following components:   Glucose, Bld 127 (*)    AST 64 (*)    ALT 59 (*)    GFR calc non Af Amer 58 (*)    All other components within normal limits  CBC - Abnormal; Notable for the following components:   RBC 3.60 (*)    Hemoglobin 10.5 (*)    HCT 33.4 (*)    RDW 16.0 (*)    All other components within normal limits  DIFFERENTIAL    EKG  EKG Interpretation None       Radiology Ct Abdomen Pelvis W Contrast  Result Date: 11/14/2017 CLINICAL DATA:  Generalized abdominal pain starting today. EXAM: CT ABDOMEN AND PELVIS WITH CONTRAST TECHNIQUE: Multidetector CT imaging of the abdomen and pelvis was performed using the standard protocol following bolus administration of intravenous contrast. CONTRAST:  144mL ISOVUE-300 IOPAMIDOL (ISOVUE-300) INJECTION 61% COMPARISON:  01/02/2017 FINDINGS: Lower chest: Stable cardiomegaly without pericardial effusion. Ectatic thoracic aorta. Bibasilar atelectasis. Small left-sided fat containing Bochdalek type hernia. Hepatobiliary: Cholecystectomy. Mild reservoir effect status post cholecystectomy likely accounting for mild intrahepatic biliary dilatation. No enhancing mass lesions of the liver. Tiny too small to characterize hypodensity in the right hepatic lobe statistically consistent with a cyst or hemangioma. Pancreas: No inflammation, ductal dilatation or mass. Spleen: Normal size with punctate tiny calcified granulomas. Adrenals/Urinary Tract: Normal bilateral adrenal glands and kidneys without obstructive uropathy. The urinary bladder is unremarkable. Stomach/Bowel: Physiologic distention of the stomach with normal small bowel rotation. No acute small bowel inflammation nor obstruction. Semi formed liquid stool within the right colon. A few mildly distended  fluid filled small bowel loops are seen within the distal and terminal ileum, nonspecific but possibly representing a mild enteritis. The appendix is surgically absent. Vascular/Lymphatic: Mild aortoiliac atherosclerosis.  No adenopathy. Reproductive:  Status post hysterectomy.  No adnexal mass Other: No abdominal wall hernia or abnormality. No abdominopelvic ascites. Musculoskeletal: Discogenic endplate sclerosis with marked degenerative disc space flattening at T11-12. No acute osseous  abnormality. IMPRESSION: 1. Mild fluid-filled distention of distal small bowel query a mild small bowel enteritis. No mechanical obstruction. 2. Stable cardiomegaly without pericardial effusion. 3. Small fat containing left-sided Bochdalek hernia. 4. Aortoiliac atherosclerosis. 5. 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 6. Status post cholecystectomy and hysterectomy. Electronically Signed   By: Ashley Royalty M.D.   On: 11/14/2017 21:42    Procedures Procedures (including critical care time)  Medications Ordered in ED Medications  ondansetron (ZOFRAN) injection 4 mg (4 mg Intravenous Given 11/14/17 2013)  sodium chloride 0.9 % bolus 500 mL (500 mLs Intravenous New Bag/Given 11/14/17 2013)  iopamidol (ISOVUE-300) 61 % injection 100 mL (100 mLs Intravenous Contrast Given 11/14/17 2117)     Initial Impression / Assessment and Plan / ED Course  I have reviewed the triage vital signs and the nursing notes.  Pertinent labs & imaging results that were available during my care of the patient were reviewed by me and considered in my medical decision making (see chart for details).    Labs were unremarkable.  Ct shows enteritis.  Pt will discharged with bentyl and follow up pcp   Final Clinical Impressions(s) / ED Diagnoses   Final diagnoses:  Lower abdominal pain    ED Discharge Orders        Ordered    dicyclomine (BENTYL) 20 MG tablet     11/14/17 2224       Milton Ferguson,  MD 11/14/17 2228

## 2017-11-14 NOTE — ED Triage Notes (Signed)
Pt c/o generalized abdominal pain that started today; pt denies any n/v/d; pt states her last BM was yesterday but it was not her norm

## 2017-11-28 ENCOUNTER — Other Ambulatory Visit (HOSPITAL_COMMUNITY): Payer: Self-pay | Admitting: *Deleted

## 2017-11-28 DIAGNOSIS — D500 Iron deficiency anemia secondary to blood loss (chronic): Secondary | ICD-10-CM

## 2017-11-29 ENCOUNTER — Inpatient Hospital Stay (HOSPITAL_COMMUNITY): Payer: Medicare Other | Attending: Hematology and Oncology | Admitting: Internal Medicine

## 2017-11-29 ENCOUNTER — Encounter (HOSPITAL_COMMUNITY): Payer: Self-pay | Admitting: Internal Medicine

## 2017-11-29 ENCOUNTER — Other Ambulatory Visit: Payer: Self-pay

## 2017-11-29 ENCOUNTER — Inpatient Hospital Stay (HOSPITAL_COMMUNITY): Payer: Medicare Other

## 2017-11-29 VITALS — BP 113/81 | HR 87 | Temp 98.4°F | Resp 18 | Wt 151.0 lb

## 2017-11-29 DIAGNOSIS — Z86010 Personal history of colonic polyps: Secondary | ICD-10-CM | POA: Diagnosis not present

## 2017-11-29 DIAGNOSIS — D509 Iron deficiency anemia, unspecified: Secondary | ICD-10-CM | POA: Insufficient documentation

## 2017-11-29 DIAGNOSIS — K5520 Angiodysplasia of colon without hemorrhage: Secondary | ICD-10-CM | POA: Insufficient documentation

## 2017-11-29 DIAGNOSIS — Z87891 Personal history of nicotine dependence: Secondary | ICD-10-CM | POA: Insufficient documentation

## 2017-11-29 DIAGNOSIS — R42 Dizziness and giddiness: Secondary | ICD-10-CM | POA: Insufficient documentation

## 2017-11-29 DIAGNOSIS — Z90710 Acquired absence of both cervix and uterus: Secondary | ICD-10-CM

## 2017-11-29 DIAGNOSIS — E7800 Pure hypercholesterolemia, unspecified: Secondary | ICD-10-CM

## 2017-11-29 DIAGNOSIS — Z8701 Personal history of pneumonia (recurrent): Secondary | ICD-10-CM | POA: Diagnosis not present

## 2017-11-29 DIAGNOSIS — Z9049 Acquired absence of other specified parts of digestive tract: Secondary | ICD-10-CM | POA: Insufficient documentation

## 2017-11-29 DIAGNOSIS — M129 Arthropathy, unspecified: Secondary | ICD-10-CM | POA: Insufficient documentation

## 2017-11-29 DIAGNOSIS — K449 Diaphragmatic hernia without obstruction or gangrene: Secondary | ICD-10-CM | POA: Insufficient documentation

## 2017-11-29 DIAGNOSIS — D500 Iron deficiency anemia secondary to blood loss (chronic): Secondary | ICD-10-CM

## 2017-11-29 DIAGNOSIS — I10 Essential (primary) hypertension: Secondary | ICD-10-CM | POA: Insufficient documentation

## 2017-11-29 DIAGNOSIS — K219 Gastro-esophageal reflux disease without esophagitis: Secondary | ICD-10-CM | POA: Diagnosis not present

## 2017-11-29 LAB — CBC WITH DIFFERENTIAL/PLATELET
Basophils Absolute: 0 10*3/uL (ref 0.0–0.1)
Basophils Relative: 0 %
EOS ABS: 0.1 10*3/uL (ref 0.0–0.7)
Eosinophils Relative: 3 %
HCT: 36.8 % (ref 36.0–46.0)
Hemoglobin: 11.6 g/dL — ABNORMAL LOW (ref 12.0–15.0)
LYMPHS ABS: 1.9 10*3/uL (ref 0.7–4.0)
LYMPHS PCT: 45 %
MCH: 29 pg (ref 26.0–34.0)
MCHC: 31.5 g/dL (ref 30.0–36.0)
MCV: 92 fL (ref 78.0–100.0)
Monocytes Absolute: 0.3 10*3/uL (ref 0.1–1.0)
Monocytes Relative: 6 %
Neutro Abs: 1.9 10*3/uL (ref 1.7–7.7)
Neutrophils Relative %: 46 %
PLATELETS: 339 10*3/uL (ref 150–400)
RBC: 4 MIL/uL (ref 3.87–5.11)
RDW: 15 % (ref 11.5–15.5)
WBC: 4.3 10*3/uL (ref 4.0–10.5)

## 2017-11-29 LAB — IRON AND TIBC
IRON: 36 ug/dL (ref 28–170)
SATURATION RATIOS: 12 % (ref 10.4–31.8)
TIBC: 294 ug/dL (ref 250–450)
UIBC: 258 ug/dL

## 2017-11-29 LAB — COMPREHENSIVE METABOLIC PANEL
ALT: 15 U/L (ref 14–54)
ANION GAP: 12 (ref 5–15)
AST: 22 U/L (ref 15–41)
Albumin: 3.8 g/dL (ref 3.5–5.0)
Alkaline Phosphatase: 84 U/L (ref 38–126)
BUN: 14 mg/dL (ref 6–20)
CHLORIDE: 104 mmol/L (ref 101–111)
CO2: 22 mmol/L (ref 22–32)
Calcium: 9.5 mg/dL (ref 8.9–10.3)
Creatinine, Ser: 0.87 mg/dL (ref 0.44–1.00)
Glucose, Bld: 82 mg/dL (ref 65–99)
POTASSIUM: 4 mmol/L (ref 3.5–5.1)
SODIUM: 138 mmol/L (ref 135–145)
Total Bilirubin: 0.4 mg/dL (ref 0.3–1.2)
Total Protein: 7.5 g/dL (ref 6.5–8.1)

## 2017-11-29 LAB — FERRITIN: FERRITIN: 44 ng/mL (ref 11–307)

## 2017-11-29 NOTE — Progress Notes (Signed)
Melody Schwalbe, MD [address] [place] [address]  CURRENT THERAPY: IV iron when indicated.  INTERVAL HISTORY: Melody Mitchell 77 y.o. female returns for followup of iron deficiency anemia requiring IV iron with failure of p.o. iron maintaining iron stores secondary to intestinal AVMs.  She is doing well with no fatigue, chest pain or Sob, she has not noticed any melena, hematochezia, no dyspeptic symptoms, no dysphagia, no weight loss, no anorexia.   Review of Systems  Constitutional: Negative for malaise/fatigue and weight loss.  HENT: Negative.   Eyes: Negative.   Respiratory: Negative.  Negative for cough.   Cardiovascular: Negative.  Negative for chest pain.  Gastrointestinal: Negative.  Negative for blood in stool, constipation, diarrhea, melena, nausea and vomiting.  Genitourinary: Negative.   Musculoskeletal: Negative.   Skin: Negative.   Neurological: Negative.  Negative for weakness.  Endo/Heme/Allergies: Negative.   Psychiatric/Behavioral: Negative.   All other systems reviewed and are negative.   Past Medical History:  Diagnosis Date  . Acid reflux   . Arthritis   . 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 Bronchitis january 2014  . Hiatal hernia   . High cholesterol   . History of contact dermatitis   . Hx: recurrent pneumonia   . Hypertension   . IDA (iron deficiency anemia)    chronic iron infusions  . Reflux   . Vertigo     Past Surgical History:  Procedure Laterality Date  . ABDOMINAL HYSTERECTOMY    . APPENDECTOMY    . BILATERAL BREAST BIOPSIES  BENIGN    . CHOLECYSTECTOMY    . COLONOSCOPY N/A 06/29/2015   SLF: The left colon is redundant 2. four colon polyps removed. No source  for change in bowel habits identified 3. Rectal bleeding due to large internal hemorrhoids   . COLONOSCOPY W/ BIOPSIES  6 2010   Dr. Oneida Alar: Polypoid appearing lesion of the ascending colon, 3 mm sessile rectal polyp, small  internal hemorrhoids. Pathology revealed polypoid mucosa, no adenomatous changes  . Double-balloon enteroscopy, antegrade  April 2011   Dr. 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  Marland Kitchen ESOPHAGOGASTRODUODENOSCOPY  04/2009   Dr. Oneida Alar: Patent Schatzki ring dilated with advancing the scope, patchy erythema in the antrum, 2 small AVMs in the duodenal bulb, 2 additional AVMs noted in the second portion the duodenum, mild gastritis on pathology  . ESOPHAGOGASTRODUODENOSCOPY N/A 06/29/2015   SLF: 1. 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 small hiatal hernia 3. Mild non-erosive gastririts- NO obvious source for dyspepsia identified.   Marland Kitchen EYE SURGERY      Family History  Problem Relation Age of Onset  . Heart failure Mother   . Diabetes Sister   . Heart attack Father   . Colon cancer Neg Hx     Social History   Socioeconomic History  . Marital status: Divorced    Spouse name: None  . Number of children: 1  . Years of education: None  . Highest education level: None  Social Needs  . Financial resource strain: None  . Food insecurity - worry: None  . Food insecurity - inability: None  . Transportation needs - medical: None  . Transportation needs - non-medical: None  Occupational History  . None  Tobacco Use  . Smoking status: Former Smoker    Packs/day: 1.00    Years: 25.00    Pack years: 25.00  Types: Cigarettes    Last attempt to quit: 09/14/2007    Years since quitting: 10.2  . Smokeless tobacco: Never Used  Substance and Sexual Activity  . Alcohol use: Yes    Comment: occasional/rare (Brandy every now and then)  . Drug use: No  . Sexual activity: Yes    Birth control/protection: Post-menopausal, Surgical  Other Topics Concern  . None  Social History Narrative  . None     PHYSICAL EXAMINATION  Physical Exam  Constitutional: She is oriented to person, place, and time. She appears well-developed and well-nourished.  HENT:  Head:  Normocephalic.  Neck: Normal range of motion.  Cardiovascular: Normal rate and regular rhythm.  Pulmonary/Chest: Effort normal.  Musculoskeletal: Normal range of motion.  Neurological: She is alert and oriented to person, place, and time.  Skin: No erythema.  Psychiatric: She has a normal mood and affect. Her behavior is normal. Judgment normal.  Nursing note and vitals reviewed.   ECOG PERFORMANCE STATUS: 1 - Symptomatic but completely ambulatory  Vitals:   11/29/17 1201  BP: 113/81  Pulse: 87  Resp: 18  Temp: 98.4 F (36.9 C)  SpO2: 100%   LABORATORY DATA: CBC    Component Value Date/Time   WBC 4.3 11/29/2017 1113   RBC 4.00 11/29/2017 1113   HGB 11.6 (L) 11/29/2017 1113   HCT 36.8 11/29/2017 1113   PLT 339 11/29/2017 1113   MCV 92.0 11/29/2017 1113   MCH 29.0 11/29/2017 1113   MCHC 31.5 11/29/2017 1113   RDW 15.0 11/29/2017 1113   LYMPHSABS 1.9 11/29/2017 1113   MONOABS 0.3 11/29/2017 1113   EOSABS 0.1 11/29/2017 1113   BASOSABS 0.0 11/29/2017 1113      Chemistry      Component Value Date/Time   NA 138 11/29/2017 1113   K 4.0 11/29/2017 1113   CL 104 11/29/2017 1113   CO2 22 11/29/2017 1113   BUN 14 11/29/2017 1113   CREATININE 0.87 11/29/2017 1113      Component Value Date/Time   CALCIUM 9.5 11/29/2017 1113   ALKPHOS 84 11/29/2017 1113   AST 22 11/29/2017 1113   ALT 15 11/29/2017 1113   BILITOT 0.4 11/29/2017 1113     , Lab Results  Component Value Date   IRON 35 08/30/2017   TIBC 291 08/30/2017   FERRITIN 20 08/30/2017    PATHOLOGY:    ASSESSMENT AND PLAN:  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 EGD in 2016 was negative Colonscopy 06/2015- showed internal hemorrhoids and benign polyps. Continue to follow with GI No active clinically significant bleeding reported by patient today No excess fatigue.  CBC shows normal hgb.  Iron panel, normal Ferritin today are  pending. No indication for parenteral Iron today. Since she cannot tolerate oral Iron. We will continue to monitor, return in 6 months with repeat CBC, Ferritin and Iron panel.  ORDERS PLACED FOR THIS ENCOUNTER: No orders of the defined types were placed in this encounter.   MEDICATIONS PRESCRIBED THIS ENCOUNTER: No orders of the defined types were placed in this encounter.   This note is electronically signed by: Creola Corn, MD 11/29/2017 3:05 PM

## 2017-11-29 NOTE — Patient Instructions (Signed)
[facility] at [facility] Discharge Instructions  RECOMMENDATIONS MADE BY THE CONSULTANT AND ANY TEST RESULTS WILL BE SENT TO YOUR REFERRING PHYSICIAN.  You were seen today by Dr. Peru See schedulers up front for appointments   Thank you for choosing [facility] at [facility] to provide your oncology and hematology care.  To afford each patient quality time with our provider, please arrive at least 15 minutes before your scheduled appointment time.    If you have a lab appointment with the Cancer Center please come in thru the  Main Entrance and check in at the main information desk  You need to re-schedule your appointment should you arrive 10 or more minutes late.  We strive to give you quality time with our providers, and arriving late affects you and other patients whose appointments are after yours.  Also, if you no show three or more times for appointments you may be dismissed from the clinic at the providers discretion.     Again, thank you for choosing [facility].  Our hope is that these requests will decrease the amount of time that you wait before being seen by our physicians.       _____________________________________________________________  Should you have questions after your visit to  Cancer Center, please contact our office at [phone] between the hours of 8:30 a.m. and 4:30 p.m.  Voicemails left after 4:30 p.m. will not be returned until the following business day.  For prescription refill requests, have your pharmacy contact our office.       Resources For Cancer Patients and their Caregivers ? American Cancer Society: Can assist with transportation, wigs, general needs, runs Look Good Feel Better.        1-888-227-6333 ? Cancer Care: Provides financial assistance, online support groups, medication/co-pay assistance.  1-800-813-HOPE (4673) ? [facility] Assists  [place] cancer patients and their families through emotional , educational and financial support.  336-427-4357 ? [place] DSS Where to apply for food stamps, Medicaid and utility assistance. 336-342-1394 ? [facility]: Transportation to medical appointments. 336-347-2287 ? Social Security Administration: May apply for disability if have a Stage IV cancer. 336-342-7796 1-800-772-1213 ? [place] Aging, Disability and Transit Services: Assists with nutrition, care and transit needs. 336-349-2343  Cancer Center Support Programs: @10RELATIVEDAYS@ > Cancer Support Group  2nd Tuesday of the month 1pm-2pm, Journey Room  > Creative Journey  3rd Tuesday of the month 1130am-1pm, Journey Room  > Look Good Feel Better  1st Wednesday of the month 10am-12 noon, Journey Room (Call American Cancer Society to register 1-800-395-5775)    

## 2017-11-30 ENCOUNTER — Encounter (INDEPENDENT_AMBULATORY_CARE_PROVIDER_SITE_OTHER): Payer: Medicare Other | Admitting: Ophthalmology

## 2017-11-30 DIAGNOSIS — H43813 Vitreous degeneration, bilateral: Secondary | ICD-10-CM

## 2017-11-30 DIAGNOSIS — H34832 Tributary (branch) retinal vein occlusion, left eye, with macular edema: Secondary | ICD-10-CM

## 2017-12-01 ENCOUNTER — Ambulatory Visit (HOSPITAL_COMMUNITY): Payer: Medicare Other | Admitting: Adult Health

## 2017-12-01 ENCOUNTER — Other Ambulatory Visit (HOSPITAL_COMMUNITY): Payer: Medicare Other

## 2017-12-27 ENCOUNTER — Encounter (INDEPENDENT_AMBULATORY_CARE_PROVIDER_SITE_OTHER): Payer: Medicare Other | Admitting: Ophthalmology

## 2017-12-27 DIAGNOSIS — H43813 Vitreous degeneration, bilateral: Secondary | ICD-10-CM | POA: Diagnosis not present

## 2017-12-27 DIAGNOSIS — H34832 Tributary (branch) retinal vein occlusion, left eye, with macular edema: Secondary | ICD-10-CM

## 2018-01-24 ENCOUNTER — Encounter (INDEPENDENT_AMBULATORY_CARE_PROVIDER_SITE_OTHER): Payer: Medicare Other | Admitting: Ophthalmology

## 2018-01-24 DIAGNOSIS — H34832 Tributary (branch) retinal vein occlusion, left eye, with macular edema: Secondary | ICD-10-CM | POA: Diagnosis not present

## 2018-01-24 DIAGNOSIS — H43813 Vitreous degeneration, bilateral: Secondary | ICD-10-CM

## 2018-02-21 ENCOUNTER — Encounter (INDEPENDENT_AMBULATORY_CARE_PROVIDER_SITE_OTHER): Payer: Medicare Other | Admitting: Ophthalmology

## 2018-02-21 DIAGNOSIS — H43813 Vitreous degeneration, bilateral: Secondary | ICD-10-CM | POA: Diagnosis not present

## 2018-02-21 DIAGNOSIS — H34832 Tributary (branch) retinal vein occlusion, left eye, with macular edema: Secondary | ICD-10-CM | POA: Diagnosis not present

## 2018-03-21 ENCOUNTER — Encounter (INDEPENDENT_AMBULATORY_CARE_PROVIDER_SITE_OTHER): Payer: Medicare Other | Admitting: Ophthalmology

## 2018-03-21 DIAGNOSIS — H43813 Vitreous degeneration, bilateral: Secondary | ICD-10-CM

## 2018-03-21 DIAGNOSIS — H34832 Tributary (branch) retinal vein occlusion, left eye, with macular edema: Secondary | ICD-10-CM | POA: Diagnosis not present

## 2018-03-30 ENCOUNTER — Encounter (INDEPENDENT_AMBULATORY_CARE_PROVIDER_SITE_OTHER): Payer: Medicare Other | Admitting: Ophthalmology

## 2018-04-23 ENCOUNTER — Encounter (HOSPITAL_COMMUNITY): Payer: Self-pay | Admitting: Emergency Medicine

## 2018-04-23 ENCOUNTER — Other Ambulatory Visit: Payer: Self-pay

## 2018-04-23 ENCOUNTER — Emergency Department (HOSPITAL_COMMUNITY)
Admission: EM | Admit: 2018-04-23 | Discharge: 2018-04-23 | Disposition: A | Discharge status: Home / Self Care | Payer: Medicare Other | Attending: Emergency Medicine | Admitting: Emergency Medicine

## 2018-04-23 DIAGNOSIS — I10 Essential (primary) hypertension: Secondary | ICD-10-CM | POA: Diagnosis not present

## 2018-04-23 DIAGNOSIS — Z79899 Other long term (current) drug therapy: Secondary | ICD-10-CM | POA: Insufficient documentation

## 2018-04-23 DIAGNOSIS — Z91040 Latex allergy status: Secondary | ICD-10-CM | POA: Diagnosis not present

## 2018-04-23 DIAGNOSIS — Z87891 Personal history of nicotine dependence: Secondary | ICD-10-CM | POA: Diagnosis not present

## 2018-04-23 DIAGNOSIS — R531 Weakness: Secondary | ICD-10-CM | POA: Insufficient documentation

## 2018-04-23 LAB — TSH: TSH: 1.076 u[IU]/mL (ref 0.350–4.500)

## 2018-04-23 LAB — COMPREHENSIVE METABOLIC PANEL
ALT: 15 U/L (ref 14–54)
ANION GAP: 8 (ref 5–15)
AST: 26 U/L (ref 15–41)
Albumin: 4 g/dL (ref 3.5–5.0)
Alkaline Phosphatase: 76 U/L (ref 38–126)
BILIRUBIN TOTAL: 0.5 mg/dL (ref 0.3–1.2)
BUN: 11 mg/dL (ref 6–20)
CO2: 25 mmol/L (ref 22–32)
Calcium: 9.7 mg/dL (ref 8.9–10.3)
Chloride: 109 mmol/L (ref 101–111)
Creatinine, Ser: 0.92 mg/dL (ref 0.44–1.00)
GFR calc Af Amer: >60 mL/min (ref >60)
GFR, EST NON AFRICAN AMERICAN: 58 mL/min — AB (ref >60)
Glucose, Bld: 90 mg/dL (ref 65–99)
Potassium: 4.1 mmol/L (ref 3.5–5.1)
Sodium: 142 mmol/L (ref 135–145)
TOTAL PROTEIN: 7.7 g/dL (ref 6.5–8.1)

## 2018-04-23 LAB — URINALYSIS, ROUTINE W REFLEX MICROSCOPIC
BILIRUBIN URINE: NEGATIVE
Bacteria, UA: NONE SEEN
Glucose, UA: NEGATIVE mg/dL
Hgb urine dipstick: NEGATIVE
KETONES UR: NEGATIVE mg/dL
Nitrite: NEGATIVE
PH: 5 (ref 5.0–8.0)
Protein, ur: NEGATIVE mg/dL
Specific Gravity, Urine: 1.009 (ref 1.005–1.030)

## 2018-04-23 LAB — IRON AND TIBC
Iron: 104 ug/dL (ref 28–170)
Saturation Ratios: 28 % (ref 10.4–31.8)
TIBC: 371 ug/dL (ref 250–450)
UIBC: 267 ug/dL

## 2018-04-23 LAB — CBC
HCT: 36.3 % (ref 36.0–46.0)
Hemoglobin: 11.1 g/dL — ABNORMAL LOW (ref 12.0–15.0)
MCH: 27.2 pg (ref 26.0–34.0)
MCHC: 30.6 g/dL (ref 30.0–36.0)
MCV: 89 fL (ref 78.0–100.0)
PLATELETS: 310 10*3/uL (ref 150–400)
RBC: 4.08 MIL/uL (ref 3.87–5.11)
RDW: 14.5 % (ref 11.5–15.5)
WBC: 3.4 10*3/uL — ABNORMAL LOW (ref 4.0–10.5)

## 2018-04-23 LAB — TROPONIN I: Troponin I: <0.03 ng/mL (ref <0.03)

## 2018-04-23 LAB — FERRITIN: FERRITIN: 11 ng/mL (ref 11–307)

## 2018-04-23 NOTE — ED Triage Notes (Signed)
Generalized weakness for 4 days. Denies pain.

## 2018-04-23 NOTE — Discharge Instructions (Addendum)
Your labs today are stable, but you do need to keep your appointment with the hematologist next month as you are scheduled.  Get rechecked for any worsening symptoms, your labs and exam today are reassuring.

## 2018-04-23 NOTE — ED Notes (Signed)
Pt ambulated to restroom. Pt steady, no complaints of SOB or dizziness.

## 2018-04-23 NOTE — ED Provider Notes (Signed)
[facility] EMERGENCY DEPARTMENT Provider Note   CSN: [id number] Arrival date & time: 04/23/18  8250     History   Chief Complaint Chief Complaint  Patient presents with  . Weakness    HPI Melody Mitchell is a 77 y.o. female with a history of chronic anemia secondary to AVM of the colon presenting for evaluation of possible worsening anemia.  Per her chart she is followed by oncology here at [facility] for intermittent iron infusions secondary to intolerance to p.o. Iron.  Her last visit with heme-onc January 2019 revealed a hemoglobin 11.6, ferritin level within normal range as was her iron and TIBC panel.  She reports her iron "feels low" characterized by general nonfocal weakness which she first noticed 5 days ago.  She denies bleeding or unexplained bruising. States her stool is "always" dark but no has had no gross bloody bms. She also denies appetite changes, abdominal pain, cp, palpitations.  She is scheduled to see her hematologist next in 1 month.   The history is provided by the patient and medical records.    Past Medical History:  Diagnosis Date  . Acid reflux   . Arthritis   . 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 Bronchitis january 2014  . Hiatal hernia   . High cholesterol   . History of contact dermatitis   . Hx: recurrent pneumonia   . Hypertension   . IDA (iron deficiency anemia)    chronic iron infusions  . Reflux   . Vertigo     Patient Active Problem List   Diagnosis Date Noted  . Abnormal CT of the abdomen 02/02/2017  . Rectal bleeding 02/02/2017  . Aortic atherosclerosis ([place]) 01/03/2017  . Colitis 01/02/2017  . Hypokalemia 01/02/2017  . Heme positive stool 06/10/2015  . Change in bowel habits 06/10/2015  . Abdominal pain, epigastric 06/10/2015  . High cholesterol   . Acid reflux   . GERD 01/03/2011  . HEMORRHOIDS 04/15/2010  . Iron deficiency anemia due to chronic blood loss 01/12/2010  . AVM (arteriovenous  malformation) of colon, acquired 07/01/2009    Past Surgical History:  Procedure Laterality Date  . ABDOMINAL HYSTERECTOMY    . APPENDECTOMY    . BILATERAL BREAST BIOPSIES  BENIGN    . CHOLECYSTECTOMY    . COLONOSCOPY N/A 06/29/2015   SLF: The left colon is redundant 2. four colon polyps removed. No source  for change in bowel habits identified 3. Rectal bleeding due to large internal hemorrhoids   . COLONOSCOPY W/ BIOPSIES  6 2010   Dr. Oneida Alar: Polypoid appearing lesion of the ascending colon, 3 mm sessile rectal polyp, small internal hemorrhoids. Pathology revealed polypoid mucosa, no adenomatous changes  . Double-balloon enteroscopy, antegrade  April 2011   Dr. 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  Marland Kitchen ESOPHAGOGASTRODUODENOSCOPY  04/2009   Dr. Oneida Alar: Patent Schatzki ring dilated with advancing the scope, patchy erythema in the antrum, 2 small AVMs in the duodenal bulb, 2 additional AVMs noted in the second portion the duodenum, mild gastritis on pathology  . ESOPHAGOGASTRODUODENOSCOPY N/A 06/29/2015   SLF: 1. 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 small hiatal hernia 3. Mild non-erosive gastririts- NO obvious source for dyspepsia identified.   Marland Kitchen EYE SURGERY       OB History   None      Home Medications    Prior to Admission medications   Medication Sig Start Date  End Date Taking? Authorizing Provider  amitriptyline (ELAVIL) 25 MG tablet Take 25 mg by mouth at bedtime as needed for sleep.    Yes [provider]  Cetirizine HCl 10 MG CAPS Take 1 capsule by mouth daily.   Yes [provider]  docusate sodium (STOOL SOFTENER) 100 MG capsule Take 100 mg by mouth 2 (two) times daily.   Yes [provider]  escitalopram (LEXAPRO) 10 MG tablet Take 10 mg by mouth daily as needed (depression).  10/25/13  Yes [provider]  esomeprazole (NEXIUM) 40 MG capsule Take 40 mg by mouth daily before breakfast.     Yes  [provider]  fish oil-omega-3 fatty acids 1000 MG capsule Take 1 g by mouth daily.    Yes [provider]  fluticasone (FLONASE) 50 MCG/ACT nasal spray Place 2 sprays into both nostrils daily.  04/11/13  Yes [provider]  meclizine (ANTIVERT) 25 MG tablet Take 1 tablet (25 mg total) by mouth 3 (three) times daily as needed for dizziness. 09/13/16  Yes Rolland Porter, MD  methylcellulose (ARTIFICIAL TEARS) 1 % ophthalmic solution Place 1 drop into both eyes 2 (two) times daily as needed (dry eyes).   Yes [provider]  montelukast (SINGULAIR) 10 MG tablet Take 10 mg by mouth as needed.    Yes [provider]  Multiple Vitamin (MULTIVITAMIN) capsule Take 1 capsule by mouth daily.     Yes [provider]  potassium chloride SA (K-DUR,KLOR-CON) 20 MEQ tablet Take 1 tablet (20 mEq total) by mouth 2 (two) times daily. 02/27/17  Yes Kefalas, Manon Hilding, PA-C  traMADol (ULTRAM) 50 MG tablet Take 50 mg by mouth daily as needed for moderate pain.  09/24/13  Yes [provider]  triamcinolone cream (KENALOG) 0.1 % Apply 1 application topically daily as needed (rash).  11/24/16  Yes [provider]  ZETIA 10 MG tablet Take 10 mg by mouth every morning. 11/30/15  Yes [provider]    Family History Family History  Problem Relation Age of Onset  . Heart failure Mother   . Diabetes Sister   . Heart attack Father   . Colon cancer Neg Hx     Social History Social History   Tobacco Use  . Smoking status: Former Smoker    Packs/day: 1.00    Years: 25.00    Pack years: 25.00    Types: Cigarettes    Last attempt to quit: 09/14/2007    Years since quitting: 10.6  . Smokeless tobacco: Never Used  Substance Use Topics  . Alcohol use: Yes    Comment: occasional/rare (Brandy every now and then)  . Drug use: No     Allergies   Latex; Sulfa antibiotics; and Sulfonamide derivatives   Review of Systems Review of Systems   Constitutional: Negative for appetite change, chills and fever.  HENT: Negative.   Eyes: Negative.   Respiratory: Negative for chest tightness and shortness of breath.   Cardiovascular: Negative for chest pain and palpitations.  Gastrointestinal: Negative for abdominal pain, blood in stool and nausea.  Genitourinary: Negative.   Musculoskeletal: Negative for arthralgias, joint swelling and neck pain.  Skin: Negative.  Negative for rash and wound.  Neurological: Positive for weakness and light-headedness. Negative for dizziness, numbness and headaches.  Psychiatric/Behavioral: Negative.      Physical Exam Updated Vital Signs BP 125/81   Pulse 77   Temp 98.2 F (36.8 C) (Oral)   Resp 20  Wt 68.5 kg (151 lb)   SpO2 97%   BMI 25.92 kg/m   Physical Exam  Constitutional: She appears well-developed and well-nourished.  HENT:  Head: Normocephalic and atraumatic.  Eyes:  Conjunctival pallor  Neck: Normal range of motion.  Cardiovascular: Normal rate, regular rhythm, normal heart sounds and intact distal pulses.  Pulmonary/Chest: Effort normal and breath sounds normal. She has no wheezes.  Abdominal: Soft. Bowel sounds are normal. There is no tenderness. There is no guarding.  Musculoskeletal: Normal range of motion.  Neurological: She is alert.  Skin: Skin is warm and dry.  Psychiatric: She has a normal mood and affect.  Nursing note and vitals reviewed.    ED Treatments / Results  Labs (all labs ordered are listed, but only abnormal results are displayed) Labs Reviewed  URINALYSIS, ROUTINE W REFLEX MICROSCOPIC - Abnormal; Notable for the following components:      Result Value   APPearance HAZY (*)    Leukocytes, UA TRACE (*)    All other components within normal limits  COMPREHENSIVE METABOLIC PANEL - Abnormal; Notable for the following components:   GFR calc non Af Amer 58 (*)    All other components within normal limits  CBC - Abnormal; Notable for the following  components:   WBC 3.4 (*)    Hemoglobin 11.1 (*)    All other components within normal limits  TSH  TROPONIN I  FERRITIN  IRON AND TIBC    EKG EKG Interpretation  Date/Time:  Monday April 23 2018 08:34:53 EDT Ventricular Rate:  76 PR Interval:    QRS Duration: 91 QT Interval:  371 QTC Calculation: 418 R Axis:   0 Text Interpretation:  Sinus rhythm Ventricular premature complex Low voltage, precordial leads Borderline T abnormalities, anterior leads Confirmed by Elnora Morrison [phone]) on 04/23/2018 8:50:51 AM   Radiology No results found.  Procedures Procedures (including critical care time)  Medications Ordered in ED Medications - No data to display   Initial Impression / Assessment and Plan / ED Course  I have reviewed the triage vital signs and the nursing notes.  Pertinent labs & imaging results that were available during my care of the patient were reviewed by me and considered in my medical decision making (see chart for details).     Labs reviewed and stable, no significantly changed hemoglobin, iron studies pending (she will need this for her hematology appt next month). Encouraged f/u next month as scheduled. She ambulated in ed without sx. Tolerated PO intake.  No cp, ekg and trop neg, doubt cardiac source. tsh normal range so thyroid issue unlikely.  Final Clinical Impressions(s) / ED Diagnoses   Final diagnoses:  Generalized weakness    ED Discharge Orders    None       Landis Martins 04/23/18 1119    Elnora Morrison, MD 04/23/18 1550

## 2018-05-02 ENCOUNTER — Encounter (INDEPENDENT_AMBULATORY_CARE_PROVIDER_SITE_OTHER): Payer: Medicare Other | Admitting: Ophthalmology

## 2018-05-02 DIAGNOSIS — H348322 Tributary (branch) retinal vein occlusion, left eye, stable: Secondary | ICD-10-CM | POA: Diagnosis not present

## 2018-05-02 DIAGNOSIS — H43813 Vitreous degeneration, bilateral: Secondary | ICD-10-CM | POA: Diagnosis not present

## 2018-05-19 ENCOUNTER — Other Ambulatory Visit: Payer: Self-pay

## 2018-05-19 ENCOUNTER — Encounter (HOSPITAL_COMMUNITY): Payer: Self-pay | Admitting: Emergency Medicine

## 2018-05-19 ENCOUNTER — Emergency Department (HOSPITAL_COMMUNITY)
Admission: EM | Admit: 2018-05-19 | Discharge: 2018-05-19 | Disposition: A | Discharge status: Home / Self Care | Payer: Medicare Other | Attending: Emergency Medicine | Admitting: Emergency Medicine

## 2018-05-19 DIAGNOSIS — Z87891 Personal history of nicotine dependence: Secondary | ICD-10-CM | POA: Insufficient documentation

## 2018-05-19 DIAGNOSIS — Z91040 Latex allergy status: Secondary | ICD-10-CM | POA: Insufficient documentation

## 2018-05-19 DIAGNOSIS — Z79899 Other long term (current) drug therapy: Secondary | ICD-10-CM | POA: Diagnosis not present

## 2018-05-19 DIAGNOSIS — K625 Hemorrhage of anus and rectum: Secondary | ICD-10-CM | POA: Diagnosis present

## 2018-05-19 DIAGNOSIS — I10 Essential (primary) hypertension: Secondary | ICD-10-CM | POA: Diagnosis not present

## 2018-05-19 LAB — COMPREHENSIVE METABOLIC PANEL
ALBUMIN: 3.8 g/dL (ref 3.5–5.0)
ALT: 13 U/L (ref 0–44)
ANION GAP: 9 (ref 5–15)
AST: 23 U/L (ref 15–41)
Alkaline Phosphatase: 73 U/L (ref 38–126)
BUN: 10 mg/dL (ref 8–23)
CO2: 24 mmol/L (ref 22–32)
Calcium: 9.2 mg/dL (ref 8.9–10.3)
Chloride: 107 mmol/L (ref 98–111)
Creatinine, Ser: 0.97 mg/dL (ref 0.44–1.00)
GFR calc non Af Amer: 54 mL/min — ABNORMAL LOW (ref >60)
Glucose, Bld: 140 mg/dL — ABNORMAL HIGH (ref 70–99)
POTASSIUM: 3.8 mmol/L (ref 3.5–5.1)
SODIUM: 140 mmol/L (ref 135–145)
Total Bilirubin: 0.5 mg/dL (ref 0.3–1.2)
Total Protein: 7.5 g/dL (ref 6.5–8.1)

## 2018-05-19 LAB — CBC
HEMATOCRIT: 36.2 % (ref 36.0–46.0)
HEMOGLOBIN: 11.2 g/dL — AB (ref 12.0–15.0)
MCH: 27.6 pg (ref 26.0–34.0)
MCHC: 30.9 g/dL (ref 30.0–36.0)
MCV: 89.2 fL (ref 78.0–100.0)
Platelets: 322 10*3/uL (ref 150–400)
RBC: 4.06 MIL/uL (ref 3.87–5.11)
RDW: 15.1 % (ref 11.5–15.5)
WBC: 3.6 10*3/uL — ABNORMAL LOW (ref 4.0–10.5)

## 2018-05-19 LAB — PROTIME-INR
INR: 0.95
PROTHROMBIN TIME: 12.6 s (ref 11.4–15.2)

## 2018-05-19 LAB — TYPE AND SCREEN
ABO/RH(D): A POS
ANTIBODY SCREEN: NEGATIVE

## 2018-05-19 LAB — POC OCCULT BLOOD, ED: FECAL OCCULT BLD: NEGATIVE

## 2018-05-19 LAB — APTT: APTT: 26 s (ref 24–36)

## 2018-05-19 MED ORDER — SODIUM CHLORIDE 0.9 % IV SOLN
INTRAVENOUS | Status: DC
  Administered 2018-05-19: 10:00:00 via INTRAVENOUS

## 2018-05-19 NOTE — ED Triage Notes (Signed)
Pt c/o of blood in stool x 2 days with dizziness and tenderness in abdomen. Denies being on blood thinners

## 2018-05-19 NOTE — ED Provider Notes (Signed)
[facility] EMERGENCY DEPARTMENT Provider Note   CSN: [id number] Arrival date & time: 05/19/18  0801     History   Chief Complaint Chief Complaint  Patient presents with  . Rectal Bleeding    HPI Melody Mitchell is a 77 y.o. female.  HPI Patient presents to the emergency room for evaluation of rectal bleeding.  Patient states she had some symptoms yesterday.  She noticed a small amount of blood in her stool.  Today she had another episode when she went to the bathroom.  There was more blood than the last time.  She does feel fatigued and lightheaded.  She feels slightly weaker than usual.  Patient denies having abdominal pain.  She does not recall having a history of intestinal bleeding in the past although according to the medical records there is a diagnosis of an AVM in the colon.  Patient denies any trouble with nausea or vomiting.  She denies any fevers or chills. Past Medical History:  Diagnosis Date  . Acid reflux   . Arthritis   . 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 Bronchitis january 2014  . Hiatal hernia   . High cholesterol   . History of contact dermatitis   . Hx: recurrent pneumonia   . Hypertension   . IDA (iron deficiency anemia)    chronic iron infusions  . Reflux   . Vertigo     Patient Active Problem List   Diagnosis Date Noted  . Abnormal CT of the abdomen 02/02/2017  . Rectal bleeding 02/02/2017  . Aortic atherosclerosis ([place]) 01/03/2017  . Colitis 01/02/2017  . Hypokalemia 01/02/2017  . Heme positive stool 06/10/2015  . Change in bowel habits 06/10/2015  . Abdominal pain, epigastric 06/10/2015  . High cholesterol   . Acid reflux   . GERD 01/03/2011  . HEMORRHOIDS 04/15/2010  . Iron deficiency anemia due to chronic blood loss 01/12/2010  . 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 ABDOMINAL HYSTERECTOMY    . APPENDECTOMY    .  BILATERAL BREAST BIOPSIES  BENIGN    . CHOLECYSTECTOMY    . COLONOSCOPY N/A 06/29/2015   SLF: The left colon is redundant 2. four colon polyps removed. No source  for change in bowel habits identified 3. Rectal bleeding due to large internal hemorrhoids   . COLONOSCOPY W/ BIOPSIES  6 2010   Dr. Oneida Alar: Polypoid appearing lesion of the ascending colon, 3 mm sessile rectal polyp, small internal hemorrhoids. Pathology revealed polypoid mucosa, no adenomatous changes  . Double-balloon enteroscopy, antegrade  April 2011   Dr. 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  Marland Kitchen ESOPHAGOGASTRODUODENOSCOPY  04/2009   Dr. Oneida Alar: Patent Schatzki ring dilated with advancing the scope, patchy erythema in the antrum, 2 small AVMs in the duodenal bulb, 2 additional AVMs noted in the second portion the duodenum, mild gastritis on pathology  . ESOPHAGOGASTRODUODENOSCOPY N/A 06/29/2015   SLF: 1. 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 small hiatal hernia 3. Mild non-erosive gastririts- NO obvious source for dyspepsia identified.   Marland Kitchen EYE SURGERY       OB History   None      Home Medications    Prior to Admission medications   Medication Sig Start Date End Date Taking? Authorizing Provider  amitriptyline (ELAVIL) 25 MG tablet Take 25 mg by mouth at bedtime as needed for sleep.  [provider]  Cetirizine HCl 10 MG CAPS Take 1 capsule by mouth daily.    [provider]  docusate sodium (STOOL SOFTENER) 100 MG capsule Take 100 mg by mouth 2 (two) times daily.    [provider]  escitalopram (LEXAPRO) 10 MG tablet Take 10 mg by mouth daily as needed (depression).  10/25/13   [provider]  esomeprazole (NEXIUM) 40 MG capsule Take 40 mg by mouth daily before breakfast.      [provider]  fish oil-omega-3 fatty acids 1000 MG capsule Take 1 g by mouth daily.     [provider]  fluticasone (FLONASE) 50 MCG/ACT nasal  spray Place 2 sprays into both nostrils daily.  04/11/13   [provider]  meclizine (ANTIVERT) 25 MG tablet Take 1 tablet (25 mg total) by mouth 3 (three) times daily as needed for dizziness. 09/13/16   Rolland Porter, MD  methylcellulose (ARTIFICIAL TEARS) 1 % ophthalmic solution Place 1 drop into both eyes 2 (two) times daily as needed (dry eyes).    [provider]  montelukast (SINGULAIR) 10 MG tablet Take 10 mg by mouth as needed.     [provider]  Multiple Vitamin (MULTIVITAMIN) capsule Take 1 capsule by mouth daily.      [provider]  potassium chloride SA (K-DUR,KLOR-CON) 20 MEQ tablet Take 1 tablet (20 mEq total) by mouth 2 (two) times daily. 02/27/17   Baird Cancer, PA-C  traMADol (ULTRAM) 50 MG tablet Take 50 mg by mouth daily as needed for moderate pain.  09/24/13   [provider]  triamcinolone cream (KENALOG) 0.1 % Apply 1 application topically daily as needed (rash).  11/24/16   [provider]  ZETIA 10 MG tablet Take 10 mg by mouth every morning. 11/30/15   [provider]    Family History Family History  Problem Relation Age of Onset  . Heart failure Mother   . Diabetes Sister   . Heart attack Father   . Colon cancer Neg Hx     Social History Social History   Tobacco Use  . Smoking status: Former Smoker    Packs/day: 1.00    Years: 25.00    Pack years: 25.00    Types: Cigarettes    Last attempt to quit: 09/14/2007    Years since quitting: 10.6  . Smokeless tobacco: Never Used  Substance Use Topics  . Alcohol use: Yes    Comment: occasional/rare (Brandy every now and then)  . Drug use: No     Allergies   Latex; Sulfa antibiotics; and Sulfonamide derivatives   Review of Systems Review of Systems  All other systems reviewed and are negative.    Physical Exam Updated Vital Signs BP 124/89   Pulse 80   Temp 97.6 F (36.4 C) (Oral)   Resp 18   Ht 1.626 m (5\' 4" )   Wt 68 kg (150  lb)   SpO2 96%   BMI 25.75 kg/m   Physical Exam  Constitutional: She appears well-developed and well-nourished. No distress.  HENT:  Head: Normocephalic and atraumatic.  Right Ear: External ear normal.  Left Ear: External ear normal.  Eyes: Conjunctivae are normal. Right eye exhibits no discharge. Left eye exhibits no discharge. No scleral icterus.  Neck: Neck supple. No tracheal deviation present.  Cardiovascular: Normal rate, regular rhythm and intact distal pulses.  Pulmonary/Chest: Effort normal and breath sounds normal. No stridor. No respiratory distress. She has no wheezes.  She has no rales.  Abdominal: Soft. Bowel sounds are normal. She exhibits no distension. There is no tenderness. There is no rebound and no guarding.  Genitourinary:  Genitourinary Comments: Stools are dark in color, no gross blood in the rectum  Musculoskeletal: She exhibits no edema or tenderness.  Neurological: She is alert. She has normal strength. No cranial nerve deficit (no facial droop, extraocular movements intact, no slurred speech) or sensory deficit. She exhibits normal muscle tone. She displays no seizure activity. Coordination normal.  Skin: Skin is warm and dry. No rash noted. She is not diaphoretic.  Psychiatric: She has a normal mood and affect.  Nursing note and vitals reviewed.    ED Treatments / Results  Labs (all labs ordered are listed, but only abnormal results are displayed) Labs Reviewed  COMPREHENSIVE METABOLIC PANEL - Abnormal; Notable for the following components:      Result Value   Glucose, Bld 140 (*)    GFR calc non Af Amer 54 (*)    All other components within normal limits  CBC - Abnormal; Notable for the following components:   WBC 3.6 (*)    Hemoglobin 11.2 (*)    All other components within normal limits  APTT  PROTIME-INR  POC OCCULT BLOOD, ED  TYPE AND SCREEN     Procedures Procedures (including critical care time)  Medications Ordered in  ED Medications  0.9 %  sodium chloride infusion ( Intravenous New Bag/Given 05/19/18 0935)     Initial Impression / Assessment and Plan / ED Course  I have reviewed the triage vital signs and the nursing notes.  Pertinent labs & imaging results that were available during my care of the patient were reviewed by me and considered in my medical decision making (see chart for details).   Patient presented to the emergency room for evaluation of rectal bleeding.  Patient denied having any abdominal pain to me and she had no tenderness on exam.  Patient's ER evaluation is reassuring.  Her hemoglobin is stable at 11.2.  Her Hemoccult was also negative.  Patient appears safe for outpatient follow-up with her primary care doctor and possibly her gastroenterologist.  Patient understands to monitor for increasing blood in her stool and any worsening symptoms.  Final Clinical Impressions(s) / ED Diagnoses   Final diagnoses:  Rectal bleeding    ED Discharge Orders    None       Dorie Rank, MD 05/19/18 1102

## 2018-05-19 NOTE — Discharge Instructions (Signed)
Follow-up with your primary care doctor and Dr. Oneida Alar.  Return to the emergency room for increasing bleeding.

## 2018-05-30 ENCOUNTER — Inpatient Hospital Stay (HOSPITAL_COMMUNITY): Payer: Medicare Other | Attending: Hematology | Admitting: Hematology

## 2018-05-30 ENCOUNTER — Encounter (HOSPITAL_COMMUNITY): Payer: Self-pay | Admitting: Hematology

## 2018-05-30 ENCOUNTER — Encounter (INDEPENDENT_AMBULATORY_CARE_PROVIDER_SITE_OTHER): Payer: Medicare Other | Admitting: Ophthalmology

## 2018-05-30 ENCOUNTER — Other Ambulatory Visit: Payer: Self-pay

## 2018-05-30 DIAGNOSIS — E7800 Pure hypercholesterolemia, unspecified: Secondary | ICD-10-CM | POA: Insufficient documentation

## 2018-05-30 DIAGNOSIS — D500 Iron deficiency anemia secondary to blood loss (chronic): Secondary | ICD-10-CM

## 2018-05-30 DIAGNOSIS — R42 Dizziness and giddiness: Secondary | ICD-10-CM | POA: Insufficient documentation

## 2018-05-30 DIAGNOSIS — Z87891 Personal history of nicotine dependence: Secondary | ICD-10-CM | POA: Insufficient documentation

## 2018-05-30 DIAGNOSIS — K449 Diaphragmatic hernia without obstruction or gangrene: Secondary | ICD-10-CM

## 2018-05-30 DIAGNOSIS — Z79899 Other long term (current) drug therapy: Secondary | ICD-10-CM | POA: Insufficient documentation

## 2018-05-30 DIAGNOSIS — I10 Essential (primary) hypertension: Secondary | ICD-10-CM | POA: Diagnosis not present

## 2018-05-30 DIAGNOSIS — D509 Iron deficiency anemia, unspecified: Secondary | ICD-10-CM | POA: Insufficient documentation

## 2018-05-30 DIAGNOSIS — M129 Arthropathy, unspecified: Secondary | ICD-10-CM | POA: Diagnosis not present

## 2018-05-30 DIAGNOSIS — K219 Gastro-esophageal reflux disease without esophagitis: Secondary | ICD-10-CM | POA: Diagnosis not present

## 2018-05-30 NOTE — Assessment & Plan Note (Signed)
1.  Iron deficiency anemia: -This is from small bowel AVMs, as evidenced by EGD in 2011.  Repeat EGD and colonoscopy in 2016 did not show any evidence of active bleeding areas.  Large internal hemorrhoids were seen. - Patient had reported bleeding per rectum once a week.  Melody Mitchell had 1 or 2 ER visits in the last few months.  Stool for occult blood was negative. -Recent CBC on 05/19/2018 showed stable hemoglobin around 11.2.  Her last [name] infusion was on 09/07/2017.  However her ferritin has come down to 11 when checked in June.  Hence I have recommended 2 infusions of [name] 1 week apart. - We will follow her in 6 months with repeat blood work.

## 2018-05-30 NOTE — Progress Notes (Signed)
[place], [place] [address]   CLINIC:  Medical Oncology/Hematology  PCP:  [name], MD [address] [place] [address] [phone]   REASON FOR VISIT:  Follow-up for iron defieiceny anemia due to chronic blood loss AVMs  CURRENT THERAPY: feraheme Infusions   INTERVAL HISTORY:  [name] 77 y.o. female returns for routine follow-up for iron defieiceny anemia. Patient is doing well since her last visit. She has started to experience fatigue and weakness since her last feraheme infusions. Her energy levels are around 50%. Her appetite is about 75%. Otherwise she feels good.      REVIEW OF SYSTEMS:  Review of Systems  Constitutional: Negative.   HENT:  Negative.   Eyes: Negative.   Respiratory: Negative.   Cardiovascular: Negative.   Gastrointestinal: Positive for constipation.  Endocrine: Negative.   Genitourinary: Negative.    Skin: Negative.   Neurological: Positive for extremity weakness and numbness.  Hematological: Negative.   Psychiatric/Behavioral: Negative.      PAST MEDICAL/SURGICAL HISTORY:  Past Medical History:  Diagnosis Date  . Acid reflux   . Arthritis   . 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 Bronchitis january 2014  . Hiatal hernia   . High cholesterol   . History of contact dermatitis   . Hx: recurrent pneumonia   . Hypertension   . IDA (iron deficiency anemia)    chronic iron infusions  . Reflux   . Vertigo    Past Surgical History:  Procedure Laterality Date  . ABDOMINAL HYSTERECTOMY    . APPENDECTOMY    . BILATERAL BREAST BIOPSIES  BENIGN    . CHOLECYSTECTOMY    . COLONOSCOPY N/A 06/29/2015   SLF: The left colon is redundant 2. four colon polyps removed. No source  for change in bowel habits identified 3. Rectal bleeding due to large internal hemorrhoids   . COLONOSCOPY W/ BIOPSIES  6 2010   Dr. Oneida Alar: Polypoid appearing lesion of the ascending  colon, 3 mm sessile rectal polyp, small internal hemorrhoids. Pathology revealed polypoid mucosa, no adenomatous changes  . Double-balloon enteroscopy, antegrade  April 2011   Dr. 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  Marland Kitchen ESOPHAGOGASTRODUODENOSCOPY  04/2009   Dr. Oneida Alar: Patent Schatzki ring dilated with advancing the scope, patchy erythema in the antrum, 2 small AVMs in the duodenal bulb, 2 additional AVMs noted in the second portion the duodenum, mild gastritis on pathology  . ESOPHAGOGASTRODUODENOSCOPY N/A 06/29/2015   SLF: 1. 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 small hiatal hernia 3. Mild non-erosive gastririts- NO obvious source for dyspepsia identified.   Marland Kitchen EYE SURGERY       SOCIAL HISTORY:  Social History   Socioeconomic History  . Marital status: Divorced    Spouse name: Not on file  . Number of children: 1  . Years of education: Not on file  . Highest education level: Not on file  Occupational History  . Not on file  Social Needs  . Financial resource strain: Not on file  . Food insecurity:    Worry: Not on file    Inability: Not on file  . Transportation needs:    Medical: Not on file    Non-medical: Not on file  Tobacco Use  . Smoking status: Former Smoker    Packs/day: 1.00    Years: 25.00    Pack years: 25.00  Types: Cigarettes    Last attempt to quit: 09/14/2007    Years since quitting: 10.7  . Smokeless tobacco: Never Used  Substance and Sexual Activity  . Alcohol use: Yes    Comment: occasional/rare (Brandy every now and then)  . Drug use: No  . Sexual activity: Yes    Birth control/protection: Post-menopausal, Surgical  Lifestyle  . Physical activity:    Days per week: Not on file    Minutes per session: Not on file  . Stress: Not on file  Relationships  . Social connections:    Talks on phone: Not on file    Gets together: Not on file    Attends religious service: Not on file    Active member of club  or organization: Not on file    Attends meetings of clubs or organizations: Not on file    Relationship status: Not on file  . Intimate partner violence:    Fear of current or ex partner: Not on file    Emotionally abused: Not on file    Physically abused: Not on file    Forced sexual activity: Not on file  Other Topics Concern  . Not on file  Social History Narrative  . Not on file    FAMILY HISTORY:  Family History  Problem Relation Age of Onset  . Heart failure Mother   . Diabetes Sister   . Heart attack Father   . Colon cancer Neg Hx     CURRENT MEDICATIONS:  Outpatient Encounter Medications as of 05/30/2018  Medication Sig Note  . amitriptyline (ELAVIL) 25 MG tablet Take 25 mg by mouth at bedtime as needed for sleep.    . Azelastine HCl 137 MCG/SPRAY SOLN    . Cetirizine HCl 10 MG CAPS Take 1 capsule by mouth daily.   Marland Kitchen docusate sodium (STOOL SOFTENER) 100 MG capsule Take 100 mg by mouth 2 (two) times daily.   Marland Kitchen escitalopram (LEXAPRO) 10 MG tablet Take 10 mg by mouth daily as needed (depression).    Marland Kitchen esomeprazole (NEXIUM) 40 MG capsule Take 40 mg by mouth daily before breakfast.     . fish oil-omega-3 fatty acids 1000 MG capsule Take 1 g by mouth daily.    . fluticasone (FLONASE) 50 MCG/ACT nasal spray Place 2 sprays into both nostrils daily.    . meclizine (ANTIVERT) 25 MG tablet Take 1 tablet (25 mg total) by mouth 3 (three) times daily as needed for dizziness.   . methylcellulose (ARTIFICIAL TEARS) 1 % ophthalmic solution Place 1 drop into both eyes 2 (two) times daily as needed (dry eyes).   . mometasone (ELOCON) 0.1 % ointment    . montelukast (SINGULAIR) 10 MG tablet Take 10 mg by mouth as needed.    . Multiple Vitamin (MULTIVITAMIN) capsule Take 1 capsule by mouth daily.     . potassium chloride SA (K-DUR,KLOR-CON) 20 MEQ tablet Take 1 tablet (20 mEq total) by mouth 2 (two) times daily. 06/01/2017: Once a day  . traMADol (ULTRAM) 50 MG tablet Take 50 mg by mouth  daily as needed for moderate pain.    Marland Kitchen triamcinolone cream (KENALOG) 0.1 % Apply 1 application topically daily as needed (rash).    Marland Kitchen ZETIA 10 MG tablet Take 10 mg by mouth every morning.    No facility-administered encounter medications on file as of 05/30/2018.     ALLERGIES:  Allergies  Allergen Reactions  . Latex Hives, Itching and Rash  . Sulfa Antibiotics Hives and  Itching  . Sulfonamide Derivatives Hives and Itching     PHYSICAL EXAM:  ECOG Performance status: 1  Vitals:   05/30/18 1156  BP: 118/79  Pulse: 71  Resp: 16  Temp: 98.5 F (36.9 C)  SpO2: 99%   Filed Weights   05/30/18 1156  Weight: 151 lb 8 oz (68.7 kg)    Physical Exam   LABORATORY DATA:  I have reviewed the labs as listed.  CBC    Component Value Date/Time   WBC 3.6 (L) 05/19/2018 0805   RBC 4.06 05/19/2018 0805   HGB 11.2 (L) 05/19/2018 0805   HCT 36.2 05/19/2018 0805   PLT 322 05/19/2018 0805   MCV 89.2 05/19/2018 0805   MCH 27.6 05/19/2018 0805   MCHC 30.9 05/19/2018 0805   RDW 15.1 05/19/2018 0805   LYMPHSABS 1.9 11/29/2017 1113   MONOABS 0.3 11/29/2017 1113   EOSABS 0.1 11/29/2017 1113   BASOSABS 0.0 11/29/2017 1113   CMP Latest Ref Rng & Units 05/19/2018 04/23/2018 11/29/2017  Glucose 70 - 99 mg/dL 140(H) 90 82  BUN 8 - 23 mg/dL 10 11 14   Creatinine 0.44 - 1.00 mg/dL 0.97 0.92 0.87  Sodium 135 - 145 mmol/L 140 142 138  Potassium 3.5 - 5.1 mmol/L 3.8 4.1 4.0  Chloride 98 - 111 mmol/L 107 109 104  CO2 22 - 32 mmol/L 24 25 22   Calcium 8.9 - 10.3 mg/dL 9.2 9.7 9.5  Total Protein 6.5 - 8.1 g/dL 7.5 7.7 7.5  Total Bilirubin 0.3 - 1.2 mg/dL 0.5 0.5 0.4  Alkaline Phos 38 - 126 U/L 73 76 84  AST 15 - 41 U/L 23 26 22   ALT 0 - 44 U/L 13 15 15         ASSESSMENT & PLAN:   Iron deficiency anemia due to chronic blood loss 1.  Iron deficiency anemia: -This is from small bowel AVMs, as evidenced by EGD in 2011.  Repeat EGD and colonoscopy in 2016 did not show any evidence of active  bleeding areas.  Large internal hemorrhoids were seen. - Patient had reported bleeding per rectum once a week.  She had 1 or 2 ER visits in the last few months.  Stool for occult blood was negative. -Recent CBC on 05/19/2018 showed stable hemoglobin around 11.2.  Her last [name] infusion was on 09/07/2017.  However her ferritin has come down to 11 when checked in June.  Hence I have recommended 2 infusions of [name] 1 week apart. - We will follow her in 6 months with repeat blood work.      Orders placed this encounter:  No orders of the defined types were placed in this encounter.     Derek Jack, MD [place] [phone]

## 2018-05-31 ENCOUNTER — Encounter (INDEPENDENT_AMBULATORY_CARE_PROVIDER_SITE_OTHER): Payer: Medicare Other | Admitting: Ophthalmology

## 2018-05-31 DIAGNOSIS — H348322 Tributary (branch) retinal vein occlusion, left eye, stable: Secondary | ICD-10-CM | POA: Diagnosis not present

## 2018-05-31 DIAGNOSIS — H35033 Hypertensive retinopathy, bilateral: Secondary | ICD-10-CM | POA: Diagnosis not present

## 2018-05-31 DIAGNOSIS — I10 Essential (primary) hypertension: Secondary | ICD-10-CM | POA: Diagnosis not present

## 2018-05-31 DIAGNOSIS — H43813 Vitreous degeneration, bilateral: Secondary | ICD-10-CM | POA: Diagnosis not present

## 2018-06-07 ENCOUNTER — Inpatient Hospital Stay (HOSPITAL_COMMUNITY): Payer: Medicare Other

## 2018-06-07 VITALS — BP 113/69 | HR 82 | Temp 98.7°F | Resp 18 | Wt 153.0 lb

## 2018-06-07 DIAGNOSIS — D509 Iron deficiency anemia, unspecified: Secondary | ICD-10-CM | POA: Diagnosis not present

## 2018-06-07 DIAGNOSIS — D500 Iron deficiency anemia secondary to blood loss (chronic): Secondary | ICD-10-CM

## 2018-06-07 MED ORDER — SODIUM CHLORIDE 0.9 % IV SOLN
510.0000 mg | Freq: Once | INTRAVENOUS | Status: AC
  Administered 2018-06-07: 510 mg via INTRAVENOUS
  Filled 2018-06-07: qty 17

## 2018-06-07 MED ORDER — SODIUM CHLORIDE 0.9 % IV SOLN
Freq: Once | INTRAVENOUS | Status: AC
  Administered 2018-06-07: 13:00:00 via INTRAVENOUS

## 2018-06-07 NOTE — Patient Instructions (Signed)
[facility] at [facility]  Discharge Instructions:  You received an iron infusion today.  _______________________________________________________________  Thank you for choosing [facility] at [facility] to provide your oncology and hematology care.  To afford each patient quality time with our providers, please arrive at least 15 minutes before your scheduled appointment.  You need to re-schedule your appointment if you arrive 10 or more minutes late.  We strive to give you quality time with our providers, and arriving late affects you and other patients whose appointments are after yours.  Also, if you no show three or more times for appointments you may be dismissed from the clinic.  Again, thank you for choosing [facility] at [facility]. Our hope is that these requests will allow you access to exceptional care and in a timely manner. _______________________________________________________________  If you have questions after your visit, please contact our office at [phone] between the hours of 8:30 a.m. and 5:00 p.m. Voicemails left after 4:30 p.m. will not be returned until the following business day. _______________________________________________________________  For prescription refill requests, have your pharmacy contact our office. _______________________________________________________________  Recommendations made by the consultant and any test results will be sent to your referring physician. _______________________________________________________________ 

## 2018-06-07 NOTE — Progress Notes (Signed)
Patient tolerated iron infusion with no complaints voiced.  Peripheral IV site clean and dry with no bruising or swelling noted at site.  Good blood return noted before and after administration of iron.  Band aid applied.  VSS with discharge and left ambulatory with no s/s of distress noted.  

## 2018-06-14 ENCOUNTER — Ambulatory Visit (HOSPITAL_COMMUNITY): Payer: Medicare Other

## 2018-06-15 ENCOUNTER — Inpatient Hospital Stay (HOSPITAL_COMMUNITY): Payer: Medicare Other | Attending: Internal Medicine

## 2018-06-15 VITALS — BP 113/73 | HR 68 | Temp 98.6°F | Resp 18

## 2018-06-15 DIAGNOSIS — Z79899 Other long term (current) drug therapy: Secondary | ICD-10-CM | POA: Insufficient documentation

## 2018-06-15 DIAGNOSIS — D500 Iron deficiency anemia secondary to blood loss (chronic): Secondary | ICD-10-CM

## 2018-06-15 DIAGNOSIS — D509 Iron deficiency anemia, unspecified: Secondary | ICD-10-CM | POA: Insufficient documentation

## 2018-06-15 MED ORDER — SODIUM CHLORIDE 0.9 % IV SOLN
510.0000 mg | Freq: Once | INTRAVENOUS | Status: AC
  Administered 2018-06-15: 510 mg via INTRAVENOUS
  Filled 2018-06-15: qty 17

## 2018-06-15 MED ORDER — SODIUM CHLORIDE 0.9 % IV SOLN
Freq: Once | INTRAVENOUS | Status: AC
  Administered 2018-06-15: 13:00:00 via INTRAVENOUS

## 2018-06-15 NOTE — Patient Instructions (Signed)
[facility] at [facility]  Discharge Instructions:  You received an iron infusion today.  _______________________________________________________________  Thank you for choosing [facility] at [facility] to provide your oncology and hematology care.  To afford each patient quality time with our providers, please arrive at least 15 minutes before your scheduled appointment.  You need to re-schedule your appointment if you arrive 10 or more minutes late.  We strive to give you quality time with our providers, and arriving late affects you and other patients whose appointments are after yours.  Also, if you no show three or more times for appointments you may be dismissed from the clinic.  Again, thank you for choosing [facility] at [facility]. Our hope is that these requests will allow you access to exceptional care and in a timely manner. _______________________________________________________________  If you have questions after your visit, please contact our office at [phone] between the hours of 8:30 a.m. and 5:00 p.m. Voicemails left after 4:30 p.m. will not be returned until the following business day. _______________________________________________________________  For prescription refill requests, have your pharmacy contact our office. _______________________________________________________________  Recommendations made by the consultant and any test results will be sent to your referring physician. _______________________________________________________________ 

## 2018-06-15 NOTE — Progress Notes (Signed)
Patient tolerated iron infusion with no complaints voiced.  Good blood return noted before and after administration of iron.  No bruising or swelling noted at site.  Band aid applied.  VSs with discharge and left ambulatory with family with no s/s of distress noted.

## 2018-06-25 ENCOUNTER — Other Ambulatory Visit: Payer: Self-pay

## 2018-06-25 ENCOUNTER — Emergency Department (HOSPITAL_COMMUNITY)
Admission: EM | Admit: 2018-06-25 | Discharge: 2018-06-25 | Disposition: A | Discharge status: Home / Self Care | Payer: Medicare Other | Attending: Emergency Medicine | Admitting: Emergency Medicine

## 2018-06-25 ENCOUNTER — Encounter (HOSPITAL_COMMUNITY): Payer: Self-pay | Admitting: Emergency Medicine

## 2018-06-25 ENCOUNTER — Emergency Department (HOSPITAL_COMMUNITY): Payer: Medicare Other

## 2018-06-25 DIAGNOSIS — R42 Dizziness and giddiness: Secondary | ICD-10-CM | POA: Insufficient documentation

## 2018-06-25 DIAGNOSIS — Z79899 Other long term (current) drug therapy: Secondary | ICD-10-CM | POA: Diagnosis not present

## 2018-06-25 DIAGNOSIS — Z87891 Personal history of nicotine dependence: Secondary | ICD-10-CM | POA: Diagnosis not present

## 2018-06-25 DIAGNOSIS — Z91040 Latex allergy status: Secondary | ICD-10-CM | POA: Insufficient documentation

## 2018-06-25 DIAGNOSIS — I10 Essential (primary) hypertension: Secondary | ICD-10-CM | POA: Diagnosis not present

## 2018-06-25 LAB — CBC
HEMATOCRIT: 36.6 % (ref 36.0–46.0)
HEMOGLOBIN: 11.7 g/dL — AB (ref 12.0–15.0)
MCH: 28.8 pg (ref 26.0–34.0)
MCHC: 32 g/dL (ref 30.0–36.0)
MCV: 90.1 fL (ref 78.0–100.0)
Platelets: 274 10*3/uL (ref 150–400)
RBC: 4.06 MIL/uL (ref 3.87–5.11)
RDW: 16.7 % — ABNORMAL HIGH (ref 11.5–15.5)
WBC: 5.7 10*3/uL (ref 4.0–10.5)

## 2018-06-25 LAB — COMPREHENSIVE METABOLIC PANEL
ALBUMIN: 4 g/dL (ref 3.5–5.0)
ALK PHOS: 79 U/L (ref 38–126)
ALT: 20 U/L (ref 0–44)
AST: 32 U/L (ref 15–41)
Anion gap: 8 (ref 5–15)
BILIRUBIN TOTAL: 0.5 mg/dL (ref 0.3–1.2)
BUN: 12 mg/dL (ref 8–23)
CALCIUM: 9.4 mg/dL (ref 8.9–10.3)
CO2: 24 mmol/L (ref 22–32)
Chloride: 107 mmol/L (ref 98–111)
Creatinine, Ser: 0.92 mg/dL (ref 0.44–1.00)
GFR calc Af Amer: >60 mL/min (ref >60)
GFR calc non Af Amer: 58 mL/min — ABNORMAL LOW (ref >60)
GLUCOSE: 108 mg/dL — AB (ref 70–99)
Potassium: 3.8 mmol/L (ref 3.5–5.1)
Sodium: 139 mmol/L (ref 135–145)
Total Protein: 7.6 g/dL (ref 6.5–8.1)

## 2018-06-25 LAB — I-STAT CHEM 8, ED
BUN: 10 mg/dL (ref 8–23)
CREATININE: 1 mg/dL (ref 0.44–1.00)
Calcium, Ion: 1.13 mmol/L — ABNORMAL LOW (ref 1.15–1.40)
Chloride: 105 mmol/L (ref 98–111)
Glucose, Bld: 106 mg/dL — ABNORMAL HIGH (ref 70–99)
HEMATOCRIT: 37 % (ref 36.0–46.0)
Hemoglobin: 12.6 g/dL (ref 12.0–15.0)
POTASSIUM: 3.8 mmol/L (ref 3.5–5.1)
SODIUM: 140 mmol/L (ref 135–145)
TCO2: 23 mmol/L (ref 22–32)

## 2018-06-25 LAB — DIFFERENTIAL
BASOS ABS: 0 10*3/uL (ref 0.0–0.1)
Basophils Relative: 0 %
Eosinophils Absolute: 0.1 10*3/uL (ref 0.0–0.7)
Eosinophils Relative: 2 %
LYMPHS PCT: 31 %
Lymphs Abs: 1.7 10*3/uL (ref 0.7–4.0)
MONO ABS: 0.3 10*3/uL (ref 0.1–1.0)
Monocytes Relative: 6 %
NEUTROS ABS: 3.4 10*3/uL (ref 1.7–7.7)
Neutrophils Relative %: 61 %

## 2018-06-25 LAB — URINALYSIS, ROUTINE W REFLEX MICROSCOPIC
BACTERIA UA: NONE SEEN
Bilirubin Urine: NEGATIVE
Glucose, UA: NEGATIVE mg/dL
Hgb urine dipstick: NEGATIVE
KETONES UR: NEGATIVE mg/dL
Nitrite: NEGATIVE
PH: 6 (ref 5.0–8.0)
Protein, ur: NEGATIVE mg/dL
SPECIFIC GRAVITY, URINE: 1.017 (ref 1.005–1.030)

## 2018-06-25 LAB — TROPONIN I
Troponin I: <0.03 ng/mL (ref <0.03)
Troponin I: <0.03 ng/mL (ref <0.03)

## 2018-06-25 MED ORDER — ASPIRIN 81 MG PO CHEW: 324.0000 mg | CHEWABLE_TABLET | Freq: Once | ORAL | Status: DC

## 2018-06-25 MED ORDER — SODIUM CHLORIDE 0.9 % IV BOLUS
500.0000 mL | Freq: Once | INTRAVENOUS | Status: AC
  Administered 2018-06-25: 500 mL via INTRAVENOUS

## 2018-06-25 MED ORDER — SODIUM CHLORIDE 0.9 % IV SOLN
INTRAVENOUS | Status: DC
  Administered 2018-06-25: 125 mL/h via INTRAVENOUS

## 2018-06-25 NOTE — ED Notes (Signed)
Ok to feed patient per EDP, pt given a meal tray

## 2018-06-25 NOTE — ED Notes (Signed)
Recent death in the family plan to travel to [place] the weekend. Wondering if this is stress?

## 2018-06-25 NOTE — Discharge Instructions (Signed)
Continue your current medications, return to the emergency room if you start having trouble with your balance or coordination, vision or develop other concerning symptoms

## 2018-06-25 NOTE — ED Provider Notes (Signed)
[facility] EMERGENCY DEPARTMENT Provider Note   CSN: [id number] Arrival date & time: 06/25/18  1548     History   Chief Complaint Chief Complaint  Patient presents with  . Dizziness    HPI Melody Mitchell is a 77 y.o. female.  HPI Pt presented to the ED for evaluation of lightheadedness.  Patient states she noticed feeling dizzy and lightheaded when she woke up this morning.  Patient felt weak when she was standing.  She did not have a sensation of the room spinning.  She denies any trouble with her speech or her vision.  She does not have focal numbness or weakness.  She denies any trouble with chest pain or shortness of breath.  No trouble with vomiting or diarrhea.  No dysuria.  No blood in her stool.  She decided to go to her primary care doctor's office today and they sent her to the emergency room for further evaluation.  Patient did have a death in the family this weekend.  She had to go to a funeral and this has upset her and wonders if it could be related. Past Medical History:  Diagnosis Date  . Acid reflux   . Arthritis   . 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 Bronchitis january 2014  . Hiatal hernia   . High cholesterol   . History of contact dermatitis   . Hx: recurrent pneumonia   . Hypertension   . IDA (iron deficiency anemia)    chronic iron infusions  . Reflux   . Vertigo     Patient Active Problem List   Diagnosis Date Noted  . Abnormal CT of the abdomen 02/02/2017  . Rectal bleeding 02/02/2017  . Aortic atherosclerosis ([place]) 01/03/2017  . Colitis 01/02/2017  . Hypokalemia 01/02/2017  . Heme positive stool 06/10/2015  . Change in bowel habits 06/10/2015  . Abdominal pain, epigastric 06/10/2015  . High cholesterol   . Acid reflux   . GERD 01/03/2011  . HEMORRHOIDS 04/15/2010  . Iron deficiency anemia due to chronic blood loss 01/12/2010  . 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 ABDOMINAL HYSTERECTOMY    . APPENDECTOMY    . BILATERAL BREAST BIOPSIES  BENIGN    . CHOLECYSTECTOMY    . COLONOSCOPY N/A 06/29/2015   SLF: The left colon is redundant 2. four colon polyps removed. No source  for change in bowel habits identified 3. Rectal bleeding due to large internal hemorrhoids   . COLONOSCOPY W/ BIOPSIES  6 2010   Dr. Oneida Alar: Polypoid appearing lesion of the ascending colon, 3 mm sessile rectal polyp, small internal hemorrhoids. Pathology revealed polypoid mucosa, no adenomatous changes  . Double-balloon enteroscopy, antegrade  April 2011   Dr. 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  Marland Kitchen ESOPHAGOGASTRODUODENOSCOPY  04/2009   Dr. Oneida Alar: Patent Schatzki ring dilated with advancing the scope, patchy erythema in the antrum, 2 small AVMs in the duodenal bulb, 2 additional AVMs noted in the second portion the duodenum, mild gastritis on pathology  . ESOPHAGOGASTRODUODENOSCOPY N/A 06/29/2015   SLF: 1. 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 small hiatal hernia 3. Mild non-erosive gastririts- NO obvious source for dyspepsia identified.   Marland Kitchen EYE SURGERY       OB History   None      Home Medications    Prior to Admission medications  Medication Sig Start Date End Date Taking? Authorizing Provider  amitriptyline (ELAVIL) 25 MG tablet Take 25 mg by mouth at bedtime as needed for sleep.    Yes [provider]  Azelastine HCl 137 MCG/SPRAY SOLN Place 1-2 sprays into the nose daily as needed (for allergies).  04/30/18  Yes [provider]  cetirizine (ZYRTEC) 10 MG tablet Take 10 mg by mouth daily as needed (for allergies).    Yes [provider]  docusate sodium (STOOL SOFTENER) 100 MG capsule Take 100 mg by mouth 2 (two) times daily as needed for mild constipation or moderate constipation.    Yes [provider]  esomeprazole (NEXIUM) 40 MG capsule Take 40  mg by mouth daily before breakfast.     Yes [provider]  fish oil-omega-3 fatty acids 1000 MG capsule Take 1 g by mouth daily.    Yes [provider]  fluticasone (FLONASE) 50 MCG/ACT nasal spray Place 2 sprays into both nostrils daily.  04/11/13  Yes [provider]  meclizine (ANTIVERT) 25 MG tablet Take 1 tablet (25 mg total) by mouth 3 (three) times daily as needed for dizziness. 09/13/16  Yes Rolland Porter, MD  methylcellulose (ARTIFICIAL TEARS) 1 % ophthalmic solution Place 1 drop into both eyes 2 (two) times daily as needed (dry eyes).   Yes [provider]  mometasone (ELOCON) 0.1 % ointment Apply 1 application topically daily as needed (for irritation).  04/30/18  Yes [provider]  montelukast (SINGULAIR) 10 MG tablet Take 10 mg by mouth daily as needed (for allergies).    Yes [provider]  Multiple Vitamin (MULTIVITAMIN) capsule Take 1 capsule by mouth daily.     Yes [provider]  potassium chloride SA (K-DUR,KLOR-CON) 20 MEQ tablet Take 1 tablet (20 mEq total) by mouth 2 (two) times daily. Patient taking differently: Take 20 mEq by mouth daily.  02/27/17  Yes Kefalas, Manon Hilding, PA-C  traMADol (ULTRAM) 50 MG tablet Take 50 mg by mouth daily as needed for moderate pain.  09/24/13  Yes [provider]    Family History Family History  Problem Relation Age of Onset  . Heart failure Mother   . Diabetes Sister   . Heart attack Father   . Colon cancer Neg Hx     Social History Social History   Tobacco Use  . Smoking status: Former Smoker    Packs/day: 1.00    Years: 25.00    Pack years: 25.00    Types: Cigarettes    Last attempt to quit: 09/14/2007    Years since quitting: 10.7  . Smokeless tobacco: Never Used  Substance Use Topics  . Alcohol use: Yes    Comment: occasional/rare (Brandy every now and then)  . Drug use: No     Allergies   Latex; Sulfa antibiotics; and Sulfonamide  derivatives   Review of Systems Review of Systems  Constitutional: Negative for fever.  Respiratory: Negative for chest tightness and shortness of breath.   Cardiovascular: Negative for chest pain.  Gastrointestinal: Negative for abdominal pain and blood in stool.  Genitourinary: Negative for dysuria.  Neurological: Positive for dizziness. Negative for syncope and headaches.  All other systems reviewed and are negative.    Physical Exam Updated Vital Signs BP 128/85   Pulse 63   Temp 98.9 F (37.2 C) (Oral)   Resp 16   Ht 1.626 m (5\' 4" )   Wt 68.5 kg   SpO2 97%   BMI  25.92 kg/m   Physical Exam  Constitutional: She is oriented to person, place, and time. No distress.  HENT:  Head: Normocephalic and atraumatic.  Right Ear: External ear normal.  Left Ear: External ear normal.  Mouth/Throat: Oropharynx is clear and moist.  Eyes: Conjunctivae are normal. Right eye exhibits no discharge. Left eye exhibits no discharge. No scleral icterus.  Neck: Neck supple. No tracheal deviation present.  Cardiovascular: Normal rate, regular rhythm and intact distal pulses.  Pulmonary/Chest: Effort normal and breath sounds normal. No stridor. No respiratory distress. She has no wheezes. She has no rales.  Abdominal: Soft. Bowel sounds are normal. She exhibits no distension. There is no tenderness. There is no rebound and no guarding.  Musculoskeletal: She exhibits no edema or tenderness.  Neurological: She is alert and oriented to person, place, and time. She has normal strength. No cranial nerve deficit (No facial droop, extraocular movements intact, tongue midline ) or sensory deficit. She exhibits normal muscle tone. She displays no seizure activity. Coordination normal.  No pronator drift bilateral upper extrem, able to hold both legs off bed for 5 seconds, sensation intact in all extremities, no visual field cuts, no left or right sided neglect, normal finger-nose exam bilaterally, no  nystagmus noted   Skin: Skin is warm and dry. No rash noted. She is not diaphoretic.  Psychiatric: She has a normal mood and affect.  Nursing note and vitals reviewed.    ED Treatments / Results  Labs (all labs ordered are listed, but only abnormal results are displayed) Labs Reviewed  CBC - Abnormal; Notable for the following components:      Result Value   Hemoglobin 11.7 (*)    RDW 16.7 (*)    All other components within normal limits  COMPREHENSIVE METABOLIC PANEL - Abnormal; Notable for the following components:   Glucose, Bld 108 (*)    GFR calc non Af Amer 58 (*)    All other components within normal limits  URINALYSIS, ROUTINE W REFLEX MICROSCOPIC - Abnormal; Notable for the following components:   Leukocytes, UA TRACE (*)    All other components within normal limits  I-STAT CHEM 8, ED - Abnormal; Notable for the following components:   Glucose, Bld 106 (*)    Calcium, Ion 1.13 (*)    All other components within normal limits  TROPONIN I  DIFFERENTIAL  TROPONIN I    EKG EKG Interpretation  Date/Time:  Monday June 25 2018 16:06:43 EDT Ventricular Rate:  79 PR Interval:    QRS Duration: 90 QT Interval:  382 QTC Calculation: 438 R Axis:   -9 Text Interpretation:  Sinus rhythm Nonspecific T abnormalities, anterior leads No significant change since last tracing Confirmed by Dorie Rank [phone]) on 06/25/2018 4:29:16 PM   Radiology Dg Chest 2 View  Result Date: 06/25/2018 CLINICAL DATA:  Weak and lightheaded EXAM: CHEST - 2 VIEW COMPARISON:  12/28/2016 FINDINGS: No acute airspace disease or pleural effusion. Stable cardiomediastinal silhouette. No pneumothorax. IMPRESSION: No active cardiopulmonary disease. Electronically Signed   By: Donavan Foil M.D.   On: 06/25/2018 16:38    Procedures Procedures (including critical care time)  Medications Ordered in ED Medications  sodium chloride 0.9 % bolus 500 mL (0 mLs Intravenous Stopped 06/25/18 1724)    And  0.9  %  sodium chloride infusion (125 mL/hr Intravenous New Bag/Given 06/25/18 1826)  sodium chloride 0.9 % bolus 500 mL (0 mLs Intravenous Stopped 06/25/18 1816)     Initial Impression / Assessment  and Plan / ED Course  I have reviewed the triage vital signs and the nursing notes.  Pertinent labs & imaging results that were available during my care of the patient were reviewed by me and considered in my medical decision making (see chart for details).  Clinical Course as of Jun 25 2018  Mon Jun 25, 2018  2017 Labs reviewed.  No clinically significant abnormalities.   [JK]    Clinical Course User Index [JK] Dorie Rank, MD    Patient presented to the emergency room for evaluation of lightheadedness.  Patient describes a sensation of feeling lightheaded but no vertigo or unsteadiness.  Her primary doctor did an EKG in the office and was concerned about the findings.  Patient's EKG here today does show some abnormalities but this is not changed from previous EKGs.  Serial cardiac enzymes are normal.  Not having any chest pain.  Her neurologic exam is reassuring.  She has no focal deficits.  I doubt stroke or TIA.  Patient was monitored in the emergency room.  Her symptoms have improved.  At this time there does not appear to be any evidence of an acute emergency medical condition and the patient appears stable for discharge with appropriate outpatient follow up.   Final Clinical Impressions(s) / ED Diagnoses   Final diagnoses:  [place]    ED Discharge Orders    None       Dorie Rank, MD 06/25/18 2019

## 2018-06-25 NOTE — ED Notes (Signed)
Going to xray 

## 2018-06-25 NOTE — ED Notes (Signed)
Denies dizziness during orthostatics

## 2018-06-25 NOTE — ED Triage Notes (Signed)
Patient sent from [facility] for dizziness and weakness upon awakening at 0715 this morning.

## 2018-08-01 ENCOUNTER — Encounter (INDEPENDENT_AMBULATORY_CARE_PROVIDER_SITE_OTHER): Payer: Medicare Other | Admitting: Ophthalmology

## 2018-08-01 DIAGNOSIS — I10 Essential (primary) hypertension: Secondary | ICD-10-CM | POA: Diagnosis not present

## 2018-08-01 DIAGNOSIS — H35033 Hypertensive retinopathy, bilateral: Secondary | ICD-10-CM | POA: Diagnosis not present

## 2018-08-01 DIAGNOSIS — H43813 Vitreous degeneration, bilateral: Secondary | ICD-10-CM

## 2018-08-01 DIAGNOSIS — H35372 Puckering of macula, left eye: Secondary | ICD-10-CM | POA: Diagnosis not present

## 2018-08-01 DIAGNOSIS — H348322 Tributary (branch) retinal vein occlusion, left eye, stable: Secondary | ICD-10-CM

## 2018-08-03 ENCOUNTER — Emergency Department (HOSPITAL_COMMUNITY)
Admission: EM | Admit: 2018-08-03 | Discharge: 2018-08-03 | Disposition: A | Discharge status: Home / Self Care | Payer: Medicare Other | Attending: Emergency Medicine | Admitting: Emergency Medicine

## 2018-08-03 ENCOUNTER — Other Ambulatory Visit: Payer: Self-pay

## 2018-08-03 ENCOUNTER — Encounter (HOSPITAL_COMMUNITY): Payer: Self-pay | Admitting: *Deleted

## 2018-08-03 ENCOUNTER — Emergency Department (HOSPITAL_COMMUNITY): Payer: Medicare Other

## 2018-08-03 DIAGNOSIS — R531 Weakness: Secondary | ICD-10-CM | POA: Insufficient documentation

## 2018-08-03 DIAGNOSIS — Z87891 Personal history of nicotine dependence: Secondary | ICD-10-CM | POA: Insufficient documentation

## 2018-08-03 DIAGNOSIS — I10 Essential (primary) hypertension: Secondary | ICD-10-CM | POA: Diagnosis not present

## 2018-08-03 DIAGNOSIS — R509 Fever, unspecified: Secondary | ICD-10-CM | POA: Insufficient documentation

## 2018-08-03 DIAGNOSIS — G44209 Tension-type headache, unspecified, not intractable: Secondary | ICD-10-CM | POA: Insufficient documentation

## 2018-08-03 DIAGNOSIS — Z91040 Latex allergy status: Secondary | ICD-10-CM | POA: Diagnosis not present

## 2018-08-03 LAB — CBC WITH DIFFERENTIAL/PLATELET
BASOS PCT: 0 %
Basophils Absolute: 0 10*3/uL (ref 0.0–0.1)
EOS ABS: 0 10*3/uL (ref 0.0–0.7)
EOS PCT: 0 %
HCT: 37.7 % (ref 36.0–46.0)
HEMOGLOBIN: 12.2 g/dL (ref 12.0–15.0)
LYMPHS ABS: 1.1 10*3/uL (ref 0.7–4.0)
Lymphocytes Relative: 10 %
MCH: 30 pg (ref 26.0–34.0)
MCHC: 32.4 g/dL (ref 30.0–36.0)
MCV: 92.9 fL (ref 78.0–100.0)
MONO ABS: 0.7 10*3/uL (ref 0.1–1.0)
MONOS PCT: 7 %
NEUTROS PCT: 83 %
Neutro Abs: 9.5 10*3/uL — ABNORMAL HIGH (ref 1.7–7.7)
PLATELETS: 296 10*3/uL (ref 150–400)
RBC: 4.06 MIL/uL (ref 3.87–5.11)
RDW: 16.5 % — ABNORMAL HIGH (ref 11.5–15.5)
WBC: 11.4 10*3/uL — ABNORMAL HIGH (ref 4.0–10.5)

## 2018-08-03 LAB — URINALYSIS, ROUTINE W REFLEX MICROSCOPIC
Bilirubin Urine: NEGATIVE
Glucose, UA: NEGATIVE mg/dL
HGB URINE DIPSTICK: NEGATIVE
Ketones, ur: NEGATIVE mg/dL
LEUKOCYTES UA: NEGATIVE
Nitrite: NEGATIVE
PROTEIN: NEGATIVE mg/dL
Specific Gravity, Urine: 1.009 (ref 1.005–1.030)
pH: 5 (ref 5.0–8.0)

## 2018-08-03 LAB — BASIC METABOLIC PANEL
Anion gap: 4 — ABNORMAL LOW (ref 5–15)
BUN: 14 mg/dL (ref 8–23)
CHLORIDE: 104 mmol/L (ref 98–111)
CO2: 32 mmol/L (ref 22–32)
CREATININE: 0.91 mg/dL (ref 0.44–1.00)
Calcium: 9.9 mg/dL (ref 8.9–10.3)
GFR calc non Af Amer: 58 mL/min — ABNORMAL LOW (ref >60)
Glucose, Bld: 83 mg/dL (ref 70–99)
Potassium: 4.6 mmol/L (ref 3.5–5.1)
Sodium: 140 mmol/L (ref 135–145)

## 2018-08-03 LAB — I-STAT TROPONIN, ED: TROPONIN I, POC: 0.01 ng/mL (ref 0.00–0.08)

## 2018-08-03 MED ORDER — ACETAMINOPHEN 500 MG PO TABS
1000.0000 mg | ORAL_TABLET | Freq: Once | ORAL | Status: AC
  Administered 2018-08-03: 1000 mg via ORAL
  Filled 2018-08-03: qty 2

## 2018-08-03 MED ORDER — SODIUM CHLORIDE 0.9 % IV BOLUS
500.0000 mL | Freq: Once | INTRAVENOUS | Status: AC
  Administered 2018-08-03: 500 mL via INTRAVENOUS

## 2018-08-03 NOTE — ED Provider Notes (Signed)
Emergency Department Provider Note   I have reviewed the triage vital signs and the nursing notes.   HISTORY  Chief Complaint Fever   HPI Melody Mitchell is a 77 y.o. female with PMH of AVM in the colon, HLD, HTN, and anemia to the emergency department with subjective fever along with lightheadedness and fatigue.  Patient denies any blood in the bowel movements.  She went to her PCP office yesterday for routine appointment and states her temperature was elevated to 99 F.  Blood and urine tests were sent but no cause for the patient's symptoms was found.  She states that this morning she developed a mild to moderate frontal headache without vision changes, light sensitivity, or phonophobia.  No neck pain or stiffness.  No body aches.  Denies any productive cough, runny nose, vomiting, or diarrhea.  Patient is not having any difficulty speaking or swallowing.  No trouble walking.  The patient did take BC powder this AM for HA with no relief.    Past Medical History:  Diagnosis Date  . Acid reflux   . Arthritis   . 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 Bronchitis january 2014  . Hiatal hernia   . High cholesterol   . History of contact dermatitis   . Hx: recurrent pneumonia   . Hypertension   . IDA (iron deficiency anemia)    chronic iron infusions  . Reflux   . Vertigo     Patient Active Problem List   Diagnosis Date Noted  . Abnormal CT of the abdomen 02/02/2017  . Rectal bleeding 02/02/2017  . Aortic atherosclerosis ([place]) 01/03/2017  . Colitis 01/02/2017  . Hypokalemia 01/02/2017  . Heme positive stool 06/10/2015  . Change in bowel habits 06/10/2015  . Abdominal pain, epigastric 06/10/2015  . High cholesterol   . Acid reflux   . GERD 01/03/2011  . HEMORRHOIDS 04/15/2010  . Iron deficiency anemia due to chronic blood loss 01/12/2010  . 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 ABDOMINAL HYSTERECTOMY    . APPENDECTOMY    . BILATERAL BREAST BIOPSIES  BENIGN    . CHOLECYSTECTOMY    . COLONOSCOPY N/A 06/29/2015   SLF: The left colon is redundant 2. four colon polyps removed. No source  for change in bowel habits identified 3. Rectal bleeding due to large internal hemorrhoids   . COLONOSCOPY W/ BIOPSIES  6 2010   Dr. Oneida Alar: Polypoid appearing lesion of the ascending colon, 3 mm sessile rectal polyp, small internal hemorrhoids. Pathology revealed polypoid mucosa, no adenomatous changes  . Double-balloon enteroscopy, antegrade  April 2011   Dr. 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  Marland Kitchen ESOPHAGOGASTRODUODENOSCOPY  04/2009   Dr. Oneida Alar: Patent Schatzki ring dilated with advancing the scope, patchy erythema in the antrum, 2 small AVMs in the duodenal bulb, 2 additional AVMs noted in the second portion the duodenum, mild gastritis on pathology  . ESOPHAGOGASTRODUODENOSCOPY N/A 06/29/2015   SLF: 1. 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 small hiatal hernia 3. Mild non-erosive gastririts- NO obvious source for dyspepsia identified.   Marland Kitchen EYE SURGERY      Allergies Latex; Sulfa antibiotics; and Sulfonamide derivatives  Family History  Problem Relation Age of Onset  . Heart failure Mother   . Diabetes Sister   . Heart attack Father   . Colon cancer Neg Hx  Social History Social History   Tobacco Use  . Smoking status: Former Smoker    Packs/day: 1.00    Years: 25.00    Pack years: 25.00    Types: Cigarettes    Last attempt to quit: 09/14/2007    Years since quitting: 10.8  . Smokeless tobacco: Never Used  Substance Use Topics  . Alcohol use: Yes    Comment: occasional/rare (Brandy every now and then)  . Drug use: No    Review of Systems  Constitutional: Positive subjective fever. No chills. Positive lightheadedness.  Eyes: No visual changes. ENT: No sore throat. Cardiovascular: Denies chest  pain. Respiratory: Denies shortness of breath. Gastrointestinal: No abdominal pain.  No nausea, no vomiting.  No diarrhea.  No constipation. Genitourinary: Negative for dysuria. Musculoskeletal: Negative for back pain. Skin: Negative for rash. Neurological: Negative for focal weakness or numbness. Positive HA.   10-point ROS otherwise negative.  ____________________________________________   PHYSICAL EXAM:  VITAL SIGNS: ED Triage Vitals  Enc Vitals Group     BP 08/03/18 0954 (!) 154/99     Pulse Rate 08/03/18 0954 78     Resp 08/03/18 0954 11     Temp 08/03/18 0954 99.5 F (37.5 C)     Temp Source 08/03/18 0954 Oral     SpO2 08/03/18 0954 100 %     Weight 08/03/18 0952 151 lb (68.5 kg)     Height 08/03/18 0952 5\' 4"  (1.626 m)     Pain Score 08/03/18 0952 0   Constitutional: Alert and oriented. Well appearing and in no acute distress. Eyes: Conjunctivae are normal. PERRL. Head: Atraumatic. Nose: No congestion/rhinnorhea. Mouth/Throat: Mucous membranes are moist. Neck: No stridor.  No meningeal signs.   Cardiovascular: Normal rate, regular rhythm. Good peripheral circulation. Grossly normal heart sounds.   Respiratory: Normal respiratory effort.  No retractions. Lungs CTAB. Gastrointestinal: Soft and nontender. No distention.  Musculoskeletal: No lower extremity tenderness nor edema. No gross deformities of extremities. Neurologic:  Normal speech and language. No gross focal neurologic deficits are appreciated.  Skin:  Skin is warm, dry and intact. No rash noted.  ____________________________________________   LABS (all labs ordered are listed, but only abnormal results are displayed)  Labs Reviewed  BASIC METABOLIC PANEL - Abnormal; Notable for the following components:      Result Value   GFR calc non Af Amer 58 (*)    Anion gap 4 (*)    All other components within normal limits  CBC WITH DIFFERENTIAL/PLATELET - Abnormal; Notable for the following components:    WBC 11.4 (*)    RDW 16.5 (*)    Neutro Abs 9.5 (*)    All other components within normal limits  URINALYSIS, ROUTINE W REFLEX MICROSCOPIC  I-STAT TROPONIN, ED   ____________________________________________  EKG   EKG Interpretation  Date/Time:  Friday August 03 2018 10:01:55 EDT Ventricular Rate:  66 PR Interval:    QRS Duration: 88 QT Interval:  378 QTC Calculation: 396 R Axis:   0 Text Interpretation:  Sinus arrhythmia Abnormal R-wave progression, early transition Borderline T abnormalities, anterior leads No STEMI.  Confirmed by Nanda Quinton [phone]) on 08/03/2018 10:10:55 AM       ____________________________________________  RADIOLOGY  Dg Chest 2 View  Result Date: 08/03/2018 CLINICAL DATA:  Dizziness/fever since yesterday/htn/ex smoker EXAM: CHEST - 2 VIEW COMPARISON:  06/25/2018 FINDINGS: Somewhat coarse pulmonary interstitial markings without focal infiltrate or overt edema. Heart size normal.  Tortuous ectatic aorta. No effusion. Visualized bones unremarkable.  Cholecystectomy clips. IMPRESSION: No acute cardiopulmonary disease. Electronically Signed   By: Lucrezia Europe M.D.   On: 08/03/2018 10:41    ____________________________________________   PROCEDURES  Procedure(s) performed:   Procedures  None ____________________________________________   INITIAL IMPRESSION / ASSESSMENT AND PLAN / ED COURSE  Pertinent labs & imaging results that were available during my care of the patient were reviewed by me and considered in my medical decision making (see chart for details).  Patient presents to the emergency department with elevated temperature and lightheadedness with frontal headache.  The patient's headache seems most consistent with tension headache.  No concern clinically for meningitis.  Patient is afebrile here and has had a T-max of 39 F in the last 24 hours.  It is soft and nontender.  Nonfocal neurological exam.  Plan for screening labs, chest x-ray, UA  to investigate possible source of subjective fever and assess for anemia/electrolyte abnormality that may be contributing to weakness.   11:20 AM Patient remains afebrile here.  Feeling better after IV fluids and Tylenol.  Labs reviewed with no acute findings other than mild leukocytosis.  No source for developing infection.  Possible viral etiology.  No antibiotics at this time.  Patient is ambulatory here without difficulty or gait abnormality.  Strongly low suspicion for acute neurological process.  Plan for discharge home with supportive care and PCP follow-up in the coming week.   At this time, I do not feel there is any life-threatening condition present. I have reviewed and discussed all results (EKG, imaging, lab, urine as appropriate), exam findings with patient. I have reviewed nursing notes and appropriate previous records.  I feel the patient is safe to be discharged home without further emergent workup. Discussed usual and customary return precautions. Patient and family (if present) verbalize understanding and are comfortable with this plan.  Patient will follow-up with their primary care provider. If they do not have a primary care provider, information for follow-up has been provided to them. All questions have been answered.  ____________________________________________  FINAL CLINICAL IMPRESSION(S) / ED DIAGNOSES  Final diagnoses:  Generalized weakness  Acute non intractable tension-type headache     MEDICATIONS GIVEN DURING THIS VISIT:  Medications  sodium chloride 0.9 % bolus 500 mL (0 mLs Intravenous Stopped 08/03/18 1121)  acetaminophen (TYLENOL) tablet 1,000 mg (1,000 mg Oral Given 08/03/18 1020)    Note:  This document was prepared using Dragon voice recognition software and may include unintentional dictation errors.  Nanda Quinton, MD Emergency Medicine    Long, Wonda [facility], MD 08/03/18 [phone]

## 2018-08-03 NOTE — Discharge Instructions (Signed)
You have been seen in the Emergency Department (ED) for a headache.  Please use Tylenol as needed for symptoms, but only as written on the box.  As we have discussed, please follow up with your primary care doctor as soon as possible regarding today's Emergency Department (ED) visit and your headache symptoms.    Call your doctor or return to the ED if you have a worsening headache, sudden and severe headache, confusion, slurred speech, facial droop, weakness or numbness in any arm or leg, extreme fatigue, vision problems, or other symptoms that concern you.  

## 2018-08-03 NOTE — ED Triage Notes (Signed)
Pt reports she had a fever and dizziness when ambulating that started yesterday. Pt reports she saw her PCP yesterday and they did blood work and took a urine sample but they didn't find any reason for her fever.

## 2018-10-24 ENCOUNTER — Encounter (INDEPENDENT_AMBULATORY_CARE_PROVIDER_SITE_OTHER): Payer: Medicare Other | Admitting: Ophthalmology

## 2018-11-23 ENCOUNTER — Inpatient Hospital Stay (HOSPITAL_COMMUNITY): Payer: Medicare Other | Attending: Hematology

## 2018-11-23 DIAGNOSIS — K449 Diaphragmatic hernia without obstruction or gangrene: Secondary | ICD-10-CM | POA: Insufficient documentation

## 2018-11-23 DIAGNOSIS — Z79899 Other long term (current) drug therapy: Secondary | ICD-10-CM | POA: Diagnosis not present

## 2018-11-23 DIAGNOSIS — K219 Gastro-esophageal reflux disease without esophagitis: Secondary | ICD-10-CM | POA: Insufficient documentation

## 2018-11-23 DIAGNOSIS — E7800 Pure hypercholesterolemia, unspecified: Secondary | ICD-10-CM | POA: Insufficient documentation

## 2018-11-23 DIAGNOSIS — K5520 Angiodysplasia of colon without hemorrhage: Secondary | ICD-10-CM | POA: Insufficient documentation

## 2018-11-23 DIAGNOSIS — R5383 Other fatigue: Secondary | ICD-10-CM | POA: Insufficient documentation

## 2018-11-23 DIAGNOSIS — M129 Arthropathy, unspecified: Secondary | ICD-10-CM | POA: Diagnosis not present

## 2018-11-23 DIAGNOSIS — Z87891 Personal history of nicotine dependence: Secondary | ICD-10-CM | POA: Insufficient documentation

## 2018-11-23 DIAGNOSIS — D500 Iron deficiency anemia secondary to blood loss (chronic): Secondary | ICD-10-CM | POA: Diagnosis present

## 2018-11-23 DIAGNOSIS — I10 Essential (primary) hypertension: Secondary | ICD-10-CM | POA: Insufficient documentation

## 2018-11-23 LAB — CBC WITH DIFFERENTIAL/PLATELET
Abs Immature Granulocytes: 0 10*3/uL (ref 0.00–0.07)
Basophils Absolute: 0 10*3/uL (ref 0.0–0.1)
Basophils Relative: 0 %
EOS ABS: 0.1 10*3/uL (ref 0.0–0.5)
EOS PCT: 2 %
HEMATOCRIT: 36 % (ref 36.0–46.0)
HEMOGLOBIN: 11 g/dL — AB (ref 12.0–15.0)
Immature Granulocytes: 0 %
LYMPHS ABS: 1.3 10*3/uL (ref 0.7–4.0)
LYMPHS PCT: 31 %
MCH: 29.3 pg (ref 26.0–34.0)
MCHC: 30.6 g/dL (ref 30.0–36.0)
MCV: 96 fL (ref 80.0–100.0)
MONO ABS: 0.5 10*3/uL (ref 0.1–1.0)
MONOS PCT: 12 %
Neutro Abs: 2.3 10*3/uL (ref 1.7–7.7)
Neutrophils Relative %: 55 %
Platelets: 287 10*3/uL (ref 150–400)
RBC: 3.75 MIL/uL — ABNORMAL LOW (ref 3.87–5.11)
RDW: 13 % (ref 11.5–15.5)
WBC: 4.2 10*3/uL (ref 4.0–10.5)
nRBC: 0 % (ref 0.0–0.2)

## 2018-11-23 LAB — COMPREHENSIVE METABOLIC PANEL
ALK PHOS: 60 U/L (ref 38–126)
ALT: 15 U/L (ref 0–44)
ANION GAP: 4 — AB (ref 5–15)
AST: 27 U/L (ref 15–41)
Albumin: 3.8 g/dL (ref 3.5–5.0)
BILIRUBIN TOTAL: 0.5 mg/dL (ref 0.3–1.2)
BUN: 14 mg/dL (ref 8–23)
CALCIUM: 8.9 mg/dL (ref 8.9–10.3)
CO2: 26 mmol/L (ref 22–32)
Chloride: 109 mmol/L (ref 98–111)
Creatinine, Ser: 0.91 mg/dL (ref 0.44–1.00)
GFR calc Af Amer: >60 mL/min (ref >60)
GFR, EST NON AFRICAN AMERICAN: 60 mL/min — AB (ref >60)
GLUCOSE: 91 mg/dL (ref 70–99)
POTASSIUM: 4.3 mmol/L (ref 3.5–5.1)
Sodium: 139 mmol/L (ref 135–145)
TOTAL PROTEIN: 7.1 g/dL (ref 6.5–8.1)

## 2018-11-23 LAB — IRON AND TIBC
IRON: 48 ug/dL (ref 28–170)
SATURATION RATIOS: 15 % (ref 10.4–31.8)
TIBC: 313 ug/dL (ref 250–450)
UIBC: 265 ug/dL

## 2018-11-23 LAB — FERRITIN: Ferritin: 26 ng/mL (ref 11–307)

## 2018-11-30 ENCOUNTER — Encounter (HOSPITAL_COMMUNITY): Payer: Self-pay | Admitting: Hematology

## 2018-11-30 ENCOUNTER — Inpatient Hospital Stay (HOSPITAL_BASED_OUTPATIENT_CLINIC_OR_DEPARTMENT_OTHER): Payer: Medicare Other | Admitting: Hematology

## 2018-11-30 ENCOUNTER — Other Ambulatory Visit: Payer: Self-pay

## 2018-11-30 VITALS — BP 114/76 | HR 59 | Temp 98.9°F | Resp 16 | Wt 150.2 lb

## 2018-11-30 DIAGNOSIS — Z79899 Other long term (current) drug therapy: Secondary | ICD-10-CM

## 2018-11-30 DIAGNOSIS — R5383 Other fatigue: Secondary | ICD-10-CM | POA: Diagnosis not present

## 2018-11-30 DIAGNOSIS — E7800 Pure hypercholesterolemia, unspecified: Secondary | ICD-10-CM

## 2018-11-30 DIAGNOSIS — D500 Iron deficiency anemia secondary to blood loss (chronic): Secondary | ICD-10-CM | POA: Diagnosis not present

## 2018-11-30 DIAGNOSIS — I10 Essential (primary) hypertension: Secondary | ICD-10-CM

## 2018-11-30 DIAGNOSIS — K5520 Angiodysplasia of colon without hemorrhage: Secondary | ICD-10-CM | POA: Diagnosis not present

## 2018-11-30 DIAGNOSIS — Z87891 Personal history of nicotine dependence: Secondary | ICD-10-CM

## 2018-11-30 DIAGNOSIS — K219 Gastro-esophageal reflux disease without esophagitis: Secondary | ICD-10-CM

## 2018-11-30 DIAGNOSIS — K449 Diaphragmatic hernia without obstruction or gangrene: Secondary | ICD-10-CM

## 2018-11-30 DIAGNOSIS — M129 Arthropathy, unspecified: Secondary | ICD-10-CM

## 2018-11-30 NOTE — Patient Instructions (Signed)
[facility] at [facility] Discharge Instructions     Thank you for choosing [facility] at [facility] to provide your oncology and hematology care.  To afford each patient quality time with our provider, please arrive at least 15 minutes before your scheduled appointment time.   If you have a lab appointment with the Cancer Center please come in thru the  Main Entrance and check in at the main information desk  You need to re-schedule your appointment should you arrive 10 or more minutes late.  We strive to give you quality time with our providers, and arriving late affects you and other patients whose appointments are after yours.  Also, if you no show three or more times for appointments you may be dismissed from the clinic at the providers discretion.     Again, thank you for choosing [facility].  Our hope is that these requests will decrease the amount of time that you wait before being seen by our physicians.       _____________________________________________________________  Should you have questions after your visit to [facility], please contact our office at [phone] between the hours of 8:00 a.m. and 4:30 p.m.  Voicemails left after 4:00 p.m. will not be returned until the following business day.  For prescription refill requests, have your pharmacy contact our office and allow 72 hours.    Cancer Center Support Programs:   > Cancer Support Group  2nd Tuesday of the month 1pm-2pm, Journey Room    

## 2018-11-30 NOTE — Progress Notes (Signed)
Melody Mitchell, [place] [address]   CLINIC:  Medical Oncology/Hematology  PCP:  [name], MD [address] [place] [address] [phone]   REASON FOR VISIT: Follow-up for iron defieiceny anemia due to chronic blood loss AVMs  CURRENT THERAPY: feraheme Infusions  INTERVAL HISTORY:  Melody Mitchell 78 y.o. female returns for routine follow-up for iron deficiency anemia. She is here today and doing well. She does get a little fatigued during the afternoons. Denies any nausea, vomiting, or diarrhea. Denies any new pains. Had not noticed any recent bleeding such as epistaxis, hematuria or hematochezia. Denies recent chest pain on exertion, shortness of breath on minimal exertion, pre-syncopal episodes, or palpitations. Denies any numbness or tingling in hands or feet. Denies any recent fevers, infections, or recent hospitalizations. Patient reports appetite at 100% and energy level at 100%.    REVIEW OF SYSTEMS:  Review of Systems  Constitutional: Positive for fatigue.  All other systems reviewed and are negative.    PAST MEDICAL/SURGICAL HISTORY:  Past Medical History:  Diagnosis Date  . Acid reflux   . Arthritis   . 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 Bronchitis january 2014  . Hiatal hernia   . High cholesterol   . History of contact dermatitis   . Hx: recurrent pneumonia   . Hypertension   . IDA (iron deficiency anemia)    chronic iron infusions  . Reflux   . Vertigo    Past Surgical History:  Procedure Laterality Date  . ABDOMINAL HYSTERECTOMY    . APPENDECTOMY    . BILATERAL BREAST BIOPSIES  BENIGN    . CHOLECYSTECTOMY    . COLONOSCOPY N/A 06/29/2015   SLF: The left colon is redundant 2. four colon polyps removed. No source  for change in bowel habits identified 3. Rectal bleeding due to large internal hemorrhoids   . COLONOSCOPY W/ BIOPSIES  6 2010   Dr. Oneida Alar: Polypoid appearing lesion  of the ascending colon, 3 mm sessile rectal polyp, small internal hemorrhoids. Pathology revealed polypoid mucosa, no adenomatous changes  . Double-balloon enteroscopy, antegrade  April 2011   Dr. 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  Marland Kitchen ESOPHAGOGASTRODUODENOSCOPY  04/2009   Dr. Oneida Alar: Patent Schatzki ring dilated with advancing the scope, patchy erythema in the antrum, 2 small AVMs in the duodenal bulb, 2 additional AVMs noted in the second portion the duodenum, mild gastritis on pathology  . ESOPHAGOGASTRODUODENOSCOPY N/A 06/29/2015   SLF: 1. 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 small hiatal hernia 3. Mild non-erosive gastririts- NO obvious source for dyspepsia identified.   Marland Kitchen EYE SURGERY       SOCIAL HISTORY:  Social History   Socioeconomic History  . Marital status: Divorced    Spouse name: Not on file  . Number of children: 1  . Years of education: Not on file  . Highest education level: Not on file  Occupational History  . Not on file  Social Needs  . Financial resource strain: Not on file  . Food insecurity:    Worry: Not on file    Inability: Not on file  . Transportation needs:    Medical: Not on file    Non-medical: Not on file  Tobacco Use  . Smoking status: Former Smoker    Packs/day: 1.00    Years: 25.00    Pack years: 25.00    Types: Cigarettes  Last attempt to quit: 09/14/2007    Years since quitting: 11.2  . Smokeless tobacco: Never Used  Substance and Sexual Activity  . Alcohol use: Yes    Comment: occasional/rare (Brandy every now and then)  . Drug use: No  . Sexual activity: Yes    Birth control/protection: Post-menopausal, Surgical  Lifestyle  . Physical activity:    Days per week: Not on file    Minutes per session: Not on file  . Stress: Not on file  Relationships  . Social connections:    Talks on phone: Not on file    Gets together: Not on file    Attends religious service: Not on file     Active member of club or organization: Not on file    Attends meetings of clubs or organizations: Not on file    Relationship status: Not on file  . Intimate partner violence:    Fear of current or ex partner: Not on file    Emotionally abused: Not on file    Physically abused: Not on file    Forced sexual activity: Not on file  Other Topics Concern  . Not on file  Social History Narrative  . Not on file    FAMILY HISTORY:  Family History  Problem Relation Age of Onset  . Heart failure Mother   . Diabetes Sister   . Heart attack Father   . Colon cancer Neg Hx     CURRENT MEDICATIONS:  Outpatient Encounter Medications as of 11/30/2018  Medication Sig  . amitriptyline (ELAVIL) 25 MG tablet Take 25 mg by mouth at bedtime as needed for sleep.   Marland Kitchen esomeprazole (NEXIUM) 40 MG capsule Take 40 mg by mouth daily before breakfast.    . fish oil-omega-3 fatty acids 1000 MG capsule Take 1 g by mouth daily.   . fluticasone (FLONASE) 50 MCG/ACT nasal spray Place 2 sprays into both nostrils daily.   . meclizine (ANTIVERT) 25 MG tablet Take 1 tablet (25 mg total) by mouth 3 (three) times daily as needed for dizziness.  . methylcellulose (ARTIFICIAL TEARS) 1 % ophthalmic solution Place 1 drop into both eyes 2 (two) times daily as needed (dry eyes).  . mometasone (ELOCON) 0.1 % ointment Apply 1 application topically daily as needed (for irritation).   . Multiple Vitamin (MULTIVITAMIN) capsule Take 1 capsule by mouth daily.    . potassium chloride SA (K-DUR,KLOR-CON) 20 MEQ tablet Take 1 tablet (20 mEq total) by mouth 2 (two) times daily. (Patient taking differently: Take 20 mEq by mouth daily. )  . [DISCONTINUED] Azelastine HCl 137 MCG/SPRAY SOLN Place 1-2 sprays into the nose daily as needed (for allergies).   . COMBIGAN 0.2-0.5 % ophthalmic solution   . docusate sodium (STOOL SOFTENER) 100 MG capsule Take 100 mg by mouth 2 (two) times daily as needed for mild constipation or moderate  constipation.   . montelukast (SINGULAIR) 10 MG tablet Take 10 mg by mouth daily as needed (for allergies).   . traMADol (ULTRAM) 50 MG tablet Take 50 mg by mouth daily as needed for moderate pain.   . [DISCONTINUED] cetirizine (ZYRTEC) 10 MG tablet Take 10 mg by mouth daily as needed (for allergies).    No facility-administered encounter medications on file as of 11/30/2018.     ALLERGIES:  Allergies  Allergen Reactions  . Latex Hives, Itching and Rash  . Sulfa Antibiotics Hives and Itching  . Sulfonamide Derivatives Hives and Itching     PHYSICAL EXAM:  ECOG Performance status: 1  Vitals:   11/30/18 1000  BP: 114/76  Pulse: (!) 59  Resp: 16  Temp: 98.9 F (37.2 C)  SpO2: 100%   Filed Weights   11/30/18 1000  Weight: 150 lb 4 oz (68.2 kg)    Physical Exam Constitutional:      Appearance: Normal appearance. She is normal weight.  Musculoskeletal: Normal range of motion.  Skin:    General: Skin is warm and dry.  Neurological:     Mental Status: She is alert and oriented to person, place, and time. Mental status is at baseline.  Psychiatric:        Mood and Affect: Mood normal.        Behavior: Behavior normal.        Thought Content: Thought content normal.        Judgment: Judgment normal.      LABORATORY DATA:  I have reviewed the labs as listed.  CBC    Component Value Date/Time   WBC 4.2 11/23/2018 0945   RBC 3.75 (L) 11/23/2018 0945   HGB 11.0 (L) 11/23/2018 0945   HCT 36.0 11/23/2018 0945   PLT 287 11/23/2018 0945   MCV 96.0 11/23/2018 0945   MCH 29.3 11/23/2018 0945   MCHC 30.6 11/23/2018 0945   RDW 13.0 11/23/2018 0945   LYMPHSABS 1.3 11/23/2018 0945   MONOABS 0.5 11/23/2018 0945   EOSABS 0.1 11/23/2018 0945   BASOSABS 0.0 11/23/2018 0945   CMP Latest Ref Rng & Units 11/23/2018 08/03/2018 06/25/2018  Glucose 70 - 99 mg/dL 91 83 106(H)  BUN 8 - 23 mg/dL 14 14 10   Creatinine 0.44 - 1.00 mg/dL 0.91 0.91 1.00  Sodium 135 - 145 mmol/L 139 140  140  Potassium 3.5 - 5.1 mmol/L 4.3 4.6 3.8  Chloride 98 - 111 mmol/L 109 104 105  CO2 22 - 32 mmol/L 26 32 -  Calcium 8.9 - 10.3 mg/dL 8.9 9.9 -  Total Protein 6.5 - 8.1 g/dL 7.1 - -  Total Bilirubin 0.3 - 1.2 mg/dL 0.5 - -  Alkaline Phos 38 - 126 U/L 60 - -  AST 15 - 41 U/L 27 - -  ALT 0 - 44 U/L 15 - -       DIAGNOSTIC IMAGING:  I have independently reviewed the scans and discussed with the patient.   I have reviewed Francene Finders, NP's note and agree with the documentation.  I personally performed a face-to-face visit, made revisions and my assessment and plan is as follows.    ASSESSMENT & PLAN:   Iron deficiency anemia due to chronic blood loss 1.  Iron deficiency anemia: -This is from small bowel AVMs, as evidenced by EGD in 2011.  Repeat EGD and colonoscopy in 2016 did not show any evidence of active bleeding areas.  Large internal hemorrhoids were seen. - Denies any bleeding per rectum but has occasional dark stools. -Last [name] infusion on 06/07/2018 and 06/15/2018. - We reviewed her blood work today.  Hemoglobin dropped to 11.  Ferritin was 26. -I have recommended 2 more weekly infusions of [name]. -We will see her back in 4 months for follow-up with repeat blood counts.      Orders placed this encounter:  Orders Placed This Encounter  Procedures  . CBC with Differential/Platelet  . Comprehensive metabolic panel  . Ferritin  . Iron and TIBC  . Vitamin B12  . Folate      Derek Jack, MD [facility]  [phone]  

## 2018-11-30 NOTE — Assessment & Plan Note (Signed)
1.  Iron deficiency anemia: -This is from small bowel AVMs, as evidenced by EGD in 2011.  Repeat EGD and colonoscopy in 2016 did not show any evidence of active bleeding areas.  Large internal hemorrhoids were seen. - Denies any bleeding per rectum but has occasional dark stools. -Last [name] infusion on 06/07/2018 and 06/15/2018. - We reviewed her blood work today.  Hemoglobin dropped to 11.  Ferritin was 26. -I have recommended 2 more weekly infusions of [name]. -We will see her back in 4 months for follow-up with repeat blood counts.

## 2018-12-05 ENCOUNTER — Inpatient Hospital Stay (HOSPITAL_COMMUNITY): Payer: Medicare Other

## 2018-12-05 ENCOUNTER — Encounter (HOSPITAL_COMMUNITY): Payer: Self-pay

## 2018-12-05 VITALS — BP 130/73 | HR 68 | Temp 98.7°F | Resp 18 | Wt 149.8 lb

## 2018-12-05 DIAGNOSIS — D500 Iron deficiency anemia secondary to blood loss (chronic): Secondary | ICD-10-CM | POA: Diagnosis not present

## 2018-12-05 MED ORDER — SODIUM CHLORIDE 0.9% FLUSH
10.0000 mL | INTRAVENOUS | Status: DC | PRN
  Administered 2018-12-05: 10 mL
  Filled 2018-12-05: qty 10

## 2018-12-05 MED ORDER — SODIUM CHLORIDE 0.9 % IV SOLN
510.0000 mg | Freq: Once | INTRAVENOUS | Status: AC
  Administered 2018-12-05: 510 mg via INTRAVENOUS
  Filled 2018-12-05: qty 17

## 2018-12-05 MED ORDER — SODIUM CHLORIDE 0.9 % IV SOLN
INTRAVENOUS | Status: DC
  Administered 2018-12-05: 08:00:00 via INTRAVENOUS

## 2018-12-05 NOTE — Progress Notes (Signed)
Patient tolerated iron infusion with no complaints voiced.  Good blood return noted before and after treatment with peripheral site.  Band aid applied.  VSS with discharge and left ambulatory with family with no s/s of distress noted.

## 2018-12-05 NOTE — Patient Instructions (Signed)
[facility]  Discharge Instructions:   _______________________________________________________________  Thank you for choosing [facility] at [facility] to provide your oncology and hematology care.  To afford each patient quality time with our providers, please arrive at least 15 minutes before your scheduled appointment.  You need to re-schedule your appointment if you arrive 10 or more minutes late.  We strive to give you quality time with our providers, and arriving late affects you and other patients whose appointments are after yours.  Also, if you no show three or more times for appointments you may be dismissed from the clinic.  Again, thank you for choosing [facility]. Our hope is that these requests will allow you access to exceptional care and in a timely manner. _______________________________________________________________  If you have questions after your visit, please contact our office at [phone] between the hours of 8:30 a.m. and 5:00 p.m. Voicemails left after 4:30 p.m. will not be returned until the following business day. _______________________________________________________________  For prescription refill requests, have your pharmacy contact our office. _______________________________________________________________  Recommendations made by the consultant and any test results will be sent to your referring physician. _______________________________________________________________ 

## 2018-12-12 ENCOUNTER — Encounter (HOSPITAL_COMMUNITY): Payer: Self-pay

## 2018-12-12 ENCOUNTER — Inpatient Hospital Stay (HOSPITAL_COMMUNITY): Payer: Medicare Other

## 2018-12-12 ENCOUNTER — Other Ambulatory Visit: Payer: Self-pay

## 2018-12-12 VITALS — BP 109/62 | HR 60 | Temp 98.4°F | Resp 18

## 2018-12-12 DIAGNOSIS — D500 Iron deficiency anemia secondary to blood loss (chronic): Secondary | ICD-10-CM | POA: Diagnosis not present

## 2018-12-12 MED ORDER — SODIUM CHLORIDE 0.9 % IV SOLN
510.0000 mg | Freq: Once | INTRAVENOUS | Status: AC
  Administered 2018-12-12: 510 mg via INTRAVENOUS
  Filled 2018-12-12: qty 510

## 2018-12-12 MED ORDER — SODIUM CHLORIDE 0.9 % IV SOLN
INTRAVENOUS | Status: DC
  Administered 2018-12-12: 13:00:00 via INTRAVENOUS

## 2018-12-12 NOTE — Progress Notes (Signed)
Tolerated infusion w/o adverse reaction.  Alert, in no distress.  VSS.  Discharged ambulatory in c/o family.  

## 2018-12-25 ENCOUNTER — Emergency Department (HOSPITAL_COMMUNITY)
Admission: EM | Admit: 2018-12-25 | Discharge: 2018-12-25 | Disposition: A | Discharge status: Home / Self Care | Payer: Medicare Other | Attending: Emergency Medicine | Admitting: Emergency Medicine

## 2018-12-25 ENCOUNTER — Other Ambulatory Visit: Payer: Self-pay

## 2018-12-25 ENCOUNTER — Encounter (HOSPITAL_COMMUNITY): Payer: Self-pay | Admitting: Emergency Medicine

## 2018-12-25 DIAGNOSIS — M25561 Pain in right knee: Secondary | ICD-10-CM | POA: Diagnosis not present

## 2018-12-25 DIAGNOSIS — Z79899 Other long term (current) drug therapy: Secondary | ICD-10-CM | POA: Insufficient documentation

## 2018-12-25 DIAGNOSIS — Z91040 Latex allergy status: Secondary | ICD-10-CM | POA: Diagnosis not present

## 2018-12-25 DIAGNOSIS — I10 Essential (primary) hypertension: Secondary | ICD-10-CM | POA: Insufficient documentation

## 2018-12-25 DIAGNOSIS — Z87891 Personal history of nicotine dependence: Secondary | ICD-10-CM | POA: Diagnosis not present

## 2018-12-25 MED ORDER — KETOROLAC TROMETHAMINE 30 MG/ML IJ SOLN
15.0000 mg | Freq: Once | INTRAMUSCULAR | Status: AC
  Administered 2018-12-25: 15 mg via INTRAVENOUS
  Filled 2018-12-25: qty 1

## 2018-12-25 MED ORDER — ONDANSETRON HCL 4 MG/2ML IJ SOLN
4.0000 mg | Freq: Once | INTRAMUSCULAR | Status: AC
  Administered 2018-12-25: 4 mg via INTRAVENOUS
  Filled 2018-12-25: qty 2

## 2018-12-25 MED ORDER — MORPHINE SULFATE (PF) 4 MG/ML IV SOLN
4.0000 mg | Freq: Once | INTRAVENOUS | Status: AC
  Administered 2018-12-25: 4 mg via INTRAVENOUS
  Filled 2018-12-25: qty 1

## 2018-12-25 NOTE — ED Provider Notes (Signed)
[facility] EMERGENCY DEPARTMENT Provider Note   CSN: [id number] Arrival date & time: 12/25/18  1943     History   Chief Complaint Chief Complaint  Patient presents with  . Knee Pain    HPI Melody Mitchell is a 78 y.o. female.  HPI Patient presents to the emergency room for evaluation of knee pain following an orthopedic injection today.  Patient states she went to see her doctor today.  Nursing notes indicate it was her primary doctor but the patient tells me it was her orthopedic doctor.  Patient states she was told she had severe arthritis in her knee.  She had x-rays in the office today.  She had an injection into the knee joint.  Patient does not know what was in the injection.  She states the pain has increased throughout the day.  She has not taken anything for it.  It hurts for her to stand or even move the knee at all.  He denies any fevers or chills.  No falls. Past Medical History:  Diagnosis Date  . Acid reflux   . Arthritis   . 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 Bronchitis january 2014  . Hiatal hernia   . High cholesterol   . History of contact dermatitis   . Hx: recurrent pneumonia   . Hypertension   . IDA (iron deficiency anemia)    chronic iron infusions  . Reflux   . Vertigo     Patient Active Problem List   Diagnosis Date Noted  . Abnormal CT of the abdomen 02/02/2017  . Rectal bleeding 02/02/2017  . Aortic atherosclerosis ([place]) 01/03/2017  . Colitis 01/02/2017  . Hypokalemia 01/02/2017  . Heme positive stool 06/10/2015  . Change in bowel habits 06/10/2015  . Abdominal pain, epigastric 06/10/2015  . High cholesterol   . Acid reflux   . GERD 01/03/2011  . HEMORRHOIDS 04/15/2010  . Iron deficiency anemia due to chronic blood loss 01/12/2010  . 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 ABDOMINAL HYSTERECTOMY    . APPENDECTOMY    .  BILATERAL BREAST BIOPSIES  BENIGN    . CHOLECYSTECTOMY    . COLONOSCOPY N/A 06/29/2015   SLF: The left colon is redundant 2. four colon polyps removed. No source  for change in bowel habits identified 3. Rectal bleeding due to large internal hemorrhoids   . COLONOSCOPY W/ BIOPSIES  6 2010   Dr. Oneida Alar: Polypoid appearing lesion of the ascending colon, 3 mm sessile rectal polyp, small internal hemorrhoids. Pathology revealed polypoid mucosa, no adenomatous changes  . Double-balloon enteroscopy, antegrade  April 2011   Dr. 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  Marland Kitchen ESOPHAGOGASTRODUODENOSCOPY  04/2009   Dr. Oneida Alar: Patent Schatzki ring dilated with advancing the scope, patchy erythema in the antrum, 2 small AVMs in the duodenal bulb, 2 additional AVMs noted in the second portion the duodenum, mild gastritis on pathology  . ESOPHAGOGASTRODUODENOSCOPY N/A 06/29/2015   SLF: 1. 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 small hiatal hernia 3. Mild non-erosive gastririts- NO obvious source for dyspepsia identified.   Marland Kitchen EYE SURGERY       OB History   No obstetric history on file.      Home Medications    Prior to Admission medications   Medication Sig Start Date End Date Taking? Authorizing Provider  amitriptyline (ELAVIL)  25 MG tablet Take 25 mg by mouth at bedtime as needed for sleep.     [provider]  COMBIGAN 0.2-0.5 % ophthalmic solution  10/08/18   [provider]  docusate sodium (STOOL SOFTENER) 100 MG capsule Take 100 mg by mouth 2 (two) times daily as needed for mild constipation or moderate constipation.     [provider]  esomeprazole (NEXIUM) 40 MG capsule Take 40 mg by mouth daily before breakfast.      [provider]  fish oil-omega-3 fatty acids 1000 MG capsule Take 1 g by mouth daily.     [provider]  fluticasone (FLONASE) 50 MCG/ACT nasal spray Place 2 sprays into both nostrils daily.   04/11/13   [provider]  meclizine (ANTIVERT) 25 MG tablet Take 1 tablet (25 mg total) by mouth 3 (three) times daily as needed for dizziness. 09/13/16   Rolland Porter, MD  methylcellulose (ARTIFICIAL TEARS) 1 % ophthalmic solution Place 1 drop into both eyes 2 (two) times daily as needed (dry eyes).    [provider]  mometasone (ELOCON) 0.1 % ointment Apply 1 application topically daily as needed (for irritation).  04/30/18   [provider]  montelukast (SINGULAIR) 10 MG tablet Take 10 mg by mouth daily as needed (for allergies).     [provider]  Multiple Vitamin (MULTIVITAMIN) capsule Take 1 capsule by mouth daily.      [provider]  potassium chloride SA (K-DUR,KLOR-CON) 20 MEQ tablet Take 1 tablet (20 mEq total) by mouth 2 (two) times daily. Patient taking differently: Take 20 mEq by mouth daily.  02/27/17   Baird Cancer, PA-C  traMADol (ULTRAM) 50 MG tablet Take 50 mg by mouth daily as needed for moderate pain.  09/24/13   [provider]    Family History Family History  Problem Relation Age of Onset  . Heart failure Mother   . Diabetes Sister   . Heart attack Father   . Colon cancer Neg Hx     Social History Social History   Tobacco Use  . Smoking status: Former Smoker    Packs/day: 1.00    Years: 25.00    Pack years: 25.00    Types: Cigarettes    Last attempt to quit: 09/14/2007    Years since quitting: 11.2  . Smokeless tobacco: Never Used  Substance Use Topics  . Alcohol use: Yes    Comment: occasional/rare (Brandy every now and then)  . Drug use: No     Allergies   Latex; Sulfa antibiotics; and Sulfonamide derivatives   Review of Systems Review of Systems  All other systems reviewed and are negative.    Physical Exam Updated Vital Signs BP (!) 144/84 (BP Location: Left Arm)   Pulse 81   Temp 98.9 F (37.2 C) (Oral)   Resp 18   Ht 1.626 m (5\' 4" )   Wt 68.9 kg   SpO2 100%   BMI 26.09  kg/m   Physical Exam Vitals signs and nursing note reviewed.  Constitutional:      General: She is not in acute distress.    Appearance: She is well-developed.  HENT:     Head: Normocephalic and atraumatic.     Right Ear: External ear normal.     Left Ear: External ear normal.  Eyes:     General: No scleral icterus.       Right eye: No discharge.  Left eye: No discharge.     Conjunctiva/sclera: Conjunctivae normal.  Neck:     Musculoskeletal: Neck supple.     Trachea: No tracheal deviation.  Cardiovascular:     Rate and Rhythm: Normal rate.  Pulmonary:     Effort: Pulmonary effort is normal. No respiratory distress.     Breath sounds: No stridor.  Abdominal:     General: There is no distension.  Musculoskeletal:        General: Tenderness present. No deformity.     Right knee: She exhibits decreased range of motion. She exhibits no effusion and no laceration. Tenderness found.     Comments: Band-Aid overlying the knee  Skin:    General: Skin is warm and dry.     Findings: No rash.  Neurological:     Mental Status: She is alert.     Cranial Nerves: Cranial nerve deficit: no gross deficits.      ED Treatments / Results  Labs (all labs ordered are listed, but only abnormal results are displayed) Labs Reviewed - No data to display  EKG None  Radiology No results found.  Procedures Procedures (including critical care time)  Medications Ordered in ED Medications  ondansetron (ZOFRAN) injection 4 mg (4 mg Intravenous Given 12/25/18 2012)  morphine 4 MG/ML injection 4 mg (4 mg Intravenous Given 12/25/18 2013)  ketorolac (TORADOL) 30 MG/ML injection 15 mg (15 mg Intravenous Given 12/25/18 2057)     Initial Impression / Assessment and Plan / ED Course  I have reviewed the triage vital signs and the nursing notes.  Pertinent labs & imaging results that were available during my care of the patient were reviewed by me and considered in my medical decision  making (see chart for details).  Clinical Course as of Dec 25 2134  Tue Dec 25, 2018  2045 Pt states the pain is no better.  Will give additional meds   [JK]  2135 Patient has improved with pain medications.  She is more comfortable now   [JK]    Clinical Course User Index [JK] Dorie Rank, MD    Patient noted with knee pain after injection at what I presume is her orthopedic doctor's office.  There is no significant swelling.  She has not fallen.  There is no evidence to suggest infection.  I suspect she is having pain from having had the injection today.  Patient was treated with pain medications.  Her symptoms improved.  Has Tylenol and Ultram available at home.  Final Clinical Impressions(s) / ED Diagnoses   Final diagnoses:  Acute pain of right knee    ED Discharge Orders    None       Dorie Rank, MD 12/25/18 2136

## 2018-12-25 NOTE — ED Triage Notes (Signed)
Pt arrives from home via EMS. Pt was seen at primary care today for right knee pain. Pt denies injury. Pt received "a shot" at her PCP but unsure of what it was. Pt states she has Hx of arthritis.

## 2018-12-25 NOTE — ED Notes (Signed)
Pt given water  Per request

## 2018-12-25 NOTE — Discharge Instructions (Addendum)
Take your medications at home as needed for pain, follow-up with your orthopedic doctor as planned, the pain from the injection should be improving in the next day

## 2019-03-12 NOTE — Progress Notes (Signed)
REVIEWED-NO ADDITIONAL RECOMMENDATIONS. 

## 2019-03-16 ENCOUNTER — Other Ambulatory Visit: Payer: Self-pay

## 2019-03-16 ENCOUNTER — Emergency Department (HOSPITAL_COMMUNITY): Payer: Medicare Other

## 2019-03-16 ENCOUNTER — Encounter (HOSPITAL_COMMUNITY): Payer: Self-pay | Admitting: Emergency Medicine

## 2019-03-16 ENCOUNTER — Emergency Department (HOSPITAL_COMMUNITY)
Admission: EM | Admit: 2019-03-16 | Discharge: 2019-03-16 | Disposition: A | Discharge status: Home / Self Care | Payer: Medicare Other | Attending: Emergency Medicine | Admitting: Emergency Medicine

## 2019-03-16 DIAGNOSIS — M6281 Muscle weakness (generalized): Secondary | ICD-10-CM | POA: Diagnosis not present

## 2019-03-16 DIAGNOSIS — R531 Weakness: Secondary | ICD-10-CM | POA: Diagnosis present

## 2019-03-16 DIAGNOSIS — R55 Syncope and collapse: Secondary | ICD-10-CM | POA: Diagnosis not present

## 2019-03-16 DIAGNOSIS — I10 Essential (primary) hypertension: Secondary | ICD-10-CM | POA: Insufficient documentation

## 2019-03-16 DIAGNOSIS — Z87891 Personal history of nicotine dependence: Secondary | ICD-10-CM | POA: Insufficient documentation

## 2019-03-16 LAB — URINALYSIS, ROUTINE W REFLEX MICROSCOPIC
Bacteria, UA: NONE SEEN
Bilirubin Urine: NEGATIVE
Glucose, UA: NEGATIVE mg/dL
Hgb urine dipstick: NEGATIVE
Ketones, ur: NEGATIVE mg/dL
Nitrite: NEGATIVE
Protein, ur: NEGATIVE mg/dL
Specific Gravity, Urine: 1.004 — ABNORMAL LOW (ref 1.005–1.030)
pH: 5 (ref 5.0–8.0)

## 2019-03-16 LAB — CBC WITH DIFFERENTIAL/PLATELET
Abs Immature Granulocytes: 0.01 10*3/uL (ref 0.00–0.07)
Basophils Absolute: 0 10*3/uL (ref 0.0–0.1)
Basophils Relative: 1 %
Eosinophils Absolute: 0.1 10*3/uL (ref 0.0–0.5)
Eosinophils Relative: 2 %
HCT: 36 % (ref 36.0–46.0)
Hemoglobin: 11.5 g/dL — ABNORMAL LOW (ref 12.0–15.0)
Immature Granulocytes: 0 %
Lymphocytes Relative: 29 %
Lymphs Abs: 1.2 10*3/uL (ref 0.7–4.0)
MCH: 30.7 pg (ref 26.0–34.0)
MCHC: 31.9 g/dL (ref 30.0–36.0)
MCV: 96 fL (ref 80.0–100.0)
Monocytes Absolute: 0.4 10*3/uL (ref 0.1–1.0)
Monocytes Relative: 9 %
Neutro Abs: 2.6 10*3/uL (ref 1.7–7.7)
Neutrophils Relative %: 59 %
Platelets: 270 10*3/uL (ref 150–400)
RBC: 3.75 MIL/uL — ABNORMAL LOW (ref 3.87–5.11)
RDW: 14.2 % (ref 11.5–15.5)
WBC: 4.3 10*3/uL (ref 4.0–10.5)
nRBC: 0 % (ref 0.0–0.2)

## 2019-03-16 LAB — COMPREHENSIVE METABOLIC PANEL
ALT: 12 U/L (ref 0–44)
AST: 21 U/L (ref 15–41)
Albumin: 4 g/dL (ref 3.5–5.0)
Alkaline Phosphatase: 67 U/L (ref 38–126)
Anion gap: 9 (ref 5–15)
BUN: 8 mg/dL (ref 8–23)
CO2: 24 mmol/L (ref 22–32)
Calcium: 9.3 mg/dL (ref 8.9–10.3)
Chloride: 108 mmol/L (ref 98–111)
Creatinine, Ser: 0.81 mg/dL (ref 0.44–1.00)
GFR calc Af Amer: >60 mL/min (ref >60)
GFR calc non Af Amer: >60 mL/min (ref >60)
Glucose, Bld: 100 mg/dL — ABNORMAL HIGH (ref 70–99)
Potassium: 4.2 mmol/L (ref 3.5–5.1)
Sodium: 141 mmol/L (ref 135–145)
Total Bilirubin: 0.1 mg/dL — ABNORMAL LOW (ref 0.3–1.2)
Total Protein: 7.2 g/dL (ref 6.5–8.1)

## 2019-03-16 LAB — TROPONIN I: Troponin I: <0.03 ng/mL (ref <0.03)

## 2019-03-16 MED ORDER — SODIUM CHLORIDE 0.9 % IV BOLUS
500.0000 mL | Freq: Once | INTRAVENOUS | Status: AC
  Administered 2019-03-16: 13:00:00 500 mL via INTRAVENOUS

## 2019-03-16 NOTE — ED Triage Notes (Signed)
Pt states she has been feeling generally weak for the past few days. Had some loose stools over the past few days "with a lot of straining", thought she had a fever last night.

## 2019-03-16 NOTE — ED Provider Notes (Signed)
Emergency Department Provider Note   I have reviewed the triage vital signs and the nursing notes.   HISTORY  Chief Complaint Weakness   HPI Melody Mitchell is a 78 y.o. female with PMH of arthritis, HLD, HTN, recurrent PNA, and anemia presents to the emergency department with generalized weakness over the past 2 to 3 days.  She has had some associated loose stools and elevated temp last night (Tmax 47F).  She states that symptoms are worse when she is standing and walking around.  She feels lightheaded but denies syncope event.  She is not experiencing chest pain/pressure, palpitations, shortness of breath.  She has not been experiencing cough.  No abdominal pain.  No nausea/vomiting.  She has noticed some straining with going to the bathroom had some associated loose stool.  No blood in the bowel movements.  No new medications.  Denies any unilateral weakness or numbness.  No vision changes.   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       Bronchitis january 2014   Hiatal hernia    High cholesterol    History of contact dermatitis    Hx: recurrent pneumonia    Hypertension    IDA (iron deficiency anemia)    chronic iron infusions   Reflux    Vertigo     Patient Active Problem List   Diagnosis Date Noted   Abnormal CT of the abdomen 02/02/2017   Rectal bleeding 02/02/2017   Aortic atherosclerosis ([place]) 01/03/2017   Colitis 01/02/2017   Hypokalemia 01/02/2017   Heme positive stool 06/10/2015   Change in bowel habits 06/10/2015   Abdominal pain, epigastric 06/10/2015   High cholesterol    Acid reflux    GERD 01/03/2011   HEMORRHOIDS 04/15/2010   Iron deficiency anemia due to chronic blood loss 01/12/2010   AVM (arteriovenous malformation) of colon, 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 four colon polyps removed. No source  for change in bowel habits identified 3. Rectal bleeding due to large internal hemorrhoids    COLONOSCOPY W/ BIOPSIES  6 2010   Dr. Oneida Alar: Polypoid appearing lesion of the ascending colon, 3 mm sessile rectal polyp, small internal hemorrhoids. Pathology revealed polypoid mucosa, no adenomatous changes   Double-balloon enteroscopy, antegrade  April 2011   Dr. 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   ESOPHAGOGASTRODUODENOSCOPY  04/2009   Dr. Oneida Alar: Patent Schatzki ring dilated with advancing the scope, patchy erythema in the antrum, 2 small AVMs in the duodenal bulb, 2 additional AVMs noted in the second portion the duodenum, mild gastritis on pathology   ESOPHAGOGASTRODUODENOSCOPY N/A 06/29/2015   SLF: 1. 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 small hiatal hernia 3. Mild non-erosive gastririts- NO obvious source for dyspepsia identified.    EYE SURGERY      Allergies Latex; Sulfa antibiotics; and Sulfonamide derivatives  Family History  Problem Relation Age of Onset   Heart failure Mother    Diabetes Sister    Heart attack Father    Colon cancer Neg Hx     Social History Social History   Tobacco Use  Smoking status: Former Smoker    Packs/day: 1.00    Years: 25.00    Pack years: 25.00    Types: Cigarettes    Last attempt to quit: 09/14/2007    Years since quitting: 11.5   Smokeless tobacco: Never Used  Substance Use Topics   Alcohol use: Yes    Comment: occasional/rare (Brandy every now and then)   Drug use: No    Review of Systems  Constitutional: No fever/chills. Positive generalized weakness.  Eyes: No visual changes. ENT: No sore throat. Cardiovascular: Denies chest pain. Respiratory: Denies shortness of breath. Gastrointestinal:  No abdominal pain.  No nausea, no vomiting. No constipation. Positive loose stools.  Genitourinary: Negative for dysuria. Musculoskeletal: Negative for back pain. Skin: Negative for rash. Neurological: Negative for headaches, focal weakness or numbness.  10-point ROS otherwise negative.  ____________________________________________   PHYSICAL EXAM:  VITAL SIGNS: ED Triage Vitals  Enc Vitals Group     BP 03/16/19 1145 (!) 137/99     Pulse Rate 03/16/19 1145 65     Resp 03/16/19 1145 18     Temp 03/16/19 1145 98.9 F (37.2 C)     Temp Source 03/16/19 1145 Oral     SpO2 03/16/19 1145 98 %     Weight 03/16/19 1144 148 lb (67.1 kg)     Height 03/16/19 1144 5\' 4"  (1.626 m)     Pain Score 03/16/19 1144 0   Constitutional: Alert and oriented. Well appearing and in no acute distress. Eyes: Conjunctivae are normal. Head: Atraumatic. Nose: No congestion/rhinnorhea. Mouth/Throat: Mucous membranes are slightly dry.  Neck: No stridor.   Cardiovascular: Normal rate, regular rhythm. Good peripheral circulation. Grossly normal heart sounds.   Respiratory: Normal respiratory effort.  No retractions. Lungs CTAB. Gastrointestinal: Soft and nontender. No distention.  Musculoskeletal: No lower extremity tenderness nor edema. No gross deformities of extremities. Neurologic:  Normal speech and language. No gross focal neurologic deficits are appreciated. Equal strength 5/5 in the upper and lower extremities.  Skin:  Skin is warm, dry and intact. No rash noted.  ____________________________________________   LABS (all labs ordered are listed, but only abnormal results are displayed)  Labs Reviewed  COMPREHENSIVE METABOLIC PANEL - Abnormal; Notable for the following components:      Result Value   Glucose, Bld 100 (*)    Total Bilirubin 0.1 (*)    All other components within normal limits  CBC WITH DIFFERENTIAL/PLATELET - Abnormal; Notable for the following components:   RBC 3.75 (*)     Hemoglobin 11.5 (*)    All other components within normal limits  URINALYSIS, ROUTINE W REFLEX MICROSCOPIC - Abnormal; Notable for the following components:   Color, Urine STRAW (*)    Specific Gravity, Urine 1.004 (*)    Leukocytes,Ua SMALL (*)    All other components within normal limits  TROPONIN I   ____________________________________________  EKG   EKG Interpretation  Date/Time:  Saturday Mar 16 2019 11:47:10 EDT Ventricular Rate:  66 PR Interval:    QRS Duration: 93 QT Interval:  390 QTC Calculation: 409 R Axis:   -10 Text Interpretation:  Sinus rhythm Low voltage, precordial leads Abnormal R-wave progression, early transition Nonspecific T abnormalities, anterior leads No STEMI.  Confirmed by Nanda Quinton [phone]) on 03/16/2019 12:19:15 PM       ____________________________________________  RADIOLOGY  Dg Chest 2 View  Result Date: 03/16/2019 CLINICAL DATA:  Near syncope. EXAM: CHEST - 2 VIEW COMPARISON:  08/03/2018 FINDINGS: Lungs are clear. Heart  size is upper limits of normal but stable. Negative for a pneumothorax. Trachea is midline. Possible calcified granuloma in the anterior upper chest that appears stable. No large pleural effusions. Degenerative endplate changes in the thoracic spine. Possible compression deformity involving the T12 vertebral body but this could be chronic. IMPRESSION: No active cardiopulmonary disease. Possible vertebral body compression deformity at T12 is age indeterminate and could be chronic. Electronically Signed   By: Markus Daft M.D.   On: 03/16/2019 13:01    ____________________________________________   PROCEDURES  Procedure(s) performed:   Procedures  None  ____________________________________________   INITIAL IMPRESSION / ASSESSMENT AND PLAN / ED COURSE  Pertinent labs & imaging results that were available during my care of the patient were reviewed by me and considered in my medical decision making (see chart for  details).   Patient presents to the emergency department for evaluation of generalized weakness.  She has had some loose stools but denies frank diarrhea.  No abdominal tenderness on exam.  She has equal strength in the upper and lower extremities.  No focal neurologic deficit.  Patient not experiencing chest pain.  Lower suspicion for atypical angina presentation.  No ischemic changes on EKG. no UTI symptoms.  No medication changes.  Plan for screening lab work including troponin along with urine analysis.  Will obtain a chest x-ray given the patient's history of recurrent pneumonia although no classic pneumonia symptoms.   01:22 PM  Lab work reviewed.  No acute findings to explain the patient's weakness.  She reports feeling better after IV fluids.  I suspect some degree of dehydration contributing to her symptoms.  All signs have remained unremarkable.  Chest x-ray with no infiltrate.  The patient lives alone.  She has been ambulatory here in the emergency department.  She has a walker at home.  I encouraged her to keep this close and use it at least for the next several days until her weakness improves.  She will increase her oral hydration.  We discussed reasons to return to the emergency department.  I advised that the patient call her primary care physician on Monday to schedule the next available outpatient appointment.  Patient feels comfortable with the plan at discharge. ____________________________________________  FINAL CLINICAL IMPRESSION(S) / ED DIAGNOSES  Final diagnoses:  Generalized weakness    MEDICATIONS GIVEN DURING THIS VISIT:  Medications  sodium chloride 0.9 % bolus 500 mL (500 mLs Intravenous New Bag/Given 03/16/19 1254)    Note:  This document was prepared using Dragon voice recognition software and may include unintentional dictation errors.  Nanda Quinton, MD Emergency Medicine    Rama Sorci, [facility], MD 03/16/19 [phone]

## 2019-03-16 NOTE — Discharge Instructions (Signed)
Your evaluation today shows that you may be experiencing some dehydration. Try to increase your hydrating fluids at home like water. Take your medication as prescribed and call your PCP on Monday to schedule a follow up appointment. Keep your walker within reach and use then while walking until your strength has returned to normal.   Return to the ED with any chest pain, passing out, trouble breathing, abdominal pain, worsening symptoms, or any other sudden symptoms that concern you.

## 2019-04-03 ENCOUNTER — Other Ambulatory Visit: Payer: Self-pay

## 2019-04-03 ENCOUNTER — Inpatient Hospital Stay (HOSPITAL_COMMUNITY): Payer: Medicare Other | Attending: Hematology

## 2019-04-03 DIAGNOSIS — K449 Diaphragmatic hernia without obstruction or gangrene: Secondary | ICD-10-CM | POA: Diagnosis not present

## 2019-04-03 DIAGNOSIS — M129 Arthropathy, unspecified: Secondary | ICD-10-CM | POA: Diagnosis not present

## 2019-04-03 DIAGNOSIS — K5520 Angiodysplasia of colon without hemorrhage: Secondary | ICD-10-CM | POA: Insufficient documentation

## 2019-04-03 DIAGNOSIS — I10 Essential (primary) hypertension: Secondary | ICD-10-CM | POA: Insufficient documentation

## 2019-04-03 DIAGNOSIS — R42 Dizziness and giddiness: Secondary | ICD-10-CM | POA: Diagnosis not present

## 2019-04-03 DIAGNOSIS — E7800 Pure hypercholesterolemia, unspecified: Secondary | ICD-10-CM | POA: Insufficient documentation

## 2019-04-03 DIAGNOSIS — K219 Gastro-esophageal reflux disease without esophagitis: Secondary | ICD-10-CM | POA: Diagnosis not present

## 2019-04-03 DIAGNOSIS — R5383 Other fatigue: Secondary | ICD-10-CM | POA: Insufficient documentation

## 2019-04-03 DIAGNOSIS — Z87891 Personal history of nicotine dependence: Secondary | ICD-10-CM | POA: Diagnosis not present

## 2019-04-03 DIAGNOSIS — Z79899 Other long term (current) drug therapy: Secondary | ICD-10-CM | POA: Insufficient documentation

## 2019-04-03 DIAGNOSIS — D500 Iron deficiency anemia secondary to blood loss (chronic): Secondary | ICD-10-CM | POA: Insufficient documentation

## 2019-04-03 LAB — FERRITIN: Ferritin: 102 ng/mL (ref 11–307)

## 2019-04-03 LAB — CBC WITH DIFFERENTIAL/PLATELET
Abs Immature Granulocytes: 0.01 10*3/uL (ref 0.00–0.07)
Basophils Absolute: 0 10*3/uL (ref 0.0–0.1)
Basophils Relative: 0 %
Eosinophils Absolute: 0.1 10*3/uL (ref 0.0–0.5)
Eosinophils Relative: 2 %
HCT: 35.4 % — ABNORMAL LOW (ref 36.0–46.0)
Hemoglobin: 11.1 g/dL — ABNORMAL LOW (ref 12.0–15.0)
Immature Granulocytes: 0 %
Lymphocytes Relative: 26 %
Lymphs Abs: 1.1 10*3/uL (ref 0.7–4.0)
MCH: 30.7 pg (ref 26.0–34.0)
MCHC: 31.4 g/dL (ref 30.0–36.0)
MCV: 98.1 fL (ref 80.0–100.0)
Monocytes Absolute: 0.5 10*3/uL (ref 0.1–1.0)
Monocytes Relative: 11 %
Neutro Abs: 2.5 10*3/uL (ref 1.7–7.7)
Neutrophils Relative %: 61 %
Platelets: 270 10*3/uL (ref 150–400)
RBC: 3.61 MIL/uL — ABNORMAL LOW (ref 3.87–5.11)
RDW: 13.7 % (ref 11.5–15.5)
WBC: 4.1 10*3/uL (ref 4.0–10.5)
nRBC: 0 % (ref 0.0–0.2)

## 2019-04-03 LAB — VITAMIN B12: Vitamin B-12: 257 pg/mL (ref 180–914)

## 2019-04-03 LAB — IRON AND TIBC
Iron: 44 ug/dL (ref 28–170)
Saturation Ratios: 17 % (ref 10.4–31.8)
TIBC: 255 ug/dL (ref 250–450)
UIBC: 211 ug/dL

## 2019-04-03 LAB — COMPREHENSIVE METABOLIC PANEL
ALT: 14 U/L (ref 0–44)
AST: 21 U/L (ref 15–41)
Albumin: 3.8 g/dL (ref 3.5–5.0)
Alkaline Phosphatase: 60 U/L (ref 38–126)
Anion gap: 9 (ref 5–15)
BUN: 11 mg/dL (ref 8–23)
CO2: 24 mmol/L (ref 22–32)
Calcium: 8.8 mg/dL — ABNORMAL LOW (ref 8.9–10.3)
Chloride: 107 mmol/L (ref 98–111)
Creatinine, Ser: 0.93 mg/dL (ref 0.44–1.00)
GFR calc Af Amer: >60 mL/min (ref >60)
GFR calc non Af Amer: 58 mL/min — ABNORMAL LOW (ref >60)
Glucose, Bld: 89 mg/dL (ref 70–99)
Potassium: 4.1 mmol/L (ref 3.5–5.1)
Sodium: 140 mmol/L (ref 135–145)
Total Bilirubin: 0.3 mg/dL (ref 0.3–1.2)
Total Protein: 6.8 g/dL (ref 6.5–8.1)

## 2019-04-03 LAB — FOLATE: Folate: 10.1 ng/mL (ref >5.9)

## 2019-04-10 ENCOUNTER — Other Ambulatory Visit: Payer: Self-pay

## 2019-04-10 ENCOUNTER — Encounter (HOSPITAL_COMMUNITY): Payer: Self-pay | Admitting: Nurse Practitioner

## 2019-04-10 ENCOUNTER — Inpatient Hospital Stay (HOSPITAL_BASED_OUTPATIENT_CLINIC_OR_DEPARTMENT_OTHER): Payer: Medicare Other | Admitting: Nurse Practitioner

## 2019-04-10 DIAGNOSIS — Z87891 Personal history of nicotine dependence: Secondary | ICD-10-CM

## 2019-04-10 DIAGNOSIS — K219 Gastro-esophageal reflux disease without esophagitis: Secondary | ICD-10-CM | POA: Diagnosis not present

## 2019-04-10 DIAGNOSIS — E7800 Pure hypercholesterolemia, unspecified: Secondary | ICD-10-CM

## 2019-04-10 DIAGNOSIS — K5520 Angiodysplasia of colon without hemorrhage: Secondary | ICD-10-CM

## 2019-04-10 DIAGNOSIS — M129 Arthropathy, unspecified: Secondary | ICD-10-CM

## 2019-04-10 DIAGNOSIS — I10 Essential (primary) hypertension: Secondary | ICD-10-CM

## 2019-04-10 DIAGNOSIS — R5383 Other fatigue: Secondary | ICD-10-CM

## 2019-04-10 DIAGNOSIS — Z79899 Other long term (current) drug therapy: Secondary | ICD-10-CM

## 2019-04-10 DIAGNOSIS — D500 Iron deficiency anemia secondary to blood loss (chronic): Secondary | ICD-10-CM

## 2019-04-10 DIAGNOSIS — K449 Diaphragmatic hernia without obstruction or gangrene: Secondary | ICD-10-CM

## 2019-04-10 DIAGNOSIS — R42 Dizziness and giddiness: Secondary | ICD-10-CM

## 2019-04-10 NOTE — Patient Instructions (Addendum)
[place] at [facility] Discharge Instructions  Follow up in 3 months with labs  You will also get 2 infusions of IV iron. Please start taking vitamin B 12 daily.     Thank you for choosing [place] at [facility] to provide your oncology and hematology care.  To afford each patient quality time with our provider, please arrive at least 15 minutes before your scheduled appointment time.   If you have a lab appointment with the [facility] please come in thru the  Main Entrance and check in at the main information desk  You need to re-schedule your appointment should you arrive 10 or more minutes late.  We strive to give you quality time with our providers, and arriving late affects you and other patients whose appointments are after yours.  Also, if you no show three or more times for appointments you may be dismissed from the clinic at the providers discretion.     Again, thank you for choosing [facility].  Our hope is that these requests will decrease the amount of time that you wait before being seen by our physicians.       _____________________________________________________________  Should you have questions after your visit to [facility], please contact our office at [phone] between the hours of 8:00 a.m. and 4:30 p.m.  Voicemails left after 4:00 p.m. will not be returned until the following business day.  For prescription refill requests, have your pharmacy contact our office and allow 72 hours.    Cancer Center Support Programs:   > Cancer Support Group  2nd Tuesday of the month 1pm-2pm, Journey Room

## 2019-04-10 NOTE — Assessment & Plan Note (Addendum)
1.  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  She also had a EGD and colonoscopy in 2016 did not show any evidence of active bleeding areas, however she had large internal hemorrhoids. -Last [name] infusion on 12/05/2018 and 12/12/2018. - He denies any bleeding per rectum but does occasionally have dark stools. -Labs on 04/03/2019 showed his hemoglobin at 11.1, platelets at 270, ferritin 102, and percent saturation 17. -I have recommended her to get 2 more infusions of IV iron. -We will see her back in 3 months with repeat labs.

## 2019-04-10 NOTE — Addendum Note (Signed)
Addended by: Glennie Isle on: 04/10/2019 11:11 AM   Modules accepted: Orders

## 2019-04-10 NOTE — Progress Notes (Signed)
Melody Mitchell, Melody Mitchell [id number]   CLINIC:  Medical Oncology/Hematology  PCP:  Melody Schwalbe, MD [address] [place] [address] [phone]   REASON FOR VISIT: Follow-up for iron deficiency anemia  CURRENT THERAPY: Intermittent iron infusions   INTERVAL HISTORY:  Melody Mitchell 78 y.o. female returns for routine follow-up for iron deficiency anemia.  She reports after her last iron infusion she did feel less fatigued and had more energy.  She reports she is starting to feel fatigued throughout the day again.  She denies any bright red bleeding per rectum.  Otherwise she is doing well since her last visit. Denies any nausea, vomiting, or diarrhea. Denies any new pains. Had not noticed any recent bleeding such as epistaxis, hematuria or hematochezia. Denies recent chest pain on exertion, shortness of breath on minimal exertion, pre-syncopal episodes, or palpitations. Denies any numbness or tingling in hands or feet. Denies any recent fevers, infections, or recent hospitalizations. Patient reports appetite at 100% and energy level at 100%.  She is eating well and maintaining her weight at this time.    REVIEW OF SYSTEMS:  Review of Systems  All other systems reviewed and are negative.    PAST MEDICAL/SURGICAL HISTORY:  Past Medical History:  Diagnosis Date  . Acid reflux   . Arthritis   . 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 Bronchitis january 2014  . Hiatal hernia   . High cholesterol   . History of contact dermatitis   . Hx: recurrent pneumonia   . Hypertension   . IDA (iron deficiency anemia)    chronic iron infusions  . Reflux   . Vertigo    Past Surgical History:  Procedure Laterality Date  . ABDOMINAL HYSTERECTOMY    . APPENDECTOMY    . BILATERAL BREAST BIOPSIES  BENIGN    . CHOLECYSTECTOMY    . COLONOSCOPY N/A 06/29/2015   SLF: The left colon is redundant 2. four colon polyps removed. No  source  for change in bowel habits identified 3. Rectal bleeding due to large internal hemorrhoids   . COLONOSCOPY W/ BIOPSIES  6 2010   Dr. Oneida Alar: Polypoid appearing lesion of the ascending colon, 3 mm sessile rectal polyp, small internal hemorrhoids. Pathology revealed polypoid mucosa, no adenomatous changes  . Double-balloon enteroscopy, antegrade  April 2011   Dr. 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  Marland Kitchen ESOPHAGOGASTRODUODENOSCOPY  04/2009   Dr. Oneida Alar: Patent Schatzki ring dilated with advancing the scope, patchy erythema in the antrum, 2 small AVMs in the duodenal bulb, 2 additional AVMs noted in the second portion the duodenum, mild gastritis on pathology  . ESOPHAGOGASTRODUODENOSCOPY N/A 06/29/2015   SLF: 1. 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 small hiatal hernia 3. Mild non-erosive gastririts- NO obvious source for dyspepsia identified.   Marland Kitchen EYE SURGERY       SOCIAL HISTORY:  Social History   Socioeconomic History  . Marital status: Divorced    Spouse name: Not on file  . Number of children: 1  . Years of education: Not on file  . Highest education level: Not on file  Occupational History  . Not on file  Social Needs  . Financial resource strain: Not on file  . Food insecurity:    Worry: Not on file    Inability: Not on file  . Transportation needs:    Medical: Not on file  Non-medical: Not on file  Tobacco Use  . Smoking status: Former Smoker    Packs/day: 1.00    Years: 25.00    Pack years: 25.00    Types: Cigarettes    Last attempt to quit: 09/14/2007    Years since quitting: 11.5  . Smokeless tobacco: Never Used  Substance and Sexual Activity  . Alcohol use: Yes    Comment: occasional/rare (Brandy every now and then)  . Drug use: No  . Sexual activity: Yes    Birth control/protection: Post-menopausal, Surgical  Lifestyle  . Physical activity:    Days per week: Not on file    Minutes per session: Not on file   . Stress: Not on file  Relationships  . Social connections:    Talks on phone: Not on file    Gets together: Not on file    Attends religious service: Not on file    Active member of club or organization: Not on file    Attends meetings of clubs or organizations: Not on file    Relationship status: Not on file  . Intimate partner violence:    Fear of current or ex partner: Not on file    Emotionally abused: Not on file    Physically abused: Not on file    Forced sexual activity: Not on file  Other Topics Concern  . Not on file  Social History Narrative  . Not on file    FAMILY HISTORY:  Family History  Problem Relation Age of Onset  . Heart failure Mother   . Diabetes Sister   . Heart attack Father   . Colon cancer Neg Hx     CURRENT MEDICATIONS:  Outpatient Encounter Medications as of 04/10/2019  Medication Sig  . amitriptyline (ELAVIL) 25 MG tablet Take 25 mg by mouth at bedtime as needed for sleep.   . clobetasol cream (TEMOVATE) 0.05 %   . COMBIGAN 0.2-0.5 % ophthalmic solution Place 1 drop into the left eye every 12 (twelve) hours.   . diclofenac sodium (VOLTAREN) 1 % GEL Apply 2 g topically 4 (four) times daily.   Marland Kitchen docusate sodium (STOOL SOFTENER) 100 MG capsule Take 100 mg by mouth 2 (two) times daily as needed for mild constipation or moderate constipation.   Marland Kitchen escitalopram (LEXAPRO) 10 MG tablet Take 5 mg by mouth daily.   Marland Kitchen esomeprazole (NEXIUM) 40 MG capsule Take 40 mg by mouth daily before breakfast.    . ezetimibe (ZETIA) 10 MG tablet Take 10 mg by mouth daily.   . fish oil-omega-3 fatty acids 1000 MG capsule Take 1 g by mouth daily.   . fluticasone (FLONASE) 50 MCG/ACT nasal spray Place 2 sprays into both nostrils daily.   . meclizine (ANTIVERT) 25 MG tablet Take 1 tablet (25 mg total) by mouth 3 (three) times daily as needed for dizziness.  . methylcellulose (ARTIFICIAL TEARS) 1 % ophthalmic solution Place 1 drop into both eyes 2 (two) times daily as needed  (dry eyes).  . mometasone (ELOCON) 0.1 % ointment Apply 1 application topically daily as needed (for irritation).   . montelukast (SINGULAIR) 10 MG tablet Take 10 mg by mouth daily as needed (for allergies).   . Multiple Vitamin (MULTIVITAMIN) capsule Take 1 capsule by mouth daily.    . potassium chloride SA (K-DUR,KLOR-CON) 20 MEQ tablet Take 1 tablet (20 mEq total) by mouth 2 (two) times daily. (Patient taking differently: Take 20 mEq by mouth daily. )  . traMADol (ULTRAM) 50 MG  tablet Take 50 mg by mouth daily as needed for moderate pain.    No facility-administered encounter medications on file as of 04/10/2019.     ALLERGIES:  Allergies  Allergen Reactions  . Latex Hives, Itching and Rash  . Sulfa Antibiotics Hives and Itching  . Sulfonamide Derivatives Hives and Itching     PHYSICAL EXAM:  ECOG Performance status: 1  Vitals:   04/10/19 1000  BP: (!) 147/93  Pulse: 66  Resp: 16  Temp: 99.3 F (37.4 C)  SpO2: 99%   Filed Weights   04/10/19 1000  Weight: 148 lb 4 oz (67.2 kg)    Physical Exam Constitutional:      Appearance: Normal appearance. She is normal weight.  Cardiovascular:     Rate and Rhythm: Normal rate and regular rhythm.     Heart sounds: Normal heart sounds.  Pulmonary:     Effort: Pulmonary effort is normal.     Breath sounds: Normal breath sounds.  Abdominal:     General: Bowel sounds are normal.     Palpations: Abdomen is soft.  Musculoskeletal: Normal range of motion.  Skin:    General: Skin is warm and dry.  Neurological:     Mental Status: She is alert and oriented to person, place, and time. Mental status is at baseline.  Psychiatric:        Mood and Affect: Mood normal.        Behavior: Behavior normal.        Thought Content: Thought content normal.        Judgment: Judgment normal.      LABORATORY DATA:  I have reviewed the labs as listed.  CBC    Component Value Date/Time   WBC 4.1 04/03/2019 1117   RBC 3.61 (L) 04/03/2019  1117   HGB 11.1 (L) 04/03/2019 1117   HCT 35.4 (L) 04/03/2019 1117   PLT 270 04/03/2019 1117   MCV 98.1 04/03/2019 1117   MCH 30.7 04/03/2019 1117   MCHC 31.4 04/03/2019 1117   RDW 13.7 04/03/2019 1117   LYMPHSABS 1.1 04/03/2019 1117   MONOABS 0.5 04/03/2019 1117   EOSABS 0.1 04/03/2019 1117   BASOSABS 0.0 04/03/2019 1117   CMP Latest Ref Rng & Units 04/03/2019 03/16/2019 11/23/2018  Glucose 70 - 99 mg/dL 89 100(H) 91  BUN 8 - 23 mg/dL 11 8 14   Creatinine 0.44 - 1.00 mg/dL 0.93 0.81 0.91  Sodium 135 - 145 mmol/L 140 141 139  Potassium 3.5 - 5.1 mmol/L 4.1 4.2 4.3  Chloride 98 - 111 mmol/L 107 108 109  CO2 22 - 32 mmol/L 24 24 26   Calcium 8.9 - 10.3 mg/dL 8.8(L) 9.3 8.9  Total Protein 6.5 - 8.1 g/dL 6.8 7.2 7.1  Total Bilirubin 0.3 - 1.2 mg/dL 0.3 0.1(L) 0.5  Alkaline Phos 38 - 126 U/L 60 67 60  AST 15 - 41 U/L 21 21 27   ALT 0 - 44 U/L 14 12 15      I personally performed a face-to-face visit.  All questions were answered to patient's stated satisfaction. Encouraged patient to call with any new concerns or questions before his next visit to the cancer center and we can certain see him sooner, if needed.     ASSESSMENT & PLAN:   Iron deficiency anemia due to chronic blood loss 1.  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  She also had a EGD and colonoscopy in 2016  did not show any evidence of active bleeding areas, however she had large internal hemorrhoids. -Last [name] infusion on 12/05/2018 and 12/12/2018. - He denies any bleeding per rectum but does occasionally have dark stools. -Labs on 04/03/2019 showed his hemoglobin at 11.1, platelets at 270, ferritin 102, and percent saturation 17. -I have recommended her to get 2 more infusions of IV iron. -We will see her back in 3 months with repeat labs.      Orders placed this encounter:  Orders Placed This Encounter  Procedures  . Lactate dehydrogenase  . CBC with  Differential/Platelet  . Comprehensive metabolic panel  . Ferritin  . Iron and TIBC  . Vitamin B12  . VITAMIN D 25 Hydroxy (Vit-D Deficiency, Fractures)  . Folate     Francene Finders, FNP-C [place] [phone]

## 2019-04-16 ENCOUNTER — Other Ambulatory Visit: Payer: Self-pay

## 2019-04-16 ENCOUNTER — Inpatient Hospital Stay (HOSPITAL_COMMUNITY): Payer: Medicare Other | Attending: Hematology

## 2019-04-16 ENCOUNTER — Encounter (HOSPITAL_COMMUNITY): Payer: Self-pay

## 2019-04-16 VITALS — BP 126/78 | HR 53 | Temp 98.7°F | Resp 18

## 2019-04-16 DIAGNOSIS — D500 Iron deficiency anemia secondary to blood loss (chronic): Secondary | ICD-10-CM | POA: Insufficient documentation

## 2019-04-16 DIAGNOSIS — Z79899 Other long term (current) drug therapy: Secondary | ICD-10-CM | POA: Diagnosis not present

## 2019-04-16 MED ORDER — SODIUM CHLORIDE 0.9 % IV SOLN
510.0000 mg | Freq: Once | INTRAVENOUS | Status: AC
  Administered 2019-04-16: 09:00:00 510 mg via INTRAVENOUS
  Filled 2019-04-16: qty 510

## 2019-04-16 MED ORDER — SODIUM CHLORIDE 0.9% FLUSH
10.0000 mL | INTRAVENOUS | Status: DC | PRN
  Administered 2019-04-16: 10 mL
  Filled 2019-04-16: qty 10

## 2019-04-16 MED ORDER — SODIUM CHLORIDE 0.9 % IV SOLN
Freq: Once | INTRAVENOUS | Status: AC
  Administered 2019-04-16: 08:00:00 via INTRAVENOUS

## 2019-04-16 NOTE — Progress Notes (Signed)
Pt presents today for [name]. VSS. No complaints of any pain or changes since the last visit.   [name]  given today per MD orders. Tolerated infusion without adverse affects. Vital signs stable. No complaints at this time. Discharged from clinic ambulatory. F/U with [facility] as scheduled.

## 2019-04-16 NOTE — Patient Instructions (Signed)
[facility] at [facility]  Discharge Instructions:   _______________________________________________________________  Thank you for choosing [facility] at [facility] to provide your oncology and hematology care.  To afford each patient quality time with our providers, please arrive at least 15 minutes before your scheduled appointment.  You need to re-schedule your appointment if you arrive 10 or more minutes late.  We strive to give you quality time with our providers, and arriving late affects you and other patients whose appointments are after yours.  Also, if you no show three or more times for appointments you may be dismissed from the clinic.  Again, thank you for choosing [facility] at [facility]. Our hope is that these requests will allow you access to exceptional care and in a timely manner. _______________________________________________________________  If you have questions after your visit, please contact our office at [phone] between the hours of 8:30 a.m. and 5:00 p.m. Voicemails left after 4:30 p.m. will not be returned until the following business day. _______________________________________________________________  For prescription refill requests, have your pharmacy contact our office. _______________________________________________________________  Recommendations made by the consultant and any test results will be sent to your referring physician. _______________________________________________________________ 

## 2019-04-23 ENCOUNTER — Encounter (HOSPITAL_COMMUNITY): Payer: Self-pay

## 2019-04-23 ENCOUNTER — Inpatient Hospital Stay (HOSPITAL_COMMUNITY): Payer: Medicare Other

## 2019-04-23 ENCOUNTER — Other Ambulatory Visit: Payer: Self-pay

## 2019-04-23 VITALS — BP 129/68 | HR 71 | Temp 98.4°F | Resp 18

## 2019-04-23 DIAGNOSIS — D500 Iron deficiency anemia secondary to blood loss (chronic): Secondary | ICD-10-CM

## 2019-04-23 MED ORDER — SODIUM CHLORIDE 0.9 % IV SOLN
510.0000 mg | Freq: Once | INTRAVENOUS | Status: AC
  Administered 2019-04-23: 510 mg via INTRAVENOUS
  Filled 2019-04-23: qty 17

## 2019-04-23 MED ORDER — SODIUM CHLORIDE 0.9 % IV SOLN
INTRAVENOUS | Status: DC
  Administered 2019-04-23: 08:00:00 via INTRAVENOUS

## 2019-04-23 NOTE — Patient Instructions (Signed)
[facility]  Discharge Instructions:   _______________________________________________________________  Thank you for choosing [facility] at [facility] to provide your oncology and hematology care.  To afford each patient quality time with our providers, please arrive at least 15 minutes before your scheduled appointment.  You need to re-schedule your appointment if you arrive 10 or more minutes late.  We strive to give you quality time with our providers, and arriving late affects you and other patients whose appointments are after yours.  Also, if you no show three or more times for appointments you may be dismissed from the clinic.  Again, thank you for choosing [facility] at [facility]. Our hope is that these requests will allow you access to exceptional care and in a timely manner. _______________________________________________________________  If you have questions after your visit, please contact our office at [phone] between the hours of 8:30 a.m. and 5:00 p.m. Voicemails left after 4:30 p.m. will not be returned until the following business day. _______________________________________________________________  For prescription refill requests, have your pharmacy contact our office. _______________________________________________________________  Recommendations made by the consultant and any test results will be sent to your referring physician. _______________________________________________________________ 

## 2019-04-23 NOTE — Progress Notes (Signed)
[name] given today per orders. Patient tolerated it well without problems. Vitals stable and discharged home from clinic ambulatory. Follow up as scheduled.  

## 2019-07-04 ENCOUNTER — Inpatient Hospital Stay (HOSPITAL_COMMUNITY): Payer: Medicare Other | Attending: Hematology

## 2019-07-04 ENCOUNTER — Other Ambulatory Visit: Payer: Self-pay

## 2019-07-04 DIAGNOSIS — E538 Deficiency of other specified B group vitamins: Secondary | ICD-10-CM | POA: Diagnosis not present

## 2019-07-04 DIAGNOSIS — Z87891 Personal history of nicotine dependence: Secondary | ICD-10-CM | POA: Insufficient documentation

## 2019-07-04 DIAGNOSIS — R5383 Other fatigue: Secondary | ICD-10-CM | POA: Diagnosis not present

## 2019-07-04 DIAGNOSIS — K219 Gastro-esophageal reflux disease without esophagitis: Secondary | ICD-10-CM | POA: Insufficient documentation

## 2019-07-04 DIAGNOSIS — I10 Essential (primary) hypertension: Secondary | ICD-10-CM | POA: Insufficient documentation

## 2019-07-04 DIAGNOSIS — M1990 Unspecified osteoarthritis, unspecified site: Secondary | ICD-10-CM | POA: Diagnosis not present

## 2019-07-04 DIAGNOSIS — Z79899 Other long term (current) drug therapy: Secondary | ICD-10-CM | POA: Insufficient documentation

## 2019-07-04 DIAGNOSIS — E7800 Pure hypercholesterolemia, unspecified: Secondary | ICD-10-CM | POA: Diagnosis not present

## 2019-07-04 DIAGNOSIS — Z791 Long term (current) use of non-steroidal anti-inflammatories (NSAID): Secondary | ICD-10-CM | POA: Diagnosis not present

## 2019-07-04 DIAGNOSIS — E559 Vitamin D deficiency, unspecified: Secondary | ICD-10-CM | POA: Diagnosis not present

## 2019-07-04 DIAGNOSIS — D500 Iron deficiency anemia secondary to blood loss (chronic): Secondary | ICD-10-CM | POA: Diagnosis not present

## 2019-07-04 LAB — CBC WITH DIFFERENTIAL/PLATELET
Abs Immature Granulocytes: 0.01 10*3/uL (ref 0.00–0.07)
Basophils Absolute: 0 10*3/uL (ref 0.0–0.1)
Basophils Relative: 1 %
Eosinophils Absolute: 0.1 10*3/uL (ref 0.0–0.5)
Eosinophils Relative: 2 %
HCT: 35 % — ABNORMAL LOW (ref 36.0–46.0)
Hemoglobin: 11.2 g/dL — ABNORMAL LOW (ref 12.0–15.0)
Immature Granulocytes: 0 %
Lymphocytes Relative: 30 %
Lymphs Abs: 1.2 10*3/uL (ref 0.7–4.0)
MCH: 31.7 pg (ref 26.0–34.0)
MCHC: 32 g/dL (ref 30.0–36.0)
MCV: 99.2 fL (ref 80.0–100.0)
Monocytes Absolute: 0.5 10*3/uL (ref 0.1–1.0)
Monocytes Relative: 13 %
Neutro Abs: 2.1 10*3/uL (ref 1.7–7.7)
Neutrophils Relative %: 54 %
Platelets: 241 10*3/uL (ref 150–400)
RBC: 3.53 MIL/uL — ABNORMAL LOW (ref 3.87–5.11)
RDW: 13.6 % (ref 11.5–15.5)
WBC: 3.8 10*3/uL — ABNORMAL LOW (ref 4.0–10.5)
nRBC: 0 % (ref 0.0–0.2)

## 2019-07-04 LAB — COMPREHENSIVE METABOLIC PANEL
ALT: 13 U/L (ref 0–44)
AST: 19 U/L (ref 15–41)
Albumin: 4 g/dL (ref 3.5–5.0)
Alkaline Phosphatase: 62 U/L (ref 38–126)
Anion gap: 5 (ref 5–15)
BUN: 18 mg/dL (ref 8–23)
CO2: 28 mmol/L (ref 22–32)
Calcium: 9 mg/dL (ref 8.9–10.3)
Chloride: 105 mmol/L (ref 98–111)
Creatinine, Ser: 0.98 mg/dL (ref 0.44–1.00)
GFR calc Af Amer: >60 mL/min (ref >60)
GFR calc non Af Amer: 54 mL/min — ABNORMAL LOW (ref >60)
Glucose, Bld: 90 mg/dL (ref 70–99)
Potassium: 4.1 mmol/L (ref 3.5–5.1)
Sodium: 138 mmol/L (ref 135–145)
Total Bilirubin: 0.4 mg/dL (ref 0.3–1.2)
Total Protein: 7.3 g/dL (ref 6.5–8.1)

## 2019-07-04 LAB — IRON AND TIBC
Iron: 51 ug/dL (ref 28–170)
Saturation Ratios: 19 % (ref 10.4–31.8)
TIBC: 269 ug/dL (ref 250–450)
UIBC: 218 ug/dL

## 2019-07-04 LAB — FOLATE: Folate: 14 ng/mL (ref >5.9)

## 2019-07-04 LAB — VITAMIN B12: Vitamin B-12: 212 pg/mL (ref 180–914)

## 2019-07-04 LAB — FERRITIN: Ferritin: 291 ng/mL (ref 11–307)

## 2019-07-04 LAB — LACTATE DEHYDROGENASE: LDH: 116 U/L (ref 98–192)

## 2019-07-05 LAB — VITAMIN D 25 HYDROXY (VIT D DEFICIENCY, FRACTURES): Vit D, 25-Hydroxy: 18 ng/mL — ABNORMAL LOW (ref 30.0–100.0)

## 2019-07-11 ENCOUNTER — Other Ambulatory Visit: Payer: Self-pay

## 2019-07-11 ENCOUNTER — Inpatient Hospital Stay (HOSPITAL_BASED_OUTPATIENT_CLINIC_OR_DEPARTMENT_OTHER): Payer: Medicare Other | Admitting: Nurse Practitioner

## 2019-07-11 VITALS — BP 130/80 | HR 50 | Temp 97.5°F | Resp 18 | Wt 148.0 lb

## 2019-07-11 DIAGNOSIS — D500 Iron deficiency anemia secondary to blood loss (chronic): Secondary | ICD-10-CM

## 2019-07-11 DIAGNOSIS — E559 Vitamin D deficiency, unspecified: Secondary | ICD-10-CM

## 2019-07-11 MED ORDER — ERGOCALCIFEROL 1.25 MG (50000 UT) PO CAPS: 50000.0000 [IU] | ORAL_CAPSULE | ORAL | 1 refills | Status: DC

## 2019-07-11 NOTE — Patient Instructions (Signed)
[facility] at [facility] Discharge Instructions  Please pick up your prescription for vitamin D you will only take this once a week. Please start taking vitamin B12 daily. Follow up in 4 months with labs    Thank you for choosing [place] at [facility] to provide your oncology and hematology care.  To afford each patient quality time with our provider, please arrive at least 15 minutes before your scheduled appointment time.   If you have a lab appointment with the [facility] please come in thru the Main Entrance and check in at the main information desk.  You need to re-schedule your appointment should you arrive 10 or more minutes late.  We strive to give you quality time with our providers, and arriving late affects you and other patients whose appointments are after yours.  Also, if you no show three or more times for appointments you may be dismissed from the clinic at the providers discretion.     Again, thank you for choosing [facility].  Our hope is that these requests will decrease the amount of time that you wait before being seen by our physicians.       _____________________________________________________________  Should you have questions after your visit to [facility], please contact our office at [phone] between the hours of 8:00 a.m. and 4:30 p.m.  Voicemails left after 4:00 p.m. will not be returned until the following business day.  For prescription refill requests, have your pharmacy contact our office and allow 72 hours.    Due to Covid, you will need to wear a mask upon entering the hospital. If you do not have a mask, a mask will be given to you at the Main Entrance upon arrival. For doctor visits, patients may have 1 support person with them. For treatment visits, patients can not have anyone with them due to social distancing guidelines and our immunocompromised population.

## 2019-07-11 NOTE — Assessment & Plan Note (Addendum)
1.  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  She also had a EGD and colonoscopy in 2016 did not show any evidence of active bleeding areas, however she had large internal hemorrhoids. -Last [name] infusion on 12/05/2018 and 12/12/2018. - He denies any bleeding per rectum but does occasionally have dark stools. -Labs on 07/04/2019 showed her hemoglobin 11.2, platelets 241, ferritin 291, percent saturation 19. -She reports she is still having fatigue and feels like she needs more iron.  She reports the iron infusions help her with energy greatly. -I have recommended her to get 2 more infusions of IV iron. -We will see her back in 4 months with repeat labs.  2.  Vitamin D deficiency: - Labs on 07/04/2019 showed her vitamin D level at 18.0 -I will prescribe her vitamin D 50,000 units weekly -I will check her labs at next visit.  3.  Borderline vitamin B12 deficiency: - Labs on 07/04/2019 showed her vitamin B 12 level at 212 - She will start taking vitamin B12 daily. -We will check her labs at next visit.

## 2019-07-11 NOTE — Progress Notes (Signed)
Melody Mitchell, [place] [address]   CLINIC:  Medical Oncology/Hematology  PCP:  [name], MD [address] [place]  [address] [phone]   REASON FOR VISIT: Follow-up for iron deficiency anemia  CURRENT THERAPY: Intermittent iron infusions   INTERVAL HISTORY:  Melody Mitchell 78 y.o. female returns for routine follow-up for iron deficiency anemia.  She reports she is still having fatigue and feels like she needs more iron.  She reports the iron infusions help her with her energy and completing her daily activities.  She denies any bright red bleeding per rectum or melena.  She denies any easy bruising.  She denies any headaches or blurred vision. Denies any nausea, vomiting, or diarrhea. Denies any new pains. Had not noticed any recent bleeding such as epistaxis, hematuria or hematochezia. Denies recent chest pain on exertion, shortness of breath on minimal exertion, pre-syncopal episodes, or palpitations. Denies any numbness or tingling in hands or feet. Denies any recent fevers, infections, or recent hospitalizations. Patient reports appetite at 75% and energy level at 75%. She is eating well and maintaining her weight at this time.    REVIEW OF SYSTEMS:  Review of Systems  Constitutional: Positive for fatigue.  Cardiovascular: Positive for leg swelling.  All other systems reviewed and are negative.    PAST MEDICAL/SURGICAL HISTORY:  Past Medical History:  Diagnosis Date  . Acid reflux   . Arthritis   . 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 Bronchitis january 2014  . Hiatal hernia   . High cholesterol   . History of contact dermatitis   . Hx: recurrent pneumonia   . Hypertension   . IDA (iron deficiency anemia)    chronic iron infusions  . Reflux   . Vertigo    Past Surgical History:  Procedure Laterality Date  . ABDOMINAL HYSTERECTOMY    . APPENDECTOMY    . BILATERAL BREAST BIOPSIES   BENIGN    . CHOLECYSTECTOMY    . COLONOSCOPY N/A 06/29/2015   SLF: The left colon is redundant 2. four colon polyps removed. No source  for change in bowel habits identified 3. Rectal bleeding due to large internal hemorrhoids   . COLONOSCOPY W/ BIOPSIES  6 2010   Dr. Oneida Alar: Polypoid appearing lesion of the ascending colon, 3 mm sessile rectal polyp, small internal hemorrhoids. Pathology revealed polypoid mucosa, no adenomatous changes  . Double-balloon enteroscopy, antegrade  April 2011   Dr. 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  Marland Kitchen ESOPHAGOGASTRODUODENOSCOPY  04/2009   Dr. Oneida Alar: Patent Schatzki ring dilated with advancing the scope, patchy erythema in the antrum, 2 small AVMs in the duodenal bulb, 2 additional AVMs noted in the second portion the duodenum, mild gastritis on pathology  . ESOPHAGOGASTRODUODENOSCOPY N/A 06/29/2015   SLF: 1. 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 small hiatal hernia 3. Mild non-erosive gastririts- NO obvious source for dyspepsia identified.   Marland Kitchen EYE SURGERY       SOCIAL HISTORY:  Social History   Socioeconomic History  . Marital status: Divorced    Spouse name: Not on file  . Number of children: 1  . Years of education: Not on file  . Highest education level: Not on file  Occupational History  . Not on file  Social Needs  . Financial resource strain: Not on file  . Food insecurity    Worry: Not on file  Inability: Not on file  . Transportation needs    Medical: Not on file    Non-medical: Not on file  Tobacco Use  . Smoking status: Former Smoker    Packs/day: 1.00    Years: 25.00    Pack years: 25.00    Types: Cigarettes    Quit date: 09/14/2007    Years since quitting: 11.8  . Smokeless tobacco: Never Used  Substance and Sexual Activity  . Alcohol use: Yes    Comment: occasional/rare (Brandy every now and then)  . Drug use: No  . Sexual activity: Yes    Birth control/protection:  Post-menopausal, Surgical  Lifestyle  . Physical activity    Days per week: Not on file    Minutes per session: Not on file  . Stress: Not on file  Relationships  . Social Herbalist on phone: Not on file    Gets together: Not on file    Attends religious service: Not on file    Active member of club or organization: Not on file    Attends meetings of clubs or organizations: Not on file    Relationship status: Not on file  . Intimate partner violence    Fear of current or ex partner: Not on file    Emotionally abused: Not on file    Physically abused: Not on file    Forced sexual activity: Not on file  Other Topics Concern  . Not on file  Social History Narrative  . Not on file    FAMILY HISTORY:  Family History  Problem Relation Age of Onset  . Heart failure Mother   . Diabetes Sister   . Heart attack Father   . Colon cancer Neg Hx     CURRENT MEDICATIONS:  Outpatient Encounter Medications as of 07/11/2019  Medication Sig  . COMBIGAN 0.2-0.5 % ophthalmic solution Place 1 drop into the left eye every 12 (twelve) hours.   Marland Kitchen esomeprazole (NEXIUM) 40 MG capsule Take 40 mg by mouth daily before breakfast.    . ezetimibe (ZETIA) 10 MG tablet Take 10 mg by mouth daily.   . fish oil-omega-3 fatty acids 1000 MG capsule Take 1 g by mouth daily.   . mometasone (ELOCON) 0.1 % ointment Apply 1 application topically daily as needed (for irritation).   . montelukast (SINGULAIR) 10 MG tablet Take 10 mg by mouth daily as needed (for allergies).   . Multiple Vitamin (MULTIVITAMIN) capsule Take 1 capsule by mouth daily.    . potassium chloride SA (K-DUR,KLOR-CON) 20 MEQ tablet Take 1 tablet (20 mEq total) by mouth 2 (two) times daily. (Patient taking differently: Take 20 mEq by mouth daily. )  . traMADol (ULTRAM) 50 MG tablet Take 50 mg by mouth daily as needed for moderate pain.   Marland Kitchen amitriptyline (ELAVIL) 25 MG tablet Take 25 mg by mouth at bedtime as needed for sleep.   .  clobetasol cream (TEMOVATE) 0.05 %   . diclofenac sodium (VOLTAREN) 1 % GEL Apply 2 g topically 4 (four) times daily.   Marland Kitchen docusate sodium (STOOL SOFTENER) 100 MG capsule Take 100 mg by mouth 2 (two) times daily as needed for mild constipation or moderate constipation.   . ergocalciferol (VITAMIN D2) 1.25 MG (50000 UT) capsule Take 1 capsule (50,000 Units total) by mouth once a week.  . fluticasone (FLONASE) 50 MCG/ACT nasal spray Place 2 sprays into both nostrils daily.   . meclizine (ANTIVERT) 25 MG tablet Take 1 tablet (  25 mg total) by mouth 3 (three) times daily as needed for dizziness. (Patient not taking: Reported on 07/11/2019)  . methylcellulose (ARTIFICIAL TEARS) 1 % ophthalmic solution Place 1 drop into both eyes 2 (two) times daily as needed (dry eyes).  . [DISCONTINUED] amitriptyline (ELAVIL) 25 MG tablet   . [DISCONTINUED] clobetasol cream (TEMOVATE) 0.05 %   . [DISCONTINUED] COMBIGAN 0.2-0.5 % ophthalmic solution   . [DISCONTINUED] diclofenac sodium (VOLTAREN) 1 % GEL   . [DISCONTINUED] escitalopram (LEXAPRO) 10 MG tablet Take 5 mg by mouth daily.   . [DISCONTINUED] escitalopram (LEXAPRO) 10 MG tablet   . [DISCONTINUED] esomeprazole (NEXIUM) 40 MG capsule   . [DISCONTINUED] ezetimibe (ZETIA) 10 MG tablet   . [DISCONTINUED] fluticasone (FLONASE) 50 MCG/ACT nasal spray   . [DISCONTINUED] montelukast (SINGULAIR) 10 MG tablet   . [DISCONTINUED] Potassium Chloride ER 20 MEQ TBCR   . [DISCONTINUED] predniSONE (DELTASONE) 20 MG tablet   . [DISCONTINUED] traMADol (ULTRAM) 50 MG tablet    No facility-administered encounter medications on file as of 07/11/2019.     ALLERGIES:  Allergies  Allergen Reactions  . Latex Hives, Itching and Rash  . Sulfa Antibiotics Hives and Itching  . Sulfonamide Derivatives Hives and Itching     PHYSICAL EXAM:  ECOG Performance status: 1  Vitals:   07/11/19 1153  BP: 130/80  Pulse: (!) 50  Resp: 18  Temp: (!) 97.5 F (36.4 C)  SpO2: 100%    Filed Weights   07/11/19 1153  Weight: 148 lb (67.1 kg)    Physical Exam Constitutional:      Appearance: Normal appearance. She is normal weight.  Cardiovascular:     Rate and Rhythm: Normal rate and regular rhythm.     Heart sounds: Normal heart sounds.  Pulmonary:     Effort: Pulmonary effort is normal.     Breath sounds: Normal breath sounds.  Abdominal:     General: Bowel sounds are normal.     Palpations: Abdomen is soft.  Musculoskeletal: Normal range of motion.  Skin:    General: Skin is warm and dry.  Neurological:     Mental Status: She is alert and oriented to person, place, and time. Mental status is at baseline.  Psychiatric:        Mood and Affect: Mood normal.        Behavior: Behavior normal.        Thought Content: Thought content normal.        Judgment: Judgment normal.      LABORATORY DATA:  I have reviewed the labs as listed.  CBC    Component Value Date/Time   WBC 3.8 (L) 07/04/2019 1038   RBC 3.53 (L) 07/04/2019 1038   HGB 11.2 (L) 07/04/2019 1038   HCT 35.0 (L) 07/04/2019 1038   PLT 241 07/04/2019 1038   MCV 99.2 07/04/2019 1038   MCH 31.7 07/04/2019 1038   MCHC 32.0 07/04/2019 1038   RDW 13.6 07/04/2019 1038   LYMPHSABS 1.2 07/04/2019 1038   MONOABS 0.5 07/04/2019 1038   EOSABS 0.1 07/04/2019 1038   BASOSABS 0.0 07/04/2019 1038   CMP Latest Ref Rng & Units 07/04/2019 04/03/2019 03/16/2019  Glucose 70 - 99 mg/dL 90 89 100(H)  BUN 8 - 23 mg/dL 18 11 8   Creatinine 0.44 - 1.00 mg/dL 0.98 0.93 0.81  Sodium 135 - 145 mmol/L 138 140 141  Potassium 3.5 - 5.1 mmol/L 4.1 4.1 4.2  Chloride 98 - 111 mmol/L 105 107 108  CO2  22 - 32 mmol/L 28 24 24   Calcium 8.9 - 10.3 mg/dL 9.0 8.8(L) 9.3  Total Protein 6.5 - 8.1 g/dL 7.3 6.8 7.2  Total Bilirubin 0.3 - 1.2 mg/dL 0.4 0.3 0.1(L)  Alkaline Phos 38 - 126 U/L 62 60 67  AST 15 - 41 U/L 19 21 21   ALT 0 - 44 U/L 13 14 12       I personally performed a face-to-face visit.  All questions were answered  to patient's stated satisfaction. Encouraged patient to call with any new concerns or questions before his next visit to the cancer center and we can certain see him sooner, if needed.     ASSESSMENT & PLAN:   Iron deficiency anemia due to chronic blood loss 1.  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  She also had a EGD and colonoscopy in 2016 did not show any evidence of active bleeding areas, however she had large internal hemorrhoids. -Last [name] infusion on 12/05/2018 and 12/12/2018. - He denies any bleeding per rectum but does occasionally have dark stools. -Labs on 07/04/2019 showed her hemoglobin 11.2, platelets 241, ferritin 291, percent saturation 19. -She reports she is still having fatigue and feels like she needs more iron.  She reports the iron infusions help her with energy greatly. -I have recommended her to get 2 more infusions of IV iron. -We will see her back in 4 months with repeat labs.  2.  Vitamin D deficiency: - Labs on 07/04/2019 showed her vitamin D level at 18.0 -I will prescribe her vitamin D 50,000 units weekly -I will check her labs at next visit.  3.  Borderline vitamin B12 deficiency: - Labs on 07/04/2019 showed her vitamin B 12 level at 212 - She will start taking vitamin B12 daily. -We will check her labs at next visit.      Orders placed this encounter:  Orders Placed This Encounter  Procedures  . Lactate dehydrogenase  . CBC with Differential/Platelet  . Comprehensive metabolic panel  . Ferritin  . Iron and TIBC  . Vitamin B12  . VITAMIN D 25 Hydroxy (Vit-D Deficiency, Fractures)  . Folate      Francene Finders, FNP-C [place] [phone]

## 2019-07-17 ENCOUNTER — Inpatient Hospital Stay (HOSPITAL_COMMUNITY): Payer: Medicare Other | Attending: Hematology

## 2019-07-17 ENCOUNTER — Other Ambulatory Visit: Payer: Self-pay

## 2019-07-17 ENCOUNTER — Encounter (HOSPITAL_COMMUNITY): Payer: Self-pay

## 2019-07-17 VITALS — BP 129/80 | HR 50 | Temp 96.8°F | Resp 18

## 2019-07-17 DIAGNOSIS — D500 Iron deficiency anemia secondary to blood loss (chronic): Secondary | ICD-10-CM | POA: Diagnosis not present

## 2019-07-17 DIAGNOSIS — E559 Vitamin D deficiency, unspecified: Secondary | ICD-10-CM | POA: Insufficient documentation

## 2019-07-17 DIAGNOSIS — K5520 Angiodysplasia of colon without hemorrhage: Secondary | ICD-10-CM | POA: Insufficient documentation

## 2019-07-17 MED ORDER — SODIUM CHLORIDE 0.9 % IV SOLN
510.0000 mg | Freq: Once | INTRAVENOUS | Status: AC
  Administered 2019-07-17: 510 mg via INTRAVENOUS
  Filled 2019-07-17: qty 510

## 2019-07-17 MED ORDER — SODIUM CHLORIDE 0.9 % IV SOLN
INTRAVENOUS | Status: DC
  Administered 2019-07-17: 14:00:00 via INTRAVENOUS

## 2019-07-17 NOTE — Patient Instructions (Signed)
[facility] Discharge Instructions  Received [name] infusion today. Follow-up as scheduled. Call clinic for any questions or concerns   Thank you for choosing [facility] at [facility] to provide your oncology and hematology care.  To afford each patient quality time with our provider, please arrive at least 15 minutes before your scheduled appointment time.   If you have a lab appointment with the Cancer Center please come in thru the Main Entrance and check in at the main information desk.  You need to re-schedule your appointment should you arrive 10 or more minutes late.  We strive to give you quality time with our providers, and arriving late affects you and other patients whose appointments are after yours.  Also, if you no show three or more times for appointments you may be dismissed from the clinic at the providers discretion.     Again, thank you for choosing [facility].  Our hope is that these requests will decrease the amount of time that you wait before being seen by our physicians.       _____________________________________________________________  Should you have questions after your visit to [facility], please contact our office at [phone] between the hours of 8:00 a.m. and 4:30 p.m.  Voicemails left after 4:00 p.m. will not be returned until the following business day.  For prescription refill requests, have your pharmacy contact our office and allow 72 hours.    Due to Covid, you will need to wear a mask upon entering the hospital. If you do not have a mask, a mask will be given to you at the Main Entrance upon arrival. For doctor visits, patients may have 1 support person with them. For treatment visits, patients can not have anyone with them due to social distancing guidelines and our immunocompromised population.     

## 2019-07-17 NOTE — Progress Notes (Signed)
Melody Mitchell tolerated [name] infusion well without complaints or incident. Peripheral IV site checked with positive blood return noted prior to and after infusion. VSS Pt discharged self ambulatory in satisfactory condition

## 2019-07-24 ENCOUNTER — Inpatient Hospital Stay (HOSPITAL_COMMUNITY): Payer: Medicare Other

## 2019-07-24 ENCOUNTER — Other Ambulatory Visit: Payer: Self-pay

## 2019-07-24 ENCOUNTER — Encounter (HOSPITAL_COMMUNITY): Payer: Self-pay

## 2019-07-24 VITALS — BP 123/80 | HR 60 | Temp 97.1°F | Resp 18

## 2019-07-24 DIAGNOSIS — D500 Iron deficiency anemia secondary to blood loss (chronic): Secondary | ICD-10-CM

## 2019-07-24 MED ORDER — SODIUM CHLORIDE 0.9 % IV SOLN
INTRAVENOUS | Status: DC
  Administered 2019-07-24: 13:00:00 via INTRAVENOUS

## 2019-07-24 MED ORDER — SODIUM CHLORIDE 0.9 % IV SOLN
510.0000 mg | Freq: Once | INTRAVENOUS | Status: AC
  Administered 2019-07-24: 510 mg via INTRAVENOUS
  Filled 2019-07-24: qty 510

## 2019-07-24 NOTE — Progress Notes (Signed)
[name] given per orders. Patient tolerated it well without problems. Vitals stable and discharged home from clinic ambulatory. Follow up as scheduled.  

## 2019-07-24 NOTE — Patient Instructions (Signed)
[facility]  Discharge Instructions:   _______________________________________________________________  Thank you for choosing [facility] at [facility] to provide your oncology and hematology care.  To afford each patient quality time with our providers, please arrive at least 15 minutes before your scheduled appointment.  You need to re-schedule your appointment if you arrive 10 or more minutes late.  We strive to give you quality time with our providers, and arriving late affects you and other patients whose appointments are after yours.  Also, if you no show three or more times for appointments you may be dismissed from the clinic.  Again, thank you for choosing [facility] at [facility]. Our hope is that these requests will allow you access to exceptional care and in a timely manner. _______________________________________________________________  If you have questions after your visit, please contact our office at [phone] between the hours of 8:30 a.m. and 5:00 p.m. Voicemails left after 4:30 p.m. will not be returned until the following business day. _______________________________________________________________  For prescription refill requests, have your pharmacy contact our office. _______________________________________________________________  Recommendations made by the consultant and any test results will be sent to your referring physician. _______________________________________________________________ 

## 2019-08-02 ENCOUNTER — Emergency Department (HOSPITAL_COMMUNITY)
Admission: EM | Admit: 2019-08-02 | Discharge: 2019-08-02 | Discharge status: Against Medical Advice | Payer: Medicare Other | Attending: Emergency Medicine | Admitting: Emergency Medicine

## 2019-08-02 ENCOUNTER — Other Ambulatory Visit: Payer: Self-pay

## 2019-08-02 ENCOUNTER — Encounter (HOSPITAL_COMMUNITY): Payer: Self-pay | Admitting: Emergency Medicine

## 2019-08-02 DIAGNOSIS — Z5321 Procedure and treatment not carried out due to patient leaving prior to being seen by health care provider: Secondary | ICD-10-CM | POA: Diagnosis not present

## 2019-08-02 DIAGNOSIS — R5381 Other malaise: Secondary | ICD-10-CM | POA: Diagnosis present

## 2019-08-02 MED ORDER — SODIUM CHLORIDE 0.9% FLUSH: 3.0000 mL | Freq: Once | INTRAVENOUS | Status: DC

## 2019-08-02 NOTE — ED Triage Notes (Addendum)
Patient brought in via [facility]. Called out for patient feeling ill. EMS reports once on scene patient was anxious and had elevated BP. No acute findings on EKG. Patient's BP came down en route. Denies pain or SOB

## 2019-08-04 ENCOUNTER — Emergency Department (HOSPITAL_COMMUNITY): Payer: Medicare Other

## 2019-08-04 ENCOUNTER — Other Ambulatory Visit: Payer: Self-pay

## 2019-08-04 ENCOUNTER — Emergency Department (HOSPITAL_COMMUNITY)
Admission: EM | Admit: 2019-08-04 | Discharge: 2019-08-04 | Disposition: A | Discharge status: Home / Self Care | Payer: Medicare Other | Attending: Emergency Medicine | Admitting: Emergency Medicine

## 2019-08-04 ENCOUNTER — Encounter (HOSPITAL_COMMUNITY): Payer: Self-pay | Admitting: Emergency Medicine

## 2019-08-04 DIAGNOSIS — R109 Unspecified abdominal pain: Secondary | ICD-10-CM | POA: Diagnosis present

## 2019-08-04 DIAGNOSIS — Z87891 Personal history of nicotine dependence: Secondary | ICD-10-CM | POA: Diagnosis not present

## 2019-08-04 DIAGNOSIS — Z91040 Latex allergy status: Secondary | ICD-10-CM | POA: Insufficient documentation

## 2019-08-04 DIAGNOSIS — K5900 Constipation, unspecified: Secondary | ICD-10-CM

## 2019-08-04 LAB — CBC WITH DIFFERENTIAL/PLATELET
Abs Immature Granulocytes: 0.01 10*3/uL (ref 0.00–0.07)
Basophils Absolute: 0 10*3/uL (ref 0.0–0.1)
Basophils Relative: 0 %
Eosinophils Absolute: 0.1 10*3/uL (ref 0.0–0.5)
Eosinophils Relative: 2 %
HCT: 40.7 % (ref 36.0–46.0)
Hemoglobin: 12.6 g/dL (ref 12.0–15.0)
Immature Granulocytes: 0 %
Lymphocytes Relative: 29 %
Lymphs Abs: 1.3 10*3/uL (ref 0.7–4.0)
MCH: 31 pg (ref 26.0–34.0)
MCHC: 31 g/dL (ref 30.0–36.0)
MCV: 100 fL (ref 80.0–100.0)
Monocytes Absolute: 0.5 10*3/uL (ref 0.1–1.0)
Monocytes Relative: 11 %
Neutro Abs: 2.7 10*3/uL (ref 1.7–7.7)
Neutrophils Relative %: 58 %
Platelets: 257 10*3/uL (ref 150–400)
RBC: 4.07 MIL/uL (ref 3.87–5.11)
RDW: 13.2 % (ref 11.5–15.5)
WBC: 4.6 10*3/uL (ref 4.0–10.5)
nRBC: 0 % (ref 0.0–0.2)

## 2019-08-04 LAB — COMPREHENSIVE METABOLIC PANEL
ALT: 22 U/L (ref 0–44)
AST: 25 U/L (ref 15–41)
Albumin: 4.1 g/dL (ref 3.5–5.0)
Alkaline Phosphatase: 76 U/L (ref 38–126)
Anion gap: 8 (ref 5–15)
BUN: 9 mg/dL (ref 8–23)
CO2: 24 mmol/L (ref 22–32)
Calcium: 9.4 mg/dL (ref 8.9–10.3)
Chloride: 108 mmol/L (ref 98–111)
Creatinine, Ser: 0.81 mg/dL (ref 0.44–1.00)
GFR calc Af Amer: >60 mL/min (ref >60)
GFR calc non Af Amer: >60 mL/min (ref >60)
Glucose, Bld: 81 mg/dL (ref 70–99)
Potassium: 4.6 mmol/L (ref 3.5–5.1)
Sodium: 140 mmol/L (ref 135–145)
Total Bilirubin: 0.3 mg/dL (ref 0.3–1.2)
Total Protein: 7.3 g/dL (ref 6.5–8.1)

## 2019-08-04 LAB — URINALYSIS, ROUTINE W REFLEX MICROSCOPIC
Bacteria, UA: NONE SEEN
Bilirubin Urine: NEGATIVE
Glucose, UA: NEGATIVE mg/dL
Hgb urine dipstick: NEGATIVE
Ketones, ur: 5 mg/dL — AB
Nitrite: NEGATIVE
Protein, ur: NEGATIVE mg/dL
Specific Gravity, Urine: 1.01 (ref 1.005–1.030)
pH: 6 (ref 5.0–8.0)

## 2019-08-04 LAB — TROPONIN I (HIGH SENSITIVITY)
Troponin I (High Sensitivity): 3 ng/L (ref <18)
Troponin I (High Sensitivity): 3 ng/L (ref <18)

## 2019-08-04 LAB — LIPASE, BLOOD: Lipase: 56 U/L — ABNORMAL HIGH (ref 11–51)

## 2019-08-04 MED ORDER — IOHEXOL 300 MG/ML  SOLN
100.0000 mL | Freq: Once | INTRAMUSCULAR | Status: AC | PRN
  Administered 2019-08-04: 100 mL via INTRAVENOUS

## 2019-08-04 MED ORDER — POLYETHYLENE GLYCOL 3350 17 G PO PACK: 17.0000 g | PACK | Freq: Every day | ORAL | 0 refills | Status: DC

## 2019-08-04 MED ORDER — SODIUM CHLORIDE 0.9 % IV BOLUS
1000.0000 mL | Freq: Once | INTRAVENOUS | Status: AC
  Administered 2019-08-04: 1000 mL via INTRAVENOUS

## 2019-08-04 MED ORDER — BISACODYL 10 MG RE SUPP
10.0000 mg | Freq: Once | RECTAL | Status: AC
  Administered 2019-08-04: 17:00:00 10 mg via RECTAL
  Filled 2019-08-04: qty 1

## 2019-08-04 NOTE — ED Provider Notes (Signed)
[facility] EMERGENCY DEPARTMENT Provider Note   CSN: [id number] Arrival date & time: 08/04/19  1156     History   Chief Complaint Chief Complaint  Patient presents with  . Abdominal Pain    HPI Melody Mitchell is a 78 y.o. female.     HPI   Patient is an 78 year old female with a history of GERD, AVM of the colon, hiatal hernia, hyperlipidemia, hypertension, iron deficiency anemia, vertigo, who presents to the emergency department today for evaluation of abdominal pain.  Patient states that she has diffuse abdominal discomfort/bloating for the last 2 days.  States she feels like she is constipated.  She took mag citrate yesterday and states that she "did not have good results ".  She denies any associated nausea/vomiting.  Denies any bloody stools or hematemesis.  States she has had decreased flatus today.  She also notes that today she had an episode where she felt lightheaded while she was trying to prepare food in the kitchen.  Episode resolved after she sat down for a few minutes.    Denies vertiginous symptoms.  Denies headaches, vision changes, speech difficulty, balance issues, not numbness/weakness.  Denies any chest pain, shortness of breath, palpitations, fevers, cough.  No urinary symptoms.  Denies any known coronavirus exposures.  Past Medical History:  Diagnosis Date  . Acid reflux   . Arthritis   . 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 Bronchitis january 2014  . Hiatal hernia   . High cholesterol   . History of contact dermatitis   . Hx: recurrent pneumonia   . Hypertension   . IDA (iron deficiency anemia)    chronic iron infusions  . Reflux   . Vertigo     Patient Active Problem List   Diagnosis Date Noted  . Abnormal CT of the abdomen 02/02/2017  . Rectal bleeding 02/02/2017  . Aortic atherosclerosis ([place]) 01/03/2017  . Colitis 01/02/2017  . Hypokalemia 01/02/2017  . Heme positive stool 06/10/2015  . Change in bowel  habits 06/10/2015  . Abdominal pain, epigastric 06/10/2015  . High cholesterol   . Acid reflux   . GERD 01/03/2011  . HEMORRHOIDS 04/15/2010  . Iron deficiency anemia due to chronic blood loss 01/12/2010  . 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 ABDOMINAL HYSTERECTOMY    . APPENDECTOMY    . BILATERAL BREAST BIOPSIES  BENIGN    . CHOLECYSTECTOMY    . COLONOSCOPY N/A 06/29/2015   SLF: The left colon is redundant 2. four colon polyps removed. No source  for change in bowel habits identified 3. Rectal bleeding due to large internal hemorrhoids   . COLONOSCOPY W/ BIOPSIES  6 2010   Dr. Oneida Alar: Polypoid appearing lesion of the ascending colon, 3 mm sessile rectal polyp, small internal hemorrhoids. Pathology revealed polypoid mucosa, no adenomatous changes  . Double-balloon enteroscopy, antegrade  April 2011   Dr. 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  Marland Kitchen ESOPHAGOGASTRODUODENOSCOPY  04/2009   Dr. Oneida Alar: Patent Schatzki ring dilated with advancing the scope, patchy erythema in the antrum, 2 small AVMs in the duodenal bulb, 2 additional AVMs noted in the second portion the duodenum, mild gastritis on pathology  . ESOPHAGOGASTRODUODENOSCOPY N/A 06/29/2015   SLF: 1. 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 small hiatal hernia 3. Mild non-erosive gastririts- NO obvious source for dyspepsia identified.   Marland Kitchen  EYE SURGERY       OB History    Gravida  2   Para  2   Term  2   Preterm      AB      Living  1     SAB      TAB      Ectopic      Multiple      Live Births               Home Medications    Prior to Admission medications   Medication Sig Start Date End Date Taking? Authorizing Provider  acetaminophen (TYLENOL) 500 MG tablet Take 1,000 mg by mouth every 6 (six) hours as needed.   Yes [provider]  amitriptyline (ELAVIL) 25 MG tablet Take  25 mg by mouth at bedtime as needed for sleep.    Yes [provider]  clobetasol cream (TEMOVATE) 0.05 %  04/01/19  Yes [provider]  COMBIGAN 0.2-0.5 % ophthalmic solution Place 1 drop into the left eye every 12 (twelve) hours.  10/08/18  Yes [provider]  docusate sodium (STOOL SOFTENER) 100 MG capsule Take 100 mg by mouth 2 (two) times daily as needed for mild constipation or moderate constipation.    Yes [provider]  ergocalciferol (VITAMIN D2) 1.25 MG (50000 UT) capsule Take 1 capsule (50,000 Units total) by mouth once a week. 07/11/19  Yes Lockamy, Randi L, NP-C  esomeprazole (NEXIUM) 40 MG capsule Take 40 mg by mouth daily before breakfast.     Yes [provider]  fish oil-omega-3 fatty acids 1000 MG capsule Take 1 g by mouth daily.    Yes [provider]  fluticasone (FLONASE) 50 MCG/ACT nasal spray Place 2 sprays into both nostrils daily.  04/11/13  Yes [provider]  meclizine (ANTIVERT) 25 MG tablet Take 1 tablet (25 mg total) by mouth 3 (three) times daily as needed for dizziness. 09/13/16  Yes Rolland Porter, MD  methylcellulose (ARTIFICIAL TEARS) 1 % ophthalmic solution Place 1 drop into both eyes 2 (two) times daily as needed (dry eyes).   Yes [provider]  mometasone (ELOCON) 0.1 % ointment Apply 1 application topically daily as needed (for irritation).  04/30/18  Yes [provider]  montelukast (SINGULAIR) 10 MG tablet Take 10 mg by mouth daily as needed (for allergies).    Yes [provider]  Multiple Vitamin (MULTIVITAMIN) capsule Take 1 capsule by mouth daily.     Yes [provider]  potassium chloride SA (K-DUR,KLOR-CON) 20 MEQ tablet Take 1 tablet (20 mEq total) by mouth 2 (two) times daily. Patient taking differently: Take 20 mEq by mouth daily.  02/27/17  Yes Kefalas, Manon Hilding, PA-C  traMADol (ULTRAM) 50 MG tablet Take 50 mg by mouth daily as needed for moderate pain.   09/24/13  Yes [provider]  polyethylene glycol (MIRALAX) 17 g packet Take 17 g by mouth daily. Dissolve one cap full in solution (water, gatorade, etc.) and administer once cap-full daily. You may titrate up daily by 1 cap-full until the patient is having pudding consistency of stools. After the patient is able to start passing softer stools they will need to be on 1/2 cap-full daily for 2 weeks. 08/04/19   Anzlee Hinesley S, PA-C    Family History Family History  Problem Relation Age of Onset  . Heart failure Mother   . Diabetes Sister   . Heart attack Father   .  Colon cancer Neg Hx     Social History Social History   Tobacco Use  . Smoking status: Former Smoker    Packs/day: 1.00    Years: 25.00    Pack years: 25.00    Types: Cigarettes    Quit date: 09/14/2007    Years since quitting: 11.8  . Smokeless tobacco: Never Used  Substance Use Topics  . Alcohol use: Yes    Comment: occasional/rare (Brandy every now and then)  . Drug use: No     Allergies   Latex, Sulfa antibiotics, and Sulfonamide derivatives   Review of Systems Review of Systems  Constitutional: Negative for chills and fever.  HENT: Negative for ear pain and sore throat.   Eyes: Negative for visual disturbance.  Respiratory: Negative for cough and shortness of breath.   Cardiovascular: Negative for chest pain and palpitations.  Gastrointestinal: Positive for abdominal pain and constipation. Negative for blood in stool, diarrhea, nausea and vomiting.  Genitourinary: Negative for dysuria and hematuria.  Musculoskeletal: Negative for back pain.  Skin: Negative for rash.  Neurological: Positive for light-headedness (resolved). Negative for dizziness, weakness, numbness and headaches.  All other systems reviewed and are negative.    Physical Exam Updated Vital Signs BP (!) 147/108 (BP Location: Right Arm)   Pulse 70   Temp 98.3 F (36.8 C) (Rectal)   Resp 17   Ht 5\' 4"  (1.626 m)   Wt 64  kg   SpO2 96%   BMI 24.20 kg/m   Physical Exam Vitals signs and nursing note reviewed.  Constitutional:      General: She is not in acute distress.    Appearance: She is well-developed.  HENT:     Head: Normocephalic and atraumatic.  Eyes:     Extraocular Movements: Extraocular movements intact.     Conjunctiva/sclera: Conjunctivae normal.     Pupils: Pupils are equal, round, and reactive to light.     Comments: No nystagmus  Neck:     Musculoskeletal: Neck supple.  Cardiovascular:     Rate and Rhythm: Normal rate and regular rhythm.     Heart sounds: Normal heart sounds. No murmur.  Pulmonary:     Effort: Pulmonary effort is normal. No respiratory distress.     Breath sounds: Examination of the right-lower field reveals rales. Examination of the left-lower field reveals rales. Rales (faint) present. No wheezing or rhonchi.  Abdominal:     General: Bowel sounds are increased.     Palpations: Abdomen is soft.     Tenderness: There is no right CVA tenderness, left CVA tenderness, guarding or rebound.     Comments: Minimal luq ttp  Musculoskeletal:     Comments: Trace ble edema, no calf ttp  Skin:    General: Skin is warm and dry.  Neurological:     Mental Status: She is alert.     Comments: Mental Status:  Alert, thought content appropriate, able to give a coherent history. Speech fluent without evidence of aphasia. Able to follow 2 step commands without difficulty.  Cranial Nerves:  II: pupils equal, round, reactive to light III,IV, VI: ptosis not present, extra-ocular motions intact bilaterally  V,VII: smile symmetric, facial light touch sensation equal VIII: hearing grossly normal to voice  X: uvula elevates symmetrically  XI: bilateral shoulder shrug symmetric and strong XII: midline tongue extension without fassiculations Motor:  Normal tone. 5/5 strength of BUE and BLE major muscle groups including strong and equal grip strength and dorsiflexion/plantar flexion  Sensory:  light touch normal in all extremities.  CV: 2+ radial and DP pulses Negative pronator drift     ED Treatments / Results  Labs (all labs ordered are listed, but only abnormal results are displayed) Labs Reviewed  LIPASE, BLOOD - Abnormal; Notable for the following components:      Result Value   Lipase 56 (*)    All other components within normal limits  URINALYSIS, ROUTINE W REFLEX MICROSCOPIC - Abnormal; Notable for the following components:   Color, Urine STRAW (*)    Ketones, ur 5 (*)    Leukocytes,Ua SMALL (*)    All other components within normal limits  URINE CULTURE  CBC WITH DIFFERENTIAL/PLATELET  COMPREHENSIVE METABOLIC PANEL  TROPONIN I (HIGH SENSITIVITY)  TROPONIN I (HIGH SENSITIVITY)    EKG EKG Interpretation  Date/Time:  Sunday August 04 2019 13:06:28 EDT Ventricular Rate:  59 PR Interval:    QRS Duration: 92 QT Interval:  400 QTC Calculation: 397 R Axis:   -19 Text Interpretation:  Sinus rhythm Borderline left axis deviation Low voltage, precordial leads Abnormal R-wave progression, early transition Nonspecific T abnormalities, anterior leads When compared with ECG of 08/02/2019 No significant change was found Confirmed by Francine Graven [phone]) on 08/04/2019 4:44:41 PM   Radiology Ct Abdomen Pelvis W Contrast  Result Date: 08/04/2019 CLINICAL DATA:  Diffuse abdominal pain/bloating, constipation. EXAM: CT ABDOMEN AND PELVIS WITH CONTRAST TECHNIQUE: Multidetector CT imaging of the abdomen and pelvis was performed using the standard protocol following bolus administration of intravenous contrast. CONTRAST:  131mL OMNIPAQUE IOHEXOL 300 MG/ML  SOLN COMPARISON:  CT abdomen dated 11/14/2017. FINDINGS: Lower chest: No acute abnormality. Hepatobiliary: No focal liver abnormality is seen. Status post cholecystectomy. No biliary dilatation. Pancreas: Unremarkable. No pancreatic ductal dilatation or surrounding inflammatory changes. Spleen: Normal in size  without focal abnormality. Adrenals/Urinary Tract: Adrenal glands appear normal. Kidneys are unremarkable without mass, stone or hydronephrosis. No ureteral or bladder calculi identified. Bladder appears normal. Stomach/Bowel: No dilated large or small bowel loops. No evidence of bowel wall inflammation. Stomach is unremarkable, partially decompressed. Appendix is not seen but there are no inflammatory changes about the cecum to suggest acute appendicitis. Vascular/Lymphatic: Aortic atherosclerosis. No acute appearing vascular abnormality. No enlarged lymph nodes seen in the abdomen or pelvis. Reproductive: Presumed hysterectomy. No adnexal mass or free fluid seen. Other: No free fluid or abscess collection. No free intraperitoneal air. Musculoskeletal: No acute or suspicious osseous finding. Degenerative spondylosis of the slightly scoliotic thoracolumbar spine, mild to moderate in degree. IMPRESSION: 1. No acute findings within the abdomen or pelvis. No bowel obstruction or evidence of bowel wall inflammation. No free fluid or inflammatory change. No evidence of acute solid organ abnormality. No renal or ureteral calculi. 2. Aortic atherosclerosis. Aortic Atherosclerosis (ICD10-I70.0). Electronically Signed   By: Franki Cabot M.D.   On: 08/04/2019 15:26    Procedures Procedures (including critical care time)  Medications Ordered in ED Medications  sodium chloride 0.9 % bolus 1,000 mL (0 mLs Intravenous Stopped 08/04/19 1445)  iohexol (OMNIPAQUE) 300 MG/ML solution 100 mL (100 mLs Intravenous Contrast Given 08/04/19 1502)  bisacodyl (DULCOLAX) suppository 10 mg (10 mg Rectal Given 08/04/19 1648)     Initial Impression / Assessment and Plan / ED Course  I have reviewed the triage vital signs and the nursing notes.  Pertinent labs & imaging results that were available during my care of the patient were reviewed by me and considered in my medical decision making (see chart for details).  Final  Clinical Impressions(s) / ED Diagnoses   Final diagnoses:  Abdominal pain, unspecified abdominal location  Constipation, unspecified constipation type   78 year old female presenting with abdominal pain and constipation.  Also with episode of lightheadedness today upon standing.  No vertiginous symptoms.  No other neuro complaints.  On initial eval, patient afebrile.  Mildly hypertensive but otherwise normal vitals. Orthostatics are negative. Neuro exam is nonfocal.  Abdomen with minimal tenderness to the left upper quadrant without guarding or rebound tenderness.  Hyperactive bowel sounds present.  Faint rales to the bilateral lower lobes.  Heart with regular rate and rhythm.  Reviewed records in care everywhere. Prior echo @ [facility] in 11/2013 showed preserved EF.  CBC WNL CMP WNL Lipase marginally elevated which she has similar hx of Troponin negative x2 UA with mild ketonuria, small leuks and 6-10 wbc. No bacteria seen therefore lower suspicion for UTI at this point but will culture urine.   EKG NSR Borderline left axis, deviation Low voltage, precordial leads Abnormal R-wave progression, early transition Nonspecific T abnormalities, anterior leads When compared with ECG of 08/02/2019 No significant change was found   CT abdomen/pelvis with no acute findings within the abdomen or pelvis. No bowel obstruction or evidence of bowel wall inflammation. No free fluid or inflammatory change. No evidence of acute solid organ abnormality. No renal or ureteral calculi. Aortic atherosclerosis.  Patient given 1 L IV fluids and given a dose of dulcolax. On reassessment she states she feels somewhat improved and was able to have a small BM. I suspect her abd bloating may be secondary to constipation we will start her on MiraLAX.  With regard to her episode of lightheadedness.  This is transient and associated with going from sitting to standing.  She not had of any vertiginous symptoms or persistent symptoms.   Her neurologic exam is normal.  I have low suspicion for acute CVA or other intracranial pathology.  This may have been an episode of orthostatic hypotension.  Advised her to stay hydrated at home.  Advise close follow-up with PCP and return to the ED for new or worsening symptoms.  She voiced understand the plan and reasons to return.  All questions answered.  Patient stable for discharge.   ED Discharge Orders         Ordered    polyethylene glycol (MIRALAX) 17 g packet  Daily     08/04/19 1754           Braydin Aloi S, PA-C 08/04/19 Dierks, [place], DO 08/08/19 1603

## 2019-08-04 NOTE — ED Triage Notes (Signed)
Patient brought in via EMS from home. Alert and oriented. Airway patent. Patient c/o abd pain/bloating with nausea and constipation. Denies any vomiting. Patient states this morning she did have a small liquid BM. Denies any blood. Patient also c/o dizziness with movement in which she came to ED for yesterday and waited in waiting room but left before being seen by MD. Per patient dizziness started yesterday but unable to give accurate time.

## 2019-08-04 NOTE — ED Notes (Signed)
Patient tolerating water and crackers.

## 2019-08-04 NOTE — Discharge Instructions (Signed)
Take medications as prescribed.   Make sure to stay well hydrated.   Please follow up with your primary doctor within the next 3-5 days. Please return to the ER sooner if you have any new or worsening symptoms, or if you have any of the following symptoms:  Abdominal pain that does not go away.  You have a fever.  You keep throwing up (vomiting).  The pain is felt only in portions of the abdomen. Pain in the right side could possibly be appendicitis. In an adult, pain in the left lower portion of the abdomen could be colitis or diverticulitis.  You pass bloody or black tarry stools.  There is bright red blood in the stool.  The constipation stays for more than 4 days.  There is belly (abdominal) or rectal pain.  You do not seem to be getting better.  You have any questions or concerns.

## 2019-08-06 LAB — URINE CULTURE: Culture: <10000 — AB

## 2019-08-19 ENCOUNTER — Other Ambulatory Visit (HOSPITAL_COMMUNITY): Payer: Self-pay | Admitting: Nurse Practitioner

## 2019-08-19 DIAGNOSIS — E559 Vitamin D deficiency, unspecified: Secondary | ICD-10-CM

## 2019-09-05 ENCOUNTER — Inpatient Hospital Stay (HOSPITAL_COMMUNITY): Payer: Medicare Other

## 2019-09-05 ENCOUNTER — Emergency Department (HOSPITAL_COMMUNITY): Payer: Medicare Other

## 2019-09-05 ENCOUNTER — Other Ambulatory Visit: Payer: Self-pay

## 2019-09-05 ENCOUNTER — Encounter (HOSPITAL_COMMUNITY): Payer: Self-pay

## 2019-09-05 ENCOUNTER — Inpatient Hospital Stay (HOSPITAL_COMMUNITY)
Admission: EM | Admit: 2019-09-05 | Discharge: 2019-09-06 | DRG: 065 | Disposition: A | Discharge status: Home Health | Payer: Medicare Other | Attending: Family Medicine | Admitting: Family Medicine

## 2019-09-05 DIAGNOSIS — N390 Urinary tract infection, site not specified: Secondary | ICD-10-CM | POA: Diagnosis present

## 2019-09-05 DIAGNOSIS — I10 Essential (primary) hypertension: Secondary | ICD-10-CM | POA: Diagnosis present

## 2019-09-05 DIAGNOSIS — Z8249 Family history of ischemic heart disease and other diseases of the circulatory system: Secondary | ICD-10-CM | POA: Diagnosis not present

## 2019-09-05 DIAGNOSIS — E7800 Pure hypercholesterolemia, unspecified: Secondary | ICD-10-CM | POA: Diagnosis present

## 2019-09-05 DIAGNOSIS — K219 Gastro-esophageal reflux disease without esophagitis: Secondary | ICD-10-CM | POA: Diagnosis present

## 2019-09-05 DIAGNOSIS — Z79899 Other long term (current) drug therapy: Secondary | ICD-10-CM | POA: Diagnosis not present

## 2019-09-05 DIAGNOSIS — I639 Cerebral infarction, unspecified: Secondary | ICD-10-CM | POA: Diagnosis present

## 2019-09-05 DIAGNOSIS — I700 Atherosclerosis of aorta: Secondary | ICD-10-CM | POA: Diagnosis present

## 2019-09-05 DIAGNOSIS — I6330 Cerebral infarction due to thrombosis of unspecified cerebral artery: Secondary | ICD-10-CM | POA: Diagnosis not present

## 2019-09-05 DIAGNOSIS — K5520 Angiodysplasia of colon without hemorrhage: Secondary | ICD-10-CM | POA: Diagnosis present

## 2019-09-05 DIAGNOSIS — E785 Hyperlipidemia, unspecified: Secondary | ICD-10-CM | POA: Diagnosis present

## 2019-09-05 DIAGNOSIS — Z91040 Latex allergy status: Secondary | ICD-10-CM

## 2019-09-05 DIAGNOSIS — R531 Weakness: Secondary | ICD-10-CM

## 2019-09-05 DIAGNOSIS — B9689 Other specified bacterial agents as the cause of diseases classified elsewhere: Secondary | ICD-10-CM | POA: Diagnosis present

## 2019-09-05 DIAGNOSIS — N3000 Acute cystitis without hematuria: Secondary | ICD-10-CM | POA: Diagnosis not present

## 2019-09-05 DIAGNOSIS — R29700 NIHSS score 0: Secondary | ICD-10-CM | POA: Diagnosis present

## 2019-09-05 DIAGNOSIS — Z882 Allergy status to sulfonamides status: Secondary | ICD-10-CM | POA: Diagnosis not present

## 2019-09-05 DIAGNOSIS — M1990 Unspecified osteoarthritis, unspecified site: Secondary | ICD-10-CM | POA: Diagnosis present

## 2019-09-05 DIAGNOSIS — Z87891 Personal history of nicotine dependence: Secondary | ICD-10-CM

## 2019-09-05 DIAGNOSIS — Z20828 Contact with and (suspected) exposure to other viral communicable diseases: Secondary | ICD-10-CM | POA: Diagnosis present

## 2019-09-05 DIAGNOSIS — R55 Syncope and collapse: Secondary | ICD-10-CM | POA: Diagnosis not present

## 2019-09-05 DIAGNOSIS — E876 Hypokalemia: Secondary | ICD-10-CM

## 2019-09-05 DIAGNOSIS — I6389 Other cerebral infarction: Secondary | ICD-10-CM | POA: Diagnosis not present

## 2019-09-05 DIAGNOSIS — Z8701 Personal history of pneumonia (recurrent): Secondary | ICD-10-CM

## 2019-09-05 LAB — CBC
HCT: 39.8 % (ref 36.0–46.0)
Hemoglobin: 12.2 g/dL (ref 12.0–15.0)
MCH: 30.7 pg (ref 26.0–34.0)
MCHC: 30.7 g/dL (ref 30.0–36.0)
MCV: 100.3 fL — ABNORMAL HIGH (ref 80.0–100.0)
Platelets: 285 10*3/uL (ref 150–400)
RBC: 3.97 MIL/uL (ref 3.87–5.11)
RDW: 13.1 % (ref 11.5–15.5)
WBC: 4.1 10*3/uL (ref 4.0–10.5)
nRBC: 0 % (ref 0.0–0.2)

## 2019-09-05 LAB — COMPREHENSIVE METABOLIC PANEL
ALT: 19 U/L (ref 0–44)
AST: 22 U/L (ref 15–41)
Albumin: 4.2 g/dL (ref 3.5–5.0)
Alkaline Phosphatase: 81 U/L (ref 38–126)
Anion gap: 8 (ref 5–15)
BUN: 9 mg/dL (ref 8–23)
CO2: 27 mmol/L (ref 22–32)
Calcium: 9.7 mg/dL (ref 8.9–10.3)
Chloride: 104 mmol/L (ref 98–111)
Creatinine, Ser: 0.93 mg/dL (ref 0.44–1.00)
GFR calc Af Amer: >60 mL/min (ref >60)
GFR calc non Af Amer: 58 mL/min — ABNORMAL LOW (ref >60)
Glucose, Bld: 91 mg/dL (ref 70–99)
Potassium: 4.5 mmol/L (ref 3.5–5.1)
Sodium: 139 mmol/L (ref 135–145)
Total Bilirubin: 0.4 mg/dL (ref 0.3–1.2)
Total Protein: 7.6 g/dL (ref 6.5–8.1)

## 2019-09-05 LAB — URINALYSIS, ROUTINE W REFLEX MICROSCOPIC
Bacteria, UA: NONE SEEN
Bilirubin Urine: NEGATIVE
Glucose, UA: NEGATIVE mg/dL
Hgb urine dipstick: NEGATIVE
Ketones, ur: NEGATIVE mg/dL
Nitrite: NEGATIVE
Protein, ur: NEGATIVE mg/dL
Specific Gravity, Urine: 1.009 (ref 1.005–1.030)
pH: 6 (ref 5.0–8.0)

## 2019-09-05 LAB — TROPONIN I (HIGH SENSITIVITY)
Troponin I (High Sensitivity): 3 ng/L (ref <18)
Troponin I (High Sensitivity): 3 ng/L (ref <18)

## 2019-09-05 LAB — MAGNESIUM: Magnesium: 2.3 mg/dL (ref 1.7–2.4)

## 2019-09-05 LAB — TSH: TSH: 0.894 u[IU]/mL (ref 0.350–4.500)

## 2019-09-05 MED ORDER — POLYVINYL ALCOHOL 1.4 % OP SOLN: 1.0000 [drp] | OPHTHALMIC | Status: DC | PRN

## 2019-09-05 MED ORDER — ACETAMINOPHEN 325 MG PO TABS
650.0000 mg | ORAL_TABLET | Freq: Four times a day (QID) | ORAL | Status: DC | PRN
  Administered 2019-09-05 – 2019-09-06 (×2): 650 mg via ORAL
  Filled 2019-09-05 (×2): qty 2

## 2019-09-05 MED ORDER — SODIUM CHLORIDE 0.9 % IV SOLN: 250.0000 mL | INTRAVENOUS | Status: DC | PRN

## 2019-09-05 MED ORDER — OMEGA-3-ACID ETHYL ESTERS 1 G PO CAPS
1.0000 g | ORAL_CAPSULE | Freq: Every day | ORAL | Status: DC
  Administered 2019-09-06: 1 g via ORAL
  Filled 2019-09-05: qty 1

## 2019-09-05 MED ORDER — ACETAMINOPHEN 650 MG RE SUPP: 650.0000 mg | Freq: Four times a day (QID) | RECTAL | Status: DC | PRN

## 2019-09-05 MED ORDER — SODIUM CHLORIDE 0.9% FLUSH
3.0000 mL | Freq: Two times a day (BID) | INTRAVENOUS | Status: DC
  Administered 2019-09-05 – 2019-09-06 (×2): 3 mL via INTRAVENOUS

## 2019-09-05 MED ORDER — ATORVASTATIN CALCIUM 40 MG PO TABS
80.0000 mg | ORAL_TABLET | Freq: Every day | ORAL | Status: DC
  Administered 2019-09-06: 80 mg via ORAL
  Filled 2019-09-05 (×2): qty 2

## 2019-09-05 MED ORDER — POLYETHYLENE GLYCOL 3350 17 G PO PACK
17.0000 g | PACK | Freq: Every day | ORAL | Status: DC | PRN
  Administered 2019-09-06: 17 g via ORAL
  Filled 2019-09-05: qty 1

## 2019-09-05 MED ORDER — TRAZODONE HCL 50 MG PO TABS: 50.0000 mg | ORAL_TABLET | Freq: Every evening | ORAL | Status: DC | PRN

## 2019-09-05 MED ORDER — POTASSIUM CHLORIDE CRYS ER 20 MEQ PO TBCR
20.0000 meq | EXTENDED_RELEASE_TABLET | Freq: Two times a day (BID) | ORAL | Status: DC
  Administered 2019-09-05 – 2019-09-06 (×2): 20 meq via ORAL
  Filled 2019-09-05 (×2): qty 1

## 2019-09-05 MED ORDER — ONDANSETRON HCL 4 MG/2ML IJ SOLN: 4.0000 mg | Freq: Four times a day (QID) | INTRAMUSCULAR | Status: DC | PRN

## 2019-09-05 MED ORDER — ASPIRIN 81 MG PO CHEW
81.0000 mg | CHEWABLE_TABLET | Freq: Every day | ORAL | Status: DC
  Administered 2019-09-06: 81 mg via ORAL
  Filled 2019-09-05: qty 1

## 2019-09-05 MED ORDER — HEPARIN SODIUM (PORCINE) 5000 UNIT/ML IJ SOLN
5000.0000 [IU] | Freq: Three times a day (TID) | INTRAMUSCULAR | Status: DC
  Administered 2019-09-05 – 2019-09-06 (×3): 5000 [IU] via SUBCUTANEOUS
  Filled 2019-09-05 (×3): qty 1

## 2019-09-05 MED ORDER — ACETAMINOPHEN 500 MG PO TABS: 1000.0000 mg | ORAL_TABLET | Freq: Four times a day (QID) | ORAL | Status: DC | PRN

## 2019-09-05 MED ORDER — ADULT MULTIVITAMIN W/MINERALS CH
1.0000 | ORAL_TABLET | Freq: Every day | ORAL | Status: DC
  Administered 2019-09-06: 1 via ORAL
  Filled 2019-09-05: qty 1

## 2019-09-05 MED ORDER — PANTOPRAZOLE SODIUM 40 MG PO TBEC
40.0000 mg | DELAYED_RELEASE_TABLET | Freq: Every day | ORAL | Status: DC
  Administered 2019-09-06: 40 mg via ORAL
  Filled 2019-09-05: qty 1

## 2019-09-05 MED ORDER — OMEGA-3 FATTY ACIDS 1000 MG PO CAPS: 1.0000 g | ORAL_CAPSULE | Freq: Every day | ORAL | Status: DC

## 2019-09-05 MED ORDER — VITAMIN D (ERGOCALCIFEROL) 1.25 MG (50000 UNIT) PO CAPS: 50000.0000 [IU] | ORAL_CAPSULE | ORAL | Status: DC

## 2019-09-05 MED ORDER — SODIUM CHLORIDE 0.9% FLUSH: 3.0000 mL | INTRAVENOUS | Status: DC | PRN

## 2019-09-05 MED ORDER — FLUTICASONE PROPIONATE 50 MCG/ACT NA SUSP
2.0000 | Freq: Every day | NASAL | Status: DC
  Administered 2019-09-06: 2 via NASAL
  Filled 2019-09-05: qty 16

## 2019-09-05 MED ORDER — MULTIVITAMINS PO CAPS: 1.0000 | ORAL_CAPSULE | Freq: Every day | ORAL | Status: DC

## 2019-09-05 MED ORDER — ALBUTEROL SULFATE (2.5 MG/3ML) 0.083% IN NEBU: 2.5000 mg | INHALATION_SOLUTION | RESPIRATORY_TRACT | Status: DC | PRN

## 2019-09-05 MED ORDER — BRIMONIDINE TARTRATE-TIMOLOL 0.2-0.5 % OP SOLN
1.0000 [drp] | Freq: Two times a day (BID) | OPHTHALMIC | Status: DC
  Filled 2019-09-05: qty 5

## 2019-09-05 MED ORDER — AMITRIPTYLINE HCL 25 MG PO TABS: 25.0000 mg | ORAL_TABLET | Freq: Every evening | ORAL | Status: DC | PRN

## 2019-09-05 MED ORDER — ASPIRIN 81 MG PO CHEW
324.0000 mg | CHEWABLE_TABLET | Freq: Once | ORAL | Status: AC
  Administered 2019-09-05: 324 mg via ORAL
  Filled 2019-09-05: qty 4

## 2019-09-05 MED ORDER — ONDANSETRON HCL 4 MG PO TABS: 4.0000 mg | ORAL_TABLET | Freq: Four times a day (QID) | ORAL | Status: DC | PRN

## 2019-09-05 MED ORDER — METHYLCELLULOSE 1 % OP SOLN: 1.0000 [drp] | Freq: Two times a day (BID) | OPHTHALMIC | Status: DC | PRN

## 2019-09-05 NOTE — ED Provider Notes (Signed)
[facility] EMERGENCY DEPARTMENT Provider Note   CSN: [id number] Arrival date & time: 09/05/19  1038     History   Chief Complaint Chief Complaint  Patient presents with  . Weakness    HPI Melody Mitchell is a 78 y.o. female.     Patient with history of acid reflux, AV malformation of the colon, pneumonia, iron deficiency anemia presents with general weakness and lightheadedness.  Symptoms for the past 2 to 3 days.  Patient has not noticed gross blood in the stools.  No focality of weakness.  No fevers chills or respiratory symptoms.  No new medications.  Patient has taken 1 less potassium pill daily as was prescribed.  Patient had breakfast this morning.     Past Medical History:  Diagnosis Date  . Acid reflux   . Arthritis   . 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 Bronchitis january 2014  . Hiatal hernia   . High cholesterol   . History of contact dermatitis   . Hx: recurrent pneumonia   . Hypertension   . IDA (iron deficiency anemia)    chronic iron infusions  . Reflux   . Vertigo     Patient Active Problem List   Diagnosis Date Noted  . 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 Abnormal CT of the abdomen 02/02/2017  . Rectal bleeding 02/02/2017  . Aortic atherosclerosis ([place]) 01/03/2017  . Colitis 01/02/2017  . Hypokalemia 01/02/2017  . Heme positive stool 06/10/2015  . Change in bowel habits 06/10/2015  . Abdominal pain, epigastric 06/10/2015  . High cholesterol   . Acid reflux   . GERD 01/03/2011  . HEMORRHOIDS 04/15/2010  . Iron deficiency anemia due to chronic blood loss 01/12/2010  . 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 ABDOMINAL HYSTERECTOMY    . APPENDECTOMY    . BILATERAL BREAST BIOPSIES  BENIGN    . CHOLECYSTECTOMY    . COLONOSCOPY N/A 06/29/2015   SLF: The left colon is redundant 2. four colon polyps removed. No source  for change  in bowel habits identified 3. Rectal bleeding due to large internal hemorrhoids   . COLONOSCOPY W/ BIOPSIES  6 2010   Dr. Oneida Alar: Polypoid appearing lesion of the ascending colon, 3 mm sessile rectal polyp, small internal hemorrhoids. Pathology revealed polypoid mucosa, no adenomatous changes  . Double-balloon enteroscopy, antegrade  April 2011   Dr. 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  Marland Kitchen ESOPHAGOGASTRODUODENOSCOPY  04/2009   Dr. Oneida Alar: Patent Schatzki ring dilated with advancing the scope, patchy erythema in the antrum, 2 small AVMs in the duodenal bulb, 2 additional AVMs noted in the second portion the duodenum, mild gastritis on pathology  . ESOPHAGOGASTRODUODENOSCOPY N/A 06/29/2015   SLF: 1. 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 small hiatal hernia 3. Mild non-erosive gastririts- NO obvious source for dyspepsia identified.   Marland Kitchen EYE SURGERY       OB History    Gravida  2   Para  2   Term  2   Preterm      AB      Living  1     SAB      TAB      Ectopic      Multiple      Live Births  Home Medications    Prior to Admission medications   Medication Sig Start Date End Date Taking? Authorizing Provider  acetaminophen (TYLENOL) 500 MG tablet Take 1,000 mg by mouth every 6 (six) hours as needed.   Yes [provider]  amitriptyline (ELAVIL) 25 MG tablet Take 25 mg by mouth at bedtime as needed for sleep.    Yes [provider]  clobetasol cream (TEMOVATE) 0.05 %  04/01/19  Yes [provider]  COMBIGAN 0.2-0.5 % ophthalmic solution Place 1 drop into the left eye every 12 (twelve) hours.  10/08/18  Yes [provider]  esomeprazole (NEXIUM) 40 MG capsule Take 40 mg by mouth daily before breakfast.     Yes [provider]  fish oil-omega-3 fatty acids 1000 MG capsule Take 1 g by mouth daily.    Yes [provider]  fluticasone (FLONASE) 50 MCG/ACT nasal spray  Place 2 sprays into both nostrils daily.  04/11/13  Yes [provider]  meclizine (ANTIVERT) 25 MG tablet Take 1 tablet (25 mg total) by mouth 3 (three) times daily as needed for dizziness. Patient taking differently: Take 25 mg by mouth daily.  09/13/16  Yes Rolland Porter, MD  methylcellulose (ARTIFICIAL TEARS) 1 % ophthalmic solution Place 1 drop into both eyes 2 (two) times daily as needed (dry eyes).   Yes [provider]  Multiple Vitamin (MULTIVITAMIN) capsule Take 1 capsule by mouth daily.     Yes [provider]  potassium chloride SA (K-DUR,KLOR-CON) 20 MEQ tablet Take 1 tablet (20 mEq total) by mouth 2 (two) times daily. Patient taking differently: Take 20 mEq by mouth daily.  02/27/17  Yes Kefalas, Manon Hilding, PA-C  traMADol (ULTRAM) 50 MG tablet Take 50 mg by mouth daily as needed for moderate pain.  09/24/13  Yes [provider]  Vitamin D, Ergocalciferol, (DRISDOL) 1.25 MG (50000 UT) CAPS capsule TAKE 1 CAPSULE BY MOUTH ONCE A WEEK 08/20/19  Yes Lockamy, Randi L, NP-C    Family History Family History  Problem Relation Age of Onset  . Heart failure Mother   . Diabetes Sister   . Heart attack Father   . Colon cancer Neg Hx     Social History Social History   Tobacco Use  . Smoking status: Former Smoker    Packs/day: 1.00    Years: 25.00    Pack years: 25.00    Types: Cigarettes    Quit date: 09/14/2007    Years since quitting: 11.9  . Smokeless tobacco: Never Used  Substance Use Topics  . Alcohol use: Yes    Comment: occasional/rare (Brandy every now and then)  . Drug use: No     Allergies   Latex, Sulfa antibiotics, and Sulfonamide derivatives   Review of Systems Review of Systems  Constitutional: Negative for chills and fever.  HENT: Negative for congestion.   Eyes: Negative for visual disturbance.  Respiratory: Negative for shortness of breath.   Cardiovascular: Negative for chest pain.  Gastrointestinal: Negative for  abdominal pain and vomiting.  Genitourinary: Negative for dysuria and flank pain.  Musculoskeletal: Negative for back pain, neck pain and neck stiffness.  Skin: Negative for rash.  Neurological: Positive for weakness and light-headedness. Negative for numbness and headaches.     Physical Exam Updated Vital Signs BP (!) 112/95   Pulse 80   Temp 98.6 F (37 C) (Oral)   Resp 18   Ht 5\' 4"  (1.626 m)   Wt 68 kg  SpO2 98%   BMI 25.75 kg/m   Physical Exam Vitals signs and nursing note reviewed.  Constitutional:      Appearance: She is well-developed.  HENT:     Head: Normocephalic and atraumatic.  Eyes:     General:        Right eye: No discharge.        Left eye: No discharge.     Conjunctiva/sclera: Conjunctivae normal.  Neck:     Musculoskeletal: Normal range of motion and neck supple.     Trachea: No tracheal deviation.  Cardiovascular:     Rate and Rhythm: Normal rate and regular rhythm.  Pulmonary:     Effort: Pulmonary effort is normal.     Breath sounds: Normal breath sounds.  Abdominal:     General: There is no distension.     Palpations: Abdomen is soft.     Tenderness: There is no abdominal tenderness. There is no guarding.  Skin:    General: Skin is warm.     Findings: No rash.  Neurological:     Mental Status: She is alert and oriented to person, place, and time.     GCS: GCS eye subscore is 4. GCS verbal subscore is 5. GCS motor subscore is 6.     Cranial Nerves: Cranial nerves are intact.     Sensory: Sensation is intact.     Motor: Motor function is intact. No weakness.     Comments: Mild difficulty with gait      ED Treatments / Results  Labs (all labs ordered are listed, but only abnormal results are displayed) Labs Reviewed  CBC - Abnormal; Notable for the following components:      Result Value   MCV 100.3 (*)    All other components within normal limits  COMPREHENSIVE METABOLIC PANEL - Abnormal; Notable for the following components:    GFR calc non Af Amer 58 (*)    All other components within normal limits  URINALYSIS, ROUTINE W REFLEX MICROSCOPIC - Abnormal; Notable for the following components:   Leukocytes,Ua MODERATE (*)    All other components within normal limits  URINE CULTURE  SARS CORONAVIRUS 2 (TAT 6-24 HRS)  MAGNESIUM  TSH  TROPONIN I (HIGH SENSITIVITY)  TROPONIN I (HIGH SENSITIVITY)    EKG EKG Interpretation  Date/Time:  Thursday September 05 2019 11:15:00 EDT Ventricular Rate:  72 PR Interval:    QRS Duration: 86 QT Interval:  377 QTC Calculation: 413 R Axis:   -15 Text Interpretation:  Sinus rhythm Borderline left axis deviation Low voltage, precordial leads Nonspecific T abnormalities, anterior leads Confirmed by Elnora Morrison [phone]) on 09/05/2019 12:09:10 PM   Radiology Mr Brain Wo Contrast  Result Date: 09/05/2019 CLINICAL DATA:  Dizziness and weakness over the last 2-3 days. EXAM: MRI HEAD WITHOUT CONTRAST TECHNIQUE: Multiplanar, multiecho pulse sequences of the brain and surrounding structures were obtained without intravenous contrast. COMPARISON:  Head CT 04/23/2014 FINDINGS: Brain: Diffusion imaging shows a subcentimeter focus of restricted diffusion in the splenium of the corpus callosum just to the left of midline consistent with a small acute infarction. No other acute insult. Elsewhere, brainstem and cerebellum are normal. Cerebral hemispheres show moderate chronic small-vessel ischemic changes of the deep and subcortical white matter. No cortical or large vessel territory infarction. No mass lesion, hemorrhage, hydrocephalus or extra-axial collection. Vascular: Major vessels at the base of the brain show flow. Skull and upper cervical spine: Negative Sinuses/Orbits: Clear/normal Other: None IMPRESSION: Background pattern of moderate  chronic small-vessel ischemic changes affecting the brain. Acute subcentimeter infarction affecting the splenium of the corpus callosum just to the left of  midline. Electronically Signed   By: Nelson Chimes M.D.   On: 09/05/2019 14:19   Dg Chest Portable 1 View  Result Date: 09/05/2019 CLINICAL DATA:  Dizziness and weakness for 2-3 days. EXAM: PORTABLE CHEST 1 VIEW COMPARISON:  PA and lateral chest 03/16/2019 and 12/28/2016. FINDINGS: Lungs are clear. Heart size is normal. Atherosclerosis noted. No pneumothorax or pleural fluid. IMPRESSION: No acute disease. Electronically Signed   By: Inge Rise M.D.   On: 09/05/2019 12:25    Procedures Procedures (including critical care time)  Medications Ordered in ED Medications  atorvastatin (LIPITOR) tablet 80 mg (has no administration in time range)  aspirin chewable tablet 81 mg (has no administration in time range)  aspirin chewable tablet 324 mg (324 mg Oral Given 09/05/19 1512)     Initial Impression / Assessment and Plan / ED Course  I have reviewed the triage vital signs and the nursing notes.  Pertinent labs & imaging results that were available during my care of the patient were reviewed by me and considered in my medical decision making (see chart for details).       Patient presents with general weakness and lightheadedness.  No focal neuro deficits on exam.  Mild fatigue.  Concern clinically for anemia versus electrolyte abnormalities versus possible mild dehydration.  Blood work reviewed hemoglobin normal, potassium normal, urinalysis no significant sign of infection.  No definitive cause for why patient feels dizzy.  Plan for oral fluids and MRI to look for occult stroke.  Patient care will be signed out to follow-up MRI results. Patient's MRI results reviewed and patient did have a stroke.  This explains patient's dizziness and weakness for 2 to 3 days.  Paged neurology and hospitalist for further evaluation.  Aspirin ordered.  Discussed with hospitalist for admission for further stroke work-up.  Final Clinical Impressions(s) / ED Diagnoses   Final diagnoses:  Generalized  weakness  Acute thrombotic stroke [facility])    ED Discharge Orders    None       Elnora Morrison, MD 09/05/19 1544

## 2019-09-05 NOTE — H&P (Signed)
Patient Demographics:    Melody Mitchell, is a 78 y.o. female  MRN: [id number]   DOB - 29-May-1941  Admit Date - 09/05/2019  Outpatient Primary MD for the patient is [name], MD   Assessment & Plan:    Principal Problem:   Stroke (cerebrum) [facility]) Active Problems:   AVM (arteriovenous malformation) of colon, acquired   High cholesterol   Aortic atherosclerosis ([facility])    1)Acute CVA - -Acute subcentimeter infarction affecting the splenium of the corpus callosum just to the left of midline----- place on telemetry monitored unit, treat empirically with aspirin and  Atorvastatin , MRI of the brain noted, echocardiogram to rule out intracardiac thrombus  and to evaluate EF is pending,   carotid artery Dopplers without hemodynamically significant stenosis  -Neurology consult pending.   PT/OT eval pending, speech eval pending, We will allow some permissive hypertension in view of possible acute stroke, avoid precipitous drop in blood pressure. Use Hydralazine 10mg  or Labetalol 10 mg iv every 4 hrs as needed for systolic blood pressure over 210 mmhg ,  - TSH is 0.89 -- A1c and fasting lipid profile are pending -No significant speech or swallowing concerns at this time   2)GERD-stable continue Protonix   With History of - Reviewed by me  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       Bronchitis january 2014   Hiatal hernia    High cholesterol    History of contact dermatitis    Hx: recurrent pneumonia    Hypertension    IDA (iron deficiency anemia)    chronic iron infusions   Reflux    Vertigo       Past Surgical History:  Procedure Laterality Date   ABDOMINAL HYSTERECTOMY     APPENDECTOMY      BILATERAL BREAST BIOPSIES  BENIGN     CHOLECYSTECTOMY     COLONOSCOPY N/A 06/29/2015   SLF: The left colon is redundant 2. four colon polyps removed. No source  for change in bowel habits identified 3. Rectal bleeding due to large internal hemorrhoids    COLONOSCOPY W/ BIOPSIES  6 2010   Dr. Oneida Alar: Polypoid appearing lesion of the ascending colon, 3 mm sessile rectal polyp, small internal hemorrhoids. Pathology revealed polypoid mucosa, no adenomatous changes   Double-balloon enteroscopy, antegrade  April 2011   Dr. 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   ESOPHAGOGASTRODUODENOSCOPY  04/2009   Dr. Oneida Alar: Patent Schatzki ring dilated with advancing the scope, patchy erythema in the antrum, 2 small AVMs in the duodenal bulb, 2 additional AVMs noted in the second portion the duodenum, mild gastritis on pathology   ESOPHAGOGASTRODUODENOSCOPY N/A 06/29/2015   SLF: 1. 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 small hiatal hernia 3. Mild non-erosive gastririts- NO obvious source for dyspepsia identified.    EYE  SURGERY        Chief Complaint  Patient presents with   Weakness      HPI:    Melody Mitchell  is a 78 y.o. female with past medical history relevant for GERD, history of AV malformation in the colon with prior iron deficiency anemia presenting with generalized weakness and dizziness for about 3 days now  --No speech or swallowing difficulties  -brain mRi with -Acute subcentimeter infarction affecting the splenium of the corpus callosum just to the left of midline.  -Troponin is not elevated, TSH is 0.89, -CBC is unremarkable with a white count of 4.1, hemoglobin of 12.2 platelet count of 285 -Chemistry and LFTs unremarkable with a sodium of 139, potassium 4.5, creatinine 0.9, magnesium 2.3  -Given finding of small acute stroke on MRI brain EDP requested hospitalization for further stroke work-up    Review of systems:    In addition to  the HPI above,   A full Review of  Systems was done, all other systems reviewed are negative except as noted above in HPI , .    Social History:  Reviewed by me    Social History   Tobacco Use   Smoking status: Former Smoker    Packs/day: 1.00    Years: 25.00    Pack years: 25.00    Types: Cigarettes    Quit date: 09/14/2007    Years since quitting: 11.9   Smokeless tobacco: Never Used  Substance Use Topics   Alcohol use: Yes    Comment: occasional/rare (Brandy every now and then)      Family History :  Reviewed by me    Family History  Problem Relation Age of Onset   Heart failure Mother    Diabetes Sister    Heart attack Father    Colon cancer Neg Hx     Home Medications:   Prior to Admission medications   Medication Sig Start Date End Date Taking? Authorizing Provider  acetaminophen (TYLENOL) 500 MG tablet Take 1,000 mg by mouth every 6 (six) hours as needed.   Yes [provider]  amitriptyline (ELAVIL) 25 MG tablet Take 25 mg by mouth at bedtime as needed for sleep.    Yes [provider]  clobetasol cream (TEMOVATE) 0.05 %  04/01/19  Yes [provider]  COMBIGAN 0.2-0.5 % ophthalmic solution Place 1 drop into the left eye every 12 (twelve) hours.  10/08/18  Yes [provider]  esomeprazole (NEXIUM) 40 MG capsule Take 40 mg by mouth daily before breakfast.     Yes [provider]  fish oil-omega-3 fatty acids 1000 MG capsule Take 1 g by mouth daily.    Yes [provider]  fluticasone (FLONASE) 50 MCG/ACT nasal spray Place 2 sprays into both nostrils daily.  04/11/13  Yes [provider]  meclizine (ANTIVERT) 25 MG tablet Take 1 tablet (25 mg total) by mouth 3 (three) times daily as needed for dizziness. Patient taking differently: Take 25 mg by mouth daily.  09/13/16  Yes Rolland Porter, MD  methylcellulose (ARTIFICIAL TEARS) 1 % ophthalmic solution Place 1 drop into both eyes 2 (two) times daily  as needed (dry eyes).   Yes [provider]  Multiple Vitamin (MULTIVITAMIN) capsule Take 1 capsule by mouth daily.     Yes [provider]  potassium chloride SA (K-DUR,KLOR-CON) 20 MEQ tablet Take 1 tablet (20 mEq total) by mouth 2 (two) times daily. Patient taking differently: Take 20 mEq by mouth  daily.  02/27/17  Yes Kefalas, Manon Hilding, PA-C  traMADol (ULTRAM) 50 MG tablet Take 50 mg by mouth daily as needed for moderate pain.  09/24/13  Yes [provider]  Vitamin D, Ergocalciferol, (DRISDOL) 1.25 MG (50000 UT) CAPS capsule TAKE 1 CAPSULE BY MOUTH ONCE A WEEK 08/20/19  Yes Lockamy, Randi L, NP-C     Allergies:     Allergies  Allergen Reactions   Latex Hives, Itching and Rash   Sulfa Antibiotics Hives and Itching   Sulfonamide Derivatives Hives and Itching     Physical Exam:   Vitals  Blood pressure 131/86, pulse 77, temperature 98.6 F (37 C), temperature source Oral, resp. rate 19, height 5\' 4"  (1.626 m), weight 68 kg, SpO2 100 %.  Physical Examination: General appearance - alert, well appearing, and in no distress   Mental status - alert, oriented to person, place, and time,  Eyes - sclera anicteric Neck - supple, no JVD elevation , Chest - clear  to auscultation bilaterally, symmetrical air movement,  Heart - S1 and S2 normal, regular  Abdomen - soft, nontender, nondistended, no masses or organomegaly Extremities - no pedal edema noted, intact peripheral pulses  Skin - warm, dry NeuroPsych - Neurologial Exam: Mental Status: Patient is awake, alert, oriented to person, place, month, year, and situation. Patient is able to give a clear and coherent history. No signs of aphasia or neglect Cranial Nerves: II: Visual Fields are full. Pupils are equal, round, and reactive to light.   III,IV, VI: EOMI without ptosis or diploplia.  V: Facial sensation is symmetrical and wnl VII: Facial movement is symmetric.  VIII: hearing is intact to  voice X: Uvula elevates symmetrically XI: Shoulder shrug is symmetric. XII: tongue is midline without atrophy or fasciculations.  Motor: Tone is normal. Bulk is normal. 5/5 strength was present in all four extremities.  Sensory: Sensation is normal to pinprick and to touch Cerebellar: FNF without ataxia -Patient with generalized weakness but unable to elicit significant focal deficits at this time     Data Review:    CBC Recent Labs  Lab 09/05/19 1122  WBC 4.1  HGB 12.2  HCT 39.8  PLT 285  MCV 100.3*  MCH 30.7  MCHC 30.7  RDW 13.1   ------------------------------------------------------------------------------------------------------------------  Chemistries  Recent Labs  Lab 09/05/19 1122 09/05/19 1139  NA 139  --   K 4.5  --   CL 104  --   CO2 27  --   GLUCOSE 91  --   BUN 9  --   CREATININE 0.93  --   CALCIUM 9.7  --   MG  --  2.3  AST 22  --   ALT 19  --   ALKPHOS 81  --   BILITOT 0.4  --    ------------------------------------------------------------------------------------------------------------------ estimated creatinine clearance is 45.7 mL/min (by C-G formula based on SCr of 0.93 mg/dL). ------------------------------------------------------------------------------------------------------------------ Recent Labs    09/05/19 1122  TSH 0.894     Coagulation profile No results for input(s): INR, PROTIME in the last 168 hours. ------------------------------------------------------------------------------------------------------------------- No results for input(s): DDIMER in the last 72 hours. -------------------------------------------------------------------------------------------------------------------  Cardiac Enzymes No results for input(s): CKMB, TROPONINI, MYOGLOBIN in the last 168 hours.  Invalid input(s): CK ------------------------------------------------------------------------------------------------------------------ No  results found for: BNP   ---------------------------------------------------------------------------------------------------------------  Urinalysis    Component Value Date/Time   COLORURINE YELLOW 09/05/2019 [place] 09/05/2019 1138   LABSPEC 1.009 09/05/2019 1138   PHURINE 6.0 09/05/2019 1138  GLUCOSEU NEGATIVE 09/05/2019 1138   [employer] 09/05/2019 1138   [place] 09/05/2019 1138   [place] 09/05/2019 [place] 09/05/2019 1138   UROBILINOGEN 0.2 06/05/2015 1357   NITRITE NEGATIVE 09/05/2019 1138   LEUKOCYTESUR MODERATE (A) 09/05/2019 1138    ----------------------------------------------------------------------------------------------------------------   Imaging Results:    Mr Brain Wo Contrast  Result Date: 09/05/2019 CLINICAL DATA:  Dizziness and weakness over the last 2-3 days. EXAM: MRI HEAD WITHOUT CONTRAST TECHNIQUE: Multiplanar, multiecho pulse sequences of the brain and surrounding structures were obtained without intravenous contrast. COMPARISON:  Head CT 04/23/2014 FINDINGS: Brain: Diffusion imaging shows a subcentimeter focus of restricted diffusion in the splenium of the corpus callosum just to the left of midline consistent with a small acute infarction. No other acute insult. Elsewhere, brainstem and cerebellum are normal. Cerebral hemispheres show moderate chronic small-vessel ischemic changes of the deep and subcortical white matter. No cortical or large vessel territory infarction. No mass lesion, hemorrhage, hydrocephalus or extra-axial collection. Vascular: Major vessels at the base of the brain show flow. Skull and upper cervical spine: Negative Sinuses/Orbits: Clear/normal Other: None IMPRESSION: Background pattern of moderate chronic small-vessel ischemic changes affecting the brain. 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 Electronically  Signed   By: Nelson Chimes M.D.   On: 09/05/2019 14:19   US Carotid Bilateral  Result Date: 09/05/2019 CLINICAL DATA:  77 year old female with syncope, collapse and ataxia EXAM: BILATERAL CAROTID DUPLEX ULTRASOUND TECHNIQUE: Pearline Cables scale imaging, color Doppler and duplex ultrasound were performed of bilateral carotid and vertebral arteries in the neck. COMPARISON:  Prior carotid duplex ultrasound 04/26/2010; CTA of the neck 07/28/2014 FINDINGS: Criteria: Quantification of carotid stenosis is based on velocity parameters that correlate the residual internal carotid diameter with NASCET-based stenosis levels, using the diameter of the distal internal carotid lumen as the denominator for stenosis measurement. The following velocity measurements were obtained: RIGHT ICA: 76/30 cm/sec CCA: 99991111 cm/sec SYSTOLIC ICA/CCA RATIO:  1.2 ECA:  50 cm/sec LEFT ICA: 54/23 cm/sec CCA: 123456 cm/sec SYSTOLIC ICA/CCA RATIO:  0.6 ECA:  74 cm/sec RIGHT CAROTID ARTERY: Heterogeneous but smooth atherosclerotic plaque in the proximal internal carotid artery. There is positive remodeling and as such, by peak systolic velocity criteria, the estimated stenosis remains less than 50%. RIGHT VERTEBRAL ARTERY:  Patent with normal antegrade flow. LEFT CAROTID ARTERY: Heterogeneous and partially calcified atherosclerotic plaque in the proximal internal carotid artery. By peak systolic velocity criteria, the estimated stenosis remains less than 50%. LEFT VERTEBRAL ARTERY:  Patent with normal antegrade flow. IMPRESSION: 1. 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 2. Mild (1-49%) stenosis proximal left internal carotid artery secondary to heterogeneous, irregular and partially calcified atherosclerotic plaque. 3. Vertebral arteries are patent with normal antegrade flow. Signed, Criselda Peaches, MD, [facility]  Radiology Electronically Signed   By: Jacqulynn Cadet M.D.   On: 09/05/2019 17:01   Dg Chest Portable 1 View  Result Date: 09/05/2019 CLINICAL DATA:  Dizziness and weakness for 2-3 days. EXAM: PORTABLE CHEST 1 VIEW COMPARISON:  PA and lateral chest 03/16/2019 and 12/28/2016. FINDINGS: Lungs are clear. Heart size is normal. Atherosclerosis noted. No pneumothorax or pleural fluid. IMPRESSION: No acute disease. Electronically Signed   By: Inge Rise M.D.   On: 09/05/2019 12:25    Radiological Exams on Admission: Mr Brain Wo Contrast  Result Date: 09/05/2019 CLINICAL  DATA:  Dizziness and weakness over the last 2-3 days. EXAM: MRI HEAD WITHOUT CONTRAST TECHNIQUE: Multiplanar, multiecho pulse sequences of the brain and surrounding structures were obtained without intravenous contrast. COMPARISON:  Head CT 04/23/2014 FINDINGS: Brain: Diffusion imaging shows a subcentimeter focus of restricted diffusion in the splenium of the corpus callosum just to the left of midline consistent with a small acute infarction. No other acute insult. Elsewhere, brainstem and cerebellum are normal. Cerebral hemispheres show moderate chronic small-vessel ischemic changes of the deep and subcortical white matter. No cortical or large vessel territory infarction. No mass lesion, hemorrhage, hydrocephalus or extra-axial collection. Vascular: Major vessels at the base of the brain show flow. Skull and upper cervical spine: Negative Sinuses/Orbits: Clear/normal Other: None IMPRESSION: Background pattern of moderate chronic small-vessel ischemic changes affecting the brain. 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 Electronically Signed   By: Nelson Chimes M.D.   On: 09/05/2019 14:19   US Carotid Bilateral  Result Date: 09/05/2019 CLINICAL DATA:  78 year old female with syncope, collapse and ataxia EXAM: BILATERAL CAROTID DUPLEX ULTRASOUND TECHNIQUE: [place] Cables scale  imaging, color Doppler and duplex ultrasound were performed of bilateral carotid and vertebral arteries in the neck. COMPARISON:  Prior carotid duplex ultrasound 04/26/2010; CTA of the neck 07/28/2014 FINDINGS: Criteria: Quantification of carotid stenosis is based on velocity parameters that correlate the residual internal carotid diameter with NASCET-based stenosis levels, using the diameter of the distal internal carotid lumen as the denominator for stenosis measurement. The following velocity measurements were obtained: RIGHT ICA: 76/30 cm/sec CCA: 99991111 cm/sec SYSTOLIC ICA/CCA RATIO:  1.2 ECA:  50 cm/sec LEFT ICA: 54/23 cm/sec CCA: 123456 cm/sec SYSTOLIC ICA/CCA RATIO:  0.6 ECA:  74 cm/sec RIGHT CAROTID ARTERY: Heterogeneous but smooth atherosclerotic plaque in the proximal internal carotid artery. There is positive remodeling and as such, by peak systolic velocity criteria, the estimated stenosis remains less than 50%. RIGHT VERTEBRAL ARTERY:  Patent with normal antegrade flow. LEFT CAROTID ARTERY: Heterogeneous and partially calcified atherosclerotic plaque in the proximal internal carotid artery. By peak systolic velocity criteria, the estimated stenosis remains less than 50%. LEFT VERTEBRAL ARTERY:  Patent with normal antegrade flow. IMPRESSION: 1. 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 2. Mild (1-49%) stenosis proximal left internal carotid artery secondary to heterogeneous, irregular and partially calcified atherosclerotic plaque. 3. Vertebral arteries are patent with normal antegrade flow. Signed, Criselda Peaches, MD, [facility] Radiology Electronically Signed   By: Jacqulynn Cadet M.D.   On: 09/05/2019 17:01   Dg Chest Portable 1 View  Result Date: 09/05/2019 CLINICAL DATA:  Dizziness and weakness for 2-3 days. EXAM: PORTABLE CHEST 1 VIEW COMPARISON:  PA and  lateral chest 03/16/2019 and 12/28/2016. FINDINGS: Lungs are clear. Heart size is normal. Atherosclerosis noted. No pneumothorax or pleural fluid. IMPRESSION: No acute disease. Electronically Signed   By: Inge Rise M.D.   On: 09/05/2019 12:25    DVT Prophylaxis -SCD  /heparin AM Labs Ordered, also please review Full Orders  Family Communication: Admission, patients condition and plan of care including tests being ordered have been discussed with the patient  who indicate understanding and agree with the plan   Code Status - Full Code  Likely DC to  TBD  Condition   stable  Roxan Hockey M.D on 09/05/2019 at 6:57 PM Go to [email or web address] -  for contact info  [facility] -  Office  [phone]

## 2019-09-05 NOTE — ED Notes (Signed)
ED TO INPATIENT HANDOFF REPORT  ED Nurse Name and Phone #: [phone]  S Name/Age/Gender Melody Mitchell 78 y.o. female Room/Bed: APA02/APA02  Code Status   Code Status: Full Code  Home/SNF/Other Home Patient oriented to: self, place, time and situation Is this baseline? Yes   Triage Complete: Triage complete  Chief Complaint Weakness; Dizziness  Triage Note Pt has been feeling dizzy and weak for the last 2-3 days. VSS. History of anemia. Negative on stroke screen.    Allergies Allergies  Allergen Reactions  . Latex Hives, Itching and Rash  . Sulfa Antibiotics Hives and Itching  . Sulfonamide Derivatives Hives and Itching    Level of Care/Admitting Diagnosis ED Disposition    ED Disposition Condition [facility] Area: [facility] [id number]  Level of Care: Telemetry [5]  Covid Evaluation: N/A  Diagnosis: Stroke (cerebrum) [facility]) [id number]  Admitting Physician: Morrison Old  Attending Physician: Morrison Old  Estimated length of stay: 3 - 4 days  Certification:: I certify this patient will need inpatient services for at least 2 midnights  Bed request comments: tele  PT Class (Do Not Modify): Inpatient [101]  PT Acc Code (Do Not Modify): Private [1]       B Medical/Surgery History Past Medical History:  Diagnosis Date  . Acid reflux   . Arthritis   . 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 Bronchitis january 2014  . Hiatal hernia   . High cholesterol   . History of contact dermatitis   . Hx: recurrent pneumonia   . Hypertension   . IDA (iron deficiency anemia)    chronic iron infusions  . Reflux   . Vertigo    Past Surgical History:  Procedure Laterality Date  . ABDOMINAL HYSTERECTOMY    . APPENDECTOMY    . BILATERAL BREAST BIOPSIES  BENIGN    . CHOLECYSTECTOMY    . COLONOSCOPY N/A 06/29/2015   SLF: The left colon is redundant 2. four colon polyps removed. No source  for  change in bowel habits identified 3. Rectal bleeding due to large internal hemorrhoids   . COLONOSCOPY W/ BIOPSIES  6 2010   Dr. Oneida Alar: Polypoid appearing lesion of the ascending colon, 3 mm sessile rectal polyp, small internal hemorrhoids. Pathology revealed polypoid mucosa, no adenomatous changes  . Double-balloon enteroscopy, antegrade  April 2011   Dr. 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  Marland Kitchen ESOPHAGOGASTRODUODENOSCOPY  04/2009   Dr. Oneida Alar: Patent Schatzki ring dilated with advancing the scope, patchy erythema in the antrum, 2 small AVMs in the duodenal bulb, 2 additional AVMs noted in the second portion the duodenum, mild gastritis on pathology  . ESOPHAGOGASTRODUODENOSCOPY N/A 06/29/2015   SLF: 1. 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 small hiatal hernia 3. Mild non-erosive gastririts- NO obvious source for dyspepsia identified.   Marland Kitchen EYE SURGERY       A IV Location/Drains/Wounds Patient Lines/Drains/Airways Status   Active Line/Drains/Airways    Name:   Placement date:   Placement time:   Site:   Days:   Peripheral IV 09/05/19 Left Antecubital   09/05/19    1122    Antecubital   less than 1          Intake/Output Last 24 hours No intake or output data in the 24 hours ending 09/05/19 1902  Labs/Imaging Results for orders placed or performed during the hospital encounter of 09/05/19 (from  the past 48 hour(s))  CBC     Status: Abnormal   Collection Time: 09/05/19 11:22 AM  Result Value Ref Range   WBC 4.1 4.0 - 10.5 K/uL   RBC 3.97 3.87 - 5.11 MIL/uL   Hemoglobin 12.2 12.0 - 15.0 g/dL   HCT 39.8 36.0 - 46.0 %   MCV 100.3 (H) 80.0 - 100.0 fL   MCH 30.7 26.0 - 34.0 pg   MCHC 30.7 30.0 - 36.0 g/dL   RDW 13.1 11.5 - 15.5 %   Platelets 285 150 - 400 K/uL   nRBC 0.0 0.0 - 0.2 %    Comment: Performed at [facility], [address], [place], [place] [address]  Comprehensive metabolic panel     Status: Abnormal   Collection Time: 09/05/19  11:22 AM  Result Value Ref Range   Sodium 139 135 - 145 mmol/L   Potassium 4.5 3.5 - 5.1 mmol/L   Chloride 104 98 - 111 mmol/L   CO2 27 22 - 32 mmol/L   Glucose, Bld 91 70 - 99 mg/dL   BUN 9 8 - 23 mg/dL   Creatinine, Ser 0.93 0.44 - 1.00 mg/dL   Calcium 9.7 8.9 - 10.3 mg/dL   Total Protein 7.6 6.5 - 8.1 g/dL   Albumin 4.2 3.5 - 5.0 g/dL   AST 22 15 - 41 U/L   ALT 19 0 - 44 U/L   Alkaline Phosphatase 81 38 - 126 U/L   Total Bilirubin 0.4 0.3 - 1.2 mg/dL   GFR calc non Af Amer 58 (L) >60 mL/min   GFR calc Af Amer >60 >60 mL/min   Anion gap 8 5 - 15    Comment: Performed at [facility], [address], [place], [place] [address]  Troponin I (High Sensitivity)     Status: None   Collection Time: 09/05/19 11:22 AM  Result Value Ref Range   Troponin I (High Sensitivity) 3 <18 ng/L    Comment: (NOTE) Elevated high sensitivity troponin I (hsTnI) values and significant  changes across serial measurements may suggest ACS but many other  chronic and acute conditions are known to elevate hsTnI results.  Refer to the "Links" section for chest pain algorithms and additional  guidance. Performed at [facility], [address], [place], [place] [address]   TSH     Status: None   Collection Time: 09/05/19 11:22 AM  Result Value Ref Range   TSH 0.894 0.350 - 4.500 uIU/mL    Comment: Performed by a 3rd Generation assay with a functional sensitivity of <=0.01 uIU/mL. Performed at [facility], [address] [place], [place] [address]   Urinalysis, Routine w reflex microscopic     Status: Abnormal   Collection Time: 09/05/19 11:38 AM  Result Value Ref Range   Color, Urine YELLOW YELLOW   APPearance CLEAR CLEAR   Specific Gravity, Urine 1.009 1.005 - 1.030   pH 6.0 5.0 - 8.0   Glucose, UA NEGATIVE NEGATIVE mg/dL   Hgb urine dipstick NEGATIVE NEGATIVE   Bilirubin Urine NEGATIVE NEGATIVE   Ketones, ur NEGATIVE NEGATIVE mg/dL   Protein, ur NEGATIVE NEGATIVE mg/dL   Nitrite NEGATIVE  NEGATIVE   Leukocytes,Ua MODERATE (A) NEGATIVE   RBC / HPF 0-5 0 - 5 RBC/hpf   WBC, UA 0-5 0 - 5 WBC/hpf   Bacteria, UA NONE SEEN NONE SEEN   Squamous Epithelial / LPF 6-10 0 - 5   Mucus PRESENT    Hyaline Casts, UA PRESENT  Comment: Performed at [facility], [address] [place], [place] [address]  Magnesium     Status: None   Collection Time: 09/05/19 11:39 AM  Result Value Ref Range   Magnesium 2.3 1.7 - 2.4 mg/dL    Comment: Performed at [facility], [address] [place], [place] [address]  Troponin I (High Sensitivity)     Status: None   Collection Time: 09/05/19  1:22 PM  Result Value Ref Range   Troponin I (High Sensitivity) 3 <18 ng/L    Comment: (NOTE) Elevated high sensitivity troponin I (hsTnI) values and significant  changes across serial measurements may suggest ACS but many other  chronic and acute conditions are known to elevate hsTnI results.  Refer to the "Links" section for chest pain algorithms and additional  guidance. Performed at [facility], [address], [place], [place] [address]    Mr Brain 59 Contrast  Result Date: 09/05/2019 CLINICAL DATA:  Dizziness and weakness over the last 2-3 days. EXAM: MRI HEAD WITHOUT CONTRAST TECHNIQUE: Multiplanar, multiecho pulse sequences of the brain and surrounding structures were obtained without intravenous contrast. COMPARISON:  Head CT 04/23/2014 FINDINGS: Brain: Diffusion imaging shows a subcentimeter focus of restricted diffusion in the splenium of the corpus callosum just to the left of midline consistent with a small acute infarction. No other acute insult. Elsewhere, brainstem and cerebellum are normal. Cerebral hemispheres show moderate chronic small-vessel ischemic changes of the deep and subcortical white matter. No cortical or large vessel territory infarction. No mass lesion, hemorrhage, hydrocephalus or extra-axial collection. Vascular: Major vessels at the base of the brain show flow. Skull and upper  cervical spine: Negative Sinuses/Orbits: Clear/normal Other: None IMPRESSION: Background pattern of moderate chronic small-vessel ischemic changes affecting the brain. 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 Electronically Signed   By: Nelson Chimes M.D.   On: 09/05/2019 14:19   US Carotid Bilateral  Result Date: 09/05/2019 CLINICAL DATA:  78 year old female with syncope, collapse and ataxia EXAM: BILATERAL CAROTID DUPLEX ULTRASOUND TECHNIQUE: Pearline Cables scale imaging, color Doppler and duplex ultrasound were performed of bilateral carotid and vertebral arteries in the neck. COMPARISON:  Prior carotid duplex ultrasound 04/26/2010; CTA of the neck 07/28/2014 FINDINGS: Criteria: Quantification of carotid stenosis is based on velocity parameters that correlate the residual internal carotid diameter with NASCET-based stenosis levels, using the diameter of the distal internal carotid lumen as the denominator for stenosis measurement. The following velocity measurements were obtained: RIGHT ICA: 76/30 cm/sec CCA: [id number] cm/sec SYSTOLIC ICA/CCA RATIO:  1.2 ECA:  50 cm/sec LEFT ICA: 54/23 cm/sec CCA: [id number] cm/sec SYSTOLIC ICA/CCA RATIO:  0.6 ECA:  74 cm/sec RIGHT CAROTID ARTERY: Heterogeneous but smooth atherosclerotic plaque in the proximal internal carotid artery. There is positive remodeling and as such, by peak systolic velocity criteria, the estimated stenosis remains less than 50%. RIGHT VERTEBRAL ARTERY:  Patent with normal antegrade flow. LEFT CAROTID ARTERY: Heterogeneous and partially calcified atherosclerotic plaque in the proximal internal carotid artery. By peak systolic velocity criteria, the estimated stenosis remains less than 50%. LEFT VERTEBRAL ARTERY:  Patent with normal antegrade flow. IMPRESSION: 1. 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 2. Mild (1-49%) stenosis  proximal left internal carotid artery secondary to heterogeneous, irregular and partially calcified atherosclerotic plaque. 3. Vertebral arteries are patent with normal antegrade flow. Signed, Criselda Peaches, MD, [facility] Radiology Electronically Signed  By: Jacqulynn Cadet M.D.   On: 09/05/2019 17:01   Dg Chest Portable 1 View  Result Date: 09/05/2019 CLINICAL DATA:  Dizziness and weakness for 2-3 days. EXAM: PORTABLE CHEST 1 VIEW COMPARISON:  PA and lateral chest 03/16/2019 and 12/28/2016. FINDINGS: Lungs are clear. Heart size is normal. Atherosclerosis noted. No pneumothorax or pleural fluid. IMPRESSION: No acute disease. Electronically Signed   By: Inge Rise M.D.   On: 09/05/2019 12:25    Pending Labs Unresulted Labs (From admission, onward)    Start     Ordered   09/06/19 0500  Lipid panel  Tomorrow morning,   R     09/05/19 1519   09/06/19 [id number]  Basic metabolic panel  Tomorrow morning,   R     09/05/19 1858   09/05/19 1448  SARS CORONAVIRUS 2 (TAT 6-24 HRS) Nasopharyngeal Nasopharyngeal Swab  (Asymptomatic/Tier 2 Patients Labs)  Once,   STAT    Question Answer Comment  Is this test for diagnosis or screening Screening   Symptomatic for COVID-19 as defined by CDC No   Hospitalized for COVID-19 No   Admitted to ICU for COVID-19 No   Previously tested for COVID-19 No   Resident in a congregate (group) care setting No   Employed in healthcare setting No   Pregnant No      09/05/19 1447   09/05/19 1432  Urine culture  ONCE - STAT,   STAT     09/05/19 1431          Vitals/Pain Today's Vitals   09/05/19 1600 09/05/19 1700 09/05/19 1730 09/05/19 1830  BP: (!) 122/95 (!) 115/94 (!) 119/93 131/86  Pulse: 80 80 80 77  Resp: 16 18 19    Temp:      TempSrc:      SpO2: 97% 100% 99% 100%  Weight:      Height:      PainSc:        Isolation Precautions No active isolations  Medications Medications   atorvastatin (LIPITOR) tablet 80 mg (has no administration in time range)  aspirin chewable tablet 81 mg (has no administration in time range)  pantoprazole (PROTONIX) EC tablet 40 mg (has no administration in time range)  multivitamin capsule 1 capsule (has no administration in time range)  amitriptyline (ELAVIL) tablet 25 mg (has no administration in time range)  fish oil-omega-3 fatty acids capsule 1 g (has no administration in time range)  fluticasone (FLONASE) 50 MCG/ACT nasal spray 2 spray (has no administration in time range)  methylcellulose (ARTIFICIAL TEARS) 1 % ophthalmic solution 1 drop (has no administration in time range)  potassium chloride SA (KLOR-CON) CR tablet 20 mEq (has no administration in time range)  brimonidine-timolol (COMBIGAN) 0.2-0.5 % ophthalmic solution 1 drop (has no administration in time range)  Vitamin D (Ergocalciferol) (DRISDOL) capsule 50,000 Units (has no administration in time range)  sodium chloride flush (NS) 0.9 % injection 3 mL (has no administration in time range)  sodium chloride flush (NS) 0.9 % injection 3 mL (has no administration in time range)  0.9 %  sodium chloride infusion (has no administration in time range)  acetaminophen (TYLENOL) tablet 650 mg (has no administration in time range)    Or  acetaminophen (TYLENOL) suppository 650 mg (has no administration in time range)  traZODone (DESYREL) tablet 50 mg (has no administration in time range)  polyethylene glycol (MIRALAX / GLYCOLAX) packet 17 g (has no administration in time range)  ondansetron (ZOFRAN) tablet 4 mg (has  no administration in time range)    Or  ondansetron (ZOFRAN) injection 4 mg (has no administration in time range)  albuterol (PROVENTIL) (2.5 MG/3ML) 0.083% nebulizer solution 2.5 mg (has no administration in time range)  heparin injection 5,000 Units (has no administration in time range)  aspirin chewable tablet 324 mg (324 mg Oral Given 09/05/19 1512)     Mobility walks Low fall risk   Focused Assessments    R Recommendations: See Admitting Provider Note  Report given to:   Additional Notes:

## 2019-09-05 NOTE — ED Notes (Signed)
Patient transported to MRI 

## 2019-09-05 NOTE — ED Triage Notes (Signed)
Pt has been feeling dizzy and weak for the last 2-3 days. VSS. History of anemia. Negative on stroke screen.

## 2019-09-06 ENCOUNTER — Inpatient Hospital Stay (HOSPITAL_COMMUNITY): Payer: Medicare Other

## 2019-09-06 DIAGNOSIS — I6389 Other cerebral infarction: Secondary | ICD-10-CM | POA: Diagnosis not present

## 2019-09-06 DIAGNOSIS — N3000 Acute cystitis without hematuria: Secondary | ICD-10-CM

## 2019-09-06 DIAGNOSIS — N390 Urinary tract infection, site not specified: Secondary | ICD-10-CM | POA: Diagnosis present

## 2019-09-06 DIAGNOSIS — E7800 Pure hypercholesterolemia, unspecified: Secondary | ICD-10-CM | POA: Diagnosis not present

## 2019-09-06 LAB — LIPID PANEL
Cholesterol: 209 mg/dL — ABNORMAL HIGH (ref 0–200)
HDL: 53 mg/dL (ref >40)
LDL Cholesterol: 114 mg/dL — ABNORMAL HIGH (ref 0–99)
Total CHOL/HDL Ratio: 3.9 RATIO
Triglycerides: 208 mg/dL — ABNORMAL HIGH (ref <150)
VLDL: 42 mg/dL — ABNORMAL HIGH (ref 0–40)

## 2019-09-06 LAB — BASIC METABOLIC PANEL
Anion gap: 10 (ref 5–15)
BUN: 13 mg/dL (ref 8–23)
CO2: 25 mmol/L (ref 22–32)
Calcium: 9.5 mg/dL (ref 8.9–10.3)
Chloride: 104 mmol/L (ref 98–111)
Creatinine, Ser: 0.85 mg/dL (ref 0.44–1.00)
GFR calc Af Amer: >60 mL/min (ref >60)
GFR calc non Af Amer: >60 mL/min (ref >60)
Glucose, Bld: 108 mg/dL — ABNORMAL HIGH (ref 70–99)
Potassium: 4.1 mmol/L (ref 3.5–5.1)
Sodium: 139 mmol/L (ref 135–145)

## 2019-09-06 LAB — SARS CORONAVIRUS 2 (TAT 6-24 HRS): SARS Coronavirus 2: NEGATIVE

## 2019-09-06 LAB — ECHOCARDIOGRAM COMPLETE
Height: 64 in
Weight: 2250.46 oz

## 2019-09-06 MED ORDER — ATORVASTATIN CALCIUM 40 MG PO TABS: 40.0000 mg | ORAL_TABLET | Freq: Every evening | ORAL | 4 refills | Status: DC

## 2019-09-06 MED ORDER — POTASSIUM CHLORIDE CRYS ER 20 MEQ PO TBCR: 20.0000 meq | EXTENDED_RELEASE_TABLET | Freq: Every day | ORAL | 0 refills | Status: DC

## 2019-09-06 MED ORDER — BRIMONIDINE TARTRATE 0.2 % OP SOLN
1.0000 [drp] | Freq: Two times a day (BID) | OPHTHALMIC | Status: DC
  Administered 2019-09-06: 1 [drp] via OPHTHALMIC
  Filled 2019-09-06 (×2): qty 5

## 2019-09-06 MED ORDER — TIMOLOL MALEATE 0.5 % OP SOLN
1.0000 [drp] | Freq: Two times a day (BID) | OPHTHALMIC | Status: DC
  Administered 2019-09-06: 1 [drp] via OPHTHALMIC
  Filled 2019-09-06: qty 5

## 2019-09-06 MED ORDER — CEPHALEXIN 500 MG PO CAPS: 500.0000 mg | ORAL_CAPSULE | Freq: Three times a day (TID) | ORAL | 0 refills | Status: AC

## 2019-09-06 MED ORDER — SODIUM CHLORIDE 0.9 % IV SOLN
1.0000 g | Freq: Once | INTRAVENOUS | Status: AC
  Administered 2019-09-06: 1 g via INTRAVENOUS
  Filled 2019-09-06: qty 10

## 2019-09-06 MED ORDER — POLYETHYLENE GLYCOL 3350 17 G PO PACK: 17.0000 g | PACK | Freq: Every day | ORAL | 3 refills | Status: DC | PRN

## 2019-09-06 MED ORDER — ASPIRIN EC 325 MG PO TBEC: 325.0000 mg | DELAYED_RELEASE_TABLET | Freq: Every day | ORAL | 0 refills | Status: DC

## 2019-09-06 MED ORDER — BISACODYL 10 MG RE SUPP
10.0000 mg | Freq: Once | RECTAL | Status: AC
  Administered 2019-09-06: 10 mg via RECTAL
  Filled 2019-09-06: qty 1

## 2019-09-06 NOTE — Evaluation (Signed)
Physical Therapy Evaluation Patient Details Name: Melody Mitchell MRN: [id number] DOB: February 14, 1941 Today's Date: 09/06/2019   History of Present Illness  Acute CVA - -Acute subcentimeter infarction affecting the splenium of the corpus callosum just to the left of midline----- place on telemetry monitored unit, treat empirically with aspirin and  Atorvastatin , MRI of the brain noted, echocardiogram to rule out intracardiac thrombus  and to evaluate EF is pending,   carotid artery Dopplers without hemodynamically significant stenosis     Clinical Impression  Patient limited for functional mobility as stated below secondary to BLE weakness, fatigue and fair standing balance. Patient able to perform bed mobility and transfers without assist but requires min guard for safety today while ambulating. She is able to use cane for ambulation but remains most limited by weakness and impaired activity tolerance. Patient will benefit from continued physical therapy in hospital and recommended venue below to increase strength, balance, endurance for safe ADLs and gait.     Follow Up Recommendations Home health PT;Supervision - Intermittent    Equipment Recommendations  None recommended by PT    Recommendations for Other Services       Precautions / Restrictions Precautions Precautions: Fall Restrictions Weight Bearing Restrictions: No      Mobility  Bed Mobility Overal bed mobility: Modified Independent             General bed mobility comments: minimally slower  Transfers Overall transfer level: Modified independent Equipment used: Quad cane             General transfer comment: able to transfer from sit to stand from bedside with quad cane, patient became slightly lightheaded upon standing which reduced quickly  Ambulation/Gait Ambulation/Gait assistance: Modified independent (Device/Increase time);Min guard Gait Distance (Feet): 100 Feet Assistive device: Quad cane Gait  Pattern/deviations: Step-through pattern;Decreased step length - right;Decreased step length - left;Decreased stride length Gait velocity: decreased   General Gait Details: slow, labored, patient became minimally SOB towared the end of ambulation, patient required 3 rest breaks during, required UE support at wall during breaks  Stairs            Wheelchair Mobility    Modified Rankin (Stroke Patients Only)       Balance Overall balance assessment: Needs assistance Sitting-balance support: Feet supported;No upper extremity supported Sitting balance-Leahy Scale: Good Sitting balance - Comments: seated at bedside   Standing balance support: Single extremity supported Standing balance-Leahy Scale: Fair Standing balance comment: using quad cane                             Pertinent Vitals/Pain Pain Assessment: No/denies pain    Home Living Family/patient expects to be discharged to:: Private residence Living Arrangements: Alone Available Help at Discharge: Family;Available PRN/intermittently Type of Home: Apartment Home Access: Level entry     Home Layout: One level Home Equipment: Cane - single point;Shower seat;Grab bars - toilet;Grab bars - tub/shower      Prior Function Level of Independence: Independent with assistive device(s)         Comments: Pt uses SPC as needed. Independent in ADLs, does not drive, patient describes herself as household and limited community ambulator     Hand Dominance   Dominant Hand: Right    Extremity/Trunk Assessment   Upper Extremity Assessment Upper Extremity Assessment: Defer to OT evaluation    Lower Extremity Assessment Lower Extremity Assessment: Generalized weakness    Cervical / Trunk  Assessment Cervical / Trunk Assessment: Normal  Communication   Communication: No difficulties  Cognition Arousal/Alertness: Awake/alert Behavior During Therapy: WFL for tasks assessed/performed Overall Cognitive  Status: Within Functional Limits for tasks assessed                                        General Comments      Exercises     Assessment/Plan    PT Assessment Patient needs continued PT services  PT Problem List Decreased strength;Decreased activity tolerance;Decreased balance;Decreased mobility       PT Treatment Interventions DME instruction;Therapeutic exercise;Gait training;Balance training;Neuromuscular re-education;Functional mobility training;Patient/family education;Therapeutic activities    PT Goals (Current goals can be found in the Care Plan section)  Acute Rehab PT Goals Patient Stated Goal: Return home PT Goal Formulation: With patient Time For Goal Achievement: 09/20/19 Potential to Achieve Goals: Good    Frequency Min 3X/week   Barriers to discharge        Co-evaluation               AM-PAC PT "6 Clicks" Mobility  Outcome Measure Help needed turning from your back to your side while in a flat bed without using bedrails?: None Help needed moving from lying on your back to sitting on the side of a flat bed without using bedrails?: None Help needed moving to and from a bed to a chair (including a wheelchair)?: A Little Help needed standing up from a chair using your arms (e.g., wheelchair or bedside chair)?: None Help needed to walk in hospital room?: A Little Help needed climbing 3-5 steps with a railing? : A Lot 6 Click Score: 20    End of Session Equipment Utilized During Treatment: Gait belt Activity Tolerance: Patient tolerated treatment well;Patient limited by fatigue Patient left: in chair;with call bell/phone within reach Nurse Communication: Mobility status PT Visit Diagnosis: Unsteadiness on feet (R26.81);Other abnormalities of gait and mobility (R26.89);Muscle weakness (generalized) (M62.81)    Time: [id number] PT Time Calculation (min) (ACUTE ONLY): 22 min   Charges:   PT Evaluation $PT Eval Moderate Complexity: 1  Mod PT Treatments $Therapeutic Activity: 8-22 mins        9:29 AM, 09/06/19 Mearl Latin PT, DPT Physical Therapist at [facility] [place]

## 2019-09-06 NOTE — TOC Progression Note (Signed)
[place] [phone]  [place] my Favorites Quality of Patient Care Rating 4 out of 5 stars Patient Survey Summary Rating 4 out of [place] [phone]  [place] my Favorites Quality of Patient Care Rating 3 out of 5 stars Patient Survey Summary Rating 5 out of [place] [phone]  [place] my Favorites Quality of Patient Care Rating 3 out of 5 stars Patient Survey Summary Rating 4 out of [place] [phone]  [place] my Favorites Quality of Patient Care Rating 3  out of 5 stars Patient Survey Summary Rating 4 out of 5 stars [place] [phone]) [phone]  Add [facility] my Favorites Quality of Patient Care Rating 4  out of 5 stars Patient Survey Summary Rating 3 out of 5 stars [place] [phone]  [place] my Favorites Quality of Patient Care Rating 4 out of 5 stars Patient Survey Summary Rating 4 out of 5 stars [place] [phone]) [phone]  Add [facility] my Favorites Quality of Patient Care Rating 4 out of 5 stars Patient Survey Summary Rating 4 out of 5 stars [place] [phone]  [facility] my Favorites Quality of Patient Care Rating 4 out of 5 stars Patient Survey Summary Rating 4 out of 5 stars [place] AGE [phone]  [place] my Favorites Quality of Patient Care Rating 3 out of 5 stars Patient Survey Summary Rating 3 out of 5 stars [facility] [place] [phone]  Add [facility] [place] my Favorites Quality of Patient Care Rating 3  out of 5 stars Patient Survey Summary Rating 4 out of 5 stars [place] [phone]  [place] my Favorites Quality of Patient Care Rating 3 out of 5 stars Patient Survey Summary Rating 4 out of 5 stars [facility] [phone]  Add [facility] my Favorites Quality of Patient Care Rating 3  out of 5 stars Patient Survey Summary Rating 3 out of 5 stars [place] [phone]  Add [facility] my Favorites Quality of Patient Care Rating 3  out of 5 stars Not [place] [phone]  [place] my Favorites Quality of Patient Care Rating 4  out of 5 stars Patient Survey Summary Rating 3 out of 5 stars Viewing 1 - 14 of 14 results

## 2019-09-06 NOTE — Plan of Care (Signed)
  Problem: Acute Rehab PT Goals(only PT should resolve) Goal: Pt Will Transfer Bed To Chair/Chair To Bed Outcome: Progressing Flowsheets (Taken 09/06/2019 0930) Pt will Transfer Bed to Chair/Chair to Bed: with modified independence Goal: Pt Will Perform Standing Balance Or Pre-Gait Outcome: Progressing Flowsheets (Taken 09/06/2019 0930) Pt will perform standing balance or pre-gait: with Modified Independent Goal: Pt Will Ambulate 09/06/2019 0931 by Mearl Latin, PT Outcome: Progressing Flowsheets (Taken 09/06/2019 0931) Pt will Ambulate:  > 125 feet  with modified independence  with supervision  with cane 9:32 AM, 09/06/19 Mearl Latin PT, DPT Physical Therapist at [facility]

## 2019-09-06 NOTE — TOC Transition Note (Addendum)
Transition of Care [facility]) - CM/SW Discharge Note   Patient Details  Name: Melody Mitchell MRN: [id number] Date of Birth: 11-18-1940  Transition of Care [facility]) CM/SW Contact:  Boneta Lucks, RN Phone Number: 09/06/2019, 2:20 PM   Clinical Narrative:   Patient discharging home today. PT recommending HHPT. Patient agrees and has used [facility] in the past.  Left a message with Melissa at [facility], waiting a return call to give her the referral.  [place]:  [facility] does not have staffing to accept this referral.  [place] they took the referral and will set up a time to see the patient on Tuesday or Wednesday. Add to AVS and Updated RN. Faxed Orders to [phone] to Amy  Expected Discharge Plan: [place] Barriers to Discharge: Barriers Resolved   Patient Goals and CMS Choice Patient states their goals for this hospitalization and ongoing recovery are:: to go home. CMS Medicare.gov Compare Post Acute Care list provided to:: Patient    Expected Discharge Plan and Services Expected Discharge Plan: [place]         Expected Discharge Date: 09/06/19                         HH Arranged: PT HH Agency: [place] ([place]) Date [place]: 09/06/19 Time [place]: 1340 Representative spoke with at [place]: Left message with Melissa  Prior Living Arrangements/Services       Do you feel safe going back to the place where you live?: Yes               Activities of Daily Living Home Assistive Devices/Equipment: Cane (specify quad or straight) ADL Screening (condition at time of admission) Patient's cognitive ability adequate to safely complete daily activities?: Yes Is the patient deaf or have difficulty hearing?: No Does the patient have difficulty seeing, even when wearing glasses/contacts?: No Does the patient have difficulty concentrating, remembering, or making  decisions?: No Patient able to express need for assistance with ADLs?: Yes Does the patient have difficulty dressing or bathing?: No Independently performs ADLs?: Yes (appropriate for developmental age) Does the patient have difficulty walking or climbing stairs?: No Weakness of Legs: None Weakness of Arms/Hands: None  Affect (typically observed): Accepting   Alcohol / Substance Use: Not Applicable Psych Involvement: No (comment)  Admission diagnosis:  Syncope and collapse [R55] 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 GNR UTI (urinary tract infection) 09/06/2019  . 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 Abnormal CT of the abdomen 02/02/2017  . Rectal bleeding 02/02/2017  . Aortic atherosclerosis ([place]) 01/03/2017  . Colitis 01/02/2017  . Hypokalemia 01/02/2017  . Heme positive stool 06/10/2015  . Change in bowel habits 06/10/2015  . Abdominal pain, epigastric 06/10/2015  . High cholesterol   . Acid reflux   . GERD 01/03/2011  . HEMORRHOIDS 04/15/2010  . Iron deficiency anemia due to chronic blood loss 01/12/2010  . 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 [place] [address] Phone: [phone] Fax: [phone]     Barriers to Discharge: Barriers Resolved   Patient Goals and CMS Choice Patient states their goals for this hospitalization and ongoing recovery are:: to go home. CMS  Medicare.gov Compare Post Acute Care list provided to:: Patient    Discharge Placement         Discharge Plan and Services      HH Arranged: PT [facility] Agency: [place] ([facility]) Date [place]: 09/06/19 Time [place]: [id number] Representative spoke with at [place]: Left message with Melissa  Social Determinants of Health (SDOH)  Interventions     Readmission Risk Interventions No flowsheet data found.

## 2019-09-06 NOTE — Progress Notes (Signed)
*  PRELIMINARY RESULTS* Echocardiogram 2D Echocardiogram has been performed.  Leavy Cella 09/06/2019, 12:06 PM

## 2019-09-06 NOTE — Evaluation (Signed)
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 Pt performing ADLs at modified independent level this am. Reporting occasionally lightheadedness with position changes and during standing tasks. No LOB during session, no DME required. Pt performing dynamic standing tasks with no UE support. Pt is at baseline with ADLs, no further OT services required at this time.     Follow Up Recommendations  No OT follow up    Equipment Recommendations  None recommended by OT       Precautions / Restrictions Precautions Precautions: None Restrictions Weight Bearing Restrictions: No      Mobility Bed Mobility Overal bed mobility: Modified Independent                Transfers Overall transfer level: Modified independent Equipment used: None                      ADL either performed or assessed with clinical judgement   ADL Overall ADL's : Modified independent     Grooming: Wash/dry hands;Modified independent;Standing               Lower Body Dressing: Modified independent;Sitting/lateral leans;Sit to/from stand   Toilet Transfer: Modified Building services engineer and Hygiene: Modified independent;Sitting/lateral lean;Sit to/from stand       Functional mobility during ADLs: Supervision/safety       Vision Baseline Vision/History: Wears glasses Wears Glasses: At all  times Patient Visual Report: No change from baseline Vision Assessment?: Yes Eye Alignment: Within Functional Limits Ocular Range of Motion: Within Functional Limits Alignment/Gaze Preference: Within Defined Limits Tracking/Visual Pursuits: Able to track stimulus in all quads without difficulty Saccades: Within functional limits Convergence: Within functional limits Visual Fields: No apparent deficits            Pertinent Vitals/Pain Pain Assessment: No/denies pain     Hand Dominance Right   Extremity/Trunk Assessment Upper Extremity Assessment Upper Extremity Assessment: Overall WFL for tasks assessed(BUE 4+/5)   Lower Extremity Assessment Lower Extremity Assessment: Defer to PT evaluation   Cervical / Trunk Assessment Cervical / Trunk Assessment: Normal   Communication Communication Communication: No difficulties   Cognition Arousal/Alertness: Awake/alert Behavior During Therapy: WFL for tasks assessed/performed Overall Cognitive Status: Within Functional Limits for tasks assessed                                                Home Living Family/patient expects to be discharged to:: Private residence Living Arrangements: Alone Available Help at Discharge: Family;Available PRN/intermittently Type of Home: Apartment Home Access: Level entry     Home Layout: One level     Bathroom Shower/Tub: Teacher, early years/pre: Standard     Home Equipment: Cane - single point;Shower seat;Grab bars - toilet;Grab bars - tub/shower          Prior Functioning/Environment  Level of Independence: Independent with assistive device(s)        Comments: Pt uses SPC as needed. Independent in ADLs, does not drive         End of Session    Activity Tolerance: Patient tolerated treatment well Patient left: in bed;with call bell/phone within reach  OT Visit Diagnosis: Muscle weakness (generalized) (M62.81)                Time:  [id number] OT Time Calculation (min): 17 min Charges:  OT General Charges $OT Visit: 1 Visit OT Evaluation $OT Eval Low Complexity: [place], OTR/L  [phone] 09/06/2019, 7:47 AM

## 2019-09-06 NOTE — Discharge Instructions (Signed)
1)Please take aspirin 325 mg daily with food-for secondary stroke prevention 2)Please take atorvastatin/Lipitor 40 mg every evening to prevent strokes 3)Please follow-up with [facility] Neu[facility] Dr. Phillips Odor A MD in about 2 to 3 weeks for recheck and reevaluation Address: [address], [place], [place] [address] Phone: [phone] 4)Home health physical therapy will come to your house to help you get stronger and more steady--please use your walker to get around

## 2019-09-06 NOTE — Progress Notes (Signed)
Present with Melody Mitchell for spiritual and emotional support as she seems to be processing this new diagnosis. We discussed her hope and fears as she move through this transition of illness. We talked about her family dynamics and support. Prayed with her also.

## 2019-09-06 NOTE — Progress Notes (Signed)
No deficits from stroke diagnosis.  Patient tearful this morning and received encouragement to talk with chaplain.  Denies any dizziness except when straining to have BM.  Gave prn miralax and later Dr. Denton Brick ordered suppository.  No result ,yet, but states she did have a BM yesterday morning.  Has called sister to give ride home.  IV removed and discharge instructions reviewed.

## 2019-09-06 NOTE — Discharge Summary (Signed)
Melody Mitchell, is a 78 y.o. female  DOB Aug 13, 1941  MRN [id number].  Admission date:  09/05/2019  Admitting Physician  Roxan Hockey, MD  Discharge Date:  09/06/2019   Primary MD  [name], MD  Recommendations for primary care physician for things to follow:    - 1)Please take aspirin 325 mg daily with food-for secondary stroke prevention 2)Please take atorvastatin/Lipitor 40 mg every evening to prevent strokes 3)Please follow-up with [facility] Dr. 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 Acid reflux   . Arthritis   . 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 Bronchitis january 2014  . Hiatal hernia   . High cholesterol   . History of contact dermatitis   . Hx: recurrent pneumonia   . Hypertension   . IDA (iron deficiency anemia)    chronic iron infusions  . Reflux   . Vertigo     Past Surgical History:  Procedure Laterality Date  . ABDOMINAL HYSTERECTOMY    . APPENDECTOMY    . BILATERAL BREAST BIOPSIES  BENIGN    . CHOLECYSTECTOMY    . COLONOSCOPY N/A 06/29/2015   SLF: The left colon is redundant 2. four colon polyps  removed. No source  for change in bowel habits identified 3. Rectal bleeding due to large internal hemorrhoids   . COLONOSCOPY W/ BIOPSIES  6 2010   Dr. Oneida Alar: Polypoid appearing lesion of the ascending colon, 3 mm sessile rectal polyp, small internal hemorrhoids. Pathology revealed polypoid mucosa, no adenomatous changes  . Double-balloon enteroscopy, antegrade  April 2011   Dr. 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  Marland Kitchen ESOPHAGOGASTRODUODENOSCOPY  04/2009   Dr. Oneida Alar: Patent Schatzki ring dilated with advancing the scope, patchy erythema in the antrum, 2 small AVMs in the duodenal bulb, 2 additional AVMs noted in the second portion the duodenum, mild gastritis on pathology  . ESOPHAGOGASTRODUODENOSCOPY N/A 06/29/2015   SLF: 1. 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 small hiatal hernia 3. Mild non-erosive gastririts- NO obvious source for dyspepsia identified.   Marland Kitchen EYE SURGERY        HPI  from the history and physical done on the day of admission:   - Melody Mitchell  is a 78 y.o. 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 Phillips Odor -Physical and occupational therapy evaluation appreciated recommend home health - MRI of the brain noted,  echocardiogram without intracardiac thrombus  and EF is 60 to 65% -  carotid artery Dopplers without hemodynamically significant stenosis  -Neurology consult as outpatient -- TSH is 0.89, no history of diabetes or hyperglycemia LDL 114, HDL 53, T chol 209, Trig 208 -No significant speech or swallowing concerns at this time  2)GERD-stable continue PPI  3)GNR UTI--Rocephin 1 g IV x1, discharged on p.o. Keflex, final ID and sensitivity pending  Discharge Condition: stable  Follow UP--- neurologist as outpatient  Diet and Activity recommendation:  As advised  Discharge Instructions    Discharge Instructions    Call MD for:  difficulty breathing, headache or visual disturbances   Complete by: As directed    Call MD for:  persistant dizziness or light-headedness   Complete by: As directed    Call MD for:  persistant nausea and vomiting   Complete by: As directed    Call MD for:  severe uncontrolled pain   Complete by: As directed    Call MD for:  temperature >100.4   Complete by: As directed    Diet - low sodium heart healthy   Complete by: As directed    Discharge instructions   Complete by: As directed    1)Please take aspirin 325 mg daily with food-for secondary stroke prevention 2)Please take atorvastatin/Lipitor 40 mg every evening to prevent strokes 3)Please follow-up with [facility] Neurologist Dr. Phillips Odor A MD in about 2 to 3 weeks for recheck and reevaluation Address: [address], [place], [place] [address] Phone: [phone] 4)Home health physical therapy will come to your house to help you  get stronger and more steady--please use your walker to get around   Increase activity slowly   Complete by: As directed         Discharge Medications     Allergies as of 09/06/2019      Reactions   Latex Hives, Itching, Rash   Sulfa Antibiotics Hives, Itching   Sulfonamide Derivatives Hives, Itching      Medication List    STOP taking these medications   traMADol 50 MG tablet Commonly known as: ULTRAM     TAKE these medications   acetaminophen 500 MG tablet Commonly known as: TYLENOL Take 1,000 mg by mouth every 6 (six) hours as needed.  amitriptyline 25 MG tablet Commonly known as: ELAVIL Take 25 mg by mouth at bedtime as needed for sleep.   aspirin EC 325 MG tablet Take 1 tablet (325 mg total) by mouth daily with breakfast.   atorvastatin 40 MG tablet Commonly known as: Lipitor Take 1 tablet (40 mg total) by mouth every evening.   cephALEXin 500 MG capsule Commonly known as: Keflex Take 1 capsule (500 mg total) by mouth 3 (three) times daily for 4 days. Start taking on: September 07, 2019   clobetasol cream 0.05 % Commonly known as: TEMOVATE   Combigan 0.2-0.5 % ophthalmic solution Generic drug: brimonidine-timolol Place 1 drop into the left eye every 12 (twelve) hours.   esomeprazole 40 MG capsule Commonly known as: NEXIUM Take 40 mg by mouth daily before breakfast.   fish oil-omega-3 fatty acids 1000 MG capsule Take 1 g by mouth daily.   fluticasone 50 MCG/ACT nasal spray Commonly known as: FLONASE Place 2 sprays into both nostrils daily.   meclizine 25 MG tablet Commonly known as: ANTIVERT Take 1 tablet (25 mg total) by mouth 3 (three) times daily as needed for dizziness. What changed: when to take this   methylcellulose 1 % ophthalmic solution Commonly known as: ARTIFICIAL TEARS Place 1 drop into both eyes 2 (two) times daily as needed (dry eyes).   multivitamin capsule Take 1 capsule by mouth daily.   polyethylene glycol 17 g packet  Commonly known as: MIRALAX / GLYCOLAX Take 17 g by mouth daily as needed for mild constipation.   potassium chloride SA 20 MEQ tablet Commonly known as: KLOR-CON Take 1 tablet (20 mEq total) by mouth daily.   Vitamin D (Ergocalciferol) 1.25 MG (50000 UT) Caps capsule Commonly known as: DRISDOL TAKE 1 CAPSULE BY MOUTH ONCE A WEEK       Major procedures and Radiology Reports - PLEASE review detailed and final reports for all details, in brief -   Mr Brain Wo Contrast  Result Date: 09/05/2019 CLINICAL DATA:  Dizziness and weakness over the last 2-3 days. EXAM: MRI HEAD WITHOUT CONTRAST TECHNIQUE: Multiplanar, multiecho pulse sequences of the brain and surrounding structures were obtained without intravenous contrast. COMPARISON:  Head CT 04/23/2014 FINDINGS: Brain: Diffusion imaging shows a subcentimeter focus of restricted diffusion in the splenium of the corpus callosum just to the left of midline consistent with a small acute infarction. No other acute insult. Elsewhere, brainstem and cerebellum are normal. Cerebral hemispheres show moderate chronic small-vessel ischemic changes of the deep and subcortical white matter. No cortical or large vessel territory infarction. No mass lesion, hemorrhage, hydrocephalus or extra-axial collection. Vascular: Major vessels at the base of the brain show flow. Skull and upper cervical spine: Negative Sinuses/Orbits: Clear/normal Other: None IMPRESSION: Background pattern of moderate chronic small-vessel ischemic changes affecting the brain. 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 Electronically Signed   By: Nelson Chimes M.D.   On: 09/05/2019 14:19   US Carotid Bilateral  Result Date: 09/05/2019 CLINICAL DATA:  78 year old female with syncope, collapse and ataxia EXAM: BILATERAL CAROTID DUPLEX ULTRASOUND TECHNIQUE: Pearline Cables scale imaging, color Doppler and duplex ultrasound were performed of  bilateral carotid and vertebral arteries in the neck. COMPARISON:  Prior carotid duplex ultrasound 04/26/2010; CTA of the neck 07/28/2014 FINDINGS: Criteria: Quantification of carotid stenosis is based on velocity parameters that correlate the residual internal carotid diameter with NASCET-based stenosis levels, using the diameter of the distal internal carotid lumen as  the denominator for stenosis measurement. The following velocity measurements were obtained: RIGHT ICA: 76/30 cm/sec CCA: 99991111 cm/sec SYSTOLIC ICA/CCA RATIO:  1.2 ECA:  50 cm/sec LEFT ICA: 54/23 cm/sec CCA: [id number] cm/sec SYSTOLIC ICA/CCA RATIO:  0.6 ECA:  74 cm/sec RIGHT CAROTID ARTERY: Heterogeneous but smooth atherosclerotic plaque in the proximal internal carotid artery. There is positive remodeling and as such, by peak systolic velocity criteria, the estimated stenosis remains less than 50%. RIGHT VERTEBRAL ARTERY:  Patent with normal antegrade flow. LEFT CAROTID ARTERY: Heterogeneous and partially calcified atherosclerotic plaque in the proximal internal carotid artery. By peak systolic velocity criteria, the estimated stenosis remains less than 50%. LEFT VERTEBRAL ARTERY:  Patent with normal antegrade flow. IMPRESSION: 1. 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 2. Mild (1-49%) stenosis proximal left internal carotid artery secondary to heterogeneous, irregular and partially calcified atherosclerotic plaque. 3. Vertebral arteries are patent with normal antegrade flow. Signed, Criselda Peaches, MD, [facility] Radiology Electronically Signed   By: Jacqulynn Cadet M.D.   On: 09/05/2019 17:01   Dg Chest Portable 1 View  Result Date: 09/05/2019 CLINICAL DATA:  Dizziness and weakness for 2-3 days. EXAM: PORTABLE CHEST 1 VIEW COMPARISON:  PA and lateral chest 03/16/2019 and 12/28/2016. FINDINGS: Lungs are  clear. Heart size is normal. Atherosclerosis noted. No pneumothorax or pleural fluid. IMPRESSION: No acute disease. Electronically Signed   By: Inge Rise M.D.   On: 09/05/2019 12:25    Micro Results   Recent Results (from the past 240 hour(s))  Urine culture     Status: Abnormal (Preliminary result)   Collection Time: 09/05/19  2:32 PM   Specimen: Urine, Clean Catch  Result Value Ref Range Status   Specimen Description   Final    URINE, CLEAN CATCH Performed at [facility], [address] [place], [place] [address]    Special Requests   Final    NONE Performed at [facility], [address] [place], [place] [address]    Culture (A)  Final    >=100,000 COLONIES/mL GRAM NEGATIVE RODS IDENTIFICATION AND SUSCEPTIBILITIES TO FOLLOW Performed at [facility], [place] [address] [place], [place] [address]    Report Status PENDING  Incomplete  SARS CORONAVIRUS 2 (TAT 6-24 HRS) Nasopharyngeal Nasopharyngeal Swab     Status: None   Collection Time: 09/05/19  2:48 PM   Specimen: Nasopharyngeal Swab  Result Value Ref Range Status   SARS Coronavirus 2 NEGATIVE NEGATIVE Final    Comment: (NOTE) SARS-CoV-2 target nucleic acids are NOT DETECTED. The SARS-CoV-2 RNA is generally detectable in upper and lower respiratory specimens during the acute phase of infection. Negative results do not preclude SARS-CoV-2 infection, do not rule out co-infections with other pathogens, and should not be used as the sole basis for treatment or other patient management decisions. Negative results must be combined with clinical observations, patient history, and epidemiological information. The expected result is Negative. Fact Sheet for Patients: SugarRoll.be Fact Sheet for Healthcare Providers: https://www.woods-mathews.com/ This test is not yet approved or cleared by the [place] FDA and  has been authorized for detection and/or diagnosis of  SARS-CoV-2 by FDA under an Emergency Use Authorization (EUA). This EUA will remain  in effect (meaning this test can be used) for the duration of the COVID-19 declaration under Section 56 4(b)(1) of the Act, 21 U.S.C. section 360bbb-3(b)(1), unless the authorization is terminated or revoked sooner. Performed at [facility], [place] [address] [place], [place] [address]        Today   Subjective    Janelli Rittenberg today has no new complaints  -No fevers, no chills, -No chest pains, no palpitations         I called and updated patient's sister Lenoria Farrier at patient's request prior to discharge, questions answered   Patient has been seen and examined prior to discharge   Objective   Blood pressure 93/76, pulse 81, temperature 98.6 F (37 C), temperature source Oral, resp. rate 18, height 5\' 4"  (1.626 m), weight 63.8 kg, SpO2 100 %.   Intake/Output Summary (Last 24 hours) at 09/06/2019 1327 Last data filed at 09/06/2019 0900 Gross per 24 hour  Intake 200 ml  Output -  Net 200 ml   Exam - Physical Examination: General appearance - alert, well appearing, and in no distress   Mental status - alert, oriented to person, place, and time,  Eyes - sclera anicteric Neck - supple, no JVD elevation , Chest - clear  to auscultation bilaterally, symmetrical air movement,  Heart - S1 and S2 normal, regular  Abdomen - soft, nontender, nondistended, no masses or organomegaly Extremities - no pedal edema noted, intact peripheral pulses  Skin - warm, dry NeuroPsych - Neurologial Exam: Mental Status: Patient is awake, alert, oriented to person, place, month, year, and situation. Patient is able to give a clear and coherent history. No signs of aphasia or neglect Cranial Nerves: II: Visual Fields are full. Pupils are equal, round, and reactive to light.  III,IV, VI: EOMI without ptosis or diploplia.  V: Facial sensation issymmetrical and wnl VII: Facial movement is  symmetric.  VIII: hearing is intact to voice X: Uvula elevates symmetrically XI: Shoulder shrug is symmetric. XII: tongue is midline without atrophy or fasciculations.  Motor: Tone is normal. Bulk is normal. 5/5 strength was present in all four extremities.  Sensory: Sensation isnormal to pinprick and to touch Cerebellar: FNFwithout ataxia -Patient with generalized weakness but no significant focal deficits at this time    Data Review   CBC w Diff:  Lab Results  Component Value Date   WBC 4.1 09/05/2019   HGB 12.2 09/05/2019   HCT 39.8 09/05/2019   PLT 285 09/05/2019   LYMPHOPCT 29 08/04/2019   BANDSPCT 0 05/09/2009   MONOPCT 11 08/04/2019   EOSPCT 2 08/04/2019   BASOPCT 0 08/04/2019   CMP:  Lab Results  Component Value Date   NA 139 09/06/2019   K 4.1 09/06/2019   CL 104 09/06/2019   CO2 25 09/06/2019   BUN 13 09/06/2019   CREATININE 0.85 09/06/2019   PROT 7.6 09/05/2019   ALBUMIN 4.2 09/05/2019   BILITOT 0.4 09/05/2019   ALKPHOS 81 09/05/2019   AST 22 09/05/2019   ALT 19 09/05/2019  . Total Discharge time is about 33 minutes  Roxan Hockey M.D on 09/06/2019 at 1:27 PM  Go to [email or web address] -  for contact info  [facility] - Office  [phone]

## 2019-09-06 NOTE — Care Management Important Message (Signed)
Important Message  Patient Details  Name: Melody Mitchell MRN: [id number] Date of Birth: [birth year]   Medicare Important Message Given:  Yes     Tommy Medal 09/06/2019, 1:43 PM

## 2019-09-07 LAB — URINE CULTURE: Culture: >=100000 — AB

## 2019-11-11 ENCOUNTER — Other Ambulatory Visit (HOSPITAL_COMMUNITY): Payer: Self-pay | Admitting: Nurse Practitioner

## 2019-11-11 DIAGNOSIS — E559 Vitamin D deficiency, unspecified: Secondary | ICD-10-CM

## 2019-11-21 ENCOUNTER — Other Ambulatory Visit: Payer: Self-pay

## 2019-11-21 ENCOUNTER — Inpatient Hospital Stay (HOSPITAL_COMMUNITY): Payer: Medicare Other | Attending: Hematology

## 2019-11-21 DIAGNOSIS — Z79899 Other long term (current) drug therapy: Secondary | ICD-10-CM | POA: Diagnosis not present

## 2019-11-21 DIAGNOSIS — D500 Iron deficiency anemia secondary to blood loss (chronic): Secondary | ICD-10-CM | POA: Insufficient documentation

## 2019-11-21 DIAGNOSIS — E538 Deficiency of other specified B group vitamins: Secondary | ICD-10-CM | POA: Insufficient documentation

## 2019-11-21 DIAGNOSIS — Z87891 Personal history of nicotine dependence: Secondary | ICD-10-CM | POA: Diagnosis not present

## 2019-11-21 DIAGNOSIS — R5383 Other fatigue: Secondary | ICD-10-CM | POA: Diagnosis not present

## 2019-11-21 DIAGNOSIS — I10 Essential (primary) hypertension: Secondary | ICD-10-CM | POA: Insufficient documentation

## 2019-11-21 DIAGNOSIS — K921 Melena: Secondary | ICD-10-CM | POA: Insufficient documentation

## 2019-11-21 DIAGNOSIS — K219 Gastro-esophageal reflux disease without esophagitis: Secondary | ICD-10-CM | POA: Insufficient documentation

## 2019-11-21 DIAGNOSIS — E559 Vitamin D deficiency, unspecified: Secondary | ICD-10-CM | POA: Insufficient documentation

## 2019-11-21 DIAGNOSIS — Z7982 Long term (current) use of aspirin: Secondary | ICD-10-CM | POA: Insufficient documentation

## 2019-11-21 DIAGNOSIS — K5520 Angiodysplasia of colon without hemorrhage: Secondary | ICD-10-CM | POA: Insufficient documentation

## 2019-11-21 DIAGNOSIS — E7800 Pure hypercholesterolemia, unspecified: Secondary | ICD-10-CM | POA: Diagnosis not present

## 2019-11-21 DIAGNOSIS — M1990 Unspecified osteoarthritis, unspecified site: Secondary | ICD-10-CM | POA: Diagnosis not present

## 2019-11-21 LAB — IRON AND TIBC
Iron: 36 ug/dL (ref 28–170)
Saturation Ratios: 14 % (ref 10.4–31.8)
TIBC: 261 ug/dL (ref 250–450)
UIBC: 225 ug/dL

## 2019-11-21 LAB — FOLATE: Folate: 7.1 ng/mL (ref >5.9)

## 2019-11-21 LAB — VITAMIN B12: Vitamin B-12: 494 pg/mL (ref 180–914)

## 2019-11-21 LAB — COMPREHENSIVE METABOLIC PANEL
ALT: 13 U/L (ref 0–44)
AST: 19 U/L (ref 15–41)
Albumin: 3.8 g/dL (ref 3.5–5.0)
Alkaline Phosphatase: 62 U/L (ref 38–126)
Anion gap: 7 (ref 5–15)
BUN: 11 mg/dL (ref 8–23)
CO2: 26 mmol/L (ref 22–32)
Calcium: 9.1 mg/dL (ref 8.9–10.3)
Chloride: 108 mmol/L (ref 98–111)
Creatinine, Ser: 0.82 mg/dL (ref 0.44–1.00)
GFR calc Af Amer: >60 mL/min (ref >60)
GFR calc non Af Amer: >60 mL/min (ref >60)
Glucose, Bld: 76 mg/dL (ref 70–99)
Potassium: 4.4 mmol/L (ref 3.5–5.1)
Sodium: 141 mmol/L (ref 135–145)
Total Bilirubin: 0.4 mg/dL (ref 0.3–1.2)
Total Protein: 6.9 g/dL (ref 6.5–8.1)

## 2019-11-21 LAB — CBC WITH DIFFERENTIAL/PLATELET
Abs Immature Granulocytes: 0 10*3/uL (ref 0.00–0.07)
Basophils Absolute: 0 10*3/uL (ref 0.0–0.1)
Basophils Relative: 1 %
Eosinophils Absolute: 0.2 10*3/uL (ref 0.0–0.5)
Eosinophils Relative: 4 %
HCT: 31.3 % — ABNORMAL LOW (ref 36.0–46.0)
Hemoglobin: 9.5 g/dL — ABNORMAL LOW (ref 12.0–15.0)
Immature Granulocytes: 0 %
Lymphocytes Relative: 25 %
Lymphs Abs: 1.1 10*3/uL (ref 0.7–4.0)
MCH: 30.8 pg (ref 26.0–34.0)
MCHC: 30.4 g/dL (ref 30.0–36.0)
MCV: 101.6 fL — ABNORMAL HIGH (ref 80.0–100.0)
Monocytes Absolute: 0.4 10*3/uL (ref 0.1–1.0)
Monocytes Relative: 10 %
Neutro Abs: 2.6 10*3/uL (ref 1.7–7.7)
Neutrophils Relative %: 60 %
Platelets: 302 10*3/uL (ref 150–400)
RBC: 3.08 MIL/uL — ABNORMAL LOW (ref 3.87–5.11)
RDW: 13.4 % (ref 11.5–15.5)
WBC: 4.3 10*3/uL (ref 4.0–10.5)
nRBC: 0 % (ref 0.0–0.2)

## 2019-11-21 LAB — LACTATE DEHYDROGENASE: LDH: 116 U/L (ref 98–192)

## 2019-11-21 LAB — FERRITIN: Ferritin: 186 ng/mL (ref 11–307)

## 2019-11-21 LAB — VITAMIN D 25 HYDROXY (VIT D DEFICIENCY, FRACTURES): Vit D, 25-Hydroxy: 107.61 ng/mL — ABNORMAL HIGH (ref 30–100)

## 2019-11-27 ENCOUNTER — Other Ambulatory Visit: Payer: Self-pay

## 2019-11-28 ENCOUNTER — Inpatient Hospital Stay (HOSPITAL_BASED_OUTPATIENT_CLINIC_OR_DEPARTMENT_OTHER): Payer: Medicare Other | Admitting: Nurse Practitioner

## 2019-11-28 DIAGNOSIS — E559 Vitamin D deficiency, unspecified: Secondary | ICD-10-CM

## 2019-11-28 DIAGNOSIS — E538 Deficiency of other specified B group vitamins: Secondary | ICD-10-CM

## 2019-11-28 DIAGNOSIS — D500 Iron deficiency anemia secondary to blood loss (chronic): Secondary | ICD-10-CM | POA: Diagnosis not present

## 2019-11-28 NOTE — Progress Notes (Signed)
Melody Mitchell, Melody Mitchell   CLINIC:  Medical Oncology/Hematology  PCP:  Melody Schwalbe, MD [address] [place] [address] [phone]   REASON FOR VISIT: Follow-up for iron deficiency anemia  CURRENT THERAPY: Intermittent iron infusions   INTERVAL HISTORY:  Melody Mitchell 79 y.o. female was called for a telephone visit today for iron deficiency anemia.  Patient reports she is starting to feel more fatigued.  She is also having black stools daily.  She is requesting a referral to GI.  Patient denies any bright red bleeding. Denies any nausea, vomiting, or diarrhea. Denies any new pains. Had not noticed any recent bleeding such as epistaxis, hematuria or hematochezia. Denies recent chest pain on exertion, shortness of breath on minimal exertion, pre-syncopal episodes, or palpitations. Denies any numbness or tingling in hands or feet. Denies any recent fevers, infections, or recent hospitalizations. Patient reports appetite at 75% and energy level at 25%.  She has maintain her weight and eating well.    REVIEW OF SYSTEMS:  Review of Systems  Constitutional: Positive for fatigue.  Gastrointestinal: Positive for blood in stool (black stools daily).  All other systems reviewed and are negative.    PAST MEDICAL/SURGICAL HISTORY:  Past Medical History:  Diagnosis Date  . Acid reflux   . Arthritis   . 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 Bronchitis january 2014  . Hiatal hernia   . High cholesterol   . History of contact dermatitis   . Hx: recurrent pneumonia   . Hypertension   . IDA (iron deficiency anemia)    chronic iron infusions  . Reflux   . Vertigo    Past Surgical History:  Procedure Laterality Date  . ABDOMINAL HYSTERECTOMY    . APPENDECTOMY    . BILATERAL BREAST BIOPSIES  BENIGN    . CHOLECYSTECTOMY    . COLONOSCOPY N/A 06/29/2015   [facility]: The left colon is redundant 2. four colon polyps  removed. No source  for change in bowel habits identified 3. Rectal bleeding due to large internal hemorrhoids   . COLONOSCOPY W/ BIOPSIES  6 2010   Dr. Oneida Alar: Polypoid appearing lesion of the ascending colon, 3 mm sessile rectal polyp, small internal hemorrhoids. Pathology revealed polypoid mucosa, no adenomatous changes  . Double-balloon enteroscopy, antegrade  April 2011   Dr. 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  Marland Kitchen ESOPHAGOGASTRODUODENOSCOPY  04/2009   Dr. Oneida Alar: Patent Schatzki ring dilated with advancing the scope, patchy erythema in the antrum, 2 small AVMs in the duodenal bulb, 2 additional AVMs noted in the second portion the duodenum, mild gastritis on pathology  . ESOPHAGOGASTRODUODENOSCOPY N/A 06/29/2015   [facility]: 1. 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 small hiatal hernia 3. Mild non-erosive gastririts- NO obvious source for dyspepsia identified.   Marland Kitchen EYE SURGERY       SOCIAL HISTORY:  Social History   Socioeconomic History  . Marital status: Divorced    Spouse name: Not on file  . Number of children: 1  . Years of education: Not on file  . Highest education level: Not on file  Occupational History  . Not on file  Tobacco Use  . Smoking status: Former Smoker    Packs/day: 1.00    Years: 25.00    Pack years: 25.00    Types: Cigarettes    Quit date: 09/14/2007    Years since quitting:  12.2  . Smokeless tobacco: Never Used  Substance and Sexual Activity  . Alcohol use: Yes    Comment: occasional/rare (Brandy every now and then)  . Drug use: No  . Sexual activity: Yes    Birth control/protection: Post-menopausal, Surgical  Other Topics Concern  . Not on file  Social History Narrative  . Not on file   Social Determinants of Health   Financial Resource Strain:   . Difficulty of Paying Living Expenses: Not on file  Food Insecurity:   . Worried About Charity fundraiser in the Last Year: Not on file  . Ran Out of Food in  the Last Year: Not on file  Transportation Needs:   . Lack of Transportation (Medical): Not on file  . Lack of Transportation (Non-Medical): Not on file  Physical Activity:   . Days of Exercise per Week: Not on file  . Minutes of Exercise per Session: Not on file  Stress:   . Feeling of Stress : Not on file  Social Connections:   . Frequency of Communication with Friends and Family: Not on file  . Frequency of Social Gatherings with Friends and Family: Not on file  . Attends Religious Services: Not on file  . Active Member of Clubs or Organizations: Not on file  . Attends Archivist Meetings: Not on file  . Marital Status: Not on file  Intimate Partner Violence:   . Fear of Current or Ex-Partner: Not on file  . Emotionally Abused: Not on file  . Physically Abused: Not on file  . Sexually Abused: Not on file    FAMILY HISTORY:  Family History  Problem Relation Age of Onset  . Heart failure Mother   . Diabetes Sister   . Heart attack Father   . Colon cancer Neg Hx     CURRENT MEDICATIONS:  Outpatient Encounter Medications as of 11/28/2019  Medication Sig  . acetaminophen (TYLENOL) 500 MG tablet Take 1,000 mg by mouth every 6 (six) hours as needed.  Marland Kitchen amitriptyline (ELAVIL) 25 MG tablet Take 25 mg by mouth at bedtime as needed for sleep.   Marland Kitchen aspirin EC 325 MG tablet Take 1 tablet (325 mg total) by mouth daily with breakfast.  . atorvastatin (LIPITOR) 40 MG tablet Take 1 tablet (40 mg total) by mouth every evening.  . clobetasol cream (TEMOVATE) 0.05 %   . COMBIGAN 0.2-0.5 % ophthalmic solution Place 1 drop into the left eye every 12 (twelve) hours.   Marland Kitchen esomeprazole (NEXIUM) 40 MG capsule Take 40 mg by mouth daily before breakfast.    . fish oil-omega-3 fatty acids 1000 MG capsule Take 1 g by mouth daily.   . fluticasone (FLONASE) 50 MCG/ACT nasal spray Place 2 sprays into both nostrils daily.   . meclizine (ANTIVERT) 25 MG tablet Take 1 tablet (25 mg total) by mouth  3 (three) times daily as needed for dizziness. (Patient taking differently: Take 25 mg by mouth daily. )  . methylcellulose (ARTIFICIAL TEARS) 1 % ophthalmic solution Place 1 drop into both eyes 2 (two) times daily as needed (dry eyes).  . Multiple Vitamin (MULTIVITAMIN) capsule Take 1 capsule by mouth daily.    . polyethylene glycol (MIRALAX / GLYCOLAX) 17 g packet Take 17 g by mouth daily as needed for mild constipation.  . potassium chloride SA (KLOR-CON) 20 MEQ tablet Take 1 tablet (20 mEq total) by mouth daily.  . Vitamin D, Ergocalciferol, (DRISDOL) 1.25 MG (50000 UT) CAPS capsule TAKE  1 CAPSULE BY MOUTH ONCE A WEEK   No facility-administered encounter medications on file as of 11/28/2019.    ALLERGIES:  Allergies  Allergen Reactions  . Latex Hives, Itching and Rash  . Sulfa Antibiotics Hives and Itching  . Sulfonamide Derivatives Hives and Itching    Vital signs: -Deferred due to telephone visit   Physical Exam Neurological:     Mental Status: She is oriented to person, place, and time. Mental status is at baseline.  Psychiatric:        Mood and Affect: Mood normal.        Behavior: Behavior normal.        Thought Content: Thought content normal.        Judgment: Judgment normal.   Physical assessment deferred due to telephone visit.   Patient was alert and oriented over the phone and in no acute distress.   LABORATORY DATA:  I have reviewed the labs as listed.  CBC    Component Value Date/Time   WBC 4.3 11/21/2019 1036   RBC 3.08 (L) 11/21/2019 1036   HGB 9.5 (L) 11/21/2019 1036   HCT 31.3 (L) 11/21/2019 1036   PLT 302 11/21/2019 1036   MCV 101.6 (H) 11/21/2019 1036   MCH 30.8 11/21/2019 1036   MCHC 30.4 11/21/2019 1036   RDW 13.4 11/21/2019 1036   LYMPHSABS 1.1 11/21/2019 1036   MONOABS 0.4 11/21/2019 1036   EOSABS 0.2 11/21/2019 1036   BASOSABS 0.0 11/21/2019 1036   CMP Latest Ref Rng & Units 11/21/2019 09/06/2019 09/05/2019  Glucose 70 - 99 mg/dL 76  108(H) 91  BUN 8 - 23 mg/dL 11 13 9   Creatinine 0.44 - 1.00 mg/dL 0.82 0.85 0.93  Sodium 135 - 145 mmol/L 141 139 139  Potassium 3.5 - 5.1 mmol/L 4.4 4.1 4.5  Chloride 98 - 111 mmol/L 108 104 104  CO2 22 - 32 mmol/L 26 25 27   Calcium 8.9 - 10.3 mg/dL 9.1 9.5 9.7  Total Protein 6.5 - 8.1 g/dL 6.9 - 7.6  Total Bilirubin 0.3 - 1.2 mg/dL 0.4 - 0.4  Alkaline Phos 38 - 126 U/L 62 - 81  AST 15 - 41 U/L 19 - 22  ALT 0 - 44 U/L 13 - 19    All questions were answered to patient's stated satisfaction. Encouraged patient to call with any new concerns or questions before his next visit to the cancer center and we can certain see him sooner, if needed.      ASSESSMENT & PLAN:   Iron deficiency anemia due to chronic blood loss 1.  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  She also had a EGD and colonoscopy in 2016 did not show any evidence of active bleeding areas, however she had large internal hemorrhoids. -Last [name] infusion on 07/17/2019 and 07/24/2019. - He denies any bright red bleeding per rectum but does have dark stools every day now. -Labs on 11/21/2019 showed her hemoglobin has dropped to 9.5, platelets 302, ferritin 186, percent saturation 14. -She reports she is still having fatigue and feels like she needs more iron.  She reports the iron infusions help her with energy greatly. -I have recommended her to get 2 more infusions of IV iron. -We will also send her back to GI due to her every day black stools. -We will see her back in 2 months with repeat labs.  2.  Vitamin D deficiency: - Labs on 11/21/2019 showed  her vitamin D level at 107.61 -I will prescribe her vitamin D 50,000 units weekly -I will check her labs at next visit.  3.  Borderline vitamin B12 deficiency: - Labs on 11/21/2019 showed her vitamin B 12 level at 494 - She will continue to take vitamin B 12 daily -We will check her labs at next visit.      Orders placed this  encounter:  Orders Placed This Encounter  Procedures  . Lactate dehydrogenase  . CBC with Differential/Platelet  . Comprehensive metabolic panel  . Ferritin  . Iron and TIBC  . Vitamin B12  . Vitamin D 25 hydroxy  . Folate     Francene Finders, FNP-C [facility] [phone]

## 2019-11-28 NOTE — Assessment & Plan Note (Signed)
1.  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  She also had a EGD and colonoscopy in 2016 did not show any evidence of active bleeding areas, however she had large internal hemorrhoids. -Last [name] infusion on 07/17/2019 and 07/24/2019. - He denies any bright red bleeding per rectum but does have dark stools every day now. -Labs on 11/21/2019 showed her hemoglobin has dropped to 9.5, platelets 302, ferritin 186, percent saturation 14. -She reports she is still having fatigue and feels like she needs more iron.  She reports the iron infusions help her with energy greatly. -I have recommended her to get 2 more infusions of IV iron. -We will also send her back to GI due to her every day black stools. -We will see her back in 2 months with repeat labs.  2.  Vitamin D deficiency: - Labs on 11/21/2019 showed her vitamin D level at 107.61 -I will prescribe her vitamin D 50,000 units weekly -I will check her labs at next visit.  3.  Borderline vitamin B12 deficiency: - Labs on 11/21/2019 showed her vitamin B 12 level at 494 - She will continue to take vitamin B 12 daily -We will check her labs at next visit.

## 2019-12-02 ENCOUNTER — Encounter (HOSPITAL_COMMUNITY): Payer: Self-pay

## 2019-12-02 ENCOUNTER — Other Ambulatory Visit: Payer: Self-pay

## 2019-12-02 ENCOUNTER — Inpatient Hospital Stay (HOSPITAL_COMMUNITY): Payer: Medicare Other

## 2019-12-02 VITALS — BP 102/70 | HR 55 | Temp 96.9°F | Resp 18

## 2019-12-02 DIAGNOSIS — D500 Iron deficiency anemia secondary to blood loss (chronic): Secondary | ICD-10-CM | POA: Diagnosis not present

## 2019-12-02 MED ORDER — SODIUM CHLORIDE 0.9 % IV SOLN
510.0000 mg | Freq: Once | INTRAVENOUS | Status: AC
  Administered 2019-12-02: 13:00:00 510 mg via INTRAVENOUS
  Filled 2019-12-02: qty 510

## 2019-12-02 MED ORDER — SODIUM CHLORIDE 0.9 % IV SOLN: INTRAVENOUS | Status: DC

## 2019-12-02 NOTE — Progress Notes (Signed)
Patient presents today for [name] infusion. Vital signs stable. Patient has no complaints of any changes since her last visit. Patient has pain which she rates a 3/10 in her Right Knee. Chronic arthritic pain.   Treatment given today per MD orders. Tolerated infusion without adverse affects. Vital signs stable. No complaints at this time. Discharged from clinic ambulatory. F/U with [facility] as scheduled.

## 2019-12-02 NOTE — Patient Instructions (Signed)
[facility]  Discharge Instructions:   _______________________________________________________________  Thank you for choosing [facility] at [facility] to provide your oncology and hematology care.  To afford each patient quality time with our providers, please arrive at least 15 minutes before your scheduled appointment.  You need to re-schedule your appointment if you arrive 10 or more minutes late.  We strive to give you quality time with our providers, and arriving late affects you and other patients whose appointments are after yours.  Also, if you no show three or more times for appointments you may be dismissed from the clinic.  Again, thank you for choosing [facility] at [facility]. Our hope is that these requests will allow you access to exceptional care and in a timely manner. _______________________________________________________________  If you have questions after your visit, please contact our office at [phone] between the hours of 8:30 a.m. and 5:00 p.m. Voicemails left after 4:30 p.m. will not be returned until the following business day. _______________________________________________________________  For prescription refill requests, have your pharmacy contact our office. _______________________________________________________________  Recommendations made by the consultant and any test results will be sent to your referring physician. _______________________________________________________________ 

## 2019-12-03 ENCOUNTER — Encounter: Payer: Self-pay | Admitting: Gastroenterology

## 2019-12-13 ENCOUNTER — Encounter (HOSPITAL_COMMUNITY): Payer: Self-pay

## 2019-12-13 ENCOUNTER — Inpatient Hospital Stay (HOSPITAL_COMMUNITY): Payer: Medicare Other

## 2019-12-13 ENCOUNTER — Other Ambulatory Visit: Payer: Self-pay

## 2019-12-13 VITALS — BP 108/60 | HR 58 | Temp 97.5°F | Resp 18

## 2019-12-13 DIAGNOSIS — D500 Iron deficiency anemia secondary to blood loss (chronic): Secondary | ICD-10-CM

## 2019-12-13 MED ORDER — SODIUM CHLORIDE 0.9 % IV SOLN
510.0000 mg | Freq: Once | INTRAVENOUS | Status: AC
  Administered 2019-12-13: 510 mg via INTRAVENOUS
  Filled 2019-12-13: qty 510

## 2019-12-13 MED ORDER — SODIUM CHLORIDE 0.9 % IV SOLN: INTRAVENOUS | Status: DC

## 2019-12-13 NOTE — Patient Instructions (Signed)
[facility] at [facility] Discharge Instructions  Received [name] infusion today. Follow-up as scheduled. Call clinic for any questions or concerns   Thank you for choosing [facility] at [facility] to provide your oncology and hematology care.  To afford each patient quality time with our provider, please arrive at least 15 minutes before your scheduled appointment time.   If you have a lab appointment with the Cancer Center please come in thru the Main Entrance and check in at the main information desk.  You need to re-schedule your appointment should you arrive 10 or more minutes late.  We strive to give you quality time with our providers, and arriving late affects you and other patients whose appointments are after yours.  Also, if you no show three or more times for appointments you may be dismissed from the clinic at the providers discretion.     Again, thank you for choosing [facility].  Our hope is that these requests will decrease the amount of time that you wait before being seen by our physicians.       _____________________________________________________________  Should you have questions after your visit to  Cancer Center, please contact our office at [phone] between the hours of 8:00 a.m. and 4:30 p.m.  Voicemails left after 4:00 p.m. will not be returned until the following business day.  For prescription refill requests, have your pharmacy contact our office and allow 72 hours.    Due to Covid, you will need to wear a mask upon entering the hospital. If you do not have a mask, a mask will be given to you at the Main Entrance upon arrival. For doctor visits, patients may have 1 support person with them. For treatment visits, patients can not have anyone with them due to social distancing guidelines and our immunocompromised population.     

## 2019-12-13 NOTE — Progress Notes (Signed)
Melody Mitchell tolerated [name] infusion well without complaints or incident. Peripheral IV site checked with positive blood return noted prior to and after infusion. VSS upon discharge. Pt discharged self ambulatory using her cane in satisfactory condition

## 2019-12-18 ENCOUNTER — Other Ambulatory Visit: Payer: Self-pay

## 2019-12-18 ENCOUNTER — Encounter: Payer: Self-pay | Admitting: Nurse Practitioner

## 2019-12-18 ENCOUNTER — Ambulatory Visit (INDEPENDENT_AMBULATORY_CARE_PROVIDER_SITE_OTHER): Payer: Medicare Other | Admitting: Nurse Practitioner

## 2019-12-18 ENCOUNTER — Ambulatory Visit: Payer: Medicare Other | Admitting: Nurse Practitioner

## 2019-12-18 VITALS — BP 136/83 | HR 70 | Temp 98.4°F | Ht 64.0 in | Wt 143.2 lb

## 2019-12-18 DIAGNOSIS — D500 Iron deficiency anemia secondary to blood loss (chronic): Secondary | ICD-10-CM

## 2019-12-18 DIAGNOSIS — R195 Other fecal abnormalities: Secondary | ICD-10-CM | POA: Diagnosis not present

## 2019-12-18 NOTE — Assessment & Plan Note (Signed)
The patient notes intermittent dark stools.  Apparently they were more frequent previously.  She does have ongoing iron deficiency anemia and is managed by hematology with IV iron infusions.  Given her dark stools and anemia I think it is best to proceed with EGD as per above.  Follow-up in 6 months.  Continue Nexium.  Continue follow-up with hematology based on their recommendations.

## 2019-12-18 NOTE — Assessment & Plan Note (Signed)
Chronic iron deficiency anemia currently being managed by hematology with IV iron infusions.  Due to dark stools and persistent anemia recommended referral to GI for follow-up.  She does have a history of upper GI bleed which is consistent with recurrent black stools.  No EGD since 2016, which found mild reflux esophagitis.  In 2011 she did have several angiectasia's that were ablated with APC coagulation at [facility].  Most of these were in the duodenal bulb.  Colonoscopy also completed 2016 with rectal bleeding due to large hemorrhoids and recommended repeat in 10 to 15 years the benefits outweigh the risk.  I doubt lower GI bleed given her symptoms and presentation.  Her GERD is asymptomatic on Nexium.  At this point I feel it is best to follow-up with EGD to evaluate for etiology.  Proceed with EGD with Dr. Oneida Alar in near future: the risks, benefits, and alternatives have been discussed with the patient in detail. The patient states understanding and desires to proceed.  The patient is currently on Ultram. The patient is not on any other anticoagulants, anxiolytics, chronic pain medications, antidepressants, antidiabetics, or iron supplements.  Denies alcohol and drug use.  Her last EGD required scant amounts of conscious sedation and this should be appropriate for her procedure this time as well.

## 2019-12-18 NOTE — Progress Notes (Signed)
Referring Provider: Glennie Isle, NP-C Primary Care Physician:  [name], MD Primary GI:  Dr. Oneida Alar  Chief Complaint  Patient presents with  . Follow-up    Dark stools    HPI:   Melody Mitchell is a 79 y.o. female who presents for follow-up on GI bleed.  The patient has not been seen in our office since 05/04/2017 when she was seen for rectal bleeding GERD as well as colitis.  Around that time CT with wall thickening of the transverse colon and negative stool cultures.  Colonoscopy completed 06/29/2015 with 4 polyps removed, rectal bleeding likely due to large internal hemorrhoids.  EGD with small hiatal hernia, mild nonerosive gastritis, no obvious source for dyspepsia.  Recommended avoid triggers, continue Amitiza, Nexium, Preparation H as needed, next colonoscopy in 10 to 15 years if benefits outweigh the risks (2026-2031).  At her last visit she was doing well, completed antibiotics.  No further hematochezia.  Good energy and participating in [facility] center.  No other overt GI complaints.  GERD well controlled on Nexium without significant breakthrough.  Recommend he continue current medications and follow-up in 6 months.  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  CBC with hemoglobin of 12.2.  She was discharged on aspirin 325 mg daily for 3 to 4 weeks and possible de-escalation to 81 mg after that.  Continue Lipitor.  Follow-up with neurology.  The patient has apparently been seen hematology for IDA.  She was last seen 11/28/2019, which was a virtual visit due to coronavirus/COVID-19.  Noted to be more fatigued with black stools daily.  Requesting GI referral.  No hematochezia.  Appetite is 75% and energy level at 25%.  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  [name] last received 07/24/2019.  Labs on 11/21/2019 with hemoglobin dropped to 9.5, ferritin 186, 14% saturation.  Recommended 2 more infusions of IV iron, referral back to GI, follow-up in 2 months.  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  Today she states she's doing ok. Denies abdominal pain. Black stools are intermittent, at least 20% of stools black. Denies oral iron, pepto bismol (though she has in the past). Denies N/V, hematochezia, fever, chills, unintentional weight loss. Denies URI or flu-like symptoms. Denies loss of sense of taste or smell. Not recently tested COVID-19. Has had 1st vaccination for COVID, 2nd shot scheduled for 01/05/20. Denies chest pain, dyspnea, dizziness, lightheadedness, syncope, near syncope. Denies any other upper or lower GI symptoms.  Appetite good, energy level good. Denies weakness/fatigue, dyspnea. GERD well managed on PPI (Nexium).  Past Medical History:  Diagnosis Date  . Acid reflux   . Arthritis   . 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 Bronchitis january 2014  . Hiatal hernia   . High cholesterol   . History of contact dermatitis   . Hx: recurrent pneumonia   . Hypertension   . IDA (iron deficiency anemia)    chronic iron infusions  . Reflux   . Vertigo     Past Surgical History:  Procedure Laterality Date  . ABDOMINAL HYSTERECTOMY    . APPENDECTOMY    .  BILATERAL BREAST BIOPSIES  BENIGN    . CHOLECYSTECTOMY    . COLONOSCOPY N/A 06/29/2015   SLF: The left colon is redundant 2. four colon polyps removed. No source  for change in bowel habits identified 3. Rectal bleeding due to large internal hemorrhoids   . COLONOSCOPY W/ BIOPSIES  6 2010   Dr. Oneida Alar: Polypoid appearing lesion of the ascending colon, 3 mm sessile rectal polyp, small internal hemorrhoids. Pathology  revealed polypoid mucosa, no adenomatous changes  . Double-balloon enteroscopy, antegrade  April 2011   Dr. 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  Marland Kitchen ESOPHAGOGASTRODUODENOSCOPY  04/2009   Dr. Oneida Alar: Patent Schatzki ring dilated with advancing the scope, patchy erythema in the antrum, 2 small AVMs in the duodenal bulb, 2 additional AVMs noted in the second portion the duodenum, mild gastritis on pathology  . ESOPHAGOGASTRODUODENOSCOPY N/A 06/29/2015   SLF: 1. 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 small hiatal hernia 3. Mild non-erosive gastririts- NO obvious source for dyspepsia identified.   Marland Kitchen EYE SURGERY      Current Outpatient Medications  Medication Sig Dispense Refill  . acetaminophen (TYLENOL) 500 MG tablet Take 1,000 mg by mouth every 6 (six) hours as needed.    Marland Kitchen amitriptyline (ELAVIL) 25 MG tablet Take 25 mg by mouth as needed for sleep.     Marland Kitchen aspirin EC 325 MG tablet Take 1 tablet (325 mg total) by mouth daily with breakfast. 30 tablet 0  . atorvastatin (LIPITOR) 40 MG tablet Take 1 tablet (40 mg total) by mouth every evening. 30 tablet 4  . clobetasol cream (TEMOVATE) 0.05 %     . COMBIGAN 0.2-0.5 % ophthalmic solution Place 1 drop into the left eye every 12 (twelve) hours.   99  . escitalopram (LEXAPRO) 10 MG tablet Take 10 mg by mouth daily.    Marland Kitchen esomeprazole (NEXIUM) 40 MG capsule Take 40 mg by mouth daily before breakfast.      . ezetimibe (ZETIA) 10 MG tablet Take 10 mg by mouth daily.    . fish oil-omega-3 fatty acids 1000 MG capsule Take 1 g by mouth daily.     . fluticasone (FLONASE) 50 MCG/ACT nasal spray Place 2 sprays into both nostrils daily.     . meclizine (ANTIVERT) 25 MG tablet Take 1 tablet (25 mg total) by mouth 3 (three) times daily as needed for dizziness. (Patient taking differently: Take 25 mg by mouth as needed. ) 30 tablet 0  . methylcellulose (ARTIFICIAL TEARS) 1 % ophthalmic solution Place 1 drop into both eyes 2  (two) times daily as needed (dry eyes).    . potassium chloride SA (KLOR-CON) 20 MEQ tablet Take 1 tablet (20 mEq total) by mouth daily. 30 tablet 0  . traMADol (ULTRAM) 50 MG tablet Take 50 mg by mouth every 6 (six) hours as needed.    Marland Kitchen VITAMIN B12 TR 1000 MCG TBCR Take 1 tablet by mouth daily.    . Vitamin D, Ergocalciferol, (DRISDOL) 1.25 MG (50000 UT) CAPS capsule TAKE 1 CAPSULE BY MOUTH ONCE A WEEK 4 capsule 11   No current facility-administered medications for this visit.    Allergies as of 12/18/2019 - Review Complete 12/18/2019  Allergen Reaction Noted  . Latex Hives, Itching, and Rash 07/01/2009  . Sulfa antibiotics Hives and Itching 11/21/2013  . Sulfonamide derivatives Hives and Itching     Family History  Problem Relation Age of Onset  . Heart failure Mother   .  Diabetes Sister   . Heart attack Father   . Colon cancer Neg Hx   . Gastric cancer Neg Hx   . Esophageal cancer Neg Hx     Social History   Socioeconomic History  . Marital status: Divorced    Spouse name: Not on file  . Number of children: 1  . Years of education: Not on file  . Highest education level: Not on file  Occupational History  . Not on file  Tobacco Use  . Smoking status: Former Smoker    Packs/day: 1.00    Years: 25.00    Pack years: 25.00    Types: Cigarettes    Quit date: 09/14/2007    Years since quitting: 12.2  . Smokeless tobacco: Never Used  Substance and Sexual Activity  . Alcohol use: Yes    Comment: occasional/rare (Brandy every now and then)  . Drug use: No  . Sexual activity: Yes    Birth control/protection: Post-menopausal, Surgical  Other Topics Concern  . Not on file  Social History Narrative  . Not on file   Social Determinants of Health   Financial Resource Strain:   . Difficulty of Paying Living Expenses: Not on file  Food Insecurity:   . Worried About Charity fundraiser in the Last Year: Not on file  . Ran Out of Food in the Last Year: Not on file    Transportation Needs:   . Lack of Transportation (Medical): Not on file  . Lack of Transportation (Non-Medical): Not on file  Physical Activity:   . Days of Exercise per Week: Not on file  . Minutes of Exercise per Session: Not on file  Stress:   . Feeling of Stress : Not on file  Social Connections:   . Frequency of Communication with Friends and Family: Not on file  . Frequency of Social Gatherings with Friends and Family: Not on file  . Attends Religious Services: Not on file  . Active Member of Clubs or Organizations: Not on file  . Attends Archivist Meetings: Not on file  . Marital Status: Not on file    Review of Systems: General: Negative for anorexia, weight loss, fever, chills, fatigue, weakness. ENT: Negative for hoarseness, difficulty swallowing. CV: Negative for chest pain, angina, palpitations, peripheral edema.  Respiratory: Negative for dyspnea at rest, cough, sputum, wheezing.  GI: See history of present illness. MS: Admits chronic knee pain.  Endo: Negative for unusual weight change.  Heme: Negative for bruising or bleeding. Allergy: Negative for rash or hives.   Physical Exam: BP 136/83   Pulse 70   Temp 98.4 F (36.9 C) (Oral)   Ht 5\' 4"  (1.626 m)   Wt 143 lb 3.2 oz (65 kg)   BMI 24.58 kg/m  General:   Alert and oriented. Pleasant and cooperative. Well-nourished and well-developed.  Eyes:  Without icterus, sclera clear and conjunctiva pink.  Ears:  Normal auditory acuity. Cardiovascular:  S1, S2 present without murmurs appreciated. Extremities without clubbing or edema. Respiratory:  Clear to auscultation bilaterally. No wheezes, rales, or rhonchi. No distress.  Gastrointestinal:  +BS, soft, non-tender and non-distended. No HSM noted. No guarding or rebound. No masses appreciated.  Rectal:  Deferred  Musculoskalatal:  Symmetrical without gross deformities. Neurologic:  Alert and oriented x4;  grossly normal neurologically. Psych:  Alert  and cooperative. Normal mood and affect. Heme/Lymph/Immune: No excessive bruising noted.    12/18/2019 3:19 PM   Disclaimer: This note was dictated with  voice recognition software. Similar sounding words can inadvertently be transcribed and may not be corrected upon review.

## 2019-12-18 NOTE — Patient Instructions (Signed)
Your health issues we discussed today were:   Anemia with dark stools: 1. Continue taking Nexium 2. We will schedule your upper endoscopy for you 3. Further recommendations will be made based on your endoscopy results 4. Call us if you have any worsening or severe bleeding 5. Continue to follow-up with hematology based on your recommendations  Overall I recommend:  1. Continue your other current medications 2. Return for follow-up in 6 months 3. Call us if you have any questions or concerns   ---------------------------------------------------------------  I'm glad you got your COVID vaccine!  Continue to socially distance and wear a mask. Stay healthy!!  ---------------------------------------------------------------   At [facility] we value your feedback. You may receive a survey about your visit today. Please share your experience as we strive to create trusting relationships with our patients to provide genuine, compassionate, quality care.  We appreciate your understanding and patience as we review any laboratory studies, imaging, and other diagnostic tests that are ordered as we care for you. Our office policy is 5 business days for review of these results, and any emergent or urgent results are addressed in a timely manner for your best interest. If you do not hear from our office in 1 week, please contact us.   We also encourage the use of MyChart, which contains your medical information for your review as well. If you are not enrolled in this feature, an access code is on this after visit summary for your convenience. Thank you for allowing [place] to be involved in your care.  It was great to see you today!  I hope you have a great day!!

## 2019-12-18 NOTE — Patient Instructions (Signed)
PA for EGD submitted via [facility] website. Case approved. PA# [id number], valid 03/13/20-06/11/20.

## 2019-12-19 ENCOUNTER — Encounter: Payer: Self-pay | Admitting: Gastroenterology

## 2019-12-19 NOTE — Progress Notes (Signed)
Cc'ed to pcp °

## 2020-01-21 ENCOUNTER — Inpatient Hospital Stay (HOSPITAL_COMMUNITY): Payer: Medicare Other | Attending: Hematology

## 2020-01-21 ENCOUNTER — Other Ambulatory Visit: Payer: Self-pay

## 2020-01-21 DIAGNOSIS — D500 Iron deficiency anemia secondary to blood loss (chronic): Secondary | ICD-10-CM | POA: Diagnosis present

## 2020-01-21 DIAGNOSIS — Z833 Family history of diabetes mellitus: Secondary | ICD-10-CM | POA: Insufficient documentation

## 2020-01-21 DIAGNOSIS — K219 Gastro-esophageal reflux disease without esophagitis: Secondary | ICD-10-CM | POA: Diagnosis not present

## 2020-01-21 DIAGNOSIS — Z8249 Family history of ischemic heart disease and other diseases of the circulatory system: Secondary | ICD-10-CM | POA: Diagnosis not present

## 2020-01-21 DIAGNOSIS — K921 Melena: Secondary | ICD-10-CM | POA: Diagnosis not present

## 2020-01-21 DIAGNOSIS — Z87891 Personal history of nicotine dependence: Secondary | ICD-10-CM | POA: Diagnosis not present

## 2020-01-21 DIAGNOSIS — E559 Vitamin D deficiency, unspecified: Secondary | ICD-10-CM | POA: Diagnosis not present

## 2020-01-21 DIAGNOSIS — Z79899 Other long term (current) drug therapy: Secondary | ICD-10-CM | POA: Insufficient documentation

## 2020-01-21 DIAGNOSIS — I10 Essential (primary) hypertension: Secondary | ICD-10-CM | POA: Diagnosis not present

## 2020-01-21 DIAGNOSIS — E7800 Pure hypercholesterolemia, unspecified: Secondary | ICD-10-CM | POA: Insufficient documentation

## 2020-01-21 DIAGNOSIS — E538 Deficiency of other specified B group vitamins: Secondary | ICD-10-CM | POA: Diagnosis not present

## 2020-01-21 DIAGNOSIS — Z90710 Acquired absence of both cervix and uterus: Secondary | ICD-10-CM | POA: Insufficient documentation

## 2020-01-21 DIAGNOSIS — Z7982 Long term (current) use of aspirin: Secondary | ICD-10-CM | POA: Diagnosis not present

## 2020-01-21 LAB — CBC WITH DIFFERENTIAL/PLATELET
Abs Immature Granulocytes: 0 10*3/uL (ref 0.00–0.07)
Basophils Absolute: 0 10*3/uL (ref 0.0–0.1)
Basophils Relative: 0 %
Eosinophils Absolute: 0.2 10*3/uL (ref 0.0–0.5)
Eosinophils Relative: 5 %
HCT: 33.9 % — ABNORMAL LOW (ref 36.0–46.0)
Hemoglobin: 10.5 g/dL — ABNORMAL LOW (ref 12.0–15.0)
Immature Granulocytes: 0 %
Lymphocytes Relative: 23 %
Lymphs Abs: 1 10*3/uL (ref 0.7–4.0)
MCH: 31.4 pg (ref 26.0–34.0)
MCHC: 31 g/dL (ref 30.0–36.0)
MCV: 101.5 fL — ABNORMAL HIGH (ref 80.0–100.0)
Monocytes Absolute: 0.6 10*3/uL (ref 0.1–1.0)
Monocytes Relative: 13 %
Neutro Abs: 2.6 10*3/uL (ref 1.7–7.7)
Neutrophils Relative %: 59 %
Platelets: 299 10*3/uL (ref 150–400)
RBC: 3.34 MIL/uL — ABNORMAL LOW (ref 3.87–5.11)
RDW: 14.2 % (ref 11.5–15.5)
WBC: 4.5 10*3/uL (ref 4.0–10.5)
nRBC: 0 % (ref 0.0–0.2)

## 2020-01-21 LAB — IRON AND TIBC
Iron: 47 ug/dL (ref 28–170)
Saturation Ratios: 17 % (ref 10.4–31.8)
TIBC: 269 ug/dL (ref 250–450)
UIBC: 222 ug/dL

## 2020-01-21 LAB — COMPREHENSIVE METABOLIC PANEL
ALT: 15 U/L (ref 0–44)
AST: 20 U/L (ref 15–41)
Albumin: 3.9 g/dL (ref 3.5–5.0)
Alkaline Phosphatase: 69 U/L (ref 38–126)
Anion gap: 8 (ref 5–15)
BUN: 16 mg/dL (ref 8–23)
CO2: 27 mmol/L (ref 22–32)
Calcium: 9.3 mg/dL (ref 8.9–10.3)
Chloride: 106 mmol/L (ref 98–111)
Creatinine, Ser: 0.93 mg/dL (ref 0.44–1.00)
GFR calc Af Amer: >60 mL/min (ref >60)
GFR calc non Af Amer: 58 mL/min — ABNORMAL LOW (ref >60)
Glucose, Bld: 79 mg/dL (ref 70–99)
Potassium: 4.3 mmol/L (ref 3.5–5.1)
Sodium: 141 mmol/L (ref 135–145)
Total Bilirubin: 0.6 mg/dL (ref 0.3–1.2)
Total Protein: 7.3 g/dL (ref 6.5–8.1)

## 2020-01-21 LAB — FERRITIN: Ferritin: 304 ng/mL (ref 11–307)

## 2020-01-21 LAB — VITAMIN B12: Vitamin B-12: 407 pg/mL (ref 180–914)

## 2020-01-21 LAB — LACTATE DEHYDROGENASE: LDH: 120 U/L (ref 98–192)

## 2020-01-21 LAB — VITAMIN D 25 HYDROXY (VIT D DEFICIENCY, FRACTURES): Vit D, 25-Hydroxy: 108 ng/mL — ABNORMAL HIGH (ref 30–100)

## 2020-01-21 LAB — FOLATE: Folate: 5.2 ng/mL — ABNORMAL LOW (ref >5.9)

## 2020-01-28 ENCOUNTER — Encounter (HOSPITAL_COMMUNITY): Payer: Self-pay | Admitting: Nurse Practitioner

## 2020-01-28 ENCOUNTER — Inpatient Hospital Stay (HOSPITAL_BASED_OUTPATIENT_CLINIC_OR_DEPARTMENT_OTHER): Payer: Medicare Other | Admitting: Nurse Practitioner

## 2020-01-28 ENCOUNTER — Other Ambulatory Visit: Payer: Self-pay

## 2020-01-28 DIAGNOSIS — D500 Iron deficiency anemia secondary to blood loss (chronic): Secondary | ICD-10-CM | POA: Diagnosis not present

## 2020-01-28 NOTE — Assessment & Plan Note (Addendum)
1.  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  She also had a EGD and colonoscopy in 2016 did not show any evidence of active bleeding areas, however she had large internal hemorrhoids. -Last [name] infusion on 12/02/2019 and 12/13/2019. - He denies any bright red bleeding per rectum but does have dark stools every day now. -Labs on 01/21/2020 showed her hemoglobin 10.5, platelets 299, ferritin 304, percent saturation 17. -She reports the iron infusions help her with energy greatly. -I do not recommend any iron infusions at this time. -Patient followed up with GI and has an appointment for colonoscopy next month due to her dark stools. -We will see her back in 2 months with repeat labs.  2.  Vitamin D deficiency: - Labs on 01/21/2020 showed her vitamin D level at 108.00 -She will continue taking her vitamin D -I will check her labs at next visit.  3.  Borderline vitamin B12 deficiency: - Labs on 01/21/2020 showed her vitamin B 12 level at 407 - She will continue to take vitamin B 12 daily -We will check her labs at next visit.

## 2020-01-28 NOTE — Patient Instructions (Signed)
[facility] at [facility] Discharge Instructions  Follow up in 2 months with labs    Thank you for choosing [facility] at [facility] to provide your oncology and hematology care.  To afford each patient quality time with our provider, please arrive at least 15 minutes before your scheduled appointment time.   If you have a lab appointment with the Cancer Center please come in thru the Main Entrance and check in at the main information desk.  You need to re-schedule your appointment should you arrive 10 or more minutes late.  We strive to give you quality time with our providers, and arriving late affects you and other patients whose appointments are after yours.  Also, if you no show three or more times for appointments you may be dismissed from the clinic at the providers discretion.     Again, thank you for choosing [facility].  Our hope is that these requests will decrease the amount of time that you wait before being seen by our physicians.       _____________________________________________________________  Should you have questions after your visit to  Cancer Center, please contact our office at [phone] between the hours of 8:00 a.m. and 4:30 p.m.  Voicemails left after 4:00 p.m. will not be returned until the following business day.  For prescription refill requests, have your pharmacy contact our office and allow 72 hours.    Due to Covid, you will need to wear a mask upon entering the hospital. If you do not have a mask, a mask will be given to you at the Main Entrance upon arrival. For doctor visits, patients may have 1 support person with them. For treatment visits, patients can not have anyone with them due to social distancing guidelines and our immunocompromised population.      

## 2020-01-28 NOTE — Progress Notes (Signed)
[place], [place] [address]   CLINIC:  Medical Oncology/Hematology  PCP:  [name], MD [address] [place] [address] [phone]   REASON FOR VISIT: Follow-up for iron deficiency anemia due to chronic GI blood loss  CURRENT THERAPY: Intermittent iron infusions   INTERVAL HISTORY:  Melody Mitchell 79 y.o. female returns for routine follow-up for iron deficiency anemia due to chronic GI blood loss.  Patient reports she is still having very dark to black stools.  She has these almost daily.  She reports she is followed by GI.  She is scheduled for colonoscopy next month.  Patient denies any bright red bleeding. Denies any nausea, vomiting, or diarrhea. Denies any new pains. Had not noticed any recent bleeding such as epistaxis, hematuria or hematochezia. Denies recent chest pain on exertion, shortness of breath on minimal exertion, pre-syncopal episodes, or palpitations. Denies any numbness or tingling in hands or feet. Denies any recent fevers, infections, or recent hospitalizations. Patient reports appetite at 100% and energy level at 100%.  She is eating well maintaining her weight at this time.    REVIEW OF SYSTEMS:  Review of Systems  Gastrointestinal: Positive for blood in stool (Black stools) and constipation.  All other systems reviewed and are negative.    PAST MEDICAL/SURGICAL HISTORY:  Past Medical History:  Diagnosis Date  . Acid reflux   . Arthritis   . 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 Bronchitis january 2014  . Hiatal hernia   . High cholesterol   . History of contact dermatitis   . Hx: recurrent pneumonia   . Hypertension   . IDA (iron deficiency anemia)    chronic iron infusions  . Reflux   . Vertigo    Past Surgical History:  Procedure Laterality Date  . ABDOMINAL HYSTERECTOMY    . APPENDECTOMY    . BILATERAL BREAST BIOPSIES  BENIGN    . CHOLECYSTECTOMY    .  COLONOSCOPY N/A 06/29/2015   SLF: The left colon is redundant 2. four colon polyps removed. No source  for change in bowel habits identified 3. Rectal bleeding due to large internal hemorrhoids   . COLONOSCOPY W/ BIOPSIES  6 2010   Dr. Oneida Alar: Polypoid appearing lesion of the ascending colon, 3 mm sessile rectal polyp, small internal hemorrhoids. Pathology revealed polypoid mucosa, no adenomatous changes  . Double-balloon enteroscopy, antegrade  April 2011   Dr. 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  Marland Kitchen ESOPHAGOGASTRODUODENOSCOPY  04/2009   Dr. Oneida Alar: Patent Schatzki ring dilated with advancing the scope, patchy erythema in the antrum, 2 small AVMs in the duodenal bulb, 2 additional AVMs noted in the second portion the duodenum, mild gastritis on pathology  . ESOPHAGOGASTRODUODENOSCOPY N/A 06/29/2015   SLF: 1. 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 small hiatal hernia 3. Mild non-erosive gastririts- NO obvious source for dyspepsia identified.   Marland Kitchen EYE SURGERY       SOCIAL HISTORY:  Social History   Socioeconomic History  . Marital status: Divorced    Spouse name: Not on file  . Number of children: 1  . Years of education: Not on file  . Highest education level: Not on file  Occupational History  . Not on file  Tobacco Use  . Smoking status: Former Smoker    Packs/day: 1.00    Years: 25.00    Pack years: 25.00  Types: Cigarettes    Quit date: 09/14/2007    Years since quitting: 12.3  . Smokeless tobacco: Never Used  Substance and Sexual Activity  . Alcohol use: Yes    Comment: occasional/rare (Brandy every now and then)  . Drug use: No  . Sexual activity: Yes    Birth control/protection: Post-menopausal, Surgical  Other Topics Concern  . Not on file  Social History Narrative  . Not on file   Social Determinants of Health   Financial Resource Strain:   . Difficulty of Paying Living Expenses:   Food Insecurity:   . Worried About  Charity fundraiser in the Last Year:   . Arboriculturist in the Last Year:   Transportation Needs:   . Film/video editor (Medical):   Marland Kitchen Lack of Transportation (Non-Medical):   Physical Activity:   . Days of Exercise per Week:   . Minutes of Exercise per Session:   Stress:   . Feeling of Stress :   Social Connections:   . Frequency of Communication with Friends and Family:   . Frequency of Social Gatherings with Friends and Family:   . Attends Religious Services:   . Active Member of Clubs or Organizations:   . Attends Archivist Meetings:   Marland Kitchen Marital Status:   Intimate Partner Violence:   . Fear of Current or Ex-Partner:   . Emotionally Abused:   Marland Kitchen Physically Abused:   . Sexually Abused:     FAMILY HISTORY:  Family History  Problem Relation Age of Onset  . Heart failure Mother   . Diabetes Sister   . Heart attack Father   . Colon cancer Neg Hx   . Gastric cancer Neg Hx   . Esophageal cancer Neg Hx     CURRENT MEDICATIONS:  Outpatient Encounter Medications as of 01/28/2020  Medication Sig  . amitriptyline (ELAVIL) 25 MG tablet Take 25 mg by mouth as needed for sleep.   Marland Kitchen aspirin EC 325 MG tablet Take 1 tablet (325 mg total) by mouth daily with breakfast.  . atorvastatin (LIPITOR) 40 MG tablet Take 1 tablet (40 mg total) by mouth every evening.  . COMBIGAN 0.2-0.5 % ophthalmic solution Place 1 drop into the left eye every 12 (twelve) hours.   Marland Kitchen escitalopram (LEXAPRO) 10 MG tablet Take 10 mg by mouth daily.  Marland Kitchen esomeprazole (NEXIUM) 40 MG capsule Take 40 mg by mouth daily before breakfast.    . ezetimibe (ZETIA) 10 MG tablet Take 10 mg by mouth daily.  . fish oil-omega-3 fatty acids 1000 MG capsule Take 1 g by mouth daily.   . methylcellulose (ARTIFICIAL TEARS) 1 % ophthalmic solution Place 1 drop into both eyes 2 (two) times daily as needed (dry eyes).  . potassium chloride SA (KLOR-CON) 20 MEQ tablet Take 1 tablet (20 mEq total) by mouth daily.  Marland Kitchen VITAMIN  B12 TR 1000 MCG TBCR Take 1 tablet by mouth daily.  . Vitamin D, Ergocalciferol, (DRISDOL) 1.25 MG (50000 UT) CAPS capsule TAKE 1 CAPSULE BY MOUTH ONCE A WEEK  . acetaminophen (TYLENOL) 500 MG tablet Take 1,000 mg by mouth every 6 (six) hours as needed.  . clobetasol cream (TEMOVATE) 0.05 %   . fluticasone (FLONASE) 50 MCG/ACT nasal spray Place 2 sprays into both nostrils daily.   . meclizine (ANTIVERT) 25 MG tablet Take 1 tablet (25 mg total) by mouth 3 (three) times daily as needed for dizziness. (Patient not taking: Reported on 01/28/2020)  .  traMADol (ULTRAM) 50 MG tablet Take 50 mg by mouth every 6 (six) hours as needed.   No facility-administered encounter medications on file as of 01/28/2020.    ALLERGIES:  Allergies  Allergen Reactions  . Latex Hives, Itching and Rash  . Sulfa Antibiotics Hives and Itching  . Sulfonamide Derivatives Hives and Itching     PHYSICAL EXAM:  ECOG Performance status: 1  Vitals:   01/28/20 1035  BP: 118/80  Pulse: 73  Resp: 19  Temp: (!) 96 F (35.6 C)  SpO2: 100%   Filed Weights   01/28/20 1035  Weight: 143 lb 12.8 oz (65.2 kg)    Physical Exam Constitutional:      Appearance: Normal appearance. She is normal weight.  Cardiovascular:     Rate and Rhythm: Normal rate and regular rhythm.     Heart sounds: Normal heart sounds.  Pulmonary:     Effort: Pulmonary effort is normal.     Breath sounds: Normal breath sounds.  Abdominal:     General: Bowel sounds are normal.     Palpations: Abdomen is soft.  Musculoskeletal:        General: Normal range of motion.  Skin:    General: Skin is warm.  Neurological:     Mental Status: She is alert and oriented to person, place, and time. Mental status is at baseline.  Psychiatric:        Mood and Affect: Mood normal.        Behavior: Behavior normal.        Thought Content: Thought content normal.        Judgment: Judgment normal.      LABORATORY DATA:  I have reviewed the labs as  listed.  CBC    Component Value Date/Time   WBC 4.5 01/21/2020 0918   RBC 3.34 (L) 01/21/2020 0918   HGB 10.5 (L) 01/21/2020 0918   HCT 33.9 (L) 01/21/2020 0918   PLT 299 01/21/2020 0918   MCV 101.5 (H) 01/21/2020 0918   MCH 31.4 01/21/2020 0918   MCHC 31.0 01/21/2020 0918   RDW 14.2 01/21/2020 0918   LYMPHSABS 1.0 01/21/2020 0918   MONOABS 0.6 01/21/2020 0918   EOSABS 0.2 01/21/2020 0918   BASOSABS 0.0 01/21/2020 0918   CMP Latest Ref Rng & Units 01/21/2020 11/21/2019 09/06/2019  Glucose 70 - 99 mg/dL 79 76 108(H)  BUN 8 - 23 mg/dL 16 11 13   Creatinine 0.44 - 1.00 mg/dL 0.93 0.82 0.85  Sodium 135 - 145 mmol/L 141 141 139  Potassium 3.5 - 5.1 mmol/L 4.3 4.4 4.1  Chloride 98 - 111 mmol/L 106 108 104  CO2 22 - 32 mmol/L 27 26 25   Calcium 8.9 - 10.3 mg/dL 9.3 9.1 9.5  Total Protein 6.5 - 8.1 g/dL 7.3 6.9 -  Total Bilirubin 0.3 - 1.2 mg/dL 0.6 0.4 -  Alkaline Phos 38 - 126 U/L 69 62 -  AST 15 - 41 U/L 20 19 -  ALT 0 - 44 U/L 15 13 -     I personally performed a face-to-face visit.  All questions were answered to patient's stated satisfaction. Encouraged patient to call with any new concerns or questions before his next visit to the cancer center and we can certain see him sooner, if needed.     ASSESSMENT & PLAN:   Iron deficiency anemia due to chronic blood loss 1.  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  She also had a EGD and colonoscopy in 2016 did not show any evidence of active bleeding areas, however she had large internal hemorrhoids. -Last [name] infusion on 12/02/2019 and 12/13/2019. - He denies any bright red bleeding per rectum but does have dark stools every day now. -Labs on 01/21/2020 showed her hemoglobin 10.5, platelets 299, ferritin 304, percent saturation 17. -She reports the iron infusions help her with energy greatly. -I do not recommend any iron infusions at this time. -Patient followed up with GI and has an  appointment for colonoscopy next month due to her dark stools. -We will see her back in 2 months with repeat labs.  2.  Vitamin D deficiency: - Labs on 01/21/2020 showed her vitamin D level at 108.00 -She will continue taking her vitamin D -I will check her labs at next visit.  3.  Borderline vitamin B12 deficiency: - Labs on 01/21/2020 showed her vitamin B 12 level at 407 - She will continue to take vitamin B 12 daily -We will check her labs at next visit.      Orders placed this encounter:  Orders Placed This Encounter  Procedures  . Lactate dehydrogenase  . CBC with Differential/Platelet  . Comprehensive metabolic panel  . Ferritin  . Iron and TIBC  . Vitamin B12  . VITAMIN D 25 Hydroxy (Vit-D Deficiency, Fractures)  . Folate      Francene Finders, FNP-C [place] [phone]

## 2020-02-17 ENCOUNTER — Emergency Department (HOSPITAL_COMMUNITY): Payer: Medicare Other

## 2020-02-17 ENCOUNTER — Other Ambulatory Visit: Payer: Self-pay

## 2020-02-17 ENCOUNTER — Emergency Department (HOSPITAL_COMMUNITY)
Admission: EM | Admit: 2020-02-17 | Discharge: 2020-02-17 | Disposition: A | Discharge status: Home / Self Care | Payer: Medicare Other | Attending: Emergency Medicine | Admitting: Emergency Medicine

## 2020-02-17 ENCOUNTER — Encounter (HOSPITAL_COMMUNITY): Payer: Self-pay | Admitting: Emergency Medicine

## 2020-02-17 DIAGNOSIS — Z87891 Personal history of nicotine dependence: Secondary | ICD-10-CM | POA: Insufficient documentation

## 2020-02-17 DIAGNOSIS — I10 Essential (primary) hypertension: Secondary | ICD-10-CM | POA: Insufficient documentation

## 2020-02-17 DIAGNOSIS — R1013 Epigastric pain: Secondary | ICD-10-CM | POA: Insufficient documentation

## 2020-02-17 DIAGNOSIS — Z7982 Long term (current) use of aspirin: Secondary | ICD-10-CM | POA: Insufficient documentation

## 2020-02-17 DIAGNOSIS — Z79899 Other long term (current) drug therapy: Secondary | ICD-10-CM | POA: Insufficient documentation

## 2020-02-17 DIAGNOSIS — Z91040 Latex allergy status: Secondary | ICD-10-CM | POA: Insufficient documentation

## 2020-02-17 LAB — CBC WITH DIFFERENTIAL/PLATELET
Abs Immature Granulocytes: 0.01 10*3/uL (ref 0.00–0.07)
Basophils Absolute: 0 10*3/uL (ref 0.0–0.1)
Basophils Relative: 1 %
Eosinophils Absolute: 0.3 10*3/uL (ref 0.0–0.5)
Eosinophils Relative: 6 %
HCT: 32.1 % — ABNORMAL LOW (ref 36.0–46.0)
Hemoglobin: 9.6 g/dL — ABNORMAL LOW (ref 12.0–15.0)
Immature Granulocytes: 0 %
Lymphocytes Relative: 23 %
Lymphs Abs: 1.2 10*3/uL (ref 0.7–4.0)
MCH: 30.6 pg (ref 26.0–34.0)
MCHC: 29.9 g/dL — ABNORMAL LOW (ref 30.0–36.0)
MCV: 102.2 fL — ABNORMAL HIGH (ref 80.0–100.0)
Monocytes Absolute: 0.4 10*3/uL (ref 0.1–1.0)
Monocytes Relative: 8 %
Neutro Abs: 3.2 10*3/uL (ref 1.7–7.7)
Neutrophils Relative %: 62 %
Platelets: 246 10*3/uL (ref 150–400)
RBC: 3.14 MIL/uL — ABNORMAL LOW (ref 3.87–5.11)
RDW: 14.4 % (ref 11.5–15.5)
WBC: 5.1 10*3/uL (ref 4.0–10.5)
nRBC: 0 % (ref 0.0–0.2)

## 2020-02-17 LAB — COMPREHENSIVE METABOLIC PANEL
ALT: 16 U/L (ref 0–44)
AST: 21 U/L (ref 15–41)
Albumin: 3.7 g/dL (ref 3.5–5.0)
Alkaline Phosphatase: 62 U/L (ref 38–126)
Anion gap: 7 (ref 5–15)
BUN: 12 mg/dL (ref 8–23)
CO2: 26 mmol/L (ref 22–32)
Calcium: 8.7 mg/dL — ABNORMAL LOW (ref 8.9–10.3)
Chloride: 108 mmol/L (ref 98–111)
Creatinine, Ser: 0.99 mg/dL (ref 0.44–1.00)
GFR calc Af Amer: >60 mL/min (ref >60)
GFR calc non Af Amer: 53 mL/min — ABNORMAL LOW (ref >60)
Glucose, Bld: 90 mg/dL (ref 70–99)
Potassium: 4.4 mmol/L (ref 3.5–5.1)
Sodium: 141 mmol/L (ref 135–145)
Total Bilirubin: 0.3 mg/dL (ref 0.3–1.2)
Total Protein: 6.6 g/dL (ref 6.5–8.1)

## 2020-02-17 LAB — LIPASE, BLOOD: Lipase: 73 U/L — ABNORMAL HIGH (ref 11–51)

## 2020-02-17 LAB — TROPONIN I (HIGH SENSITIVITY)
Troponin I (High Sensitivity): 3 ng/L (ref <18)
Troponin I (High Sensitivity): 3 ng/L (ref <18)

## 2020-02-17 MED ORDER — SUCRALFATE 1 GM/10ML PO SUSP: 1.0000 g | Freq: Three times a day (TID) | ORAL | 0 refills | Status: DC

## 2020-02-17 NOTE — ED Triage Notes (Signed)
PT states she started having epigastric pain after lunch and called her friend to drive her [facility]. [facility] brought patient in and patient states she is now pain free and states she had 325mg  of aspirin with her routine medications this morning.

## 2020-02-17 NOTE — ED Provider Notes (Signed)
[facility] EMERGENCY DEPARTMENT Provider Note   CSN: [id number] Arrival date & time: 02/17/20  1325     History Chief Complaint  Patient presents with  . Abdominal Pain    Melody Mitchell is a 79 y.o. female with a history of hypertension, acid reflux, hypercholesterolemia, hiatal hernia, prior stroke, and prior abdominal surgeries including appendectomy, cholecystectomy, and hysterectomy who presents to the emergency department with complaints of abdominal pain which occurred at 10AM and is resolved at present. Patient states that she ate cereal and a banana for breakfast and then was sweeping her home, not overly exerting herself, when she developed non-radiating pain to the epigastric area with associated nausea and a generalized fullness to the abdomen. She took an Scientist, research (life sciences) with relief of pain. Sxs concerned her so she wanted to get checked out. Denies chest pain, fever, chills, emesis, diaphoresis, dyspnea, diarrhea, or melena. Last BM was yesterday and normal, usually has BM daily.   HPI     Past Medical History:  Diagnosis Date  . Acid reflux   . Arthritis   . 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 Bronchitis january 2014  . Hiatal hernia   . High cholesterol   . History of contact dermatitis   . Hx: recurrent pneumonia   . Hypertension   . IDA (iron deficiency anemia)    chronic iron infusions  . Reflux   . Vertigo     Patient Active Problem List   Diagnosis Date Noted  . Dark stools 12/18/2019  . GNR UTI (urinary tract infection) 09/06/2019  . 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 Abnormal CT of the abdomen 02/02/2017  . Rectal bleeding 02/02/2017  . Aortic atherosclerosis ([place]) 01/03/2017  . Colitis 01/02/2017  . Hypokalemia 01/02/2017  . Heme positive stool 06/10/2015  . Change in bowel habits 06/10/2015  . Abdominal pain, epigastric 06/10/2015  . High cholesterol   . Acid reflux   . GERD 01/03/2011  . HEMORRHOIDS  04/15/2010  . Iron deficiency anemia due to chronic blood loss 01/12/2010  . 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 ABDOMINAL HYSTERECTOMY    . APPENDECTOMY    . BILATERAL BREAST BIOPSIES  BENIGN    . CHOLECYSTECTOMY    . COLONOSCOPY N/A 06/29/2015   SLF: The left colon is redundant 2. four colon polyps removed. No source  for change in bowel habits identified 3. Rectal bleeding due to large internal hemorrhoids   . COLONOSCOPY W/ BIOPSIES  6 2010   Dr. Oneida Alar: Polypoid appearing lesion of the ascending colon, 3 mm sessile rectal polyp, small internal hemorrhoids. Pathology revealed polypoid mucosa, no adenomatous changes  . Double-balloon enteroscopy, antegrade  April 2011   Dr. 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  Marland Kitchen ESOPHAGOGASTRODUODENOSCOPY  04/2009   Dr. Oneida Alar: Patent Schatzki ring dilated with advancing the scope, patchy erythema in the antrum, 2 small AVMs in the duodenal bulb, 2 additional AVMs noted in the second portion the duodenum, mild gastritis on pathology  . ESOPHAGOGASTRODUODENOSCOPY N/A 06/29/2015   SLF: 1. 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 small hiatal hernia 3. Mild non-erosive gastririts- NO obvious source for dyspepsia identified.   Marland Kitchen EYE SURGERY       OB History    Gravida  2   Para  2   Term  2   Preterm  AB      Living  1     SAB      TAB      Ectopic      Multiple      Live Births              Family History  Problem Relation Age of Onset  . Heart failure Mother   . Diabetes Sister   . Heart attack Father   . Colon cancer Neg Hx   . Gastric cancer Neg Hx   . Esophageal cancer Neg Hx     Social History   Tobacco Use  . Smoking status: Former Smoker    Packs/day: 1.00    Years: 25.00    Pack years: 25.00    Types: Cigarettes    Quit date: 09/14/2007    Years since quitting: 12.4  . Smokeless  tobacco: Never Used  Substance Use Topics  . Alcohol use: Yes    Comment: occasional/rare (Brandy every now and then)  . Drug use: No    Home Medications Prior to Admission medications   Medication Sig Start Date End Date Taking? Authorizing Provider  acetaminophen (TYLENOL) 500 MG tablet Take 1,000 mg by mouth every 6 (six) hours as needed.    [provider]  amitriptyline (ELAVIL) 25 MG tablet Take 25 mg by mouth as needed for sleep.     [provider]  aspirin EC 325 MG tablet Take 1 tablet (325 mg total) by mouth daily with breakfast. 09/06/19 09/05/20  Roxan Hockey, MD  atorvastatin (LIPITOR) 40 MG tablet Take 1 tablet (40 mg total) by mouth every evening. 09/06/19 09/05/20  Roxan Hockey, MD  clobetasol cream (TEMOVATE) 0.05 %  04/01/19   [provider]  COMBIGAN 0.2-0.5 % ophthalmic solution Place 1 drop into the left eye every 12 (twelve) hours.  10/08/18   [provider]  escitalopram (LEXAPRO) 10 MG tablet Take 10 mg by mouth daily. 11/11/19   [provider]  esomeprazole (NEXIUM) 40 MG capsule Take 40 mg by mouth daily before breakfast.      [provider]  ezetimibe (ZETIA) 10 MG tablet Take 10 mg by mouth daily. 11/11/19   [provider]  fish oil-omega-3 fatty acids 1000 MG capsule Take 1 g by mouth daily.     [provider]  fluticasone (FLONASE) 50 MCG/ACT nasal spray Place 2 sprays into both nostrils daily.  04/11/13   [provider]  meclizine (ANTIVERT) 25 MG tablet Take 1 tablet (25 mg total) by mouth 3 (three) times daily as needed for dizziness. Patient not taking: Reported on 01/28/2020 09/13/16   Rolland Porter, MD  methylcellulose (ARTIFICIAL TEARS) 1 % ophthalmic solution Place 1 drop into both eyes 2 (two) times daily as needed (dry eyes).    [provider]  potassium chloride SA (KLOR-CON) 20 MEQ tablet Take 1 tablet (20 mEq total) by mouth daily. 09/06/19   Roxan Hockey, MD  traMADol (ULTRAM) 50 MG tablet Take 50 mg by mouth every 6 (six) hours as needed. 12/05/19   [provider]  VITAMIN B12 TR 1000 MCG TBCR Take 1 tablet by mouth daily. 11/11/19   [provider]  Vitamin D, Ergocalciferol, (DRISDOL) 1.25 MG (50000 UT) CAPS capsule TAKE 1 CAPSULE BY MOUTH ONCE A WEEK 11/11/19   Lockamy, Randi L, NP-C    Allergies    Latex, Sulfa antibiotics, and Sulfonamide derivatives  Review of Systems   Review of  Systems  Constitutional: Negative for chills, diaphoresis and fever.  Respiratory: Negative for shortness of breath.   Cardiovascular: Negative for chest pain.  Gastrointestinal: Positive for abdominal pain and nausea. Negative for blood in stool, constipation, diarrhea and vomiting.  Genitourinary: Negative for dysuria, frequency and urgency.  Neurological: Negative for syncope, weakness, numbness and headaches.  All other systems reviewed and are negative.   Physical Exam Updated Vital Signs BP 115/90 (BP Location: Right Arm)   Pulse 69   Temp 98.5 F (36.9 C) (Oral)   Resp 18   Ht 5\' 4"  (1.626 m)   Wt 69.9 kg   SpO2 97%   BMI 26.43 kg/m   Physical Exam Vitals and nursing note reviewed.  Constitutional:      General: She is not in acute distress.    Appearance: She is well-developed. She is not toxic-appearing.  HENT:     Head: Normocephalic and atraumatic.  Eyes:     General:        Right eye: No discharge.        Left eye: No discharge.     Conjunctiva/sclera: Conjunctivae normal.  Cardiovascular:     Rate and Rhythm: Normal rate and regular rhythm.     Pulses:          Radial pulses are 2+ on the right side and 2+ on the left side.  Pulmonary:     Effort: Pulmonary effort is normal. No respiratory distress.     Breath sounds: Normal breath sounds. No wheezing, rhonchi or rales.  Chest:     Chest wall: No tenderness.  Abdominal:     General: There is no distension.     Palpations: Abdomen is soft.       Tenderness: There is no abdominal tenderness. There is no guarding or rebound.  Musculoskeletal:     Cervical back: Neck supple.  Skin:    General: Skin is warm and dry.     Findings: No rash.  Neurological:     Mental Status: She is alert.     Comments: Clear speech.   Psychiatric:        Behavior: Behavior normal.     ED Results / Procedures / Treatments   Labs (all labs ordered are listed, but only abnormal results are displayed) Labs Reviewed  COMPREHENSIVE METABOLIC PANEL - Abnormal; Notable for the following components:      Result Value   Calcium 8.7 (*)    GFR calc non Af Amer 53 (*)    All other components within normal limits  LIPASE, BLOOD - Abnormal; Notable for the following components:   Lipase 73 (*)    All other components within normal limits  CBC WITH DIFFERENTIAL/PLATELET - Abnormal; Notable for the following components:   RBC 3.14 (*)    Hemoglobin 9.6 (*)    HCT 32.1 (*)    MCV 102.2 (*)    MCHC 29.9 (*)    All other components within normal limits  TROPONIN I (HIGH SENSITIVITY)  TROPONIN I (HIGH SENSITIVITY)    EKG None  Radiology DG Chest 2 View  Result Date: 02/17/2020 CLINICAL DATA:  Epigastric region pain.  Hypertensive. EXAM: CHEST - 2 VIEW COMPARISON:  September 05, 2019 FINDINGS: There is slight left base atelectasis. Lungs otherwise are clear. Heart size and pulmonary vascularity are normal. Aorta is tortuous with aortic atherosclerosis. No adenopathy. No pneumothorax. No bone lesions. IMPRESSION: Slight left base atelectasis. Lungs otherwise clear. Heart size within normal limits. Aorta  tortuous with aortic atherosclerosis. Appearance of the aorta is likely secondary to chronic hypertensive change. No adenopathy. Electronically Signed   By: Lowella Grip III M.D.   On: 02/17/2020 15:11    Procedures Procedures (including critical care time)  Medications Ordered in ED Medications - No data to display  ED Course  I have reviewed  the triage vital signs and the nursing notes.  Pertinent labs & imaging results that were available during my care of the patient were reviewed by me and considered in my medical decision making (see chart for details).    MDM Rules/Calculators/A&P                     This patient presents to the ED for concern of epigastric abdominal pain that began around 10AM and is resolved upon arrival. Patient is nontoxic, vitals WNL. Exam is benign, abdomen is nontender to palpation without peritoneal signs. DDX: GERD, PUD, pancreatitis, cholelithiasis, atypical ACS, bowel obstruction, dissection. EKG; No STEMI.   Additional history obtained:  Previous records obtained and reviewed.   Lab Tests:  I Ordered, reviewed, and interpreted labs, which included:  CBC: Anemia similar to prior ranges on chart review. No leukocytosis.  CMP: Mild hypocalcemia- no significant electrolyte derangement. LFTs WNL.  Lipase: mild elevation @ 73 similar to prior but slightly more elevated.  Troponin: No significant elevation with delta troponin, flat.   Imaging Studies ordered:  I ordered imaging studies which included CXR, I independently visualized and interpreted imaging which showed no pneumonia, pneumothorax, or widened mediastinum. Aorta tortuous with aortic atherosclerosis- appearance of the aorta is likely secondary to chronic hypertensive change. No adenopathy.   Reevaluation:  On re-assessment of the patient at 18:19 she remains resting comfortably and pain free, repeat abdominal exam remains without peritoneal signs.   Overall reassuring work-up in the emergency department.  EKG not consistent with STEMI, troponins are flat, do not suspect ACS. No widened mediastinum on chest x-ray, symmetric pulses, pain-free in the ED, doubt dissection.  Labs with mild lipase elevation, but similar to prior, abdomen remains nontender without peritoneal signs, do not suspect significant acute pancreatitis, additionally do   not suspect cholecystitis, choledocholithiasis, perforation, obstruction given her reassuring serial abdominal exams.  Given patient remained pain-free with reassuring reassessments feel she is appropriate for discharge home, will send home with Carafate with PCP follow-up. I discussed results, treatment plan, need for follow-up, and return precautions with the patient. Provided opportunity for questions, patient confirmed understanding and is in agreement with plan.   This is a shared visit with supervising physician Dr. Sedonia Small who has independently evaluated patient & provided guidance in evaluation/management/disposition, in agreement with care   Portions of this note were generated with Dragon dictation software. Dictation errors may occur despite best attempts at proofreading.  Final Clinical Impression(s) / ED Diagnoses Final diagnoses:  Epigastric pain    Rx / DC Orders ED Discharge Orders         Ordered    sucralfate (CARAFATE) 1 GM/10ML suspension  3 times daily with meals & bedtime     02/17/20 1824           Amaryllis Dyke, PA-C 02/17/20 1825    Maudie Flakes, MD 02/18/20 [phone]

## 2020-02-17 NOTE — Discharge Instructions (Addendum)
You were seen in the emergency department today for abdominal pain.  Your work-up was overall reassuring.  Your labs did show that your lipase was mildly elevated, this evaluates the pancreas, this is similar to prior labs you have had done with slightly more elevated, please have this rechecked by your primary care provider.  We are sending you with Carafate to take prior to meals and prior to bedtime to help coat your stomach in case this is an acidity problem.  We have prescribed you new medication(s) today. Discuss the medications prescribed today with your pharmacist as they can have adverse effects and interactions with your other medicines including over the counter and prescribed medications. Seek medical evaluation if you start to experience new or abnormal symptoms after taking one of these medicines, seek care immediately if you start to experience difficulty breathing, feeling of your throat closing, facial swelling, or rash as these could be indications of a more serious allergic reaction  Please follow-up with your primary care provider for recheck of your symptoms within 3 days.  Return to the ER for new or worsening symptoms including but not limited to return of pain, trouble breathing, passing out, numbness, weakness, inability to keep fluids down, fever, or any other concerns.

## 2020-03-09 ENCOUNTER — Emergency Department (HOSPITAL_COMMUNITY): Payer: Medicare Other

## 2020-03-09 ENCOUNTER — Other Ambulatory Visit: Payer: Self-pay

## 2020-03-09 ENCOUNTER — Emergency Department (HOSPITAL_COMMUNITY)
Admission: EM | Admit: 2020-03-09 | Discharge: 2020-03-09 | Disposition: A | Discharge status: Home / Self Care | Payer: Medicare Other | Attending: Emergency Medicine | Admitting: Emergency Medicine

## 2020-03-09 ENCOUNTER — Encounter (HOSPITAL_COMMUNITY): Payer: Self-pay

## 2020-03-09 DIAGNOSIS — I10 Essential (primary) hypertension: Secondary | ICD-10-CM | POA: Insufficient documentation

## 2020-03-09 DIAGNOSIS — Z79899 Other long term (current) drug therapy: Secondary | ICD-10-CM | POA: Diagnosis not present

## 2020-03-09 DIAGNOSIS — M25562 Pain in left knee: Secondary | ICD-10-CM | POA: Diagnosis not present

## 2020-03-09 DIAGNOSIS — R42 Dizziness and giddiness: Secondary | ICD-10-CM | POA: Diagnosis not present

## 2020-03-09 DIAGNOSIS — Z91040 Latex allergy status: Secondary | ICD-10-CM | POA: Diagnosis not present

## 2020-03-09 DIAGNOSIS — Z87891 Personal history of nicotine dependence: Secondary | ICD-10-CM | POA: Diagnosis not present

## 2020-03-09 DIAGNOSIS — Z7982 Long term (current) use of aspirin: Secondary | ICD-10-CM | POA: Diagnosis not present

## 2020-03-09 DIAGNOSIS — M79652 Pain in left thigh: Secondary | ICD-10-CM | POA: Diagnosis not present

## 2020-03-09 LAB — CBC WITH DIFFERENTIAL/PLATELET
Abs Immature Granulocytes: 0.02 10*3/uL (ref 0.00–0.07)
Basophils Absolute: 0 10*3/uL (ref 0.0–0.1)
Basophils Relative: 1 %
Eosinophils Absolute: 0.2 10*3/uL (ref 0.0–0.5)
Eosinophils Relative: 4 %
HCT: 30.2 % — ABNORMAL LOW (ref 36.0–46.0)
Hemoglobin: 9.2 g/dL — ABNORMAL LOW (ref 12.0–15.0)
Immature Granulocytes: 1 %
Lymphocytes Relative: 25 %
Lymphs Abs: 1.1 10*3/uL (ref 0.7–4.0)
MCH: 30.2 pg (ref 26.0–34.0)
MCHC: 30.5 g/dL (ref 30.0–36.0)
MCV: 99 fL (ref 80.0–100.0)
Monocytes Absolute: 0.4 10*3/uL (ref 0.1–1.0)
Monocytes Relative: 10 %
Neutro Abs: 2.5 10*3/uL (ref 1.7–7.7)
Neutrophils Relative %: 59 %
Platelets: 300 10*3/uL (ref 150–400)
RBC: 3.05 MIL/uL — ABNORMAL LOW (ref 3.87–5.11)
RDW: 14.3 % (ref 11.5–15.5)
WBC: 4.2 10*3/uL (ref 4.0–10.5)
nRBC: 0 % (ref 0.0–0.2)

## 2020-03-09 LAB — URINALYSIS, ROUTINE W REFLEX MICROSCOPIC
Bacteria, UA: NONE SEEN
Bilirubin Urine: NEGATIVE
Glucose, UA: NEGATIVE mg/dL
Hgb urine dipstick: NEGATIVE
Ketones, ur: NEGATIVE mg/dL
Nitrite: NEGATIVE
Protein, ur: NEGATIVE mg/dL
Specific Gravity, Urine: 1.014 (ref 1.005–1.030)
pH: 5 (ref 5.0–8.0)

## 2020-03-09 LAB — COMPREHENSIVE METABOLIC PANEL
ALT: 16 U/L (ref 0–44)
AST: 21 U/L (ref 15–41)
Albumin: 3.7 g/dL (ref 3.5–5.0)
Alkaline Phosphatase: 66 U/L (ref 38–126)
Anion gap: 8 (ref 5–15)
BUN: 11 mg/dL (ref 8–23)
CO2: 27 mmol/L (ref 22–32)
Calcium: 8.8 mg/dL — ABNORMAL LOW (ref 8.9–10.3)
Chloride: 105 mmol/L (ref 98–111)
Creatinine, Ser: 0.83 mg/dL (ref 0.44–1.00)
GFR calc Af Amer: >60 mL/min (ref >60)
GFR calc non Af Amer: >60 mL/min (ref >60)
Glucose, Bld: 80 mg/dL (ref 70–99)
Potassium: 4 mmol/L (ref 3.5–5.1)
Sodium: 140 mmol/L (ref 135–145)
Total Bilirubin: 0.4 mg/dL (ref 0.3–1.2)
Total Protein: 6.8 g/dL (ref 6.5–8.1)

## 2020-03-09 LAB — CBG MONITORING, ED: Glucose-Capillary: 81 mg/dL (ref 70–99)

## 2020-03-09 MED ORDER — SODIUM CHLORIDE 0.9 % IV BOLUS
500.0000 mL | Freq: Once | INTRAVENOUS | Status: AC
  Administered 2020-03-09: 500 mL via INTRAVENOUS

## 2020-03-09 MED ORDER — ACETAMINOPHEN 325 MG PO TABS
650.0000 mg | ORAL_TABLET | Freq: Once | ORAL | Status: AC
  Administered 2020-03-09: 650 mg via ORAL
  Filled 2020-03-09: qty 2

## 2020-03-09 NOTE — Discharge Instructions (Signed)
Your work-up here in the ED was overall reassuring.  It does not seem you had a recurrence of the lightheadedness. We recommend follow-up with your primary care provider for any further assessment and management of this issue. Return to the emergency department for persistent dizziness, passing out, chest pain, shortness of breath, abdominal pain, or any other major concerns.

## 2020-03-09 NOTE — ED Provider Notes (Signed)
[facility] Provider Note   CSN: [id number] Arrival date & time: 03/09/20  1330     History Chief Complaint  Patient presents with  . Dizziness    Melody Mitchell is a 79 y.o. female.  HPI      Melody Mitchell is a 79 y.o. female, with a history of arthritis, AVM of the colon, HTN, iron deficiency anemia, hypercholesterolemia, presenting to the ED with lightheadedness intermittent since around 11 AM this morning. Patient states she was preparing to make some food at home, became lightheaded, called her neighbor, who accompanied her to a medical office.   While outside the medical office, patient states she was lightheaded and fell, landing on her left knee.  She complains of pain to the left knee and distal thigh, moderate, nonradiating from this location. She states she typically walks with a cane due to arthritis in the right knee.  She was using her cane at the time of the fall. She does not feel lightheaded or dizzy at the time of my interview. She last took her meclizine around 8 AM this morning.  She states she ate a small amount of food at that time.  She typically would have eaten more but did not do so this morning.  Denies fever/chills, recent illness, N/V/D, chest pain, shortness of breath, cough, abdominal pain, urinary symptoms, weakness, numbness, neck/back pain, or any other complaints.   Past Medical History:  Diagnosis Date  . Acid reflux   . Arthritis   . 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 Bronchitis january 2014  . Hiatal hernia   . High cholesterol   . History of contact dermatitis   . Hx: recurrent pneumonia   . Hypertension   . IDA (iron deficiency anemia)    chronic iron infusions  . Reflux   . Vertigo     Patient Active Problem List   Diagnosis Date Noted  . Dark stools 12/18/2019  . GNR UTI (urinary tract infection) 09/06/2019  . 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 Abnormal CT of the  abdomen 02/02/2017  . Rectal bleeding 02/02/2017  . Aortic atherosclerosis ([place]) 01/03/2017  . Colitis 01/02/2017  . Hypokalemia 01/02/2017  . Heme positive stool 06/10/2015  . Change in bowel habits 06/10/2015  . Abdominal pain, epigastric 06/10/2015  . High cholesterol   . Acid reflux   . GERD 01/03/2011  . HEMORRHOIDS 04/15/2010  . Iron deficiency anemia due to chronic blood loss 01/12/2010  . 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 ABDOMINAL HYSTERECTOMY    . APPENDECTOMY    . BILATERAL BREAST BIOPSIES  BENIGN    . CHOLECYSTECTOMY    . COLONOSCOPY N/A 06/29/2015   SLF: The left colon is redundant 2. four colon polyps removed. No source  for change in bowel habits identified 3. Rectal bleeding due to large internal hemorrhoids   . COLONOSCOPY W/ BIOPSIES  6 2010   Dr. Oneida Alar: Polypoid appearing lesion of the ascending colon, 3 mm sessile rectal polyp, small internal hemorrhoids. Pathology revealed polypoid mucosa, no adenomatous changes  . Double-balloon enteroscopy, antegrade  April 2011   Dr. 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  [facility] Kitchen ESOPHAGOGASTRODUODENOSCOPY  04/2009   Dr. Oneida Alar: Patent Schatzki ring dilated with advancing the scope, patchy erythema in the antrum, 2 small AVMs in the duodenal bulb, 2 additional AVMs  noted in the second portion the duodenum, mild gastritis on pathology  . ESOPHAGOGASTRODUODENOSCOPY N/A 06/29/2015   SLF: 1. 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 small hiatal hernia 3. Mild non-erosive gastririts- NO obvious source for dyspepsia identified.   Marland Kitchen EYE SURGERY       OB History    Gravida  2   Para  2   Term  2   Preterm      AB      Living  1     SAB      TAB      Ectopic      Multiple      Live Births              Family History  Problem Relation Age of Onset  . Heart failure Mother   . Diabetes  Sister   . Heart attack Father   . Colon cancer Neg Hx   . Gastric cancer Neg Hx   . Esophageal cancer Neg Hx     Social History   Tobacco Use  . Smoking status: Former Smoker    Packs/day: 1.00    Years: 25.00    Pack years: 25.00    Types: Cigarettes    Quit date: 09/14/2007    Years since quitting: 12.4  . Smokeless tobacco: Never Used  Substance Use Topics  . Alcohol use: Yes    Comment: occasional/rare (Brandy every now and then)  . Drug use: No    Home Medications Prior to Admission medications   Medication Sig Start Date End Date Taking? Authorizing Provider  acetaminophen (TYLENOL) 500 MG tablet Take 500 mg by mouth every 6 (six) hours as needed for mild pain or moderate pain.   Yes [provider]  amitriptyline (ELAVIL) 25 MG tablet Take 25 mg by mouth at bedtime as needed for sleep.    Yes [provider]  aspirin EC 81 MG tablet Take 81 mg by mouth in the morning.   Yes [provider]  atorvastatin (LIPITOR) 40 MG tablet Take 1 tablet (40 mg total) by mouth every evening. 09/06/19 09/05/20 Yes Emokpae, Courage, MD  COMBIGAN 0.2-0.5 % ophthalmic solution Place 1 drop into the left eye every 12 (twelve) hours.  10/08/18  Yes [provider]  escitalopram (LEXAPRO) 10 MG tablet Take 10 mg by mouth in the morning.  11/11/19  Yes [provider]  esomeprazole (NEXIUM) 40 MG capsule Take 40 mg by mouth daily before breakfast.     Yes [provider]  ezetimibe (ZETIA) 10 MG tablet Take 10 mg by mouth in the morning.  11/11/19  Yes [provider]  fish oil-omega-3 fatty acids 1000 MG capsule Take 1 g by mouth in the morning.    Yes [provider]  fluticasone (FLONASE) 50 MCG/ACT nasal spray Place 2 sprays into both nostrils daily.  04/11/13  Yes [provider]  meclizine (ANTIVERT) 25 MG tablet Take 1 tablet (25 mg total) by mouth 3 (three) times daily as needed for dizziness. Patient  taking differently: Take 25 mg by mouth daily as needed for dizziness.  09/13/16  Yes Rolland Porter, MD  methylcellulose (ARTIFICIAL TEARS) 1 % ophthalmic solution Place 1 drop into both eyes 2 (two) times daily as needed (dry eyes).   Yes [provider]  potassium chloride (KLOR-CON) 20 MEQ packet Take 20 mEq by mouth 2 (two) times daily.   Yes [provider]  sucralfate (CARAFATE) 1 GM/10ML  suspension Take 10 mLs (1 g total) by mouth 4 (four) times daily -  with meals and at bedtime. 02/17/20  Yes Petrucelli, Samantha R, PA-C  traMADol (ULTRAM) 50 MG tablet Take 50 mg by mouth every 6 (six) hours as needed for moderate pain or severe pain.  12/05/19  Yes [provider]  VITAMIN B12 TR 1000 MCG TBCR Take 1 tablet by mouth in the morning.  11/11/19  Yes [provider]  Vitamin D, Ergocalciferol, (DRISDOL) 1.25 MG (50000 UT) CAPS capsule TAKE 1 CAPSULE BY MOUTH ONCE A WEEK Patient taking differently: Take 50,000 Units by mouth every Wednesday.  11/11/19  Yes Lockamy, Randi L, NP-C    Allergies    Latex and Sulfa antibiotics  Review of Systems   Review of Systems  Constitutional: Negative for chills, diaphoresis and fever.  Respiratory: Negative for cough and shortness of breath.   Cardiovascular: Negative for chest pain and leg swelling.  Gastrointestinal: Negative for abdominal pain, diarrhea, nausea and vomiting.  Genitourinary: Negative for difficulty urinating, dysuria, flank pain, frequency and hematuria.  Musculoskeletal: Negative for back pain and neck pain.  Neurological: Positive for light-headedness. Negative for seizures, syncope, weakness, numbness and headaches.  All other systems reviewed and are negative.   Physical Exam Updated Vital Signs BP 137/89   Pulse 66   Temp 98.5 F (36.9 C) (Oral)   Resp 17   Ht 5\' 4"  (1.626 m)   Wt 69 kg   SpO2 98%   BMI 26.11 kg/m   Physical Exam Vitals and nursing note reviewed.  Constitutional:       General: She is not in acute distress.    Appearance: She is well-developed. She is not diaphoretic.  HENT:     Head: Normocephalic and atraumatic.     Mouth/Throat:     Mouth: Mucous membranes are moist.     Pharynx: Oropharynx is clear.  Eyes:     Conjunctiva/sclera: Conjunctivae normal.  Cardiovascular:     Rate and Rhythm: Normal rate and regular rhythm.     Pulses: Normal pulses.          Radial pulses are 2+ on the right side and 2+ on the left side.       Posterior tibial pulses are 2+ on the right side and 2+ on the left side.     Heart sounds: Normal heart sounds.     Comments: Tactile temperature in the extremities appropriate and equal bilaterally. Pulmonary:     Effort: Pulmonary effort is normal. No respiratory distress.     Breath sounds: Normal breath sounds.  Abdominal:     Palpations: Abdomen is soft.     Tenderness: There is no abdominal tenderness. There is no guarding.  Musculoskeletal:     Cervical back: Neck supple.     Right lower leg: No edema.     Left lower leg: No edema.     Comments: Tenderness to the left lateral knee as well as the distal left thigh.  No noted swelling, deformity, instability, color change.  Full range of motion in the left knee, but painful.  No pain, tenderness, swelling, deformity, color change, or instability in the rest of the left lower extremity or in the right lower extremity.  No pain with range of motion of the left hip or ankle.  No pain with range of motion of the joints of the right lower extremity or the bilateral upper extremities.  Normal motor function intact in all  extremities. No midline spinal tenderness.   Overall trauma exam performed without any abnormalities noted other than those mentioned.   Lymphadenopathy:     Cervical: No cervical adenopathy.  Skin:    General: Skin is warm and dry.  Neurological:     Mental Status: She is alert and oriented to person, place, and time.     Comments: No noted acute  cognitive deficit. Sensation grossly intact to light touch in the extremities.   Grip strengths equal bilaterally.   Strength 5/5 in all extremities.  No gait disturbance.  Coordination intact.  Cranial nerves III-XII grossly intact.  Handles oral secretions without noted difficulty.  No noted phonation or speech deficit. No facial droop.   Psychiatric:        Mood and Affect: Mood and affect normal.        Speech: Speech normal.        Behavior: Behavior normal.     ED Results / Procedures / Treatments   Labs (all labs ordered are listed, but only abnormal results are displayed) Labs Reviewed  COMPREHENSIVE METABOLIC PANEL - Abnormal; Notable for the following components:      Result Value   Calcium 8.8 (*)    All other components within normal limits  CBC WITH DIFFERENTIAL/PLATELET - Abnormal; Notable for the following components:   RBC 3.05 (*)    Hemoglobin 9.2 (*)    HCT 30.2 (*)    All other components within normal limits  URINALYSIS, ROUTINE W REFLEX MICROSCOPIC - Abnormal; Notable for the following components:   Leukocytes,Ua MODERATE (*)    All other components within normal limits  CBG MONITORING, ED    Hemoglobin  Date Value Ref Range Status  03/09/2020 9.2 (L) 12.0 - 15.0 g/dL Final  02/17/2020 9.6 (L) 12.0 - 15.0 g/dL Final  01/21/2020 10.5 (L) 12.0 - 15.0 g/dL Final  11/21/2019 9.5 (L) 12.0 - 15.0 g/dL Final    EKG EKG Interpretation  Date/Time:  Monday March 09 2020 13:37:35 EDT Ventricular Rate:  67 PR Interval:    QRS Duration: 91 QT Interval:  441 QTC Calculation: 466 R Axis:   -8 Text Interpretation: Sinus rhythm Low voltage, precordial leads Abnormal R-wave progression, early transition Confirmed by Fredia Sorrow [phone]) on 03/09/2020 1:39:46 PM   Radiology DG Chest 2 View  Result Date: 03/09/2020 CLINICAL DATA:  Fall. Lightheadedness. EXAM: CHEST - 2 VIEW COMPARISON:  One-view chest x-ray 02/17/2020 FINDINGS: Heart size normal.  Atherosclerotic changes are noted at the aortic arch. Lungs are clear. Visualized soft tissues and bony thorax are unremarkable. IMPRESSION: 1. No acute cardiopulmonary disease. 2. Aortic atherosclerosis. Electronically Signed   By: San Morelle M.D.   On: 03/09/2020 16:13   CT Head Wo Contrast  Result Date: 03/09/2020 CLINICAL DATA:  Dizziness EXAM: CT HEAD WITHOUT CONTRAST TECHNIQUE: Contiguous axial images were obtained from the base of the skull through the vertex without intravenous contrast. COMPARISON:  04/23/2014, 09/05/2019 FINDINGS: Brain: No evidence of acute infarction, hemorrhage, hydrocephalus, extra-axial collection or mass lesion/mass effect. Scattered low-density changes within the periventricular and subcortical white matter compatible with chronic microvascular ischemic change. Mild diffuse cerebral volume loss. Vascular: Atherosclerotic calcifications involving the large vessels of the skull base. No unexpected hyperdense vessel. Skull: Normal. Negative for fracture or focal lesion. Sinuses/Orbits: Chronic scattered partial opacification of the left mastoid air cells. Right mastoid air cells and visualized paranasal sinuses are clear. Orbital structures intact. Other: None. IMPRESSION: 1. No acute intracranial findings. 2. Chronic  microvascular ischemic changes and cerebral volume loss. 3. Chronic partial opacification of the left mastoid air cells. Electronically Signed   By: Davina Poke D.O.   On: 03/09/2020 16:10   DG Knee Complete 4 Views Left  Result Date: 03/09/2020 CLINICAL DATA:  Fall, pain EXAM: LEFT KNEE - COMPLETE 4+ VIEW; LEFT FEMUR 2 VIEWS COMPARISON:  None. FINDINGS: There is no acute fracture or dislocation. No joint effusion at the knee. Degenerative changes are present at the knee, greatest in the patellofemoral compartment. Joint space is preserved at the left hip. IMPRESSION: No acute fracture Electronically Signed   By: Macy Mis M.D.   On: 03/09/2020  16:14   DG Femur Min 2 Views Left  Result Date: 03/09/2020 CLINICAL DATA:  Fall, pain EXAM: LEFT KNEE - COMPLETE 4+ VIEW; LEFT FEMUR 2 VIEWS COMPARISON:  None. FINDINGS: There is no acute fracture or dislocation. No joint effusion at the knee. Degenerative changes are present at the knee, greatest in the patellofemoral compartment. Joint space is preserved at the left hip. IMPRESSION: No acute fracture Electronically Signed   By: Macy Mis M.D.   On: 03/09/2020 16:14    Procedures Procedures (including critical care time)  Medications Ordered in ED Medications  sodium chloride 0.9 % bolus 500 mL (0 mLs Intravenous Stopped 03/09/20 1625)  acetaminophen (TYLENOL) tablet 650 mg (650 mg Oral Given 03/09/20 1729)    ED Course  I have reviewed the triage vital signs and the nursing notes.  Pertinent labs & imaging results that were available during my care of the patient were reviewed by me and considered in my medical decision making (see chart for details).  Clinical Course as of Mar 09 2100  Mon Mar 09, 2020  1512 Consistent with previous values.  Hemoglobin(!): 9.2 [SJ]    Clinical Course User Index [SJ] Darra Rosa, Helane Gunther, PA-C   MDM Rules/Calculators/A&P                      Patient presents following episodes of lightheadedness that resolved prior to arrival and did not recur during her ED course. Patient is nontoxic appearing, afebrile, not tachycardic, not tachypneic, not hypotensive, maintains excellent SPO2 on room air, and is in no apparent distress.   I have reviewed the patient's chart to obtain more information.   I reviewed and interpreted the patient's labs and radiological studies. No focal neurologic deficits on exam.  Patient ambulated without need for assistance or onset of any complaints.   The patient was given instructions for home care as well as return precautions. Patient voices understanding of these instructions, accepts the plan, and is comfortable with  discharge.  Findings and plan of care discussed with Gara Kroner, MD. Dr. Laverta Baltimore personally evaluated and examined this patient.   Vitals:   03/09/20 1830 03/09/20 1900 03/09/20 1930 03/09/20 2005  BP: (!) 144/92 (!) 150/91  136/72  Pulse:   66 78  Resp: 19 17  19   Temp:    98.4 F (36.9 C)  TempSrc:    Oral  SpO2:   99% 100%  Weight:      Height:         Final Clinical Impression(s) / ED Diagnoses Final diagnoses:  Lightheadedness    Rx / DC Orders ED Discharge Orders    None       Layla Maw 03/09/20 2103    Margette Fast, MD 03/10/20 [phone]

## 2020-03-09 NOTE — ED Triage Notes (Signed)
Pt was going to PCP for her BP check due to dizziness Pt stumbled over curb and fell. Pt has chronic vertigo per EMS . Pt reports taking meclizine at 0900. Became dizzy approx 11am. Pt reports right hand pain

## 2020-03-09 NOTE — ED Notes (Signed)
Pt ambulated well to bathroom with no assistance.

## 2020-03-11 ENCOUNTER — Other Ambulatory Visit (HOSPITAL_COMMUNITY)
Admission: RE | Admit: 2020-03-11 | Discharge: 2020-03-11 | Disposition: A | Discharge status: Home / Self Care | Payer: Medicare Other | Source: Ambulatory Visit | Attending: Gastroenterology | Admitting: Gastroenterology

## 2020-03-11 ENCOUNTER — Other Ambulatory Visit: Payer: Self-pay

## 2020-03-11 DIAGNOSIS — Z20822 Contact with and (suspected) exposure to covid-19: Secondary | ICD-10-CM | POA: Diagnosis not present

## 2020-03-11 DIAGNOSIS — Z01812 Encounter for preprocedural laboratory examination: Secondary | ICD-10-CM | POA: Diagnosis present

## 2020-03-12 ENCOUNTER — Other Ambulatory Visit (HOSPITAL_COMMUNITY): Payer: Medicare Other

## 2020-03-12 ENCOUNTER — Telehealth: Payer: Self-pay

## 2020-03-12 LAB — SARS CORONAVIRUS 2 (TAT 6-24 HRS): SARS Coronavirus 2: NEGATIVE

## 2020-03-12 NOTE — Telephone Encounter (Signed)
Called pt, EGD for tomorrow moved up to 10:30am. Advised her to arrive at 9:30am. NPO after midnight. Endo scheduler informed.

## 2020-03-12 NOTE — Telephone Encounter (Signed)
Pt returned call. Pt won't be able to make the 9:30 Apt. Pt doesn't have a ride. Please call pt back.

## 2020-03-12 NOTE — Telephone Encounter (Signed)
Called pt, procedure time changed back to 2:15pm. LMOVM for endo scheduler.

## 2020-03-13 ENCOUNTER — Other Ambulatory Visit: Payer: Self-pay

## 2020-03-13 ENCOUNTER — Encounter (HOSPITAL_COMMUNITY): Payer: Self-pay | Admitting: Gastroenterology

## 2020-03-13 ENCOUNTER — Encounter (HOSPITAL_COMMUNITY)
Admission: RE | Disposition: A | Discharge status: Home / Self Care | Payer: Self-pay | Source: Home / Self Care | Attending: Gastroenterology

## 2020-03-13 ENCOUNTER — Ambulatory Visit (HOSPITAL_COMMUNITY)
Admission: RE | Admit: 2020-03-13 | Discharge: 2020-03-13 | Disposition: A | Discharge status: Home / Self Care | Payer: Medicare Other | Attending: Gastroenterology | Admitting: Gastroenterology

## 2020-03-13 DIAGNOSIS — I10 Essential (primary) hypertension: Secondary | ICD-10-CM | POA: Insufficient documentation

## 2020-03-13 DIAGNOSIS — Z79899 Other long term (current) drug therapy: Secondary | ICD-10-CM | POA: Diagnosis not present

## 2020-03-13 DIAGNOSIS — M1990 Unspecified osteoarthritis, unspecified site: Secondary | ICD-10-CM | POA: Insufficient documentation

## 2020-03-13 DIAGNOSIS — Q2733 Arteriovenous malformation of digestive system vessel: Secondary | ICD-10-CM | POA: Insufficient documentation

## 2020-03-13 DIAGNOSIS — E7800 Pure hypercholesterolemia, unspecified: Secondary | ICD-10-CM | POA: Diagnosis not present

## 2020-03-13 DIAGNOSIS — K921 Melena: Secondary | ICD-10-CM | POA: Insufficient documentation

## 2020-03-13 DIAGNOSIS — K219 Gastro-esophageal reflux disease without esophagitis: Secondary | ICD-10-CM | POA: Diagnosis not present

## 2020-03-13 DIAGNOSIS — K31819 Angiodysplasia of stomach and duodenum without bleeding: Secondary | ICD-10-CM | POA: Insufficient documentation

## 2020-03-13 DIAGNOSIS — D500 Iron deficiency anemia secondary to blood loss (chronic): Secondary | ICD-10-CM | POA: Insufficient documentation

## 2020-03-13 DIAGNOSIS — K5521 Angiodysplasia of colon with hemorrhage: Secondary | ICD-10-CM | POA: Insufficient documentation

## 2020-03-13 DIAGNOSIS — K5520 Angiodysplasia of colon without hemorrhage: Secondary | ICD-10-CM

## 2020-03-13 DIAGNOSIS — K449 Diaphragmatic hernia without obstruction or gangrene: Secondary | ICD-10-CM | POA: Insufficient documentation

## 2020-03-13 DIAGNOSIS — K222 Esophageal obstruction: Secondary | ICD-10-CM | POA: Insufficient documentation

## 2020-03-13 HISTORY — PX: ESOPHAGOGASTRODUODENOSCOPY: SHX5428

## 2020-03-13 SURGERY — EGD (ESOPHAGOGASTRODUODENOSCOPY)
Anesthesia: Moderate Sedation

## 2020-03-13 MED ORDER — NALOXONE HCL 0.4 MG/ML IJ SOLN
INTRAMUSCULAR | Status: AC
  Filled 2020-03-13: qty 1

## 2020-03-13 MED ORDER — LIDOCAINE VISCOUS HCL 2 % MT SOLN
OROMUCOSAL | Status: AC
  Filled 2020-03-13: qty 15

## 2020-03-13 MED ORDER — GLUCAGON HCL RDNA (DIAGNOSTIC) 1 MG IJ SOLR
INTRAMUSCULAR | Status: AC
  Filled 2020-03-13: qty 1

## 2020-03-13 MED ORDER — MIDAZOLAM HCL 5 MG/5ML IJ SOLN
INTRAMUSCULAR | Status: DC | PRN
  Administered 2020-03-13 (×2): 2 mg via INTRAVENOUS

## 2020-03-13 MED ORDER — FLUMAZENIL 0.5 MG/5ML IV SOLN
INTRAVENOUS | Status: DC | PRN
  Administered 2020-03-13: .5 mg via INTRAVENOUS

## 2020-03-13 MED ORDER — GLUCAGON HCL (RDNA) 1 MG IJ SOLR
INTRAMUSCULAR | Status: DC | PRN
  Administered 2020-03-13: .5 mg via INTRAVENOUS

## 2020-03-13 MED ORDER — MEPERIDINE HCL 100 MG/ML IJ SOLN
INTRAMUSCULAR | Status: DC | PRN
  Administered 2020-03-13 (×2): 25 mg via INTRAVENOUS

## 2020-03-13 MED ORDER — SODIUM CHLORIDE 0.9 % IV SOLN
INTRAVENOUS | Status: DC
  Administered 2020-03-13: 1000 mL via INTRAVENOUS

## 2020-03-13 MED ORDER — MIDAZOLAM HCL 5 MG/5ML IJ SOLN
INTRAMUSCULAR | Status: DC | PRN
  Administered 2020-03-13: 0.5 mg via INTRAVENOUS

## 2020-03-13 MED ORDER — MEPERIDINE HCL 100 MG/ML IJ SOLN
INTRAMUSCULAR | Status: AC
  Filled 2020-03-13: qty 2

## 2020-03-13 MED ORDER — STERILE WATER FOR IRRIGATION IR SOLN
Status: DC | PRN
  Administered 2020-03-13: 16:00:00 2.5 mL

## 2020-03-13 MED ORDER — SPOT INK MARKER SYRINGE KIT
PACK | SUBMUCOSAL | Status: DC | PRN
  Administered 2020-03-13: 2 mL via SUBMUCOSAL

## 2020-03-13 MED ORDER — NALOXONE HCL 0.4 MG/ML IJ SOLN
INTRAMUSCULAR | Status: DC | PRN
  Administered 2020-03-13: 0.4 mg via INTRAVENOUS

## 2020-03-13 MED ORDER — MIDAZOLAM HCL 5 MG/5ML IJ SOLN
INTRAMUSCULAR | Status: AC
  Filled 2020-03-13: qty 10

## 2020-03-13 MED ORDER — LIDOCAINE VISCOUS HCL 2 % MT SOLN
OROMUCOSAL | Status: DC | PRN
  Administered 2020-03-13: 1 via OROMUCOSAL

## 2020-03-13 MED ORDER — FLUMAZENIL 0.5 MG/5ML IV SOLN
INTRAVENOUS | Status: AC
  Filled 2020-03-13: qty 5

## 2020-03-13 MED ORDER — SPOT INK MARKER SYRINGE KIT
PACK | SUBMUCOSAL | Status: AC
  Filled 2020-03-13: qty 5

## 2020-03-13 NOTE — H&P (Signed)
Primary Care Physician:  [name], MD Primary Gastroenterologist:  Dr. Oneida Alar  Pre-Procedure History & Physical: HPI:  Melody Mitchell is a 79 y.o. female here for Anemia/MELENA/SMALL BOWEL AVMs.  Past Medical History:  Diagnosis Date  . Acid reflux   . Arthritis   . 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 Bronchitis january 2014  . Hiatal hernia   . High cholesterol   . History of contact dermatitis   . Hx: recurrent pneumonia   . Hypertension   . IDA (iron deficiency anemia)    chronic iron infusions  . Reflux   . Vertigo     Past Surgical History:  Procedure Laterality Date  . ABDOMINAL HYSTERECTOMY    . APPENDECTOMY    . BILATERAL BREAST BIOPSIES  BENIGN    . CHOLECYSTECTOMY    . COLONOSCOPY N/A 06/29/2015   SLF: The left colon is redundant 2. four colon polyps removed. No source  for change in bowel habits identified 3. Rectal bleeding due to large internal hemorrhoids   . COLONOSCOPY W/ BIOPSIES  6 2010   Dr. Oneida Alar: Polypoid appearing lesion of the ascending colon, 3 mm sessile rectal polyp, small internal hemorrhoids. Pathology revealed polypoid mucosa, no adenomatous changes  . Double-balloon enteroscopy, antegrade  April 2011   Dr. 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  Marland Kitchen ESOPHAGOGASTRODUODENOSCOPY  04/2009   Dr. Oneida Alar: Patent Schatzki ring dilated with advancing the scope, patchy erythema in the antrum, 2 small AVMs in the duodenal bulb, 2 additional AVMs noted in the second portion the duodenum, mild gastritis on pathology  . ESOPHAGOGASTRODUODENOSCOPY N/A 06/29/2015   SLF: 1. 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 small hiatal hernia 3. Mild non-erosive gastririts- NO obvious source for dyspepsia identified.   Marland Kitchen EYE SURGERY      Prior to Admission medications   Medication Sig Start Date End Date Taking? Authorizing Provider   acetaminophen (TYLENOL) 500 MG tablet Take 500 mg by mouth every 6 (six) hours as needed for mild pain or moderate pain.   Yes [provider]  amitriptyline (ELAVIL) 25 MG tablet Take 25 mg by mouth at bedtime as needed for sleep.    Yes [provider]  aspirin EC 81 MG tablet Take 81 mg by mouth in the morning.   Yes [provider]  atorvastatin (LIPITOR) 40 MG tablet Take 1 tablet (40 mg total) by mouth every evening. 09/06/19 09/05/20 Yes Emokpae, Courage, MD  COMBIGAN 0.2-0.5 % ophthalmic solution Place 1 drop into the left eye every 12 (twelve) hours.  10/08/18  Yes [provider]  escitalopram (LEXAPRO) 10 MG tablet Take 10 mg by mouth in the morning.  11/11/19  Yes [provider]  esomeprazole (NEXIUM) 40 MG capsule Take 40 mg by mouth daily before breakfast.     Yes [provider]  ezetimibe (ZETIA) 10 MG tablet Take 10 mg by mouth in the morning.  11/11/19  Yes [provider]  fish oil-omega-3 fatty acids 1000 MG capsule Take 1 g by mouth in the morning.    Yes [provider]  fluticasone (FLONASE) 50 MCG/ACT nasal spray Place 2 sprays into both nostrils daily.  04/11/13  Yes [provider]  meclizine (ANTIVERT) 25 MG tablet Take 1 tablet (25 mg total) by mouth 3 (three) times daily as needed for dizziness. Patient taking differently: Take 25 mg by  mouth daily as needed for dizziness.  09/13/16  Yes Rolland Porter, MD  methylcellulose (ARTIFICIAL TEARS) 1 % ophthalmic solution Place 1 drop into both eyes 2 (two) times daily as needed (dry eyes).   Yes [provider]  potassium chloride (KLOR-CON) 20 MEQ packet Take 20 mEq by mouth 2 (two) times daily.   Yes [provider]  sucralfate (CARAFATE) 1 GM/10ML suspension Take 10 mLs (1 g total) by mouth 4 (four) times daily -  with meals and at bedtime. 02/17/20  Yes Petrucelli, Samantha R, PA-C  traMADol (ULTRAM) 50 MG tablet Take 50 mg by  mouth every 6 (six) hours as needed for moderate pain or severe pain.  12/05/19  Yes [provider]  VITAMIN B12 TR 1000 MCG TBCR Take 1 tablet by mouth in the morning.  11/11/19  Yes [provider]  Vitamin D, Ergocalciferol, (DRISDOL) 1.25 MG (50000 UT) CAPS capsule TAKE 1 CAPSULE BY MOUTH ONCE A WEEK Patient taking differently: Take 50,000 Units by mouth every Wednesday.  11/11/19  Yes Lockamy, Randi L, NP-C    Allergies as of 12/18/2019 - Review Complete 12/18/2019  Allergen Reaction Noted  . Latex Hives, Itching, and Rash 07/01/2009  . Sulfa antibiotics Hives and Itching 11/21/2013  . Sulfonamide derivatives Hives and Itching     Family History  Problem Relation Age of Onset  . Heart failure Mother   . Diabetes Sister   . Heart attack Father   . Colon cancer Neg Hx   . Gastric cancer Neg Hx   . Esophageal cancer Neg Hx     Social History   Socioeconomic History  . Marital status: Divorced    Spouse name: Not on file  . Number of children: 1  . Years of education: Not on file  . Highest education level: Not on file  Occupational History  . Not on file  Tobacco Use  . Smoking status: Former Smoker    Packs/day: 1.00    Years: 25.00    Pack years: 25.00    Types: Cigarettes    Quit date: 09/14/2007    Years since quitting: 12.5  . Smokeless tobacco: Never Used  Substance and Sexual Activity  . Alcohol use: Not Currently    Comment: occasional/rare Theadora Rama every now and then)  . Drug use: No  . Sexual activity: Yes    Birth control/protection: Post-menopausal, Surgical  Other Topics Concern  . Not on file  Social History Narrative  . Not on file   Social Determinants of Health   Financial Resource Strain:   . Difficulty of Paying Living Expenses:   Food Insecurity:   . Worried About Charity fundraiser in the Last Year:   . Arboriculturist in the Last Year:   Transportation Needs:   . Film/video editor (Medical):   Marland Kitchen Lack of  Transportation (Non-Medical):   Physical Activity:   . Days of Exercise per Week:   . Minutes of Exercise per Session:   Stress:   . Feeling of Stress :   Social Connections:   . Frequency of Communication with Friends and Family:   . Frequency of Social Gatherings with Friends and Family:   . Attends Religious Services:   . Active Member of Clubs or Organizations:   . Attends Archivist Meetings:   Marland Kitchen Marital Status:   Intimate Partner Violence:   . Fear of Current or Ex-Partner:   . Emotionally Abused:   .  Physically Abused:   . Sexually Abused:     Review of Systems: See HPI, otherwise negative ROS   Physical Exam: BP (!) 151/88   Pulse 64   Temp 99.1 F (37.3 C) (Oral)   Resp 14   Ht 5\' 4"  (1.626 m)   SpO2 100%   BMI 26.11 kg/m  General:   Alert,  pleasant and cooperative in NAD Head:  Normocephalic and atraumatic. Neck:  Supple; Lungs:  Clear throughout to auscultation.    Heart:  Regular rate and rhythm. Abdomen:  Soft, nontender and nondistended. Normal bowel sounds, without guarding, and without rebound.   Neurologic:  Alert and  oriented x4;  grossly normal neurologically.  Impression/Plan:     Anemia/MELENA/SMALL BOWEL AVMs  PLAN:  1. ENTEROSCOPY TODAY.  DISCUSSED PROCEDURE, BENEFITS, & RISKS: < 1% chance of medication reaction, bleeding, perforation, or ASPIRATION.

## 2020-03-13 NOTE — Discharge Instructions (Addendum)
THE LOW BLOOD COUNT AND LOW IRON ARE DUE TO RED SPOTS IN THE SMALL BOWEL(AVMs). I APPLIED HEAT TO MAKE SOME OF THEM DISAPPEAR. YOUR OXYGEN LEVEL DROPPED SO WE STOPPED THE PROCEDURE. IF YOU CANNOT KEEP YOUR IRON LEVEL AND BLOOD COUNT, YOU WILL NEED TO GO TO [facility] TO HAVE THEM CAUTERIZE THE REMAINING RED SPOTS. I PUT A CLIP IN YOUR SMALL BOWEL TO PREVENT BLEEDING IN 7-10 DAYS. ALSO I PLACE A TATTOO IN YOUR SMALL BOWEL THAT MARKS THE EXTENT OF YOUR EXAM TODAY.   IF YOU NEED AN MRI, LET THE RADIOLOGY TECH KNOW THAT YOU HAD ONE CLIP PLACED IN YOUR SMALL BOWEL. IT SHOULD FALL OFF IN 30 DAYS.  DRINK WATER TO KEEP YOUR URINE LIGHT YELLOW.  CONTINUE ASPIRIN.  CONTINUE IV IRON.  FOLLOW UP IN 4 MOS.  UPPER ENDOSCOPY AFTER CARE Read the instructions outlined below and refer to this sheet in the next week. These discharge instructions provide you with general information on caring for yourself after you leave the hospital. While your treatment has been planned according to the most current medical practices available, unavoidable complications occasionally occur. If you have any problems or questions after discharge, call DR. FIELDS, [phone].  ACTIVITY  You may resume your regular activity, but move at a slower pace for the next 24 hours.   Take frequent rest periods for the next 24 hours.   Walking will help get rid of the air and reduce the bloated feeling in your belly (abdomen).   No driving for 24 hours (because of the medicine (anesthesia) used during the test).   You may shower.   Do not sign any important legal documents or operate any machinery for 24 hours (because of the anesthesia used during the test).    NUTRITION  Drink plenty of fluids.   You may resume your normal diet as instructed by your doctor.   Begin with a light meal and progress to your normal diet. Heavy or fried foods are harder to digest and may make you feel sick to your stomach (nauseated).   Avoid  alcoholic beverages for 24 hours or as instructed.    MEDICATIONS  You may resume your normal medications.   WHAT YOU CAN EXPECT TODAY  Some feelings of bloating in the abdomen.   Passage of more gas than usual.    IF YOU HAD A BIOPSY TAKEN DURING THE UPPER ENDOSCOPY:  Eat a soft diet IF YOU HAVE NAUSEA, BLOATING, ABDOMINAL PAIN, OR VOMITING.    FINDING OUT THE RESULTS OF YOUR TEST Not all test results are available during your visit. DR. Oneida Alar WILL CALL YOU WITHIN 14 DAYS OF YOUR PROCEDUE WITH YOUR RESULTS. Do not assume everything is normal if you have not heard from DR. FIELDS, CALL HER OFFICE AT [phone].  SEEK IMMEDIATE MEDICAL ATTENTION AND CALL THE OFFICE: [phone] IF:  You have more than a spotting of blood in your stool.   Your belly is swollen (abdominal distention).   You are nauseated or vomiting.   You have a temperature over 101F.   You have abdominal pain or discomfort that is severe or gets worse throughout the day.    Arteriovenous Malformation An arteriovenous malformation (AVM) is a disorder that has been present since birth (congenital). It is characterized by a complex, tangled web of arteries and veins. An AVM may occur in the STOMACH, COLON, OR SMALL BOWEL.  SYMPTOMS  The most common problems (symptoms) of AVM include:  Bleeding (hemorrhaging).  ANEMIA  LOW IRON  TREATMENT  There are three general forms of treatment for AVM: **ABLATION WITH HEAT

## 2020-03-13 NOTE — Op Note (Signed)
[facility] Patient Name: Melody Mitchell Procedure Date: 03/13/2020 3:23 PM MRN: [id number] Date of Birth: 1940/11/21 Attending MD: Barney Drain MD, MD CSN: [id number] Age: 79 Admit Type: Outpatient Procedure:                Upper GI endoscopy WITH APC/SUBMUCOSAL INJECTION,                            CLIPx 1 Indications:              Iron deficiency anemia secondary to chronic blood                            loss, Arteriovenous malformation in the small                            intestine, Melena Providers:                Barney Drain MD, MD, Lurline Del, RN, Bonnetta Barry,                            Technician Referring MD:             [name] Medicines:                Meperidine 50 mg IV, Midazolam 4.5 mg IV,                            Flumazenil 0.5 mg IV, Naloxone 0.4 mg IV Complications:            No immediate complications. PT TRANSIENTLY                            HYPOXIC-O2SAT 50-60%. 100% NRB MASK PLACED.                            ROMAZICON/NARCAN GIVEN. CLIP PLACED IN JEJUNUM.                            SCOPE WITHDRAWN. PT'S O2SAT > 90% AFTER                            INTERVENTIONS. Estimated Blood Loss:     Estimated blood loss was minimal. Procedure:                Pre-Anesthesia Assessment:                           - Prior to the procedure, a History and Physical                            was performed, and patient medications and                            allergies were reviewed. The patient's tolerance of                            previous anesthesia was  also reviewed. The risks                            and benefits of the procedure and the sedation                            options and risks were discussed with the patient.                            All questions were answered, and informed consent                            was obtained. Prior Anticoagulants: The patient has                            taken no previous anticoagulant or antiplatelet                             agents except for aspirin. ASA Grade Assessment:                            III - A patient with severe systemic disease. After                            reviewing the risks and benefits, the patient was                            deemed in satisfactory condition to undergo the                            procedure. After obtaining informed consent, the                            endoscope was passed under direct vision.                            Throughout the procedure, the patient's blood                            pressure, pulse, and oxygen saturations were                            monitored continuously. The [id number]                            was introduced through the mouth, and advanced to                            the. The [id number] scope was introduced                            through the mouth, and advanced to the second part  of duodenum. The upper GI endoscopy was somewhat                            difficult due to the patient's oxygen desaturation.                            Successful completion of the procedure was aided by                            performing chin lift, administering oxygen and                            administering sedation reversal agent. The patient                            tolerated the procedure fairly well. Scope In: 3:56:41 PM Scope Out: 4:18:30 PM Total Procedure Duration: 0 hours 21 minutes 49 seconds  Findings:      A low-grade of narrowing Schatzki ring was found at the gastroesophageal       junction.      A small hiatal hernia was present.      The examined duodenum was normal.      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       Coagulation for bleeding prevention using argon plasma at 0.5       liters/minute and 20 watts was successful AND ONE LESION ACTIVELY OOZED       AND BLEEDING WAS DIFFICULT TO CONTROL. To prevent bleeding        post-maneuver, one hemostatic clip was successfully placed (MR       conditional). There was no bleeding at the end of the procedure.      Area was tattooed with an injection of 1 mL of Spot (carbon black). Impression:               - Low-grade of narrowing Schatzki ring.                           - Small hiatal hernia.                           - Normal examined duodenum.                           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 Treated with argon plasma coagulation                            (APC). Clip (MR conditional) was placed TO PREVENT                            BLEEDING.                           -SPOT TATTOO IN JEJUNUM ~90 CM FROM THE INCISORS. Moderate Sedation:  Moderate (conscious) sedation was administered by the endoscopy nurse       and supervised by the endoscopist. The following parameters were       monitored: oxygen saturation, heart rate, blood pressure, and response       to care. Total physician intraservice time was 38 minutes. Recommendation:           - Patient has a contact number available for                            emergencies. The signs and symptoms of potential                            delayed complications were discussed with the                            patient. Return to normal activities tomorrow.                            Written discharge instructions were provided to the                            patient.                           - Resume previous diet.                           - Continue present medications.                           - Return to GI office in 3 months. REPEAT                            ENTEROSCOPY/APC AT [facility] IF PT DEVELOPS TRANSFUSION                            DEPENDENT ANEMIA OR UNABE TO MAINTAIN FERRITIN. Procedure Code(s):        --- Professional ---                           [phone], Esophagogastroduodenoscopy, flexible,                            transoral;  with control of bleeding, any method                           43236, 59, Esophagogastroduodenoscopy, flexible,                            transoral; with directed submucosal injection(s),                            any substance                           99153, Moderate sedation; each additional 15  minutes intraservice time                           99153, Moderate sedation; each additional 15                            minutes intraservice time                           G0500, Moderate sedation services provided by the                            same physician or other qualified health care                            professional performing a gastrointestinal                            endoscopic service that sedation supports,                            requiring the presence of an independent trained                            observer to assist in the monitoring of the                            patient's level of consciousness and physiological                            status; initial 15 minutes of intra-service time;                            patient age 44 years or older (additional time may                            be reported with [phone], as appropriate) Diagnosis Code(s):        --- Professional ---                           K22.2, Esophageal obstruction                           K44.9, Diaphragmatic hernia without obstruction or                            gangrene                           K55.20, Angiodysplasia of colon without hemorrhage                           D50.0, Iron deficiency anemia secondary to blood                            loss (chronic)  K31.819, Angiodysplasia of stomach and duodenum                            without bleeding                           K92.1, Melena (includes Hematochezia) CPT copyright 2019 American [employer] Association. All rights reserved. The codes documented in this report are  preliminary and upon coder review may  be revised to meet current compliance requirements. Barney Drain, MD Barney Drain MD, MD 03/13/2020 4:47:25 PM This report has been signed electronically. Number of Addenda: 0

## 2020-03-27 ENCOUNTER — Inpatient Hospital Stay (HOSPITAL_COMMUNITY): Payer: Medicare Other | Attending: Hematology

## 2020-03-27 ENCOUNTER — Other Ambulatory Visit: Payer: Self-pay

## 2020-03-27 DIAGNOSIS — E538 Deficiency of other specified B group vitamins: Secondary | ICD-10-CM | POA: Insufficient documentation

## 2020-03-27 DIAGNOSIS — D500 Iron deficiency anemia secondary to blood loss (chronic): Secondary | ICD-10-CM | POA: Diagnosis not present

## 2020-03-27 DIAGNOSIS — Z7982 Long term (current) use of aspirin: Secondary | ICD-10-CM | POA: Diagnosis not present

## 2020-03-27 DIAGNOSIS — Q2733 Arteriovenous malformation of digestive system vessel: Secondary | ICD-10-CM | POA: Insufficient documentation

## 2020-03-27 DIAGNOSIS — K219 Gastro-esophageal reflux disease without esophagitis: Secondary | ICD-10-CM | POA: Diagnosis not present

## 2020-03-27 DIAGNOSIS — E559 Vitamin D deficiency, unspecified: Secondary | ICD-10-CM | POA: Diagnosis not present

## 2020-03-27 DIAGNOSIS — Z79899 Other long term (current) drug therapy: Secondary | ICD-10-CM | POA: Diagnosis not present

## 2020-03-27 DIAGNOSIS — Z87891 Personal history of nicotine dependence: Secondary | ICD-10-CM | POA: Insufficient documentation

## 2020-03-27 DIAGNOSIS — I10 Essential (primary) hypertension: Secondary | ICD-10-CM | POA: Insufficient documentation

## 2020-03-27 DIAGNOSIS — E7800 Pure hypercholesterolemia, unspecified: Secondary | ICD-10-CM | POA: Insufficient documentation

## 2020-03-27 LAB — COMPREHENSIVE METABOLIC PANEL
ALT: 15 U/L (ref 0–44)
AST: 21 U/L (ref 15–41)
Albumin: 3.9 g/dL (ref 3.5–5.0)
Alkaline Phosphatase: 66 U/L (ref 38–126)
Anion gap: 7 (ref 5–15)
BUN: 14 mg/dL (ref 8–23)
CO2: 27 mmol/L (ref 22–32)
Calcium: 9.1 mg/dL (ref 8.9–10.3)
Chloride: 104 mmol/L (ref 98–111)
Creatinine, Ser: 0.88 mg/dL (ref 0.44–1.00)
GFR calc Af Amer: >60 mL/min (ref >60)
GFR calc non Af Amer: >60 mL/min (ref >60)
Glucose, Bld: 64 mg/dL — ABNORMAL LOW (ref 70–99)
Potassium: 4.3 mmol/L (ref 3.5–5.1)
Sodium: 138 mmol/L (ref 135–145)
Total Bilirubin: 0.4 mg/dL (ref 0.3–1.2)
Total Protein: 7.2 g/dL (ref 6.5–8.1)

## 2020-03-27 LAB — CBC WITH DIFFERENTIAL/PLATELET
Abs Immature Granulocytes: 0.01 10*3/uL (ref 0.00–0.07)
Basophils Absolute: 0 10*3/uL (ref 0.0–0.1)
Basophils Relative: 1 %
Eosinophils Absolute: 0.2 10*3/uL (ref 0.0–0.5)
Eosinophils Relative: 4 %
HCT: 33.7 % — ABNORMAL LOW (ref 36.0–46.0)
Hemoglobin: 10.2 g/dL — ABNORMAL LOW (ref 12.0–15.0)
Immature Granulocytes: 0 %
Lymphocytes Relative: 25 %
Lymphs Abs: 1 10*3/uL (ref 0.7–4.0)
MCH: 29.6 pg (ref 26.0–34.0)
MCHC: 30.3 g/dL (ref 30.0–36.0)
MCV: 97.7 fL (ref 80.0–100.0)
Monocytes Absolute: 0.4 10*3/uL (ref 0.1–1.0)
Monocytes Relative: 11 %
Neutro Abs: 2.3 10*3/uL (ref 1.7–7.7)
Neutrophils Relative %: 59 %
Platelets: 341 10*3/uL (ref 150–400)
RBC: 3.45 MIL/uL — ABNORMAL LOW (ref 3.87–5.11)
RDW: 14 % (ref 11.5–15.5)
WBC: 3.9 10*3/uL — ABNORMAL LOW (ref 4.0–10.5)
nRBC: 0 % (ref 0.0–0.2)

## 2020-03-27 LAB — LACTATE DEHYDROGENASE: LDH: 129 U/L (ref 98–192)

## 2020-03-27 LAB — IRON AND TIBC
Iron: 39 ug/dL (ref 28–170)
Saturation Ratios: 15 % (ref 10.4–31.8)
TIBC: 260 ug/dL (ref 250–450)
UIBC: 221 ug/dL

## 2020-03-27 LAB — FOLATE: Folate: 8.4 ng/mL (ref >5.9)

## 2020-03-27 LAB — FERRITIN: Ferritin: 175 ng/mL (ref 11–307)

## 2020-03-27 LAB — VITAMIN B12: Vitamin B-12: 477 pg/mL (ref 180–914)

## 2020-03-27 LAB — VITAMIN D 25 HYDROXY (VIT D DEFICIENCY, FRACTURES): Vit D, 25-Hydroxy: 98.92 ng/mL (ref 30–100)

## 2020-04-03 ENCOUNTER — Inpatient Hospital Stay (HOSPITAL_BASED_OUTPATIENT_CLINIC_OR_DEPARTMENT_OTHER): Payer: Medicare Other | Admitting: Nurse Practitioner

## 2020-04-03 ENCOUNTER — Other Ambulatory Visit: Payer: Self-pay

## 2020-04-03 DIAGNOSIS — D500 Iron deficiency anemia secondary to blood loss (chronic): Secondary | ICD-10-CM | POA: Diagnosis not present

## 2020-04-03 NOTE — Assessment & Plan Note (Signed)
1.  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  She also had a EGD and colonoscopy in 2016 did not show any evidence of active bleeding areas, however she had large internal hemorrhoids. -Last [name] infusion on 12/02/2019 and 12/13/2019. - He denies any bright red bleeding per rectum but does have dark stools every day now. -Labs on 03/27/2020 showed her hemoglobin 10.2, platelets 341, ferritin 175, percent saturation 15. -She reports her fatigue has increased.  We will give her 2 more infusions of IV iron. -Patient followed up with GI and they placed a clip in her bowel.  She will follow up with them in 4 months. -We will see her back in 4 months with repeat labs.  2.  Vitamin D deficiency: - Labs on 03/27/2020 showed her vitamin D level at 98.92 -She will continue taking her vitamin D -I will check her labs at next visit.  3.  Borderline vitamin B12 deficiency: - Labs on 03/27/2020 showed her vitamin B 12 level at 477 - She will continue to take vitamin B 12 daily -We will check her labs at next visit.

## 2020-04-03 NOTE — Progress Notes (Signed)
Melody Mitchell, Melody Mitchell   CLINIC:  Medical Oncology/Hematology  PCP:  [name], MD [address] [place] [address] [phone]   REASON FOR VISIT: Follow-up for iron deficiency anemia   CURRENT THERAPY: Intermittent iron infusions   INTERVAL HISTORY:  Melody Mitchell 79 y.o. female returns for routine follow-up for iron deficiency anemia.  Patient reports she is doing well since her last visit however she does feel more fatigued than normal.  She does occasionally have dark stools but it is not as often since GI placed a clip in her bowel.  She denies any bright red bleeding per rectum. Denies any nausea, vomiting, or diarrhea. Denies any new pains. Had not noticed any recent bleeding such as epistaxis, hematuria or hematochezia. Denies recent chest pain on exertion, shortness of breath on minimal exertion, pre-syncopal episodes, or palpitations. Denies any numbness or tingling in hands or feet. Denies any recent fevers, infections, or recent hospitalizations. Patient reports appetite at 75% and energy level at 75%.  She is eating well maintain her weight at this time.    REVIEW OF SYSTEMS:  Review of Systems  Constitutional: Positive for fatigue.  All other systems reviewed and are negative.    PAST MEDICAL/SURGICAL HISTORY:  Past Medical History:  Diagnosis Date  . Acid reflux   . Arthritis   . 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 Bronchitis january 2014  . Hiatal hernia   . High cholesterol   . History of contact dermatitis   . Hx: recurrent pneumonia   . Hypertension   . IDA (iron deficiency anemia)    chronic iron infusions  . Reflux   . Vertigo    Past Surgical History:  Procedure Laterality Date  . ABDOMINAL HYSTERECTOMY    . APPENDECTOMY    . BILATERAL BREAST BIOPSIES  BENIGN    . CHOLECYSTECTOMY    . COLONOSCOPY N/A  06/29/2015   SLF: The left colon is redundant 2. four colon polyps removed. No source  for change in bowel habits identified 3. Rectal bleeding due to large internal hemorrhoids   . COLONOSCOPY W/ BIOPSIES  6 2010   Dr. Oneida Alar: Polypoid appearing lesion of the ascending colon, 3 mm sessile rectal polyp, small internal hemorrhoids. Pathology revealed polypoid mucosa, no adenomatous changes  . Double-balloon enteroscopy, antegrade  April 2011   Dr. 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  Marland Kitchen ESOPHAGOGASTRODUODENOSCOPY  04/2009   Dr. Oneida Alar: Patent Schatzki ring dilated with advancing the scope, patchy erythema in the antrum, 2 small AVMs in the duodenal bulb, 2 additional AVMs noted in the second portion the duodenum, mild gastritis on pathology  . ESOPHAGOGASTRODUODENOSCOPY N/A 06/29/2015   SLF: 1. 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 small hiatal hernia 3. Mild non-erosive gastririts- NO obvious source for dyspepsia identified.   . ESOPHAGOGASTRODUODENOSCOPY N/A 03/13/2020   Procedure: ESOPHAGOGASTRODUODENOSCOPY (EGD);  Surgeon: Danie Binder, MD;  Location: [facility];  Service: Endoscopy;  Laterality: N/A;  2:15pm - pt can not come any earlier  . EYE SURGERY       SOCIAL HISTORY:  Social History   Socioeconomic History  . Marital status: Divorced    Spouse name: Not on file  . Number of children: 1  . Years of education: Not on file  . Highest education level: Not on file  Occupational History  . Not on file  Tobacco Use  . Smoking status: Former Smoker    Packs/day: 1.00    Years: 25.00    Pack years: 25.00    Types: Cigarettes    Quit date: 09/14/2007    Years since quitting: 12.5  . Smokeless tobacco: Never Used  Substance and Sexual Activity  . Alcohol use: Not Currently    Comment: occasional/rare Theadora Rama every now and then)  . Drug use: No  . Sexual activity: Yes    Birth control/protection: Post-menopausal, Surgical  Other  Topics Concern  . Not on file  Social History Narrative  . Not on file   Social Determinants of Health   Financial Resource Strain:   . Difficulty of Paying Living Expenses:   Food Insecurity:   . Worried About Charity fundraiser in the Last Year:   . Arboriculturist in the Last Year:   Transportation Needs:   . Film/video editor (Medical):   Marland Kitchen Lack of Transportation (Non-Medical):   Physical Activity:   . Days of Exercise per Week:   . Minutes of Exercise per Session:   Stress:   . Feeling of Stress :   Social Connections:   . Frequency of Communication with Friends and Family:   . Frequency of Social Gatherings with Friends and Family:   . Attends Religious Services:   . Active Member of Clubs or Organizations:   . Attends Archivist Meetings:   Marland Kitchen Marital Status:   Intimate Partner Violence:   . Fear of Current or Ex-Partner:   . Emotionally Abused:   Marland Kitchen Physically Abused:   . Sexually Abused:     FAMILY HISTORY:  Family History  Problem Relation Age of Onset  . Heart failure Mother   . Diabetes Sister   . Heart attack Father   . Colon cancer Neg Hx   . Gastric cancer Neg Hx   . Esophageal cancer Neg Hx     CURRENT MEDICATIONS:  Outpatient Encounter Medications as of 04/03/2020  Medication Sig  . aspirin EC 325 MG tablet Take 325 mg by mouth daily.  Marland Kitchen atorvastatin (LIPITOR) 40 MG tablet Take 1 tablet (40 mg total) by mouth every evening.  . COMBIGAN 0.2-0.5 % ophthalmic solution Place 1 drop into the left eye every 12 (twelve) hours.   Marland Kitchen escitalopram (LEXAPRO) 10 MG tablet Take 10 mg by mouth in the morning.   Marland Kitchen esomeprazole (NEXIUM) 40 MG capsule Take 40 mg by mouth daily before breakfast.    . ezetimibe (ZETIA) 10 MG tablet Take 10 mg by mouth in the morning.   . fish oil-omega-3 fatty acids 1000 MG capsule Take 1 g by mouth in the morning.   . fluticasone (FLONASE) 50 MCG/ACT nasal spray Place 2 sprays into both nostrils daily.   . potassium  chloride SA (KLOR-CON) 20 MEQ tablet Take 20 mEq by mouth 2 (two) times daily.  Marland Kitchen VITAMIN B12 TR 1000 MCG TBCR Take 1 tablet by mouth in the morning.   . Vitamin D, Ergocalciferol, (DRISDOL) 1.25 MG (50000 UT) CAPS capsule TAKE 1 CAPSULE BY MOUTH ONCE A WEEK (Patient taking differently: Take 50,000 Units by mouth every Wednesday. )  . acetaminophen (TYLENOL) 500 MG tablet Take 500 mg by mouth every 6 (six) hours as needed for mild pain or moderate pain.  Marland Kitchen amitriptyline (ELAVIL) 25 MG tablet Take 25 mg by mouth at bedtime as needed for sleep.   Marland Kitchen  meclizine (ANTIVERT) 25 MG tablet Take 1 tablet (25 mg total) by mouth 3 (three) times daily as needed for dizziness. (Patient not taking: Reported on 04/03/2020)  . methylcellulose (ARTIFICIAL TEARS) 1 % ophthalmic solution Place 1 drop into both eyes 2 (two) times daily as needed (dry eyes).  . traMADol (ULTRAM) 50 MG tablet Take 50 mg by mouth every 6 (six) hours as needed for moderate pain or severe pain.   . [DISCONTINUED] aspirin EC 81 MG tablet Take 81 mg by mouth in the morning.  . [DISCONTINUED] potassium chloride (KLOR-CON) 20 MEQ packet Take 20 mEq by mouth 2 (two) times daily.   No facility-administered encounter medications on file as of 04/03/2020.    ALLERGIES:  Allergies  Allergen Reactions  . Latex Hives, Itching and Rash  . Sulfa Antibiotics Hives and Itching     PHYSICAL EXAM:  ECOG Performance status: 1  Vitals:   04/03/20 0956  BP: 120/66  Pulse: 60  Resp: 18  Temp: (!) 97.1 F (36.2 C)  SpO2: 100%   Filed Weights   04/03/20 0956  Weight: 145 lb (65.8 kg)   Physical Exam Constitutional:      Appearance: Normal appearance. She is normal weight.  Cardiovascular:     Rate and Rhythm: Normal rate and regular rhythm.     Heart sounds: Normal heart sounds.  Pulmonary:     Effort: Pulmonary effort is normal.     Breath sounds: Normal breath sounds.  Abdominal:     General: Bowel sounds are normal.     Palpations:  Abdomen is soft.  Musculoskeletal:        General: Normal range of motion.  Skin:    General: Skin is warm.  Neurological:     Mental Status: She is alert and oriented to person, place, and time. Mental status is at baseline.  Psychiatric:        Mood and Affect: Mood normal.        Behavior: Behavior normal.        Thought Content: Thought content normal.        Judgment: Judgment normal.      LABORATORY DATA:  I have reviewed the labs as listed.  CBC    Component Value Date/Time   WBC 3.9 (L) 03/27/2020 0959   RBC 3.45 (L) 03/27/2020 0959   HGB 10.2 (L) 03/27/2020 0959   HCT 33.7 (L) 03/27/2020 0959   PLT 341 03/27/2020 0959   MCV 97.7 03/27/2020 0959   MCH 29.6 03/27/2020 0959   MCHC 30.3 03/27/2020 0959   RDW 14.0 03/27/2020 0959   LYMPHSABS 1.0 03/27/2020 0959   MONOABS 0.4 03/27/2020 0959   EOSABS 0.2 03/27/2020 0959   BASOSABS 0.0 03/27/2020 0959   CMP Latest Ref Rng & Units 03/27/2020 03/09/2020 02/17/2020  Glucose 70 - 99 mg/dL 64(L) 80 90  BUN 8 - 23 mg/dL 14 11 12   Creatinine 0.44 - 1.00 mg/dL 0.88 0.83 0.99  Sodium 135 - 145 mmol/L 138 140 141  Potassium 3.5 - 5.1 mmol/L 4.3 4.0 4.4  Chloride 98 - 111 mmol/L 104 105 108  CO2 22 - 32 mmol/L 27 27 26   Calcium 8.9 - 10.3 mg/dL 9.1 8.8(L) 8.7(L)  Total Protein 6.5 - 8.1 g/dL 7.2 6.8 6.6  Total Bilirubin 0.3 - 1.2 mg/dL 0.4 0.4 0.3  Alkaline Phos 38 - 126 U/L 66 66 62  AST 15 - 41 U/L 21 21 21   ALT 0 - 44 U/L 15 16  16    All questions were answered to patient's stated satisfaction. Encouraged patient to call with any new concerns or questions before his next visit to the cancer center and we can certain see him sooner, if needed.     ASSESSMENT & PLAN:  Iron deficiency anemia due to chronic blood loss 1.  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  She also had a EGD and colonoscopy in 2016 did not show any evidence of active bleeding areas, however she had large  internal hemorrhoids. -Last [name] infusion on 12/02/2019 and 12/13/2019. - He denies any bright red bleeding per rectum but does have dark stools every day now. -Labs on 03/27/2020 showed her hemoglobin 10.2, platelets 341, ferritin 175, percent saturation 15. -She reports her fatigue has increased.  We will give her 2 more infusions of IV iron. -Patient followed up with GI and they placed a clip in her bowel.  She will follow up with them in 4 months. -We will see her back in 4 months with repeat labs.  2.  Vitamin D deficiency: - Labs on 03/27/2020 showed her vitamin D level at 98.92 -She will continue taking her vitamin D -I will check her labs at next visit.  3.  Borderline vitamin B12 deficiency: - Labs on 03/27/2020 showed her vitamin B 12 level at 477 - She will continue to take vitamin B 12 daily -We will check her labs at next visit.     Orders placed this encounter:  Orders Placed This Encounter  Procedures  . Lactate dehydrogenase  . CBC with Differential/Platelet  . Comprehensive metabolic panel  . Ferritin  . Iron and TIBC  . Vitamin B12  . VITAMIN D 25 Hydroxy (Vit-D Deficiency, Fractures)      Melody Finders, FNP-C [place] [phone]

## 2020-04-06 ENCOUNTER — Inpatient Hospital Stay (HOSPITAL_COMMUNITY): Payer: Medicare Other

## 2020-04-06 ENCOUNTER — Other Ambulatory Visit: Payer: Self-pay

## 2020-04-06 VITALS — BP 115/71 | HR 59 | Temp 97.5°F | Resp 18

## 2020-04-06 DIAGNOSIS — D500 Iron deficiency anemia secondary to blood loss (chronic): Secondary | ICD-10-CM

## 2020-04-06 MED ORDER — SODIUM CHLORIDE 0.9 % IV SOLN: INTRAVENOUS | Status: DC

## 2020-04-06 MED ORDER — SODIUM CHLORIDE 0.9 % IV SOLN
510.0000 mg | Freq: Once | INTRAVENOUS | Status: AC
  Administered 2020-04-06: 510 mg via INTRAVENOUS
  Filled 2020-04-06: qty 510

## 2020-04-06 NOTE — Patient Instructions (Signed)
[place] at [facility]  Discharge Instructions:  IV iron infusion received today. Follow up next week as scheduled.  Ferumoxytol injection What is this medicine? FERUMOXYTOL is an iron complex. Iron is used to make healthy red blood cells, which carry oxygen and nutrients throughout the body. This medicine is used to treat iron deficiency anemia. This medicine may be used for other purposes; ask your health care provider or pharmacist if you have questions. COMMON BRAND NAME(S): [name] What should I tell my health care provider before I take this medicine? They need to know if you have any of these conditions:  anemia not caused by low iron levels  high levels of iron in the blood  magnetic resonance imaging (MRI) test scheduled  an unusual or allergic reaction to iron, other medicines, foods, dyes, or preservatives  pregnant or trying to get pregnant  breast-feeding How should I use this medicine? This medicine is for injection into a vein. It is given by a health care professional in a hospital or clinic setting. Talk to your pediatrician regarding the use of this medicine in children. Special care may be needed. Overdosage: If you think you have taken too much of this medicine contact a poison control center or emergency room at once. NOTE: This medicine is only for you. Do not share this medicine with others. What if I miss a dose? It is important not to miss your dose. Call your doctor or health care professional if you are unable to keep an appointment. What may interact with this medicine? This medicine may interact with the following medications:  other iron products This list may not describe all possible interactions. Give your health care provider a list of all the medicines, herbs, non-prescription drugs, or dietary supplements you use. Also tell them if you smoke, drink alcohol, or use illegal drugs. Some items may interact with your  medicine. What should I watch for while using this medicine? Visit your doctor or healthcare professional regularly. Tell your doctor or healthcare professional if your symptoms do not start to get better or if they get worse. You may need blood work done while you are taking this medicine. You may need to follow a special diet. Talk to your doctor. Foods that contain iron include: whole grains/cereals, dried fruits, beans, or peas, leafy green vegetables, and organ meats (liver, kidney). What side effects may I notice from receiving this medicine? Side effects that you should report to your doctor or health care professional as soon as possible:  allergic reactions like skin rash, itching or hives, swelling of the face, lips, or tongue  breathing problems  changes in blood pressure  feeling faint or lightheaded, falls  fever or chills  flushing, sweating, or hot feelings  swelling of the ankles or feet Side effects that usually do not require medical attention (report to your doctor or health care professional if they continue or are bothersome):  diarrhea  headache  nausea, vomiting  stomach pain This list may not describe all possible side effects. Call your doctor for medical advice about side effects. You may report side effects to FDA at 1-800-FDA-1088. Where should I keep my medicine? This drug is given in a hospital or clinic and will not be stored at home. NOTE: This sheet is a summary. It may not cover all possible information. If you have questions about this medicine, talk to your doctor, pharmacist, or health care provider.  2020 Elsevier/Gold Standard (2016-12-19 20:21:10)  _______________________________________________________________  Thank you for choosing [place] at [facility] to provide your oncology and hematology care.  To afford each patient quality time with our providers, please arrive at least 15 minutes before your scheduled  appointment.  You need to re-schedule your appointment if you arrive 10 or more minutes late.  We strive to give you quality time with our providers, and arriving late affects you and other patients whose appointments are after yours.  Also, if you no show three or more times for appointments you may be dismissed from the clinic.  Again, thank you for choosing [place] at [place] hope is that these requests will allow you access to exceptional care and in a timely manner. _______________________________________________________________  If you have questions after your visit, please contact our office at [phone] between the hours of 8:30 a.m. and 5:00 p.m. Voicemails left after 4:30 p.m. will not be returned until the following business day. _______________________________________________________________  For prescription refill requests, have your pharmacy contact our office. _______________________________________________________________  Recommendations made by the consultant and any test results will be sent to your referring physician. _______________________________________________________________

## 2020-04-06 NOTE — Progress Notes (Signed)
Melody Mitchell presents today for IV [name] infusion. Pt reports she feels very fatigued and weak, stating that she got dizzy when she was getting out of the car this morning and felt like she may faint. She is also complaining of what looks like a busted blood vessel in her left inner eye. She states she woke up this morning and it was like that. She reports she sees an opthamologist regularly. I recommended that she contact her eye doctor so they can assess her eye closer. She verbalizes understanding. BP and vitals are stable at this time.  Iron infusion tolerated without incident or complaint. VSS upon completion of infusion. IV removed, site clean and dry. Bandaid applied. Discharged via wheelchair in satisfactory condition with follow up instructions.

## 2020-04-16 ENCOUNTER — Inpatient Hospital Stay (HOSPITAL_COMMUNITY): Payer: Medicare Other | Attending: Hematology

## 2020-04-16 ENCOUNTER — Encounter (HOSPITAL_COMMUNITY): Payer: Self-pay

## 2020-04-16 ENCOUNTER — Other Ambulatory Visit: Payer: Self-pay

## 2020-04-16 VITALS — BP 104/63 | HR 65 | Temp 97.2°F | Resp 18

## 2020-04-16 DIAGNOSIS — Q2733 Arteriovenous malformation of digestive system vessel: Secondary | ICD-10-CM | POA: Diagnosis present

## 2020-04-16 DIAGNOSIS — D500 Iron deficiency anemia secondary to blood loss (chronic): Secondary | ICD-10-CM | POA: Diagnosis present

## 2020-04-16 MED ORDER — SODIUM CHLORIDE 0.9 % IV SOLN
510.0000 mg | Freq: Once | INTRAVENOUS | Status: AC
  Administered 2020-04-16: 510 mg via INTRAVENOUS
  Filled 2020-04-16: qty 510

## 2020-04-16 MED ORDER — SODIUM CHLORIDE 0.9 % IV SOLN: INTRAVENOUS | Status: DC

## 2020-04-16 NOTE — Progress Notes (Signed)
Melody Mitchell tolerated [name] infusion well without complaints or incident. Peripheral IV site checked with positive blood return noted prior to and after infusion. VSS Pt discharged self ambulatory using her cane in satisfactory condition

## 2020-04-16 NOTE — Patient Instructions (Signed)
[facility] at [facility] Discharge Instructions  Received [name] infusion today. Follow-up as scheduled   Thank you for choosing [facility] at [facility] to provide your oncology and hematology care.  To afford each patient quality time with our provider, please arrive at least 15 minutes before your scheduled appointment time.   If you have a lab appointment with the Cancer Center please come in thru the Main Entrance and check in at the main information desk.  You need to re-schedule your appointment should you arrive 10 or more minutes late.  We strive to give you quality time with our providers, and arriving late affects you and other patients whose appointments are after yours.  Also, if you no show three or more times for appointments you may be dismissed from the clinic at the providers discretion.     Again, thank you for choosing [facility].  Our hope is that these requests will decrease the amount of time that you wait before being seen by our physicians.       _____________________________________________________________  Should you have questions after your visit to [facility], please contact our office at [phone] between the hours of 8:00 a.m. and 4:30 p.m.  Voicemails left after 4:00 p.m. will not be returned until the following business day.  For prescription refill requests, have your pharmacy contact our office and allow 72 hours.    Due to Covid, you will need to wear a mask upon entering the hospital. If you do not have a mask, a mask will be given to you at the Main Entrance upon arrival. For doctor visits, patients may have 1 support person with them. For treatment visits, patients can not have anyone with them due to social distancing guidelines and our immunocompromised population.     

## 2020-06-17 ENCOUNTER — Other Ambulatory Visit: Payer: Self-pay

## 2020-06-17 ENCOUNTER — Ambulatory Visit (INDEPENDENT_AMBULATORY_CARE_PROVIDER_SITE_OTHER): Payer: Medicare Other | Admitting: Nurse Practitioner

## 2020-06-17 ENCOUNTER — Encounter: Payer: Self-pay | Admitting: Nurse Practitioner

## 2020-06-17 VITALS — BP 145/76 | HR 88 | Temp 97.8°F | Ht 64.0 in | Wt 146.4 lb

## 2020-06-17 DIAGNOSIS — K5520 Angiodysplasia of colon without hemorrhage: Secondary | ICD-10-CM

## 2020-06-17 DIAGNOSIS — D500 Iron deficiency anemia secondary to blood loss (chronic): Secondary | ICD-10-CM

## 2020-06-17 DIAGNOSIS — R195 Other fecal abnormalities: Secondary | ICD-10-CM

## 2020-06-17 NOTE — Progress Notes (Signed)
Referring Provider: [name], MD Primary Care Physician:  [name], MD Primary GI:  Dr. Gala Romney (in the absence of Dr. Oneida Alar); pending Dr. Abbey Chatters  Chief Complaint  Patient presents with  . Anemia    c/o dark stools    HPI:   Melody Mitchell is a 79 y.o. female who presents for follow-up on anemia and black stools.  Patient was last seen in our office 12/18/2019 for IDA and dark stools.  Previous GI bleed.  History of rectal bleeding, GERD, colitis.  Colonoscopy up-to-date 06/29/2015 for polyps removed, rectal bleeding due to large internal hemorrhoids.  EGD with small hiatal hernia, mild nonerosive gastritis, no obvious source for dyspepsia.  Recommended avoid triggers, continue Amitiza, Nexium, Preparation H and next colonoscopy in 10 to 15 years if benefits outweigh the risk (2026 through 2031).  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  She has seen hematology for IDA with daily black stools and increasing fatigue with recommended GI referral.  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  Has received [name] in the past.  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  At her last visit no abdominal pain, intermittent black stools about 20% of the time.  No oral iron or Pepto-Bismol.  No other overt GI complaints.  GERD symptoms well managed on PPI.  No overt weakness or fatigue.  Recommended continue Nexium, EGD for follow-up, follow-up in 6 months in the office.  EGD was completed 03/13/2020 which found low-grade narrowing Schatzki's ring, small Hetal hernia, normal duodenum, multiple nonbleeding angiectasia's in the  jejunum was treated with APC coagulation and MRI conditional clip.  Spot tattoo placed as well.  Recommended repeat enteroscopy/APC at [facility] if develops transfusion dependent anemia or unable to maintain ferritin.  Review of recent labs found ferritin remains normal at 175, but decreased compared to 4 months prior.  At her last visit with hematology on 04/03/2020 noted last [name] infusion 12/02/2019 and 12/13/2019 but notes persistent dark stools every day, increasing fatigue.  Recommended 2 additional infusions of IV iron.  Today she states she is doing okay overall. She is scheduled for iron at some point coming up; has f/u labs and visit with heme/onc 9/21 and 9/28. Denies abdominal pain, N/V, hematochezia. Stools have been dark, not currently on oral iron or Pepto Bismol. About 20% of stools are dark. Denies fever, chills, unintentional weight loss. Denies URI or flu-like symptoms. Denies loss of sense of taste or smell. The patient has received COVID-19 vaccination(s). Denies chest pain, dyspnea, dizziness, lightheadedness, syncope, near syncope. Denies any other upper or lower GI symptoms.  Past Medical History:  Diagnosis Date  . Acid reflux   . Arthritis   . 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 Bronchitis january 2014  . Hiatal hernia   . High cholesterol   . History of contact dermatitis   . Hx: recurrent pneumonia   . Hypertension   . IDA (iron deficiency anemia)    chronic iron infusions  . Reflux   . Vertigo     Past Surgical History:  Procedure Laterality Date  . ABDOMINAL HYSTERECTOMY    . APPENDECTOMY    . BILATERAL BREAST BIOPSIES  BENIGN    . CHOLECYSTECTOMY    . COLONOSCOPY N/A 06/29/2015   SLF: The left colon is redundant 2. four colon polyps removed. No source  for change in bowel habits identified 3. Rectal bleeding due to large internal hemorrhoids   . COLONOSCOPY W/  BIOPSIES  6 2010   Dr. Oneida Alar: Polypoid appearing lesion of the ascending colon, 3 mm sessile rectal polyp, small internal hemorrhoids. Pathology revealed polypoid mucosa, no adenomatous changes  . Double-balloon enteroscopy, antegrade  April 2011   Dr. 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  Marland Kitchen ESOPHAGOGASTRODUODENOSCOPY  04/2009   Dr. Oneida Alar: Patent Schatzki ring dilated with advancing the scope, patchy erythema in the antrum, 2 small AVMs in the duodenal bulb, 2 additional AVMs noted in the second portion the duodenum, mild gastritis on pathology  . ESOPHAGOGASTRODUODENOSCOPY N/A 06/29/2015   SLF: 1. 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 small hiatal hernia 3. Mild non-erosive gastririts- NO obvious source for dyspepsia identified.   . ESOPHAGOGASTRODUODENOSCOPY N/A 03/13/2020   Procedure: ESOPHAGOGASTRODUODENOSCOPY (EGD);  Surgeon: Danie Binder, MD;  Location: [facility];  Service: Endoscopy;  Laterality: N/A;  2:15pm - pt can not come any earlier  . EYE SURGERY      Current Outpatient Medications  Medication Sig Dispense Refill  . acetaminophen (TYLENOL) 500 MG tablet Take 500 mg by mouth every 6 (six) hours as needed for mild pain or moderate pain.    Marland Kitchen amitriptyline (ELAVIL) 25 MG tablet Take 25 mg by mouth at bedtime as needed for sleep.     Marland Kitchen aspirin EC 325 MG tablet Take 325 mg by mouth daily.    Marland Kitchen atorvastatin (LIPITOR) 40 MG tablet Take 1 tablet (40 mg total) by mouth every evening. 30 tablet 4  . COMBIGAN 0.2-0.5 % ophthalmic solution Place 1 drop into the left eye every 12 (twelve) hours.   99  . escitalopram (LEXAPRO) 10 MG tablet Take 10 mg by mouth in the morning.     Marland Kitchen esomeprazole (NEXIUM) 40 MG capsule Take 40 mg by mouth daily before breakfast.      . ezetimibe (ZETIA) 10 MG tablet Take 10 mg by mouth in the morning.     . fish oil-omega-3 fatty acids 1000 MG capsule Take 1 g by mouth in the morning.     . fluticasone (FLONASE)  50 MCG/ACT nasal spray Place 2 sprays into both nostrils daily.     . methylcellulose (ARTIFICIAL TEARS) 1 % ophthalmic solution Place 1 drop into both eyes 2 (two) times daily as needed (dry eyes).    . potassium chloride SA (KLOR-CON) 20 MEQ tablet Take 20 mEq by mouth 2 (two) times daily.    . traMADol (ULTRAM) 50 MG tablet Take 50 mg by mouth every 6 (six) hours as needed for moderate pain or severe pain.     Marland Kitchen VITAMIN B12 TR 1000 MCG TBCR Take 1 tablet by mouth in the morning.     . Vitamin D, Ergocalciferol, (DRISDOL) 1.25 MG (50000 UT) CAPS capsule TAKE 1 CAPSULE BY MOUTH ONCE A WEEK (Patient taking differently: Take 50,000 Units by mouth every Wednesday. ) 4 capsule 11   No current facility-administered medications for this visit.    Allergies as of 06/17/2020 - Review Complete 06/17/2020  Allergen Reaction Noted  . Latex Hives, Itching, and Rash 07/01/2009  .  Sulfa antibiotics Hives and Itching 11/21/2013    Family History  Problem Relation Age of Onset  . Heart failure Mother   . Diabetes Sister   . Heart attack Father   . Colon cancer Neg Hx   . Gastric cancer Neg Hx   . Esophageal cancer Neg Hx     Social History   Socioeconomic History  . Marital status: Divorced    Spouse name: Not on file  . Number of children: 1  . Years of education: Not on file  . Highest education level: Not on file  Occupational History  . Not on file  Tobacco Use  . Smoking status: Former Smoker    Packs/day: 1.00    Years: 25.00    Pack years: 25.00    Types: Cigarettes    Quit date: 09/14/2007    Years since quitting: 12.7  . Smokeless tobacco: Never Used  Vaping Use  . Vaping Use: Never used  Substance and Sexual Activity  . Alcohol use: Not Currently    Comment: occasional/rare Theadora Rama every now and then)  . Drug use: No  . Sexual activity: Yes    Birth control/protection: Post-menopausal, Surgical  Other Topics Concern  . Not on file  Social History Narrative  . Not on  file   Social Determinants of Health   Financial Resource Strain:   . Difficulty of Paying Living Expenses:   Food Insecurity:   . Worried About Charity fundraiser in the Last Year:   . Arboriculturist in the Last Year:   Transportation Needs:   . Film/video editor (Medical):   Marland Kitchen Lack of Transportation (Non-Medical):   Physical Activity:   . Days of Exercise per Week:   . Minutes of Exercise per Session:   Stress:   . Feeling of Stress :   Social Connections:   . Frequency of Communication with Friends and Family:   . Frequency of Social Gatherings with Friends and Family:   . Attends Religious Services:   . Active Member of Clubs or Organizations:   . Attends Archivist Meetings:   Marland Kitchen Marital Status:     Subjective: Review of Systems  Constitutional: Negative for chills, fever, malaise/fatigue and weight loss.  HENT: Negative for congestion and sore throat.   Respiratory: Negative for cough and shortness of breath.   Cardiovascular: Negative for chest pain and palpitations.  Gastrointestinal: Positive for melena ("Dark stools" with history of IDA). Negative for abdominal pain, blood in stool, diarrhea, nausea and vomiting.  Musculoskeletal: Negative for joint pain and myalgias.  Skin: Negative for rash.  Neurological: Negative for dizziness and weakness.  Endo/Heme/Allergies: Does not bruise/bleed easily.  Psychiatric/Behavioral: Negative for depression. The patient is not nervous/anxious.   All other systems reviewed and are negative.    Objective: BP (!) 145/76   Pulse 88   Temp 97.8 F (36.6 C)   Ht 5\' 4"  (1.626 m)   Wt 146 lb 6.4 oz (66.4 kg)   BMI 25.13 kg/m  Physical Exam Vitals and nursing note reviewed.  Constitutional:      General: She is not in acute distress.    Appearance: Normal appearance. She is well-developed and normal weight. She is not ill-appearing, toxic-appearing or diaphoretic.  HENT:     Head: Normocephalic and  atraumatic.     Nose: No congestion or rhinorrhea.  Eyes:     General: No scleral icterus. Cardiovascular:     Rate and Rhythm:  Normal rate and regular rhythm.     Heart sounds: Normal heart sounds.  Pulmonary:     Effort: Pulmonary effort is normal. No respiratory distress.     Breath sounds: Normal breath sounds.  Abdominal:     General: Bowel sounds are normal.     Palpations: Abdomen is soft. There is no hepatomegaly, splenomegaly or mass.     Tenderness: There is no abdominal tenderness. There is no guarding or rebound.     Hernia: No hernia is present.  Skin:    General: Skin is warm and dry.     Coloration: Skin is not jaundiced.     Findings: No rash.  Neurological:     General: No focal deficit present.     Mental Status: She is alert and oriented to person, place, and time.  Psychiatric:        Attention and Perception: Attention normal.        Mood and Affect: Mood normal.        Speech: Speech normal.        Behavior: Behavior normal.        Thought Content: Thought content normal.        Cognition and Memory: Cognition and memory normal.       06/17/2020 10:37 AM   Disclaimer: This note was dictated with voice recognition software. Similar sounding words can inadvertently be transcribed and may not be corrected upon review.

## 2020-06-17 NOTE — Assessment & Plan Note (Signed)
History of jejunal AVM status post APC ablation.  Recommendation for any further transfusion dependent anemia would need to be referred to [facility] for further intervention.  Currently she appears stable based on most recent hematology/oncology visit.  We will keep a close eye on labs with heme-onc and make further recommendations as needed.  Continue other current medications, follow-up with heme-onc as recommended and follow-up in our office in 3 months.

## 2020-06-17 NOTE — Assessment & Plan Note (Signed)
Dark stools and history of IDA found likely to be jejunal AVMs status post APC ablation as per HPI.  Still having dark stools but only about 20% of her stools are dark.  This is reassuring.  Recent follow-up with hematology noted stability of her ferritin and anemia.  Further recommendations to follow her follow-up visit there in about a month.  We will continue to follow along as well.  If she develops recurrent transfusion dependent anemia she will need a referral to [facility] based on recommendations made after her most recent EGD.  Follow-up in our office in 3 months.

## 2020-06-17 NOTE — Assessment & Plan Note (Signed)
IDA followed by heme-onc currently stable with regular follow-ups with them and IV iron as needed.  Her next visit is in about 1 to 1-1/2 months.  Recommend she keep this visit for updated labs, office visit, and iron if needed.  Further recommendations to follow.  Follow-up in 3 months.  Call for any worsening symptoms or obvious worsening bleeding.

## 2020-06-17 NOTE — Patient Instructions (Signed)
Your health issues we discussed today were:   Anemia with history of dark stools: 1. Continue taking your current medications 2. Keep your follow-up appointments for labs and office visit with hematology/oncology 3. As we discussed, if you again get severe anemia that requires a blood transfusion you will likely need to be referred to [facility] in [place], [place]. 4. We will cross that bridge when we get to it, if we get to it 5. Call us for any worsening symptoms or worsening stools 6. Call us with any worsening symptoms of anemia such as worsening dizziness, lightheadedness, fatigue, weakness, shortness of breath, etc.  You should also let hematology know if you develop new symptoms as they may want to evaluate you for iron  Overall I recommend:  1. Continue your other current medications 2. Return for follow-up in 3 months 3. Call us if you have any questions or concerns   ---------------------------------------------------------------  I am glad you have gotten your COVID-19 vaccination!  Even though you are fully vaccinated you should continue to follow CDC and state/local guidelines.  ---------------------------------------------------------------   At [facility] we value your feedback. You may receive a survey about your visit today. Please share your experience as we strive to create trusting relationships with our patients to provide genuine, compassionate, quality care.  We appreciate your understanding and patience as we review any laboratory studies, imaging, and other diagnostic tests that are ordered as we care for you. Our office policy is 5 business days for review of these results, and any emergent or urgent results are addressed in a timely manner for your best interest. If you do not hear from our office in 1 week, please contact us.   We also encourage the use of MyChart, which contains your medical information for your review  as well. If you are not enrolled in this feature, an access code is on this after visit summary for your convenience. Thank you for allowing [place] to be involved in your care.  It was great to see you today!  I hope you have a great Summer!!

## 2020-06-18 NOTE — Progress Notes (Signed)
CC'ED TO PCP 

## 2020-08-04 ENCOUNTER — Other Ambulatory Visit: Payer: Self-pay

## 2020-08-04 ENCOUNTER — Inpatient Hospital Stay (HOSPITAL_COMMUNITY): Payer: Medicare Other | Attending: Hematology

## 2020-08-04 DIAGNOSIS — K219 Gastro-esophageal reflux disease without esophagitis: Secondary | ICD-10-CM | POA: Diagnosis not present

## 2020-08-04 DIAGNOSIS — E559 Vitamin D deficiency, unspecified: Secondary | ICD-10-CM | POA: Insufficient documentation

## 2020-08-04 DIAGNOSIS — I10 Essential (primary) hypertension: Secondary | ICD-10-CM | POA: Diagnosis not present

## 2020-08-04 DIAGNOSIS — Z9049 Acquired absence of other specified parts of digestive tract: Secondary | ICD-10-CM | POA: Diagnosis not present

## 2020-08-04 DIAGNOSIS — Z8249 Family history of ischemic heart disease and other diseases of the circulatory system: Secondary | ICD-10-CM | POA: Diagnosis not present

## 2020-08-04 DIAGNOSIS — D500 Iron deficiency anemia secondary to blood loss (chronic): Secondary | ICD-10-CM | POA: Insufficient documentation

## 2020-08-04 DIAGNOSIS — Q2733 Arteriovenous malformation of digestive system vessel: Secondary | ICD-10-CM | POA: Diagnosis not present

## 2020-08-04 DIAGNOSIS — Z90710 Acquired absence of both cervix and uterus: Secondary | ICD-10-CM | POA: Insufficient documentation

## 2020-08-04 DIAGNOSIS — Z833 Family history of diabetes mellitus: Secondary | ICD-10-CM | POA: Diagnosis not present

## 2020-08-04 DIAGNOSIS — E7800 Pure hypercholesterolemia, unspecified: Secondary | ICD-10-CM | POA: Insufficient documentation

## 2020-08-04 DIAGNOSIS — E538 Deficiency of other specified B group vitamins: Secondary | ICD-10-CM | POA: Diagnosis not present

## 2020-08-04 DIAGNOSIS — Z87891 Personal history of nicotine dependence: Secondary | ICD-10-CM | POA: Insufficient documentation

## 2020-08-04 DIAGNOSIS — Z79899 Other long term (current) drug therapy: Secondary | ICD-10-CM | POA: Insufficient documentation

## 2020-08-04 DIAGNOSIS — Z7982 Long term (current) use of aspirin: Secondary | ICD-10-CM | POA: Diagnosis not present

## 2020-08-04 LAB — CBC WITH DIFFERENTIAL/PLATELET
Abs Immature Granulocytes: 0.02 10*3/uL (ref 0.00–0.07)
Basophils Absolute: 0 10*3/uL (ref 0.0–0.1)
Basophils Relative: 0 %
Eosinophils Absolute: 0.3 10*3/uL (ref 0.0–0.5)
Eosinophils Relative: 5 %
HCT: 35.1 % — ABNORMAL LOW (ref 36.0–46.0)
Hemoglobin: 10.7 g/dL — ABNORMAL LOW (ref 12.0–15.0)
Immature Granulocytes: 0 %
Lymphocytes Relative: 15 %
Lymphs Abs: 0.9 10*3/uL (ref 0.7–4.0)
MCH: 29.9 pg (ref 26.0–34.0)
MCHC: 30.5 g/dL (ref 30.0–36.0)
MCV: 98 fL (ref 80.0–100.0)
Monocytes Absolute: 0.6 10*3/uL (ref 0.1–1.0)
Monocytes Relative: 10 %
Neutro Abs: 4.1 10*3/uL (ref 1.7–7.7)
Neutrophils Relative %: 70 %
Platelets: 293 10*3/uL (ref 150–400)
RBC: 3.58 MIL/uL — ABNORMAL LOW (ref 3.87–5.11)
RDW: 14.3 % (ref 11.5–15.5)
WBC: 6 10*3/uL (ref 4.0–10.5)
nRBC: 0 % (ref 0.0–0.2)

## 2020-08-04 LAB — COMPREHENSIVE METABOLIC PANEL
ALT: 42 U/L (ref 0–44)
AST: 35 U/L (ref 15–41)
Albumin: 3.5 g/dL (ref 3.5–5.0)
Alkaline Phosphatase: 85 U/L (ref 38–126)
Anion gap: 8 (ref 5–15)
BUN: 10 mg/dL (ref 8–23)
CO2: 25 mmol/L (ref 22–32)
Calcium: 8.9 mg/dL (ref 8.9–10.3)
Chloride: 106 mmol/L (ref 98–111)
Creatinine, Ser: 0.92 mg/dL (ref 0.44–1.00)
GFR calc Af Amer: >60 mL/min (ref >60)
GFR calc non Af Amer: 58 mL/min — ABNORMAL LOW (ref >60)
Glucose, Bld: 95 mg/dL (ref 70–99)
Potassium: 4.4 mmol/L (ref 3.5–5.1)
Sodium: 139 mmol/L (ref 135–145)
Total Bilirubin: 0.4 mg/dL (ref 0.3–1.2)
Total Protein: 6.9 g/dL (ref 6.5–8.1)

## 2020-08-04 LAB — IRON AND TIBC
Iron: 30 ug/dL (ref 28–170)
Saturation Ratios: 12 % (ref 10.4–31.8)
TIBC: 257 ug/dL (ref 250–450)
UIBC: 227 ug/dL

## 2020-08-04 LAB — FERRITIN: Ferritin: 250 ng/mL (ref 11–307)

## 2020-08-04 LAB — LACTATE DEHYDROGENASE: LDH: 127 U/L (ref 98–192)

## 2020-08-04 LAB — VITAMIN B12: Vitamin B-12: 1294 pg/mL — ABNORMAL HIGH (ref 180–914)

## 2020-08-04 LAB — VITAMIN D 25 HYDROXY (VIT D DEFICIENCY, FRACTURES): Vit D, 25-Hydroxy: 108.38 ng/mL — ABNORMAL HIGH (ref 30–100)

## 2020-08-11 ENCOUNTER — Inpatient Hospital Stay (HOSPITAL_BASED_OUTPATIENT_CLINIC_OR_DEPARTMENT_OTHER): Payer: Medicare Other | Admitting: Nurse Practitioner

## 2020-08-11 ENCOUNTER — Other Ambulatory Visit: Payer: Self-pay

## 2020-08-11 DIAGNOSIS — D500 Iron deficiency anemia secondary to blood loss (chronic): Secondary | ICD-10-CM | POA: Diagnosis not present

## 2020-08-11 NOTE — Progress Notes (Signed)
Melody Mitchell, [place] [address]   CLINIC:  Medical Oncology/Hematology  PCP:  [name], MD [address] [place] [address] [phone]   REASON FOR VISIT: Follow-up for iron deficiency anemia   CURRENT THERAPY: Intermittent iron infusions   INTERVAL HISTORY:  Melody Mitchell 79 y.o. female returns for routine follow-up for iron deficiency anemia.  Patient reports she is doing well since her last visit.  She reports the iron infusions helped with her energy levels.  She denies any bright red bleeding per rectum or melena.  She denies any easy bruising or bleeding. Denies any nausea, vomiting, or diarrhea. Denies any new pains. Had not noticed any recent bleeding such as epistaxis, hematuria or hematochezia. Denies recent chest pain on exertion, shortness of breath on minimal exertion, pre-syncopal episodes, or palpitations. Denies any numbness or tingling in hands or feet. Denies any recent fevers, infections, or recent hospitalizations. Patient reports appetite at 100% and energy level at 75%.  She is eating well maintain her weight at this time.     REVIEW OF SYSTEMS:  Review of Systems  Gastrointestinal: Positive for constipation.  All other systems reviewed and are negative.    PAST MEDICAL/SURGICAL HISTORY:  Past Medical History:  Diagnosis Date  . Acid reflux   . Arthritis   . 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 Bronchitis january 2014  . Hiatal hernia   . High cholesterol   . History of contact dermatitis   . Hx: recurrent pneumonia   . Hypertension   . IDA (iron deficiency anemia)    chronic iron infusions  . Reflux   . Vertigo    Past Surgical History:  Procedure Laterality Date  . ABDOMINAL HYSTERECTOMY    . APPENDECTOMY    . BILATERAL BREAST BIOPSIES  BENIGN    . CHOLECYSTECTOMY    . COLONOSCOPY N/A 06/29/2015   SLF: The  left colon is redundant 2. four colon polyps removed. No source  for change in bowel habits identified 3. Rectal bleeding due to large internal hemorrhoids   . COLONOSCOPY W/ BIOPSIES  6 2010   Dr. Oneida Alar: Polypoid appearing lesion of the ascending colon, 3 mm sessile rectal polyp, small internal hemorrhoids. Pathology revealed polypoid mucosa, no adenomatous changes  . Double-balloon enteroscopy, antegrade  April 2011   Dr. 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  Marland Kitchen ESOPHAGOGASTRODUODENOSCOPY  04/2009   Dr. Oneida Alar: Patent Schatzki ring dilated with advancing the scope, patchy erythema in the antrum, 2 small AVMs in the duodenal bulb, 2 additional AVMs noted in the second portion the duodenum, mild gastritis on pathology  . ESOPHAGOGASTRODUODENOSCOPY N/A 06/29/2015   SLF: 1. 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 small hiatal hernia 3. Mild non-erosive gastririts- NO obvious source for dyspepsia identified.   . ESOPHAGOGASTRODUODENOSCOPY N/A 03/13/2020   Procedure: ESOPHAGOGASTRODUODENOSCOPY (EGD);  Surgeon: Danie Binder, MD;  Location: [facility];  Service: Endoscopy;  Laterality: N/A;  2:15pm - pt can not come any earlier  . EYE SURGERY       SOCIAL HISTORY:  Social History   Socioeconomic History  . Marital status: Divorced    Spouse name: Not on file  . Number of children: 1  . Years of education: Not on file  . Highest education level: Not on file  Occupational History  . Not on file  Tobacco Use  . Smoking status: Former Smoker    Packs/day: 1.00    Years: 25.00    Pack years: 25.00    Types: Cigarettes    Quit date: 09/14/2007    Years since quitting: 12.9  . Smokeless tobacco: Never Used  Vaping Use  . Vaping Use: Never used  Substance and Sexual Activity  . Alcohol use: Not Currently    Comment: occasional/rare Theadora Rama every now and then)  . Drug use: No  . Sexual activity: Yes    Birth control/protection: Post-menopausal,  Surgical  Other Topics Concern  . Not on file  Social History Narrative  . Not on file   Social Determinants of Health   Financial Resource Strain:   . Difficulty of Paying Living Expenses: Not on file  Food Insecurity:   . Worried About Charity fundraiser in the Last Year: Not on file  . Ran Out of Food in the Last Year: Not on file  Transportation Needs:   . Lack of Transportation (Medical): Not on file  . Lack of Transportation (Non-Medical): Not on file  Physical Activity:   . Days of Exercise per Week: Not on file  . Minutes of Exercise per Session: Not on file  Stress:   . Feeling of Stress : Not on file  Social Connections:   . Frequency of Communication with Friends and Family: Not on file  . Frequency of Social Gatherings with Friends and Family: Not on file  . Attends Religious Services: Not on file  . Active Member of Clubs or Organizations: Not on file  . Attends Archivist Meetings: Not on file  . Marital Status: Not on file  Intimate Partner Violence:   . Fear of Current or Ex-Partner: Not on file  . Emotionally Abused: Not on file  . Physically Abused: Not on file  . Sexually Abused: Not on file    FAMILY HISTORY:  Family History  Problem Relation Age of Onset  . Heart failure Mother   . Diabetes Sister   . Heart attack Father   . Colon cancer Neg Hx   . Gastric cancer Neg Hx   . Esophageal cancer Neg Hx     CURRENT MEDICATIONS:  Outpatient Encounter Medications as of 08/11/2020  Medication Sig  . acetaminophen (TYLENOL) 500 MG tablet Take 500 mg by mouth every 6 (six) hours as needed for mild pain or moderate pain.  Marland Kitchen amitriptyline (ELAVIL) 25 MG tablet Take 25 mg by mouth at bedtime as needed for sleep.   Marland Kitchen aspirin EC 325 MG tablet Take 325 mg by mouth daily.  Marland Kitchen atorvastatin (LIPITOR) 40 MG tablet Take 1 tablet (40 mg total) by mouth every evening.  . COMBIGAN 0.2-0.5 % ophthalmic solution Place 1 drop into the left eye every 12 (twelve)  hours.   Marland Kitchen escitalopram (LEXAPRO) 20 MG tablet Take by mouth in the morning.   Marland Kitchen esomeprazole (NEXIUM) 40 MG capsule Take 40 mg by mouth daily before breakfast.    . ezetimibe (ZETIA) 10 MG tablet Take 10 mg by mouth in the morning.   . fish oil-omega-3 fatty acids 1000 MG capsule Take 1 g by mouth in the morning.   . fluticasone (FLONASE) 50 MCG/ACT nasal spray Place 2 sprays into both nostrils daily.   . meclizine (ANTIVERT) 25 MG tablet Take 25 mg by mouth daily as needed.  . methylcellulose (ARTIFICIAL TEARS) 1 % ophthalmic solution Place 1 drop into both eyes 2 (two)  times daily as needed (dry eyes).  . potassium chloride SA (KLOR-CON) 20 MEQ tablet Take 20 mEq by mouth 2 (two) times daily.  . traMADol (ULTRAM) 50 MG tablet Take 50 mg by mouth every 6 (six) hours as needed for moderate pain or severe pain.   Marland Kitchen VITAMIN B12 TR 1000 MCG TBCR Take 1 tablet by mouth in the morning.   . Vitamin D, Ergocalciferol, (DRISDOL) 1.25 MG (50000 UT) CAPS capsule TAKE 1 CAPSULE BY MOUTH ONCE A WEEK (Patient taking differently: Take 50,000 Units by mouth every Wednesday. )  . [DISCONTINUED] LEXAPRO 10 MG tablet SMARTSIG:1 Tablet(s) By Mouth Daily   No facility-administered encounter medications on file as of 08/11/2020.    ALLERGIES:  Allergies  Allergen Reactions  . Latex Hives, Itching and Rash  . Sulfa Antibiotics Hives and Itching     PHYSICAL EXAM:  ECOG Performance status: 1  Vitals:   08/11/20 1048  BP: (!) 150/85  Pulse: 72  Resp: 18  Temp: (!) 96.9 F (36.1 C)  SpO2: 100%   Filed Weights   08/11/20 1048  Weight: 149 lb 4 oz (67.7 kg)   Physical Exam Constitutional:      Appearance: Normal appearance. She is normal weight.  Cardiovascular:     Rate and Rhythm: Normal rate and regular rhythm.     Heart sounds: Normal heart sounds.  Pulmonary:     Effort: Pulmonary effort is normal.     Breath sounds: Normal breath sounds.  Abdominal:     General: Bowel sounds are normal.      Palpations: Abdomen is soft.  Musculoskeletal:        General: Normal range of motion.  Skin:    General: Skin is warm.  Neurological:     Mental Status: She is alert and oriented to person, place, and time. Mental status is at baseline.  Psychiatric:        Mood and Affect: Mood normal.        Behavior: Behavior normal.        Thought Content: Thought content normal.        Judgment: Judgment normal.      LABORATORY DATA:  I have reviewed the labs as listed.  CBC    Component Value Date/Time   WBC 6.0 08/04/2020 1208   RBC 3.58 (L) 08/04/2020 1208   HGB 10.7 (L) 08/04/2020 1208   HCT 35.1 (L) 08/04/2020 1208   PLT 293 08/04/2020 1208   MCV 98.0 08/04/2020 1208   MCH 29.9 08/04/2020 1208   MCHC 30.5 08/04/2020 1208   RDW 14.3 08/04/2020 1208   LYMPHSABS 0.9 08/04/2020 1208   MONOABS 0.6 08/04/2020 1208   EOSABS 0.3 08/04/2020 1208   BASOSABS 0.0 08/04/2020 1208   CMP Latest Ref Rng & Units 08/04/2020 03/27/2020 03/09/2020  Glucose 70 - 99 mg/dL 95 64(L) 80  BUN 8 - 23 mg/dL 10 14 11   Creatinine 0.44 - 1.00 mg/dL 0.92 0.88 0.83  Sodium 135 - 145 mmol/L 139 138 140  Potassium 3.5 - 5.1 mmol/L 4.4 4.3 4.0  Chloride 98 - 111 mmol/L 106 104 105  CO2 22 - 32 mmol/L 25 27 27   Calcium 8.9 - 10.3 mg/dL 8.9 9.1 8.8(L)  Total Protein 6.5 - 8.1 g/dL 6.9 7.2 6.8  Total Bilirubin 0.3 - 1.2 mg/dL 0.4 0.4 0.4  Alkaline Phos 38 - 126 U/L 85 66 66  AST 15 - 41 U/L 35 21 21  ALT 0 - 44 U/L  42 15 16    All questions were answered to patient's stated satisfaction. Encouraged patient to call with any new concerns or questions before his next visit to the cancer center and we can certain see him sooner, if needed.     ASSESSMENT & PLAN:  Iron deficiency anemia due to chronic blood loss 1.  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  She also had a EGD and colonoscopy in 2016 did not show any evidence of active bleeding areas, however she  had large internal hemorrhoids. -Last [name] infusion on 04/06/2020 and 04/16/2020. - He denies any bright red bleeding per rectum but does have dark stools every day now. -Labs on 08/04/2020 showed her hemoglobin 10.7, platelets 293, ferritin 250, percent saturation 12. -She does not need any IV iron at this time. -Patient followed up with GI and they placed a clip in her bowel.   -We will see her back in 4 months with repeat labs.  2.  Vitamin D deficiency: - Labs on 08/04/2020 showed her vitamin D level at 108.38 -She will continue taking her vitamin D -I will check her labs at next visit.  3.  Borderline vitamin B12 deficiency: - Labs on 08/04/2020 showed her vitamin B 12 level at 1294 - She will continue to take vitamin B 12 daily -We will check her labs at next visit.     Orders placed this encounter:  Orders Placed This Encounter  Procedures  . CBC with Differential/Platelet  . Comprehensive metabolic panel  . Ferritin  . Iron and TIBC  . Lactate dehydrogenase  . Vitamin B12  . VITAMIN D 25 Hydroxy (Vit-D Deficiency, Fractures)      Francene Finders, FNP-C [facility] [phone]

## 2020-08-11 NOTE — Assessment & Plan Note (Addendum)
1.  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  She also had a EGD and colonoscopy in 2016 did not show any evidence of active bleeding areas, however she had large internal hemorrhoids. -Last [name] infusion on 04/06/2020 and 04/16/2020. - He denies any bright red bleeding per rectum but does have dark stools every day now. -Labs on 08/04/2020 showed her hemoglobin 10.7, platelets 293, ferritin 250, percent saturation 12. -She does not need any IV iron at this time. -Patient followed up with GI and they placed a clip in her bowel.   -We will see her back in 4 months with repeat labs.  2.  Vitamin D deficiency: - Labs on 08/04/2020 showed her vitamin D level at 108.38 -She will continue taking her vitamin D -I will check her labs at next visit.  3.  Borderline vitamin B12 deficiency: - Labs on 08/04/2020 showed her vitamin B 12 level at 1294 - She will continue to take vitamin B 12 daily -We will check her labs at next visit.

## 2020-09-17 ENCOUNTER — Other Ambulatory Visit: Payer: Self-pay

## 2020-09-17 ENCOUNTER — Encounter: Payer: Self-pay | Admitting: Nurse Practitioner

## 2020-09-17 ENCOUNTER — Ambulatory Visit (INDEPENDENT_AMBULATORY_CARE_PROVIDER_SITE_OTHER): Payer: Medicare Other | Admitting: Nurse Practitioner

## 2020-09-17 VITALS — BP 116/80 | HR 65 | Temp 97.0°F | Ht 64.0 in | Wt 152.2 lb

## 2020-09-17 DIAGNOSIS — R195 Other fecal abnormalities: Secondary | ICD-10-CM | POA: Diagnosis not present

## 2020-09-17 DIAGNOSIS — K219 Gastro-esophageal reflux disease without esophagitis: Secondary | ICD-10-CM

## 2020-09-17 DIAGNOSIS — D500 Iron deficiency anemia secondary to blood loss (chronic): Secondary | ICD-10-CM

## 2020-09-17 NOTE — Progress Notes (Signed)
Referring Provider: [name], MD Primary Care Physician:  [name], MD Primary GI:  Dr. Abbey Chatters  Chief Complaint  Patient presents with  . Anemia    no dark stools now    HPI:   Melody Mitchell is a 79 y.o. female who presents for 67-month follow-up on anemia and black stools. The patient was last seen in our office 06/17/2020 for the same as well as known arteriovenous malformation of the jejunum. Previous history of GI bleed, rectal bleeding, GERD, colitis. Colonoscopy up-to-date 2016 and repeat colonoscopy in 10 to 15 years if benefits outweigh the risk (2026 through 2031). EGD at same, small hiatal hernia, nonerosive gastritis, no obvious source for dyspepsia.  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  Has seen hematology for IDA and daily black stools and increasing fatigue for which she was recommended a GI referral. Deemed likely due to AVMs as noted by EGD in 2011. Noted double-balloon enteroscopy at 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 Spot tattoo was placed. Recommended repeat enteroscopy/APC at [facility] if she develops transfusion dependent anemia or unable to maintain ferritin.  She did have some drop in ferritin in the months following her EGD, although she had not had any [name] since January. She was having dark stools daily and increasing fatigue for which I recommended 2 additional IV infusions.  At her last visit she noted she is scheduled for iron upcoming with follow-up labs and visit with hematology on 921 and 928. Stools have been dark, not currently on  Pepto-Bismol or oral iron. Dark stools about 20% of the time. No other overt GI complaints. Recommended continue current medications, follow-up with hematology, if persistent anemia likely need for referral back to [facility], notify us of any worsening symptoms and subsequent symptoms of anemia were reviewed with the patient. Recommended follow-up in 3 months.  Today she states she doing okay overall. She has not had any dark stools since her last visit with [place]. She has had more IV iron since her last visit, has upcoming appointment with heme/onc. Energy is pretty good, no worsening fatigue or dizziness. Denies dyspnea or syncope/near syncope. Denies abdominal pain, N/V, hematochezia, fever, chills, unintentional weight loss. Denies URI or flu-like symptoms. Denies loss of sense of taste or smell. The patient has received COVID-19 vaccination(s). Scheduled for booster dose next week. Denies chest pain, dyspnea, dizziness, lightheadedness, syncope, near syncope. Denies any other upper or lower GI symptoms.   GERD still doing well on Nexium 40 mg daily.  Past Medical History:  Diagnosis Date  . Acid reflux   . Arthritis   . 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 Bronchitis january 2014  . Hiatal hernia   . High cholesterol   . History of contact dermatitis   . Hx: recurrent pneumonia   . Hypertension   . IDA (iron deficiency anemia)    chronic iron infusions  . Reflux   . Vertigo     Past Surgical History:  Procedure Laterality Date  . ABDOMINAL HYSTERECTOMY    . APPENDECTOMY    .  BILATERAL BREAST BIOPSIES  BENIGN    . CHOLECYSTECTOMY    . COLONOSCOPY N/A 06/29/2015   SLF: The left colon is redundant 2. four colon polyps removed. No source  for change in bowel habits identified 3. Rectal bleeding due to large internal hemorrhoids   . COLONOSCOPY W/ BIOPSIES  6 2010   Dr. Oneida Alar: Polypoid appearing  lesion of the ascending colon, 3 mm sessile rectal polyp, small internal hemorrhoids. Pathology revealed polypoid mucosa, no adenomatous changes  . Double-balloon enteroscopy, antegrade  April 2011   Dr. 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  Marland Kitchen ESOPHAGOGASTRODUODENOSCOPY  04/2009   Dr. Oneida Alar: Patent Schatzki ring dilated with advancing the scope, patchy erythema in the antrum, 2 small AVMs in the duodenal bulb, 2 additional AVMs noted in the second portion the duodenum, mild gastritis on pathology  . ESOPHAGOGASTRODUODENOSCOPY N/A 06/29/2015   SLF: 1. 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 small hiatal hernia 3. Mild non-erosive gastririts- NO obvious source for dyspepsia identified.   . ESOPHAGOGASTRODUODENOSCOPY N/A 03/13/2020   Procedure: ESOPHAGOGASTRODUODENOSCOPY (EGD);  Surgeon: Danie Binder, MD;  Location: [facility];  Service: Endoscopy;  Laterality: N/A;  2:15pm - pt can not come any earlier  . EYE SURGERY      Current Outpatient Medications  Medication Sig Dispense Refill  . acetaminophen (TYLENOL) 500 MG tablet Take 500 mg by mouth every 6 (six) hours as needed for mild pain or moderate pain.    Marland Kitchen amitriptyline (ELAVIL) 25 MG tablet Take 25 mg by mouth at bedtime as needed for sleep.     Marland Kitchen aspirin EC 325 MG tablet Take 325 mg by mouth daily.    Marland Kitchen atorvastatin (LIPITOR) 40 MG tablet Take 1 tablet (40 mg total) by mouth every evening. 30 tablet 4  . COMBIGAN 0.2-0.5 % ophthalmic solution Place 1 drop into the left eye every 12 (twelve) hours.   99  . escitalopram (LEXAPRO) 10 MG tablet Take 1 tablet by mouth daily.    Marland Kitchen esomeprazole (NEXIUM) 40 MG capsule Take 40 mg by mouth daily before breakfast.      . ezetimibe (ZETIA) 10 MG tablet Take 10 mg by mouth in the morning.     . fish oil-omega-3 fatty acids 1000 MG capsule Take 1 g by mouth in the morning.     . fluticasone (FLONASE) 50 MCG/ACT nasal spray Place 2 sprays into both nostrils  daily.     . meclizine (ANTIVERT) 25 MG tablet Take 25 mg by mouth daily as needed.    . methylcellulose (ARTIFICIAL TEARS) 1 % ophthalmic solution Place 1 drop into both eyes 2 (two) times daily as needed (dry eyes).    . potassium chloride SA (KLOR-CON) 20 MEQ tablet Take 20 mEq by mouth 2 (two) times daily.    . traMADol (ULTRAM) 50 MG tablet Take 50 mg by mouth every 6 (six) hours as needed for moderate pain or severe pain.     Marland Kitchen VITAMIN B12 TR 1000 MCG TBCR Take 1 tablet by mouth in the morning.     . Vitamin D, Ergocalciferol, (DRISDOL) 1.25 MG (50000 UT) CAPS capsule TAKE 1 CAPSULE BY MOUTH ONCE A WEEK (Patient taking differently: Take 50,000 Units by mouth every Wednesday. ) 4 capsule 11   No current facility-administered medications for this visit.    Allergies as of 09/17/2020 - Review Complete 09/17/2020  Allergen Reaction Noted  . Latex Hives, Itching, and Rash 07/01/2009  .  Sulfa antibiotics Hives and Itching 11/21/2013    Family History  Problem Relation Age of Onset  . Heart failure Mother   . Diabetes Sister   . Heart attack Father   . Colon cancer Neg Hx   . Gastric cancer Neg Hx   . Esophageal cancer Neg Hx     Social History   Socioeconomic History  . Marital status: Divorced    Spouse name: Not on file  . Number of children: 1  . Years of education: Not on file  . Highest education level: Not on file  Occupational History  . Not on file  Tobacco Use  . Smoking status: Former Smoker    Packs/day: 1.00    Years: 25.00    Pack years: 25.00    Types: Cigarettes    Quit date: 09/14/2007    Years since quitting: 13.0  . Smokeless tobacco: Never Used  Vaping Use  . Vaping Use: Never used  Substance and Sexual Activity  . Alcohol use: Not Currently    Comment: occasional/rare Theadora Rama every now and then)  . Drug use: No  . Sexual activity: Yes    Birth control/protection: Post-menopausal, Surgical  Other Topics Concern  . Not on file  Social History  Narrative  . Not on file   Social Determinants of Health   Financial Resource Strain:   . Difficulty of Paying Living Expenses: Not on file  Food Insecurity:   . Worried About Charity fundraiser in the Last Year: Not on file  . Ran Out of Food in the Last Year: Not on file  Transportation Needs:   . Lack of Transportation (Medical): Not on file  . Lack of Transportation (Non-Medical): Not on file  Physical Activity:   . Days of Exercise per Week: Not on file  . Minutes of Exercise per Session: Not on file  Stress:   . Feeling of Stress : Not on file  Social Connections:   . Frequency of Communication with Friends and Family: Not on file  . Frequency of Social Gatherings with Friends and Family: Not on file  . Attends Religious Services: Not on file  . Active Member of Clubs or Organizations: Not on file  . Attends Archivist Meetings: Not on file  . Marital Status: Not on file    Subjective: Review of Systems  Constitutional: Negative for chills, fever, malaise/fatigue and weight loss.  HENT: Negative for congestion and sore throat.   Respiratory: Negative for cough and shortness of breath.   Cardiovascular: Negative for chest pain and palpitations.  Gastrointestinal: Negative for abdominal pain, blood in stool, constipation, diarrhea, heartburn, melena, nausea and vomiting.  Musculoskeletal: Negative for joint pain and myalgias.  Skin: Negative for rash.  Neurological: Negative for dizziness and weakness.  Endo/Heme/Allergies: Does not bruise/bleed easily.  Psychiatric/Behavioral: Negative for depression. The patient is not nervous/anxious.   All other systems reviewed and are negative.    Objective: BP 116/80   Pulse 65   Temp (!) 97 F (36.1 C) (Temporal)   Ht 5\' 4"  (1.626 m)   Wt 152 lb 3.2 oz (69 kg)   BMI 26.13 kg/m  Physical Exam Vitals and nursing note reviewed.  Constitutional:      General: She is not in acute distress.    Appearance:  Normal appearance. She is well-developed and normal weight. She is not ill-appearing, toxic-appearing or diaphoretic.  HENT:     Head: Normocephalic and atraumatic.  Nose: No congestion or rhinorrhea.  Eyes:     General: No scleral icterus. Cardiovascular:     Rate and Rhythm: Normal rate and regular rhythm.     Heart sounds: Normal heart sounds.  Pulmonary:     Effort: Pulmonary effort is normal. No respiratory distress.     Breath sounds: Normal breath sounds.  Abdominal:     General: Bowel sounds are normal.     Palpations: Abdomen is soft. There is no hepatomegaly, splenomegaly or mass.     Tenderness: There is no abdominal tenderness. There is no guarding or rebound.     Hernia: No hernia is present.  Skin:    General: Skin is warm and dry.     Coloration: Skin is not jaundiced.     Findings: No rash.  Neurological:     General: No focal deficit present.     Mental Status: She is alert and oriented to person, place, and time.  Psychiatric:        Attention and Perception: Attention normal.        Mood and Affect: Mood normal.        Speech: Speech normal.        Behavior: Behavior normal.        Thought Content: Thought content normal.        Cognition and Memory: Cognition and memory normal.      Assessment:  Very pleasant 79 year old female who presents for follow-up on anemia and dark stools.  Previously noted lymphangiectasia status post APC ablation both here and at [facility].  She previously was having dark stools and discussed possible need for referral back to [facility] for jejunal APC ablation and possible surgical intervention depending on progression of her anemia.  She is also followed by hematology who gives her periodic iron infusions as needed.  Anemia: She has received iron infusions with hematology since her last office visit.  Today she is not having any symptoms of anemia states her energy is generally pretty good.   Reviewed labs recently drawn on 08/04/2020 which found improved hemoglobin, normal iron studies and ferritin.  She is not currently having dark stools.  Given her recent labs we do not need to check any today.  I recommended she continue to follow with hematology based on the recommendations.  She is to call us if she has any worsening anemia symptoms or sees any recurrent dark stools.  GERD: Currently doing well on Nexium daily.  Recommend she continue her PPI   Plan: 1. Continue to follow with hematology 2. Call us for any worsening fatigue, dizziness, lightheadedness 3. Call us if you see any further black stools 4. Continue PPI and notify us of any worsening GERD 5. Follow-up in 6 months    Thank you for allowing [place] to participate in the care of [place], DNP, AGNP-C Adult & Gerontological Nurse Practitioner [facility]   09/17/2020 10:29 AM   Disclaimer: This note was dictated with voice recognition software. Similar sounding words can inadvertently be transcribed and may not be corrected upon review.

## 2020-09-17 NOTE — Patient Instructions (Signed)
Your health issues we discussed today were:   GERD (reflux/heartburn): 1. I am glad your reflux is doing well 2. Continue to take Nexium as you have been 3. Call us if you have any worsening or severe symptoms  Anemia (blood loss) with dark stools: 1. Continue to follow with hematology 2. They can give you iron infusions if you need them 3. As we discussed, your labs currently look good 4. Notify us if you have any worsening signs of anemia such as fatigue, weakness, dizziness, passing out, nearly passing out, shortness of breath, etc. 5. Let us know if you see any further black stools 6. If you continue to require frequent iron and [place] continue to have frequent dark stools we may need to refer you back to [facility], but at this point I feel that is necessary  Overall I recommend:  1. Continue your other current medications 2. Return for follow-up in 6 months 3. Call us for any questions or concerns   ---------------------------------------------------------------  I am glad you have gotten your COVID-19 vaccination!  Even though you are fully vaccinated you should continue to follow CDC and state/local guidelines.  ---------------------------------------------------------------   At [facility] we value your feedback. You may receive a survey about your visit today. Please share your experience as we strive to create trusting relationships with our patients to provide genuine, compassionate, quality care.  We appreciate your understanding and patience as we review any laboratory studies, imaging, and other diagnostic tests that are ordered as we care for you. Our office policy is 5 business days for review of these results, and any emergent or urgent results are addressed in a timely manner for your best interest. If you do not hear from our office in 1 week, please contact us.   We also encourage the use of MyChart, which contains your medical information for  your review as well. If you are not enrolled in this feature, an access code is on this after visit summary for your convenience. Thank you for allowing [place] to be involved in your care.  It was great to see you today!  I hope you have a Happy Thanksgiving!!

## 2020-11-20 ENCOUNTER — Emergency Department (HOSPITAL_COMMUNITY): Payer: Medicare Other

## 2020-11-20 ENCOUNTER — Emergency Department (HOSPITAL_COMMUNITY)
Admission: EM | Admit: 2020-11-20 | Discharge: 2020-11-20 | Disposition: A | Discharge status: Home / Self Care | Payer: Medicare Other | Attending: Emergency Medicine | Admitting: Emergency Medicine

## 2020-11-20 DIAGNOSIS — Z20822 Contact with and (suspected) exposure to covid-19: Secondary | ICD-10-CM | POA: Diagnosis not present

## 2020-11-20 DIAGNOSIS — Z87891 Personal history of nicotine dependence: Secondary | ICD-10-CM | POA: Diagnosis not present

## 2020-11-20 DIAGNOSIS — I10 Essential (primary) hypertension: Secondary | ICD-10-CM | POA: Insufficient documentation

## 2020-11-20 DIAGNOSIS — Z91040 Latex allergy status: Secondary | ICD-10-CM | POA: Diagnosis not present

## 2020-11-20 DIAGNOSIS — R55 Syncope and collapse: Secondary | ICD-10-CM | POA: Diagnosis not present

## 2020-11-20 DIAGNOSIS — R42 Dizziness and giddiness: Secondary | ICD-10-CM | POA: Diagnosis present

## 2020-11-20 LAB — COMPREHENSIVE METABOLIC PANEL
ALT: 16 U/L (ref 0–44)
AST: 25 U/L (ref 15–41)
Albumin: 4 g/dL (ref 3.5–5.0)
Alkaline Phosphatase: 72 U/L (ref 38–126)
Anion gap: 8 (ref 5–15)
BUN: 8 mg/dL (ref 8–23)
CO2: 27 mmol/L (ref 22–32)
Calcium: 9.4 mg/dL (ref 8.9–10.3)
Chloride: 107 mmol/L (ref 98–111)
Creatinine, Ser: 0.94 mg/dL (ref 0.44–1.00)
GFR, Estimated: >60 mL/min (ref >60)
Glucose, Bld: 84 mg/dL (ref 70–99)
Potassium: 3.8 mmol/L (ref 3.5–5.1)
Sodium: 142 mmol/L (ref 135–145)
Total Bilirubin: 0.5 mg/dL (ref 0.3–1.2)
Total Protein: 7.5 g/dL (ref 6.5–8.1)

## 2020-11-20 LAB — CBC WITH DIFFERENTIAL/PLATELET
Abs Immature Granulocytes: 0.01 10*3/uL (ref 0.00–0.07)
Basophils Absolute: 0 10*3/uL (ref 0.0–0.1)
Basophils Relative: 1 %
Eosinophils Absolute: 0.1 10*3/uL (ref 0.0–0.5)
Eosinophils Relative: 3 %
HCT: 35.9 % — ABNORMAL LOW (ref 36.0–46.0)
Hemoglobin: 10.7 g/dL — ABNORMAL LOW (ref 12.0–15.0)
Immature Granulocytes: 0 %
Lymphocytes Relative: 21 %
Lymphs Abs: 0.8 10*3/uL (ref 0.7–4.0)
MCH: 28.3 pg (ref 26.0–34.0)
MCHC: 29.8 g/dL — ABNORMAL LOW (ref 30.0–36.0)
MCV: 95 fL (ref 80.0–100.0)
Monocytes Absolute: 0.4 10*3/uL (ref 0.1–1.0)
Monocytes Relative: 10 %
Neutro Abs: 2.4 10*3/uL (ref 1.7–7.7)
Neutrophils Relative %: 65 %
Platelets: 316 10*3/uL (ref 150–400)
RBC: 3.78 MIL/uL — ABNORMAL LOW (ref 3.87–5.11)
RDW: 14.8 % (ref 11.5–15.5)
WBC: 3.6 10*3/uL — ABNORMAL LOW (ref 4.0–10.5)
nRBC: 0 % (ref 0.0–0.2)

## 2020-11-20 LAB — SARS CORONAVIRUS 2 (TAT 6-24 HRS): SARS Coronavirus 2: NEGATIVE

## 2020-11-20 LAB — TROPONIN I (HIGH SENSITIVITY)
Troponin I (High Sensitivity): 5 ng/L (ref <18)
Troponin I (High Sensitivity): 6 ng/L (ref <18)

## 2020-11-20 MED ORDER — SODIUM CHLORIDE 0.9 % IV BOLUS
500.0000 mL | Freq: Once | INTRAVENOUS | Status: AC
  Administered 2020-11-20: 500 mL via INTRAVENOUS

## 2020-11-20 NOTE — Discharge Instructions (Signed)
You were seen in the emergency department today with feeling lightheaded.  Your lab tests here are reassuring and show some mild dehydration which may have caused your symptoms.  Please drink plenty of fluids and continue your home medications.  I am testing you for COVID-19 and your test results will come back in the next 6 to 24 hours on the MyChart app.  You can download this app on your phone.  There is information in the discharge paperwork about how to do this.  He will also be called with a positive test result.   If you develop any new or suddenly worsening symptoms please call 911 or return to the emergency department.

## 2020-11-20 NOTE — ED Notes (Signed)
Patient transported to X-ray 

## 2020-11-20 NOTE — ED Triage Notes (Signed)
Pt. Arrived via EMS with complaints of feeling dizzy and lightheaded when they went to get their cup of coffee this morning.

## 2020-11-20 NOTE — ED Provider Notes (Signed)
Emergency Department Provider Note   I have reviewed the triage vital signs and the nursing notes.   HISTORY  Chief Complaint Dizziness   HPI Melody Mitchell is a 80 y.o. 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  Patient awoke this morning and was feeling well.  She went to the bathroom and was going about her morning activities without symptoms.  She went to make her coffee and suddenly felt very lightheaded like she was about to pass out.  She denies any chest pain, palpitations, shortness of breath.  She did not fall or fully lose consciousness.  She continued to feel poorly and so called 911.  No diaphoresis.  EMS arrived to find the patient with relatively normal vital signs other than mild tachycardia which is since resolved.  She received no treatment in route.  Patient states she is feeling well at this time.  Denies any sudden severe headaches.  Denies unilateral numbness or weakness.  No vision disturbance.  No URI symptoms. No dysuria, hesitancy, or urgency.  She did take her PPI med this AM before symptoms began but no other morning meds because her hadn't eaten yet. She has not noticed black or BRB in her BMs recently.    Past Medical History:  Diagnosis Date  . Acid reflux   . Arthritis   . 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 Bronchitis january 2014  . Hiatal hernia   . High cholesterol   . History of contact dermatitis   . Hx: recurrent pneumonia   . Hypertension   . IDA (iron deficiency anemia)    chronic iron infusions  . Reflux   . Vertigo     Patient Active Problem List   Diagnosis Date Noted  . Dark stools 12/18/2019  . GNR UTI (urinary tract infection) 09/06/2019  . 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 Abnormal CT of the abdomen 02/02/2017  .  Rectal bleeding 02/02/2017  . Aortic atherosclerosis ([place]) 01/03/2017  . Colitis 01/02/2017  . Hypokalemia 01/02/2017  . Heme positive stool 06/10/2015  . Change in bowel habits 06/10/2015  . Abdominal pain, epigastric 06/10/2015  . High cholesterol   . Acid reflux   . GERD 01/03/2011  . HEMORRHOIDS 04/15/2010  . Iron deficiency anemia due to chronic blood loss 01/12/2010  . 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 ABDOMINAL HYSTERECTOMY    . APPENDECTOMY    . BILATERAL BREAST BIOPSIES  BENIGN    . CHOLECYSTECTOMY    . COLONOSCOPY N/A 06/29/2015   SLF: The left colon is redundant 2. four colon polyps removed. No source  for change in bowel habits identified 3. Rectal bleeding due to large internal hemorrhoids   . COLONOSCOPY W/ BIOPSIES  6 2010   Dr. Oneida Alar: Polypoid appearing lesion of the ascending colon, 3 mm sessile rectal polyp, small internal hemorrhoids. Pathology revealed polypoid mucosa, no adenomatous changes  . Double-balloon enteroscopy, antegrade  April 2011   Dr. 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  Marland Kitchen ESOPHAGOGASTRODUODENOSCOPY  04/2009   Dr. Oneida Alar: Patent Schatzki ring dilated with advancing the scope, patchy erythema in the antrum, 2 small AVMs in the duodenal bulb, 2 additional AVMs noted in the second portion the duodenum, mild  gastritis on pathology  . ESOPHAGOGASTRODUODENOSCOPY N/A 06/29/2015   SLF: 1. 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 small hiatal hernia 3. Mild non-erosive gastririts- NO obvious source for dyspepsia identified.   . ESOPHAGOGASTRODUODENOSCOPY N/A 03/13/2020   Procedure: ESOPHAGOGASTRODUODENOSCOPY (EGD);  Surgeon: Danie Binder, MD;  Location: [facility];  Service: Endoscopy;  Laterality: N/A;  2:15pm - pt can not come any earlier  . EYE SURGERY      Allergies Latex and Sulfa antibiotics  Family History  Problem Relation Age of  Onset  . Heart failure Mother   . Diabetes Sister   . Heart attack Father   . Colon cancer Neg Hx   . Gastric cancer Neg Hx   . Esophageal cancer Neg Hx     Social History Social History   Tobacco Use  . Smoking status: Former Smoker    Packs/day: 1.00    Years: 25.00    Pack years: 25.00    Types: Cigarettes    Quit date: 09/14/2007    Years since quitting: 13.2  . Smokeless tobacco: Never Used  Vaping Use  . Vaping Use: Never used  Substance Use Topics  . Alcohol use: Not Currently    Comment: occasional/rare Theadora Rama every now and then)  . Drug use: No    Review of Systems  Constitutional: No fever/chills Eyes: No visual changes. ENT: No sore throat. Cardiovascular: Denies chest pain. Positive near syncope this AM.  Respiratory: Denies shortness of breath. Gastrointestinal: No abdominal pain.  No nausea, no vomiting.  No diarrhea.  No constipation. Genitourinary: Negative for dysuria. Musculoskeletal: Negative for back pain. Skin: Negative for rash. Neurological: Negative for headaches, focal weakness or numbness.  10-point ROS otherwise negative.  ____________________________________________   PHYSICAL EXAM:  VITAL SIGNS: ED Triage Vitals  Enc Vitals Group     BP 11/20/20 0835 (!) 133/101     Pulse Rate 11/20/20 0835 91     Resp 11/20/20 0835 15     Temp 11/20/20 0832 98.4 F (36.9 C)     Temp src --      SpO2 11/20/20 0828 97 %     Weight 11/20/20 0832 150 lb (68 kg)     Height 11/20/20 0832 5\' 4"  (1.626 m)   Constitutional: Alert and oriented. Well appearing and in no acute distress. Eyes: Conjunctivae are normal.  Head: Atraumatic. Nose: No congestion/rhinnorhea. Mouth/Throat: Mucous membranes are moist.  Neck: No stridor.   Cardiovascular: Normal rate, regular rhythm. Good peripheral circulation. Grossly normal heart sounds.   Respiratory: Normal respiratory effort.  No retractions. Lungs CTAB. Gastrointestinal: Soft and nontender. No  distention.  Musculoskeletal: No lower extremity tenderness nor edema. No gross deformities of extremities. Neurologic:  Normal speech and language. No gross focal neurologic deficits are appreciated. 5/5 strength and sensation in the bilateral upper and lower extremities. No facial asymmetry.  Skin:  Skin is warm, dry and intact. No rash noted.   ____________________________________________   LABS (all labs ordered are listed, but only abnormal results are displayed)  Labs Reviewed  CBC WITH DIFFERENTIAL/PLATELET - Abnormal; Notable for the following components:      Result Value   WBC 3.6 (*)    RBC 3.78 (*)    Hemoglobin 10.7 (*)    HCT 35.9 (*)    MCHC 29.8 (*)    All other components within normal limits  SARS CORONAVIRUS 2 (TAT 6-24 HRS)  COMPREHENSIVE METABOLIC PANEL  TROPONIN I (HIGH SENSITIVITY)  TROPONIN I (HIGH  SENSITIVITY)   ____________________________________________  EKG   EKG Interpretation  Date/Time:  Friday November 20 2020 08:53:52 EST Ventricular Rate:  97 PR Interval:    QRS Duration: 91 QT Interval:  352 QTC Calculation: 448 R Axis:   -7 Text Interpretation: Sinus rhythm Low voltage, precordial leads Nonspecific T abnormalities, anterior leads No STEMI Confirmed by Nanda Quinton [id number]) on 11/20/2020 9:13:20 AM       ____________________________________________  RADIOLOGY  DG Chest 2 View  Result Date: 11/20/2020 CLINICAL DATA:  Near syncope.  Dizziness. EXAM: CHEST - 2 VIEW COMPARISON:  March 09, 2020. FINDINGS: Similar cardiomediastinal silhouette with tortuous aorta. Streaky/linear left basilar opacities. No visible pleural effusions or pneumothorax. No acute osseous abnormality. IMPRESSION: Streaky/linear left basilar opacities, favor atelectasis. Electronically Signed   By: Margaretha Sheffield MD   On: 11/20/2020 10:10    ____________________________________________   PROCEDURES  Procedure(s) performed:   Procedures  None   ____________________________________________   INITIAL IMPRESSION / ASSESSMENT AND PLAN / ED COURSE  Pertinent labs & imaging results that were available during my care of the patient were reviewed by me and considered in my medical decision making (see chart for details).   Patient presents to the emergency department for evaluation of lightheadedness which began abruptly this morning without other associated symptoms.  Vital signs here showed some hypertension but no tachycardia, hypoxemia, fever.  Doubt acute stroke or subarachnoid hemorrhage without symptoms or exam findings to suspect this.  Cardiogenic etiology of symptoms is lower on my differential but will obtain EKG along with screening blood work including troponin.  Patient never had chest pain, shortness of breath, palpitations.  Very low suspicion for PE clinically.  She does have history of known AVMs and anemia related to this in the past.  Will obtain CBC here.  Patient does not report black or bright red blood in the bowel movements recently.  No recent changes to medications and so doubt this is related to medication administration issue. She has a printout of her meds along with pictures of the pills on this form and can point out exactly which med she took this AM (Nexium).   10:15 AM  Labs are overall reassuring.  Troponin is within normal limits.  CBC shows a mild leukopenia but no other acute findings.  No significantly worse anemia compared to prior values.  Chest x-ray favoring streaky atelectasis.  Patient's orthostatics were positive but patient feeling improved after IVF. Plan for COVID test with mild leukopenia and streaky atelectasis on chest x-ray in case this represents early disease presentation.  Will trend troponin and reassess.   Troponin is negative x2.  No significant anemia especially in comparison to prior values.  Plan to send COVID testing which patient will follow as an outpatient.  She is feeling improved  after IV fluids.  Suspect dehydration with orthostatic changes noted here.  Patient feeling improved after IV fluids.  Discussed PCP follow-up plan and ED return precautions. ____________________________________________  FINAL CLINICAL IMPRESSION(S) / ED DIAGNOSES  Final diagnoses:  Near syncope    MEDICATIONS GIVEN DURING THIS VISIT:  Medications  sodium chloride 0.9 % bolus 500 mL (0 mLs Intravenous Stopped 11/20/20 1056)    Note:  This document was prepared using Dragon voice recognition software and may include unintentional dictation errors.  Nanda Quinton, MD, [facility] Emergency Medicine    Dravin Lance, [facility], MD 11/23/20 [phone]

## 2020-11-23 ENCOUNTER — Emergency Department (HOSPITAL_COMMUNITY)
Admission: EM | Admit: 2020-11-23 | Discharge: 2020-11-23 | Disposition: A | Discharge status: Home / Self Care | Payer: Medicare Other | Attending: Emergency Medicine | Admitting: Emergency Medicine

## 2020-11-23 ENCOUNTER — Other Ambulatory Visit: Payer: Self-pay

## 2020-11-23 ENCOUNTER — Encounter (HOSPITAL_COMMUNITY): Payer: Self-pay

## 2020-11-23 DIAGNOSIS — I10 Essential (primary) hypertension: Secondary | ICD-10-CM | POA: Insufficient documentation

## 2020-11-23 DIAGNOSIS — K625 Hemorrhage of anus and rectum: Secondary | ICD-10-CM | POA: Diagnosis not present

## 2020-11-23 DIAGNOSIS — Z87891 Personal history of nicotine dependence: Secondary | ICD-10-CM | POA: Diagnosis not present

## 2020-11-23 DIAGNOSIS — Z7982 Long term (current) use of aspirin: Secondary | ICD-10-CM | POA: Insufficient documentation

## 2020-11-23 DIAGNOSIS — Z91040 Latex allergy status: Secondary | ICD-10-CM | POA: Diagnosis not present

## 2020-11-23 DIAGNOSIS — Z79899 Other long term (current) drug therapy: Secondary | ICD-10-CM | POA: Insufficient documentation

## 2020-11-23 LAB — TYPE AND SCREEN
ABO/RH(D): A POS
Antibody Screen: NEGATIVE

## 2020-11-23 LAB — CBC
HCT: 34.1 % — ABNORMAL LOW (ref 36.0–46.0)
Hemoglobin: 10.2 g/dL — ABNORMAL LOW (ref 12.0–15.0)
MCH: 28.3 pg (ref 26.0–34.0)
MCHC: 29.9 g/dL — ABNORMAL LOW (ref 30.0–36.0)
MCV: 94.7 fL (ref 80.0–100.0)
Platelets: 345 10*3/uL (ref 150–400)
RBC: 3.6 MIL/uL — ABNORMAL LOW (ref 3.87–5.11)
RDW: 14.8 % (ref 11.5–15.5)
WBC: 5.3 10*3/uL (ref 4.0–10.5)
nRBC: 0 % (ref 0.0–0.2)

## 2020-11-23 LAB — COMPREHENSIVE METABOLIC PANEL
ALT: 16 U/L (ref 0–44)
AST: 22 U/L (ref 15–41)
Albumin: 4 g/dL (ref 3.5–5.0)
Alkaline Phosphatase: 65 U/L (ref 38–126)
Anion gap: 9 (ref 5–15)
BUN: 9 mg/dL (ref 8–23)
CO2: 25 mmol/L (ref 22–32)
Calcium: 9.3 mg/dL (ref 8.9–10.3)
Chloride: 104 mmol/L (ref 98–111)
Creatinine, Ser: 0.89 mg/dL (ref 0.44–1.00)
GFR, Estimated: >60 mL/min (ref >60)
Glucose, Bld: 67 mg/dL — ABNORMAL LOW (ref 70–99)
Potassium: 4.3 mmol/L (ref 3.5–5.1)
Sodium: 138 mmol/L (ref 135–145)
Total Bilirubin: 0.4 mg/dL (ref 0.3–1.2)
Total Protein: 7.4 g/dL (ref 6.5–8.1)

## 2020-11-23 LAB — POC OCCULT BLOOD, ED: Fecal Occult Bld: NEGATIVE

## 2020-11-23 LAB — CBG MONITORING, ED: Glucose-Capillary: 94 mg/dL (ref 70–99)

## 2020-11-23 NOTE — ED Provider Notes (Signed)
Melody Mitchell   CSN: [id number] Arrival date & time: 11/23/20  0915     History Chief Complaint  Patient presents with  . Rectal Bleeding    Melody Mitchell is a 80 y.o. female with history of know small bowel AVMs and chronic GI bleed who presents with concern for dark tarry stool this morning. Patient with history of persistent iron deficiency anemia requiring multiple iron transfusions in the past; she is following with gastroenterology and hematology, most recently 09/2020.  She endorses sensation of dizziness, anxiety immediately after episode of dark tarry stool, that prompted her to call EMS. Denies BRB per rectum, denies nausea, vomiting, diarrhea. Fevers, chest pain, shortness of breath, lightheadedness, palpitations, syncope.  Patient was seen here 2 days ago for near syncope, with normal cardiac work-up at that time. She tested negative for COVID-19 on 11/20/2020. Patient doesn't have any URI symptoms today. Denies dysuria, urinary urgency or frequency, hematuria. Patient did take her PPI this morning as scheduled.  Patient states that at this time she is feeling better, and is ready to go home. She is no longer feeling dizzy or lightheaded at all, does not have any belly pain.  Personally viewed the patient's medical records. She has history of hemorrhoids, iron deficiency anemia, small bowel AVMs, CVA, hypercholesterolemia, hypertension, arthritis.  HPI     Past Medical History:  Diagnosis Date  . Acid reflux   . Arthritis   . 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 Bronchitis january 2014  . Hiatal hernia   . High cholesterol   . History of contact dermatitis   . Hx: recurrent pneumonia   . Hypertension   . IDA (iron deficiency anemia)    chronic iron infusions  . Reflux   . Vertigo     Patient Active Problem List   Diagnosis Date Noted  . Dark  stools 12/18/2019  . GNR UTI (urinary tract infection) 09/06/2019  . 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 Abnormal CT of the abdomen 02/02/2017  . Rectal bleeding 02/02/2017  . Aortic atherosclerosis ([place]) 01/03/2017  . Colitis 01/02/2017  . Hypokalemia 01/02/2017  . Heme positive stool 06/10/2015  . Change in bowel habits 06/10/2015  . Abdominal pain, epigastric 06/10/2015  . High cholesterol   . Acid reflux   . GERD 01/03/2011  . HEMORRHOIDS 04/15/2010  . Iron deficiency anemia due to chronic blood loss 01/12/2010  . 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 ABDOMINAL HYSTERECTOMY    . APPENDECTOMY    . BILATERAL BREAST BIOPSIES  BENIGN    . CHOLECYSTECTOMY    . COLONOSCOPY N/A 06/29/2015   SLF: The left colon is redundant 2. four colon polyps removed. No source  for change in bowel habits identified 3. Rectal bleeding due to large internal hemorrhoids   . COLONOSCOPY W/ BIOPSIES  6 2010   Dr. Oneida Alar: Polypoid appearing lesion of the ascending colon, 3 mm sessile rectal polyp, small internal hemorrhoids. Pathology revealed polypoid mucosa, no adenomatous changes  . Double-balloon enteroscopy, antegrade  April 2011   Dr. 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  Marland Kitchen ESOPHAGOGASTRODUODENOSCOPY  04/2009   Dr. Oneida Alar: Patent Schatzki ring dilated with advancing the scope, patchy erythema in the antrum, 2 small AVMs in the duodenal bulb, 2 additional AVMs noted in the second portion the  duodenum, mild gastritis on pathology  . ESOPHAGOGASTRODUODENOSCOPY N/A 06/29/2015   SLF: 1. 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 small hiatal hernia 3. Mild non-erosive gastririts- NO obvious source for dyspepsia identified.   . ESOPHAGOGASTRODUODENOSCOPY N/A 03/13/2020   Procedure: ESOPHAGOGASTRODUODENOSCOPY (EGD);  Surgeon: Danie Binder, MD;  Location: [facility];  Service: Endoscopy;   Laterality: N/A;  2:15pm - pt can not come any earlier  . EYE SURGERY       OB History    Gravida  2   Para  2   Term  2   Preterm      AB      Living  1     SAB      IAB      Ectopic      Multiple      Live Births              Family History  Problem Relation Age of Onset  . Heart failure Mother   . Diabetes Sister   . Heart attack Father   . Colon cancer Neg Hx   . Gastric cancer Neg Hx   . Esophageal cancer Neg Hx     Social History   Tobacco Use  . Smoking status: Former Smoker    Packs/day: 1.00    Years: 25.00    Mitchell years: 25.00    Types: Cigarettes    Quit date: 09/14/2007    Years since quitting: 13.2  . Smokeless tobacco: Never Used  Vaping Use  . Vaping Use: Never used  Substance Use Topics  . Alcohol use: Not Currently    Comment: occasional/rare Theadora Rama every now and then)  . Drug use: No    Home Medications Prior to Admission medications   Medication Sig Start Date End Date Taking? Authorizing Provider  acetaminophen (TYLENOL) 500 MG tablet Take 500 mg by mouth every 6 (six) hours as needed for mild pain or moderate pain.    [provider]  amitriptyline (ELAVIL) 25 MG tablet Take 25 mg by mouth at bedtime as needed for sleep.     [provider]  aspirin EC 325 MG tablet Take 325 mg by mouth daily. 03/20/20   [provider]  atorvastatin (LIPITOR) 40 MG tablet Take 1 tablet (40 mg total) by mouth every evening. 09/06/19 09/17/20  Roxan Hockey, MD  COMBIGAN 0.2-0.5 % ophthalmic solution Place 1 drop into the left eye every 12 (twelve) hours.  10/08/18   [provider]  escitalopram (LEXAPRO) 10 MG tablet Take 1 tablet by mouth daily. 09/04/20   [provider]  esomeprazole (NEXIUM) 40 MG capsule Take 40 mg by mouth daily before breakfast.      [provider]  ezetimibe (ZETIA) 10 MG tablet Take 10 mg by mouth in the morning.  11/11/19   [provider]  fish  oil-omega-3 fatty acids 1000 MG capsule Take 1 g by mouth in the morning.     [provider]  fluticasone (FLONASE) 50 MCG/ACT nasal spray Place 2 sprays into both nostrils daily.  04/11/13   [provider]  meclizine (ANTIVERT) 25 MG tablet Take 25 mg by mouth daily as needed. 08/07/20   [provider]  methylcellulose (ARTIFICIAL TEARS) 1 % ophthalmic solution Place 1 drop into both eyes 2 (two) times daily as needed (dry eyes).    [provider]  potassium chloride SA (KLOR-CON) 20 MEQ tablet Take 20 mEq by mouth 2 (  two) times daily. 03/20/20   [provider]  traMADol (ULTRAM) 50 MG tablet Take 50 mg by mouth every 6 (six) hours as needed for moderate pain or severe pain.  12/05/19   [provider]  VITAMIN B12 TR 1000 MCG TBCR Take 1 tablet by mouth in the morning.  11/11/19   [provider]  Vitamin D, Ergocalciferol, (DRISDOL) 1.25 MG (50000 UT) CAPS capsule TAKE 1 CAPSULE BY MOUTH ONCE A WEEK Patient taking differently: Take 50,000 Units by mouth every Wednesday.  11/11/19   Glennie Isle, NP-C    Allergies    Latex and Sulfa antibiotics  Review of Systems   Review of Systems  Constitutional: Negative for activity change, appetite change, chills, diaphoresis, fatigue and fever.  HENT: Negative.   Eyes: Negative.   Respiratory: Negative.   Cardiovascular: Negative.   Gastrointestinal: Positive for blood in stool. Negative for abdominal pain, anal bleeding, constipation, diarrhea, nausea, rectal pain and vomiting.  Genitourinary: Negative.   Musculoskeletal: Negative.   Skin: Negative.   Neurological: Positive for dizziness.  Hematological: Negative.   Psychiatric/Behavioral: Negative.     Physical Exam Updated Vital Signs BP 107/84 (BP Location: Left Arm)   Pulse 95   Temp 98.9 F (37.2 C) (Oral)   Resp 18   Ht 5\' 4"  (1.626 m)   Wt 68 kg   SpO2 99%   BMI 25.75 kg/m   Physical Exam Vitals and nursing  Mitchell reviewed. Exam conducted with a chaperone present.  Constitutional:      Appearance: She is normal weight.  HENT:     Head: Normocephalic and atraumatic.     Nose: Nose normal.     Mouth/Throat:     Mouth: Mucous membranes are moist.     Pharynx: Oropharynx is clear. Uvula midline. No pharyngeal swelling, oropharyngeal exudate, posterior oropharyngeal erythema or uvula swelling.  Eyes:     General:        Right eye: No discharge.        Left eye: No discharge.     Extraocular Movements: Extraocular movements intact.     Conjunctiva/sclera: Conjunctivae normal.     Pupils: Pupils are equal, round, and reactive to light.  Neck:     Trachea: Trachea and phonation normal.  Cardiovascular:     Rate and Rhythm: Normal rate and regular rhythm.     Pulses:          Radial pulses are 2+ on the right side and 2+ on the left side.       Dorsalis pedis pulses are 1+ on the right side and 1+ on the left side.     Heart sounds: Normal heart sounds. No murmur heard.   Pulmonary:     Effort: Pulmonary effort is normal. No respiratory distress.     Breath sounds: Normal breath sounds. No wheezing or rales.  Chest:     Chest wall: No deformity, swelling, tenderness or crepitus.  Abdominal:     General: Bowel sounds are normal. There is no distension.     Palpations: Abdomen is soft.     Tenderness: There is no abdominal tenderness. There is no right CVA tenderness, left CVA tenderness or guarding.  Genitourinary:    Rectum: Normal.     Comments: Hemoccult negative. Musculoskeletal:        General: No deformity.     Cervical back: Neck supple. No rigidity, tenderness or crepitus. No pain with movement or spinous process tenderness.  Right lower leg: No edema.     Left lower leg: No edema.  Lymphadenopathy:     Cervical: No cervical adenopathy.  Skin:    General: Skin is warm and dry.     Capillary Refill: Capillary refill takes less than 2 seconds.  Neurological:     General: No  focal deficit present.     Mental Status: She is alert and oriented to person, place, and time.  Psychiatric:        Mood and Affect: Mood normal.     ED Results / Procedures / Treatments   Labs (all labs ordered are listed, but only abnormal results are displayed) Labs Reviewed  COMPREHENSIVE METABOLIC PANEL - Abnormal; Notable for the following components:      Result Value   Glucose, Bld 67 (*)    All other components within normal limits  CBC - Abnormal; Notable for the following components:   RBC 3.60 (*)    Hemoglobin 10.2 (*)    HCT 34.1 (*)    MCHC 29.9 (*)    All other components within normal limits  POC OCCULT BLOOD, ED  CBG MONITORING, ED  TYPE AND SCREEN    EKG EKG: normal sinus rhythm, nonspecific T changes, no STEMI.   Radiology No results found.  Procedures Procedures (including critical care time)  Medications Ordered in ED Medications - No data to display  ED Course  I have reviewed the triage vital signs and the nursing notes.  Pertinent labs & imaging results that were available during my care of the patient were reviewed by me and considered in my medical decision making (see chart for details).    MDM Rules/Calculators/A&P                         80 year old female with known history of small bowel AVMs and IDA secondary to chronic GI bleeding presents today with concern for episode of dark sticky stool, and subsequent dizziness/anxiety.  Concern for acute symptomatic anemia given patient's history of chronic GI bleeding.  Physical exam was very reassuring.  Cardiopulmonary exam is normal, abdominal exam is benign.  Patient is alert and oriented.  Laboratory studies obtained in triage revealed mild anemia with hemoglobin 10.2, previously 10.7 three days ago.  CMP with mild hyperglycemia of 67.  Fecal occult blood negative.  EKG did not reveal any arrhythmia, no STEMI.  Orthostatic vital signs were reassuring.  Repeat CBG was 94 after PO intake  while in the emergency department.  Vital signs have remained within normal limits throughout the patient's stay in the emergency department.  Given reassuring physical exam, laboratory studies, vital signs, and EKG, no further work-up is warranted in the emergency department this time.  I suspect this patient is having melena secondary to known small bowel AVMs and chronic GI bleeding.  Recommend she follow-up closely with her gastroenterologist, whom she should call today.   Maud voiced understanding of her medical evaluation and treatment plan.  Each of her questions were answered to her expressed satisfaction.  Strict return precautions were given.  Patient is well-appearing, stable, and appropriate for discharge at this time.  This chart was dictated using voice recognition software, Dragon. Despite the best efforts of this provider to proofread and correct errors, errors may still occur which can change documentation meaning.  Final Clinical Impression(s) / ED Diagnoses Final diagnoses:  None    Rx / DC Orders ED Discharge Orders  None       Aura Dials 11/23/20 1511    Milton Ferguson, MD 11/23/20 1512

## 2020-11-23 NOTE — ED Triage Notes (Signed)
Pt presents to ED via [facility] for black stools starting this am.

## 2020-11-23 NOTE — ED Notes (Signed)
Pt given ginger-ale. Peanut butter and crackers.

## 2020-11-23 NOTE — Discharge Instructions (Addendum)
You were seen in the emergency department today for your concern for dark tarry stool.  Your blood work, vital signs, and physical exam were also very reassuring, as well as your EKG.     Please call your gastroenterologist this afternoon to schedule follow-up appointment.  Return to the emergency department if you develop any abdominal pain, nausea vomiting that does not stop, bright red blood in your stool, lightheadedness or passing out, or if you develops any other new severe symptoms.

## 2020-12-07 ENCOUNTER — Other Ambulatory Visit: Payer: Self-pay

## 2020-12-07 ENCOUNTER — Encounter (HOSPITAL_COMMUNITY): Payer: Self-pay | Admitting: *Deleted

## 2020-12-07 ENCOUNTER — Emergency Department (HOSPITAL_COMMUNITY)
Admission: EM | Admit: 2020-12-07 | Discharge: 2020-12-07 | Disposition: A | Discharge status: Home / Self Care | Payer: Medicare Other | Attending: Emergency Medicine | Admitting: Emergency Medicine

## 2020-12-07 DIAGNOSIS — Z5321 Procedure and treatment not carried out due to patient leaving prior to being seen by health care provider: Secondary | ICD-10-CM | POA: Diagnosis not present

## 2020-12-07 DIAGNOSIS — R42 Dizziness and giddiness: Secondary | ICD-10-CM | POA: Insufficient documentation

## 2020-12-07 DIAGNOSIS — R110 Nausea: Secondary | ICD-10-CM | POA: Diagnosis not present

## 2020-12-07 LAB — CBC
HCT: 35.5 % — ABNORMAL LOW (ref 36.0–46.0)
Hemoglobin: 10.7 g/dL — ABNORMAL LOW (ref 12.0–15.0)
MCH: 28.2 pg (ref 26.0–34.0)
MCHC: 30.1 g/dL (ref 30.0–36.0)
MCV: 93.4 fL (ref 80.0–100.0)
Platelets: 346 10*3/uL (ref 150–400)
RBC: 3.8 MIL/uL — ABNORMAL LOW (ref 3.87–5.11)
RDW: 14.7 % (ref 11.5–15.5)
WBC: 5.5 10*3/uL (ref 4.0–10.5)
nRBC: 0 % (ref 0.0–0.2)

## 2020-12-07 LAB — BASIC METABOLIC PANEL
Anion gap: 11 (ref 5–15)
BUN: 11 mg/dL (ref 8–23)
CO2: 24 mmol/L (ref 22–32)
Calcium: 9.8 mg/dL (ref 8.9–10.3)
Chloride: 105 mmol/L (ref 98–111)
Creatinine, Ser: 0.95 mg/dL (ref 0.44–1.00)
GFR, Estimated: 60 mL/min — ABNORMAL LOW (ref >60)
Glucose, Bld: 87 mg/dL (ref 70–99)
Potassium: 4.5 mmol/L (ref 3.5–5.1)
Sodium: 140 mmol/L (ref 135–145)

## 2020-12-07 NOTE — ED Triage Notes (Signed)
Dizziness with nausea onset this am

## 2020-12-09 ENCOUNTER — Emergency Department (HOSPITAL_COMMUNITY)
Admission: EM | Admit: 2020-12-09 | Discharge: 2020-12-09 | Disposition: A | Discharge status: Home / Self Care | Payer: Medicare Other | Attending: Emergency Medicine | Admitting: Emergency Medicine

## 2020-12-09 ENCOUNTER — Emergency Department (HOSPITAL_COMMUNITY): Payer: Medicare Other

## 2020-12-09 ENCOUNTER — Encounter (HOSPITAL_COMMUNITY): Payer: Self-pay

## 2020-12-09 ENCOUNTER — Other Ambulatory Visit: Payer: Self-pay

## 2020-12-09 DIAGNOSIS — R21 Rash and other nonspecific skin eruption: Secondary | ICD-10-CM | POA: Diagnosis not present

## 2020-12-09 DIAGNOSIS — I10 Essential (primary) hypertension: Secondary | ICD-10-CM | POA: Diagnosis not present

## 2020-12-09 DIAGNOSIS — R55 Syncope and collapse: Secondary | ICD-10-CM | POA: Insufficient documentation

## 2020-12-09 DIAGNOSIS — R1013 Epigastric pain: Secondary | ICD-10-CM | POA: Diagnosis not present

## 2020-12-09 DIAGNOSIS — Z7982 Long term (current) use of aspirin: Secondary | ICD-10-CM | POA: Diagnosis not present

## 2020-12-09 DIAGNOSIS — Z91040 Latex allergy status: Secondary | ICD-10-CM | POA: Diagnosis not present

## 2020-12-09 DIAGNOSIS — Z79899 Other long term (current) drug therapy: Secondary | ICD-10-CM | POA: Diagnosis not present

## 2020-12-09 DIAGNOSIS — R195 Other fecal abnormalities: Secondary | ICD-10-CM | POA: Diagnosis not present

## 2020-12-09 DIAGNOSIS — Z87891 Personal history of nicotine dependence: Secondary | ICD-10-CM | POA: Diagnosis not present

## 2020-12-09 LAB — CBC WITH DIFFERENTIAL/PLATELET
Abs Immature Granulocytes: 0.01 10*3/uL (ref 0.00–0.07)
Basophils Absolute: 0 10*3/uL (ref 0.0–0.1)
Basophils Relative: 0 %
Eosinophils Absolute: 0.1 10*3/uL (ref 0.0–0.5)
Eosinophils Relative: 3 %
HCT: 32.6 % — ABNORMAL LOW (ref 36.0–46.0)
Hemoglobin: 9.7 g/dL — ABNORMAL LOW (ref 12.0–15.0)
Immature Granulocytes: 0 %
Lymphocytes Relative: 25 %
Lymphs Abs: 1 10*3/uL (ref 0.7–4.0)
MCH: 27.6 pg (ref 26.0–34.0)
MCHC: 29.8 g/dL — ABNORMAL LOW (ref 30.0–36.0)
MCV: 92.9 fL (ref 80.0–100.0)
Monocytes Absolute: 0.4 10*3/uL (ref 0.1–1.0)
Monocytes Relative: 9 %
Neutro Abs: 2.7 10*3/uL (ref 1.7–7.7)
Neutrophils Relative %: 63 %
Platelets: 294 10*3/uL (ref 150–400)
RBC: 3.51 MIL/uL — ABNORMAL LOW (ref 3.87–5.11)
RDW: 14.8 % (ref 11.5–15.5)
WBC: 4.3 10*3/uL (ref 4.0–10.5)
nRBC: 0 % (ref 0.0–0.2)

## 2020-12-09 LAB — POC OCCULT BLOOD, ED: Fecal Occult Bld: POSITIVE — AB

## 2020-12-09 LAB — COMPREHENSIVE METABOLIC PANEL
ALT: 11 U/L (ref 0–44)
AST: 22 U/L (ref 15–41)
Albumin: 3.8 g/dL (ref 3.5–5.0)
Alkaline Phosphatase: 56 U/L (ref 38–126)
Anion gap: 7 (ref 5–15)
BUN: 10 mg/dL (ref 8–23)
CO2: 25 mmol/L (ref 22–32)
Calcium: 9.4 mg/dL (ref 8.9–10.3)
Chloride: 108 mmol/L (ref 98–111)
Creatinine, Ser: 0.9 mg/dL (ref 0.44–1.00)
GFR, Estimated: >60 mL/min (ref >60)
Glucose, Bld: 97 mg/dL (ref 70–99)
Potassium: 3.9 mmol/L (ref 3.5–5.1)
Sodium: 140 mmol/L (ref 135–145)
Total Bilirubin: 0.4 mg/dL (ref 0.3–1.2)
Total Protein: 7.2 g/dL (ref 6.5–8.1)

## 2020-12-09 LAB — CBC
HCT: 32.1 % — ABNORMAL LOW (ref 36.0–46.0)
Hemoglobin: 9.6 g/dL — ABNORMAL LOW (ref 12.0–15.0)
MCH: 27.7 pg (ref 26.0–34.0)
MCHC: 29.9 g/dL — ABNORMAL LOW (ref 30.0–36.0)
MCV: 92.8 fL (ref 80.0–100.0)
Platelets: 311 10*3/uL (ref 150–400)
RBC: 3.46 MIL/uL — ABNORMAL LOW (ref 3.87–5.11)
RDW: 14.8 % (ref 11.5–15.5)
WBC: 4.3 10*3/uL (ref 4.0–10.5)
nRBC: 0 % (ref 0.0–0.2)

## 2020-12-09 LAB — LIPASE, BLOOD: Lipase: 49 U/L (ref 11–51)

## 2020-12-09 MED ORDER — PANTOPRAZOLE SODIUM 40 MG IV SOLR
40.0000 mg | Freq: Once | INTRAVENOUS | Status: AC
  Administered 2020-12-09: 40 mg via INTRAVENOUS
  Filled 2020-12-09: qty 40

## 2020-12-09 MED ORDER — LIDOCAINE VISCOUS HCL 2 % MT SOLN
15.0000 mL | Freq: Once | OROMUCOSAL | Status: AC
  Administered 2020-12-09: 15 mL via ORAL
  Filled 2020-12-09: qty 15

## 2020-12-09 MED ORDER — ALUM & MAG HYDROXIDE-SIMETH 200-200-20 MG/5ML PO SUSP
30.0000 mL | Freq: Once | ORAL | Status: AC
  Administered 2020-12-09: 30 mL via ORAL
  Filled 2020-12-09: qty 30

## 2020-12-09 MED ORDER — IOHEXOL 350 MG/ML SOLN
100.0000 mL | Freq: Once | INTRAVENOUS | Status: AC | PRN
  Administered 2020-12-09: 100 mL via INTRAVENOUS

## 2020-12-09 MED ORDER — SODIUM CHLORIDE 0.9 % IV BOLUS
1000.0000 mL | Freq: Once | INTRAVENOUS | Status: AC
  Administered 2020-12-09: 1000 mL via INTRAVENOUS

## 2020-12-09 NOTE — ED Provider Notes (Signed)
[facility] EMERGENCY DEPARTMENT Provider Note   CSN: [id number] Arrival date & time: 12/09/20  0756     History Chief Complaint  Patient presents with  . Weakness    Melody Mitchell is a 80 y.o. female.  HPI 80 year old female presents with near syncope.  When she woke up this morning around 7 she started to feel shaky and sweaty when she stood up.  Went to the bathroom to have a bowel movement.  It was dark brown but not black or bloody.  Felt lightheaded but never actually passed out.  Currently she is feeling a lot better.  She also endorses weeks of epigastric pain that seems to come and go.  There is a small rash over the area that she is not sure if this is contributory.  No recent illness such as fevers.  No chest pain, shortness of breath, headache.   Past Medical History:  Diagnosis Date  . Acid reflux   . Arthritis   . 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 Bronchitis january 2014  . Hiatal hernia   . High cholesterol   . History of contact dermatitis   . Hx: recurrent pneumonia   . Hypertension   . IDA (iron deficiency anemia)    chronic iron infusions  . Reflux   . Vertigo     Patient Active Problem List   Diagnosis Date Noted  . Dark stools 12/18/2019  . GNR UTI (urinary tract infection) 09/06/2019  . 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 Abnormal CT of the abdomen 02/02/2017  . Rectal bleeding 02/02/2017  . Aortic atherosclerosis ([place]) 01/03/2017  . Colitis 01/02/2017  . Hypokalemia 01/02/2017  . Heme positive stool 06/10/2015  . Change in bowel habits 06/10/2015  . Abdominal pain, epigastric 06/10/2015  . High cholesterol   . Acid reflux   . GERD 01/03/2011  . HEMORRHOIDS 04/15/2010  . Iron deficiency anemia due to chronic blood loss 01/12/2010  . 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 ABDOMINAL  HYSTERECTOMY    . APPENDECTOMY    . BILATERAL BREAST BIOPSIES  BENIGN    . CHOLECYSTECTOMY    . COLONOSCOPY N/A 06/29/2015   SLF: The left colon is redundant 2. four colon polyps removed. No source  for change in bowel habits identified 3. Rectal bleeding due to large internal hemorrhoids   . COLONOSCOPY W/ BIOPSIES  6 2010   Dr. Oneida Alar: Polypoid appearing lesion of the ascending colon, 3 mm sessile rectal polyp, small internal hemorrhoids. Pathology revealed polypoid mucosa, no adenomatous changes  . Double-balloon enteroscopy, antegrade  April 2011   Dr. 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  Marland Kitchen ESOPHAGOGASTRODUODENOSCOPY  04/2009   Dr. Oneida Alar: Patent Schatzki ring dilated with advancing the scope, patchy erythema in the antrum, 2 small AVMs in the duodenal bulb, 2 additional AVMs noted in the second portion the duodenum, mild gastritis on pathology  . ESOPHAGOGASTRODUODENOSCOPY N/A 06/29/2015   SLF: 1. 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 small hiatal hernia 3. Mild non-erosive gastririts- NO obvious source for dyspepsia identified.   . ESOPHAGOGASTRODUODENOSCOPY N/A 03/13/2020   Procedure: ESOPHAGOGASTRODUODENOSCOPY (EGD);  Surgeon: Danie Binder, MD;  Location: [facility] ENDO SUITE;  Service: Endoscopy;  Laterality: N/A;  2:15pm - pt can not come any earlier  . EYE SURGERY  OB History    Gravida  2   Para  2   Term  2   Preterm      AB      Living  1     SAB      IAB      Ectopic      Multiple      Live Births              Family History  Problem Relation Age of Onset  . Heart failure Mother   . Diabetes Sister   . Heart attack Father   . Colon cancer Neg Hx   . Gastric cancer Neg Hx   . Esophageal cancer Neg Hx     Social History   Tobacco Use  . Smoking status: Former Smoker    Packs/day: 1.00    Years: 25.00    Pack years: 25.00    Types: Cigarettes    Quit date: 09/14/2007    Years since quitting: 13.2   . Smokeless tobacco: Never Used  Vaping Use  . Vaping Use: Never used  Substance Use Topics  . Alcohol use: Not Currently    Comment: occasional/rare Theadora Rama every now and then)  . Drug use: No    Home Medications Prior to Admission medications   Medication Sig Start Date End Date Taking? Authorizing Provider  acetaminophen (TYLENOL) 500 MG tablet Take 500 mg by mouth every 6 (six) hours as needed for mild pain or moderate pain.    [provider]  amitriptyline (ELAVIL) 25 MG tablet Take 25 mg by mouth at bedtime as needed for sleep.     [provider]  aspirin EC 325 MG tablet Take 325 mg by mouth daily. 03/20/20   [provider]  atorvastatin (LIPITOR) 40 MG tablet Take 1 tablet (40 mg total) by mouth every evening. 09/06/19 09/17/20  Roxan Hockey, MD  COMBIGAN 0.2-0.5 % ophthalmic solution Place 1 drop into the left eye every 12 (twelve) hours.  10/08/18   [provider]  escitalopram (LEXAPRO) 10 MG tablet Take 1 tablet by mouth daily. 09/04/20   [provider]  esomeprazole (NEXIUM) 40 MG capsule Take 40 mg by mouth daily before breakfast.      [provider]  ezetimibe (ZETIA) 10 MG tablet Take 10 mg by mouth in the morning.  11/11/19   [provider]  fish oil-omega-3 fatty acids 1000 MG capsule Take 1 g by mouth in the morning.     [provider]  fluticasone (FLONASE) 50 MCG/ACT nasal spray Place 2 sprays into both nostrils daily.  04/11/13   [provider]  meclizine (ANTIVERT) 25 MG tablet Take 25 mg by mouth daily as needed. 08/07/20   [provider]  methylcellulose (ARTIFICIAL TEARS) 1 % ophthalmic solution Place 1 drop into both eyes 2 (two) times daily as needed (dry eyes).    [provider]  potassium chloride SA (KLOR-CON) 20 MEQ tablet Take 20 mEq by mouth 2 (two) times daily. 03/20/20   [provider]  traMADol (ULTRAM) 50 MG tablet Take 50 mg by mouth  every 6 (six) hours as needed for moderate pain or severe pain.  12/05/19   [provider]  VITAMIN B12 TR 1000 MCG TBCR Take 1 tablet by mouth in the morning.  11/11/19   [provider]  Vitamin D, Ergocalciferol, (DRISDOL) 1.25 MG (50000 UT) CAPS capsule TAKE 1 CAPSULE BY MOUTH ONCE A WEEK Patient  taking differently: Take 50,000 Units by mouth every Wednesday.  11/11/19   Glennie Isle, NP-C    Allergies    Latex and Sulfa antibiotics  Review of Systems   Review of Systems  Constitutional: Negative for fever.  Respiratory: Negative for shortness of breath.   Cardiovascular: Negative for chest pain.  Gastrointestinal: Positive for abdominal pain. Negative for blood in stool.  Neurological: Positive for light-headedness. Negative for syncope.  All other systems reviewed and are negative.   Physical Exam Updated Vital Signs BP (!) 125/106   Pulse 91   Temp 98.9 F (37.2 C) (Oral)   Resp 17   Ht 5\' 4"  (1.626 m)   Wt 67.1 kg   SpO2 97%   BMI 25.40 kg/m   Physical Exam Vitals and nursing note reviewed. Exam conducted with a chaperone present.  Constitutional:      General: She is not in acute distress.    Appearance: She is well-developed and well-nourished. She is not ill-appearing or diaphoretic.  HENT:     Head: Normocephalic and atraumatic.     Right Ear: External ear normal.     Left Ear: External ear normal.     Nose: Nose normal.  Eyes:     General:        Right eye: No discharge.        Left eye: No discharge.     Extraocular Movements: Extraocular movements intact.     Pupils: Pupils are equal, round, and reactive to light.  Cardiovascular:     Rate and Rhythm: Normal rate and regular rhythm.     Heart sounds: Normal heart sounds.  Pulmonary:     Effort: Pulmonary effort is normal.     Breath sounds: Normal breath sounds.  Abdominal:     General: There is no distension.     Palpations: Abdomen is soft.     Tenderness: There is no  abdominal tenderness.    Genitourinary:    Rectum: Guaiac result positive (brown in appearance, guaiac positive).  Skin:    General: Skin is warm and dry.  Neurological:     Mental Status: She is alert.     Comments: CN 3-12 grossly intact. 5/5 strength in all 4 extremities. Grossly normal sensation  Psychiatric:        Mood and Affect: Mood is not anxious.     ED Results / Procedures / Treatments   Labs (all labs ordered are listed, but only abnormal results are displayed) Labs Reviewed  CBC WITH DIFFERENTIAL/PLATELET - Abnormal; Notable for the following components:      Result Value   RBC 3.51 (*)    Hemoglobin 9.7 (*)    HCT 32.6 (*)    MCHC 29.8 (*)    All other components within normal limits  CBC - Abnormal; Notable for the following components:   RBC 3.46 (*)    Hemoglobin 9.6 (*)    HCT 32.1 (*)    MCHC 29.9 (*)    All other components within normal limits  POC OCCULT BLOOD, ED - Abnormal; Notable for the following components:   Fecal Occult Bld POSITIVE (*)    All other components within normal limits  COMPREHENSIVE METABOLIC PANEL  LIPASE, BLOOD    EKG EKG Interpretation  Date/Time:  Wednesday December 09 2020 08:08:32 EST Ventricular Rate:  84 PR Interval:    QRS Duration: 93 QT Interval:  381 QTC Calculation: 451 R Axis:   4 Text Interpretation: Sinus rhythm  Low voltage, precordial leads Nonspecific T abnrm, anterolateral leads T waves similar to 2 days ago and Nov 20 2020 Confirmed by Sherwood Gambler [id number]) on 12/09/2020 8:17:42 AM   Radiology CT Angio Abd/Pel W and/or Wo Contrast  Result Date: 12/09/2020 CLINICAL DATA:  80 year old with weakness and dark stools. Evaluate for GI bleed. EXAM: CTA ABDOMEN AND PELVIS WITHOUT AND WITH CONTRAST TECHNIQUE: Multidetector CT imaging of the abdomen and pelvis was performed using the standard protocol during bolus administration of intravenous contrast. Multiplanar reconstructed images and MIPs were obtained  and reviewed to evaluate the vascular anatomy. CONTRAST:  1103mL OMNIPAQUE IOHEXOL 350 MG/ML SOLN COMPARISON:  CT abdomen and pelvis 08/04/2019 and 01/02/2017 FINDINGS: VASCULAR Aorta: Atherosclerotic calcifications involving the abdominal aorta without aneurysm, stenosis or dissection. Celiac: Patent without evidence of aneurysm, dissection, vasculitis or significant stenosis. SMA: Patent without evidence of aneurysm, dissection, vasculitis or significant stenosis. Renals: Both renal arteries are patent without evidence of aneurysm, dissection, vasculitis, fibromuscular dysplasia or significant stenosis. IMA: Patent Inflow: Patent without evidence of aneurysm, dissection, vasculitis or significant stenosis. Tortuosity of the iliac arteries. Proximal Outflow: Proximal femoral arteries are patent bilaterally. Veins: Portal venous system is patent. SMV is patent. Bilateral renal veins are patent. There may be some narrowing near the proximal aspect of the left renal vein. Common iliac veins are patent. Review of the MIP images confirms the above findings. NON-VASCULAR Lower chest: Few densities at the lung bases are suggestive for mild atelectasis or scarring. No significant pleural fluid. Hepatobiliary: Extrahepatic bile duct is prominent measuring up to 8 mm and similar to the prior examination. Gallbladder has been removed. No significant intrahepatic biliary dilatation. No suspicious liver lesion. Pancreas: Unremarkable. No pancreatic ductal dilatation or surrounding inflammatory changes. Spleen: Small calcifications.  No acute abnormality. Adrenals/Urinary Tract: Normal adrenal glands. Negative for kidney stones. No hydronephrosis. Small hypodensity in the right kidney lower pole likely represents a small cyst. No suspicious renal lesions. Fluid in the urinary bladder. Stomach/Bowel: High-density material in the colon on the pre contrast images. Scattered colonic diverticula. Appendix is not confidently  identified. No evidence for active GI bleeding or acute bowel inflammation. No evidence for bowel obstruction. Lymphatic: No abdominal or pelvic lymph node enlargement. Reproductive: Uterus has been removed. There is a prominent left ovarian or adnexal structure along the left pelvic sidewall on sequence 17, image 61. This area measures 2.8 x 2.4 cm and minimally changed since 2018. Other: Negative for ascites. No acute inflammatory changes within the abdomen or pelvis. Tiny periumbilical hernia containing fat. Musculoskeletal: Disc space narrowing and endplate changes at [id number] and similar findings in 2020. IMPRESSION: VASCULAR 1. No evidence for active GI bleeding. 2. Atherosclerotic disease in the abdominal aorta without aneurysm or dissection. Main visceral arteries are patent without significant stenosis. Aortic Atherosclerosis (ICD10-I70.0). NON-VASCULAR 1. No acute abnormality in the abdomen or pelvis. 2. Cholecystectomy. 3. Left ovarian/adnexal tissue is prominent but unchanged since 2018. This is compatible with a benign finding. Electronically Signed   By: Markus Daft M.D.   On: 12/09/2020 10:57    Procedures Procedures   Medications Ordered in ED Medications  sodium chloride 0.9 % bolus 1,000 mL (0 mLs Intravenous Stopped 12/09/20 0939)  alum & mag hydroxide-simeth (MAALOX/MYLANTA) 200-200-20 MG/5ML suspension 30 mL (30 mLs Oral Given 12/09/20 0825)    And  lidocaine (XYLOCAINE) 2 % viscous mouth solution 15 mL (15 mLs Oral Given 12/09/20 0825)  pantoprazole (PROTONIX) injection 40 mg (40 mg Intravenous Given 12/09/20 0937)  iohexol (OMNIPAQUE) 350 MG/ML injection 100 mL (100 mLs Intravenous Contrast Given 12/09/20 1006)    ED Course  I have reviewed the triage vital signs and the nursing notes.  Pertinent labs & imaging results that were available during my care of the patient were reviewed by me and considered in my medical decision making (see chart for details).  Clinical Course as of  12/09/20 1311  Wed Dec 09, 2020  3382 I discussed workup with Dr. Laural Golden. Borderline admit vs d/c with outpatient GI follow up. Will get repeat CBC in 4 hours. CTA vs bleeding scan if potentially going home. If worse will need admit [SG]    Clinical Course User Index [SG] Sherwood Gambler, MD   MDM Rules/Calculators/A&P                          Patient is feeling much better after treatment in the ED.  CT angiography has been personally reviewed and is unremarkable.  Repeat CBC is 9.6.  We will have her follow-up closely with outpatient gastroenterology.  She is already on a PPI.  Otherwise she is feeling well and I think she is stable for outpatient follow-up.  We discussed return precautions. Final Clinical Impression(s) / ED Diagnoses Final diagnoses:  Near syncope    Rx / DC Orders ED Discharge Orders    None       Sherwood Gambler, MD 12/09/20 1311

## 2020-12-09 NOTE — ED Triage Notes (Signed)
Pt brought to ED via [facility] for generalized weakness. Pt states she woke up about 0400 to go to bathroom, her stools was really dark, she then became really weak,and diaphoretic, felt like she was going to pass out.

## 2020-12-09 NOTE — Discharge Instructions (Addendum)
If you develop new or worsening dizziness, lightheadedness, chest pain, abdominal pain, blood in your stool or any other new/concerning symptoms then return to the ER for evaluation.

## 2020-12-15 ENCOUNTER — Encounter (HOSPITAL_COMMUNITY): Payer: Self-pay | Admitting: *Deleted

## 2020-12-15 ENCOUNTER — Other Ambulatory Visit: Payer: Self-pay

## 2020-12-15 ENCOUNTER — Emergency Department (HOSPITAL_COMMUNITY)
Admission: EM | Admit: 2020-12-15 | Discharge: 2020-12-15 | Disposition: A | Discharge status: Home / Self Care | Payer: 59 | Attending: Emergency Medicine | Admitting: Emergency Medicine

## 2020-12-15 DIAGNOSIS — R42 Dizziness and giddiness: Secondary | ICD-10-CM | POA: Insufficient documentation

## 2020-12-15 DIAGNOSIS — Z5321 Procedure and treatment not carried out due to patient leaving prior to being seen by health care provider: Secondary | ICD-10-CM | POA: Diagnosis not present

## 2020-12-15 NOTE — ED Triage Notes (Signed)
Pt with dizziness since 0630 this morning. Hx of vertigo. Pt denies any sob or CP.

## 2020-12-15 NOTE — ED Notes (Addendum)
Pt denies taking her medication Meclizine today. [facility] reported that [place] [place] to be seen and transported.

## 2020-12-21 ENCOUNTER — Other Ambulatory Visit (HOSPITAL_COMMUNITY): Payer: Self-pay | Admitting: *Deleted

## 2020-12-21 DIAGNOSIS — D500 Iron deficiency anemia secondary to blood loss (chronic): Secondary | ICD-10-CM

## 2020-12-21 DIAGNOSIS — E559 Vitamin D deficiency, unspecified: Secondary | ICD-10-CM

## 2020-12-22 ENCOUNTER — Other Ambulatory Visit: Payer: Self-pay

## 2020-12-22 ENCOUNTER — Inpatient Hospital Stay (HOSPITAL_COMMUNITY): Payer: 59

## 2020-12-22 ENCOUNTER — Inpatient Hospital Stay (HOSPITAL_COMMUNITY): Payer: 59 | Attending: Hematology

## 2020-12-22 DIAGNOSIS — E559 Vitamin D deficiency, unspecified: Secondary | ICD-10-CM | POA: Insufficient documentation

## 2020-12-22 DIAGNOSIS — D500 Iron deficiency anemia secondary to blood loss (chronic): Secondary | ICD-10-CM

## 2020-12-22 DIAGNOSIS — D508 Other iron deficiency anemias: Secondary | ICD-10-CM | POA: Diagnosis not present

## 2020-12-22 DIAGNOSIS — E538 Deficiency of other specified B group vitamins: Secondary | ICD-10-CM | POA: Diagnosis present

## 2020-12-22 LAB — IRON AND TIBC
Iron: 23 ug/dL — ABNORMAL LOW (ref 28–170)
Saturation Ratios: 8 % — ABNORMAL LOW (ref 10.4–31.8)
TIBC: 305 ug/dL (ref 250–450)
UIBC: 282 ug/dL

## 2020-12-22 LAB — COMPREHENSIVE METABOLIC PANEL
ALT: 11 U/L (ref 0–44)
AST: 18 U/L (ref 15–41)
Albumin: 3.9 g/dL (ref 3.5–5.0)
Alkaline Phosphatase: 55 U/L (ref 38–126)
Anion gap: 7 (ref 5–15)
BUN: 12 mg/dL (ref 8–23)
CO2: 25 mmol/L (ref 22–32)
Calcium: 9.3 mg/dL (ref 8.9–10.3)
Chloride: 105 mmol/L (ref 98–111)
Creatinine, Ser: 0.85 mg/dL (ref 0.44–1.00)
GFR, Estimated: >60 mL/min (ref >60)
Glucose, Bld: 90 mg/dL (ref 70–99)
Potassium: 4.2 mmol/L (ref 3.5–5.1)
Sodium: 137 mmol/L (ref 135–145)
Total Bilirubin: 0.3 mg/dL (ref 0.3–1.2)
Total Protein: 7.4 g/dL (ref 6.5–8.1)

## 2020-12-22 LAB — CBC WITH DIFFERENTIAL/PLATELET
Abs Immature Granulocytes: 0.01 10*3/uL (ref 0.00–0.07)
Basophils Absolute: 0 10*3/uL (ref 0.0–0.1)
Basophils Relative: 0 %
Eosinophils Absolute: 0.2 10*3/uL (ref 0.0–0.5)
Eosinophils Relative: 4 %
HCT: 33.4 % — ABNORMAL LOW (ref 36.0–46.0)
Hemoglobin: 9.9 g/dL — ABNORMAL LOW (ref 12.0–15.0)
Immature Granulocytes: 0 %
Lymphocytes Relative: 25 %
Lymphs Abs: 1.3 10*3/uL (ref 0.7–4.0)
MCH: 27.7 pg (ref 26.0–34.0)
MCHC: 29.6 g/dL — ABNORMAL LOW (ref 30.0–36.0)
MCV: 93.6 fL (ref 80.0–100.0)
Monocytes Absolute: 0.5 10*3/uL (ref 0.1–1.0)
Monocytes Relative: 9 %
Neutro Abs: 3.3 10*3/uL (ref 1.7–7.7)
Neutrophils Relative %: 62 %
Platelets: 333 10*3/uL (ref 150–400)
RBC: 3.57 MIL/uL — ABNORMAL LOW (ref 3.87–5.11)
RDW: 15 % (ref 11.5–15.5)
WBC: 5.2 10*3/uL (ref 4.0–10.5)
nRBC: 0 % (ref 0.0–0.2)

## 2020-12-22 LAB — VITAMIN D 25 HYDROXY (VIT D DEFICIENCY, FRACTURES): Vit D, 25-Hydroxy: 140.53 ng/mL — ABNORMAL HIGH (ref 30–100)

## 2020-12-22 LAB — VITAMIN B12: Vitamin B-12: 864 pg/mL (ref 180–914)

## 2020-12-22 LAB — LACTATE DEHYDROGENASE: LDH: 131 U/L (ref 98–192)

## 2020-12-22 LAB — FERRITIN: Ferritin: 42 ng/mL (ref 11–307)

## 2020-12-29 ENCOUNTER — Inpatient Hospital Stay (HOSPITAL_BASED_OUTPATIENT_CLINIC_OR_DEPARTMENT_OTHER): Payer: 59 | Admitting: Oncology

## 2020-12-29 ENCOUNTER — Encounter (HOSPITAL_COMMUNITY): Payer: Self-pay | Admitting: Oncology

## 2020-12-29 ENCOUNTER — Ambulatory Visit (HOSPITAL_COMMUNITY): Payer: Medicare Other | Admitting: Hematology

## 2020-12-29 ENCOUNTER — Other Ambulatory Visit: Payer: Self-pay

## 2020-12-29 VITALS — BP 143/95 | HR 103 | Temp 96.7°F | Resp 16 | Wt 151.0 lb

## 2020-12-29 DIAGNOSIS — D500 Iron deficiency anemia secondary to blood loss (chronic): Secondary | ICD-10-CM

## 2020-12-29 NOTE — Progress Notes (Signed)
[place], [place] [address]   CLINIC:  Medical Oncology/Hematology  PCP:  [name], MD [address] [place] [address] [phone]   REASON FOR VISIT: Follow-up for iron deficiency anemia   CURRENT THERAPY: Intermittent iron infusions   INTERVAL HISTORY:  Melody Mitchell 80 y.o. female returns for routine follow-up for iron deficiency anemia. She was last seen in clinic on 08/11/2020.  In the interim, seen in the emergency room on 11/20/2020  for dizziness felt to be secondary to dehydration and orthostasis.  She was hydrated and sent home.  She was seen again a few days later on 11/23/2020 for possible rectal bleeding.  Lab work was fairly stable and fecal occult was negative.  Recommended follow-up with GI outpatient.  She was seen again on 12/09/2020 for near syncope.  Work-up included a positive guaiac and a decline in her hemoglobin.  She also endorsed epigastric pain; CT angio abdomen pelvis was performed which did not reveal any acute abnormalities.  She last received IV iron on 04/06/2020 and 04/16/2020.  Today, she feels improved since her last ED visits.  She denies any pain.  Admits to chronic fatigue.  She continues to have intermittent dark stools but this mostly has resolved.  Has follow-up with GI on Thursday.  Denies any shortness of breath or chest pain.  Appetite and weight are stable.  REVIEW OF SYSTEMS:  Review of Systems  Constitutional: Positive for fatigue.  Gastrointestinal: Positive for blood in stool (Intermittent). Negative for constipation.  Neurological: Positive for dizziness.  All other systems reviewed and are negative.    PAST MEDICAL/SURGICAL HISTORY:  Past Medical History:  Diagnosis Date  . Acid reflux   . Arthritis   . 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 Bronchitis january 2014  . Hiatal hernia   . High  cholesterol   . History of contact dermatitis   . Hx: recurrent pneumonia   . Hypertension   . IDA (iron deficiency anemia)    chronic iron infusions  . Reflux   . Vertigo    Past Surgical History:  Procedure Laterality Date  . ABDOMINAL HYSTERECTOMY    . APPENDECTOMY    . BILATERAL BREAST BIOPSIES  BENIGN    . CHOLECYSTECTOMY    . COLONOSCOPY N/A 06/29/2015   SLF: The left colon is redundant 2. four colon polyps removed. No source  for change in bowel habits identified 3. Rectal bleeding due to large internal hemorrhoids   . COLONOSCOPY W/ BIOPSIES  6 2010   Dr. Oneida Alar: Polypoid appearing lesion of the ascending colon, 3 mm sessile rectal polyp, small internal hemorrhoids. Pathology revealed polypoid mucosa, no adenomatous changes  . Double-balloon enteroscopy, antegrade  April 2011   Dr. 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  Marland Kitchen ESOPHAGOGASTRODUODENOSCOPY  04/2009   Dr. Oneida Alar: Patent Schatzki ring dilated with advancing the scope, patchy erythema in the antrum, 2 small AVMs in the duodenal bulb, 2 additional AVMs noted in the second portion the duodenum, mild gastritis on pathology  . ESOPHAGOGASTRODUODENOSCOPY N/A 06/29/2015   SLF: 1. 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 small hiatal hernia 3. Mild non-erosive gastririts- NO obvious source for dyspepsia identified.   . ESOPHAGOGASTRODUODENOSCOPY N/A 03/13/2020   Procedure: ESOPHAGOGASTRODUODENOSCOPY (EGD);  Surgeon: Danie Binder, MD;  Location: [facility];  Service: Endoscopy;  Laterality: N/A;  2:15pm - pt can not come any earlier  . EYE SURGERY       SOCIAL HISTORY:  Social History   Socioeconomic History  . Marital status: Divorced    Spouse name: Not on file  . Number of children: 1  . Years of education: Not on file  . Highest education level: Not on file  Occupational History  . Not on file  Tobacco Use  . Smoking status: Former Smoker    Packs/day: 1.00    Years:  25.00    Pack years: 25.00    Types: Cigarettes    Quit date: 09/14/2007    Years since quitting: 13.3  . Smokeless tobacco: Never Used  Vaping Use  . Vaping Use: Never used  Substance and Sexual Activity  . Alcohol use: Not Currently    Comment: occasional/rare Theadora Rama every now and then)  . Drug use: No  . Sexual activity: Yes    Birth control/protection: Post-menopausal, Surgical  Other Topics Concern  . Not on file  Social History Narrative  . Not on file   Social Determinants of Health   Financial Resource Strain: Not on file  Food Insecurity: Not on file  Transportation Needs: Not on file  Physical Activity: Not on file  Stress: Not on file  Social Connections: Not on file  Intimate Partner Violence: Not on file    FAMILY HISTORY:  Family History  Problem Relation Age of Onset  . Heart failure Mother   . Diabetes Sister   . Heart attack Father   . Colon cancer Neg Hx   . Gastric cancer Neg Hx   . Esophageal cancer Neg Hx     CURRENT MEDICATIONS:  Outpatient Encounter Medications as of 12/29/2020  Medication Sig  . acetaminophen (TYLENOL) 500 MG tablet Take 500 mg by mouth every 6 (six) hours as needed for mild pain or moderate pain.  Marland Kitchen amitriptyline (ELAVIL) 25 MG tablet Take 25 mg by mouth at bedtime as needed for sleep.   Marland Kitchen aspirin EC 325 MG tablet Take 325 mg by mouth daily.  Marland Kitchen atorvastatin (LIPITOR) 40 MG tablet Take 1 tablet (40 mg total) by mouth every evening.  . COMBIGAN 0.2-0.5 % ophthalmic solution Place 1 drop into the left eye every 12 (twelve) hours.   Marland Kitchen escitalopram (LEXAPRO) 10 MG tablet Take 1 tablet by mouth daily.  Marland Kitchen esomeprazole (NEXIUM) 40 MG capsule Take 40 mg by mouth daily before breakfast.    . ezetimibe (ZETIA) 10 MG tablet Take 10 mg by mouth in the morning.   . fish oil-omega-3 fatty acids 1000 MG capsule Take 1 g by mouth in the morning.   . fluticasone (FLONASE) 50 MCG/ACT nasal spray Place 2 sprays into both nostrils daily.   .  meclizine (ANTIVERT) 25 MG tablet Take 25 mg by mouth daily as needed.  . methylcellulose (ARTIFICIAL TEARS) 1 % ophthalmic solution Place 1 drop into both eyes 2 (two) times daily as needed (dry eyes).  . potassium chloride SA (KLOR-CON) 20 MEQ tablet Take 20 mEq by mouth 2 (two) times daily.  . traMADol (ULTRAM) 50 MG tablet Take 50 mg by mouth every 6 (six) hours as needed for moderate pain or severe pain.   Marland Kitchen VITAMIN B12 TR 1000 MCG TBCR Take 1 tablet by mouth in the morning.   . Vitamin D, Ergocalciferol, (DRISDOL) 1.25 MG (50000 UT) CAPS capsule TAKE 1 CAPSULE BY MOUTH ONCE A WEEK (Patient taking differently: Take 50,000 Units by  mouth every Wednesday. )   No facility-administered encounter medications on file as of 12/29/2020.    ALLERGIES:  Allergies  Allergen Reactions  . Latex Hives, Itching and Rash  . Sulfa Antibiotics Hives and Itching     PHYSICAL EXAM:  ECOG Performance status: 1  There were no vitals filed for this visit. There were no vitals filed for this visit. Physical Exam Constitutional:      Appearance: Normal appearance. She is normal weight.  Cardiovascular:     Rate and Rhythm: Normal rate and regular rhythm.     Heart sounds: Normal heart sounds.  Pulmonary:     Effort: Pulmonary effort is normal.     Breath sounds: Normal breath sounds.  Abdominal:     General: Bowel sounds are normal.     Palpations: Abdomen is soft.  Musculoskeletal:        General: Normal range of motion.  Skin:    General: Skin is warm.  Neurological:     Mental Status: She is alert and oriented to person, place, and time. Mental status is at baseline.  Psychiatric:        Mood and Affect: Mood normal.        Behavior: Behavior normal.        Thought Content: Thought content normal.        Judgment: Judgment normal.      LABORATORY DATA:  I have reviewed the labs as listed.  CBC    Component Value Date/Time   WBC 5.2 12/22/2020 1243   RBC 3.57 (L) 12/22/2020 1243    HGB 9.9 (L) 12/22/2020 1243   HCT 33.4 (L) 12/22/2020 1243   PLT 333 12/22/2020 1243   MCV 93.6 12/22/2020 1243   MCH 27.7 12/22/2020 1243   MCHC 29.6 (L) 12/22/2020 1243   RDW 15.0 12/22/2020 1243   LYMPHSABS 1.3 12/22/2020 1243   MONOABS 0.5 12/22/2020 1243   EOSABS 0.2 12/22/2020 1243   BASOSABS 0.0 12/22/2020 1243   CMP Latest Ref Rng & Units 12/22/2020 12/09/2020 12/07/2020  Glucose 70 - 99 mg/dL 90 97 87  BUN 8 - 23 mg/dL 12 10 11   Creatinine 0.44 - 1.00 mg/dL 0.85 0.90 0.95  Sodium 135 - 145 mmol/L 137 140 140  Potassium 3.5 - 5.1 mmol/L 4.2 3.9 4.5  Chloride 98 - 111 mmol/L 105 108 105  CO2 22 - 32 mmol/L 25 25 24   Calcium 8.9 - 10.3 mg/dL 9.3 9.4 9.8  Total Protein 6.5 - 8.1 g/dL 7.4 7.2 -  Total Bilirubin 0.3 - 1.2 mg/dL 0.3 0.4 -  Alkaline Phos 38 - 126 U/L 55 56 -  AST 15 - 41 U/L 18 22 -  ALT 0 - 44 U/L 11 11 -    All questions were answered to patient's stated satisfaction. Encouraged patient to call with any new concerns or questions before his next visit to the cancer center and we can certain see him sooner, if needed.     ASSESSMENT & PLAN:  1.  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  She also had a EGD and colonoscopy in 2016 did not show any evidence of active bleeding areas, however she had large internal hemorrhoids. -Last [name] infusion on 04/06/2020 and 04/16/2020. -Labs from 12/22/2020 show hemoglobin of 9.9, platelets 333, ferritin 42 and iron saturation of 8%. -Recommend 2 doses of IV [name]. -Patient has follow-up with GI on Thursday of this week.  2.  Vitamin D deficiency: -Labs from 12/22/2020 show a vitamin D level of 140.53. -Continue calcium and vitamin D.  3.  Borderline vitamin B12 deficiency: -Labs on 12/22/2020 show a vitamin B12 level of 864. -Continue B12 supplements.  Disposition: -Schedule 2 doses of IV [name]. -RTC in 3 months with repeat labs (CBC, CMP, vitamin B12, iron, ferritin)  and assessment.   No problem-specific Assessment & Plan notes found for this encounter.  Orders placed this encounter:  No orders of the defined types were placed in this encounter.  Faythe Casa, NP 12/29/2020 12:27 PM  [place] [phone]

## 2020-12-31 ENCOUNTER — Encounter: Payer: Self-pay | Admitting: Nurse Practitioner

## 2020-12-31 ENCOUNTER — Ambulatory Visit (INDEPENDENT_AMBULATORY_CARE_PROVIDER_SITE_OTHER): Payer: 59 | Admitting: Nurse Practitioner

## 2020-12-31 ENCOUNTER — Other Ambulatory Visit: Payer: Self-pay

## 2020-12-31 VITALS — BP 131/87 | HR 110 | Temp 96.9°F | Ht 64.0 in | Wt 149.8 lb

## 2020-12-31 DIAGNOSIS — D500 Iron deficiency anemia secondary to blood loss (chronic): Secondary | ICD-10-CM

## 2020-12-31 DIAGNOSIS — K219 Gastro-esophageal reflux disease without esophagitis: Secondary | ICD-10-CM | POA: Diagnosis not present

## 2020-12-31 DIAGNOSIS — R195 Other fecal abnormalities: Secondary | ICD-10-CM | POA: Diagnosis not present

## 2020-12-31 NOTE — Patient Instructions (Signed)
Your health issues we discussed today were:   Anemia due to chronic blood loss from your small intestines: 1. Continue to see hematology 2. Keep your appointment tomorrow for IV iron with hematology to help with your blood loss 3. Call us if you have any ongoing, worsening black stools 4. Call us if you have any symptoms of worsening blood loss such as worsening weakness, dizziness, fatigue, passing out, nearly passing out, shortness of breath, chest pain, etc. 5. If you have significant symptoms that are scary to you then you can proceed to the emergency room 6. We will plan to keep an eye on everything and follow-up in 3 months. 7. We will keep in mind that we can refer you back to [facility] as this is an ongoing problem because of significant issues with your life  GERD (reflux/heartburn): 1. I am glad you are doing well on Nexium 2. Continue taking Nexium and let us know if you have any worsening or severe symptoms  Overall I recommend:  1. Continue other current medications 2. Return for follow-up in 3 months 3. Call us for any questions or concerns   ---------------------------------------------------------------  I am glad you have gotten your COVID-19 vaccination!  Even though you are fully vaccinated you should continue to follow CDC and state/local guidelines.  ---------------------------------------------------------------   At [facility] we value your feedback. You may receive a survey about your visit today. Please share your experience as we strive to create trusting relationships with our patients to provide genuine, compassionate, quality care.  We appreciate your understanding and patience as we review any laboratory studies, imaging, and other diagnostic tests that are ordered as we care for you. Our office policy is 5 business days for review of these results, and any emergent or urgent results are addressed in a  timely manner for your best interest. If you do not hear from our office in 1 week, please contact us.   We also encourage the use of MyChart, which contains your medical information for your review as well. If you are not enrolled in this feature, an access code is on this after visit summary for your convenience. Thank you for allowing [place] to be involved in your care.  It was great to see you today!  I hope you have a safe and warm winter!!

## 2020-12-31 NOTE — Progress Notes (Signed)
Referring Provider: [name], MD Primary Care Physician:  [name], MD Primary GI:  Dr. Abbey Chatters  Chief Complaint  Patient presents with  . Gastroesophageal Reflux    F/u. Doing okay  . Anemia    C/o dark stools    HPI:   Melody Mitchell is a 80 y.o. female who presents for follow-up on GERD and anemia.  The patient was last seen in our office 09/17/2020 for the same as well as heme positive stools and dark stools.  Noted history of GI bleed, rectal bleeding, GERD, colitis.  Colonoscopy up-to-date 2016 and repeat in 10 to 15 years if benefits outweigh the risks (2020 04/16/2030).  EGD at same time a small hiatal hernia and nonerosive gastritis without obvious source for dyspepsia.  Noted CVA in October 2020.    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  Recommended repeat enteroscopy/[facility]C at [facility] if she develops transfusion dependent anemia or unable to maintain ferritin.  At her last visit noted no further dark stools since last visit, has received more IV iron with good energy and no fatigue or dizziness.  No other overt GI complaints.  GERD currently doing well on Nexium 40 mg daily.  Recommended continue to follow with hematology, call for any black stools or worsening anemia symptoms, continue PPI, follow-up in 6 months.  It appears the patient was in the emergency department 11/23/2020 for concerns of dark tarry stools.  Hemoglobin found to be 10.2, which is well within her baseline.  Occult blood was negative.  She was  subsequently discharged.  Subsequent emergency room visit 12/09/2020 for near syncope with noted dark brown stools, no obvious melena found heme positive stool and decline in hemoglobin to 9.7.  She had an outpatient hematology visit 12/29/2020.  There was some stable hemoglobin compared to 9.9 in the ED, ferritin with a significant decline from 25 four months ago to 42 at her hematology appointment.  Also a decline in iron saturation from 12% to 8%.  Overall felt again due to small bowel AVMs, recommended 2 doses of IV [name] and follow-up with GI.  Today she states she doing okay overall. Discussed recent ER and hematology visits. Is scheduled for IV Iron tomorrow. Energy is doing ok. Occasional weakness, no dizziness or syncope recently. Stools are "really dark" again, over the past couple weeks. Denies hematochezia, N/V. Denies abdominal pain, GERD symptoms. Currently on Nexium 40 mg daily. Denies URI or flu-like symptoms. Denies loss of sense of taste or smell.  The patient has received COVID-19 vaccination(s). They have also had a booster. Denies chest pain, dyspnea, dizziness, lightheadedness, syncope, near syncope. Denies any other upper or lower GI symptoms.  She is on ASA 325 due to history of CVA.  Past Medical History:  Diagnosis Date  . Acid reflux   . Arthritis   . 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 Bronchitis january 2014  . Hiatal hernia   . High cholesterol   . History of  contact dermatitis   . Hx: recurrent pneumonia   . Hypertension   . IDA (iron deficiency anemia)    chronic iron infusions  . Reflux   . Vertigo     Past Surgical History:  Procedure Laterality Date  . ABDOMINAL HYSTERECTOMY    . APPENDECTOMY    . BILATERAL BREAST BIOPSIES  BENIGN    . CHOLECYSTECTOMY    . COLONOSCOPY N/A 06/29/2015   SLF: The left colon is redundant 2. four colon polyps removed. No source  for  change in bowel habits identified 3. Rectal bleeding due to large internal hemorrhoids   . COLONOSCOPY W/ BIOPSIES  6 2010   Dr. Oneida Alar: Polypoid appearing lesion of the ascending colon, 3 mm sessile rectal polyp, small internal hemorrhoids. Pathology revealed polypoid mucosa, no adenomatous changes  . Double-balloon enteroscopy, antegrade  April 2011   Dr. 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  Marland Kitchen ESOPHAGOGASTRODUODENOSCOPY  04/2009   Dr. Oneida Alar: Patent Schatzki ring dilated with advancing the scope, patchy erythema in the antrum, 2 small AVMs in the duodenal bulb, 2 additional AVMs noted in the second portion the duodenum, mild gastritis on pathology  . ESOPHAGOGASTRODUODENOSCOPY N/A 06/29/2015   SLF: 1. 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 small hiatal hernia 3. Mild non-erosive gastririts- NO obvious source for dyspepsia identified.   . ESOPHAGOGASTRODUODENOSCOPY N/A 03/13/2020   Procedure: ESOPHAGOGASTRODUODENOSCOPY (EGD);  Surgeon: Danie Binder, MD;  Location: [facility];  Service: Endoscopy;  Laterality: N/A;  2:15pm - pt can not come any earlier  . EYE SURGERY      Current Outpatient Medications  Medication Sig Dispense Refill  . acetaminophen (TYLENOL) 500 MG tablet Take 500 mg by mouth every 6 (six) hours as needed for mild pain or moderate pain.    Marland Kitchen amitriptyline (ELAVIL) 25 MG tablet Take 25 mg by mouth at bedtime as needed for sleep.     Marland Kitchen aspirin EC 325 MG tablet Take 325 mg by mouth daily.    . COMBIGAN 0.2-0.5 % ophthalmic solution Place 1 drop into the left eye every 12 (twelve) hours.   99  . escitalopram (LEXAPRO) 10 MG tablet Take 1 tablet by mouth daily.    Marland Kitchen esomeprazole (NEXIUM) 40 MG capsule Take 40 mg by mouth daily before breakfast.    . ezetimibe (ZETIA) 10 MG tablet Take 10 mg by mouth in the morning.     . fish oil-omega-3 fatty acids 1000 MG capsule Take 1 g by mouth in the morning.     . fluticasone (FLONASE) 50  MCG/ACT nasal spray Place 2 sprays into both nostrils daily.     Marland Kitchen latanoprost (XALATAN) 0.005 % ophthalmic solution Place 1 drop into the left eye at bedtime.    . meclizine (ANTIVERT) 25 MG tablet Take 25 mg by mouth daily as needed.    . methylcellulose (ARTIFICIAL TEARS) 1 % ophthalmic solution Place 1 drop into both eyes 2 (two) times daily as needed (dry eyes).    . potassium chloride SA (KLOR-CON) 20 MEQ tablet Take 20 mEq by mouth 2 (two) times daily.    . traMADol (ULTRAM) 50 MG tablet Take 50 mg by mouth every 6 (six) hours as needed for moderate pain or severe pain.     Marland Kitchen VITAMIN B12 TR 1000 MCG TBCR Take 1 tablet by mouth in the morning.     . Vitamin D, Ergocalciferol, (DRISDOL) 1.25 MG (50000 UT) CAPS capsule TAKE 1 CAPSULE  BY MOUTH ONCE A WEEK (Patient taking differently: Take 50,000 Units by mouth every Wednesday.) 4 capsule 11  . atorvastatin (LIPITOR) 40 MG tablet Take 1 tablet (40 mg total) by mouth every evening. 30 tablet 4   No current facility-administered medications for this visit.    Allergies as of 12/31/2020 - Review Complete 12/31/2020  Allergen Reaction Noted  . Latex Hives, Itching, and Rash 07/01/2009  . Sulfa antibiotics Hives and Itching 11/21/2013  . Other Hives 12/07/2020  . Pravastatin sodium Other (See Comments) 12/07/2020    Family History  Problem Relation Age of Onset  . Heart failure Mother   . Diabetes Sister   . Heart attack Father   . Colon cancer Neg Hx   . Gastric cancer Neg Hx   . Esophageal cancer Neg Hx     Social History   Socioeconomic History  . Marital status: Divorced    Spouse name: Not on file  . Number of children: 1  . Years of education: Not on file  . Highest education level: Not on file  Occupational History  . Not on file  Tobacco Use  . Smoking status: Former Smoker    Packs/day: 1.00    Years: 25.00    Pack years: 25.00    Types: Cigarettes    Quit date: 09/14/2007    Years since quitting: 13.3  .  Smokeless tobacco: Never Used  Vaping Use  . Vaping Use: Never used  Substance and Sexual Activity  . Alcohol use: Not Currently    Comment: occasional/rare Theadora Rama every now and then)  . Drug use: No  . Sexual activity: Yes    Birth control/protection: Post-menopausal, Surgical  Other Topics Concern  . Not on file  Social History Narrative  . Not on file   Social Determinants of Health   Financial Resource Strain: Not on file  Food Insecurity: Not on file  Transportation Needs: Not on file  Physical Activity: Not on file  Stress: Not on file  Social Connections: Not on file    Subjective: Review of Systems  Constitutional: Positive for malaise/fatigue (occasional). Negative for chills, fever and weight loss.  HENT: Negative for congestion and sore throat.   Respiratory: Negative for cough and shortness of breath.   Cardiovascular: Negative for chest pain and palpitations.  Gastrointestinal: Negative for abdominal pain, blood in stool, constipation, diarrhea, heartburn, melena, nausea and vomiting.  Musculoskeletal: Negative for joint pain and myalgias.  Skin: Negative for rash.  Neurological: Negative for dizziness and weakness.  Endo/Heme/Allergies: Does not bruise/bleed easily.  Psychiatric/Behavioral: Negative for depression. The patient is not nervous/anxious.   All other systems reviewed and are negative.    Objective: BP 131/87   Pulse (!) 110   Temp (!) 96.9 F (36.1 C)   Ht 5\' 4"  (1.626 m)   Wt 149 lb 12.8 oz (67.9 kg)   BMI 25.71 kg/m  Physical Exam Vitals and nursing note reviewed.  Constitutional:      General: She is not in acute distress.    Appearance: Normal appearance. She is well-developed and normal weight. She is not ill-appearing, toxic-appearing or diaphoretic.  HENT:     Head: Normocephalic and atraumatic.     Nose: No congestion or rhinorrhea.  Eyes:     General: No scleral icterus. Cardiovascular:     Rate and Rhythm: Normal rate  and regular rhythm.     Heart sounds: Normal heart sounds.  Pulmonary:     Effort: Pulmonary effort  is normal. No respiratory distress.     Breath sounds: Normal breath sounds.  Abdominal:     General: Bowel sounds are normal.     Palpations: Abdomen is soft. There is no hepatomegaly, splenomegaly or mass.     Tenderness: There is no abdominal tenderness. There is no guarding or rebound.     Hernia: No hernia is present.  Skin:    General: Skin is warm and dry.     Coloration: Skin is not jaundiced.     Findings: No rash.  Neurological:     General: No focal deficit present.     Mental Status: She is alert and oriented to person, place, and time.  Psychiatric:        Attention and Perception: Attention normal.        Mood and Affect: Mood normal.        Speech: Speech normal.        Behavior: Behavior normal.        Thought Content: Thought content normal.        Cognition and Memory: Cognition and memory normal.      Assessment:  Very pleasant 80 year old female presents for follow-up on IDA and history of AVMs with transfusion/iron dependent anemia.  Previous significant endoscopic history as outlined in HPI.  Most recent recommendation from [facility] included return for repeat enteroscopy with APC ablation of angiectasias, if repeat drop in ferritin or persistent GI bleed/melena.  She has already undergone this twice.  She did recently have a drop in her ferritin and hemoglobin for which she has been seeing hematology.  She is planned to receive IV iron x2 doses tomorrow.  We discussed previous recommendations and at this time she is electing to hold off on return to [facility] in preference to continued hematology, IV iron as needed, watchful waiting and monitoring.  She states her biggest obstacle is getting a ride/driver to bring her to [facility] for procedures.  No need for labs today as she has recently seen  hematology and has IV iron scheduled for tomorrow and anticipate labs posttransfusion.  She is to call us with any worsening bleeding, symptomatic anemia.  ER precautions were given.  GERD is currently doing well on Nexium, no breakthrough symptoms at this time.   Plan: 1. Keep your appointment with hematology for IV iron tomorrow 2. Call us if you have any worsening or persistent black stools, vomiting blood, symptoms of anemia such as worsening weakness, dizziness, syncope) 3. Continue other medications, especially Nexium 4. Follow-up in 3 months 5. Can consider referral back to [facility] in the future as needed.    Thank you for allowing [place] to participate in the care of [place], DNP, AGNP-C Adult & Gerontological Nurse Practitioner [facility]   12/31/2020 9:20 AM   Disclaimer: This note was dictated with voice recognition software. Similar sounding words can inadvertently be transcribed and may not be corrected upon review.

## 2021-01-01 ENCOUNTER — Inpatient Hospital Stay (HOSPITAL_COMMUNITY): Payer: 59

## 2021-01-01 VITALS — BP 105/70 | HR 68 | Temp 96.8°F | Resp 18 | Wt 151.4 lb

## 2021-01-01 DIAGNOSIS — D500 Iron deficiency anemia secondary to blood loss (chronic): Secondary | ICD-10-CM

## 2021-01-01 DIAGNOSIS — D508 Other iron deficiency anemias: Secondary | ICD-10-CM | POA: Diagnosis not present

## 2021-01-01 MED ORDER — SODIUM CHLORIDE 0.9 % IV SOLN: Freq: Once | INTRAVENOUS | Status: AC

## 2021-01-01 MED ORDER — SODIUM CHLORIDE 0.9 % IV SOLN
510.0000 mg | Freq: Once | INTRAVENOUS | Status: AC
  Administered 2021-01-01: 510 mg via INTRAVENOUS
  Filled 2021-01-01: qty 510

## 2021-01-01 NOTE — Patient Instructions (Signed)
[facility] at [facility]  Discharge Instructions:   _______________________________________________________________  Thank you for choosing [facility] at [facility] to provide your oncology and hematology care.  To afford each patient quality time with our providers, please arrive at least 15 minutes before your scheduled appointment.  You need to re-schedule your appointment if you arrive 10 or more minutes late.  We strive to give you quality time with our providers, and arriving late affects you and other patients whose appointments are after yours.  Also, if you no show three or more times for appointments you may be dismissed from the clinic.  Again, thank you for choosing [facility] at [facility]. Our hope is that these requests will allow you access to exceptional care and in a timely manner. _______________________________________________________________  If you have questions after your visit, please contact our office at [phone] between the hours of 8:30 a.m. and 5:00 p.m. Voicemails left after 4:30 p.m. will not be returned until the following business day. _______________________________________________________________  For prescription refill requests, have your pharmacy contact our office. _______________________________________________________________  Recommendations made by the consultant and any test results will be sent to your referring physician. _______________________________________________________________ 

## 2021-01-01 NOTE — Progress Notes (Signed)
Patient presents today for [name] infusion today. Vital signs stable. Patient has no complaints of any changes since her last visit. Patient has complaints of R knee arthritis.   [name] given today per MD orders. Tolerated infusion without adverse affects. Vital signs stable. No complaints at this time. Discharged from clinic ambulatory in stable condition. Alert and oriented x 3. F/U with [facility] as scheduled.

## 2021-01-04 ENCOUNTER — Other Ambulatory Visit: Payer: Self-pay

## 2021-01-04 ENCOUNTER — Encounter (HOSPITAL_COMMUNITY): Payer: Self-pay | Admitting: *Deleted

## 2021-01-04 ENCOUNTER — Emergency Department (HOSPITAL_COMMUNITY)
Admission: EM | Admit: 2021-01-04 | Discharge: 2021-01-04 | Disposition: A | Discharge status: Home / Self Care | Payer: 59 | Attending: Emergency Medicine | Admitting: Emergency Medicine

## 2021-01-04 DIAGNOSIS — R42 Dizziness and giddiness: Secondary | ICD-10-CM | POA: Insufficient documentation

## 2021-01-04 DIAGNOSIS — Z79899 Other long term (current) drug therapy: Secondary | ICD-10-CM | POA: Insufficient documentation

## 2021-01-04 DIAGNOSIS — Z8673 Personal history of transient ischemic attack (TIA), and cerebral infarction without residual deficits: Secondary | ICD-10-CM | POA: Diagnosis not present

## 2021-01-04 DIAGNOSIS — Z87891 Personal history of nicotine dependence: Secondary | ICD-10-CM | POA: Diagnosis not present

## 2021-01-04 DIAGNOSIS — I10 Essential (primary) hypertension: Secondary | ICD-10-CM | POA: Insufficient documentation

## 2021-01-04 LAB — URINALYSIS, ROUTINE W REFLEX MICROSCOPIC
Bacteria, UA: NONE SEEN
Bilirubin Urine: NEGATIVE
Glucose, UA: NEGATIVE mg/dL
Hgb urine dipstick: NEGATIVE
Ketones, ur: NEGATIVE mg/dL
Nitrite: NEGATIVE
Protein, ur: NEGATIVE mg/dL
Specific Gravity, Urine: 1.029 (ref 1.005–1.030)
pH: 5 (ref 5.0–8.0)

## 2021-01-04 LAB — BASIC METABOLIC PANEL
Anion gap: 7 (ref 5–15)
BUN: 11 mg/dL (ref 8–23)
CO2: 25 mmol/L (ref 22–32)
Calcium: 9.5 mg/dL (ref 8.9–10.3)
Chloride: 107 mmol/L (ref 98–111)
Creatinine, Ser: 0.99 mg/dL (ref 0.44–1.00)
GFR, Estimated: 57 mL/min — ABNORMAL LOW (ref >60)
Glucose, Bld: 82 mg/dL (ref 70–99)
Potassium: 4.4 mmol/L (ref 3.5–5.1)
Sodium: 139 mmol/L (ref 135–145)

## 2021-01-04 LAB — CBC
HCT: 32.9 % — ABNORMAL LOW (ref 36.0–46.0)
Hemoglobin: 9.8 g/dL — ABNORMAL LOW (ref 12.0–15.0)
MCH: 27.5 pg (ref 26.0–34.0)
MCHC: 29.8 g/dL — ABNORMAL LOW (ref 30.0–36.0)
MCV: 92.4 fL (ref 80.0–100.0)
Platelets: 350 10*3/uL (ref 150–400)
RBC: 3.56 MIL/uL — ABNORMAL LOW (ref 3.87–5.11)
RDW: 15.7 % — ABNORMAL HIGH (ref 11.5–15.5)
WBC: 5.1 10*3/uL (ref 4.0–10.5)
nRBC: 0 % (ref 0.0–0.2)

## 2021-01-04 LAB — TROPONIN I (HIGH SENSITIVITY): Troponin I (High Sensitivity): 3 ng/L (ref <18)

## 2021-01-04 LAB — CBG MONITORING, ED: Glucose-Capillary: 90 mg/dL (ref 70–99)

## 2021-01-04 NOTE — ED Provider Notes (Signed)
[facility] EMERGENCY DEPARTMENT Provider Note   CSN: [id number] Arrival date & time: 01/04/21  1310     History Chief Complaint  Patient presents with  . Dizziness    Melody Mitchell is a 80 y.o. female.  HPI   Patient with significant medical history of vertigo, hypertension, CVA presents with chief complaint of dizziness and lightheadedness.  Patient endorses that when she woke up around 7 this morning she felt all right but around 8:00am she started to feel dizzy, weak and lightheaded.  She endorses it is worse when she goes from a sitting to standing position, she states she feels off balance and has to sit down.  When she sits down the dizziness  resolves.  She denies visual changes, paresthesias or weakness in the upper or lower extremities, she denies recent head trauma is not on anticoags.  Patient endorses that she has been eating and drink without difficulty, states this is never happened to her in the past.  Patient currently states she has no complaints at this time states dizziness lightheadedness has resolved. Patient denies headaches, fevers, chills, shortness breath, chest pain, abdominal pain, nausea, vomiting, diarrhea, pedal edema.  Past Medical History:  Diagnosis Date  . Acid reflux   . Arthritis   . 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 Bronchitis january 2014  . Hiatal hernia   . High cholesterol   . History of contact dermatitis   . Hx: recurrent pneumonia   . Hypertension   . IDA (iron deficiency anemia)    chronic iron infusions  . Reflux   . Vertigo     Patient Active Problem List   Diagnosis Date Noted  . Dark stools 12/18/2019  . GNR UTI (urinary tract infection) 09/06/2019  . 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 Abnormal CT of the abdomen 02/02/2017  . Rectal bleeding 02/02/2017  . Aortic atherosclerosis ([place]) 01/03/2017  . Colitis 01/02/2017  . Hypokalemia  01/02/2017  . Heme positive stool 06/10/2015  . Change in bowel habits 06/10/2015  . Abdominal pain, epigastric 06/10/2015  . High cholesterol   . Acid reflux   . GERD 01/03/2011  . HEMORRHOIDS 04/15/2010  . Iron deficiency anemia due to chronic blood loss 01/12/2010  . 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 ABDOMINAL HYSTERECTOMY    . APPENDECTOMY    . BILATERAL BREAST BIOPSIES  BENIGN    . CHOLECYSTECTOMY    . COLONOSCOPY N/A 06/29/2015   SLF: The left colon is redundant 2. four colon polyps removed. No source  for change in bowel habits identified 3. Rectal bleeding due to large internal hemorrhoids   . COLONOSCOPY W/ BIOPSIES  6 2010   Dr. Oneida Alar: Polypoid appearing lesion of the ascending colon, 3 mm sessile rectal polyp, small internal hemorrhoids. Pathology revealed polypoid mucosa, no adenomatous changes  . Double-balloon enteroscopy, antegrade  April 2011   Dr. 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  Marland Kitchen ESOPHAGOGASTRODUODENOSCOPY  04/2009   Dr. Oneida Alar: Patent Schatzki ring dilated with advancing the scope, patchy erythema in the antrum, 2 small AVMs in the duodenal bulb, 2 additional AVMs noted in the second portion the duodenum, mild gastritis on pathology  . ESOPHAGOGASTRODUODENOSCOPY N/A 06/29/2015   SLF: 1. 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 small hiatal hernia 3. Mild non-erosive gastririts- NO obvious source for  dyspepsia identified.   . ESOPHAGOGASTRODUODENOSCOPY N/A 03/13/2020   Procedure: ESOPHAGOGASTRODUODENOSCOPY (EGD);  Surgeon: Danie Binder, MD;  Location: [facility] ENDO SUITE;  Service: Endoscopy;  Laterality: N/A;  2:15pm - pt can not come any earlier  . EYE SURGERY       OB History    Gravida  2   Para  2   Term  2   Preterm      AB      Living  1     SAB      IAB      Ectopic      Multiple      Live Births              Family  History  Problem Relation Age of Onset  . Heart failure Mother   . Diabetes Sister   . Heart attack Father   . Colon cancer Neg Hx   . Gastric cancer Neg Hx   . Esophageal cancer Neg Hx     Social History   Tobacco Use  . Smoking status: Former Smoker    Packs/day: 1.00    Years: 25.00    Pack years: 25.00    Types: Cigarettes    Quit date: 09/14/2007    Years since quitting: 13.3  . Smokeless tobacco: Never Used  Vaping Use  . Vaping Use: Never used  Substance Use Topics  . Alcohol use: Not Currently    Comment: occasional/rare Theadora Rama every now and then)  . Drug use: No    Home Medications Prior to Admission medications   Medication Sig Start Date End Date Taking? Authorizing Provider  acetaminophen (TYLENOL) 500 MG tablet Take 500 mg by mouth every 6 (six) hours as needed for mild pain or moderate pain.    [provider]  amitriptyline (ELAVIL) 25 MG tablet Take 25 mg by mouth at bedtime as needed for sleep.     [provider]  aspirin EC 325 MG tablet Take 325 mg by mouth daily. 03/20/20   [provider]  atorvastatin (LIPITOR) 40 MG tablet Take 1 tablet (40 mg total) by mouth every evening. 09/06/19 09/17/20  Roxan Hockey, MD  COMBIGAN 0.2-0.5 % ophthalmic solution Place 1 drop into the left eye every 12 (twelve) hours.  10/08/18   [provider]  escitalopram (LEXAPRO) 10 MG tablet Take 1 tablet by mouth daily. 09/04/20   [provider]  esomeprazole (NEXIUM) 40 MG capsule Take 40 mg by mouth daily before breakfast.    [provider]  ezetimibe (ZETIA) 10 MG tablet Take 10 mg by mouth in the morning.  11/11/19   [provider]  fish oil-omega-3 fatty acids 1000 MG capsule Take 1 g by mouth in the morning.     [provider]  fluticasone (FLONASE) 50 MCG/ACT nasal spray Place 2 sprays into both nostrils daily.  04/11/13   [provider]  latanoprost (XALATAN) 0.005 % ophthalmic  solution Place 1 drop into the left eye at bedtime. 09/08/20   [provider]  meclizine (ANTIVERT) 25 MG tablet Take 25 mg by mouth daily as needed. 08/07/20   [provider]  methylcellulose (ARTIFICIAL TEARS) 1 % ophthalmic solution Place 1 drop into both eyes 2 (two) times daily as needed (dry eyes).    [provider]  potassium chloride SA (KLOR-CON) 20 MEQ tablet Take 20 mEq by mouth 2 (two) times daily. 03/20/20   [provider]  traMADol Veatrice Bourbon)  50 MG tablet Take 50 mg by mouth every 6 (six) hours as needed for moderate pain or severe pain.  12/05/19   [provider]  VITAMIN B12 TR 1000 MCG TBCR Take 1 tablet by mouth in the morning.  11/11/19   [provider]  Vitamin D, Ergocalciferol, (DRISDOL) 1.25 MG (50000 UT) CAPS capsule TAKE 1 CAPSULE BY MOUTH ONCE A WEEK Patient taking differently: Take 50,000 Units by mouth every Wednesday. 11/11/19   Glennie Isle, NP-C    Allergies    Latex, Sulfa antibiotics, Other, and Pravastatin sodium  Review of Systems   Review of Systems  Constitutional: Negative for chills and fever.  HENT: Negative for congestion and sore throat.   Eyes: Negative for visual disturbance.  Respiratory: Negative for shortness of breath.   Cardiovascular: Negative for chest pain.  Gastrointestinal: Negative for abdominal pain, diarrhea, nausea and vomiting.  Genitourinary: Negative for enuresis.  Musculoskeletal: Negative for back pain.  Skin: Negative for rash.  Neurological: Positive for dizziness and weakness.  Hematological: Does not bruise/bleed easily.    Physical Exam Updated Vital Signs BP 140/88   Pulse 77   Temp 98.6 F (37 C) (Oral)   Resp (!) 22   Ht 5\' 4"  (1.626 m)   Wt 62.1 kg   SpO2 97%   BMI 23.52 kg/m   Physical Exam Vitals and nursing note reviewed.  Constitutional:      General: She is not in acute distress.    Appearance: She is not ill-appearing.  HENT:     Head:  Normocephalic and atraumatic.     Nose: No congestion.  Eyes:     Extraocular Movements: Extraocular movements intact.     Conjunctiva/sclera: Conjunctivae normal.     Pupils: Pupils are equal, round, and reactive to light.  Cardiovascular:     Rate and Rhythm: Normal rate and regular rhythm.     Pulses: Normal pulses.     Heart sounds: No murmur heard. No friction rub. No gallop.   Pulmonary:     Effort: No respiratory distress.     Breath sounds: No wheezing, rhonchi or rales.  Abdominal:     Palpations: Abdomen is soft.     Tenderness: There is no abdominal tenderness.  Musculoskeletal:     Right lower leg: No edema.     Left lower leg: No edema.     Comments: Patient is moving all 4 extremities at difficulty.  Skin:    General: Skin is warm and dry.  Neurological:     General: No focal deficit present.     Mental Status: She is alert.     GCS: GCS eye subscore is 4. GCS verbal subscore is 5. GCS motor subscore is 6.     Motor: No weakness.     Coordination: Romberg sign negative. Finger-Nose-Finger Test normal.     Comments: Cranial nerves III through XII are grossly intact.  Patient is having no difficulty with word finding.  Psychiatric:        Mood and Affect: Mood normal.     ED Results / Procedures / Treatments   Labs (all labs ordered are listed, but only abnormal results are displayed) Labs Reviewed  BASIC METABOLIC PANEL - Abnormal; Notable for the following components:      Result Value   GFR, Estimated 57 (*)    All other components within normal limits  CBC - Abnormal; Notable for the following components:   RBC 3.56 (*)  Hemoglobin 9.8 (*)    HCT 32.9 (*)    MCHC 29.8 (*)    RDW 15.7 (*)    All other components within normal limits  URINALYSIS, ROUTINE W REFLEX MICROSCOPIC - Abnormal; Notable for the following components:   APPearance HAZY (*)    Leukocytes,Ua MODERATE (*)    All other components within normal limits  CBG MONITORING, ED   TROPONIN I (HIGH SENSITIVITY)  TROPONIN I (HIGH SENSITIVITY)    EKG EKG Interpretation  Date/Time:  Monday January 04 2021 13:38:30 EST Ventricular Rate:  65 PR Interval:  144 QRS Duration: 76 QT Interval:  384 QTC Calculation: 399 R Axis:   5 Text Interpretation: Normal sinus rhythm Low voltage QRS Nonspecific T wave abnormality Abnormal ECG No significant change since last tracing 12/15/20 Confirmed by Nanda Quinton [id number] on 01/04/2021 2:00:34 PM   Radiology No results found.  Procedures Procedures   Medications Ordered in ED Medications - No data to display  ED Course  I have reviewed the triage vital signs and the nursing notes.  Pertinent labs & imaging results that were available during my care of the patient were reviewed by me and considered in my medical decision making (see chart for details).    MDM Rules/Calculators/A&P                          Initial impression-patient presents with lightheadedness and dizziness, she is alert, does not appear in acute distress, vital signs reassuring.  Will obtain basic lab work, EKG, troponin for further evaluation.  Work-up-CBC negative for leukocytosis, shows normocytic anemia appears to be a baseline for patient.  BMP unremarkable.  Troponin is 3.  UA shows moderate leukocytes, no red blood cells, no white blood cells, no bacteria present..  EKG sinus without signs of ischemia no ST elevation depression noted.  Negative orthostatic   Rule out-low suspicion for CVA or intracranial head bleed as patient denies change in vision, paresthesias or weakness to upper lower extremities, no neuro deficits noted on exam.  Low suspicion for ACS or arrhythmias as patient denies chest pain, shortness of breath, no hypoperfusion or fluid overload on exam, EKG sinus without signs of ischemia, initial troponin is 3.  Will defer second troponin as patient's been chest pain free for over 12 hours would expect elevation at this time.  Low  suspicion for systemic infection as patient is nontoxic-appearing, vital signs reassuring, no obvious source infection noted on exam.  No suspicion for UTI or pyelonephritis as urine is unremarkable.   Plan-suspect patient's this is secondary to vasovagal, will recommend continued oral hydration continue other medication follow-up PCP for further evaluation.  Vital signs have remained stable, no indication for hospital admission.  Patient discussed with attending and they agreed with assessment and plan.  Patient given at home care as well strict return precautions.  Patient verbalized that they understood agreed to said plan.   Final Clinical Impression(s) / ED Diagnoses Final diagnoses:  Dizziness    Rx / DC Orders ED Discharge Orders    None       Aron Baba 01/04/21 2152    Milton Ferguson, MD 01/05/21 1153

## 2021-01-04 NOTE — ED Triage Notes (Signed)
Pt c/o lightheadedness and dizziness that started this morning around 0730-0800. Pt reports she woke up at 0700 feeling normal. Pt reports she has generalized weakness, no unilateral weakness.

## 2021-01-04 NOTE — Discharge Instructions (Addendum)
Seen here for dizziness.  Lab work and imaging all reassuring.  I recommend continuing with your medications as prescribed.  Please stay hydrated.  When going from a sitting to standing position, do this slowly always have 3 points of contact.  Please follow-up with your primary care provider for further evaluation.  Come back to the emergency department if you develop chest pain, shortness of breath, severe abdominal pain, uncontrolled nausea, vomiting, diarrhea.

## 2021-01-04 NOTE — Progress Notes (Signed)
Cc'ed to pcp °

## 2021-01-08 ENCOUNTER — Other Ambulatory Visit: Payer: Self-pay

## 2021-01-08 ENCOUNTER — Inpatient Hospital Stay (HOSPITAL_COMMUNITY): Payer: 59

## 2021-01-08 VITALS — BP 117/80 | HR 88 | Temp 98.4°F | Resp 18 | Wt 148.6 lb

## 2021-01-08 DIAGNOSIS — D508 Other iron deficiency anemias: Secondary | ICD-10-CM | POA: Diagnosis not present

## 2021-01-08 DIAGNOSIS — D500 Iron deficiency anemia secondary to blood loss (chronic): Secondary | ICD-10-CM

## 2021-01-08 MED ORDER — SODIUM CHLORIDE 0.9 % IV SOLN: Freq: Once | INTRAVENOUS | Status: AC

## 2021-01-08 MED ORDER — SODIUM CHLORIDE 0.9 % IV SOLN
510.0000 mg | Freq: Once | INTRAVENOUS | Status: AC
  Administered 2021-01-08: 510 mg via INTRAVENOUS
  Filled 2021-01-08: qty 510

## 2021-01-08 NOTE — Progress Notes (Signed)
Tolerated infusion w/o adverse reaction.  Alert, in no distress.  VSS.  Discharged ambulatory in stable condition.  

## 2021-01-14 ENCOUNTER — Ambulatory Visit: Payer: Medicare Other | Admitting: Nurse Practitioner

## 2021-02-24 ENCOUNTER — Encounter: Payer: Self-pay | Admitting: Gastroenterology

## 2021-03-17 ENCOUNTER — Ambulatory Visit: Payer: Medicare Other | Admitting: Nurse Practitioner

## 2021-03-23 ENCOUNTER — Other Ambulatory Visit (HOSPITAL_COMMUNITY): Payer: Self-pay | Admitting: Surgery

## 2021-03-23 DIAGNOSIS — D500 Iron deficiency anemia secondary to blood loss (chronic): Secondary | ICD-10-CM

## 2021-03-23 DIAGNOSIS — E559 Vitamin D deficiency, unspecified: Secondary | ICD-10-CM

## 2021-03-24 ENCOUNTER — Other Ambulatory Visit: Payer: Self-pay

## 2021-03-24 ENCOUNTER — Inpatient Hospital Stay (HOSPITAL_COMMUNITY): Payer: Medicare Other | Attending: Hematology

## 2021-03-24 DIAGNOSIS — D500 Iron deficiency anemia secondary to blood loss (chronic): Secondary | ICD-10-CM

## 2021-03-24 DIAGNOSIS — Z833 Family history of diabetes mellitus: Secondary | ICD-10-CM | POA: Insufficient documentation

## 2021-03-24 DIAGNOSIS — Z7982 Long term (current) use of aspirin: Secondary | ICD-10-CM | POA: Insufficient documentation

## 2021-03-24 DIAGNOSIS — R5383 Other fatigue: Secondary | ICD-10-CM | POA: Insufficient documentation

## 2021-03-24 DIAGNOSIS — Z8719 Personal history of other diseases of the digestive system: Secondary | ICD-10-CM | POA: Diagnosis not present

## 2021-03-24 DIAGNOSIS — D509 Iron deficiency anemia, unspecified: Secondary | ICD-10-CM | POA: Insufficient documentation

## 2021-03-24 DIAGNOSIS — E538 Deficiency of other specified B group vitamins: Secondary | ICD-10-CM | POA: Insufficient documentation

## 2021-03-24 DIAGNOSIS — Z8249 Family history of ischemic heart disease and other diseases of the circulatory system: Secondary | ICD-10-CM | POA: Insufficient documentation

## 2021-03-24 DIAGNOSIS — K921 Melena: Secondary | ICD-10-CM | POA: Diagnosis not present

## 2021-03-24 DIAGNOSIS — Z9049 Acquired absence of other specified parts of digestive tract: Secondary | ICD-10-CM | POA: Diagnosis not present

## 2021-03-24 DIAGNOSIS — Z79899 Other long term (current) drug therapy: Secondary | ICD-10-CM | POA: Insufficient documentation

## 2021-03-24 DIAGNOSIS — Z8673 Personal history of transient ischemic attack (TIA), and cerebral infarction without residual deficits: Secondary | ICD-10-CM | POA: Insufficient documentation

## 2021-03-24 DIAGNOSIS — K5900 Constipation, unspecified: Secondary | ICD-10-CM | POA: Insufficient documentation

## 2021-03-24 DIAGNOSIS — Z882 Allergy status to sulfonamides status: Secondary | ICD-10-CM | POA: Diagnosis not present

## 2021-03-24 DIAGNOSIS — Z87891 Personal history of nicotine dependence: Secondary | ICD-10-CM | POA: Diagnosis not present

## 2021-03-24 DIAGNOSIS — M2550 Pain in unspecified joint: Secondary | ICD-10-CM | POA: Diagnosis not present

## 2021-03-24 DIAGNOSIS — E559 Vitamin D deficiency, unspecified: Secondary | ICD-10-CM | POA: Diagnosis not present

## 2021-03-24 LAB — CBC WITH DIFFERENTIAL/PLATELET
Abs Immature Granulocytes: 0.01 10*3/uL (ref 0.00–0.07)
Basophils Absolute: 0 10*3/uL (ref 0.0–0.1)
Basophils Relative: 1 %
Eosinophils Absolute: 0.1 10*3/uL (ref 0.0–0.5)
Eosinophils Relative: 3 %
HCT: 33.4 % — ABNORMAL LOW (ref 36.0–46.0)
Hemoglobin: 10.1 g/dL — ABNORMAL LOW (ref 12.0–15.0)
Immature Granulocytes: 0 %
Lymphocytes Relative: 26 %
Lymphs Abs: 1.2 10*3/uL (ref 0.7–4.0)
MCH: 29.4 pg (ref 26.0–34.0)
MCHC: 30.2 g/dL (ref 30.0–36.0)
MCV: 97.4 fL (ref 80.0–100.0)
Monocytes Absolute: 0.5 10*3/uL (ref 0.1–1.0)
Monocytes Relative: 10 %
Neutro Abs: 2.8 10*3/uL (ref 1.7–7.7)
Neutrophils Relative %: 60 %
Platelets: 322 10*3/uL (ref 150–400)
RBC: 3.43 MIL/uL — ABNORMAL LOW (ref 3.87–5.11)
RDW: 15.9 % — ABNORMAL HIGH (ref 11.5–15.5)
WBC: 4.6 10*3/uL (ref 4.0–10.5)
nRBC: 0 % (ref 0.0–0.2)

## 2021-03-24 LAB — COMPREHENSIVE METABOLIC PANEL
ALT: 13 U/L (ref 0–44)
AST: 20 U/L (ref 15–41)
Albumin: 3.9 g/dL (ref 3.5–5.0)
Alkaline Phosphatase: 54 U/L (ref 38–126)
Anion gap: 6 (ref 5–15)
BUN: 12 mg/dL (ref 8–23)
CO2: 28 mmol/L (ref 22–32)
Calcium: 9.2 mg/dL (ref 8.9–10.3)
Chloride: 105 mmol/L (ref 98–111)
Creatinine, Ser: 0.86 mg/dL (ref 0.44–1.00)
GFR, Estimated: >60 mL/min (ref >60)
Glucose, Bld: 89 mg/dL (ref 70–99)
Potassium: 4.7 mmol/L (ref 3.5–5.1)
Sodium: 139 mmol/L (ref 135–145)
Total Bilirubin: 0.4 mg/dL (ref 0.3–1.2)
Total Protein: 7.1 g/dL (ref 6.5–8.1)

## 2021-03-24 LAB — IRON AND TIBC
Iron: 38 ug/dL (ref 28–170)
Saturation Ratios: 14 % (ref 10.4–31.8)
TIBC: 275 ug/dL (ref 250–450)
UIBC: 237 ug/dL

## 2021-03-24 LAB — VITAMIN B12: Vitamin B-12: 547 pg/mL (ref 180–914)

## 2021-03-24 LAB — FERRITIN: Ferritin: 101 ng/mL (ref 11–307)

## 2021-03-26 ENCOUNTER — Other Ambulatory Visit: Payer: Self-pay

## 2021-03-26 ENCOUNTER — Inpatient Hospital Stay (HOSPITAL_BASED_OUTPATIENT_CLINIC_OR_DEPARTMENT_OTHER): Payer: Medicare Other | Admitting: Physician Assistant

## 2021-03-26 VITALS — BP 130/90 | HR 89 | Temp 97.3°F | Resp 18 | Wt 149.9 lb

## 2021-03-26 DIAGNOSIS — E559 Vitamin D deficiency, unspecified: Secondary | ICD-10-CM

## 2021-03-26 DIAGNOSIS — D500 Iron deficiency anemia secondary to blood loss (chronic): Secondary | ICD-10-CM

## 2021-03-26 DIAGNOSIS — D509 Iron deficiency anemia, unspecified: Secondary | ICD-10-CM | POA: Diagnosis not present

## 2021-03-26 NOTE — Patient Instructions (Signed)
[place] at [facility] Discharge Instructions  You were seen today by Tarri Abernethy PA-C for your anemia and iron deficinecy.  Your blood levels and iron levels looked great at this appointment.  No need for IV iron at this time.  We will check labs and see you in 3 months, but feel free to call us sooner if you have any issues in the meantime (such as severe fatigue, black/tarry stools, weakness, etc.)  LABS: Return in 3 months   FOLLOW-UP APPOINTMENT: 3 months   Thank you for choosing [place] at [facility] to provide your oncology and hematology care.  To afford each patient quality time with our provider, please arrive at least 15 minutes before your scheduled appointment time.   If you have a lab appointment with the [facility] please come in thru the Main Entrance and check in at the main information desk.  You need to re-schedule your appointment should you arrive 10 or more minutes late.  We strive to give you quality time with our providers, and arriving late affects you and other patients whose appointments are after yours.  Also, if you no show three or more times for appointments you may be dismissed from the clinic at the providers discretion.     Again, thank you for choosing [facility].  Our hope is that these requests will decrease the amount of time that you wait before being seen by our physicians.       _____________________________________________________________  Should you have questions after your visit to [facility], please contact our office at [phone] and follow the prompts.  Our office hours are 8:00 a.m. and 4:30 p.m. Monday - Friday.  Please note that voicemails left after 4:00 p.m. may not be returned until the following business day.  We are closed weekends and major holidays.  You do have access to a nurse 24-7, just call the main number to the clinic [phone] and do  not press any options, hold on the line and a nurse will answer the phone.    For prescription refill requests, have your pharmacy contact our office and allow 72 hours.    Due to Covid, you will need to wear a mask upon entering the hospital. If you do not have a mask, a mask will be given to you at the Main Entrance upon arrival. For doctor visits, patients may have 1 support person age 18 or older with them. For treatment visits, patients can not have anyone with them due to social distancing guidelines and our immunocompromised population.

## 2021-03-26 NOTE — Progress Notes (Signed)
[place], [place] [address]   CLINIC:  Medical Oncology/Hematology  PCP:  [name], MD [address] [place] [address] [phone]   REASON FOR VISIT:  Follow-up for iron deficiency anemia  CURRENT THERAPY: Intermittent IV iron infusions as needed  INTERVAL HISTORY:  Ms. Morford 80 y.o. female returns for routine follow-up of her iron deficiency anemia.  She was last seen by NP Faythe Casa on 12/29/2020.  Ms. 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  She follows with gastroenterology every 3 months, was last seen by NP Walden Field on 12/31/2020.  Ms. Saari notes dark stool, says this is chronic and intermittent.  She denies black-tarry melena or bright red blood per rectum.  No other signs or symptoms of blood loss.  She continues to take 325 mg aspirin daily due to history of mini stroke.  She reports some mild fatigue, energy is 75%.  She initially felt better after her last IV iron infusion, but has had slow progression of fatigue over the past several weeks.  She has 75% energy and 75% appetite. She endorses that she is maintaining a stable weight.    REVIEW OF SYSTEMS:  Review of Systems  Constitutional: Positive for fatigue. Negative for appetite change, chills, diaphoresis, fever and unexpected weight change.  HENT:   Negative for lump/mass and nosebleeds.   Eyes: Negative for eye problems.  Respiratory: Negative for cough, hemoptysis and shortness of breath.   Cardiovascular: Negative for chest pain, leg swelling and palpitations.  Gastrointestinal: Positive for constipation. Negative for abdominal pain, blood in stool, diarrhea, nausea and vomiting.  Genitourinary: Negative for hematuria.   Musculoskeletal: Positive for arthralgias.  Skin: Negative.   Neurological: Negative for dizziness, headaches and  light-headedness.  Hematological: Does not bruise/bleed easily.      PAST MEDICAL/SURGICAL HISTORY:  Past Medical History:  Diagnosis Date  . Acid reflux   . Arthritis   . 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 Bronchitis january 2014  . Hiatal hernia   . High cholesterol   . History of contact dermatitis   . Hx: recurrent pneumonia   . Hypertension   . IDA (iron deficiency anemia)    chronic iron infusions  . Reflux   . Vertigo    Past Surgical History:  Procedure Laterality Date  . ABDOMINAL HYSTERECTOMY    . APPENDECTOMY    . BILATERAL BREAST BIOPSIES  BENIGN    . CHOLECYSTECTOMY    . COLONOSCOPY N/A 06/29/2015   SLF: The left colon is redundant 2. four colon polyps removed. No source  for change in bowel habits identified 3. Rectal bleeding due to large internal hemorrhoids   . COLONOSCOPY W/ BIOPSIES  6 2010   Dr. Oneida Alar: Polypoid appearing lesion of the ascending colon, 3 mm sessile rectal polyp, small internal hemorrhoids. Pathology revealed polypoid mucosa, no adenomatous changes  . Double-balloon enteroscopy, antegrade  April 2011   Dr. 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  Marland Kitchen ESOPHAGOGASTRODUODENOSCOPY  04/2009   Dr. Oneida Alar: Patent Schatzki ring dilated with advancing the scope, patchy erythema in the antrum, 2 small AVMs in the duodenal bulb, 2 additional AVMs noted in the second portion the duodenum, mild gastritis on pathology  . ESOPHAGOGASTRODUODENOSCOPY N/A 06/29/2015  SLF: 1. 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 small hiatal hernia 3. Mild non-erosive gastririts- NO obvious source for dyspepsia identified.   . ESOPHAGOGASTRODUODENOSCOPY N/A 03/13/2020   Procedure: ESOPHAGOGASTRODUODENOSCOPY (EGD);  Surgeon: Danie Binder, MD;  Location: [facility];  Service: Endoscopy;  Laterality: N/A;  2:15pm - pt can not come any earlier  . EYE  SURGERY       SOCIAL HISTORY:  Social History   Socioeconomic History  . Marital status: Divorced    Spouse name: Not on file  . Number of children: 1  . Years of education: Not on file  . Highest education level: Not on file  Occupational History  . Not on file  Tobacco Use  . Smoking status: Former Smoker    Packs/day: 1.00    Years: 25.00    Pack years: 25.00    Types: Cigarettes    Quit date: 09/14/2007    Years since quitting: 13.5  . Smokeless tobacco: Never Used  Vaping Use  . Vaping Use: Never used  Substance and Sexual Activity  . Alcohol use: Not Currently    Comment: occasional/rare Theadora Rama every now and then)  . Drug use: No  . Sexual activity: Yes    Birth control/protection: Post-menopausal, Surgical  Other Topics Concern  . Not on file  Social History Narrative  . Not on file   Social Determinants of Health   Financial Resource Strain: Not on file  Food Insecurity: Not on file  Transportation Needs: Not on file  Physical Activity: Not on file  Stress: Not on file  Social Connections: Not on file  Intimate Partner Violence: Not on file    FAMILY HISTORY:  Family History  Problem Relation Age of Onset  . Heart failure Mother   . Diabetes Sister   . Heart attack Father   . Colon cancer Neg Hx   . Gastric cancer Neg Hx   . Esophageal cancer Neg Hx     CURRENT MEDICATIONS:  Outpatient Encounter Medications as of 03/26/2021  Medication Sig  . acetaminophen (TYLENOL) 500 MG tablet Take 500 mg by mouth every 6 (six) hours as needed for mild pain or moderate pain.  Marland Kitchen amitriptyline (ELAVIL) 25 MG tablet Take 25 mg by mouth at bedtime as needed for sleep.   Marland Kitchen aspirin EC 325 MG tablet Take 325 mg by mouth daily.  . COMBIGAN 0.2-0.5 % ophthalmic solution Place 1 drop into the left eye every 12 (twelve) hours.   Marland Kitchen escitalopram (LEXAPRO) 10 MG tablet Take 1 tablet by mouth daily.  Marland Kitchen esomeprazole (NEXIUM) 40 MG capsule Take 40 mg by mouth daily before  breakfast.  . ezetimibe (ZETIA) 10 MG tablet Take 10 mg by mouth in the morning.   . fish oil-omega-3 fatty acids 1000 MG capsule Take 1 g by mouth in the morning.   . fluticasone (FLONASE) 50 MCG/ACT nasal spray Place 2 sprays into both nostrils daily.   Marland Kitchen latanoprost (XALATAN) 0.005 % ophthalmic solution Place 1 drop into the left eye at bedtime.  . meclizine (ANTIVERT) 25 MG tablet Take 25 mg by mouth daily as needed.  . methylcellulose (ARTIFICIAL TEARS) 1 % ophthalmic solution Place 1 drop into both eyes 2 (two) times daily as needed (dry eyes).  . potassium chloride SA (KLOR-CON) 20 MEQ tablet Take 20 mEq by mouth 2 (two) times daily.  . traMADol (ULTRAM) 50 MG tablet Take 50 mg by mouth every 6 (six) hours as needed for moderate  pain or severe pain.   Marland Kitchen VITAMIN B12 TR 1000 MCG TBCR Take 1 tablet by mouth in the morning.   . Vitamin D, Ergocalciferol, (DRISDOL) 1.25 MG (50000 UT) CAPS capsule TAKE 1 CAPSULE BY MOUTH ONCE A WEEK (Patient taking differently: Take 50,000 Units by mouth every Wednesday.)  . atorvastatin (LIPITOR) 40 MG tablet Take 1 tablet (40 mg total) by mouth every evening.   No facility-administered encounter medications on file as of 03/26/2021.    ALLERGIES:  Allergies  Allergen Reactions  . Latex Hives, Itching and Rash  . Sulfa Antibiotics Hives and Itching  . Other Hives  . Pravastatin Sodium Other (See Comments)     PHYSICAL EXAM:  ECOG PERFORMANCE STATUS: 1 - Symptomatic but completely ambulatory  Vitals:   03/26/21 1136  BP: 130/90  Pulse: 89  Resp: 18  Temp: (!) 97.3 F (36.3 C)  SpO2: 100%   Filed Weights   03/26/21 1136  Weight: 149 lb 14.6 oz (68 kg)   Physical Exam Constitutional:      Appearance: Normal appearance.  HENT:     Head: Normocephalic and atraumatic.     Mouth/Throat:     Mouth: Mucous membranes are moist.  Eyes:     Extraocular Movements: Extraocular movements intact.     Pupils: Pupils are equal, round, and reactive  to light.  Cardiovascular:     Rate and Rhythm: Normal rate and regular rhythm.     Pulses: Normal pulses.     Heart sounds: Normal heart sounds.  Pulmonary:     Effort: Pulmonary effort is normal.     Breath sounds: Normal breath sounds.  Abdominal:     General: Bowel sounds are normal.     Palpations: Abdomen is soft.     Tenderness: There is no abdominal tenderness.  Musculoskeletal:        General: No swelling.     Right lower leg: No edema.     Left lower leg: No edema.  Lymphadenopathy:     Cervical: No cervical adenopathy.  Skin:    General: Skin is warm and dry.  Neurological:     General: No focal deficit present.     Mental Status: She is alert and oriented to person, place, and time.  Psychiatric:        Mood and Affect: Mood normal.        Behavior: Behavior normal.      LABORATORY DATA:  I have reviewed the labs as listed.  CBC    Component Value Date/Time   WBC 4.6 03/24/2021 1101   RBC 3.43 (L) 03/24/2021 1101   HGB 10.1 (L) 03/24/2021 1101   HCT 33.4 (L) 03/24/2021 1101   PLT 322 03/24/2021 1101   MCV 97.4 03/24/2021 1101   MCH 29.4 03/24/2021 1101   MCHC 30.2 03/24/2021 1101   RDW 15.9 (H) 03/24/2021 1101   LYMPHSABS 1.2 03/24/2021 1101   MONOABS 0.5 03/24/2021 1101   EOSABS 0.1 03/24/2021 1101   BASOSABS 0.0 03/24/2021 1101   CMP Latest Ref Rng & Units 03/24/2021 01/04/2021 12/22/2020  Glucose 70 - 99 mg/dL 89 82 90  BUN 8 - 23 mg/dL 12 11 12   Creatinine 0.44 - 1.00 mg/dL 0.86 0.99 0.85  Sodium 135 - 145 mmol/L 139 139 137  Potassium 3.5 - 5.1 mmol/L 4.7 4.4 4.2  Chloride 98 - 111 mmol/L 105 107 105  CO2 22 - 32 mmol/L 28 25 25   Calcium 8.9 - 10.3 mg/dL 9.2  9.5 9.3  Total Protein 6.5 - 8.1 g/dL 7.1 - 7.4  Total Bilirubin 0.3 - 1.2 mg/dL 0.4 - 0.3  Alkaline Phos 38 - 126 U/L 54 - 55  AST 15 - 41 U/L 20 - 18  ALT 0 - 44 U/L 13 - 11    DIAGNOSTIC IMAGING:  I have independently reviewed the relevant imaging and discussed with the  patient.    ASSESSMENT & PLAN:  1. Iron deficiency anemia: -This is from small bowel AVMs, as evidenced by EGD in 2011. She also had a EGD and colonoscopy in 2016 did not show any evidence of active bleeding areas, however she had large internal hemorrhoids. 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 Vitamin D deficiency: -Continue calcium and vitamin D.  3. Borderline vitamin B12 deficiency: -Labs on 03/24/2021 show vitamin B12 at 547 -Continue B12 supplements.   PLAN SUMMARY & DISPOSITION: - Labs and RTC in 3 months  All questions were answered. The patient knows to call the clinic with any problems, questions or concerns.  Medical decision making: Low  Time spent on visit: I spent 15 minutes counseling the patient face to face. The total time spent in the appointment was 25 minutes and more than 50% was on counseling.   Harriett Rush, PA-C  03/26/21 12:04 PM

## 2021-03-30 ENCOUNTER — Ambulatory Visit: Payer: 59 | Admitting: Nurse Practitioner

## 2021-03-31 ENCOUNTER — Ambulatory Visit (HOSPITAL_COMMUNITY): Payer: 59 | Admitting: Hematology

## 2021-04-06 ENCOUNTER — Ambulatory Visit (INDEPENDENT_AMBULATORY_CARE_PROVIDER_SITE_OTHER): Payer: Medicare Other | Admitting: Gastroenterology

## 2021-04-06 ENCOUNTER — Encounter: Payer: Self-pay | Admitting: Gastroenterology

## 2021-04-06 VITALS — BP 118/79 | HR 62 | Temp 96.8°F | Ht 64.0 in | Wt 150.6 lb

## 2021-04-06 DIAGNOSIS — D500 Iron deficiency anemia secondary to blood loss (chronic): Secondary | ICD-10-CM | POA: Diagnosis not present

## 2021-04-06 DIAGNOSIS — K219 Gastro-esophageal reflux disease without esophagitis: Secondary | ICD-10-CM

## 2021-04-06 NOTE — Progress Notes (Signed)
Primary Care Physician: [name], MD  Primary Gastroenterologist:  Elon Alas. Abbey Chatters, DO   Chief Complaint  Patient presents with  . Gastroesophageal Reflux  . Anemia    HPI: Melody Mitchell is a 80 y.o. female here for follow-up of GERD and anemia.  Patient last seen February 2022.  Colonoscopy 2016 with plans to repeat in 10 to 15 years if benefits outweigh risk.  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  Spot tattoo in jejunum at 90 cm from the incisors.  In 2011 she had double-balloon enteroscopy at [facility] with duodenal angiectasia status post APC ablation and angiectasia in the proximal jejunum status post APC ablation.  Follows hematology for IDA likely due to AVMs.  Received IV iron as needed last infusions in February.  She is on aspirin 325 mg daily due to history of stroke.Most recent labs from Mar 24, 2021: B12 547, hemoglobin 10.1 stable, MCV 97.4, iron 38, TIBC 275, iron saturation is up from 8% to 14%, ferritin up from 42-101.  No abdominal pain. BM regular. Denies melena, brbpr. Appetite good. No N/V. No heartburn. No dysphagia.   Current Outpatient Medications  Medication Sig Dispense Refill  . acetaminophen (TYLENOL) 500 MG tablet Take 500 mg by mouth every 6 (six) hours as needed for mild pain or moderate pain.    Marland Kitchen amitriptyline (ELAVIL) 25 MG tablet Take 25 mg by mouth at bedtime as needed for sleep.     Marland Kitchen aspirin EC 325 MG tablet Take 325 mg by mouth daily.    Marland Kitchen atorvastatin (LIPITOR) 40 MG tablet Take 1 tablet (40 mg total) by mouth every evening. 30 tablet 4  . COMBIGAN 0.2-0.5 % ophthalmic solution Place 1 drop into the left eye every 12 (twelve) hours.   99  . escitalopram (LEXAPRO) 10 MG tablet Take 1 tablet by mouth daily.    Marland Kitchen esomeprazole (NEXIUM) 40 MG capsule Take 40 mg by mouth daily before breakfast.    .  ezetimibe (ZETIA) 10 MG tablet Take 10 mg by mouth in the morning.     . fish oil-omega-3 fatty acids 1000 MG capsule Take 1 g by mouth in the morning.     . latanoprost (XALATAN) 0.005 % ophthalmic solution Place 1 drop into the left eye at bedtime.    . meclizine (ANTIVERT) 25 MG tablet Take 25 mg by mouth daily as needed.    . methylcellulose (ARTIFICIAL TEARS) 1 % ophthalmic solution Place 1 drop into both eyes 2 (two) times daily as needed (dry eyes).    . potassium chloride SA (KLOR-CON) 20 MEQ tablet Take 20 mEq by mouth 2 (two) times daily.    . traMADol (ULTRAM) 50 MG tablet Take 50 mg by mouth every 6 (six) hours as needed for moderate pain or severe pain.     Marland Kitchen VITAMIN B12 TR 1000 MCG TBCR Take 1 tablet by mouth in the morning.     . Vitamin D, Ergocalciferol, (DRISDOL) 1.25 MG (50000 UT) CAPS capsule TAKE 1 CAPSULE BY MOUTH ONCE A WEEK (Patient taking differently: Take 50,000 Units by mouth every Wednesday.) 4 capsule 11   No current facility-administered medications for this visit.    Allergies as of 04/06/2021 - Review Complete 04/06/2021  Allergen Reaction Noted  . Latex Hives, Itching, and Rash 07/01/2009  . Sulfa antibiotics Hives and Itching 11/21/2013  .  Other Hives 12/07/2020  . Pravastatin sodium Other (See Comments) 12/07/2020    ROS:  General: Negative for anorexia, weight loss, fever, chills, fatigue, weakness. ENT: Negative for hoarseness, difficulty swallowing , nasal congestion. CV: Negative for chest pain, angina, palpitations, dyspnea on exertion, peripheral edema.  Respiratory: Negative for dyspnea at rest, dyspnea on exertion, cough, sputum, wheezing.  GI: See history of present illness. GU:  Negative for dysuria, hematuria, urinary incontinence, urinary frequency, nocturnal urination.  Endo: Negative for unusual weight change.    Physical Examination:   BP 118/79   Pulse 62   Temp (!) 96.8 F (36 C) (Temporal)   Ht 5\' 4"  (1.626 m)   Wt 150 lb 9.6  oz (68.3 kg)   BMI 25.85 kg/m   General: Well-nourished, well-developed in no acute distress.  Eyes: No icterus. Mouth: masked Lungs: Clear to auscultation bilaterally.  Heart: Regular rate and rhythm, no murmurs rubs or gallops.  Abdomen: Bowel sounds are normal, nontender, nondistended, no hepatosplenomegaly or masses, no abdominal bruits or hernia , no rebound or guarding.   Extremities: No lower extremity edema. No clubbing or deformities. Neuro: Alert and oriented x 4   Skin: Warm and dry, no jaundice.   Psych: Alert and cooperative, normal mood and affect.  Labs:  Lab Results  Component Value Date   CREATININE 0.86 03/24/2021   BUN 12 03/24/2021   NA 139 03/24/2021   K 4.7 03/24/2021   CL 105 03/24/2021   CO2 28 03/24/2021   Lab Results  Component Value Date   ALT 13 03/24/2021   AST 20 03/24/2021   ALKPHOS 54 03/24/2021   BILITOT 0.4 03/24/2021     Imaging Studies: No results found.   Assessment:  80 y/o female with IDA in setting of small bowel AVMs as outlined above. Her last iron transfusions were in 12/2020. Most recent labs showed stable H/H and improved iron, ferritin. She denies overt GI bleeding. No GI symptoms. She is on ASA 325mg  daily for prior history of TIA. Takes Nexium 40mg  daily. No reflux symptoms.     Plan: 1. Continue to follow with hematology. Next appt in 06/2021. 2. Call if black or bloody stools, weakness, dizziness, etc. 3. Continue Nexium 40mg  daily.  4. If you develop transfusion dependent anemia, or increased IV iron needs (unable to maintain ferritin), would refer back to [facility] for possible repeat enteroscopy.  5. Return to the office in 3 months.

## 2021-04-06 NOTE — Patient Instructions (Addendum)
1. Call us if you have black or bloody stools, develop worsening fatigue/lightheadedness. 2. Continue to follow with Dr. Delton Coombes for iron deficiency anemia. 3. Continue Nexium (esomeprazole) 40 mg daily for acid reflux, GI protection from aspirin use. 4. Return to the office in 3 months.

## 2021-04-27 ENCOUNTER — Encounter (HOSPITAL_COMMUNITY): Payer: Self-pay

## 2021-04-27 ENCOUNTER — Emergency Department (HOSPITAL_COMMUNITY)
Admission: EM | Admit: 2021-04-27 | Discharge: 2021-04-27 | Disposition: A | Discharge status: Home / Self Care | Payer: Medicare Other | Attending: Emergency Medicine | Admitting: Emergency Medicine

## 2021-04-27 ENCOUNTER — Other Ambulatory Visit: Payer: Self-pay

## 2021-04-27 DIAGNOSIS — Z87891 Personal history of nicotine dependence: Secondary | ICD-10-CM | POA: Insufficient documentation

## 2021-04-27 DIAGNOSIS — Z79899 Other long term (current) drug therapy: Secondary | ICD-10-CM | POA: Insufficient documentation

## 2021-04-27 DIAGNOSIS — I10 Essential (primary) hypertension: Secondary | ICD-10-CM | POA: Insufficient documentation

## 2021-04-27 DIAGNOSIS — Z91040 Latex allergy status: Secondary | ICD-10-CM | POA: Diagnosis not present

## 2021-04-27 DIAGNOSIS — D649 Anemia, unspecified: Secondary | ICD-10-CM | POA: Diagnosis not present

## 2021-04-27 DIAGNOSIS — R531 Weakness: Secondary | ICD-10-CM

## 2021-04-27 DIAGNOSIS — Z7982 Long term (current) use of aspirin: Secondary | ICD-10-CM | POA: Insufficient documentation

## 2021-04-27 DIAGNOSIS — R42 Dizziness and giddiness: Secondary | ICD-10-CM | POA: Diagnosis present

## 2021-04-27 LAB — URINALYSIS, ROUTINE W REFLEX MICROSCOPIC
Bilirubin Urine: NEGATIVE
Glucose, UA: NEGATIVE mg/dL
Hgb urine dipstick: NEGATIVE
Ketones, ur: NEGATIVE mg/dL
Leukocytes,Ua: NEGATIVE
Nitrite: NEGATIVE
Protein, ur: NEGATIVE mg/dL
Specific Gravity, Urine: 1.012 (ref 1.005–1.030)
pH: 5 (ref 5.0–8.0)

## 2021-04-27 LAB — CBC WITH DIFFERENTIAL/PLATELET
Abs Immature Granulocytes: 0.01 10*3/uL (ref 0.00–0.07)
Basophils Absolute: 0 10*3/uL (ref 0.0–0.1)
Basophils Relative: 0 %
Eosinophils Absolute: 0.2 10*3/uL (ref 0.0–0.5)
Eosinophils Relative: 4 %
HCT: 28.3 % — ABNORMAL LOW (ref 36.0–46.0)
Hemoglobin: 8.7 g/dL — ABNORMAL LOW (ref 12.0–15.0)
Immature Granulocytes: 0 %
Lymphocytes Relative: 30 %
Lymphs Abs: 1.4 10*3/uL (ref 0.7–4.0)
MCH: 29.2 pg (ref 26.0–34.0)
MCHC: 30.7 g/dL (ref 30.0–36.0)
MCV: 95 fL (ref 80.0–100.0)
Monocytes Absolute: 0.5 10*3/uL (ref 0.1–1.0)
Monocytes Relative: 11 %
Neutro Abs: 2.5 10*3/uL (ref 1.7–7.7)
Neutrophils Relative %: 55 %
Platelets: 341 10*3/uL (ref 150–400)
RBC: 2.98 MIL/uL — ABNORMAL LOW (ref 3.87–5.11)
RDW: 15.1 % (ref 11.5–15.5)
WBC: 4.6 10*3/uL (ref 4.0–10.5)
nRBC: 0 % (ref 0.0–0.2)

## 2021-04-27 LAB — CBG MONITORING, ED: Glucose-Capillary: 89 mg/dL (ref 70–99)

## 2021-04-27 LAB — COMPREHENSIVE METABOLIC PANEL
ALT: 11 U/L (ref 0–44)
AST: 19 U/L (ref 15–41)
Albumin: 3.7 g/dL (ref 3.5–5.0)
Alkaline Phosphatase: 51 U/L (ref 38–126)
Anion gap: 8 (ref 5–15)
BUN: 11 mg/dL (ref 8–23)
CO2: 24 mmol/L (ref 22–32)
Calcium: 9 mg/dL (ref 8.9–10.3)
Chloride: 106 mmol/L (ref 98–111)
Creatinine, Ser: 0.94 mg/dL (ref 0.44–1.00)
GFR, Estimated: >60 mL/min (ref >60)
Glucose, Bld: 85 mg/dL (ref 70–99)
Potassium: 4.2 mmol/L (ref 3.5–5.1)
Sodium: 138 mmol/L (ref 135–145)
Total Bilirubin: 0.4 mg/dL (ref 0.3–1.2)
Total Protein: 6.8 g/dL (ref 6.5–8.1)

## 2021-04-27 MED ORDER — SODIUM CHLORIDE 0.9 % IV BOLUS
500.0000 mL | Freq: Once | INTRAVENOUS | Status: AC
  Administered 2021-04-27: 500 mL via INTRAVENOUS

## 2021-04-27 NOTE — ED Triage Notes (Signed)
Patient arrives with c/o dizziness via EMS, stated it began this morning, patient with history of vertigo.

## 2021-04-27 NOTE — ED Provider Notes (Signed)
[facility] EMERGENCY DEPARTMENT Provider Note   CSN: [id number] Arrival date & time: 04/27/21  1335     History Chief Complaint  Patient presents with   Dizziness    Melody Mitchell is a 80 y.o. female with a history as outlined below, most significant for hypertension, iron deficiency anemia requiring iron infusions, acid reflux, aortic atherosclerosis, history of CVA and history of vertigo presenting with lightheadedness which started this morning.  She states she was starting her day had gotten out of bed and walk down her hallway when she started to feel lightheaded and had to hold onto the wall to keep from falling.  She continues to feel general weakness since this event.  She has mostly rested in  her home until arrival here.  She denies headache, focal weakness, blurred vision, chest pain, palpitations, shortness of breath, nausea or vomiting or any other complaints.  She did not spend time in the heat yesterday but endorses she did not hydrate much yesterday and when she woke this morning her house felt warm despite the air conditioner being on.  She denies room spinning sensation with positional changes.  She has had no treatment prior to arrival.  The history is provided by the patient.      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   ANTEGRADE BALLOON ENTEROSCOPY W/ APC IN [facility]   Bronchitis january 2014   Hiatal hernia    High cholesterol    History of contact dermatitis    Hx: recurrent pneumonia    Hypertension    IDA (iron deficiency anemia)    chronic iron infusions   Reflux    Vertigo     Patient Active Problem List   Diagnosis Date Noted   Dark stools 12/18/2019   GNR UTI (urinary tract infection) 09/06/2019   Stroke (cerebrum) ([place]) 09/05/2019   Abnormal CT of the abdomen 02/02/2017   Rectal bleeding 02/02/2017   Aortic atherosclerosis ([place]) 01/03/2017    Colitis 01/02/2017   Hypokalemia 01/02/2017   Heme positive stool 06/10/2015   Change in bowel habits 06/10/2015   Abdominal pain, epigastric 06/10/2015   High cholesterol    Acid reflux    GERD 01/03/2011   HEMORRHOIDS 04/15/2010   Iron deficiency anemia due to chronic blood loss 01/12/2010   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 four colon polyps removed. No source  for change in bowel habits identified 3. Rectal bleeding due to large internal hemorrhoids    COLONOSCOPY W/ BIOPSIES  6 2010   Dr. Oneida Alar: Polypoid appearing lesion of the ascending colon, 3 mm sessile rectal polyp, small internal hemorrhoids. Pathology revealed polypoid mucosa, no adenomatous changes   Double-balloon enteroscopy, antegrade  April 2011   Dr. 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   ESOPHAGOGASTRODUODENOSCOPY  04/2009   Dr. Oneida Alar: Patent Schatzki ring dilated with advancing the scope, patchy erythema in the antrum, 2 small AVMs in the duodenal bulb, 2 additional AVMs noted in the second portion the duodenum, mild gastritis on pathology   ESOPHAGOGASTRODUODENOSCOPY N/A 06/29/2015   SLF: 1. stricture at  the gastro esophageal junction 2. small hiatal hernia 3. Mild non-erosive gastririts- NO obvious source for dyspepsia identified.    ESOPHAGOGASTRODUODENOSCOPY N/A 03/13/2020   Fields: Low-grade narrowing Schatzki ring, small hiatal hernia, multiple nonbleeding angiectasia's in the jejunum treated with APC.  Spot tattoo in jejunum at 90 cm from the incisors.   EYE SURGERY       OB History     Gravida  2   Para  2   Term  2   Preterm      AB      Living  1      SAB      IAB      Ectopic      Multiple      Live Births               Family History  Problem Relation Age of Onset   Heart failure Mother    Diabetes Sister    Heart attack Father    Colon cancer Neg Hx    Gastric cancer Neg Hx    Esophageal cancer Neg Hx     Social History   Tobacco Use   Smoking status: Former    Packs/day: 1.00    Years: 25.00    Pack years: 25.00    Types: Cigarettes    Quit date: 09/14/2007    Years since quitting: 13.6   Smokeless tobacco: Never  Vaping Use   Vaping Use: Never used  Substance Use Topics   Alcohol use: Not Currently    Comment: occasional/rare (Brandy every now and then)   Drug use: No    Home Medications Prior to Admission medications   Medication Sig Start Date End Date Taking? Authorizing Provider  acetaminophen (TYLENOL) 500 MG tablet Take 500 mg by mouth every 6 (six) hours as needed for mild pain or moderate pain.    [provider]  amitriptyline (ELAVIL) 25 MG tablet Take 25 mg by mouth at bedtime as needed for sleep.     [provider]  aspirin EC 325 MG tablet Take 325 mg by mouth daily. 03/20/20   [provider]  atorvastatin (LIPITOR) 40 MG tablet Take 1 tablet (40 mg total) by mouth every evening. 09/06/19 09/17/20  Roxan Hockey, MD  COMBIGAN 0.2-0.5 % ophthalmic solution Place 1 drop into the left eye every 12 (twelve) hours.  10/08/18   [provider]  escitalopram (LEXAPRO) 10 MG tablet Take 1 tablet by mouth daily. 09/04/20   [provider]  esomeprazole (NEXIUM) 40 MG capsule Take 40 mg by mouth daily before breakfast.    [provider]  ezetimibe (ZETIA) 10 MG tablet Take 10 mg by mouth in the morning.  11/11/19   [provider]  fish oil-omega-3 fatty acids 1000 MG capsule Take 1 g by mouth in the morning.     [provider]  latanoprost (XALATAN) 0.005 % ophthalmic solution Place 1 drop into the left eye at bedtime. 09/08/20   [provider]  meclizine (ANTIVERT) 25 MG tablet Take 25  mg by mouth daily as needed. 08/07/20   [provider]  methylcellulose (ARTIFICIAL TEARS) 1 % ophthalmic solution Place 1 drop into both eyes 2 (two) times daily as needed (dry eyes).    [provider]  potassium chloride SA (KLOR-CON) 20 MEQ tablet Take 20 mEq by mouth 2 (two) times daily. 03/20/20   [provider]  traMADol (ULTRAM) 50 MG tablet Take 50 mg  by mouth every 6 (six) hours as needed for moderate pain or severe pain.  12/05/19   [provider]  VITAMIN B12 TR 1000 MCG TBCR Take 1 tablet by mouth in the morning.  11/11/19   [provider]  Vitamin D, Ergocalciferol, (DRISDOL) 1.25 MG (50000 UT) CAPS capsule TAKE 1 CAPSULE BY MOUTH ONCE A WEEK Patient taking differently: Take 50,000 Units by mouth every Wednesday. 11/11/19   Glennie Isle, NP-C    Allergies    Latex, Sulfa antibiotics, Other, and Pravastatin sodium  Review of Systems   Review of Systems  Constitutional:  Negative for chills and fever.  HENT:  Negative for congestion and sore throat.   Eyes: Negative.   Respiratory:  Negative for chest tightness and shortness of breath.   Cardiovascular:  Negative for chest pain.  Gastrointestinal:  Negative for abdominal pain, nausea and vomiting.  Genitourinary: Negative.   Musculoskeletal:  Negative for arthralgias, joint swelling and neck pain.  Skin: Negative.  Negative for rash and wound.  Neurological:  Positive for light-headedness. Negative for dizziness, weakness, numbness and headaches.  Psychiatric/Behavioral: Negative.     Physical Exam Updated Vital Signs BP 122/78   Pulse 72   Temp 99 F (37.2 C) (Oral)   Resp 18   Ht 5\' 4"  (1.626 m)   Wt 64 kg   SpO2 99%   BMI 24.20 kg/m   Physical Exam Vitals and nursing note reviewed.  Constitutional:      Appearance: She is well-developed.  HENT:     Head: Normocephalic and atraumatic.  Eyes:     Conjunctiva/sclera: Conjunctivae normal.  Cardiovascular:      Rate and Rhythm: Normal rate and regular rhythm.     Heart sounds: Normal heart sounds.  Pulmonary:     Effort: Pulmonary effort is normal.     Breath sounds: Normal breath sounds. No wheezing.  Abdominal:     General: Bowel sounds are normal.     Palpations: Abdomen is soft.     Tenderness: There is no abdominal tenderness.  Musculoskeletal:        General: Normal range of motion.     Cervical back: Normal range of motion.  Skin:    General: Skin is warm and dry.  Neurological:     Mental Status: She is alert.    ED Results / Procedures / Treatments   Labs (all labs ordered are listed, but only abnormal results are displayed) Labs Reviewed  URINALYSIS, ROUTINE W REFLEX MICROSCOPIC - Abnormal; Notable for the following components:      Result Value   Color, Urine STRAW (*)    All other components within normal limits  CBC WITH DIFFERENTIAL/PLATELET - Abnormal; Notable for the following components:   RBC 2.98 (*)    Hemoglobin 8.7 (*)    HCT 28.3 (*)    All other components within normal limits  COMPREHENSIVE METABOLIC PANEL  CBG MONITORING, ED    EKG EKG Interpretation  Date/Time:  Tuesday April 27 2021 13:54:05 EDT Ventricular Rate:  82 PR Interval:  150 QRS Duration: 91 QT Interval:  388 QTC Calculation: 454 R Axis:   -11 Text Interpretation: Sinus rhythm Low voltage, precordial leads Abnormal R-wave progression, early transition Borderline T abnormalities, anterior leads Confirmed by Davonna Belling [phone]) on 04/27/2021 6:39:14 PM  Radiology No results found.  Procedures Procedures   Medications Ordered in ED Medications  sodium chloride 0.9 % bolus 500 mL (0 mLs Intravenous Stopped 04/27/21 1606)  ED Course  I have reviewed the triage vital signs and the nursing notes.  Pertinent labs & imaging results that were available during my care of the patient were reviewed by me and considered in my medical decision making (see chart for details).    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  Her hemoglobin is 8.7 today which is down from 10.1 one month ago at her last hematology appt.  She was given IV fluids here and PO as well, tolerated well and felt better with no dizziness or weakness, orthostatic tilt negative. Discussed close f/u with her hematologist as she is not interested in further tx today.  She denies any black, dark, tarry or bloody stools recently.  Strict return instructions given.  Note also sent to hematology to advise of her current hgb level.     Final Clinical Impression(s) / ED Diagnoses Final diagnoses:  Weakness  Chronic anemia    Rx / DC Orders ED Discharge Orders          Ordered    POCT occult blood stool        04/27/21 1847             Evalee Jefferson, PA-C 04/29/21 1304    Elnora Morrison, MD 05/03/21 1555

## 2021-04-27 NOTE — Discharge Instructions (Addendum)
Your hemoglobin is low today at 8.7.  Since you are feeling better, you may go home with close followup with your MD.  Keep your appointment next week, but return here immediately if you have any new or worsening symptoms.

## 2021-05-04 ENCOUNTER — Other Ambulatory Visit: Payer: Self-pay

## 2021-05-04 ENCOUNTER — Inpatient Hospital Stay (HOSPITAL_BASED_OUTPATIENT_CLINIC_OR_DEPARTMENT_OTHER): Payer: Medicare Other | Admitting: Physician Assistant

## 2021-05-04 ENCOUNTER — Inpatient Hospital Stay (HOSPITAL_COMMUNITY): Payer: Medicare Other | Attending: Hematology

## 2021-05-04 VITALS — BP 134/78 | HR 84 | Temp 97.0°F | Resp 18 | Wt 147.9 lb

## 2021-05-04 DIAGNOSIS — K922 Gastrointestinal hemorrhage, unspecified: Secondary | ICD-10-CM | POA: Diagnosis present

## 2021-05-04 DIAGNOSIS — E538 Deficiency of other specified B group vitamins: Secondary | ICD-10-CM | POA: Insufficient documentation

## 2021-05-04 DIAGNOSIS — D500 Iron deficiency anemia secondary to blood loss (chronic): Secondary | ICD-10-CM

## 2021-05-04 DIAGNOSIS — Q2730 Arteriovenous malformation, site unspecified: Secondary | ICD-10-CM | POA: Diagnosis not present

## 2021-05-04 DIAGNOSIS — E559 Vitamin D deficiency, unspecified: Secondary | ICD-10-CM | POA: Insufficient documentation

## 2021-05-04 LAB — COMPREHENSIVE METABOLIC PANEL
ALT: 13 U/L (ref 0–44)
AST: 20 U/L (ref 15–41)
Albumin: 4 g/dL (ref 3.5–5.0)
Alkaline Phosphatase: 54 U/L (ref 38–126)
Anion gap: 7 (ref 5–15)
BUN: 9 mg/dL (ref 8–23)
CO2: 22 mmol/L (ref 22–32)
Calcium: 8.9 mg/dL (ref 8.9–10.3)
Chloride: 110 mmol/L (ref 98–111)
Creatinine, Ser: 0.94 mg/dL (ref 0.44–1.00)
GFR, Estimated: >60 mL/min (ref >60)
Glucose, Bld: 107 mg/dL — ABNORMAL HIGH (ref 70–99)
Potassium: 4 mmol/L (ref 3.5–5.1)
Sodium: 139 mmol/L (ref 135–145)
Total Bilirubin: 0.4 mg/dL (ref 0.3–1.2)
Total Protein: 7.3 g/dL (ref 6.5–8.1)

## 2021-05-04 LAB — IRON AND TIBC
Iron: 23 ug/dL — ABNORMAL LOW (ref 28–170)
Saturation Ratios: 8 % — ABNORMAL LOW (ref 10.4–31.8)
TIBC: 304 ug/dL (ref 250–450)
UIBC: 281 ug/dL

## 2021-05-04 LAB — FERRITIN: Ferritin: 26 ng/mL (ref 11–307)

## 2021-05-04 LAB — CBC WITH DIFFERENTIAL/PLATELET
Abs Immature Granulocytes: 0.01 10*3/uL (ref 0.00–0.07)
Basophils Absolute: 0 10*3/uL (ref 0.0–0.1)
Basophils Relative: 0 %
Eosinophils Absolute: 0.1 10*3/uL (ref 0.0–0.5)
Eosinophils Relative: 2 %
HCT: 31.3 % — ABNORMAL LOW (ref 36.0–46.0)
Hemoglobin: 9.4 g/dL — ABNORMAL LOW (ref 12.0–15.0)
Immature Granulocytes: 0 %
Lymphocytes Relative: 21 %
Lymphs Abs: 1.1 10*3/uL (ref 0.7–4.0)
MCH: 29 pg (ref 26.0–34.0)
MCHC: 30 g/dL (ref 30.0–36.0)
MCV: 96.6 fL (ref 80.0–100.0)
Monocytes Absolute: 0.4 10*3/uL (ref 0.1–1.0)
Monocytes Relative: 8 %
Neutro Abs: 3.5 10*3/uL (ref 1.7–7.7)
Neutrophils Relative %: 69 %
Platelets: 359 10*3/uL (ref 150–400)
RBC: 3.24 MIL/uL — ABNORMAL LOW (ref 3.87–5.11)
RDW: 15.2 % (ref 11.5–15.5)
WBC: 5.2 10*3/uL (ref 4.0–10.5)
nRBC: 0 % (ref 0.0–0.2)

## 2021-05-04 LAB — RETICULOCYTES
Immature Retic Fract: 31.4 % — ABNORMAL HIGH (ref 2.3–15.9)
RBC.: 3.21 MIL/uL — ABNORMAL LOW (ref 3.87–5.11)
Retic Count, Absolute: 96.9 10*3/uL (ref 19.0–186.0)
Retic Ct Pct: 3 % (ref 0.4–3.1)

## 2021-05-04 LAB — VITAMIN B12: Vitamin B-12: 821 pg/mL (ref 180–914)

## 2021-05-04 LAB — VITAMIN D 25 HYDROXY (VIT D DEFICIENCY, FRACTURES): Vit D, 25-Hydroxy: 142.96 ng/mL — ABNORMAL HIGH (ref 30–100)

## 2021-05-04 LAB — LACTATE DEHYDROGENASE: LDH: 129 U/L (ref 98–192)

## 2021-05-04 NOTE — Patient Instructions (Signed)
[facility] at [facility] Discharge Instructions  You were seen today by Tarri Abernethy PA-C for your anemia.  Your blood and iron levels have dropped since your last visit, so we would like to give you some IV iron.    LABS: Return in 2 months for labs   MEDICATIONS: IV iron ([name]) x 2 doses  FOLLOW-UP APPOINTMENT: Office visit in 2 months   Thank you for choosing [facility] at [facility] to provide your oncology and hematology care.  To afford each patient quality time with our provider, please arrive at least 15 minutes before your scheduled appointment time.   If you have a lab appointment with the [facility] please come in thru the Main Entrance and check in at the main information desk.  You need to re-schedule your appointment should you arrive 10 or more minutes late.  We strive to give you quality time with our providers, and arriving late affects you and other patients whose appointments are after yours.  Also, if you no show three or more times for appointments you may be dismissed from the clinic at the providers discretion.     Again, thank you for choosing [facility].  Our hope is that these requests will decrease the amount of time that you wait before being seen by our physicians.       _____________________________________________________________  Should you have questions after your visit to [facility], please contact our office at [phone] and follow the prompts.  Our office hours are 8:00 a.m. and 4:30 p.m. Monday - Friday.  Please note that voicemails left after 4:00 p.m. may not be returned until the following business day.  We are closed weekends and major holidays.  You do have access to a nurse 24-7, just call the main number to the clinic [phone] and do not press any options, hold on the line and a nurse will answer the phone.    For prescription refill requests, have your  pharmacy contact our office and allow 72 hours.    Due to Covid, you will need to wear a mask upon entering the hospital. If you do not have a mask, a mask will be given to you at the Main Entrance upon arrival. For doctor visits, patients may have 1 support person age 109 or older with them. For treatment visits, patients can not have anyone with them due to social distancing guidelines and our immunocompromised population.

## 2021-05-04 NOTE — Progress Notes (Addendum)
[place], [place] [address]   CLINIC:  Medical Oncology/Hematology  PCP:  [name], MD [address] [place] [address] [phone]   REASON FOR VISIT:  Follow-up for iron deficiency anemia  CURRENT THERAPY: Intermittent IV iron infusions  INTERVAL HISTORY:  [name] 80 y.o. female returns for routine follow-up of her iron deficiency anemia.  She was recently seen in office on 03/26/2021 by Tarri Abernethy PA-C.  She returns today for follow-up after an ED visit on 04/27/2021.  She presented to the ED with complaints of weakness and lightheadedness, and was discharged home after receiving IV fluids.  She was noted during ED visit to have decreased Hgb 8.7 (compared to Hgb 10.1 at the time of her last visit).  Therefore, she was scheduled to return for hematology follow-up today.  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  She does continue to follow with gastroenterology, and was most recently seen by Neil Crouch, PA-C on 04/06/2021.  Today's visit, she reports feeling slightly better.  She has some slight residual lightheadedness and dizziness.  She has not noticed any overt bleeding - no epistaxis,hematochezia, or melena.  She is feeling a bit fatigued and reports that she wears out easily.  No chest pain or dyspnea on exertion.  She has 70% energy and 80% appetite. She endorses that she is maintaining a stable weight.    REVIEW OF SYSTEMS:  Review of Systems  Constitutional:  Positive for fatigue. Negative for appetite change, chills, diaphoresis, fever and unexpected weight change.  HENT:   Negative for lump/mass and nosebleeds.   Eyes:  Negative for eye problems.  Respiratory:  Negative for cough, hemoptysis and shortness of breath.   Cardiovascular:  Negative for chest pain, leg swelling and palpitations.  Gastrointestinal:  Negative for abdominal pain,  blood in stool, constipation, diarrhea, nausea and vomiting.  Genitourinary:  Negative for hematuria.   Musculoskeletal:  Positive for arthralgias.  Skin: Negative.   Neurological:  Positive for dizziness, headaches and light-headedness.  Hematological:  Does not bruise/bleed easily.  Psychiatric/Behavioral:  Positive for sleep disturbance.      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 four colon polyps removed. No source  for change in bowel habits identified 3. Rectal bleeding due to large internal hemorrhoids    COLONOSCOPY W/ BIOPSIES  6 2010   Dr. Oneida Alar: Polypoid appearing lesion of the ascending colon, 3 mm sessile rectal polyp, small internal hemorrhoids. Pathology revealed polypoid mucosa, no adenomatous changes   Double-balloon enteroscopy, antegrade  April 2011   Dr. 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  ESOPHAGOGASTRODUODENOSCOPY  04/2009   Dr. Oneida Alar: Patent Schatzki ring dilated with advancing the scope, patchy erythema in the antrum, 2 small AVMs in the duodenal bulb, 2 additional AVMs noted in the second portion the duodenum, mild gastritis on pathology   ESOPHAGOGASTRODUODENOSCOPY N/A 06/29/2015   SLF: 1. 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 small hiatal hernia 3. Mild non-erosive gastririts- NO obvious source  for dyspepsia identified.    ESOPHAGOGASTRODUODENOSCOPY N/A 03/13/2020   Fields: Low-grade narrowing Schatzki ring, small hiatal hernia, multiple nonbleeding angiectasia's in the jejunum treated with APC.  Spot tattoo in jejunum at 90 cm from the incisors.   EYE SURGERY       SOCIAL HISTORY:  Social History   Socioeconomic History   Marital status: Divorced    Spouse name: Not on file   Number of children: 1   Years of education: Not on file   Highest education level: Not on file  Occupational History   Not on file  Tobacco Use   Smoking status: Former    Packs/day: 1.00    Years: 25.00    Pack years: 25.00    Types: Cigarettes    Quit date: 09/14/2007    Years since quitting: 13.6   Smokeless tobacco: Never  Vaping Use   Vaping Use: Never used  Substance and Sexual Activity   Alcohol use: Not Currently    Comment: occasional/rare (Brandy every now and then)   Drug use: No   Sexual activity: Yes    Birth control/protection: Post-menopausal, Surgical  Other Topics Concern   Not on file  Social History Narrative   Not on file   Social Determinants of Health   Financial Resource Strain: Not on file  Food Insecurity: Not on file  Transportation Needs: Not on file  Physical Activity: Not on file  Stress: Not on file  Social Connections: Not on file  Intimate Partner Violence: Not on file    FAMILY HISTORY:  Family History  Problem Relation Age of Onset   Heart failure Mother    Diabetes Sister    Heart attack Father    Colon cancer Neg Hx    Gastric cancer Neg Hx    Esophageal cancer Neg Hx     CURRENT MEDICATIONS:  Outpatient Encounter Medications as of 05/04/2021  Medication Sig   acetaminophen (TYLENOL) 500 MG tablet Take 500 mg by mouth every 6 (six) hours as needed for mild pain or moderate pain.   amitriptyline (ELAVIL) 25 MG tablet Take 25 mg by mouth at bedtime as needed for sleep.    aspirin EC 325 MG tablet Take 325 mg by mouth daily.    atorvastatin (LIPITOR) 40 MG tablet Take 1 tablet (40 mg total) by mouth every evening.   COMBIGAN 0.2-0.5 % ophthalmic solution Place 1 drop into the left eye every 12 (twelve) hours.    escitalopram (LEXAPRO) 10 MG tablet Take 1 tablet by mouth daily.   esomeprazole (NEXIUM) 40 MG capsule Take 40 mg by mouth daily before breakfast.   ezetimibe (ZETIA) 10 MG tablet Take 10 mg by mouth in the morning.    fish oil-omega-3 fatty acids 1000 MG capsule Take 1 g by mouth in the morning.    latanoprost (XALATAN) 0.005 % ophthalmic solution Place 1 drop into the left eye at bedtime.   meclizine (ANTIVERT) 25 MG tablet Take 25 mg by mouth daily as needed.   methylcellulose (ARTIFICIAL TEARS) 1 % ophthalmic solution Place 1 drop into both eyes  2 (two) times daily as needed (dry eyes).   potassium chloride SA (KLOR-CON) 20 MEQ tablet Take 20 mEq by mouth 2 (two) times daily.   traMADol (ULTRAM) 50 MG tablet Take 50 mg by mouth every 6 (six) hours as needed for moderate pain or severe pain.    VITAMIN B12 TR 1000 MCG TBCR Take 1 tablet by mouth in the morning.    Vitamin D, Ergocalciferol, (DRISDOL) 1.25 MG (50000 UT) CAPS capsule TAKE 1 CAPSULE BY MOUTH ONCE A WEEK (Patient taking differently: Take 50,000 Units by mouth every Wednesday.)   No facility-administered encounter medications on file as of 05/04/2021.    ALLERGIES:  Allergies  Allergen Reactions   Latex Hives, Itching and Rash   Sulfa Antibiotics Hives and Itching   Other Hives   Pravastatin Sodium Other (See Comments)     PHYSICAL EXAM:  ECOG PERFORMANCE STATUS: 1 - Symptomatic but completely ambulatory  There were no vitals filed for this visit. There were no vitals filed for this visit. Physical Exam Constitutional:      Appearance: Normal appearance.  HENT:     Head: Normocephalic and atraumatic.     Mouth/Throat:     Mouth: Mucous membranes are moist.  Eyes:     Extraocular Movements: Extraocular movements intact.      Pupils: Pupils are equal, round, and reactive to light.  Cardiovascular:     Rate and Rhythm: Normal rate and regular rhythm.     Pulses: Normal pulses.     Heart sounds: Normal heart sounds.  Pulmonary:     Effort: Pulmonary effort is normal.     Breath sounds: Normal breath sounds.  Abdominal:     General: Bowel sounds are normal.     Palpations: Abdomen is soft.     Tenderness: There is no abdominal tenderness.  Musculoskeletal:        General: No swelling.     Right lower leg: No edema.     Left lower leg: No edema.  Lymphadenopathy:     Cervical: No cervical adenopathy.  Skin:    General: Skin is warm and dry.  Neurological:     General: No focal deficit present.     Mental Status: She is alert and oriented to person, place, and time.  Psychiatric:        Mood and Affect: Mood normal.        Behavior: Behavior normal.     LABORATORY DATA:  I have reviewed the labs as listed.  CBC    Component Value Date/Time   WBC 4.6 04/27/2021 1444   RBC 2.98 (L) 04/27/2021 1444   HGB 8.7 (L) 04/27/2021 1444   HCT 28.3 (L) 04/27/2021 1444   PLT 341 04/27/2021 1444   MCV 95.0 04/27/2021 1444   MCH 29.2 04/27/2021 1444   MCHC 30.7 04/27/2021 1444   RDW 15.1 04/27/2021 1444   LYMPHSABS 1.4 04/27/2021 1444   MONOABS 0.5 04/27/2021 1444   EOSABS 0.2 04/27/2021 1444   BASOSABS 0.0 04/27/2021 1444   CMP Latest Ref Rng & Units 04/27/2021 03/24/2021 01/04/2021  Glucose 70 - 99 mg/dL 85 89 82  BUN 8 - 23 mg/dL 11 12 11   Creatinine 0.44 - 1.00 mg/dL 0.94 0.86 0.99  Sodium 135 - 145 mmol/L 138 139 139  Potassium 3.5 - 5.1 mmol/L 4.2 4.7 4.4  Chloride 98 - 111 mmol/L 106 105 107  CO2 22 - 32 mmol/L 24 28 25   Calcium 8.9 - 10.3 mg/dL  9.0 9.2 9.5  Total Protein 6.5 - 8.1 g/dL 6.8 7.1 -  Total Bilirubin 0.3 - 1.2 mg/dL 0.4 0.4 -  Alkaline Phos 38 - 126 U/L 51 54 -  AST 15 - 41 U/L 19 20 -  ALT 0 - 44 U/L 11 13 -    DIAGNOSTIC IMAGING:  I have independently reviewed the relevant  imaging and discussed with the patient.  ASSESSMENT & PLAN:  1.  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  She also had a EGD and colonoscopy in 2016 did not show any evidence of active bleeding areas, however she had large internal hemorrhoids. 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  RTC in 2 months for repeat labs and follow-up visit.  We will follow other lab results, and will call patient with results.  Continue follow-up with GI.   2.  Vitamin D deficiency: - Vitamin D elevated at 142.96 - PLAN: Patient instructed to stop taking her prescription strength vitamin D 50,000 units weekly, switch to over-the-counter 2000 units daily.  Continue calcium.     3.  Borderline vitamin B12 deficiency: -Labs on 03/24/2021 show vitamin B12 at 547 - PLAN: Continue B12 supplements.   PLAN SUMMARY & DISPOSITION:  Recommend IV iron ([name] x2).  RTC in 2 months for repeat labs and follow-up visit.  We will follow other lab results, and will call patient with results.    All questions were answered. The patient knows to call the clinic with any problems, questions or concerns.  Medical decision making: Moderate  Time spent on visit: I spent 20 minutes  counseling the patient face to face. The total time spent in the appointment was 30 minutes and more than 50% was on counseling.   Harriett Rush, PA-C  05/04/21 2:13 PM

## 2021-05-05 LAB — KAPPA/LAMBDA LIGHT CHAINS
Kappa free light chain: 28.1 mg/L — ABNORMAL HIGH (ref 3.3–19.4)
Kappa, lambda light chain ratio: 1.7 — ABNORMAL HIGH (ref 0.26–1.65)
Lambda free light chains: 16.5 mg/L (ref 5.7–26.3)

## 2021-05-05 LAB — PROTEIN ELECTROPHORESIS, SERUM
A/G Ratio: 1.3 (ref 0.7–1.7)
Albumin ELP: 3.8 g/dL (ref 2.9–4.4)
Alpha-1-Globulin: 0.3 g/dL (ref 0.0–0.4)
Alpha-2-Globulin: 0.9 g/dL (ref 0.4–1.0)
Beta Globulin: 1.1 g/dL (ref 0.7–1.3)
Gamma Globulin: 0.8 g/dL (ref 0.4–1.8)
Globulin, Total: 3 g/dL (ref 2.2–3.9)
Total Protein ELP: 6.8 g/dL (ref 6.0–8.5)

## 2021-05-05 LAB — ERYTHROPOIETIN: Erythropoietin: 77.5 m[IU]/mL — ABNORMAL HIGH (ref 2.6–18.5)

## 2021-05-07 ENCOUNTER — Other Ambulatory Visit (HOSPITAL_COMMUNITY): Payer: Self-pay | Admitting: Physician Assistant

## 2021-05-07 ENCOUNTER — Inpatient Hospital Stay (HOSPITAL_COMMUNITY): Payer: Medicare Other

## 2021-05-07 ENCOUNTER — Encounter (HOSPITAL_COMMUNITY): Payer: Self-pay

## 2021-05-07 ENCOUNTER — Other Ambulatory Visit: Payer: Self-pay

## 2021-05-07 VITALS — BP 110/72 | HR 84 | Temp 96.9°F | Resp 18

## 2021-05-07 DIAGNOSIS — E559 Vitamin D deficiency, unspecified: Secondary | ICD-10-CM

## 2021-05-07 DIAGNOSIS — D500 Iron deficiency anemia secondary to blood loss (chronic): Secondary | ICD-10-CM | POA: Diagnosis not present

## 2021-05-07 MED ORDER — ACETAMINOPHEN 325 MG PO TABS
650.0000 mg | ORAL_TABLET | Freq: Once | ORAL | Status: AC
  Administered 2021-05-07: 650 mg via ORAL

## 2021-05-07 MED ORDER — ACETAMINOPHEN 325 MG PO TABS
ORAL_TABLET | ORAL | Status: AC
  Filled 2021-05-07: qty 2

## 2021-05-07 MED ORDER — FERUMOXYTOL INJECTION 510 MG/17 ML
510.0000 mg | Freq: Once | INTRAVENOUS | Status: AC
  Administered 2021-05-07: 510 mg via INTRAVENOUS
  Filled 2021-05-07: qty 510

## 2021-05-07 MED ORDER — LORATADINE 10 MG PO TABS
ORAL_TABLET | ORAL | Status: AC
  Filled 2021-05-07: qty 1

## 2021-05-07 MED ORDER — SODIUM CHLORIDE 0.9 % IV SOLN: Freq: Once | INTRAVENOUS | Status: AC

## 2021-05-07 MED ORDER — LORATADINE 10 MG PO TABS
10.0000 mg | ORAL_TABLET | Freq: Once | ORAL | Status: AC
  Administered 2021-05-07: 10 mg via ORAL

## 2021-05-07 NOTE — Addendum Note (Signed)
Addended by: Tarri Abernethy on: 05/07/2021 03:11 PM   Modules accepted: Orders

## 2021-05-07 NOTE — Progress Notes (Signed)
Tolerated infusion w/o adverse reaction.  Alert, in no distress.  VSS.  Pt remained stable during infusion. Discharged ambulatory in stable condition.

## 2021-05-07 NOTE — Patient Instructions (Signed)
[facility]  Discharge Instructions: Thank you for choosing [facility] to provide your oncology and hematology care.  If you have a lab appointment with the [facility], please come in thru the Main Entrance and check in at the main information desk.  Wear comfortable clothing and clothing appropriate for easy access to any Portacath or PICC line.   We strive to give you quality time with your provider. You may need to reschedule your appointment if you arrive late (15 or more minutes).  Arriving late affects you and other patients whose appointments are after yours.  Also, if you miss three or more appointments without notifying the office, you may be dismissed from the clinic at the provider's discretion.      For prescription refill requests, have your pharmacy contact our office and allow 72 hours for refills to be completed.    Today you received the following infusion:  [name]      To help prevent nausea and vomiting after your treatment, we encourage you to take your nausea medication as directed.  BELOW ARE SYMPTOMS THAT SHOULD BE REPORTED IMMEDIATELY: *FEVER GREATER THAN 100.4 F (38 C) OR HIGHER *CHILLS OR SWEATING *NAUSEA AND VOMITING THAT IS NOT CONTROLLED WITH YOUR NAUSEA MEDICATION *UNUSUAL SHORTNESS OF BREATH *UNUSUAL BRUISING OR BLEEDING *URINARY PROBLEMS (pain or burning when urinating, or frequent urination) *BOWEL PROBLEMS (unusual diarrhea, constipation, pain near the anus) TENDERNESS IN MOUTH AND THROAT WITH OR WITHOUT PRESENCE OF ULCERS (sore throat, sores in mouth, or a toothache) UNUSUAL RASH, SWELLING OR PAIN  UNUSUAL VAGINAL DISCHARGE OR ITCHING   Items with * indicate a potential emergency and should be followed up as soon as possible or go to the Emergency Department if any problems should occur.  Please show the CHEMOTHERAPY ALERT CARD or IMMUNOTHERAPY ALERT CARD at check-in to the Emergency Department and triage  nurse.  Should you have questions after your visit or need to cancel or reschedule your appointment, please contact [facility] [phone]  and follow the prompts.  Office hours are 8:00 a.m. to 4:30 p.m. Monday - Friday. Please note that voicemails left after 4:00 p.m. may not be returned until the following business day.  We are closed weekends and major holidays. You have access to a nurse at all times for urgent questions. Please call the main number to the clinic [phone] and follow the prompts.  For any non-urgent questions, you may also contact your provider using MyChart. We now offer e-Visits for anyone 33 and older to request care online for non-urgent symptoms. For details visit [email or web address].   Also download the MyChart app! Go to the app store, search "MyChart", open the app, select [facility], and log in with your MyChart username and password.  Due to Covid, a mask is required upon entering the hospital/clinic. If you do not have a mask, one will be given to you upon arrival. For doctor visits, patients may have 1 support person aged 6 or older with them. For treatment visits, patients cannot have anyone with them due to current Covid guidelines and our immunocompromised population.

## 2021-05-08 ENCOUNTER — Encounter (HOSPITAL_COMMUNITY): Payer: Self-pay

## 2021-05-08 ENCOUNTER — Other Ambulatory Visit: Payer: Self-pay

## 2021-05-08 ENCOUNTER — Emergency Department (HOSPITAL_COMMUNITY)
Admission: EM | Admit: 2021-05-08 | Discharge: 2021-05-08 | Disposition: A | Discharge status: Home / Self Care | Payer: Medicare Other | Attending: Emergency Medicine | Admitting: Emergency Medicine

## 2021-05-08 DIAGNOSIS — I10 Essential (primary) hypertension: Secondary | ICD-10-CM | POA: Diagnosis not present

## 2021-05-08 DIAGNOSIS — Z91040 Latex allergy status: Secondary | ICD-10-CM | POA: Insufficient documentation

## 2021-05-08 DIAGNOSIS — R531 Weakness: Secondary | ICD-10-CM | POA: Diagnosis present

## 2021-05-08 DIAGNOSIS — Z79899 Other long term (current) drug therapy: Secondary | ICD-10-CM | POA: Insufficient documentation

## 2021-05-08 DIAGNOSIS — D500 Iron deficiency anemia secondary to blood loss (chronic): Secondary | ICD-10-CM | POA: Diagnosis not present

## 2021-05-08 DIAGNOSIS — Z7982 Long term (current) use of aspirin: Secondary | ICD-10-CM | POA: Insufficient documentation

## 2021-05-08 DIAGNOSIS — Z87891 Personal history of nicotine dependence: Secondary | ICD-10-CM | POA: Insufficient documentation

## 2021-05-08 LAB — BASIC METABOLIC PANEL
Anion gap: 8 (ref 5–15)
BUN: 8 mg/dL (ref 8–23)
CO2: 24 mmol/L (ref 22–32)
Calcium: 9.4 mg/dL (ref 8.9–10.3)
Chloride: 108 mmol/L (ref 98–111)
Creatinine, Ser: 0.96 mg/dL (ref 0.44–1.00)
GFR, Estimated: 59 mL/min — ABNORMAL LOW (ref >60)
Glucose, Bld: 88 mg/dL (ref 70–99)
Potassium: 4.2 mmol/L (ref 3.5–5.1)
Sodium: 140 mmol/L (ref 135–145)

## 2021-05-08 LAB — CBC WITH DIFFERENTIAL/PLATELET
Abs Immature Granulocytes: 0.04 10*3/uL (ref 0.00–0.07)
Basophils Absolute: 0 10*3/uL (ref 0.0–0.1)
Basophils Relative: 0 %
Eosinophils Absolute: 0.1 10*3/uL (ref 0.0–0.5)
Eosinophils Relative: 2 %
HCT: 29.8 % — ABNORMAL LOW (ref 36.0–46.0)
Hemoglobin: 9.1 g/dL — ABNORMAL LOW (ref 12.0–15.0)
Immature Granulocytes: 1 %
Lymphocytes Relative: 16 %
Lymphs Abs: 0.8 10*3/uL (ref 0.7–4.0)
MCH: 29.4 pg (ref 26.0–34.0)
MCHC: 30.5 g/dL (ref 30.0–36.0)
MCV: 96.4 fL (ref 80.0–100.0)
Monocytes Absolute: 0.5 10*3/uL (ref 0.1–1.0)
Monocytes Relative: 10 %
Neutro Abs: 3.4 10*3/uL (ref 1.7–7.7)
Neutrophils Relative %: 71 %
Platelets: 367 10*3/uL (ref 150–400)
RBC: 3.09 MIL/uL — ABNORMAL LOW (ref 3.87–5.11)
RDW: 15.2 % (ref 11.5–15.5)
WBC: 4.7 10*3/uL (ref 4.0–10.5)
nRBC: 0 % (ref 0.0–0.2)

## 2021-05-08 LAB — POC OCCULT BLOOD, ED: Fecal Occult Bld: POSITIVE — AB

## 2021-05-08 MED ORDER — SODIUM CHLORIDE 0.9 % IV BOLUS
1000.0000 mL | Freq: Once | INTRAVENOUS | Status: AC
  Administered 2021-05-08: 1000 mL via INTRAVENOUS

## 2021-05-08 NOTE — ED Triage Notes (Signed)
Pt to er, pt states that she was at the hospital yesterday for an iron infusion, pt to er via ems, per ems pt is here for general weakness, states that she went to the bathroom this am and noticed some dark blood and felt weak.

## 2021-05-08 NOTE — ED Provider Notes (Signed)
[facility] EMERGENCY DEPARTMENT Provider Note  CSN: [id number] Arrival date & time: 05/08/21 0900    History Chief Complaint  Patient presents with   Weakness     Weakness  UNNAMED Melody Mitchell is a 80 y.o. female with history of chronic anemia from GI blood loss due to known AVMs has had iron infusions including on at Hematology yesterday. Today she noticed her stool looked darker in color, not really black but a dark brown and she was feeling a little lightheaded. She denies any abdominal pain, no CP, SOB or fever. No BRBPR.    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 four colon polyps removed. No source  for change in bowel habits identified 3. Rectal bleeding due to large internal hemorrhoids    COLONOSCOPY W/ BIOPSIES  6 2010   Dr. Oneida Alar: Polypoid appearing lesion of the ascending colon, 3 mm sessile rectal polyp, small internal hemorrhoids. Pathology revealed polypoid mucosa, no adenomatous changes   Double-balloon enteroscopy, antegrade  April 2011   Dr. 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   ESOPHAGOGASTRODUODENOSCOPY  04/2009   Dr. Oneida Alar: Patent Schatzki ring dilated with advancing the scope, patchy erythema in the antrum, 2 small AVMs in the duodenal bulb, 2 additional AVMs noted in the second portion the duodenum, mild gastritis on pathology    ESOPHAGOGASTRODUODENOSCOPY N/A 06/29/2015   SLF: 1. 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 small hiatal hernia 3. Mild non-erosive gastririts- NO obvious source for dyspepsia identified.    ESOPHAGOGASTRODUODENOSCOPY N/A 03/13/2020   Fields: Low-grade narrowing Schatzki ring, small hiatal hernia, multiple nonbleeding angiectasia's in the jejunum treated with APC.  Spot tattoo in jejunum at 90 cm from the incisors.   EYE SURGERY      Family History  Problem Relation Age of Onset   Heart failure Mother    Diabetes Sister    Heart attack Father    Colon cancer Neg Hx    Gastric cancer Neg Hx    Esophageal cancer Neg Hx     Social History   Tobacco Use   Smoking status: Former    Packs/day: 1.00    Years: 25.00    Pack years: 25.00    Types: Cigarettes    Quit date: 09/14/2007    Years since quitting: 13.6   Smokeless tobacco: Never  Vaping Use   Vaping Use: Never used  Substance Use Topics   Alcohol use: Not Currently    Comment: occasional/rare (Brandy every now and then)   Drug use: No     Home Medications Prior to Admission medications   Medication Sig Start Date End Date Taking? Authorizing Provider  acetaminophen (TYLENOL) 500 MG tablet Take 500 mg by mouth every  6 (six) hours as needed for mild pain or moderate pain.    [provider]  amitriptyline (ELAVIL) 25 MG tablet Take 25 mg by mouth at bedtime as needed for sleep.     [provider]  aspirin EC 325 MG tablet Take 325 mg by mouth daily. 03/20/20   [provider]  atorvastatin (LIPITOR) 40 MG tablet Take 1 tablet (40 mg total) by mouth every evening. 09/06/19 09/17/20  Roxan Hockey, MD  COMBIGAN 0.2-0.5 % ophthalmic solution Place 1 drop into the left eye every 12 (twelve) hours.  10/08/18   [provider]  escitalopram (LEXAPRO) 10 MG tablet Take 1 tablet by mouth daily. 09/04/20   [provider]  esomeprazole (NEXIUM) 40 MG capsule Take 40 mg by mouth  daily before breakfast.    [provider]  ezetimibe (ZETIA) 10 MG tablet Take 10 mg by mouth in the morning.  11/11/19   [provider]  fish oil-omega-3 fatty acids 1000 MG capsule Take 1 g by mouth in the morning.     [provider]  latanoprost (XALATAN) 0.005 % ophthalmic solution Place 1 drop into the left eye at bedtime. 09/08/20   [provider]  meclizine (ANTIVERT) 25 MG tablet Take 25 mg by mouth daily as needed. 08/07/20   [provider]  methylcellulose (ARTIFICIAL TEARS) 1 % ophthalmic solution Place 1 drop into both eyes 2 (two) times daily as needed (dry eyes).    [provider]  potassium chloride SA (KLOR-CON) 20 MEQ tablet Take 20 mEq by mouth 2 (two) times daily. 03/20/20   [provider]  traMADol (ULTRAM) 50 MG tablet Take 50 mg by mouth every 6 (six) hours as needed for moderate pain or severe pain.  12/05/19   [provider]  VITAMIN B12 TR 1000 MCG TBCR Take 1 tablet by mouth in the morning.  11/11/19   [provider]     Allergies    Latex, Sulfa antibiotics, Other, and Pravastatin sodium   Review of Systems   Review of Systems  Neurological:  Positive for weakness.  A comprehensive review of systems was completed and negative except as noted in HPI.    Physical Exam BP 115/82 (BP Location: Left Arm)   Pulse 96   Temp 98.7 F (37.1 C) (Oral)   Resp 17   Ht 5\' 4"  (1.626 m)   Wt 67.6 kg   SpO2 98%   BMI 25.58 kg/m   Physical Exam Vitals and nursing note reviewed.  Constitutional:      Appearance: Normal appearance.  HENT:     Head: Normocephalic and atraumatic.     Nose: Nose normal.     Mouth/Throat:     Mouth: Mucous membranes are moist.  Eyes:     Extraocular Movements: Extraocular movements intact.     Conjunctiva/sclera: Conjunctivae normal.  Cardiovascular:     Rate and Rhythm: Normal rate.  Pulmonary:     Effort: Pulmonary effort is normal.     Breath  sounds: Normal breath sounds.  Abdominal:     General: Abdomen is flat.     Palpations: Abdomen is soft.     Tenderness: There is no abdominal tenderness.  Genitourinary:    Comments: Rectal: Chaperone present, normal rectal tone, no mass, stool is dark brown, no melena or hematochezia. Hemoccult is positive.  Musculoskeletal:        General: No swelling. Normal range of motion.  Cervical back: Neck supple.  Skin:    General: Skin is warm and dry.  Neurological:     General: No focal deficit present.     Mental Status: She is alert.  Psychiatric:        Mood and Affect: Mood normal.     ED Results / Procedures / Treatments   Labs (all labs ordered are listed, but only abnormal results are displayed) Labs Reviewed  CBC WITH DIFFERENTIAL/PLATELET - Abnormal; Notable for the following components:      Result Value   RBC 3.09 (*)    Hemoglobin 9.1 (*)    HCT 29.8 (*)    All other components within normal limits  BASIC METABOLIC PANEL - Abnormal; Notable for the following components:   GFR, Estimated 59 (*)    All other components within normal limits  POC OCCULT BLOOD, ED - Abnormal; Notable for the following components:   Fecal Occult Bld POSITIVE (*)    All other components within normal limits    EKG None  Radiology No results found.  Procedures Procedures  Medications Ordered in the ED Medications  sodium chloride 0.9 % bolus 1,000 mL (0 mLs Intravenous Stopped 05/08/21 1058)     MDM Rules/Calculators/A&P MDM  Will check patient's labs to compare to recent ED visit and Hematology office visit. Stool is heme positive although no melena. This may be due to chronic GI bleeding as opposed to an acute change. Vitals are normal. She does report poor appetite so will give some IVF while awaiting lab results.   ED Course  I have reviewed the triage vital signs and the nursing notes.  Pertinent labs & imaging results that were available during my care of the  patient were reviewed by me and considered in my medical decision making (see chart for details).  Clinical Course as of 05/08/21 1059  Sat May 08, 2021  1008 CBC with mild anemia, improved from recent ED visit. About the same as hematology visit  [CS]  1019 BMP is normal.  [CS]  2706 Patient feeling better after IVF. She is not in need of transfusion. Has known chronic GI bleeding. No melena on exam today. Will d/c with close outpatient follow up. Advised to RTED for any worsening symptoms.  [CS]    Clinical Course User Index [CS] Truddie Hidden, MD    Final Clinical Impression(s) / ED Diagnoses Final diagnoses:  Chronic blood loss anemia    Rx / DC Orders ED Discharge Orders     None        Truddie Hidden, MD 05/08/21 1059

## 2021-05-13 ENCOUNTER — Other Ambulatory Visit (HOSPITAL_COMMUNITY): Payer: Medicare Other

## 2021-05-13 ENCOUNTER — Ambulatory Visit (HOSPITAL_COMMUNITY): Payer: Medicare Other | Admitting: Physician Assistant

## 2021-05-14 ENCOUNTER — Inpatient Hospital Stay (HOSPITAL_COMMUNITY): Payer: Medicare Other | Attending: Hematology

## 2021-05-14 ENCOUNTER — Encounter (HOSPITAL_COMMUNITY): Payer: Self-pay

## 2021-05-14 ENCOUNTER — Other Ambulatory Visit: Payer: Self-pay

## 2021-05-14 VITALS — BP 128/78 | HR 88 | Temp 98.1°F | Resp 18

## 2021-05-14 DIAGNOSIS — D509 Iron deficiency anemia, unspecified: Secondary | ICD-10-CM | POA: Diagnosis not present

## 2021-05-14 DIAGNOSIS — D500 Iron deficiency anemia secondary to blood loss (chronic): Secondary | ICD-10-CM

## 2021-05-14 MED ORDER — SODIUM CHLORIDE 0.9 % IV SOLN: Freq: Once | INTRAVENOUS | Status: AC

## 2021-05-14 MED ORDER — ACETAMINOPHEN 325 MG PO TABS
ORAL_TABLET | ORAL | Status: AC
  Filled 2021-05-14: qty 2

## 2021-05-14 MED ORDER — LORATADINE 10 MG PO TABS
ORAL_TABLET | ORAL | Status: AC
  Filled 2021-05-14: qty 1

## 2021-05-14 MED ORDER — ACETAMINOPHEN 325 MG PO TABS
650.0000 mg | ORAL_TABLET | Freq: Once | ORAL | Status: AC
Start: 2021-05-14 — End: 2021-05-14
  Administered 2021-05-14: 650 mg via ORAL

## 2021-05-14 MED ORDER — LORATADINE 10 MG PO TABS
10.0000 mg | ORAL_TABLET | Freq: Once | ORAL | Status: AC
  Administered 2021-05-14: 10 mg via ORAL

## 2021-05-14 MED ORDER — SODIUM CHLORIDE 0.9 % IV SOLN
510.0000 mg | Freq: Once | INTRAVENOUS | Status: AC
  Administered 2021-05-14: 510 mg via INTRAVENOUS
  Filled 2021-05-14: qty 510

## 2021-05-14 MED ORDER — MAGNESIUM SULFATE 2 GM/50ML IV SOLN
INTRAVENOUS | Status: AC
  Filled 2021-05-14: qty 50

## 2021-05-14 NOTE — Patient Instructions (Signed)
[facility]  Discharge Instructions: Thank you for choosing [facility] to provide your oncology and hematology care.  If you have a lab appointment with the [facility], please come in thru the Main Entrance and check in at the main information desk.  Wear comfortable clothing and clothing appropriate for easy access to any Portacath or PICC line.   We strive to give you quality time with your provider. You may need to reschedule your appointment if you arrive late (15 or more minutes).  Arriving late affects you and other patients whose appointments are after yours.  Also, if you miss three or more appointments without notifying the office, you may be dismissed from the clinic at the provider's discretion.      For prescription refill requests, have your pharmacy contact our office and allow 72 hours for refills to be completed.    Iron infusion [name]   To help prevent nausea and vomiting after your treatment, we encourage you to take your nausea medication as directed.  BELOW ARE SYMPTOMS THAT SHOULD BE REPORTED IMMEDIATELY: *FEVER GREATER THAN 100.4 F (38 C) OR HIGHER *CHILLS OR SWEATING *NAUSEA AND VOMITING THAT IS NOT CONTROLLED WITH YOUR NAUSEA MEDICATION *UNUSUAL SHORTNESS OF BREATH *UNUSUAL BRUISING OR BLEEDING *URINARY PROBLEMS (pain or burning when urinating, or frequent urination) *BOWEL PROBLEMS (unusual diarrhea, constipation, pain near the anus) TENDERNESS IN MOUTH AND THROAT WITH OR WITHOUT PRESENCE OF ULCERS (sore throat, sores in mouth, or a toothache) UNUSUAL RASH, SWELLING OR PAIN  UNUSUAL VAGINAL DISCHARGE OR ITCHING   Items with * indicate a potential emergency and should be followed up as soon as possible or go to the Emergency Department if any problems should occur.  Please show the CHEMOTHERAPY ALERT CARD or IMMUNOTHERAPY ALERT CARD at check-in to the Emergency Department and triage nurse.  Should you have questions after your  visit or need to cancel or reschedule your appointment, please contact [facility] [phone]  and follow the prompts.  Office hours are 8:00 a.m. to 4:30 p.m. Monday - Friday. Please note that voicemails left after 4:00 p.m. may not be returned until the following business day.  We are closed weekends and major holidays. You have access to a nurse at all times for urgent questions. Please call the main number to the clinic [phone] and follow the prompts.  For any non-urgent questions, you may also contact your provider using MyChart. We now offer e-Visits for anyone 70 and older to request care online for non-urgent symptoms. For details visit [email or web address].   Also download the MyChart app! Go to the app store, search "MyChart", open the app, select [facility], and log in with your MyChart username and password.  Due to Covid, a mask is required upon entering the hospital/clinic. If you do not have a mask, one will be given to you upon arrival. For doctor visits, patients may have 1 support person aged 83 or older with them. For treatment visits, patients cannot have anyone with them due to current Covid guidelines and our immunocompromised population.

## 2021-05-14 NOTE — Progress Notes (Signed)
Patient presents today for IV [name] infusion. Vital signs are stable. Patient denies any changes since the last iron infusion. Patient denies any complaints today. MAR reviewed and updated.

## 2021-05-14 NOTE — Progress Notes (Signed)
Treatment given today per MD orders. Tolerated [name] infusion without adverse affects. Vital signs stable. No complaints at this time. Pt stable during and after treatment. Discharged from clinic ambulatory via cane in stable condition. Alert and oriented x 3. F/U with [facility] as scheduled.

## 2021-06-18 ENCOUNTER — Other Ambulatory Visit (HOSPITAL_COMMUNITY): Payer: Medicare Other

## 2021-06-25 ENCOUNTER — Ambulatory Visit (HOSPITAL_COMMUNITY): Payer: Medicare Other | Admitting: Physician Assistant

## 2021-07-12 ENCOUNTER — Ambulatory Visit (HOSPITAL_COMMUNITY): Payer: Medicare Other | Admitting: Physician Assistant

## 2021-07-12 ENCOUNTER — Other Ambulatory Visit (HOSPITAL_COMMUNITY): Payer: Medicare Other

## 2021-07-14 NOTE — Progress Notes (Signed)
Virtual Visit via Telephone Note [facility]  I connected with Melody Mitchell  on 07/15/21  at  3:56 PM  by telephone and verified that I am speaking with the correct person using two identifiers.  Location: Patient: Home Provider: [facility]   I discussed the limitations, risks, security and privacy concerns of performing an evaluation and management service by telephone and the availability of in person appointments. I also discussed with the patient that there may be a patient responsible charge related to this service. The patient expressed understanding and agreed to proceed.   HISTORY OF PRESENT ILLNESS: Melody Mitchell 80 y.o. 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  She was last seen by Melody Mitchell on 05/04/2021.  Her last IV iron was with [name] infusion x2 on 05/07/2021 and 05/14/2021.  Ms. Polin reports that she is doing about the same.  She continues to have some mild fatigue and arthritis pains, but otherwise reports she is doing well.  She continues to have dark black stools about once per week, but denies any other sources of blood loss such as hematochezia, hematemesis, or hematuria.  No chest pain, dyspnea on exertion, syncope, palpitations, lightheadedness, dizziness, or headaches.  She reports that she has 70% energy and 100% appetite.  She reports that she is maintaining a stable weight at this time.    OBSERVATIONS/OBJECTIVE: Review of Systems  Constitutional:  Positive for malaise/fatigue (mild fatigue). Negative for chills, diaphoresis, fever and weight loss.  Respiratory:  Negative for cough and shortness of breath.   Cardiovascular:  Negative for chest pain and palpitations.  Gastrointestinal:  Positive for melena (about once a week). Negative for abdominal pain, blood in stool, nausea and vomiting.  Musculoskeletal:  Positive for joint pain.  Neurological:   Negative for dizziness and headaches.    PHYSICAL EXAM (per limitations of virtual telephone visit): The patient is alert and oriented x 3, exhibiting adequate mentation, good mood, and ability to speak in full sentences and execute sound judgement.   ASSESSMENT & PLAN: 1.  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  She also had a EGD and colonoscopy in 2016 did not show any evidence of active bleeding areas, however she had large internal hemorrhoids. 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  Repeat labs and RTC in 2 months.  2.  Vitamin D deficiency: - Vitamin D elevated at 142.96 in June 2022, was instructed to stop taking her prescription strength vitamin D 50,000 units weekly and to switch to OTC 2,000 units daily  - Vitamin D today (07/15/2021) remains elevated at 128.20 - PLAN: Patient instructed to STOP taking vitamin D.  We will restart her on the lower dose when appropriate.  Recheck vitamin D  level in 4 months (January 2023)  3.  Borderline vitamin B12 deficiency: - Labs on 05/04/2021 show vitamin B12 at 821 - She is currently taking B12 pill once daily - PLAN: Continue vitamin B12 supplement.  We will check B12 and methylmalonic acid in 4 months (January 2023).   FOLLOW UP INSTRUCTIONS: - Stop taking vitamin D -  [name] x2 - Labs in 2 months - RTC after labs    I discussed the assessment and treatment plan with the patient. The patient was provided an opportunity to ask questions and all were answered. The patient agreed with the plan and demonstrated an understanding of the instructions.   The patient was advised to call back or seek an in-person evaluation if the symptoms worsen or if the condition fails to improve as anticipated.  I provided 13 minutes of non-face-to-face time during this encounter.   Melody Rush, PA-C 07/15/21 4:08 PM

## 2021-07-15 ENCOUNTER — Encounter (HOSPITAL_COMMUNITY): Payer: Self-pay | Admitting: Physician Assistant

## 2021-07-15 ENCOUNTER — Inpatient Hospital Stay (HOSPITAL_BASED_OUTPATIENT_CLINIC_OR_DEPARTMENT_OTHER): Payer: Medicare Other | Admitting: Physician Assistant

## 2021-07-15 ENCOUNTER — Other Ambulatory Visit: Payer: Self-pay

## 2021-07-15 ENCOUNTER — Inpatient Hospital Stay (HOSPITAL_COMMUNITY): Payer: Medicare Other | Attending: Physician Assistant

## 2021-07-15 DIAGNOSIS — E539 Vitamin B deficiency, unspecified: Secondary | ICD-10-CM

## 2021-07-15 DIAGNOSIS — D509 Iron deficiency anemia, unspecified: Secondary | ICD-10-CM | POA: Insufficient documentation

## 2021-07-15 DIAGNOSIS — E559 Vitamin D deficiency, unspecified: Secondary | ICD-10-CM

## 2021-07-15 DIAGNOSIS — D500 Iron deficiency anemia secondary to blood loss (chronic): Secondary | ICD-10-CM | POA: Diagnosis not present

## 2021-07-15 LAB — CBC WITH DIFFERENTIAL/PLATELET
Abs Immature Granulocytes: 0.01 10*3/uL (ref 0.00–0.07)
Basophils Absolute: 0 10*3/uL (ref 0.0–0.1)
Basophils Relative: 0 %
Eosinophils Absolute: 0.1 10*3/uL (ref 0.0–0.5)
Eosinophils Relative: 2 %
HCT: 31.2 % — ABNORMAL LOW (ref 36.0–46.0)
Hemoglobin: 9.4 g/dL — ABNORMAL LOW (ref 12.0–15.0)
Immature Granulocytes: 0 %
Lymphocytes Relative: 20 %
Lymphs Abs: 0.9 10*3/uL (ref 0.7–4.0)
MCH: 29.9 pg (ref 26.0–34.0)
MCHC: 30.1 g/dL (ref 30.0–36.0)
MCV: 99.4 fL (ref 80.0–100.0)
Monocytes Absolute: 0.5 10*3/uL (ref 0.1–1.0)
Monocytes Relative: 10 %
Neutro Abs: 3.1 10*3/uL (ref 1.7–7.7)
Neutrophils Relative %: 68 %
Platelets: 315 10*3/uL (ref 150–400)
RBC: 3.14 MIL/uL — ABNORMAL LOW (ref 3.87–5.11)
RDW: 15.7 % — ABNORMAL HIGH (ref 11.5–15.5)
WBC: 4.6 10*3/uL (ref 4.0–10.5)
nRBC: 0 % (ref 0.0–0.2)

## 2021-07-15 LAB — VITAMIN D 25 HYDROXY (VIT D DEFICIENCY, FRACTURES): Vit D, 25-Hydroxy: 128.2 ng/mL — ABNORMAL HIGH (ref 30–100)

## 2021-07-15 LAB — IRON AND TIBC
Iron: 37 ug/dL (ref 28–170)
Saturation Ratios: 13 % (ref 10.4–31.8)
TIBC: 285 ug/dL (ref 250–450)
UIBC: 248 ug/dL

## 2021-07-15 LAB — FERRITIN: Ferritin: 47 ng/mL (ref 11–307)

## 2021-07-22 ENCOUNTER — Inpatient Hospital Stay (HOSPITAL_COMMUNITY): Payer: Medicare Other

## 2021-07-22 ENCOUNTER — Other Ambulatory Visit: Payer: Self-pay

## 2021-07-22 VITALS — BP 104/63 | HR 79 | Temp 96.7°F | Resp 18

## 2021-07-22 DIAGNOSIS — D500 Iron deficiency anemia secondary to blood loss (chronic): Secondary | ICD-10-CM

## 2021-07-22 DIAGNOSIS — D509 Iron deficiency anemia, unspecified: Secondary | ICD-10-CM | POA: Diagnosis not present

## 2021-07-22 MED ORDER — ACETAMINOPHEN 325 MG PO TABS
650.0000 mg | ORAL_TABLET | Freq: Once | ORAL | Status: AC
  Administered 2021-07-22: 650 mg via ORAL
  Filled 2021-07-22: qty 2

## 2021-07-22 MED ORDER — SODIUM CHLORIDE 0.9 % IV SOLN: Freq: Once | INTRAVENOUS | Status: AC

## 2021-07-22 MED ORDER — LORATADINE 10 MG PO TABS
10.0000 mg | ORAL_TABLET | Freq: Once | ORAL | Status: AC
  Administered 2021-07-22: 10 mg via ORAL
  Filled 2021-07-22: qty 1

## 2021-07-22 MED ORDER — SODIUM CHLORIDE 0.9 % IV SOLN
510.0000 mg | Freq: Once | INTRAVENOUS | Status: AC
  Administered 2021-07-22: 510 mg via INTRAVENOUS
  Filled 2021-07-22: qty 510

## 2021-07-22 NOTE — Patient Instructions (Signed)
[facility]  Discharge Instructions: °Thank you for choosing [facility] to provide your oncology and hematology care.  °If you have a lab appointment with the Cancer Center, please come in thru the Main Entrance and check in at the main information desk. ° °Wear comfortable clothing and clothing appropriate for easy access to any Portacath or PICC line.  ° °We strive to give you quality time with your provider. You may need to reschedule your appointment if you arrive late (15 or more minutes).  Arriving late affects you and other patients whose appointments are after yours.  Also, if you miss three or more appointments without notifying the office, you may be dismissed from the clinic at the provider’s discretion.    °  °For prescription refill requests, have your pharmacy contact our office and allow 72 hours for refills to be completed.   ° °Today you received the following chemotherapy and/or immunotherapy agents [name]    °  °To help prevent nausea and vomiting after your treatment, we encourage you to take your nausea medication as directed. ° °BELOW ARE SYMPTOMS THAT SHOULD BE REPORTED IMMEDIATELY: °*FEVER GREATER THAN 100.4 F (38 °C) OR HIGHER °*CHILLS OR SWEATING °*NAUSEA AND VOMITING THAT IS NOT CONTROLLED WITH YOUR NAUSEA MEDICATION °*UNUSUAL SHORTNESS OF BREATH °*UNUSUAL BRUISING OR BLEEDING °*URINARY PROBLEMS (pain or burning when urinating, or frequent urination) °*BOWEL PROBLEMS (unusual diarrhea, constipation, pain near the anus) °TENDERNESS IN MOUTH AND THROAT WITH OR WITHOUT PRESENCE OF ULCERS (sore throat, sores in mouth, or a toothache) °UNUSUAL RASH, SWELLING OR PAIN  °UNUSUAL VAGINAL DISCHARGE OR ITCHING  ° °Items with * indicate a potential emergency and should be followed up as soon as possible or go to the Emergency Department if any problems should occur. ° °Please show the CHEMOTHERAPY ALERT CARD or IMMUNOTHERAPY ALERT CARD at check-in to the Emergency  Department and triage nurse. ° °Should you have questions after your visit or need to cancel or reschedule your appointment, please contact [facility] [phone]  and follow the prompts.  Office hours are 8:00 a.m. to 4:30 p.m. Monday - Friday. Please note that voicemails left after 4:00 p.m. may not be returned until the following business day.  We are closed weekends and major holidays. You have access to a nurse at all times for urgent questions. Please call the main number to the clinic [phone] and follow the prompts. ° °For any non-urgent questions, you may also contact your provider using MyChart. We now offer e-Visits for anyone 18 and older to request care online for non-urgent symptoms. For details visit [email or web address]. °  °Also download the MyChart app! Go to the app store, search "MyChart", open the app, select [facility], and log in with your MyChart username and password. ° °Due to Covid, a mask is required upon entering the hospital/clinic. If you do not have a mask, one will be given to you upon arrival. For doctor visits, patients may have 1 support person aged 18 or older with them. For treatment visits, patients cannot have anyone with them due to current Covid guidelines and our immunocompromised population.  °

## 2021-07-22 NOTE — Progress Notes (Signed)
Patient presents today for [name] infusion per providers order.  Vital signs WNL.  Patient has no new complaints at this time.    Peripheral IV started and blood return noted pre and post infusion.  [name] infusion  given today per MD orders.  Stable during infusion without adverse affects.  Vital signs stable.  No complaints at this time.  Discharge from clinic ambulatory in stable condition.  Alert and oriented X 3.  Follow up with  Cancer Center as scheduled.  

## 2021-07-29 ENCOUNTER — Encounter (HOSPITAL_COMMUNITY): Payer: Self-pay

## 2021-07-29 ENCOUNTER — Inpatient Hospital Stay (HOSPITAL_COMMUNITY): Payer: Medicare Other

## 2021-07-29 VITALS — BP 119/72 | HR 66 | Temp 98.3°F | Resp 18

## 2021-07-29 DIAGNOSIS — D500 Iron deficiency anemia secondary to blood loss (chronic): Secondary | ICD-10-CM

## 2021-07-29 DIAGNOSIS — D509 Iron deficiency anemia, unspecified: Secondary | ICD-10-CM | POA: Diagnosis not present

## 2021-07-29 MED ORDER — SODIUM CHLORIDE 0.9 % IV SOLN
510.0000 mg | Freq: Once | INTRAVENOUS | Status: AC
  Administered 2021-07-29: 510 mg via INTRAVENOUS
  Filled 2021-07-29: qty 510

## 2021-07-29 MED ORDER — LORATADINE 10 MG PO TABS
10.0000 mg | ORAL_TABLET | Freq: Once | ORAL | Status: AC
  Administered 2021-07-29: 10 mg via ORAL
  Filled 2021-07-29: qty 1

## 2021-07-29 MED ORDER — ACETAMINOPHEN 325 MG PO TABS
650.0000 mg | ORAL_TABLET | Freq: Once | ORAL | Status: AC
  Administered 2021-07-29: 650 mg via ORAL
  Filled 2021-07-29: qty 2

## 2021-07-29 MED ORDER — SODIUM CHLORIDE 0.9 % IV SOLN: Freq: Once | INTRAVENOUS | Status: AC

## 2021-07-29 NOTE — Progress Notes (Signed)
Pt presents today for [name] IV iron infusion. Vital signs stable and pt voiced no new complaints at this time.  Peripheral IV started with good blood return pre and post infusion.  [name] given today per MD orders. Tolerated infusion without adverse affects. Vital signs stable. No complaints at this time. Discharged from clinic ambulatory cane in stable condition. Alert and oriented x 3. F/U with [facility] as scheduled.

## 2021-07-29 NOTE — Patient Instructions (Signed)
[facility]  Discharge Instructions: Thank you for choosing [facility] to provide your oncology and hematology care.  If you have a lab appointment with the Cancer Center, please come in thru the Main Entrance and check in at the main information desk.  Wear comfortable clothing and clothing appropriate for easy access to any Portacath or PICC line.   We strive to give you quality time with your provider. You may need to reschedule your appointment if you arrive late (15 or more minutes).  Arriving late affects you and other patients whose appointments are after yours.  Also, if you miss three or more appointments without notifying the office, you may be dismissed from the clinic at the provider's discretion.      For prescription refill requests, have your pharmacy contact our office and allow 72 hours for refills to be completed.    Today you received [name] IV iron infusion.     BELOW ARE SYMPTOMS THAT SHOULD BE REPORTED IMMEDIATELY: *FEVER GREATER THAN 100.4 F (38 C) OR HIGHER *CHILLS OR SWEATING *NAUSEA AND VOMITING THAT IS NOT CONTROLLED WITH YOUR NAUSEA MEDICATION *UNUSUAL SHORTNESS OF BREATH *UNUSUAL BRUISING OR BLEEDING *URINARY PROBLEMS (pain or burning when urinating, or frequent urination) *BOWEL PROBLEMS (unusual diarrhea, constipation, pain near the anus) TENDERNESS IN MOUTH AND THROAT WITH OR WITHOUT PRESENCE OF ULCERS (sore throat, sores in mouth, or a toothache) UNUSUAL RASH, SWELLING OR PAIN  UNUSUAL VAGINAL DISCHARGE OR ITCHING   Items with * indicate a potential emergency and should be followed up as soon as possible or go to the Emergency Department if any problems should occur.  Please show the CHEMOTHERAPY ALERT CARD or IMMUNOTHERAPY ALERT CARD at check-in to the Emergency Department and triage nurse.  Should you have questions after your visit or need to cancel or reschedule your appointment, please contact [facility]  [phone]  and follow the prompts.  Office hours are 8:00 a.m. to 4:30 p.m. Monday - Friday. Please note that voicemails left after 4:00 p.m. may not be returned until the following business day.  We are closed weekends and major holidays. You have access to a nurse at all times for urgent questions. Please call the main number to the clinic [phone] and follow the prompts.  For any non-urgent questions, you may also contact your provider using MyChart. We now offer e-Visits for anyone 18 and older to request care online for non-urgent symptoms. For details visit [email or web address].   Also download the MyChart app! Go to the app store, search "MyChart", open the app, select [facility], and log in with your MyChart username and password.  Due to Covid, a mask is required upon entering the hospital/clinic. If you do not have a mask, one will be given to you upon arrival. For doctor visits, patients may have 1 support person aged 18 or older with them. For treatment visits, patients cannot have anyone with them due to current Covid guidelines and our immunocompromised population.  

## 2021-08-10 ENCOUNTER — Ambulatory Visit (INDEPENDENT_AMBULATORY_CARE_PROVIDER_SITE_OTHER): Payer: Medicare Other | Admitting: Gastroenterology

## 2021-08-10 ENCOUNTER — Encounter: Payer: Self-pay | Admitting: Gastroenterology

## 2021-08-10 ENCOUNTER — Other Ambulatory Visit: Payer: Self-pay

## 2021-08-10 VITALS — BP 122/80 | HR 86 | Temp 97.4°F | Ht 64.0 in | Wt 147.4 lb

## 2021-08-10 DIAGNOSIS — K219 Gastro-esophageal reflux disease without esophagitis: Secondary | ICD-10-CM | POA: Diagnosis not present

## 2021-08-10 DIAGNOSIS — D500 Iron deficiency anemia secondary to blood loss (chronic): Secondary | ICD-10-CM | POA: Diagnosis not present

## 2021-08-10 NOTE — Patient Instructions (Signed)
Continue to follow with hematology for your anemia.  I will discuss your case with Dr. Abbey Chatters to determine if updating colonoscopy is needed at this time.

## 2021-08-10 NOTE — Progress Notes (Signed)
Primary Care Physician: [name], MD  Primary Gastroenterologist:  Elon Alas. Abbey Chatters, DO   Chief Complaint  Patient presents with   Follow-up    HPI: Melody Mitchell is a 80 y.o. female here for f/u GERD and anemia. Last seen 03/2021.   Continues to follow with hematology for IDA likely due to AVMs.  Labs from July 15, 2021: Hemoglobin 9.4, hematocrit 31.2, MCV 99.4, ferritin 47, iron 37, TIBC 285, iron saturations 13%.  Received IV [name] x2 this month.  Plans for repeat labs in 2 months.  Colonoscopy 2016 with plans to repeat in 10 to 15 years if benefits outweigh risk.  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  Spot tattoo in jejunum at 90 cm from the incisors.  In 2011 she had double-balloon enteroscopy at [facility] with duodenal angiectasia status post APC ablation and angiectasia in the proximal jejunum status post APC ablation.  Clinically doing ok from GI standpoint.  She denies any bowel concerns.  No black or bloody stools.  Denies abdominal pain.  No heartburn on Nexium.  No dysphagia.  No unintentional weight loss.  She has received iron infusions in February June and September of this year, 6 total.    Current Outpatient Medications  Medication Sig Dispense Refill   acetaminophen (TYLENOL) 500 MG tablet Take 500 mg by mouth every 6 (six) hours as needed for mild pain or moderate pain.     amitriptyline (ELAVIL) 25 MG tablet Take 25 mg by mouth at bedtime as needed for sleep.      aspirin EC 325 MG tablet Take 325 mg by mouth daily.     escitalopram (LEXAPRO) 10 MG tablet Take 1 tablet by mouth daily.     esomeprazole (NEXIUM) 40 MG capsule Take 40 mg by mouth daily before breakfast.     ezetimibe (ZETIA) 10 MG tablet Take 10 mg by mouth in the morning.      fish oil-omega-3 fatty acids 1000 MG capsule Take 1 g by mouth in the  morning.      meclizine (ANTIVERT) 25 MG tablet Take 25 mg by mouth daily as needed.     potassium chloride SA (KLOR-CON) 20 MEQ tablet Take 20 mEq by mouth 2 (two) times daily.     VITAMIN B12 TR 1000 MCG TBCR Take 1 tablet by mouth in the morning.      Vitamin D, Ergocalciferol, (DRISDOL) 1.25 MG (50000 UNIT) CAPS capsule Take by mouth.     atorvastatin (LIPITOR) 40 MG tablet Take 1 tablet (40 mg total) by mouth every evening. 30 tablet 4   No current facility-administered medications for this visit.    Allergies as of 08/10/2021 - Review Complete 08/10/2021  Allergen Reaction Noted   Latex Hives, Itching, and Rash 07/01/2009   Sulfa antibiotics Hives and Itching 11/21/2013   Other Hives 12/07/2020   Pravastatin sodium Other (See Comments) 12/07/2020    ROS:  General: Negative for anorexia, weight loss, fever, chills, fatigue, weakness. ENT: Negative for hoarseness, difficulty swallowing , nasal congestion. CV: Negative for chest pain, angina, palpitations, dyspnea on exertion, peripheral edema.  Respiratory: Negative for dyspnea at rest, dyspnea on exertion, cough, sputum, wheezing.  GI: See history of present illness. GU:  Negative for dysuria, hematuria, urinary incontinence, urinary frequency, nocturnal urination.  Endo: Negative for unusual weight change.    Physical Examination:  BP 122/80   Pulse 86   Temp (!) 97.4 F (36.3 C) (Temporal)   Ht 5\' 4"  (1.626 m)   Wt 147 lb 6.4 oz (66.9 kg)   BMI 25.30 kg/m   General: Well-nourished, well-developed in no acute distress.  Eyes: No icterus. Mouth: masked Lungs: Clear to auscultation bilaterally.  Heart: Regular rate and rhythm, no murmurs rubs or gallops.  Abdomen: Bowel sounds are normal, nontender, nondistended, no hepatosplenomegaly or masses, no abdominal bruits or hernia , no rebound or guarding.   Extremities: No lower extremity edema. No clubbing or deformities. Neuro: Alert and oriented x 4   Skin: Warm and  dry, no jaundice.   Psych: Alert and cooperative, normal mood and affect.  Labs:  Lab Results  Component Value Date   WBC 4.6 07/15/2021   HGB 9.4 (L) 07/15/2021   HCT 31.2 (L) 07/15/2021   MCV 99.4 07/15/2021   PLT 315 07/15/2021   Lab Results  Component Value Date   IRON 37 07/15/2021   TIBC 285 07/15/2021   FERRITIN 47 07/15/2021   Lab Results  Component Value Date   FOLATE 8.4 03/27/2020   Lab Results  Component Value Date   AYTKZSWF09 323 05/04/2021     Imaging Studies: No results found.   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  Overall her H&H has been stable in the 9 range.  She has received 6 iron infusions so far this year.  Continues to follow routinely with hematology.  Denies overt GI bleeding.  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  Her last colonoscopy was in 2016.  She may benefit from updating colonoscopy, to discuss with Dr. Abbey Chatters.  GERD, doing well on Nexium.   Plan: Office visit in six months.  Continue to follow with hematology for IDA.  To discuss with Dr. Abbey Chatters, she may benefit from updating colonoscopy to rule out any additional sources for her IDA.

## 2021-09-13 NOTE — Progress Notes (Signed)
[place], [place] [address]   CLINIC:  Medical Oncology/Hematology  PCP:  [name], MD [address] [place] [address] [phone]   REASON FOR VISIT:  Follow-up for iron deficiency anemia  CURRENT THERAPY: Intermittent IV iron infusions (most recently 07/22/2021 & 07/29/2021)  INTERVAL HISTORY:  [name] 80 y.o. female returns for routine follow-up of her iron deficiency anemia.  She was last evaluated via telemedicine visit by Tarri Abernethy PA-C on 07/15/2021.  Her last IV iron was [name] x2 on 07/22/2021 and 07/29/2021.  At today's visit, she reports feeling fairly well.  No recent hospitalizations, surgeries, or changes in baseline health status.  She has a history of AVMs and chronic GI blood loss, and continues to report dark stools about once per week.  She denies any other bleeding episodes such as epistaxis, hematemesis, hematuria, or hematochezia.  She has chronic fatigue, with energy about 50%, which she reports is her baseline.  No pica, restless legs, chest pain, dyspnea on exertion, lightheadedness, or syncope.  She has 50% energy and 60% appetite. She endorses that she is maintaining a stable weight.   REVIEW OF SYSTEMS:  Review of Systems  Constitutional:  Positive for fatigue. Negative for appetite change, chills, diaphoresis, fever and unexpected weight change.  HENT:   Negative for lump/mass and nosebleeds.   Eyes:  Negative for eye problems.  Respiratory:  Negative for cough, hemoptysis and shortness of breath.   Cardiovascular:  Negative for chest pain, leg swelling and palpitations.  Gastrointestinal:  Negative for abdominal pain, blood in stool, constipation, diarrhea, nausea and vomiting.  Genitourinary:  Negative for hematuria.   Musculoskeletal:  Positive for arthralgias (right knee arthritis).  Skin: Negative.   Neurological:  Negative for dizziness, headaches and light-headedness.  Hematological:   Does not bruise/bleed easily.     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 four colon polyps removed. No source  for change in bowel habits identified 3. Rectal bleeding due to large internal hemorrhoids    COLONOSCOPY W/ BIOPSIES  6 2010   Dr. Oneida Alar: Polypoid appearing lesion of the ascending colon, 3 mm sessile rectal polyp, small internal hemorrhoids. Pathology revealed polypoid mucosa, no adenomatous changes   Double-balloon enteroscopy, antegrade  April 2011   Dr. 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   ESOPHAGOGASTRODUODENOSCOPY  04/2009   Dr. Oneida Alar: Patent Schatzki ring dilated with advancing the scope, patchy erythema in the antrum, 2 small AVMs in the duodenal bulb, 2 additional AVMs noted in the second portion the duodenum, mild gastritis on pathology   ESOPHAGOGASTRODUODENOSCOPY N/A 06/29/2015   SLF: 1. 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 small hiatal  hernia 3. Mild non-erosive gastririts- NO obvious source for dyspepsia identified.    ESOPHAGOGASTRODUODENOSCOPY N/A 03/13/2020   Fields: Low-grade narrowing Schatzki ring, small hiatal hernia, multiple nonbleeding angiectasia's in the jejunum treated with APC.  Spot tattoo in jejunum at 90 cm from the incisors.   EYE SURGERY       SOCIAL HISTORY:  Social History    Socioeconomic History   Marital status: Divorced    Spouse name: Not on file   Number of children: 1   Years of education: Not on file   Highest education level: Not on file  Occupational History   Not on file  Tobacco Use   Smoking status: Former    Packs/day: 1.00    Years: 25.00    Pack years: 25.00    Types: Cigarettes    Quit date: 09/14/2007    Years since quitting: 14.0   Smokeless tobacco: Never  Vaping Use   Vaping Use: Never used  Substance and Sexual Activity   Alcohol use: Not Currently    Comment: occasional/rare (Brandy every now and then)   Drug use: No   Sexual activity: Yes    Birth control/protection: Post-menopausal, Surgical  Other Topics Concern   Not on file  Social History Narrative   Not on file   Social Determinants of Health   Financial Resource Strain: Not on file  Food Insecurity: Not on file  Transportation Needs: Not on file  Physical Activity: Not on file  Stress: Not on file  Social Connections: Not on file  Intimate Partner Violence: Not on file    FAMILY HISTORY:  Family History  Problem Relation Age of Onset   Heart failure Mother    Diabetes Sister    Heart attack Father    Colon cancer Neg Hx    Gastric cancer Neg Hx    Esophageal cancer Neg Hx     CURRENT MEDICATIONS:  Outpatient Encounter Medications as of 09/14/2021  Medication Sig   acetaminophen (TYLENOL) 500 MG tablet Take 500 mg by mouth every 6 (six) hours as needed for mild pain or moderate pain.   amitriptyline (ELAVIL) 25 MG tablet Take 25 mg by mouth at bedtime as needed for sleep.    aspirin EC 325 MG tablet Take 325 mg by mouth daily.   atorvastatin (LIPITOR) 40 MG tablet Take 1 tablet (40 mg total) by mouth every evening.   escitalopram (LEXAPRO) 10 MG tablet Take 1 tablet by mouth daily.   esomeprazole (NEXIUM) 40 MG capsule Take 40 mg by mouth daily before breakfast.   ezetimibe (ZETIA) 10 MG tablet Take 10 mg by mouth in the morning.    fish  oil-omega-3 fatty acids 1000 MG capsule Take 1 g by mouth in the morning.    meclizine (ANTIVERT) 25 MG tablet Take 25 mg by mouth daily as needed.   potassium chloride SA (KLOR-CON) 20 MEQ tablet Take 20 mEq by mouth 2 (two) times daily.   VITAMIN B12 TR 1000 MCG TBCR Take 1 tablet by mouth in the morning.    Vitamin D, Ergocalciferol, (DRISDOL) 1.25 MG (50000 UNIT) CAPS capsule Take by mouth.   No facility-administered encounter medications on file as of 09/14/2021.    ALLERGIES:  Allergies  Allergen Reactions   Latex Hives, Itching and Rash   Sulfa Antibiotics Hives and Itching   Other Hives   Pravastatin Sodium Other (See Comments)     PHYSICAL EXAM:  ECOG PERFORMANCE STATUS: 1 - Symptomatic but completely  ambulatory  There were no vitals filed for this visit. There were no vitals filed for this visit. Physical Exam Constitutional:      Appearance: Normal appearance.  HENT:     Head: Normocephalic and atraumatic.     Mouth/Throat:     Mouth: Mucous membranes are moist.  Eyes:     Extraocular Movements: Extraocular movements intact.     Pupils: Pupils are equal, round, and reactive to light.  Cardiovascular:     Rate and Rhythm: Normal rate and regular rhythm.     Pulses: Normal pulses.     Heart sounds: Normal heart sounds.  Pulmonary:     Effort: Pulmonary effort is normal.     Breath sounds: Normal breath sounds.  Abdominal:     General: Bowel sounds are normal.     Palpations: Abdomen is soft.     Tenderness: There is no abdominal tenderness.  Musculoskeletal:        General: No swelling.     Right lower leg: No edema.     Left lower leg: No edema.  Lymphadenopathy:     Cervical: No cervical adenopathy.  Skin:    General: Skin is warm and dry.  Neurological:     General: No focal deficit present.     Mental Status: She is alert and oriented to person, place, and time.  Psychiatric:        Mood and Affect: Mood normal.        Behavior: Behavior normal.      LABORATORY DATA:  I have reviewed the labs as listed.  CBC    Component Value Date/Time   WBC 4.6 07/15/2021 1001   RBC 3.14 (L) 07/15/2021 1001   HGB 9.4 (L) 07/15/2021 1001   HCT 31.2 (L) 07/15/2021 1001   PLT 315 07/15/2021 1001   MCV 99.4 07/15/2021 1001   MCH 29.9 07/15/2021 1001   MCHC 30.1 07/15/2021 1001   RDW 15.7 (H) 07/15/2021 1001   LYMPHSABS 0.9 07/15/2021 1001   MONOABS 0.5 07/15/2021 1001   EOSABS 0.1 07/15/2021 1001   BASOSABS 0.0 07/15/2021 1001   CMP Latest Ref Rng & Units 05/08/2021 05/04/2021 04/27/2021  Glucose 70 - 99 mg/dL 88 107(H) 85  BUN 8 - 23 mg/dL 8 9 11   Creatinine 0.44 - 1.00 mg/dL 0.96 0.94 0.94  Sodium 135 - 145 mmol/L 140 139 138  Potassium 3.5 - 5.1 mmol/L 4.2 4.0 4.2  Chloride 98 - 111 mmol/L 108 110 106  CO2 22 - 32 mmol/L 24 22 24   Calcium 8.9 - 10.3 mg/dL 9.4 8.9 9.0  Total Protein 6.5 - 8.1 g/dL - 7.3 6.8  Total Bilirubin 0.3 - 1.2 mg/dL - 0.4 0.4  Alkaline Phos 38 - 126 U/L - 54 51  AST 15 - 41 U/L - 20 19  ALT 0 - 44 U/L - 13 11    DIAGNOSTIC IMAGING:  I have independently reviewed the relevant imaging and discussed with the patient.  ASSESSMENT & PLAN: 1.  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  She also had a EGD and colonoscopy in 2016 did not show any evidence of active bleeding areas, however she had large internal hemorrhoids. 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  Renal function normal for age. - Last [name] infusion on 07/22/2021 and 07/29/2021 - Labs today (09/14/2021): Hgb 10.4, MCV 97.9, ferritin 138, iron saturation 15% - She continues to report  dark stools about once per week  - She remains symptomatic with ongoing fatigue, although this is slightly improved from previous. - PLAN: Suspect the patient has some degree of functional iron deficiency in the setting of chronic disease, evidenced by normal ferritin but low iron saturation.  Recommend additional IV [name] x 1.  Repeat labs and RTC in 3 months.   2.  Vitamin D deficiency: - Vitamin D elevated at 142.96 in June 2022, was instructed to stop taking her prescription strength vitamin D 50,000 units weekly and to switch to OTC 2,000 units daily  - Vitamin D today (07/15/2021) remains elevated at 128.20 - PLAN: Patient instructed to STOP taking vitamin D.  We will restart her on the lower dose when appropriate.  Recheck vitamin D level in 3 months (February 2023)   3.  Borderline vitamin B12 deficiency: - Labs on 05/04/2021 show vitamin B12 at 821 - She is currently taking B12 pill once daily - PLAN: Continue vitamin B12 supplement.  We will check B12 and methylmalonic acid in 3 months (February 2023).   PLAN SUMMARY & DISPOSITION: - IV [name] x1 - Labs (CBC, iron/TIBC, ferritin, B12, methylmalonic acid, folate, vitamin D) in 3 months with RTC the week after  All questions were answered. The patient knows to call the clinic with any problems, questions or concerns.  Medical decision making: Moderate  Time spent on visit: I spent 20 minutes counseling the patient face to face. The total time spent in the appointment was 30 minutes and more than 50% was on counseling.   Harriett Rush, PA-C  09/14/2021 5:08 PM

## 2021-09-14 ENCOUNTER — Inpatient Hospital Stay (HOSPITAL_BASED_OUTPATIENT_CLINIC_OR_DEPARTMENT_OTHER): Payer: Medicare Other | Admitting: Physician Assistant

## 2021-09-14 ENCOUNTER — Inpatient Hospital Stay (HOSPITAL_COMMUNITY): Payer: Medicare Other | Attending: Hematology

## 2021-09-14 ENCOUNTER — Other Ambulatory Visit: Payer: Self-pay

## 2021-09-14 VITALS — BP 111/81 | HR 91 | Temp 98.1°F | Resp 18 | Wt 144.5 lb

## 2021-09-14 DIAGNOSIS — E559 Vitamin D deficiency, unspecified: Secondary | ICD-10-CM | POA: Diagnosis not present

## 2021-09-14 DIAGNOSIS — E538 Deficiency of other specified B group vitamins: Secondary | ICD-10-CM | POA: Diagnosis not present

## 2021-09-14 DIAGNOSIS — Z90710 Acquired absence of both cervix and uterus: Secondary | ICD-10-CM | POA: Diagnosis not present

## 2021-09-14 DIAGNOSIS — Z87891 Personal history of nicotine dependence: Secondary | ICD-10-CM | POA: Insufficient documentation

## 2021-09-14 DIAGNOSIS — D509 Iron deficiency anemia, unspecified: Secondary | ICD-10-CM | POA: Insufficient documentation

## 2021-09-14 DIAGNOSIS — I10 Essential (primary) hypertension: Secondary | ICD-10-CM | POA: Diagnosis not present

## 2021-09-14 DIAGNOSIS — D500 Iron deficiency anemia secondary to blood loss (chronic): Secondary | ICD-10-CM

## 2021-09-14 LAB — CBC WITH DIFFERENTIAL/PLATELET
Abs Immature Granulocytes: 0.01 10*3/uL (ref 0.00–0.07)
Basophils Absolute: 0 10*3/uL (ref 0.0–0.1)
Basophils Relative: 0 %
Eosinophils Absolute: 0.1 10*3/uL (ref 0.0–0.5)
Eosinophils Relative: 2 %
HCT: 32.1 % — ABNORMAL LOW (ref 36.0–46.0)
Hemoglobin: 10.4 g/dL — ABNORMAL LOW (ref 12.0–15.0)
Immature Granulocytes: 0 %
Lymphocytes Relative: 20 %
Lymphs Abs: 1 10*3/uL (ref 0.7–4.0)
MCH: 31.7 pg (ref 26.0–34.0)
MCHC: 32.4 g/dL (ref 30.0–36.0)
MCV: 97.9 fL (ref 80.0–100.0)
Monocytes Absolute: 0.4 10*3/uL (ref 0.1–1.0)
Monocytes Relative: 8 %
Neutro Abs: 3.6 10*3/uL (ref 1.7–7.7)
Neutrophils Relative %: 70 %
Platelets: 304 10*3/uL (ref 150–400)
RBC: 3.28 MIL/uL — ABNORMAL LOW (ref 3.87–5.11)
RDW: 14.9 % (ref 11.5–15.5)
WBC: 5.2 10*3/uL (ref 4.0–10.5)
nRBC: 0 % (ref 0.0–0.2)

## 2021-09-14 LAB — IRON AND TIBC
Iron: 40 ug/dL (ref 28–170)
Saturation Ratios: 15 % (ref 10.4–31.8)
TIBC: 268 ug/dL (ref 250–450)
UIBC: 228 ug/dL

## 2021-09-14 LAB — FERRITIN: Ferritin: 138 ng/mL (ref 11–307)

## 2021-09-14 NOTE — Patient Instructions (Signed)
[place] at [facility] Discharge Instructions  You were seen today by Tarri Abernethy PA-C for your iron deficiency anemia.  Your blood and iron levels have improved after your last round of IV iron, but are not quite at their target level.  Therefore, I recommend additional dose of IV iron with [name] x1 dose.  We will schedule this for you in the next week or two, and we will see you for follow-up in about 3 months.  If you experience any alarming symptoms before your next follow-up visit (such as worsening fatigue, shortness of breath, chest pain, palpitations, or blood in your bowel movements), please call our office so that we can bring you in for a follow-up appointment sooner rather than later.  If any of these symptoms are severe, proceed to the emergency department.  LABS: Return in 3 months for repeat labs  OTHER TESTS: No other tests at this time  MEDICATIONS: IV iron x1 dose  FOLLOW-UP APPOINTMENT: Office visit in 3 months, after labs   Thank you for choosing [place] at [facility] to provide your oncology and hematology care.  To afford each patient quality time with our provider, please arrive at least 15 minutes before your scheduled appointment time.   If you have a lab appointment with the [facility] please come in thru the Main Entrance and check in at the main information desk.  You need to re-schedule your appointment should you arrive 10 or more minutes late.  We strive to give you quality time with our providers, and arriving late affects you and other patients whose appointments are after yours.  Also, if you no show three or more times for appointments you may be dismissed from the clinic at the providers discretion.     Again, thank you for choosing [facility].  Our hope is that these requests will decrease the amount of time that you wait before being seen by our physicians.        _____________________________________________________________  Should you have questions after your visit to [facility], please contact our office at [phone] and follow the prompts.  Our office hours are 8:00 a.m. and 4:30 p.m. Monday - Friday.  Please note that voicemails left after 4:00 p.m. may not be returned until the following business day.  We are closed weekends and major holidays.  You do have access to a nurse 24-7, just call the main number to the clinic [phone] and do not press any options, hold on the line and a nurse will answer the phone.    For prescription refill requests, have your pharmacy contact our office and allow 72 hours.    Due to Covid, you will need to wear a mask upon entering the hospital. If you do not have a mask, a mask will be given to you at the Main Entrance upon arrival. For doctor visits, patients may have 1 support person age 5 or older with them. For treatment visits, patients can not have anyone with them due to social distancing guidelines and our immunocompromised population.

## 2021-09-20 ENCOUNTER — Inpatient Hospital Stay (HOSPITAL_COMMUNITY): Payer: Medicare Other

## 2021-11-02 ENCOUNTER — Other Ambulatory Visit: Payer: Self-pay

## 2021-11-02 ENCOUNTER — Encounter (HOSPITAL_COMMUNITY): Payer: Self-pay

## 2021-11-02 ENCOUNTER — Emergency Department (HOSPITAL_COMMUNITY): Payer: Medicare Other

## 2021-11-02 ENCOUNTER — Emergency Department (HOSPITAL_COMMUNITY)
Admission: EM | Admit: 2021-11-02 | Discharge: 2021-11-02 | Disposition: A | Discharge status: Home / Self Care | Payer: Medicare Other | Attending: Emergency Medicine | Admitting: Emergency Medicine

## 2021-11-02 DIAGNOSIS — Z87891 Personal history of nicotine dependence: Secondary | ICD-10-CM | POA: Diagnosis not present

## 2021-11-02 DIAGNOSIS — R531 Weakness: Secondary | ICD-10-CM | POA: Insufficient documentation

## 2021-11-02 DIAGNOSIS — Z7982 Long term (current) use of aspirin: Secondary | ICD-10-CM | POA: Insufficient documentation

## 2021-11-02 DIAGNOSIS — I10 Essential (primary) hypertension: Secondary | ICD-10-CM | POA: Diagnosis not present

## 2021-11-02 DIAGNOSIS — Z20822 Contact with and (suspected) exposure to covid-19: Secondary | ICD-10-CM | POA: Insufficient documentation

## 2021-11-02 DIAGNOSIS — Z91040 Latex allergy status: Secondary | ICD-10-CM | POA: Diagnosis not present

## 2021-11-02 LAB — COMPREHENSIVE METABOLIC PANEL
ALT: 12 U/L (ref 0–44)
AST: 23 U/L (ref 15–41)
Albumin: 3.8 g/dL (ref 3.5–5.0)
Alkaline Phosphatase: 68 U/L (ref 38–126)
Anion gap: 7 (ref 5–15)
BUN: 8 mg/dL (ref 8–23)
CO2: 25 mmol/L (ref 22–32)
Calcium: 8.8 mg/dL — ABNORMAL LOW (ref 8.9–10.3)
Chloride: 107 mmol/L (ref 98–111)
Creatinine, Ser: 0.88 mg/dL (ref 0.44–1.00)
GFR, Estimated: >60 mL/min (ref >60)
Glucose, Bld: 86 mg/dL (ref 70–99)
Potassium: 4.1 mmol/L (ref 3.5–5.1)
Sodium: 139 mmol/L (ref 135–145)
Total Bilirubin: 0.1 mg/dL — ABNORMAL LOW (ref 0.3–1.2)
Total Protein: 7.4 g/dL (ref 6.5–8.1)

## 2021-11-02 LAB — CBC WITH DIFFERENTIAL/PLATELET
Abs Immature Granulocytes: 0.02 10*3/uL (ref 0.00–0.07)
Basophils Absolute: 0 10*3/uL (ref 0.0–0.1)
Basophils Relative: 0 %
Eosinophils Absolute: 0.1 10*3/uL (ref 0.0–0.5)
Eosinophils Relative: 1 %
HCT: 31.6 % — ABNORMAL LOW (ref 36.0–46.0)
Hemoglobin: 9.5 g/dL — ABNORMAL LOW (ref 12.0–15.0)
Immature Granulocytes: 0 %
Lymphocytes Relative: 15 %
Lymphs Abs: 0.8 10*3/uL (ref 0.7–4.0)
MCH: 29.3 pg (ref 26.0–34.0)
MCHC: 30.1 g/dL (ref 30.0–36.0)
MCV: 97.5 fL (ref 80.0–100.0)
Monocytes Absolute: 0.5 10*3/uL (ref 0.1–1.0)
Monocytes Relative: 9 %
Neutro Abs: 3.8 10*3/uL (ref 1.7–7.7)
Neutrophils Relative %: 75 %
Platelets: 328 10*3/uL (ref 150–400)
RBC: 3.24 MIL/uL — ABNORMAL LOW (ref 3.87–5.11)
RDW: 14.6 % (ref 11.5–15.5)
WBC: 5.1 10*3/uL (ref 4.0–10.5)
nRBC: 0 % (ref 0.0–0.2)

## 2021-11-02 LAB — RESP PANEL BY RT-PCR (FLU A&B, COVID) ARPGX2
Influenza A by PCR: NEGATIVE
Influenza B by PCR: NEGATIVE
SARS Coronavirus 2 by RT PCR: NEGATIVE

## 2021-11-02 LAB — URINALYSIS, ROUTINE W REFLEX MICROSCOPIC
Bilirubin Urine: NEGATIVE
Glucose, UA: NEGATIVE mg/dL
Hgb urine dipstick: NEGATIVE
Ketones, ur: NEGATIVE mg/dL
Nitrite: NEGATIVE
Protein, ur: NEGATIVE mg/dL
Specific Gravity, Urine: 1.01 (ref 1.005–1.030)
pH: 7.5 (ref 5.0–8.0)

## 2021-11-02 LAB — URINALYSIS, MICROSCOPIC (REFLEX)
Bacteria, UA: NONE SEEN
RBC / HPF: NONE SEEN RBC/hpf (ref 0–5)
Squamous Epithelial / HPF: NONE SEEN (ref 0–5)

## 2021-11-02 LAB — TROPONIN I (HIGH SENSITIVITY)
Troponin I (High Sensitivity): 6 ng/L (ref <18)
Troponin I (High Sensitivity): 7 ng/L (ref <18)

## 2021-11-02 LAB — D-DIMER, QUANTITATIVE: D-Dimer, Quant: 1.06 ug/mL-FEU — ABNORMAL HIGH (ref 0.00–0.50)

## 2021-11-02 MED ORDER — SODIUM CHLORIDE 0.9 % IV BOLUS
1000.0000 mL | Freq: Once | INTRAVENOUS | Status: AC
  Administered 2021-11-02: 11:00:00 1000 mL via INTRAVENOUS

## 2021-11-02 NOTE — ED Triage Notes (Signed)
Pt arrived via EMS with complaints of weakness since yesterday. Pt reports becoming SOB and dizzy with ambulation. CBG 95.

## 2021-11-02 NOTE — ED Provider Notes (Signed)
[facility] EMERGENCY DEPARTMENT Provider Note   CSN: [id number] Arrival date & time: 11/02/21  4782     History Chief Complaint  Patient presents with   Weakness    Melody Mitchell is a 80 y.o. female.  HPI     80 year old female comes in with chief complaint of weakness. Patient has history of iron deficiency anemia, bronchitis and arthritis.  Patient reports that she has been having generalized weakness since the last 2 days.  She denies any associated headache, neck pain, chest pain, cough, abdominal pain, vomiting, diarrhea, burning with urination, blood in the urine.  She does indicate having some shortness of breath and dizziness type symptoms when she is ambulating.  She is eating and drinking well.  No new medications.   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   Abnormal CT of the abdomen 02/02/2017   Rectal bleeding 02/02/2017   Aortic atherosclerosis ([place]) 01/03/2017   Colitis 01/02/2017   Hypokalemia 01/02/2017   Heme positive stool 06/10/2015   Change in bowel habits 06/10/2015   Abdominal pain, epigastric 06/10/2015   High cholesterol    Acid reflux    GERD 01/03/2011   HEMORRHOIDS 04/15/2010   Iron deficiency anemia due to chronic blood loss 01/12/2010   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 four colon polyps removed. No source  for change in bowel habits identified 3. Rectal bleeding due to large internal hemorrhoids    COLONOSCOPY W/ BIOPSIES  6 2010   Dr. Oneida Alar: Polypoid appearing lesion of the ascending colon, 3 mm sessile rectal polyp, small internal hemorrhoids. Pathology revealed polypoid mucosa, no adenomatous changes   Double-balloon enteroscopy, antegrade  April 2011   Dr. 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   ESOPHAGOGASTRODUODENOSCOPY  04/2009   Dr. Oneida Alar: Patent Schatzki ring dilated with advancing the scope, patchy erythema in the antrum, 2 small AVMs in the duodenal bulb, 2 additional AVMs noted in the second portion the duodenum, mild gastritis on pathology   ESOPHAGOGASTRODUODENOSCOPY N/A 06/29/2015   SLF: 1. 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 small hiatal hernia 3. Mild non-erosive gastririts- NO obvious source for dyspepsia identified.    ESOPHAGOGASTRODUODENOSCOPY N/A 03/13/2020   Fields: Low-grade narrowing Schatzki ring, small hiatal hernia, multiple nonbleeding angiectasia's in the jejunum treated with APC.  Spot tattoo in jejunum at 90 cm from the incisors.   EYE SURGERY       OB History     Gravida  2   Para  2   Term  2  Preterm      AB      Living  1      SAB      IAB      Ectopic      Multiple      Live Births              Family History  Problem Relation Age of Onset   Heart failure Mother    Diabetes Sister    Heart attack Father    Colon cancer Neg Hx    Gastric cancer Neg Hx    Esophageal cancer Neg Hx     Social History   Tobacco Use   Smoking status: Former    Packs/day: 1.00    Years: 25.00    Pack years: 25.00    Types: Cigarettes    Quit date: 09/14/2007    Years since quitting: 14.1   Smokeless tobacco: Never  Vaping Use   Vaping Use: Never used   Substance Use Topics   Alcohol use: Not Currently    Comment: occasional/rare (Brandy every now and then)   Drug use: No    Home Medications Prior to Admission medications   Medication Sig Start Date End Date Taking? Authorizing Provider  acetaminophen (TYLENOL) 500 MG tablet Take 500 mg by mouth every 6 (six) hours as needed for mild pain or moderate pain.   Yes [provider]  amitriptyline (ELAVIL) 25 MG tablet Take 25 mg by mouth at bedtime as needed for sleep.   Yes [provider]  aspirin EC 325 MG tablet Take 325 mg by mouth daily. 03/20/20  Yes [provider]  atorvastatin (LIPITOR) 40 MG tablet Take 1 tablet (40 mg total) by mouth every evening. 09/06/19 11/02/21 Yes Emokpae, Courage, MD  escitalopram (LEXAPRO) 10 MG tablet Take 1 tablet by mouth daily. 09/04/20  Yes [provider]  esomeprazole (NEXIUM) 40 MG capsule Take 40 mg by mouth daily before breakfast.   Yes [provider]  ezetimibe (ZETIA) 10 MG tablet Take 10 mg by mouth in the morning.  11/11/19  Yes [provider]  fish oil-omega-3 fatty acids 1000 MG capsule Take 1 g by mouth in the morning.    Yes [provider]  meclizine (ANTIVERT) 25 MG tablet Take 25 mg by mouth daily as needed. 08/07/20  Yes [provider]  potassium chloride SA (KLOR-CON) 20 MEQ tablet Take 20 mEq by mouth 2 (two) times daily. 03/20/20  Yes [provider]  VITAMIN B12 TR 1000 MCG TBCR Take 1 tablet by mouth in the morning.  11/11/19  Yes [provider]  Vitamin D, Ergocalciferol, (DRISDOL) 1.25 MG (50000 UNIT) CAPS capsule Take 50,000 Units by mouth every 7 (seven) days. 07/09/21  Yes [provider]    Allergies    Latex, Sulfa antibiotics, Other, and Pravastatin sodium  Review of Systems   Review of Systems  Constitutional:  Positive for activity change.  Respiratory:  Positive for shortness of breath.   Cardiovascular:  Negative for  chest pain.  Gastrointestinal:  Negative for nausea and vomiting.  All other systems reviewed and are negative.  Physical Exam Updated Vital Signs BP (!) 141/94    Pulse 77    Temp 98.7 F (37.1 C) (Oral)    Resp (!) 21    Ht 5\' 4"  (1.626 m)    Wt 72.5 kg    SpO2 96%    BMI 27.45 kg/m   Physical Exam Constitutional:  Appearance: She is well-developed.  HENT:     Head: Normocephalic and atraumatic.  Eyes:     Extraocular Movements: Extraocular movements intact.  Cardiovascular:     Rate and Rhythm: Normal rate.     Heart sounds: Normal heart sounds.  Pulmonary:     Effort: Pulmonary effort is normal. No respiratory distress.  Abdominal:     General: There is no distension.     Palpations: Abdomen is soft.     Tenderness: There is no abdominal tenderness.  Musculoskeletal:        General: Swelling present. No tenderness.     Cervical back: Neck supple.     Right lower leg: No edema.     Left lower leg: No edema.  Skin:    General: Skin is warm and dry.     Coloration: Skin is not jaundiced or pale.  Neurological:     Mental Status: She is alert and oriented to person, place, and time.     Cranial Nerves: No cranial nerve deficit.     Sensory: No sensory deficit.     Motor: No weakness.     Coordination: Coordination normal.    ED Results / Procedures / Treatments   Labs (all labs ordered are listed, but only abnormal results are displayed) Labs Reviewed  COMPREHENSIVE METABOLIC PANEL - Abnormal; Notable for the following components:      Result Value   Calcium 8.8 (*)    Total Bilirubin 0.1 (*)    All other components within normal limits  D-DIMER, QUANTITATIVE - Abnormal; Notable for the following components:   D-Dimer, Quant 1.06 (*)    All other components within normal limits  CBC WITH DIFFERENTIAL/PLATELET - Abnormal; Notable for the following components:   RBC 3.24 (*)    Hemoglobin 9.5 (*)    HCT 31.6 (*)    All other components within normal limits   URINALYSIS, ROUTINE W REFLEX MICROSCOPIC - Abnormal; Notable for the following components:   Leukocytes,Ua SMALL (*)    All other components within normal limits  RESP PANEL BY RT-PCR (FLU A&B, COVID) ARPGX2  URINALYSIS, MICROSCOPIC (REFLEX)  TROPONIN I (HIGH SENSITIVITY)  TROPONIN I (HIGH SENSITIVITY)    EKG EKG Interpretation  Date/Time:  Tuesday November 02 2021 09:59:36 EST Ventricular Rate:  87 PR Interval:  163 QRS Duration: 93 QT Interval:  371 QTC Calculation: 447 R Axis:   -7 Text Interpretation: Sinus rhythm Low voltage, precordial leads Abnormal R-wave progression, early transition No acute changes No significant change since last tracing Confirmed by Varney Biles [phone]) on 11/02/2021 11:25:15 AM  Radiology US Venous Img Lower Unilateral Left  Result Date: 11/02/2021 CLINICAL DATA:  80 year old female with weakness and shortness of breath. EXAM: LEFT LOWER EXTREMITY VENOUS DOPPLER ULTRASOUND TECHNIQUE: Gray-scale sonography with graded compression, as well as color Doppler and duplex ultrasound were performed to evaluate the left lower extremity deep venous systems from the level of the common femoral vein and including the common femoral, femoral, profunda femoral, popliteal and calf veins including the posterior tibial, peroneal and gastrocnemius veins when visible. Spectral Doppler was utilized to evaluate flow at rest and with distal augmentation maneuvers in the common femoral, femoral and popliteal veins. The contralateral common femoral vein was also evaluated for comparison. COMPARISON:  None. FINDINGS: LEFT LOWER EXTREMITY Common Femoral Vein: No evidence of thrombus. Normal compressibility, respiratory phasicity and response to augmentation. Central Greater Saphenous Vein: No evidence of thrombus. Normal compressibility and flow on color Doppler  imaging. Central Profunda Femoral Vein: No evidence of thrombus. Normal compressibility and flow on color Doppler  imaging. Femoral Vein: No evidence of thrombus. Normal compressibility, respiratory phasicity and response to augmentation. Popliteal Vein: No evidence of thrombus. Normal compressibility, respiratory phasicity and response to augmentation. Calf Veins: No evidence of thrombus. Normal compressibility and flow on color Doppler imaging. Other Findings:  None. RIGHT LOWER EXTREMITY Common Femoral Vein: No evidence of thrombus. Normal compressibility, respiratory phasicity and response to augmentation. IMPRESSION: No evidence of left lower extremity deep venous thrombosis. Ruthann Cancer, MD [facility] Radiology Electronically Signed   By: Ruthann Cancer M.D.   On: 11/02/2021 13:27    Procedures Procedures   Medications Ordered in ED Medications  sodium chloride 0.9 % bolus 1,000 mL (0 mLs Intravenous Stopped 11/02/21 1252)    ED Course  I have reviewed the triage vital signs and the nursing notes.  Pertinent labs & imaging results that were available during my care of the patient were reviewed by me and considered in my medical decision making (see chart for details).  Clinical Course as of 11/02/21 1505  Tue Nov 02, 2021  1504 DVT scan is negative.  UA is completely clear.  Troponins x2 are normal.  White count, hemoglobin are also reassuring.  Results discussed with the patient.  She is stable for discharge at this time.  Strict ER return precautions discussed, she will return to the ER if her symptoms get worse or she starts developing new symptoms such as headaches, chest pain, shortness of breath, fainting, palpitations. [AN]    Clinical Course User Index [AN] Varney Biles, MD   MDM Rules/Calculators/A&P                         80 year old female comes in with chief complaint of weakness.  She also indicates some dizziness and shortness of breath with exertion  On her history, there is no associated infection like prodromal symptoms.  Her  p.o. intake has been normal.  She denies any bloody stools, chest pains, headaches, neurologic symptoms.  Patient's cardiopulmonary and neurologic exam are reassuring.  She does have left lower extremity focal unilateral swelling without any tenderness.  We will send a D-dimer to ensure there is no DVT  Differential diagnosis considered includes symptomatic anemia, electrolyte abnormalities, COVID or flu, silent MI, DVT/PE.     Final Clinical Impression(s) / ED Diagnoses Final diagnoses:  Generalized weakness    Rx / DC Orders ED Discharge Orders     None        Varney Biles, MD 11/02/21 1505

## 2021-11-02 NOTE — ED Notes (Signed)
Pulse ox 91% when ambulating. Pt back in bed O2 94%, BP cycling, and cardiac monitor in place.

## 2021-11-02 NOTE — Discharge Instructions (Addendum)
We saw you in the ER for weakness. All the results in the ER are normal, labs and imaging. We are not sure what is causing your symptoms. The workup in the ER is not complete, and is limited to screening for life threatening and emergent conditions only, so please see a primary care doctor for further evaluation.  Please return to the ER if your symptoms worsen; you have increased pain, fevers, chills, inability to keep any medications down, confusion. Otherwise see the outpatient doctor as requested.

## 2021-11-02 NOTE — ED Notes (Signed)
Assisted pt to restroom and back to stretcher.

## 2021-11-06 ENCOUNTER — Emergency Department (HOSPITAL_COMMUNITY)
Admission: EM | Admit: 2021-11-06 | Discharge: 2021-11-06 | Disposition: A | Discharge status: Home / Self Care | Payer: Medicare Other | Attending: Emergency Medicine | Admitting: Emergency Medicine

## 2021-11-06 ENCOUNTER — Emergency Department (HOSPITAL_COMMUNITY): Payer: Medicare Other

## 2021-11-06 ENCOUNTER — Other Ambulatory Visit: Payer: Self-pay

## 2021-11-06 ENCOUNTER — Encounter (HOSPITAL_COMMUNITY): Payer: Self-pay

## 2021-11-06 DIAGNOSIS — Z79899 Other long term (current) drug therapy: Secondary | ICD-10-CM | POA: Diagnosis not present

## 2021-11-06 DIAGNOSIS — Z7982 Long term (current) use of aspirin: Secondary | ICD-10-CM | POA: Insufficient documentation

## 2021-11-06 DIAGNOSIS — R109 Unspecified abdominal pain: Secondary | ICD-10-CM | POA: Insufficient documentation

## 2021-11-06 DIAGNOSIS — Z91040 Latex allergy status: Secondary | ICD-10-CM | POA: Insufficient documentation

## 2021-11-06 DIAGNOSIS — U071 COVID-19: Secondary | ICD-10-CM | POA: Diagnosis not present

## 2021-11-06 DIAGNOSIS — I10 Essential (primary) hypertension: Secondary | ICD-10-CM | POA: Diagnosis not present

## 2021-11-06 DIAGNOSIS — R531 Weakness: Secondary | ICD-10-CM | POA: Diagnosis present

## 2021-11-06 DIAGNOSIS — Z87891 Personal history of nicotine dependence: Secondary | ICD-10-CM | POA: Diagnosis not present

## 2021-11-06 LAB — RESP PANEL BY RT-PCR (FLU A&B, COVID) ARPGX2
Influenza A by PCR: NEGATIVE
Influenza B by PCR: NEGATIVE
SARS Coronavirus 2 by RT PCR: POSITIVE — AB

## 2021-11-06 LAB — URINALYSIS, ROUTINE W REFLEX MICROSCOPIC
Bilirubin Urine: NEGATIVE
Glucose, UA: NEGATIVE mg/dL
Hgb urine dipstick: NEGATIVE
Ketones, ur: NEGATIVE mg/dL
Leukocytes,Ua: NEGATIVE
Nitrite: NEGATIVE
Protein, ur: NEGATIVE mg/dL
Specific Gravity, Urine: 1.025 (ref 1.005–1.030)
pH: 6 (ref 5.0–8.0)

## 2021-11-06 LAB — LIPASE, BLOOD: Lipase: 54 U/L — ABNORMAL HIGH (ref 11–51)

## 2021-11-06 LAB — CBC WITH DIFFERENTIAL/PLATELET
Abs Immature Granulocytes: 0.02 10*3/uL (ref 0.00–0.07)
Basophils Absolute: 0 10*3/uL (ref 0.0–0.1)
Basophils Relative: 0 %
Eosinophils Absolute: 0 10*3/uL (ref 0.0–0.5)
Eosinophils Relative: 0 %
HCT: 30.1 % — ABNORMAL LOW (ref 36.0–46.0)
Hemoglobin: 9.4 g/dL — ABNORMAL LOW (ref 12.0–15.0)
Immature Granulocytes: 0 %
Lymphocytes Relative: 6 %
Lymphs Abs: 0.3 10*3/uL — ABNORMAL LOW (ref 0.7–4.0)
MCH: 29.6 pg (ref 26.0–34.0)
MCHC: 31.2 g/dL (ref 30.0–36.0)
MCV: 94.7 fL (ref 80.0–100.0)
Monocytes Absolute: 0.6 10*3/uL (ref 0.1–1.0)
Monocytes Relative: 13 %
Neutro Abs: 4 10*3/uL (ref 1.7–7.7)
Neutrophils Relative %: 81 %
Platelets: 319 10*3/uL (ref 150–400)
RBC: 3.18 MIL/uL — ABNORMAL LOW (ref 3.87–5.11)
RDW: 14.6 % (ref 11.5–15.5)
WBC: 5 10*3/uL (ref 4.0–10.5)
nRBC: 0 % (ref 0.0–0.2)

## 2021-11-06 LAB — COMPREHENSIVE METABOLIC PANEL
ALT: 17 U/L (ref 0–44)
AST: 29 U/L (ref 15–41)
Albumin: 4 g/dL (ref 3.5–5.0)
Alkaline Phosphatase: 76 U/L (ref 38–126)
Anion gap: 9 (ref 5–15)
BUN: 7 mg/dL — ABNORMAL LOW (ref 8–23)
CO2: 25 mmol/L (ref 22–32)
Calcium: 9 mg/dL (ref 8.9–10.3)
Chloride: 103 mmol/L (ref 98–111)
Creatinine, Ser: 0.84 mg/dL (ref 0.44–1.00)
GFR, Estimated: >60 mL/min (ref >60)
Glucose, Bld: 91 mg/dL (ref 70–99)
Potassium: 3.7 mmol/L (ref 3.5–5.1)
Sodium: 137 mmol/L (ref 135–145)
Total Bilirubin: 0.4 mg/dL (ref 0.3–1.2)
Total Protein: 7.4 g/dL (ref 6.5–8.1)

## 2021-11-06 MED ORDER — ONDANSETRON HCL 4 MG/2ML IJ SOLN
4.0000 mg | Freq: Once | INTRAMUSCULAR | Status: AC
  Administered 2021-11-06: 09:00:00 4 mg via INTRAVENOUS
  Filled 2021-11-06: qty 2

## 2021-11-06 MED ORDER — SODIUM CHLORIDE 0.9 % IV BOLUS
500.0000 mL | Freq: Once | INTRAVENOUS | Status: AC
  Administered 2021-11-06: 09:00:00 500 mL via INTRAVENOUS

## 2021-11-06 MED ORDER — ONDANSETRON 4 MG PO TBDP: ORAL_TABLET | ORAL | 0 refills | Status: DC

## 2021-11-06 MED ORDER — IOHEXOL 300 MG/ML  SOLN
100.0000 mL | Freq: Once | INTRAMUSCULAR | Status: AC | PRN
  Administered 2021-11-06: 11:00:00 100 mL via INTRAVENOUS

## 2021-11-06 NOTE — Discharge Instructions (Signed)
Drink plenty of fluids and rest.  Take Zofran for the nausea.  Follow-up with her doctor this week if any problem

## 2021-11-06 NOTE — ED Provider Notes (Signed)
[facility] EMERGENCY DEPARTMENT Provider Note   CSN: [id number] Arrival date & time: 11/06/21  0755     History Chief Complaint  Patient presents with   Weakness    Melody Mitchell is a 80 y.o. female.  Patient complains of weakness some vomiting and diarrhea and fever.    The history is provided by the patient and medical records. No language interpreter was used.  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   ANTEGRADE BALLOON ENTEROSCOPY W/ APC IN 2011 [facility]   Bronchitis january 2014   Hiatal hernia    High cholesterol    History of contact dermatitis    Hx: recurrent pneumonia    Hypertension    IDA (iron deficiency anemia)    chronic iron infusions   Reflux    Vertigo     Patient Active Problem List   Diagnosis Date Noted   Dark stools 12/18/2019   GNR UTI (urinary tract infection) 09/06/2019   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   Abnormal CT of the abdomen 02/02/2017   Rectal bleeding 02/02/2017   Aortic atherosclerosis ([place]) 01/03/2017   Colitis 01/02/2017   Hypokalemia 01/02/2017   Heme positive stool 06/10/2015   Change in bowel habits 06/10/2015   Abdominal pain, epigastric 06/10/2015   High cholesterol    Acid reflux    GERD 01/03/2011   HEMORRHOIDS 04/15/2010   Iron deficiency anemia due to chronic blood loss 01/12/2010   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 four colon polyps removed. No source  for change in bowel habits identified 3. Rectal bleeding due to large internal hemorrhoids    COLONOSCOPY W/ BIOPSIES  6 2010   Dr. Oneida Alar: Polypoid appearing lesion of the ascending colon, 3 mm sessile rectal polyp, small internal hemorrhoids. Pathology revealed polypoid mucosa, no adenomatous changes   Double-balloon enteroscopy, antegrade  April 2011   Dr. 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   ESOPHAGOGASTRODUODENOSCOPY  04/2009   Dr. Oneida Alar: Patent Schatzki ring dilated with advancing the scope, patchy erythema in the antrum, 2 small AVMs in the duodenal bulb, 2 additional AVMs noted in the second portion the duodenum, mild gastritis on pathology   ESOPHAGOGASTRODUODENOSCOPY N/A 06/29/2015   SLF: 1. 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 small hiatal hernia 3. Mild non-erosive gastririts- NO obvious source for dyspepsia identified.    ESOPHAGOGASTRODUODENOSCOPY N/A 03/13/2020   Fields: Low-grade narrowing Schatzki ring, small hiatal hernia, multiple nonbleeding angiectasia's in the jejunum treated with APC.  Spot tattoo in jejunum at 90 cm from the incisors.   EYE SURGERY       OB History     Gravida  2   Para  2   Term  2   Preterm      AB      Living  1      SAB      IAB      Ectopic      Multiple      Live Births              Family History  Problem Relation Age of Onset   Heart failure Mother    Diabetes Sister    Heart attack Father    Colon cancer Neg Hx    Gastric cancer Neg Hx    Esophageal cancer Neg Hx     Social History   Tobacco Use   Smoking status: Former    Packs/day: 1.00    Years: 25.00    Pack years: 25.00    Types: Cigarettes    Quit date: 09/14/2007    Years since quitting: 14.1   Smokeless tobacco: Never  Vaping Use   Vaping  Use: Never used  Substance Use Topics   Alcohol use: Not Currently    Comment: occasional/rare (Brandy every now and then)   Drug use: No    Home Medications Prior to Admission medications   Medication Sig Start Date End Date Taking? Authorizing Provider  ondansetron (ZOFRAN-ODT) 4 MG disintegrating tablet 4mg  ODT q4 hours prn nausea/vomit 11/06/21  Yes Milton Ferguson, MD  acetaminophen (TYLENOL) 500 MG tablet Take 500 mg by mouth every 6 (six) hours as needed for mild pain or moderate pain.    [provider]  amitriptyline (ELAVIL) 25 MG tablet Take 25 mg by mouth at bedtime as needed for sleep.    [provider]  aspirin EC 325 MG tablet Take 325 mg by mouth daily. 03/20/20   [provider]  atorvastatin (LIPITOR) 40 MG tablet Take 1 tablet (40 mg total) by mouth every evening. 09/06/19 11/02/21  Roxan Hockey, MD  escitalopram (LEXAPRO) 10 MG tablet Take 1 tablet by mouth daily. 09/04/20   [provider]  esomeprazole (NEXIUM) 40 MG capsule Take 40 mg by mouth daily before breakfast.    [provider]  ezetimibe (ZETIA) 10 MG tablet Take 10 mg by mouth in the morning.  11/11/19   [provider]  fish oil-omega-3 fatty acids 1000 MG capsule Take 1 g by mouth in the morning.     [provider]  meclizine (ANTIVERT) 25 MG tablet Take 25 mg by mouth daily as needed. 08/07/20   [provider]  potassium chloride SA (KLOR-CON) 20 MEQ tablet Take 20 mEq by mouth 2 (two) times daily. 03/20/20   [provider]  VITAMIN B12 TR 1000 MCG TBCR Take 1 tablet by mouth in the morning.  11/11/19   [provider]  Vitamin D, Ergocalciferol, (DRISDOL) 1.25 MG (50000 UNIT) CAPS capsule Take 50,000 Units by mouth every 7 (seven) days. 07/09/21   [provider]    Allergies    Latex, Sulfa antibiotics, Other, and Pravastatin sodium  Review of Systems   Review of Systems  Constitutional:  Negative for  appetite change and fatigue.  HENT:  Negative for congestion, ear discharge and sinus pressure.   Eyes:  Negative for discharge.  Respiratory:  Negative for cough.   Cardiovascular:  Negative for chest pain.  Gastrointestinal:  Positive for diarrhea and vomiting. Negative for abdominal pain.  Genitourinary:  Negative for frequency and hematuria.  Musculoskeletal:  Negative for back pain.  Skin:  Negative for rash.  Neurological:  Positive for weakness. Negative for seizures and headaches.  Psychiatric/Behavioral:  Negative for hallucinations.    Physical Exam Updated Vital Signs BP (!) 131/102    Pulse 77    Temp 100.2 F (37.9 C) (Oral)    Resp (!) 22    Ht 5\' 4"  (1.626 m)    Wt 72.5 kg    SpO2 97%    BMI 27.45 kg/m   Physical Exam Vitals and nursing note reviewed.  Constitutional:      Appearance: She is well-developed.     Comments: Patient nontoxic  HENT:     Head: Normocephalic.     Nose: Nose normal.  Eyes:     General: No scleral icterus.    Conjunctiva/sclera: Conjunctivae normal.  Neck:     Thyroid: No thyromegaly.  Cardiovascular:     Rate and Rhythm: Normal rate and regular rhythm.     Heart sounds: No murmur heard.   No friction rub. No gallop.  Pulmonary:     Breath sounds: No stridor. No wheezing or rales.  Chest:     Chest wall: No tenderness.  Abdominal:     General: There is no distension.     Tenderness: There is no abdominal tenderness. There is no rebound.  Musculoskeletal:        General: Normal range of motion.     Cervical back: Neck supple.  Lymphadenopathy:     Cervical: No cervical adenopathy.  Skin:    Findings: No erythema or rash.  Neurological:     Mental Status: She is alert and oriented to person, place, and time.     Motor: No abnormal muscle tone.     Coordination: Coordination normal.  Psychiatric:        Behavior: Behavior normal.    ED Results / Procedures / Treatments   Labs (all labs ordered are listed, but only  abnormal results are displayed) Labs Reviewed  RESP PANEL BY RT-PCR (FLU A&B, COVID) ARPGX2 - Abnormal; Notable for the following components:      Result Value   SARS Coronavirus 2 by RT PCR POSITIVE (*)    All other components within normal limits  CBC WITH DIFFERENTIAL/PLATELET - Abnormal; Notable for the following components:   RBC 3.18 (*)    Hemoglobin 9.4 (*)    HCT 30.1 (*)    Lymphs Abs 0.3 (*)    All other components within normal limits  COMPREHENSIVE METABOLIC PANEL - Abnormal; Notable for the following components:   BUN 7 (*)    All other components within normal limits  LIPASE, BLOOD - Abnormal; Notable for the following components:   Lipase 54 (*)    All other components within normal limits  URINALYSIS, ROUTINE W REFLEX MICROSCOPIC    EKG None  Radiology CT ABDOMEN PELVIS W CONTRAST  Result Date: 11/06/2021 CLINICAL DATA:  Acute abdominal pain, COVID-19 positive, generalized weakness, fever 101 degrees, vomiting EXAM: CT ABDOMEN AND PELVIS WITH CONTRAST TECHNIQUE: Multidetector CT imaging of the abdomen and pelvis was performed using the standard protocol following bolus administration of intravenous contrast. CONTRAST:  113mL OMNIPAQUE IOHEXOL 300 MG/ML SOLN IV. No oral contrast. COMPARISON:  12/09/2020 FINDINGS: Lower chest: Mild subsegmental atelectasis at lung bases Hepatobiliary: Gallbladder surgically absent. Liver normal appearance. Pancreas: Normal appearance Spleen: Normal appearance.  Small splenule posterior to spleen. Adrenals/Urinary Tract: Small cyst at inferior pole RIGHT kidney 8 mm diameter. Adrenal glands, kidneys, ureters, and bladder otherwise normal appearance. Stomach/Bowel: Stomach decompressed. Appendix surgically absent by  history. Large and small bowel loops unremarkable. Vascular/Lymphatic: Atherosclerotic calcifications aorta and iliac arteries. Aorta normal caliber. Few scattered pelvic phleboliths. No adenopathy. Reproductive: Uterus surgically  absent. RIGHT ovary not visualized. LEFT ovary stable. Other: No free air or free fluid. No acute inflammatory process. Small umbilical hernia containing fat. Musculoskeletal: Osseous demineralization with mild scattered degenerative disc and facet disease changes. IMPRESSION: No acute intra-abdominal or intrapelvic abnormalities. Small umbilical hernia containing fat. Aortic Atherosclerosis (ICD10-I70.0). Electronically Signed   By: Lavonia Dana M.D.   On: 11/06/2021 11:09    Procedures Procedures   Medications Ordered in ED Medications  ondansetron (ZOFRAN) injection 4 mg (4 mg Intravenous Given 11/06/21 0859)  sodium chloride 0.9 % bolus 500 mL (500 mLs Intravenous New Bag/Given 11/06/21 0859)  iohexol (OMNIPAQUE) 300 MG/ML solution 100 mL (100 mLs Intravenous Contrast Given 11/06/21 1040)    ED Course  I have reviewed the triage vital signs and the nursing notes.  Pertinent labs & imaging results that were available during my care of the patient were reviewed by me and considered in my medical decision making (see chart for details).    MDM Rules/Calculators/A&P                         Patient with COVID.  She will be treated symptomatically with Tylenol and Zofran and follow-up with her PCP    Final Clinical Impression(s) / ED Diagnoses Final diagnoses:  COVID-19    Rx / DC Orders ED Discharge Orders          Ordered    ondansetron (ZOFRAN-ODT) 4 MG disintegrating tablet        11/06/21 1148             Milton Ferguson, MD 11/07/21 [phone]

## 2021-11-06 NOTE — ED Triage Notes (Signed)
Pt presents to ED via [facility] for generalized weakness x couple of days. Upon arrival of EMS, pt had temp of 101, pt given tylenol 1000mg . Pt vomited this am.

## 2021-11-06 NOTE — ED Notes (Signed)
Pt covid + advised pt she could not keep leaving the room took her a bedside commode

## 2021-11-13 ENCOUNTER — Other Ambulatory Visit: Payer: Self-pay

## 2021-11-13 ENCOUNTER — Emergency Department (HOSPITAL_COMMUNITY)
Admission: EM | Admit: 2021-11-13 | Discharge: 2021-11-13 | Disposition: A | Discharge status: Home / Self Care | Payer: Medicare Other | Attending: Emergency Medicine | Admitting: Emergency Medicine

## 2021-11-13 ENCOUNTER — Emergency Department (HOSPITAL_COMMUNITY): Payer: Medicare Other

## 2021-11-13 ENCOUNTER — Encounter (HOSPITAL_COMMUNITY): Payer: Self-pay | Admitting: Emergency Medicine

## 2021-11-13 DIAGNOSIS — Z7982 Long term (current) use of aspirin: Secondary | ICD-10-CM | POA: Insufficient documentation

## 2021-11-13 DIAGNOSIS — R531 Weakness: Secondary | ICD-10-CM | POA: Insufficient documentation

## 2021-11-13 DIAGNOSIS — R5383 Other fatigue: Secondary | ICD-10-CM | POA: Insufficient documentation

## 2021-11-13 DIAGNOSIS — I10 Essential (primary) hypertension: Secondary | ICD-10-CM | POA: Insufficient documentation

## 2021-11-13 DIAGNOSIS — Z87891 Personal history of nicotine dependence: Secondary | ICD-10-CM | POA: Insufficient documentation

## 2021-11-13 DIAGNOSIS — Z79899 Other long term (current) drug therapy: Secondary | ICD-10-CM | POA: Diagnosis not present

## 2021-11-13 DIAGNOSIS — Z91040 Latex allergy status: Secondary | ICD-10-CM | POA: Insufficient documentation

## 2021-11-13 DIAGNOSIS — R059 Cough, unspecified: Secondary | ICD-10-CM | POA: Diagnosis not present

## 2021-11-13 LAB — CBC WITH DIFFERENTIAL/PLATELET
Abs Immature Granulocytes: 0.02 10*3/uL (ref 0.00–0.07)
Basophils Absolute: 0 10*3/uL (ref 0.0–0.1)
Basophils Relative: 0 %
Eosinophils Absolute: 0 10*3/uL (ref 0.0–0.5)
Eosinophils Relative: 0 %
HCT: 30.6 % — ABNORMAL LOW (ref 36.0–46.0)
Hemoglobin: 9.2 g/dL — ABNORMAL LOW (ref 12.0–15.0)
Immature Granulocytes: 1 %
Lymphocytes Relative: 18 %
Lymphs Abs: 0.8 10*3/uL (ref 0.7–4.0)
MCH: 28.4 pg (ref 26.0–34.0)
MCHC: 30.1 g/dL (ref 30.0–36.0)
MCV: 94.4 fL (ref 80.0–100.0)
Monocytes Absolute: 0.4 10*3/uL (ref 0.1–1.0)
Monocytes Relative: 9 %
Neutro Abs: 3.2 10*3/uL (ref 1.7–7.7)
Neutrophils Relative %: 72 %
Platelets: 322 10*3/uL (ref 150–400)
RBC: 3.24 MIL/uL — ABNORMAL LOW (ref 3.87–5.11)
RDW: 14.6 % (ref 11.5–15.5)
WBC: 4.4 10*3/uL (ref 4.0–10.5)
nRBC: 0 % (ref 0.0–0.2)

## 2021-11-13 LAB — BASIC METABOLIC PANEL
Anion gap: 8 (ref 5–15)
BUN: 9 mg/dL (ref 8–23)
CO2: 27 mmol/L (ref 22–32)
Calcium: 8.9 mg/dL (ref 8.9–10.3)
Chloride: 105 mmol/L (ref 98–111)
Creatinine, Ser: 0.87 mg/dL (ref 0.44–1.00)
GFR, Estimated: >60 mL/min (ref >60)
Glucose, Bld: 82 mg/dL (ref 70–99)
Potassium: 4.1 mmol/L (ref 3.5–5.1)
Sodium: 140 mmol/L (ref 135–145)

## 2021-11-13 MED ORDER — ACETAMINOPHEN 325 MG PO TABS
650.0000 mg | ORAL_TABLET | Freq: Once | ORAL | Status: AC
  Administered 2021-11-13: 650 mg via ORAL
  Filled 2021-11-13: qty 2

## 2021-11-13 NOTE — ED Provider Notes (Signed)
[facility] EMERGENCY DEPARTMENT Provider Note   CSN: [id number] Arrival date & time: 11/13/21  1025     History Chief Complaint  Patient presents with   Fatigue   Weakness    Melody Mitchell is a 80 y.o. female.  HPI  Patient with medical history including hypertension, reflux, vertigo presents with chief complaint of feeling fatigue, poor appetite, and a cough.  States that this started about 2 days time, she denies  fevers, chills, nasal congestion, she has a slight nonproductive cough, denies chest pain, shortness of breath, pleuritic chest pain, stomach pain, nausea, vomiting, diarrhea, general body aches.  Patient states that she has weakness but is generalized not unilateral,  states it is constant, she denies  headaches, change in vision, paresthesias in the upper or lower extremities, no recent head trauma, not on anticoagulant.  She has only complaints at this time.  Patient was seen on the 24th diagnosed with COVID, she was placed on antiviral treatment, she is not immunocompromise.  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   Abnormal CT of the abdomen 02/02/2017   Rectal bleeding 02/02/2017   Aortic atherosclerosis ([place]) 01/03/2017   Colitis 01/02/2017   Hypokalemia 01/02/2017   Heme positive stool 06/10/2015   Change in bowel habits 06/10/2015   Abdominal pain, epigastric 06/10/2015   High cholesterol    Acid reflux    GERD  01/03/2011   HEMORRHOIDS 04/15/2010   Iron deficiency anemia due to chronic blood loss 01/12/2010   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 four colon polyps removed. No source  for change in bowel habits identified 3. Rectal bleeding due to large internal hemorrhoids    COLONOSCOPY W/ BIOPSIES  6 2010   Dr. Oneida Alar: Polypoid appearing lesion of the ascending colon, 3 mm sessile rectal polyp, small internal hemorrhoids. Pathology revealed polypoid mucosa, no adenomatous changes   Double-balloon enteroscopy, antegrade  April 2011   Dr. 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   ESOPHAGOGASTRODUODENOSCOPY  04/2009   Dr. Oneida Alar: Patent Schatzki ring dilated with advancing the scope, patchy erythema in the antrum, 2 small AVMs in the duodenal bulb, 2 additional AVMs noted in the second portion the duodenum, mild gastritis on pathology   ESOPHAGOGASTRODUODENOSCOPY N/A 06/29/2015   SLF: 1. 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 small hiatal hernia 3. Mild non-erosive gastririts- NO obvious source for dyspepsia identified.    ESOPHAGOGASTRODUODENOSCOPY N/A 03/13/2020   Fields: Low-grade narrowing Schatzki ring, small hiatal hernia, multiple nonbleeding angiectasia's in the jejunum treated with APC.  Spot  tattoo in jejunum at 90 cm from the incisors.   EYE SURGERY       OB History     Gravida  2   Para  2   Term  2   Preterm      AB      Living  1      SAB      IAB      Ectopic      Multiple      Live Births              Family History  Problem Relation Age of Onset   Heart failure Mother    Diabetes Sister    Heart attack Father    Colon cancer Neg Hx    Gastric cancer Neg Hx    Esophageal cancer  Neg Hx     Social History   Tobacco Use   Smoking status: Former    Packs/day: 1.00    Years: 25.00    Pack years: 25.00    Types: Cigarettes    Quit date: 09/14/2007    Years since quitting: 14.1   Smokeless tobacco: Never  Vaping Use   Vaping Use: Never used  Substance Use Topics   Alcohol use: Not Currently    Comment: occasional/rare (Brandy every now and then)   Drug use: No    Home Medications Prior to Admission medications   Medication Sig Start Date End Date Taking? Authorizing Provider  acetaminophen (TYLENOL) 500 MG tablet Take 500 mg by mouth every 6 (six) hours as needed for mild pain or moderate pain.    [provider]  amitriptyline (ELAVIL) 25 MG tablet Take 25 mg by mouth at bedtime as needed for sleep.    [provider]  aspirin EC 325 MG tablet Take 325 mg by mouth daily. 03/20/20   [provider]  atorvastatin (LIPITOR) 40 MG tablet Take 1 tablet (40 mg total) by mouth every evening. 09/06/19 11/02/21  Roxan Hockey, MD  escitalopram (LEXAPRO) 10 MG tablet Take 1 tablet by mouth daily. 09/04/20   [provider]  esomeprazole (NEXIUM) 40 MG capsule Take 40 mg by mouth daily before breakfast.    [provider]  ezetimibe (ZETIA) 10 MG tablet Take 10 mg by mouth in the morning.  11/11/19   [provider]  fish oil-omega-3 fatty acids 1000 MG capsule Take 1 g by mouth in the morning.     [provider]  meclizine (ANTIVERT) 25 MG tablet Take 25 mg by mouth daily as needed. 08/07/20   [provider]  ondansetron (ZOFRAN-ODT) 4 MG disintegrating tablet 4mg  ODT q4 hours prn nausea/vomit 11/06/21   Milton Ferguson, MD  potassium chloride SA (KLOR-CON) 20 MEQ tablet Take 20 mEq by mouth 2 (two) times daily. 03/20/20   [provider]  VITAMIN B12 TR 1000 MCG TBCR Take 1 tablet by mouth in the morning.  11/11/19   [provider]  Vitamin D, Ergocalciferol, (DRISDOL) 1.25 MG  (50000 UNIT) CAPS capsule Take 50,000 Units by mouth every 7 (seven) days. 07/09/21   [provider]    Allergies    Latex, Sulfa antibiotics, Other, and Pravastatin sodium  Review of Systems   Review of Systems  Constitutional:  Positive for fatigue. Negative for chills and fever.  HENT:  Negative for congestion.   Respiratory:  Positive for cough. Negative for shortness of breath.   Cardiovascular:  Negative for chest pain.  Gastrointestinal:  Negative for abdominal pain.  Genitourinary:  Negative for enuresis.  Musculoskeletal:  Negative for back pain.  Skin:  Negative for rash.  Neurological:  Positive for weakness. Negative for dizziness.  Hematological:  Does not bruise/bleed easily.   Physical Exam Updated Vital Signs BP 115/89    Pulse 91    Temp 99.3 F (37.4 C) (Oral)    Resp 16    Ht 5\' 4"  (1.626 m)    Wt 72.5 kg    SpO2 95%    BMI 27.44 kg/m   Physical Exam Vitals and nursing note reviewed.  Constitutional:      General: She is not in acute distress.    Appearance: She is not ill-appearing.  HENT:     Head: Normocephalic and atraumatic.     Nose: No congestion.  Eyes:     Extraocular Movements: Extraocular movements intact.     Conjunctiva/sclera: Conjunctivae normal.  Cardiovascular:     Rate and Rhythm: Normal rate and regular rhythm.     Pulses: Normal pulses.     Heart sounds: No murmur heard.   No friction rub. No gallop.  Pulmonary:     Effort: No respiratory distress.     Breath sounds: No wheezing, rhonchi or rales.  Abdominal:     Palpations: Abdomen is soft.     Tenderness: There is no abdominal tenderness. There is no right CVA tenderness or left CVA tenderness.  Musculoskeletal:     Right lower leg: No edema.     Left lower leg: No edema.  Skin:    General: Skin is warm and dry.  Neurological:     Mental Status: She is alert.     Comments: No facial asymmetry, no difficult word finding, able follow two-step commands, no unilateral  weakness present.  Gait fully intact, While using walker  Psychiatric:        Mood and Affect: Mood normal.    ED Results / Procedures / Treatments   Labs (all labs ordered are listed, but only abnormal results are displayed) Labs Reviewed  CBC WITH DIFFERENTIAL/PLATELET - Abnormal; Notable for the following components:      Result Value   RBC 3.24 (*)    Hemoglobin 9.2 (*)    HCT 30.6 (*)    All other components within normal limits  BASIC METABOLIC PANEL    EKG None  Radiology DG Chest Port 1 View  Result Date: 11/13/2021 CLINICAL DATA:  80 year old female with history of cough and generalized weakness for the past 2 days. EXAM: PORTABLE CHEST 1 VIEW COMPARISON:  Chest x-ray 11/20/2020. FINDINGS: Lung volumes are normal. No consolidative airspace disease. No pleural effusions. No pneumothorax. No pulmonary nodule or mass noted. Pulmonary vasculature and the cardiomediastinal silhouette are within normal limits. Atherosclerosis in the thoracic aorta. IMPRESSION: 1.  No radiographic evidence of acute cardiopulmonary disease. 2. Aortic atherosclerosis. Electronically Signed   By: Vinnie Langton M.D.   On: 11/13/2021 12:42    Procedures Procedures   Medications Ordered in ED Medications  acetaminophen (TYLENOL) tablet 650 mg (650 mg Oral Given 11/13/21 1203)    ED Course  I have reviewed the triage vital signs and the nursing notes.  Pertinent labs & imaging results that were available during my care of the patient were reviewed by me and considered in my medical decision making (see chart for details).    MDM Rules/Calculators/A&P  Initial impression-presents with fatigue and a slight cough.  She is alert, no acute stress, vital signs reassuring.  Unclear etiology but I feel likely secondary to post-COVID.  Will obtain basic lab work-up chest x-ray ambulate the patient p.o. challenge and reassess.  Work-up-CBC shows normocytic anemia hemoglobin  9.2 appears to be at baseline, BNP is unremarkable, chest ray unremarkable, EKG sinus without signs of ischemia.  Patient is able difficulty, tolerating p.o.  Rule out-low suspicion for intracranial head bleed or CVA as she has no focal deficit present my exam, she denies headaches, change in vision, paresthesia or weakness of the lower extremities, will defer CT imaging at this time.  Blood pressure ACS she denies chest pain, shortness of breath, EKG without signs of ischemia.Low suspicion for systemic infection as patient is nontoxic-appearing, vital signs reassuring, no obvious source infection noted on exam.  Low suspicion for pneumonia as lung sounds are clear bilaterally, x-ray did not reveal any acute findings.  I have low suspicion for PE as patient denies pleuritic chest pain, shortness of breath, vital signs reassuring, nontachypneic, nonhypoxic.  Low suspicion patient would need  hospitalized due to viral infection or Covid as vital signs reassuring, patient is not in respiratory distress.    Plan-  Cough, fatigue-likely this is secondary due to post COVID, will recommend symptom management, follow-up with PCP as needed.  Gave strict return precautions.  Vital signs have remained stable, no indication for hospital admission.  Patient discussed with attending and they agreed with assessment and plan.  Patient given at home care as well strict return precautions.  Patient verbalized that they understood agreed to said plan    Final Clinical Impression(s) / ED Diagnoses Final diagnoses:  Weakness    Rx / DC Orders ED Discharge Orders     None        Marcello Fennel, PA-C 11/13/21 1443    Milton Ferguson, MD 11/16/21 [phone]

## 2021-11-13 NOTE — Discharge Instructions (Signed)
Lab or imaging all reassuring, I suspect this is from [place], recommend over-the-counter pain medication as needed, remember to stay hydrated, your symptoms improved after weeks time, if not please follow-up with your PCP for further evaluation.  Come back to the emergency department if you develop chest pain, shortness of breath, severe abdominal pain, uncontrolled nausea, vomiting, diarrhea.

## 2021-11-13 NOTE — ED Triage Notes (Signed)
Pt arrives via [facility] from home with c/o generalized weakness x2 days, along with a headache, cough fatigue, poor appetite/oral intake.

## 2021-11-13 NOTE — ED Notes (Signed)
Pt requesting medicine for headache 5/10, EDP made aware

## 2021-11-13 NOTE — ED Notes (Signed)
Pts pulse stayed at 98 while ambulating and her o2 stayed at 94. Nurse notified

## 2021-11-13 NOTE — ED Notes (Signed)
SATURATION QUALIFICATIONS: (This note is used to comply with regulatory documentation for home oxygen)  Patient Saturations on Room Air at Rest = 94%  Patient Saturations on Room Air while Ambulating = 94%

## 2021-11-13 NOTE — ED Notes (Signed)
Pt noted with steady gait while ambulating with cane. PA made aware

## 2021-11-13 NOTE — ED Notes (Signed)
Tolerates po fluids and food.

## 2021-11-14 ENCOUNTER — Encounter (HOSPITAL_COMMUNITY): Payer: Self-pay | Admitting: Emergency Medicine

## 2021-11-14 ENCOUNTER — Emergency Department (HOSPITAL_COMMUNITY)
Admission: EM | Admit: 2021-11-14 | Discharge: 2021-11-14 | Discharge status: Against Medical Advice | Payer: Medicare Other | Attending: Emergency Medicine | Admitting: Emergency Medicine

## 2021-11-14 ENCOUNTER — Other Ambulatory Visit: Payer: Self-pay

## 2021-11-14 DIAGNOSIS — R531 Weakness: Secondary | ICD-10-CM | POA: Insufficient documentation

## 2021-11-14 DIAGNOSIS — Z7982 Long term (current) use of aspirin: Secondary | ICD-10-CM | POA: Insufficient documentation

## 2021-11-14 DIAGNOSIS — Z91040 Latex allergy status: Secondary | ICD-10-CM | POA: Insufficient documentation

## 2021-11-14 DIAGNOSIS — Z79899 Other long term (current) drug therapy: Secondary | ICD-10-CM | POA: Diagnosis not present

## 2021-11-14 NOTE — ED Notes (Signed)
Pt was here yesterday, and on 12/24 and 12/20 for same. Diagnosed with Covid on 12/24.

## 2021-11-14 NOTE — ED Triage Notes (Signed)
Seem here in ED about 3-4 days ago.

## 2021-11-14 NOTE — ED Notes (Signed)
Pt wants to leave AMA, states her ride is on their way. Pt is ao x 4 at this time. MD notified.

## 2021-11-14 NOTE — ED Provider Notes (Signed)
Melody Mitchell Provider Note   CSN: [id number] Arrival date & time: 11/14/21  1019     History  Chief Complaint  Patient presents with   Weakness    Generalized    Melody Mitchell is a 81 y.o. female.  Patient presents with weakness.  She has COVID from 8 days ago.  She was seen yesterday and discharged.  She states she continues to be weak  The history is provided by the patient and medical records. No language interpreter was used.  Weakness Severity:  Moderate Onset quality:  Sudden Timing:  Constant Progression:  Worsening Chronicity:  New Context: not alcohol use   Relieved by:  Nothing Worsened by:  Nothing Ineffective treatments:  None tried Associated symptoms: no abdominal pain, no chest pain, no cough, no diarrhea, no frequency, no headaches and no seizures       Home Medications Prior to Admission medications   Medication Sig Start Date End Date Taking? Authorizing Provider  acetaminophen (TYLENOL) 500 MG tablet Take 500 mg by mouth every 6 (six) hours as needed for mild pain or moderate pain.    [provider]  amitriptyline (ELAVIL) 25 MG tablet Take 25 mg by mouth at bedtime as needed for sleep.    [provider]  aspirin EC 325 MG tablet Take 325 mg by mouth daily. 03/20/20   [provider]  atorvastatin (LIPITOR) 40 MG tablet Take 1 tablet (40 mg total) by mouth every evening. 09/06/19 11/02/21  Roxan Hockey, MD  escitalopram (LEXAPRO) 10 MG tablet Take 1 tablet by mouth daily. 09/04/20   [provider]  esomeprazole (NEXIUM) 40 MG capsule Take 40 mg by mouth daily before breakfast.    [provider]  ezetimibe (ZETIA) 10 MG tablet Take 10 mg by mouth in the morning.  11/11/19   [provider]  fish oil-omega-3 fatty acids 1000 MG capsule Take 1 g by mouth in the morning.     [provider]  meclizine (ANTIVERT) 25 MG tablet Take 25 mg by mouth daily as needed. 08/07/20    [provider]  ondansetron (ZOFRAN-ODT) 4 MG disintegrating tablet 4mg  ODT q4 hours prn nausea/vomit 11/06/21   Milton Ferguson, MD  potassium chloride SA (KLOR-CON) 20 MEQ tablet Take 20 mEq by mouth 2 (two) times daily. 03/20/20   [provider]  VITAMIN B12 TR 1000 MCG TBCR Take 1 tablet by mouth in the morning.  11/11/19   [provider]  Vitamin D, Ergocalciferol, (DRISDOL) 1.25 MG (50000 UNIT) CAPS capsule Take 50,000 Units by mouth every 7 (seven) days. 07/09/21   [provider]      Allergies    Latex, Sulfa antibiotics, Other, and Pravastatin sodium    Review of Systems   Review of Systems  Constitutional:  Negative for appetite change and fatigue.  HENT:  Negative for congestion, ear discharge and sinus pressure.   Eyes:  Negative for discharge.  Respiratory:  Negative for cough.   Cardiovascular:  Negative for chest pain.  Gastrointestinal:  Negative for abdominal pain and diarrhea.  Genitourinary:  Negative for frequency and hematuria.  Musculoskeletal:  Negative for back pain.  Skin:  Negative for rash.  Neurological:  Positive for weakness. Negative for seizures and headaches.  Psychiatric/Behavioral:  Negative for hallucinations.    Physical Exam Updated Vital Signs BP 138/88    Pulse 89    Temp 98.8 F (37.1 C) (Oral)    Resp 17  Ht 5\' 4"  (1.626 m)    Wt 72.5 kg    SpO2 97%    BMI 27.44 kg/m  Physical Exam Vitals reviewed. Exam conducted with a chaperone present.  Constitutional:      Appearance: She is well-developed.  HENT:     Head: Normocephalic.     Nose: Nose normal.  Eyes:     General: No scleral icterus.    Conjunctiva/sclera: Conjunctivae normal.  Neck:     Thyroid: No thyromegaly.  Cardiovascular:     Rate and Rhythm: Normal rate and regular rhythm.     Heart sounds: No murmur heard.   No friction rub. No gallop.  Pulmonary:     Breath sounds: No stridor. No wheezing or rales.  Chest:     Chest wall: No  tenderness.  Abdominal:     General: There is no distension.     Tenderness: There is no abdominal tenderness. There is no rebound.  Musculoskeletal:        General: Normal range of motion.     Cervical back: Neck supple.  Lymphadenopathy:     Cervical: No cervical adenopathy.  Skin:    Findings: No erythema or rash.  Neurological:     Mental Status: She is alert and oriented to person, place, and time.     Motor: No abnormal muscle tone.     Coordination: Coordination normal.  Psychiatric:        Behavior: Behavior normal.    ED Results / Procedures / Treatments   Labs (all labs ordered are listed, but only abnormal results are displayed) Labs Reviewed  CBC WITH DIFFERENTIAL/PLATELET  COMPREHENSIVE METABOLIC PANEL  CK  URINALYSIS, ROUTINE W REFLEX MICROSCOPIC  TROPONIN I (HIGH SENSITIVITY)    EKG None  Radiology DG Chest Port 1 View  Result Date: 11/13/2021 CLINICAL DATA:  81 year old female with history of cough and generalized weakness for the past 2 days. EXAM: PORTABLE CHEST 1 VIEW COMPARISON:  Chest x-ray 11/20/2020. FINDINGS: Lung volumes are normal. No consolidative airspace disease. No pleural effusions. No pneumothorax. No pulmonary nodule or mass noted. Pulmonary vasculature and the cardiomediastinal silhouette are within normal limits. Atherosclerosis in the thoracic aorta. IMPRESSION: 1.  No radiographic evidence of acute cardiopulmonary disease. 2. Aortic atherosclerosis. Electronically Signed   By: Vinnie Langton M.D.   On: 11/13/2021 12:42    Procedures Procedures    Medications Ordered in ED Medications - No data to display  ED Course/ Medical Decision Making/ A&P                           Medical Decision Making  Patient with weakness.  Patient decided leave AMA This patient presents to the ED for concern of weakness, this involves an extensive number of treatment options, and is a complaint that carries with it a high risk of complications and  morbidity.  The differential diagnosis includes COVID-19 anemia other infections   Co morbidities that complicate the patient evaluation  Hypertension vertigo reflux   Additional history obtained:  Additional history obtained from old record External records from outside source obtained and reviewed including none   Lab Tests:  I Ordered, and personally interpreted labs.  The pertinent results include: None   Imaging Studies ordered:  I ordered imaging studies including done I independently visualized and interpreted imaging which showed no imaging I agree with the radiologist interpretation   Cardiac Monitoring:  The patient was maintained on a  cardiac monitor.  I personally viewed and interpreted the cardiac monitored which showed an underlying rhythm of: Sinus rhythm   Medicines ordered and prescription drug management:  I ordered medication including none for none Reevaluation of the patient after these medicines showed that the patient stayed the same I have reviewed the patients home medicines and have made adjustments as needed   Test Considered:  None   Critical Interventions:  None none   Consultations Obtained:  I requested consultation with the none,  and discussed lab and   Problem List / ED Course:  COVID hypertension vertigo   Reevaluation: Patient left AMA Social Determinants of Health:  none   Dispostion:  After consideration of the diagnostic results and the patients response to treatment, I feel that the patent would benefit from IV fluids but patient left AMA.         Final Clinical Impression(s) / ED Diagnoses Final diagnoses:  Weakness    Rx / DC Orders ED Discharge Orders     None         Milton Ferguson, MD 11/16/21 [phone]

## 2021-11-16 ENCOUNTER — Other Ambulatory Visit: Payer: Self-pay

## 2021-11-16 ENCOUNTER — Encounter (HOSPITAL_COMMUNITY): Payer: Self-pay | Admitting: Emergency Medicine

## 2021-11-16 ENCOUNTER — Emergency Department (HOSPITAL_COMMUNITY)
Admission: EM | Admit: 2021-11-16 | Discharge: 2021-11-16 | Disposition: A | Discharge status: Home / Self Care | Payer: Medicare Other | Attending: Emergency Medicine | Admitting: Emergency Medicine

## 2021-11-16 DIAGNOSIS — Z8616 Personal history of COVID-19: Secondary | ICD-10-CM | POA: Insufficient documentation

## 2021-11-16 DIAGNOSIS — Z91040 Latex allergy status: Secondary | ICD-10-CM | POA: Insufficient documentation

## 2021-11-16 DIAGNOSIS — R531 Weakness: Secondary | ICD-10-CM

## 2021-11-16 DIAGNOSIS — Z79899 Other long term (current) drug therapy: Secondary | ICD-10-CM | POA: Insufficient documentation

## 2021-11-16 DIAGNOSIS — Z87891 Personal history of nicotine dependence: Secondary | ICD-10-CM | POA: Diagnosis not present

## 2021-11-16 DIAGNOSIS — U071 COVID-19: Secondary | ICD-10-CM | POA: Diagnosis not present

## 2021-11-16 DIAGNOSIS — Z7982 Long term (current) use of aspirin: Secondary | ICD-10-CM | POA: Insufficient documentation

## 2021-11-16 DIAGNOSIS — R109 Unspecified abdominal pain: Secondary | ICD-10-CM | POA: Insufficient documentation

## 2021-11-16 DIAGNOSIS — I10 Essential (primary) hypertension: Secondary | ICD-10-CM | POA: Insufficient documentation

## 2021-11-16 LAB — CBC WITH DIFFERENTIAL/PLATELET
Abs Immature Granulocytes: 0.01 10*3/uL (ref 0.00–0.07)
Basophils Absolute: 0 10*3/uL (ref 0.0–0.1)
Basophils Relative: 0 %
Eosinophils Absolute: 0 10*3/uL (ref 0.0–0.5)
Eosinophils Relative: 1 %
HCT: 30.4 % — ABNORMAL LOW (ref 36.0–46.0)
Hemoglobin: 9.3 g/dL — ABNORMAL LOW (ref 12.0–15.0)
Immature Granulocytes: 0 %
Lymphocytes Relative: 21 %
Lymphs Abs: 0.9 10*3/uL (ref 0.7–4.0)
MCH: 28.8 pg (ref 26.0–34.0)
MCHC: 30.6 g/dL (ref 30.0–36.0)
MCV: 94.1 fL (ref 80.0–100.0)
Monocytes Absolute: 0.4 10*3/uL (ref 0.1–1.0)
Monocytes Relative: 8 %
Neutro Abs: 3 10*3/uL (ref 1.7–7.7)
Neutrophils Relative %: 70 %
Platelets: 383 10*3/uL (ref 150–400)
RBC: 3.23 MIL/uL — ABNORMAL LOW (ref 3.87–5.11)
RDW: 14.7 % (ref 11.5–15.5)
WBC: 4.3 10*3/uL (ref 4.0–10.5)
nRBC: 0 % (ref 0.0–0.2)

## 2021-11-16 LAB — URINALYSIS, MICROSCOPIC (REFLEX): RBC / HPF: NONE SEEN RBC/hpf (ref 0–5)

## 2021-11-16 LAB — URINALYSIS, ROUTINE W REFLEX MICROSCOPIC
Bilirubin Urine: NEGATIVE
Glucose, UA: NEGATIVE mg/dL
Hgb urine dipstick: NEGATIVE
Ketones, ur: NEGATIVE mg/dL
Nitrite: NEGATIVE
Protein, ur: NEGATIVE mg/dL
Specific Gravity, Urine: 1.01 (ref 1.005–1.030)
pH: 7 (ref 5.0–8.0)

## 2021-11-16 LAB — COMPREHENSIVE METABOLIC PANEL
ALT: 13 U/L (ref 0–44)
AST: 20 U/L (ref 15–41)
Albumin: 3.8 g/dL (ref 3.5–5.0)
Alkaline Phosphatase: 62 U/L (ref 38–126)
Anion gap: 10 (ref 5–15)
BUN: 7 mg/dL — ABNORMAL LOW (ref 8–23)
CO2: 25 mmol/L (ref 22–32)
Calcium: 9.4 mg/dL (ref 8.9–10.3)
Chloride: 105 mmol/L (ref 98–111)
Creatinine, Ser: 0.85 mg/dL (ref 0.44–1.00)
GFR, Estimated: >60 mL/min (ref >60)
Glucose, Bld: 88 mg/dL (ref 70–99)
Potassium: 4 mmol/L (ref 3.5–5.1)
Sodium: 140 mmol/L (ref 135–145)
Total Bilirubin: 0.3 mg/dL (ref 0.3–1.2)
Total Protein: 7.4 g/dL (ref 6.5–8.1)

## 2021-11-16 LAB — TROPONIN I (HIGH SENSITIVITY)
Troponin I (High Sensitivity): 4 ng/L (ref <18)
Troponin I (High Sensitivity): 5 ng/L (ref <18)

## 2021-11-16 LAB — MAGNESIUM: Magnesium: 2.3 mg/dL (ref 1.7–2.4)

## 2021-11-16 LAB — TSH: TSH: 2.431 u[IU]/mL (ref 0.350–4.500)

## 2021-11-16 MED ORDER — ACETAMINOPHEN 500 MG PO TABS
500.0000 mg | ORAL_TABLET | Freq: Once | ORAL | Status: AC
Start: 2021-11-16 — End: 2021-11-16
  Administered 2021-11-16: 500 mg via ORAL
  Filled 2021-11-16: qty 1

## 2021-11-16 MED ORDER — SODIUM CHLORIDE 0.9 % IV BOLUS
500.0000 mL | Freq: Once | INTRAVENOUS | Status: AC
  Administered 2021-11-16: 500 mL via INTRAVENOUS

## 2021-11-16 NOTE — ED Notes (Signed)
Pt able to ambulate to BR across the hallway from pt's room.

## 2021-11-16 NOTE — ED Provider Notes (Signed)
[facility] EMERGENCY DEPARTMENT Provider Note   CSN: [id number] Arrival date & time: 11/16/21  1002     History  Chief Complaint  Patient presents with   Abdominal Pain    Melody Mitchell is a 81 y.o. female.   Abdominal Pain Associated symptoms: no chest pain, no chills, no diarrhea, no fatigue, no fever, no nausea, no shortness of breath and no vomiting       Melody Mitchell is a 81 y.o. female with past medical history significant for iron deficient anemia, acid reflux, hypertension, who presents to the Emergency Department complaining of generalized weakness and headache.  Patient was seen here on 11/14/2021 for same and left AMA.  States that she left on her previous visit before completion of her work-up because she felt better and felt she no longer needed to be in the emergency department.  She is continued to have generalized weakness.  She tested positive for COVID on 11/06/2022 but does not feel that she is improved from her illness.  She describes having a mild diffuse headache and feels so weak that she is unable to perform her ADLs.  She denies abdominal pain, chest pain, shortness of breath, vomiting or diarrhea, dysuria fever or chills.  Home Medications Prior to Admission medications   Medication Sig Start Date End Date Taking? Authorizing Provider  acetaminophen (TYLENOL) 500 MG tablet Take 500 mg by mouth every 6 (six) hours as needed for mild pain or moderate pain.    [provider]  amitriptyline (ELAVIL) 25 MG tablet Take 25 mg by mouth at bedtime as needed for sleep.    [provider]  aspirin EC 325 MG tablet Take 325 mg by mouth daily. 03/20/20   [provider]  atorvastatin (LIPITOR) 40 MG tablet Take 1 tablet (40 mg total) by mouth every evening. 09/06/19 11/02/21  Roxan Hockey, MD  escitalopram (LEXAPRO) 10 MG tablet Take 1 tablet by mouth daily. 09/04/20   [provider]  esomeprazole (NEXIUM) 40 MG capsule Take 40 mg by  mouth daily before breakfast.    [provider]  ezetimibe (ZETIA) 10 MG tablet Take 10 mg by mouth in the morning.  11/11/19   [provider]  fish oil-omega-3 fatty acids 1000 MG capsule Take 1 g by mouth in the morning.     [provider]  meclizine (ANTIVERT) 25 MG tablet Take 25 mg by mouth daily as needed. 08/07/20   [provider]  ondansetron (ZOFRAN-ODT) 4 MG disintegrating tablet 4mg  ODT q4 hours prn nausea/vomit 11/06/21   Milton Ferguson, MD  potassium chloride SA (KLOR-CON) 20 MEQ tablet Take 20 mEq by mouth 2 (two) times daily. 03/20/20   [provider]  VITAMIN B12 TR 1000 MCG TBCR Take 1 tablet by mouth in the morning.  11/11/19   [provider]  Vitamin D, Ergocalciferol, (DRISDOL) 1.25 MG (50000 UNIT) CAPS capsule Take 50,000 Units by mouth every 7 (seven) days. 07/09/21   [provider]     Surgical history: Abdominal hysterectomy, appendectomy, cholecystectomy  Social history includes former tobacco user, no other pertinent social history  Social determinants including patient lives alone.   Allergies    Latex, Sulfa antibiotics, Other, and Pravastatin sodium    Review of Systems   Review of Systems  Constitutional:  Negative for appetite change, chills, fatigue and fever.  Respiratory:  Negative for shortness of breath.   Cardiovascular:  Negative for chest pain.  Gastrointestinal:  Negative for abdominal pain, diarrhea, nausea and vomiting.  Genitourinary:  Negative for difficulty urinating.  Neurological:  Positive for weakness and headaches. Negative for dizziness, seizures, syncope and speech difficulty.  All other systems reviewed and are negative.  Physical Exam Updated Vital Signs Ht 5\' 4"  (1.626 m)    Wt 72.5 kg    BMI 27.44 kg/m  Physical Exam Vitals and nursing note reviewed.  Constitutional:      General: She is not in acute distress.    Appearance: She is well-developed. She is not  ill-appearing or toxic-appearing.  Eyes:     Extraocular Movements: Extraocular movements intact.     Pupils: Pupils are equal, round, and reactive to light.  Neck:     Thyroid: No thyromegaly.     Trachea: Phonation normal.  Cardiovascular:     Rate and Rhythm: Normal rate and regular rhythm.     Pulses: Normal pulses.  Pulmonary:     Effort: Pulmonary effort is normal. No respiratory distress.     Breath sounds: Normal breath sounds.  Chest:     Chest wall: No tenderness.  Abdominal:     General: There is no distension.     Palpations: Abdomen is soft.     Tenderness: There is no abdominal tenderness. There is no guarding or rebound.  Musculoskeletal:        General: Normal range of motion.     Cervical back: Full passive range of motion without pain.     Right lower leg: No edema.     Left lower leg: No edema.  Skin:    General: Skin is warm.     Capillary Refill: Capillary refill takes less than 2 seconds.  Neurological:     General: No focal deficit present.     Mental Status: She is alert.     Sensory: No sensory deficit.     Motor: No weakness.    ED Results / Procedures / Treatments   Labs (all labs ordered are listed, but only abnormal results are displayed) Labs Reviewed  COMPREHENSIVE METABOLIC PANEL - Abnormal; Notable for the following components:      Result Value   BUN 7 (*)    All other components within normal limits  CBC WITH DIFFERENTIAL/PLATELET - Abnormal; Notable for the following components:   RBC 3.23 (*)    Hemoglobin 9.3 (*)    HCT 30.4 (*)    All other components within normal limits  URINALYSIS, ROUTINE W REFLEX MICROSCOPIC - Abnormal; Notable for the following components:   Leukocytes,Ua TRACE (*)    All other components within normal limits  URINALYSIS, MICROSCOPIC (REFLEX) - Abnormal; Notable for the following components:   Bacteria, UA FEW (*)    Non Squamous Epithelial PRESENT (*)    All other components within normal limits   MAGNESIUM  TSH  TROPONIN I (HIGH SENSITIVITY)  TROPONIN I (HIGH SENSITIVITY)    EKG EKG Interpretation  Date/Time:  Tuesday November 16 2021 11:32:08 EST Ventricular Rate:  78 PR Interval:  155 QRS Duration: 90 QT Interval:  383 QTC Calculation: 437 R Axis:   -18 Text Interpretation: Sinus rhythm Borderline left axis deviation Low voltage, precordial leads Abnormal R-wave progression, early transition Borderline T abnormalities, anterior leads No significant change since last tracing Confirmed by Aletta Edouard [phone]) on 11/16/2021 12:04:42 PM  Radiology No results found.  Procedures Procedures    Medications Ordered in ED Medications - No data to display  ED Course/ Medical Decision  Making/ A&P Clinical Course as of 11/17/21 1214  Tue Nov 16, 1088  3352 81 year old female here with complaint of abdominal pain and headache.  Seen here few days ago for same.  Recent COVID infection.  Otherwise nontoxic-appearing lab work fairly unremarkable.  Will p.o. trial.  Consider getting her set up with home health as lives alone. [MB]    Clinical Course User Index [MB] Hayden Rasmussen, MD                           Medical Decision Making   Patient here for complaint of generalized weakness.  Seen here multiple times for similar symptoms.  Diagnosed with COVID-19 on 11/06/2021 seen here 2 days ago for same and left AMA.  On exam, patient elderly appearing mildly dry mucous membranes.  She is nontoxic-appearing.  No peripheral edema, she denies abdominal tenderness on my exam no peritoneal signs.  Source of patient's generalized weakness unclear at this time.  She does not appear critically ill.  Differential would include dehydration, acute blood loss, TIA/stroke, thyroid abnormality, cardiac process  Work-up today will include laboratory studies, EKG, urinalysis, no chest pain or cough to indicate need for chest imaging, no abdominal pain on my exam to suggest need for abdominal  imaging at this time.  On recheck, patient resting comfortably.  Vital signs reviewed.  I have reviewed prior medical records including history and laboratory studies and imaging.  Patient was noted to be COVID-positive on 11/06/2021.  She has been seen here multiple times for generalized weakness.  I suspect her weakness is secondary to her recent COVID illness.  Chest x-ray reviewed by me, from 11/13/2021 was negative for acute process.  Patient has ambulated in the department, gait steady.  There is no focal neurodeficits on my exam.  She is requesting discharge home.  Patient does live alone.  She may benefit from home health.  Will contact social work for [employer].  Discussed this with patient and she is agreeable to this care plan.  Patient has remained stable throughout her ER stay, no concerning symptoms for emergent process.  I have spoken with social work and arrangements to be made for home health PT and OT assessment.  Patient appears appropriate for discharge at this time she agrees to plan.  Return precautions were discussed all questions were answered.        Final Clinical Impression(s) / ED Diagnoses Final diagnoses:  Generalized weakness    Rx / DC Orders ED Discharge Orders     None         Kem Parkinson, PA-C 11/17/21 1220    Hayden Rasmussen, MD 11/17/21 1757

## 2021-11-16 NOTE — ED Notes (Signed)
Pt ambulated back to room without difficultly.  States HA has eased to 1/10

## 2021-11-16 NOTE — ED Notes (Signed)
ED Provider  Dr. Melina Copa at bedside.

## 2021-11-16 NOTE — Discharge Instructions (Signed)
Someone should be contacting you to arrange for home health to come out to assist you with physical therapy.  Please follow-up with your primary care provider for recheck.  Return to emergency department for any new or worsening symptoms.

## 2021-11-16 NOTE — ED Triage Notes (Addendum)
Pt to the ED [facility] for abdominal pain and a headache. Pt seen here for the same 11/14/21.  Pt tested positive for Covid on 11/06/22.

## 2021-11-16 NOTE — ED Notes (Signed)
TOC updated that pt was in need of [facility] services. CSW spoke with pt about interest in [facility] services. Pt is agreeable and does not have a preference in company. CSW reached out to [facility] and they cannot accept at this time due to staffing. CSW spoke with [facility] and they have not responded as to if they can accept. CSW also reached out to [facility] with [place], awaiting to see if they can accept.

## 2021-11-16 NOTE — ED Notes (Signed)
Pt ambulated to BR

## 2021-11-16 NOTE — ED Notes (Signed)
Pt ambulated with her cane back to the room without difficultly.

## 2021-11-19 NOTE — ED Notes (Signed)
Vital signs stable during triage. Vitals were obtained using a dinamap and failed to crossover to epic.

## 2021-12-20 ENCOUNTER — Other Ambulatory Visit: Payer: Self-pay

## 2021-12-20 ENCOUNTER — Inpatient Hospital Stay (HOSPITAL_COMMUNITY): Payer: Medicare Other | Attending: Hematology

## 2021-12-20 DIAGNOSIS — Z90710 Acquired absence of both cervix and uterus: Secondary | ICD-10-CM | POA: Diagnosis not present

## 2021-12-20 DIAGNOSIS — Z87891 Personal history of nicotine dependence: Secondary | ICD-10-CM | POA: Diagnosis not present

## 2021-12-20 DIAGNOSIS — I10 Essential (primary) hypertension: Secondary | ICD-10-CM | POA: Insufficient documentation

## 2021-12-20 DIAGNOSIS — D509 Iron deficiency anemia, unspecified: Secondary | ICD-10-CM | POA: Insufficient documentation

## 2021-12-20 DIAGNOSIS — D500 Iron deficiency anemia secondary to blood loss (chronic): Secondary | ICD-10-CM

## 2021-12-20 DIAGNOSIS — E559 Vitamin D deficiency, unspecified: Secondary | ICD-10-CM | POA: Diagnosis not present

## 2021-12-20 DIAGNOSIS — E538 Deficiency of other specified B group vitamins: Secondary | ICD-10-CM

## 2021-12-20 LAB — CBC WITH DIFFERENTIAL/PLATELET
Abs Immature Granulocytes: 0.01 10*3/uL (ref 0.00–0.07)
Basophils Absolute: 0 10*3/uL (ref 0.0–0.1)
Basophils Relative: 0 %
Eosinophils Absolute: 0.1 10*3/uL (ref 0.0–0.5)
Eosinophils Relative: 2 %
HCT: 29.4 % — ABNORMAL LOW (ref 36.0–46.0)
Hemoglobin: 8.9 g/dL — ABNORMAL LOW (ref 12.0–15.0)
Immature Granulocytes: 0 %
Lymphocytes Relative: 19 %
Lymphs Abs: 0.9 10*3/uL (ref 0.7–4.0)
MCH: 27.9 pg (ref 26.0–34.0)
MCHC: 30.3 g/dL (ref 30.0–36.0)
MCV: 92.2 fL (ref 80.0–100.0)
Monocytes Absolute: 0.5 10*3/uL (ref 0.1–1.0)
Monocytes Relative: 9 %
Neutro Abs: 3.6 10*3/uL (ref 1.7–7.7)
Neutrophils Relative %: 70 %
Platelets: 364 10*3/uL (ref 150–400)
RBC: 3.19 MIL/uL — ABNORMAL LOW (ref 3.87–5.11)
RDW: 15.1 % (ref 11.5–15.5)
WBC: 5.1 10*3/uL (ref 4.0–10.5)
nRBC: 0 % (ref 0.0–0.2)

## 2021-12-20 LAB — VITAMIN B12: Vitamin B-12: 776 pg/mL (ref 180–914)

## 2021-12-20 LAB — FOLATE: Folate: 6.6 ng/mL (ref >5.9)

## 2021-12-20 LAB — FERRITIN: Ferritin: 9 ng/mL — ABNORMAL LOW (ref 11–307)

## 2021-12-20 LAB — IRON AND TIBC
Iron: 25 ug/dL — ABNORMAL LOW (ref 28–170)
Saturation Ratios: 7 % — ABNORMAL LOW (ref 10.4–31.8)
TIBC: 352 ug/dL (ref 250–450)
UIBC: 327 ug/dL

## 2021-12-21 LAB — VITAMIN D 25 HYDROXY (VIT D DEFICIENCY, FRACTURES): Vit D, 25-Hydroxy: 142.55 ng/mL — ABNORMAL HIGH (ref 30–100)

## 2021-12-25 LAB — METHYLMALONIC ACID, SERUM: Methylmalonic Acid, Quantitative: 114 nmol/L (ref 0–378)

## 2021-12-27 NOTE — Progress Notes (Deleted)
NO SHOW

## 2021-12-28 ENCOUNTER — Inpatient Hospital Stay (HOSPITAL_COMMUNITY): Payer: Medicare Other | Admitting: Physician Assistant

## 2021-12-31 NOTE — Progress Notes (Signed)
Melody Mitchell, Page [phone]   CLINIC:  Medical Oncology/Hematology  PCP:  [name], MD [address] [place] [id number] [phone]   REASON FOR VISIT:  Follow-up for iron deficiency anemia   CURRENT THERAPY: Intermittent IV iron infusions (most recently 07/22/2021 & 07/29/2021)  INTERVAL HISTORY:  Melody Mitchell 81 y.o. female returns for routine follow-up of her iron deficiency anemia.  She was last evaluated via telemedicine visit by Tarri Abernethy PA-C on 09/14/2021.  Her last IV iron was [name] x2 on 07/22/2021 and 07/29/2021.   At today's visit, she reports feeling fair . She had recent COVID-19 infection, tested positive on 11/06/2021.    She has a history of AVMs and chronic GI blood loss, and continues to report intermittent dark stools, although less frequently than before, now occurring less than once per month.  She denies any other bleeding such as epistaxis, hematemesis, hematuria, or hematochezia.  She is symptomatic with significant fatigue.  She denies any pica, restless legs, headaches, chest pain, dyspnea on exertion, lightheadedness, or syncope.  She is not currently scheduled for any GI follow-up, but was previously seen by Neil Crouch, PA-C at [facility].  She does complain of some decreased appetite and has lost 15 to 20 pounds in the past few months.  She has 70% energy and 75% appetite. She endorses that she is maintaining a stable weight.   REVIEW OF SYSTEMS:  Review of Systems  Constitutional:  Positive for appetite change, fatigue and unexpected weight change. Negative for chills, diaphoresis and fever.  HENT:   Negative for lump/mass and nosebleeds.   Eyes:  Negative for eye problems.  Respiratory:  Negative for cough, hemoptysis and shortness of breath.   Cardiovascular:  Negative for chest pain, leg swelling and palpitations.  Gastrointestinal:  Positive for diarrhea.  Negative for abdominal pain, blood in stool, constipation, nausea and vomiting.  Genitourinary:  Negative for hematuria.   Musculoskeletal:  Positive for arthralgias.  Skin: Negative.   Neurological:  Negative for dizziness, headaches and light-headedness.  Hematological:  Does not bruise/bleed easily.     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 four colon polyps removed. No source  for change in bowel habits identified 3. Rectal bleeding due to large internal hemorrhoids    COLONOSCOPY W/ BIOPSIES  6 2010   Dr. Oneida Alar: Polypoid appearing lesion of the ascending colon, 3 mm sessile rectal polyp, small internal hemorrhoids. Pathology revealed polypoid mucosa, no adenomatous changes   Double-balloon enteroscopy, antegrade  April 2011   Dr. 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   ESOPHAGOGASTRODUODENOSCOPY  04/2009   Dr. Oneida Alar: Patent Schatzki ring dilated with  advancing the scope, patchy erythema in the antrum, 2 small AVMs in the duodenal bulb, 2 additional AVMs noted in the second portion the duodenum, mild gastritis on pathology   ESOPHAGOGASTRODUODENOSCOPY N/A 06/29/2015   SLF: 1. 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 small hiatal hernia 3. Mild non-erosive gastririts- NO obvious source for dyspepsia identified.     ESOPHAGOGASTRODUODENOSCOPY N/A 03/13/2020   Fields: Low-grade narrowing Schatzki ring, small hiatal hernia, multiple nonbleeding angiectasia's in the jejunum treated with APC.  Spot tattoo in jejunum at 90 cm from the incisors.   EYE SURGERY       SOCIAL HISTORY:  Social History   Socioeconomic History   Marital status: Divorced    Spouse name: Not on file   Number of children: 1   Years of education: Not on file   Highest education level: Not on file  Occupational History   Not on file  Tobacco Use   Smoking status: Former    Packs/day: 1.00    Years: 25.00    Pack years: 25.00    Types: Cigarettes    Quit date: 09/14/2007    Years since quitting: 14.3   Smokeless tobacco: Never  Vaping Use   Vaping Use: Never used  Substance and Sexual Activity   Alcohol use: Not Currently    Comment: occasional/rare (Brandy every now and then)   Drug use: No   Sexual activity: Yes    Birth control/protection: Post-menopausal, Surgical  Other Topics Concern   Not on file  Social History Narrative   Not on file   Social Determinants of Health   Financial Resource Strain: Not on file  Food Insecurity: Not on file  Transportation Needs: Not on file  Physical Activity: Not on file  Stress: Not on file  Social Connections: Not on file  Intimate Partner Violence: Not on file    FAMILY HISTORY:  Family History  Problem Relation Age of Onset   Heart failure Mother    Diabetes Sister    Heart attack Father    Colon cancer Neg Hx    Gastric cancer Neg Hx    Esophageal cancer Neg Hx     CURRENT MEDICATIONS:  Outpatient Encounter Medications as of 01/03/2022  Medication Sig Note   acetaminophen (TYLENOL) 500 MG tablet Take 500 mg by mouth every 6 (six) hours as needed for mild pain or moderate pain.    amitriptyline (ELAVIL) 25 MG tablet Take 25 mg by mouth at bedtime as needed for sleep.    aspirin EC 325 MG tablet Take 325 mg by mouth daily.    atorvastatin (LIPITOR) 40 MG  tablet Take 1 tablet (40 mg total) by mouth every evening.    escitalopram (LEXAPRO) 10 MG tablet Take 1 tablet by mouth daily.    esomeprazole (NEXIUM) 40 MG capsule Take 40 mg by mouth daily before breakfast.    ezetimibe (ZETIA) 10 MG tablet Take 10 mg by mouth in the morning.     fish oil-omega-3 fatty acids 1000 MG capsule Take 1 g by mouth in the morning.     meclizine (ANTIVERT) 25 MG tablet Take 25 mg by mouth daily as needed.    ondansetron (ZOFRAN-ODT) 4 MG disintegrating tablet 4mg  ODT q4 hours prn nausea/vomit    potassium chloride SA (KLOR-CON) 20 MEQ tablet Take 20 mEq by mouth 2 (two) times daily.    VITAMIN B12 TR 1000 MCG TBCR Take 1 tablet by mouth in the morning.  Vitamin D, Ergocalciferol, (DRISDOL) 1.25 MG (50000 UNIT) CAPS capsule Take 50,000 Units by mouth every 7 (seven) days. 11/02/2021: Sunday    No facility-administered encounter medications on file as of 01/03/2022.    ALLERGIES:  Allergies  Allergen Reactions   Latex Hives, Itching and Rash   Sulfa Antibiotics Hives and Itching   Other Hives   Pravastatin Sodium Other (See Comments)     PHYSICAL EXAM:  ECOG PERFORMANCE STATUS: 1 - Symptomatic but completely ambulatory  There were no vitals filed for this visit. There were no vitals filed for this visit. Physical Exam Constitutional:      Appearance: Normal appearance.  HENT:     Head: Normocephalic and atraumatic.     Mouth/Throat:     Mouth: Mucous membranes are moist.  Eyes:     Extraocular Movements: Extraocular movements intact.     Pupils: Pupils are equal, round, and reactive to light.  Cardiovascular:     Rate and Rhythm: Normal rate and regular rhythm.     Pulses: Normal pulses.     Heart sounds: Normal heart sounds.  Pulmonary:     Effort: Pulmonary effort is normal.     Breath sounds: Normal breath sounds.  Abdominal:     General: Bowel sounds are normal.     Palpations: Abdomen is soft.     Tenderness: There is no abdominal  tenderness.  Musculoskeletal:        General: No swelling.     Right lower leg: No edema.     Left lower leg: No edema.  Lymphadenopathy:     Cervical: No cervical adenopathy.  Skin:    General: Skin is warm and dry.  Neurological:     General: No focal deficit present.     Mental Status: She is alert and oriented to person, place, and time.  Psychiatric:        Mood and Affect: Mood normal.        Behavior: Behavior normal.     LABORATORY DATA:  I have reviewed the labs as listed.  CBC    Component Value Date/Time   WBC 5.1 12/20/2021 1156   RBC 3.19 (L) 12/20/2021 1156   HGB 8.9 (L) 12/20/2021 1156   HCT 29.4 (L) 12/20/2021 1156   PLT 364 12/20/2021 1156   MCV 92.2 12/20/2021 1156   MCH 27.9 12/20/2021 1156   MCHC 30.3 12/20/2021 1156   RDW 15.1 12/20/2021 1156   LYMPHSABS 0.9 12/20/2021 1156   MONOABS 0.5 12/20/2021 1156   EOSABS 0.1 12/20/2021 1156   BASOSABS 0.0 12/20/2021 1156   CMP Latest Ref Rng & Units 11/16/2021 11/13/2021 11/06/2021  Glucose 70 - 99 mg/dL 88 82 91  BUN 8 - 23 mg/dL 7(L) 9 7(L)  Creatinine 0.44 - 1.00 mg/dL 0.85 0.87 0.84  Sodium 135 - 145 mmol/L 140 140 137  Potassium 3.5 - 5.1 mmol/L 4.0 4.1 3.7  Chloride 98 - 111 mmol/L 105 105 103  CO2 22 - 32 mmol/L 25 27 25   Calcium 8.9 - 10.3 mg/dL 9.4 8.9 9.0  Total Protein 6.5 - 8.1 g/dL 7.4 - 7.4  Total Bilirubin 0.3 - 1.2 mg/dL 0.3 - 0.4  Alkaline Phos 38 - 126 U/L 62 - 76  AST 15 - 41 U/L 20 - 29  ALT 0 - 44 U/L 13 - 17    DIAGNOSTIC IMAGING:  I have independently reviewed the relevant imaging and discussed with the patient.  ASSESSMENT & PLAN: 1.  Iron  deficiency anemia: - This is from small bowel AVMs, as evidenced by EGD in 2011.  She also had a EGD and colonoscopy in 2016 did not show any evidence of active bleeding areas, however she had large internal hemorrhoids. 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  Renal function normal for age. - Last [name] infusion on 07/22/2021 and 07/29/2021 - Most recent labs (12/20/2021): Hgb 8.9/MCV 92.2, ferritin 9, iron saturation 7% - She continues to report dark stools, about once per month - She is symptomatic with significant fatigue -She was last seen by GI in September 2022, was instructed to follow-up in 6 months but does not currently have any upcoming scheduled GI appointments - PLAN: Recommend IV [name] x2. - Repeat labs and RTC in 2 months. - Instructions and information given for patient to call her GI office to schedule follow-up   2.  Vitamin D deficiency: - She was instructed at last appointment to stop taking vitamin D due to elevated levels, but she does not think that she did. - Most recent vitamin D (12/20/2021) remains elevated at 142.55 - PLAN: Patient instructed to STOP taking vitamin D.   We will restart her on the lower dose when appropriate.  Recheck vitamin D level in 3-6 months (May-August 2023)   3.  Borderline vitamin B12 deficiency: - Labs on 12/20/2021 show vitamin B12 at 776 with normal methylmalonic acid - She is currently taking B12 pill once daily - PLAN: Continue vitamin B12 supplement.  We will check B12 and methylmalonic acid in 6 months (August 2023).  4.  Poor appetite and weight loss - Reports decreased appetite for the past month - Weight in September 2022 was 147 pounds, up to 159 pounds on 11/16/2021. - Weight today is 138 pounds (20 pound weight loss in the past 6 weeks) - PLAN: Patient instructed to start drinking 1-2 Ensure/boost beverages daily. - We will check weight at follow-up appointment in 2 months. - If further weight loss, will consider appetite stimulant, dietitian  referral, and/or additional work-up.   PLAN SUMMARY & DISPOSITION: IV [name] x2 doses Same-day labs and RTC in 2 months  All questions were answered. The patient knows to call the clinic with any problems, questions or concerns.  Medical decision making: Moderate  Time spent on visit: I spent 20 minutes counseling the patient face to face. The total time spent in the appointment was 30 minutes and more than 50% was on counseling.   Harriett Rush, PA-C  01/03/2022 10:34 AM

## 2022-01-03 ENCOUNTER — Other Ambulatory Visit: Payer: Self-pay

## 2022-01-03 ENCOUNTER — Inpatient Hospital Stay (HOSPITAL_BASED_OUTPATIENT_CLINIC_OR_DEPARTMENT_OTHER): Payer: Medicare Other | Admitting: Physician Assistant

## 2022-01-03 VITALS — BP 118/84 | HR 79 | Temp 97.2°F | Resp 18 | Ht 62.8 in | Wt 138.9 lb

## 2022-01-03 DIAGNOSIS — E538 Deficiency of other specified B group vitamins: Secondary | ICD-10-CM | POA: Diagnosis not present

## 2022-01-03 DIAGNOSIS — D509 Iron deficiency anemia, unspecified: Secondary | ICD-10-CM | POA: Diagnosis not present

## 2022-01-03 DIAGNOSIS — D500 Iron deficiency anemia secondary to blood loss (chronic): Secondary | ICD-10-CM | POA: Diagnosis not present

## 2022-01-03 DIAGNOSIS — E559 Vitamin D deficiency, unspecified: Secondary | ICD-10-CM | POA: Diagnosis not present

## 2022-01-03 NOTE — Patient Instructions (Addendum)
[place] at [facility] Discharge Instructions  You were seen today by Tarri Abernethy PA-C for your iron deficiency anemia.  Your blood and iron levels are low, so we will schedule you for IV IRON x2 doses.  You should also continue to follow-up with your gastroenterologist [facility] doctor) for ongoing monitoring and management of your intestinal blood loss. ** Call your GI office [facility] at [phone] to schedule a follow up appointment with Neil Crouch, PA-C.  You should continue to take vitamin B12 supplement once daily.  Your vitamin D levels are too high.  Just as a reminder, you should NOT be taking any vitamin D supplement or multivitamin at this time.  You should START drinking Ensure (or Boost) 1-2 drinks daily due to your decreased appetite and weight loss.    LABS: Return in 2 months for repeat labs  FOLLOW-UP APPOINTMENT: Office visit in 2 months, same day as labs   Thank you for choosing [place] at [facility] to provide your oncology and hematology care.  To afford each patient quality time with our provider, please arrive at least 15 minutes before your scheduled appointment time.   If you have a lab appointment with the [place] please come in thru the Main Entrance and check in at the main information desk.  You need to re-schedule your appointment should you arrive 10 or more minutes late.  We strive to give you quality time with our providers, and arriving late affects you and other patients whose appointments are after yours.  Also, if you no show three or more times for appointments you may be dismissed from the clinic at the providers discretion.     Again, thank you for choosing [facility].  Our hope is that these requests will decrease the amount of time that you wait before being seen by our physicians.        _____________________________________________________________  Should you have questions after your visit to [facility], please contact our office at [phone] and follow the prompts.  Our office hours are 8:00 a.m. and 4:30 p.m. Monday - Friday.  Please note that voicemails left after 4:00 p.m. may not be returned until the following business day.  We are closed weekends and major holidays.  You do have access to a nurse 24-7, just call the main number to the clinic [phone] and do not press any options, hold on the line and a nurse will answer the phone.    For prescription refill requests, have your pharmacy contact our office and allow 72 hours.    Due to Covid, you will need to wear a mask upon entering the hospital. If you do not have a mask, a mask will be given to you at the Main Entrance upon arrival. For doctor visits, patients may have 1 support person age 80 or older with them. For treatment visits, patients can not have anyone with them due to social distancing guidelines and our immunocompromised population.

## 2022-01-06 ENCOUNTER — Inpatient Hospital Stay (HOSPITAL_COMMUNITY): Payer: Medicare Other

## 2022-01-06 ENCOUNTER — Other Ambulatory Visit: Payer: Self-pay

## 2022-01-06 VITALS — BP 91/63 | HR 68 | Temp 98.5°F | Resp 18

## 2022-01-06 DIAGNOSIS — D509 Iron deficiency anemia, unspecified: Secondary | ICD-10-CM | POA: Diagnosis not present

## 2022-01-06 DIAGNOSIS — D500 Iron deficiency anemia secondary to blood loss (chronic): Secondary | ICD-10-CM

## 2022-01-06 MED ORDER — SODIUM CHLORIDE 0.9 % IV SOLN
510.0000 mg | Freq: Once | INTRAVENOUS | Status: AC
  Administered 2022-01-06: 510 mg via INTRAVENOUS
  Filled 2022-01-06: qty 510

## 2022-01-06 MED ORDER — SODIUM CHLORIDE 0.9 % IV SOLN: Freq: Once | INTRAVENOUS | Status: AC

## 2022-01-06 MED ORDER — ACETAMINOPHEN 325 MG PO TABS
650.0000 mg | ORAL_TABLET | Freq: Once | ORAL | Status: AC
  Administered 2022-01-06: 650 mg via ORAL
  Filled 2022-01-06: qty 2

## 2022-01-06 MED ORDER — LORATADINE 10 MG PO TABS
10.0000 mg | ORAL_TABLET | Freq: Once | ORAL | Status: AC
  Administered 2022-01-06: 10 mg via ORAL
  Filled 2022-01-06: qty 1

## 2022-01-06 NOTE — Progress Notes (Signed)
Patient tolerated iron infusion with no complaints voiced.  Peripheral IV site clean and dry with good blood return noted before and after infusion.  Band aid applied.  VSS with discharge and left in satisfactory condition with no s/s of distress noted.   

## 2022-01-06 NOTE — Patient Instructions (Signed)
[facility]  Discharge Instructions: ?Thank you for choosing [facility] to provide your oncology and hematology care.  ?If you have a lab appointment with the Cancer Center, please come in thru the Main Entrance and check in at the main information desk. ? ?Wear comfortable clothing and clothing appropriate for easy access to any Portacath or PICC line.  ? ?We strive to give you quality time with your provider. You may need to reschedule your appointment if you arrive late (15 or more minutes).  Arriving late affects you and other patients whose appointments are after yours.  Also, if you miss three or more appointments without notifying the office, you may be dismissed from the clinic at the provider?s discretion.    ?  ?For prescription refill requests, have your pharmacy contact our office and allow 72 hours for refills to be completed.   ? ?Today you received the following [name], return as scheduled. ?  ?To help prevent nausea and vomiting after your treatment, we encourage you to take your nausea medication as directed. ? ?BELOW ARE SYMPTOMS THAT SHOULD BE REPORTED IMMEDIATELY: ?*FEVER GREATER THAN 100.4 F (38 ?C) OR HIGHER ?*CHILLS OR SWEATING ?*NAUSEA AND VOMITING THAT IS NOT CONTROLLED WITH YOUR NAUSEA MEDICATION ?*UNUSUAL SHORTNESS OF BREATH ?*UNUSUAL BRUISING OR BLEEDING ?*URINARY PROBLEMS (pain or burning when urinating, or frequent urination) ?*BOWEL PROBLEMS (unusual diarrhea, constipation, pain near the anus) ?TENDERNESS IN MOUTH AND THROAT WITH OR WITHOUT PRESENCE OF ULCERS (sore throat, sores in mouth, or a toothache) ?UNUSUAL RASH, SWELLING OR PAIN  ?UNUSUAL VAGINAL DISCHARGE OR ITCHING  ? ?Items with * indicate a potential emergency and should be followed up as soon as possible or go to the Emergency Department if any problems should occur. ? ?Please show the CHEMOTHERAPY ALERT CARD or IMMUNOTHERAPY ALERT CARD at check-in to the Emergency Department and triage  nurse. ? ?Should you have questions after your visit or need to cancel or reschedule your appointment, please contact [facility] [phone]  and follow the prompts.  Office hours are 8:00 a.m. to 4:30 p.m. Monday - Friday. Please note that voicemails left after 4:00 p.m. may not be returned until the following business day.  We are closed weekends and major holidays. You have access to a nurse at all times for urgent questions. Please call the main number to the clinic [phone] and follow the prompts. ? ?For any non-urgent questions, you may also contact your provider using MyChart. We now offer e-Visits for anyone 18 and older to request care online for non-urgent symptoms. For details visit [email or web address]. ?  ?Also download the MyChart app! Go to the app store, search "MyChart", open the app, select [place], and log in with your MyChart username and password. ? ?Due to Covid, a mask is required upon entering the hospital/clinic. If you do not have a mask, one will be given to you upon arrival. For doctor visits, patients may have 1 support person aged 18 or older with them. For treatment visits, patients cannot have anyone with them due to current Covid guidelines and our immunocompromised population.  ?

## 2022-01-06 NOTE — Progress Notes (Signed)
Patient presents today for [name] infusion per providers order.  Vital signs WNL  Patient has no new complaints at this time.  Peripheral IV started and blood return noted prior to infusion.

## 2022-01-13 ENCOUNTER — Other Ambulatory Visit: Payer: Self-pay

## 2022-01-13 ENCOUNTER — Inpatient Hospital Stay (HOSPITAL_COMMUNITY): Payer: Medicare Other | Attending: Hematology

## 2022-01-13 VITALS — BP 101/67 | HR 67 | Temp 98.4°F | Resp 18

## 2022-01-13 DIAGNOSIS — D509 Iron deficiency anemia, unspecified: Secondary | ICD-10-CM | POA: Insufficient documentation

## 2022-01-13 DIAGNOSIS — E559 Vitamin D deficiency, unspecified: Secondary | ICD-10-CM | POA: Diagnosis not present

## 2022-01-13 DIAGNOSIS — D500 Iron deficiency anemia secondary to blood loss (chronic): Secondary | ICD-10-CM

## 2022-01-13 MED ORDER — SODIUM CHLORIDE 0.9 % IV SOLN
510.0000 mg | Freq: Once | INTRAVENOUS | Status: AC
  Administered 2022-01-13: 510 mg via INTRAVENOUS
  Filled 2022-01-13: qty 510

## 2022-01-13 MED ORDER — ACETAMINOPHEN 325 MG PO TABS
650.0000 mg | ORAL_TABLET | Freq: Once | ORAL | Status: AC
  Administered 2022-01-13: 650 mg via ORAL
  Filled 2022-01-13: qty 2

## 2022-01-13 MED ORDER — SODIUM CHLORIDE 0.9 % IV SOLN: Freq: Once | INTRAVENOUS | Status: AC

## 2022-01-13 MED ORDER — LORATADINE 10 MG PO TABS
10.0000 mg | ORAL_TABLET | Freq: Once | ORAL | Status: AC
  Administered 2022-01-13: 10 mg via ORAL
  Filled 2022-01-13: qty 1

## 2022-01-13 NOTE — Progress Notes (Signed)
Pt presents today for [name] IV iron infusion per provider's order. Vital signs stable and pt voiced no new complaints at this time. ? ?Peripheral IV started with good blood return pre and post infusion. ? ?[name] given today per MD orders. Tolerated infusion without adverse affects. Vital signs stable. No complaints at this time. Discharged from clinic ambulatory with cane in stable condition. Alert and oriented x 3. F/U with [facility] as scheduled.   ?

## 2022-01-13 NOTE — Patient Instructions (Signed)
[facility]  Discharge Instructions: ?Thank you for choosing [facility] to provide your oncology and hematology care.  ?If you have a lab appointment with the Cancer Center, please come in thru the Main Entrance and check in at the main information desk. ? ?Wear comfortable clothing and clothing appropriate for easy access to any Portacath or PICC line.  ? ?We strive to give you quality time with your provider. You may need to reschedule your appointment if you arrive late (15 or more minutes).  Arriving late affects you and other patients whose appointments are after yours.  Also, if you miss three or more appointments without notifying the office, you may be dismissed from the clinic at the provider?s discretion.    ?  ?For prescription refill requests, have your pharmacy contact our office and allow 72 hours for refills to be completed.   ? ?Today you received [name] IV iron ?  ? ? ?BELOW ARE SYMPTOMS THAT SHOULD BE REPORTED IMMEDIATELY: ?*FEVER GREATER THAN 100.4 F (38 ?C) OR HIGHER ?*CHILLS OR SWEATING ?*NAUSEA AND VOMITING THAT IS NOT CONTROLLED WITH YOUR NAUSEA MEDICATION ?*UNUSUAL SHORTNESS OF BREATH ?*UNUSUAL BRUISING OR BLEEDING ?*URINARY PROBLEMS (pain or burning when urinating, or frequent urination) ?*BOWEL PROBLEMS (unusual diarrhea, constipation, pain near the anus) ?TENDERNESS IN MOUTH AND THROAT WITH OR WITHOUT PRESENCE OF ULCERS (sore throat, sores in mouth, or a toothache) ?UNUSUAL RASH, SWELLING OR PAIN  ?UNUSUAL VAGINAL DISCHARGE OR ITCHING  ? ?Items with * indicate a potential emergency and should be followed up as soon as possible or go to the Emergency Department if any problems should occur. ? ?Please show the CHEMOTHERAPY ALERT CARD or IMMUNOTHERAPY ALERT CARD at check-in to the Emergency Department and triage nurse. ? ?Should you have questions after your visit or need to cancel or reschedule your appointment, please contact  CANCER CENTER  [phone]  and follow the prompts.  Office hours are 8:00 a.m. to 4:30 p.m. Monday - Friday. Please note that voicemails left after 4:00 p.m. may not be returned until the following business day.  We are closed weekends and major holidays. You have access to a nurse at all times for urgent questions. Please call the main number to the clinic [phone] and follow the prompts. ? ?For any non-urgent questions, you may also contact your provider using MyChart. We now offer e-Visits for anyone 18 and older to request care online for non-urgent symptoms. For details visit [email or web address]. ?  ?Also download the MyChart app! Go to the app store, search "MyChart", open the app, select [place], and log in with your MyChart username and password. ? ?Due to Covid, a mask is required upon entering the hospital/clinic. If you do not have a mask, one will be given to you upon arrival. For doctor visits, patients may have 1 support person aged 18 or older with them. For treatment visits, patients cannot have anyone with them due to current Covid guidelines and our immunocompromised population.  ?

## 2022-02-23 IMAGING — DX DG CHEST 2V
2 series · 2 of 2 positions shown · non-contrast
Comparison: September 05, 2019

CLINICAL DATA: Epigastric region pain.  Hypertensive.

EXAM:
CHEST - 2 VIEW

[chest pa]
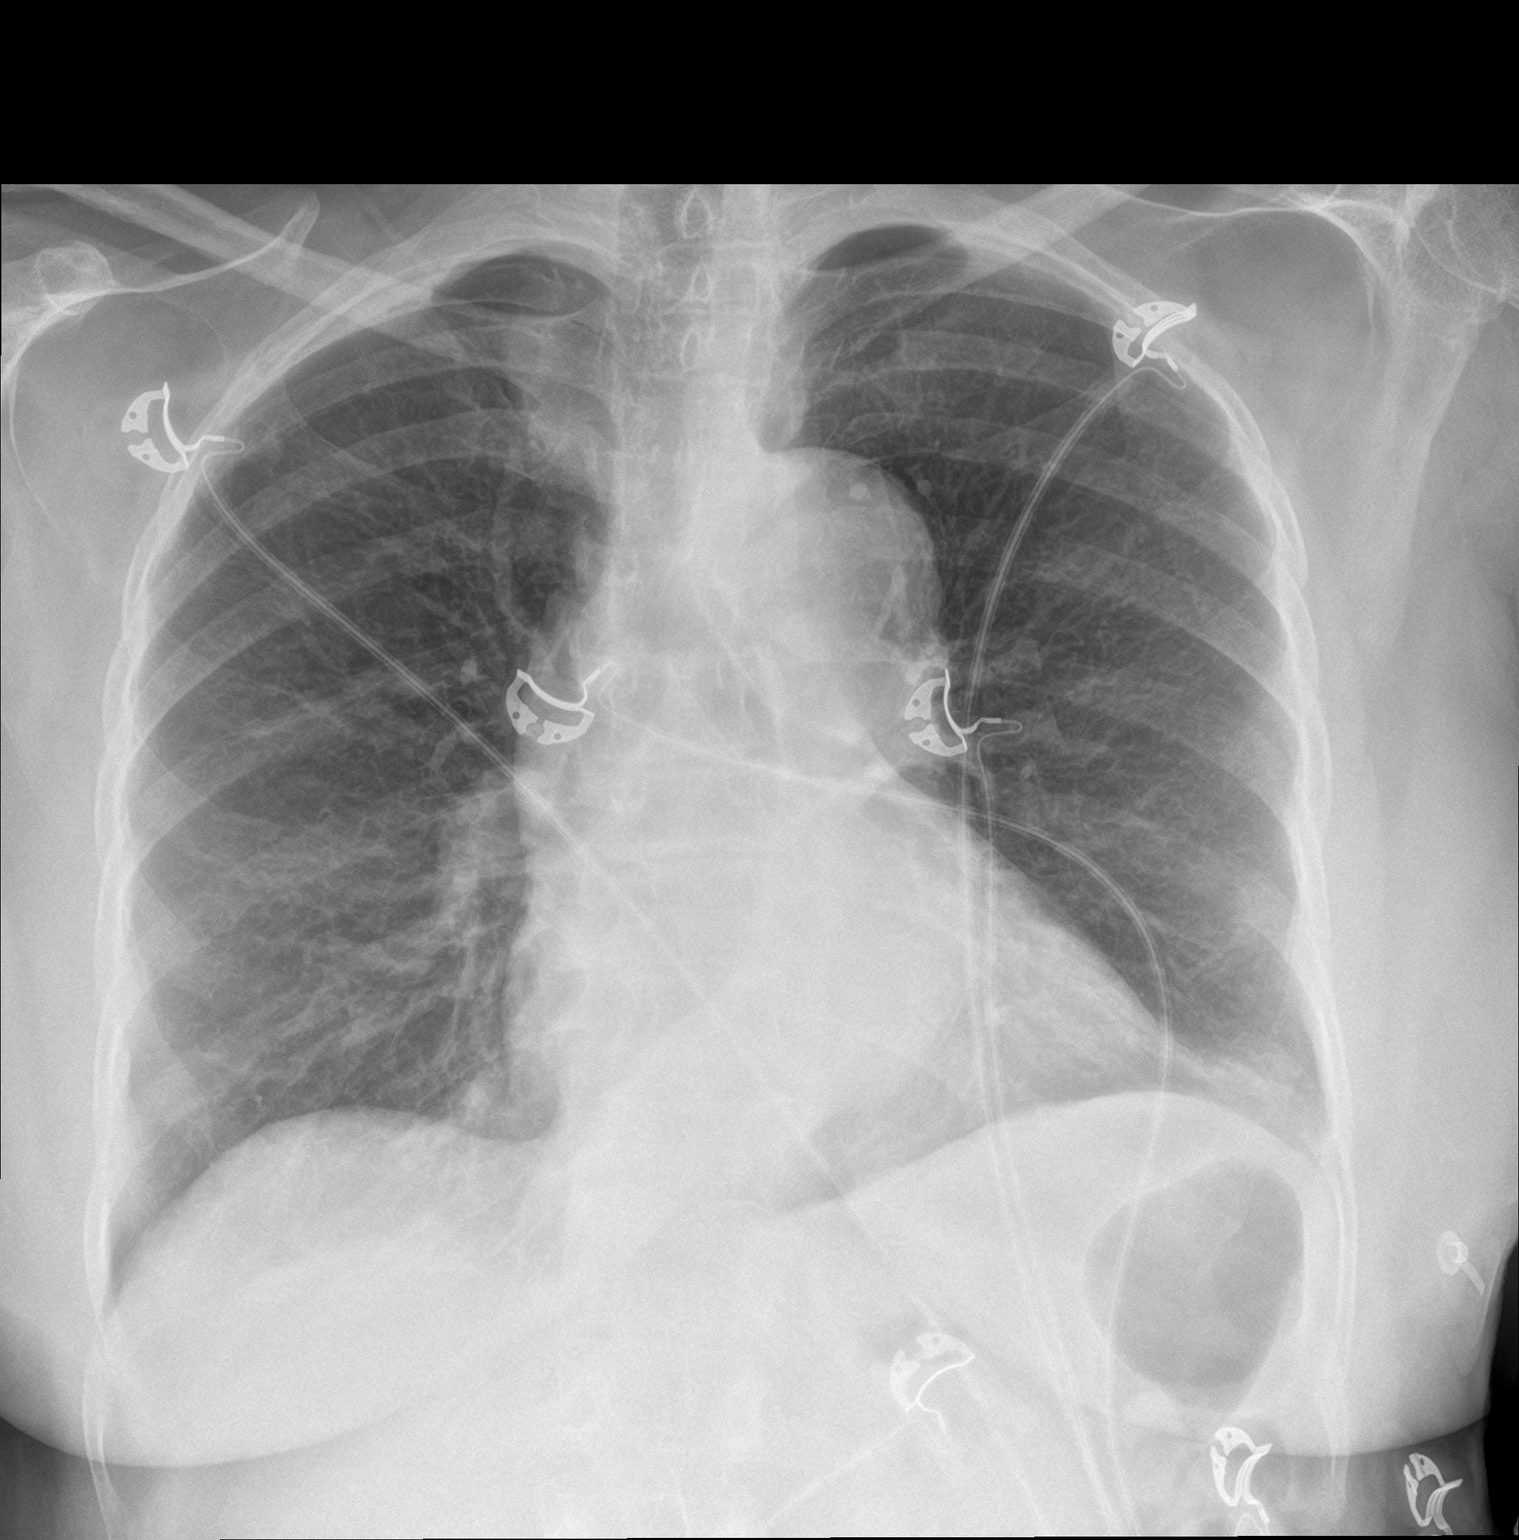

[chest lat]
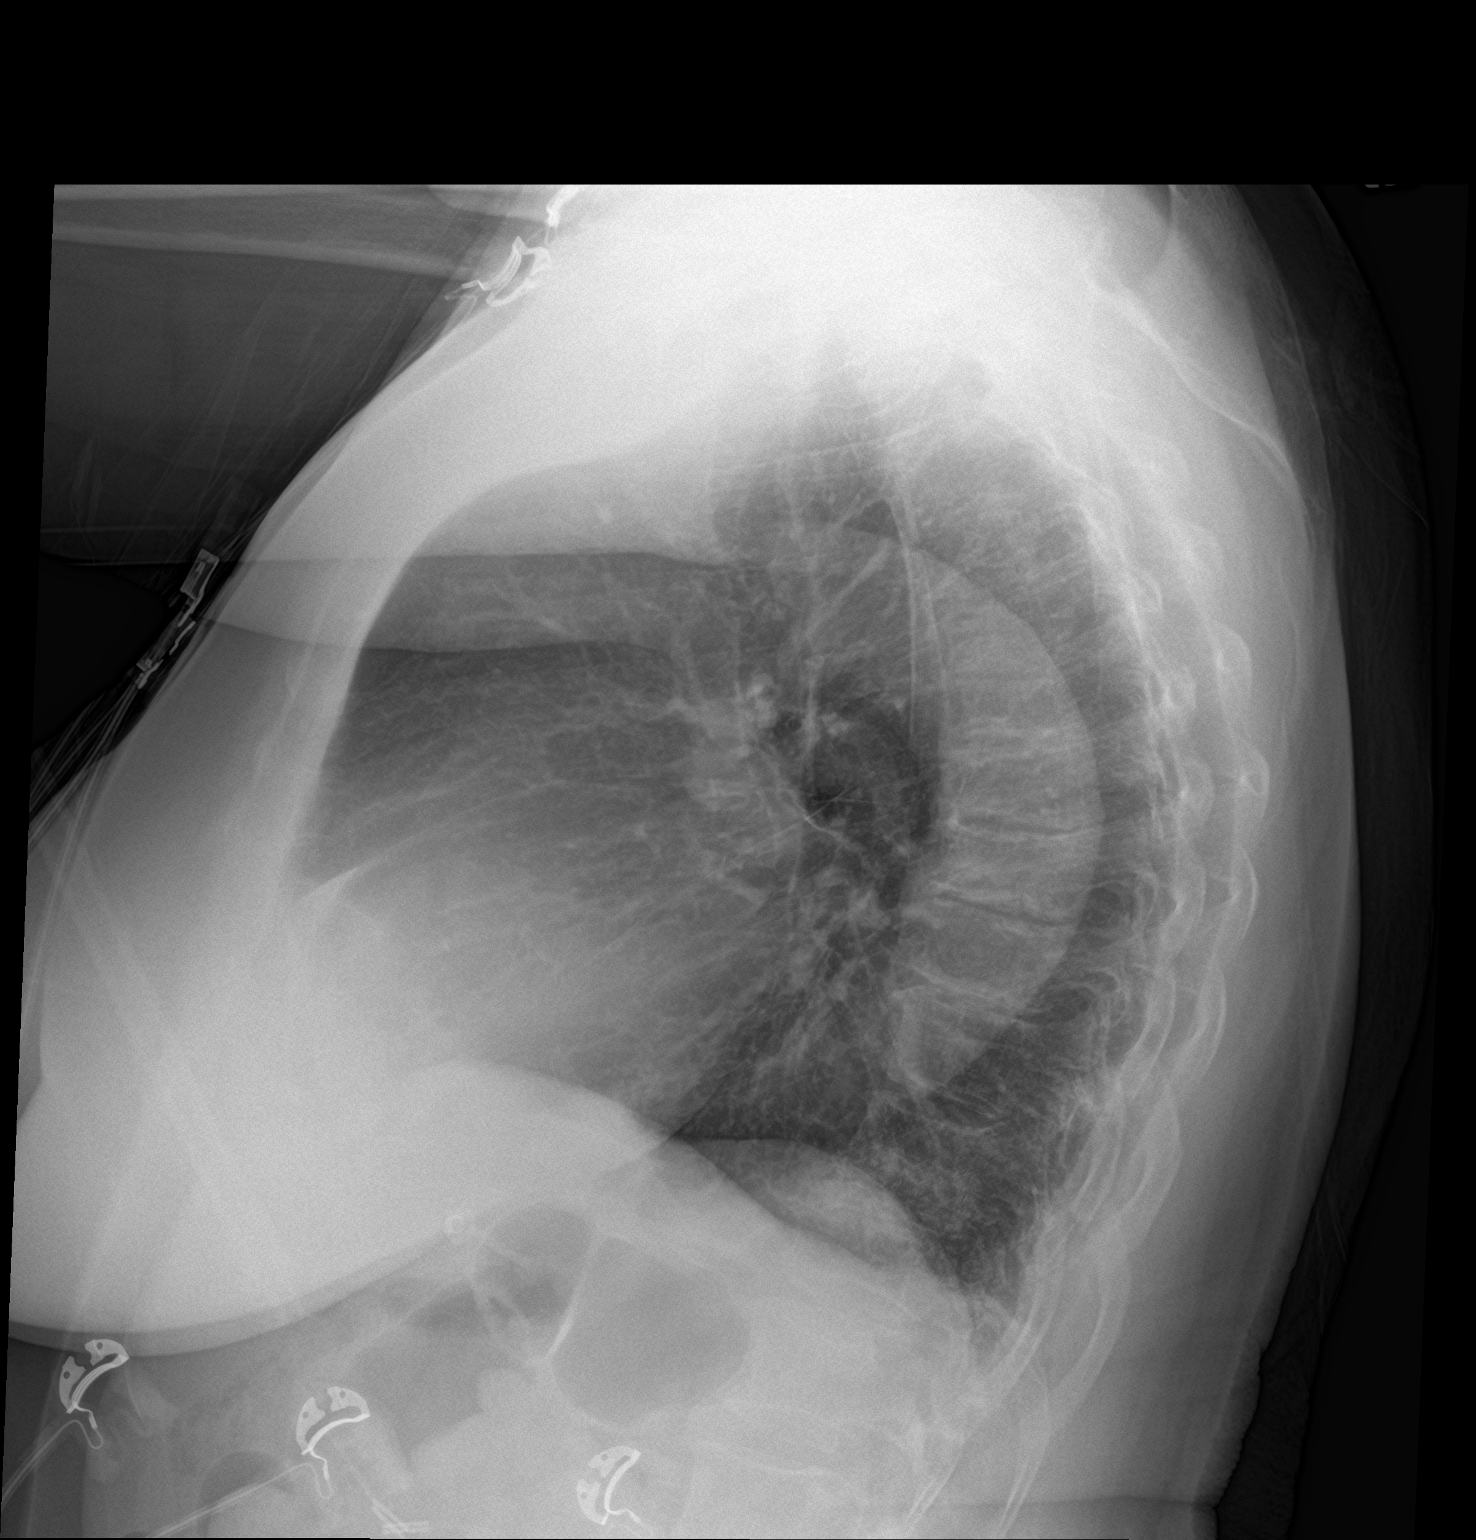

[2 of 2 positions shown; findings below may reference images not displayed]

FINDINGS: There is slight left base atelectasis. Lungs otherwise are clear.
Heart size and pulmonary vascularity are normal. Aorta is tortuous
with aortic atherosclerosis. No adenopathy. No pneumothorax. No bone
lesions.
IMPRESSION: Slight left base atelectasis. Lungs otherwise clear. Heart size
within normal limits. Aorta tortuous with aortic atherosclerosis.
Appearance of the aorta is likely secondary to chronic hypertensive
change. No adenopathy.

## 2022-03-01 ENCOUNTER — Encounter (HOSPITAL_COMMUNITY): Payer: Self-pay | Admitting: Hematology

## 2022-03-07 NOTE — Progress Notes (Signed)
? ?Melody Mitchell ?[address] ?[place], [place] [address] ? ? ?CLINIC:  ?Medical Oncology/Hematology ? ?PCP:  ?[name], MD ?[address] ?[place] [address] ?[phone] ? ? ?REASON FOR VISIT:  ?Follow-up for iron deficiency anemia ?  ?CURRENT THERAPY: Intermittent IV iron infusions (most recently 01/06/2022 & 01/13/2022) ? ?INTERVAL HISTORY:  ?[name] 81 y.o. female returns for routine follow-up of her iron deficiency anemia.  She was last seen by Tarri Abernethy PA-C on 01/03/2022. ? ?At today's visit, she reports feeling fair.  No recent hospitalizations, surgeries, or changes in baseline health status. ? ?She has a history of AVMs and chronic GI blood loss, and continues to report intermittent dark stools, although less frequently than before, now occurring once every 1-2 weeks. ?She denies any other bleeding such as epistaxis, hematemesis, hematuria, or hematochezia. ?She is symptomatic with significant fatigue, which was slightly improved after her most recent iron. ?She denies any pica, restless legs, headaches, chest pain, dyspnea on exertion, lightheadedness, or syncope.  She is not currently scheduled for any GI follow-up, but was previously seen by Neil Crouch, PA-C at [facility]. ?  ?She had previously lost about 15 to 20 pounds in the course of 6 months, but her weight has been stable since her last visit in February 2023.  She is drinking Ensure once daily.  She does still note some decreased appetite. ? ?She has 50% energy and 60% appetite. She endorses that she is maintaining a stable weight. ? ? ?REVIEW OF SYSTEMS:  ?Review of Systems  ?Constitutional:  Positive for appetite change and fatigue. Negative for chills, diaphoresis, fever and unexpected weight change.  ?HENT:   Negative for lump/mass and nosebleeds.   ?Eyes:  Negative for eye problems.  ?Respiratory:  Negative for cough, hemoptysis and shortness of breath.   ?Cardiovascular:  Negative  for chest pain, leg swelling and palpitations.  ?Gastrointestinal:  Negative for abdominal pain, blood in stool, constipation, diarrhea, nausea and vomiting.  ?Genitourinary:  Negative for hematuria.   ?Musculoskeletal:  Positive for arthralgias.  ?Skin: Negative.   ?Neurological:  Negative for dizziness, headaches and light-headedness.  ?Hematological:  Does not bruise/bleed easily.  ?Psychiatric/Behavioral:  Positive for depression. The patient is nervous/anxious.    ? ? ?PAST MEDICAL/SURGICAL HISTORY:  ?Past Medical History:  ?Diagnosis Date  ? Acid reflux   ? Arthritis   ? 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 ANTEGRADE BALLOON ENTEROSCOPY W/ APC IN 2011 [facility]  ? Bronchitis january 2014  ? Hiatal hernia   ? High cholesterol   ? History of contact dermatitis   ? Hx: recurrent pneumonia   ? Hypertension   ? IDA (iron deficiency anemia)   ? chronic iron infusions  ? Reflux   ? Vertigo   ? ?Past Surgical History:  ?Procedure Laterality Date  ? ABDOMINAL HYSTERECTOMY    ? APPENDECTOMY    ? BILATERAL BREAST BIOPSIES  BENIGN    ? CHOLECYSTECTOMY    ? COLONOSCOPY N/A 06/29/2015  ? SLF: The left colon is redundant 2. four colon polyps removed. No source  for change in bowel habits identified 3. Rectal bleeding due to large internal hemorrhoids   ? COLONOSCOPY W/ BIOPSIES  6 2010  ? Dr. Oneida Alar: Polypoid appearing lesion of the ascending colon, 3 mm sessile rectal polyp, small internal hemorrhoids. Pathology revealed polypoid mucosa, no adenomatous changes  ? Double-balloon enteroscopy, antegrade  April 2011  ? Dr. 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  ? ESOPHAGOGASTRODUODENOSCOPY  04/2009  ? Dr. Oneida Alar: Patent Schatzki ring dilated with advancing the scope, patchy erythema in the antrum, 2 small AVMs in the duodenal bulb, 2 additional AVMs noted in the second portion the duodenum, mild gastritis on pathology  ? ESOPHAGOGASTRODUODENOSCOPY N/A 06/29/2015  ?  SLF: 1. 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 small hiatal hernia 3. Mild non-erosive gastririts- NO obvious source for dyspepsia identified.   ? ESOPHAGOGASTRODUODENOSCOPY N/A 03/13/2020  ? Fields: Low-grade narrowing Schatzki ring, small hiatal hernia, multiple nonbleeding angiectasia's in the jejunum treated with APC.  Spot tattoo in jejunum at 90 cm from the incisors.  ? EYE SURGERY    ? ? ? ?SOCIAL HISTORY:  ?Social History  ? ?Socioeconomic History  ? Marital status: Divorced  ?  Spouse name: Not on file  ? Number of children: 1  ? Years of education: Not on file  ? Highest education level: Not on file  ?Occupational History  ? Not on file  ?Tobacco Use  ? Smoking status: Former  ?  Packs/day: 1.00  ?  Years: 25.00  ?  Pack years: 25.00  ?  Types: Cigarettes  ?  Quit date: 09/14/2007  ?  Years since quitting: 14.4  ? Smokeless tobacco: Never  ?Vaping Use  ? Vaping Use: Never used  ?Substance and Sexual Activity  ? Alcohol use: Not Currently  ?  Comment: occasional/rare Theadora Rama every now and then)  ? Drug use: No  ? Sexual activity: Yes  ?  Birth control/protection: Post-menopausal, Surgical  ?Other Topics Concern  ? Not on file  ?Social History Narrative  ? Not on file  ? ?Social Determinants of Health  ? ?Financial Resource Strain: Not on file  ?Food Insecurity: Not on file  ?Transportation Needs: Not on file  ?Physical Activity: Not on file  ?Stress: Not on file  ?Social Connections: Not on file  ?Intimate Partner Violence: Not on file  ? ? ?FAMILY HISTORY:  ?Family History  ?Problem Relation Age of Onset  ? Heart failure Mother   ? Diabetes Sister   ? Heart attack Father   ? Colon cancer Neg Hx   ? Gastric cancer Neg Hx   ? Esophageal cancer Neg Hx   ? ? ?CURRENT MEDICATIONS:  ?Outpatient Encounter Medications as of 03/08/2022  ?Medication Sig  ? acetaminophen (TYLENOL) 500 MG tablet Take 500 mg by mouth every 6 (six) hours as needed for mild pain or moderate pain. (Patient not taking: Reported on  01/13/2022)  ? amitriptyline (ELAVIL) 25 MG tablet Take 25 mg by mouth at bedtime as needed for sleep.  ? aspirin EC 325 MG tablet Take 325 mg by mouth daily.  ? atorvastatin (LIPITOR) 40 MG tablet Take 1 tablet (40 mg total) by mouth every evening.  ? escitalopram (LEXAPRO) 10 MG tablet Take 1 tablet by mouth daily.  ? esomeprazole (NEXIUM) 40 MG capsule Take 40 mg by mouth daily before breakfast.  ? ezetimibe (ZETIA) 10 MG tablet Take 10 mg by mouth in the morning.   ? fish oil-omega-3 fatty acids 1000 MG capsule Take 1 g by mouth in the morning.   ? meclizine (ANTIVERT) 25 MG tablet Take 25 mg by mouth daily as needed.  ? ondansetron (ZOFRAN-ODT) 4 MG disintegrating tablet '4mg'$  ODT q4 hours prn nausea/vomit  ? potassium chloride SA (KLOR-CON) 20 MEQ tablet Take 20 mEq by mouth 2 (two) times daily.  ? traMADol Veatrice Bourbon)  50 MG tablet Take 50 mg by mouth every 6 (six) hours as needed.  ? VITAMIN B12 TR 1000 MCG TBCR Take 1 tablet by mouth in the morning.   ? ?No facility-administered encounter medications on file as of 03/08/2022.  ? ? ?ALLERGIES:  ?Allergies  ?Allergen Reactions  ? Latex Hives, Itching and Rash  ? Sulfa Antibiotics Hives and Itching  ?  Other reaction(s): hives  ? Other Hives  ? Pravastatin Sodium Other (See Comments)  ? ? ? ?PHYSICAL EXAM:  ?ECOG PERFORMANCE STATUS: 1 - Symptomatic but completely ambulatory ? ?There were no vitals filed for this visit. ?There were no vitals filed for this visit. ?Physical Exam ?Constitutional:   ?   Appearance: Normal appearance. She is obese.  ?HENT:  ?   Head: Normocephalic and atraumatic.  ?   Mouth/Throat:  ?   Mouth: Mucous membranes are moist.  ?Eyes:  ?   Extraocular Movements: Extraocular movements intact.  ?   Pupils: Pupils are equal, round, and reactive to light.  ?Cardiovascular:  ?   Rate and Rhythm: Regular rhythm. Tachycardia present.  ?   Pulses: Normal pulses.  ?   Heart sounds: Normal heart sounds.  ?Pulmonary:  ?   Effort: Pulmonary effort is normal.   ?   Breath sounds: Normal breath sounds.  ?Abdominal:  ?   General: Bowel sounds are normal.  ?   Palpations: Abdomen is soft.  ?   Tenderness: There is no abdominal tenderness.  ?Musculoskeletal:     ?

## 2022-03-08 ENCOUNTER — Encounter: Payer: Self-pay | Admitting: Internal Medicine

## 2022-03-08 ENCOUNTER — Inpatient Hospital Stay (HOSPITAL_COMMUNITY): Payer: Medicare (Managed Care) | Attending: Hematology

## 2022-03-08 ENCOUNTER — Telehealth: Payer: Self-pay | Admitting: Gastroenterology

## 2022-03-08 ENCOUNTER — Inpatient Hospital Stay (HOSPITAL_BASED_OUTPATIENT_CLINIC_OR_DEPARTMENT_OTHER): Payer: Medicare (Managed Care) | Admitting: Physician Assistant

## 2022-03-08 VITALS — BP 114/89 | HR 98 | Temp 98.8°F | Resp 18 | Ht 62.0 in | Wt 139.2 lb

## 2022-03-08 DIAGNOSIS — D500 Iron deficiency anemia secondary to blood loss (chronic): Secondary | ICD-10-CM

## 2022-03-08 DIAGNOSIS — E559 Vitamin D deficiency, unspecified: Secondary | ICD-10-CM

## 2022-03-08 DIAGNOSIS — D509 Iron deficiency anemia, unspecified: Secondary | ICD-10-CM | POA: Diagnosis present

## 2022-03-08 DIAGNOSIS — E538 Deficiency of other specified B group vitamins: Secondary | ICD-10-CM | POA: Diagnosis not present

## 2022-03-08 DIAGNOSIS — I10 Essential (primary) hypertension: Secondary | ICD-10-CM | POA: Diagnosis not present

## 2022-03-08 LAB — CBC WITH DIFFERENTIAL/PLATELET
Abs Immature Granulocytes: 0.03 10*3/uL (ref 0.00–0.07)
Basophils Absolute: 0 10*3/uL (ref 0.0–0.1)
Basophils Relative: 0 %
Eosinophils Absolute: 0 10*3/uL (ref 0.0–0.5)
Eosinophils Relative: 0 %
HCT: 31.9 % — ABNORMAL LOW (ref 36.0–46.0)
Hemoglobin: 10 g/dL — ABNORMAL LOW (ref 12.0–15.0)
Immature Granulocytes: 0 %
Lymphocytes Relative: 7 %
Lymphs Abs: 0.5 10*3/uL — ABNORMAL LOW (ref 0.7–4.0)
MCH: 29.8 pg (ref 26.0–34.0)
MCHC: 31.3 g/dL (ref 30.0–36.0)
MCV: 94.9 fL (ref 80.0–100.0)
Monocytes Absolute: 0.1 10*3/uL (ref 0.1–1.0)
Monocytes Relative: 1 %
Neutro Abs: 6.4 10*3/uL (ref 1.7–7.7)
Neutrophils Relative %: 92 %
Platelets: 351 10*3/uL (ref 150–400)
RBC: 3.36 MIL/uL — ABNORMAL LOW (ref 3.87–5.11)
RDW: 17 % — ABNORMAL HIGH (ref 11.5–15.5)
WBC: 7 10*3/uL (ref 4.0–10.5)
nRBC: 0 % (ref 0.0–0.2)

## 2022-03-08 LAB — FERRITIN: Ferritin: 20 ng/mL (ref 11–307)

## 2022-03-08 LAB — IRON AND TIBC
Iron: 33 ug/dL (ref 28–170)
Saturation Ratios: 10 % — ABNORMAL LOW (ref 10.4–31.8)
TIBC: 333 ug/dL (ref 250–450)
UIBC: 300 ug/dL

## 2022-03-08 NOTE — Patient Instructions (Addendum)
[place] at [facility] ?Discharge Instructions ? ?You were seen today by Tarri Abernethy PA-C for your iron deficiency anemia. ? ?Your blood and iron levels are low, so we will schedule you for IV IRON x2 doses.  You should also continue to follow-up with your gastroenterologist [facility] doctor) for ongoing monitoring and management of your intestinal blood loss.   ** Call your GI office [facility] at [phone] to schedule a follow up appointment with Neil Crouch, PA-C. ? ?You should START taking over-the-counter vitamin B12 supplement 1000 mcg daily ? ?Your vitamin D levels are too high.  Just as a reminder, you should NOT be taking any vitamin D supplement or multivitamin at this time. ? ?You should CONTINUE drinking Ensure (or Boost) 1-2 drinks daily due to your decreased appetite and weight loss.   ? ?LABS: Return in 2 months for repeat labs ? ?FOLLOW-UP APPOINTMENT: Office visit in 2 months, same day as labs ? ? ?Thank you for choosing [place] at [facility] to provide your oncology and hematology care.  To afford each patient quality time with our provider, please arrive at least 15 minutes before your scheduled appointment time.  ? ?If you have a lab appointment with the [facility] please come in thru the Main Entrance and check in at the main information desk. ? ?You need to re-schedule your appointment should you arrive 10 or more minutes late.  We strive to give you quality time with our providers, and arriving late affects you and other patients whose appointments are after yours.  Also, if you no show three or more times for appointments you may be dismissed from the clinic at the providers discretion.     ?Again, thank you for choosing [facility].  Our hope is that these requests will decrease the amount of time that you wait before being seen by our physicians.        ?_____________________________________________________________ ? ?Should you have questions after your visit to [facility], please contact our office at [phone] and follow the prompts.  Our office hours are 8:00 a.m. and 4:30 p.m. Monday - Friday.  Please note that voicemails left after 4:00 p.m. may not be returned until the following business day.  We are closed weekends and major holidays.  You do have access to a nurse 24-7, just call the main number to the clinic [phone] and do not press any options, hold on the line and a nurse will answer the phone.   ? ?For prescription refill requests, have your pharmacy contact our office and allow 72 hours.   ? ?Due to Covid, you will need to wear a mask upon entering the hospital. If you do not have a mask, a mask will be given to you at the Main Entrance upon arrival. For doctor visits, patients may have 1 support person age 5 or older with them. For treatment visits, patients can not have anyone with them due to social distancing guidelines and our immunocompromised population.  ? ? ? ?

## 2022-03-08 NOTE — Telephone Encounter (Signed)
Hematology reached out today requesting follow up with [place]. I saw her in 07/2021 and requested 6 month follow up. It appears that she has no ov. ? ?Please reach out and schedule follow up ov for IDA. ?

## 2022-03-14 ENCOUNTER — Inpatient Hospital Stay (HOSPITAL_COMMUNITY): Payer: Medicare Other | Attending: Hematology

## 2022-03-14 ENCOUNTER — Encounter (HOSPITAL_COMMUNITY): Payer: Self-pay | Admitting: Hematology

## 2022-03-14 ENCOUNTER — Encounter (HOSPITAL_COMMUNITY): Payer: Self-pay

## 2022-03-14 VITALS — BP 158/64 | HR 66 | Temp 98.6°F | Resp 16

## 2022-03-14 DIAGNOSIS — E559 Vitamin D deficiency, unspecified: Secondary | ICD-10-CM | POA: Diagnosis not present

## 2022-03-14 DIAGNOSIS — D509 Iron deficiency anemia, unspecified: Secondary | ICD-10-CM | POA: Insufficient documentation

## 2022-03-14 DIAGNOSIS — D500 Iron deficiency anemia secondary to blood loss (chronic): Secondary | ICD-10-CM

## 2022-03-14 MED ORDER — ACETAMINOPHEN 325 MG PO TABS
650.0000 mg | ORAL_TABLET | Freq: Once | ORAL | Status: AC
  Administered 2022-03-14: 650 mg via ORAL
  Filled 2022-03-14: qty 2

## 2022-03-14 MED ORDER — SODIUM CHLORIDE 0.9 % IV SOLN: Freq: Once | INTRAVENOUS | Status: AC

## 2022-03-14 MED ORDER — LORATADINE 10 MG PO TABS
10.0000 mg | ORAL_TABLET | Freq: Once | ORAL | Status: AC
  Administered 2022-03-14: 10 mg via ORAL
  Filled 2022-03-14: qty 1

## 2022-03-14 MED ORDER — SODIUM CHLORIDE 0.9 % IV SOLN
510.0000 mg | Freq: Once | INTRAVENOUS | Status: AC
  Administered 2022-03-14: 510 mg via INTRAVENOUS
  Filled 2022-03-14: qty 17

## 2022-03-14 NOTE — Progress Notes (Signed)
Venofer given today per MD orders. Tolerated infusion without adverse affects. Vital signs stable. No complaints at this time. Discharged from clinic ambulatory in stable condition. Alert and oriented x 3. F/U with [facility] as scheduled.  

## 2022-03-14 NOTE — Patient Instructions (Signed)
[facility]  Discharge Instructions: ?Thank you for choosing [facility] to provide your oncology and hematology care.  ?If you have a lab appointment with the [facility], please come in thru the Main Entrance and check in at the main information desk. ? ?Wear comfortable clothing and clothing appropriate for easy access to any Portacath or PICC line.  ? ?We strive to give you quality time with your provider. You may need to reschedule your appointment if you arrive late (15 or more minutes).  Arriving late affects you and other patients whose appointments are after yours.  Also, if you miss three or more appointments without notifying the office, you may be dismissed from the clinic at the provider?s discretion.    ?  ?For prescription refill requests, have your pharmacy contact our office and allow 72 hours for refills to be completed.   ? ?Today you received the following chemotherapy and/or immunotherapy agents Venofer 300 mg .     ?  ?To help prevent nausea and vomiting after your treatment, we encourage you to take your nausea medication as directed. ? ?BELOW ARE SYMPTOMS THAT SHOULD BE REPORTED IMMEDIATELY: ?*FEVER GREATER THAN 100.4 F (38 ?C) OR HIGHER ?*CHILLS OR SWEATING ?*NAUSEA AND VOMITING THAT IS NOT CONTROLLED WITH YOUR NAUSEA MEDICATION ?*UNUSUAL SHORTNESS OF BREATH ?*UNUSUAL BRUISING OR BLEEDING ?*URINARY PROBLEMS (pain or burning when urinating, or frequent urination) ?*BOWEL PROBLEMS (unusual diarrhea, constipation, pain near the anus) ?TENDERNESS IN MOUTH AND THROAT WITH OR WITHOUT PRESENCE OF ULCERS (sore throat, sores in mouth, or a toothache) ?UNUSUAL RASH, SWELLING OR PAIN  ?UNUSUAL VAGINAL DISCHARGE OR ITCHING  ? ?Items with * indicate a potential emergency and should be followed up as soon as possible or go to the Emergency Department if any problems should occur. ? ?Please show the CHEMOTHERAPY ALERT CARD or IMMUNOTHERAPY ALERT CARD at check-in to the  Emergency Department and triage nurse. ? ?Should you have questions after your visit or need to cancel or reschedule your appointment, please contact [facility] [phone]  and follow the prompts.  Office hours are 8:00 a.m. to 4:30 p.m. Monday - Friday. Please note that voicemails left after 4:00 p.m. may not be returned until the following business day.  We are closed weekends and major holidays. You have access to a nurse at all times for urgent questions. Please call the main number to the clinic [phone] and follow the prompts. ? ?For any non-urgent questions, you may also contact your provider using MyChart. We now offer e-Visits for anyone 31 and older to request care online for non-urgent symptoms. For details visit [email or web address]. ?  ?Also download the MyChart app! Go to the app store, search "MyChart", open the app, select [place], and log in with your MyChart username and password. ? ?Due to Covid, a mask is required upon entering the hospital/clinic. If you do not have a mask, one will be given to you upon arrival. For doctor visits, patients may have 1 support person aged 62 or older with them. For treatment visits, patients cannot have anyone with them due to current Covid guidelines and our immunocompromised population.  ?

## 2022-03-21 ENCOUNTER — Inpatient Hospital Stay (HOSPITAL_COMMUNITY): Payer: Medicare Other

## 2022-03-21 VITALS — BP 106/77 | HR 77 | Temp 97.2°F | Resp 16

## 2022-03-21 DIAGNOSIS — D500 Iron deficiency anemia secondary to blood loss (chronic): Secondary | ICD-10-CM

## 2022-03-21 DIAGNOSIS — D509 Iron deficiency anemia, unspecified: Secondary | ICD-10-CM | POA: Diagnosis not present

## 2022-03-21 MED ORDER — SODIUM CHLORIDE 0.9 % IV SOLN
510.0000 mg | Freq: Once | INTRAVENOUS | Status: AC
  Administered 2022-03-21: 510 mg via INTRAVENOUS
  Filled 2022-03-21: qty 17

## 2022-03-21 MED ORDER — LORATADINE 10 MG PO TABS
10.0000 mg | ORAL_TABLET | Freq: Once | ORAL | Status: AC
  Administered 2022-03-21: 10 mg via ORAL
  Filled 2022-03-21: qty 1

## 2022-03-21 MED ORDER — SODIUM CHLORIDE 0.9 % IV SOLN: Freq: Once | INTRAVENOUS | Status: AC

## 2022-03-21 MED ORDER — ACETAMINOPHEN 325 MG PO TABS
650.0000 mg | ORAL_TABLET | Freq: Once | ORAL | Status: AC
  Administered 2022-03-21: 650 mg via ORAL
  Filled 2022-03-21: qty 2

## 2022-03-21 NOTE — Progress Notes (Signed)
[name] infusion given per orders. Patient tolerated it well without problems. Vitals stable and discharged home from clinic ambulatory. Follow up as scheduled.  

## 2022-03-21 NOTE — Patient Instructions (Signed)
[facility]  Discharge Instructions: °Thank you for choosing [facility] to provide your oncology and hematology care.  °If you have a lab appointment with the Cancer Center, please come in thru the Main Entrance and check in at the main information desk. ° °Wear comfortable clothing and clothing appropriate for easy access to any Portacath or PICC line.  ° °We strive to give you quality time with your provider. You may need to reschedule your appointment if you arrive late (15 or more minutes).  Arriving late affects you and other patients whose appointments are after yours.  Also, if you miss three or more appointments without notifying the office, you may be dismissed from the clinic at the provider’s discretion.    °  °For prescription refill requests, have your pharmacy contact our office and allow 72 hours for refills to be completed.   ° °Today you received the following chemotherapy and/or immunotherapy agents [name]    °  °To help prevent nausea and vomiting after your treatment, we encourage you to take your nausea medication as directed. ° °BELOW ARE SYMPTOMS THAT SHOULD BE REPORTED IMMEDIATELY: °*FEVER GREATER THAN 100.4 F (38 °C) OR HIGHER °*CHILLS OR SWEATING °*NAUSEA AND VOMITING THAT IS NOT CONTROLLED WITH YOUR NAUSEA MEDICATION °*UNUSUAL SHORTNESS OF BREATH °*UNUSUAL BRUISING OR BLEEDING °*URINARY PROBLEMS (pain or burning when urinating, or frequent urination) °*BOWEL PROBLEMS (unusual diarrhea, constipation, pain near the anus) °TENDERNESS IN MOUTH AND THROAT WITH OR WITHOUT PRESENCE OF ULCERS (sore throat, sores in mouth, or a toothache) °UNUSUAL RASH, SWELLING OR PAIN  °UNUSUAL VAGINAL DISCHARGE OR ITCHING  ° °Items with * indicate a potential emergency and should be followed up as soon as possible or go to the Emergency Department if any problems should occur. ° °Please show the CHEMOTHERAPY ALERT CARD or IMMUNOTHERAPY ALERT CARD at check-in to the Emergency  Department and triage nurse. ° °Should you have questions after your visit or need to cancel or reschedule your appointment, please contact [facility] [phone]  and follow the prompts.  Office hours are 8:00 a.m. to 4:30 p.m. Monday - Friday. Please note that voicemails left after 4:00 p.m. may not be returned until the following business day.  We are closed weekends and major holidays. You have access to a nurse at all times for urgent questions. Please call the main number to the clinic [phone] and follow the prompts. ° °For any non-urgent questions, you may also contact your provider using MyChart. We now offer e-Visits for anyone 18 and older to request care online for non-urgent symptoms. For details visit [email or web address]. °  °Also download the MyChart app! Go to the app store, search "MyChart", open the app, select [place], and log in with your MyChart username and password. ° °Due to Covid, a mask is required upon entering the hospital/clinic. If you do not have a mask, one will be given to you upon arrival. For doctor visits, patients may have 1 support person aged 18 or older with them. For treatment visits, patients cannot have anyone with them due to current Covid guidelines and our immunocompromised population.  °

## 2022-04-09 ENCOUNTER — Emergency Department (HOSPITAL_COMMUNITY)
Admission: EM | Admit: 2022-04-09 | Discharge: 2022-04-10 | Disposition: A | Discharge status: Home / Self Care | Payer: Medicare Other | Attending: Emergency Medicine | Admitting: Emergency Medicine

## 2022-04-09 ENCOUNTER — Other Ambulatory Visit: Payer: Self-pay

## 2022-04-09 ENCOUNTER — Encounter (HOSPITAL_COMMUNITY): Payer: Self-pay | Admitting: Hematology

## 2022-04-09 ENCOUNTER — Emergency Department (HOSPITAL_COMMUNITY): Payer: Medicare Other

## 2022-04-09 ENCOUNTER — Encounter (HOSPITAL_COMMUNITY): Payer: Self-pay

## 2022-04-09 DIAGNOSIS — R519 Headache, unspecified: Secondary | ICD-10-CM | POA: Insufficient documentation

## 2022-04-09 DIAGNOSIS — Z7982 Long term (current) use of aspirin: Secondary | ICD-10-CM | POA: Insufficient documentation

## 2022-04-09 DIAGNOSIS — R413 Other amnesia: Secondary | ICD-10-CM | POA: Insufficient documentation

## 2022-04-09 DIAGNOSIS — Z91040 Latex allergy status: Secondary | ICD-10-CM | POA: Diagnosis not present

## 2022-04-09 DIAGNOSIS — D649 Anemia, unspecified: Secondary | ICD-10-CM | POA: Diagnosis not present

## 2022-04-09 DIAGNOSIS — E876 Hypokalemia: Secondary | ICD-10-CM | POA: Diagnosis not present

## 2022-04-09 DIAGNOSIS — R42 Dizziness and giddiness: Secondary | ICD-10-CM | POA: Diagnosis not present

## 2022-04-09 LAB — COMPREHENSIVE METABOLIC PANEL
ALT: 13 U/L (ref 0–44)
AST: 20 U/L (ref 15–41)
Albumin: 3.5 g/dL (ref 3.5–5.0)
Alkaline Phosphatase: 52 U/L (ref 38–126)
Anion gap: 3 — ABNORMAL LOW (ref 5–15)
BUN: 12 mg/dL (ref 8–23)
CO2: 24 mmol/L (ref 22–32)
Calcium: 8.4 mg/dL — ABNORMAL LOW (ref 8.9–10.3)
Chloride: 113 mmol/L — ABNORMAL HIGH (ref 98–111)
Creatinine, Ser: 0.91 mg/dL (ref 0.44–1.00)
GFR, Estimated: >60 mL/min (ref >60)
Glucose, Bld: 115 mg/dL — ABNORMAL HIGH (ref 70–99)
Potassium: 3.4 mmol/L — ABNORMAL LOW (ref 3.5–5.1)
Sodium: 140 mmol/L (ref 135–145)
Total Bilirubin: <0.1 mg/dL — ABNORMAL LOW (ref 0.3–1.2)
Total Protein: 6.3 g/dL — ABNORMAL LOW (ref 6.5–8.1)

## 2022-04-09 LAB — CBC WITH DIFFERENTIAL/PLATELET
Abs Immature Granulocytes: 0.01 10*3/uL (ref 0.00–0.07)
Basophils Absolute: 0 10*3/uL (ref 0.0–0.1)
Basophils Relative: 0 %
Eosinophils Absolute: 0.1 10*3/uL (ref 0.0–0.5)
Eosinophils Relative: 2 %
HCT: 28.7 % — ABNORMAL LOW (ref 36.0–46.0)
Hemoglobin: 9 g/dL — ABNORMAL LOW (ref 12.0–15.0)
Immature Granulocytes: 0 %
Lymphocytes Relative: 18 %
Lymphs Abs: 0.8 10*3/uL (ref 0.7–4.0)
MCH: 31.8 pg (ref 26.0–34.0)
MCHC: 31.4 g/dL (ref 30.0–36.0)
MCV: 101.4 fL — ABNORMAL HIGH (ref 80.0–100.0)
Monocytes Absolute: 0.5 10*3/uL (ref 0.1–1.0)
Monocytes Relative: 11 %
Neutro Abs: 3 10*3/uL (ref 1.7–7.7)
Neutrophils Relative %: 69 %
Platelets: 298 10*3/uL (ref 150–400)
RBC: 2.83 MIL/uL — ABNORMAL LOW (ref 3.87–5.11)
RDW: 17.4 % — ABNORMAL HIGH (ref 11.5–15.5)
WBC: 4.3 10*3/uL (ref 4.0–10.5)
nRBC: 0 % (ref 0.0–0.2)

## 2022-04-09 MED ORDER — IOHEXOL 350 MG/ML SOLN
100.0000 mL | Freq: Once | INTRAVENOUS | Status: AC | PRN
  Administered 2022-04-09: 100 mL via INTRAVENOUS

## 2022-04-09 MED ORDER — LORAZEPAM 2 MG/ML IJ SOLN
0.5000 mg | Freq: Once | INTRAMUSCULAR | Status: AC
  Administered 2022-04-09: 0.5 mg via INTRAVENOUS
  Filled 2022-04-09: qty 1

## 2022-04-09 MED ORDER — SODIUM CHLORIDE 0.9 % IV BOLUS
500.0000 mL | Freq: Once | INTRAVENOUS | Status: AC
  Administered 2022-04-09: 500 mL via INTRAVENOUS

## 2022-04-09 MED ORDER — HYDROCODONE-ACETAMINOPHEN 5-325 MG PO TABS: 1.0000 | ORAL_TABLET | Freq: Once | ORAL | Status: DC

## 2022-04-09 NOTE — ED Provider Notes (Signed)
[facility] EMERGENCY DEPARTMENT Provider Note   CSN: [id number] Arrival date & time: 04/09/22  2031     History  Chief Complaint  Patient presents with   Headache    CHRISTINA GINTZ is a 81 y.o. female.  HPI  Patient is an 81 year old female with a history of GERD, CVA, who presents to the emergency department due to intermittent headaches for the past 2 days.  Patient presented today via EMS.  Patient states that she has been having brief sharp episodes of pain along her forehead for the past 2 days.  States that her symptoms come and go frequently.  States her pain starts in her forehead and radiates down the right side of the face.  Denies any visual changes, neck pain, numbness, weakness, chest pain, shortness of breath.  She has been taking Tylenol with little relief.  She also notes some mild lightheadedness that occurs when she moves from a sitting to a standing position quickly.  She states that she has been eating and drinking but has been eating less than normal today.     Home Medications Prior to Admission medications   Medication Sig Start Date End Date Taking? Authorizing Provider  acetaminophen (TYLENOL) 500 MG tablet Take 500 mg by mouth every 6 (six) hours as needed for mild pain or moderate pain.    [provider]  amitriptyline (ELAVIL) 25 MG tablet Take 25 mg by mouth at bedtime as needed for sleep.    [provider]  aspirin EC 325 MG tablet Take 325 mg by mouth daily. 03/20/20   [provider]  atorvastatin (LIPITOR) 40 MG tablet Take 1 tablet (40 mg total) by mouth every evening. 09/06/19 11/02/21  Roxan Hockey, MD  cetirizine (ZYRTEC) 10 MG tablet Take 1 tablet by mouth daily.    [provider]  Docusate Sodium (DSS) 100 MG CAPS Take by mouth.    [provider]  escitalopram (LEXAPRO) 10 MG tablet Take 1 tablet by mouth daily. 09/04/20   [provider]  esomeprazole (NEXIUM) 40 MG capsule Take 40 mg  by mouth daily before breakfast.    [provider]  ezetimibe (ZETIA) 10 MG tablet Take 10 mg by mouth in the morning.  11/11/19   [provider]  fish oil-omega-3 fatty acids 1000 MG capsule Take 1 g by mouth in the morning.     [provider]  fluticasone (FLONASE) 50 MCG/ACT nasal spray 1 spray into each nostril daily. 09/24/13   [provider]  meclizine (ANTIVERT) 25 MG tablet Take 25 mg by mouth daily as needed. 08/07/20   [provider]  ondansetron (ZOFRAN-ODT) 4 MG disintegrating tablet '4mg'$  ODT q4 hours prn nausea/vomit 11/06/21   Milton Ferguson, MD  potassium chloride SA (KLOR-CON) 20 MEQ tablet Take 20 mEq by mouth 2 (two) times daily. 03/20/20   [provider]  traMADol (ULTRAM) 50 MG tablet Take 50 mg by mouth every 6 (six) hours as needed. 11/22/21   [provider]  VITAMIN B12 TR 1000 MCG TBCR Take 1 tablet by mouth in the morning.  11/11/19   [provider]  Vitamin D, Ergocalciferol, (DRISDOL) 1.25 MG (50000 UNIT) CAPS capsule Take by mouth. 02/21/22   [provider]      Allergies    Latex, Sulfa antibiotics, Other, and Pravastatin sodium    Review of Systems   Review of Systems  All other systems reviewed and are negative. Ten systems reviewed  and are negative for acute change, except as noted in the HPI.   Physical Exam Updated Vital Signs BP 111/72   Pulse 67   Temp 98.5 F (36.9 C) (Oral)   SpO2 99%  Physical Exam Vitals and nursing note reviewed.  Constitutional:      General: She is not in acute distress.    Appearance: Normal appearance. She is well-developed. She is not ill-appearing, toxic-appearing or diaphoretic.  HENT:     Head: Normocephalic and atraumatic.     Right Ear: External ear normal.     Left Ear: External ear normal.     Nose: Nose normal.     Mouth/Throat:     Mouth: Mucous membranes are moist.     Pharynx: Oropharynx is clear. No oropharyngeal exudate or  posterior oropharyngeal erythema.  Eyes:     General: No scleral icterus.    Extraocular Movements: Extraocular movements intact.     Right eye: Normal extraocular motion and no nystagmus.     Left eye: Normal extraocular motion and no nystagmus.     Pupils: Pupils are equal, round, and reactive to light. Pupils are equal.     Right eye: Pupil is round and reactive.     Left eye: Pupil is round and reactive.     Comments: PERRL.  EOMI.  Neck:     Comments: No nuchal rigidity.  No carotid bruits. Cardiovascular:     Rate and Rhythm: Normal rate and regular rhythm.     Pulses: Normal pulses.     Heart sounds: Normal heart sounds. No murmur heard.   No friction rub. No gallop.  Pulmonary:     Effort: Pulmonary effort is normal. No respiratory distress.     Breath sounds: Normal breath sounds. No stridor. No wheezing, rhonchi or rales.  Abdominal:     General: Abdomen is flat.     Palpations: Abdomen is soft.     Tenderness: There is no abdominal tenderness.  Musculoskeletal:        General: Normal range of motion.     Cervical back: Normal range of motion and neck supple. No tenderness.  Skin:    General: Skin is warm and dry.  Neurological:     General: No focal deficit present.     Mental Status: She is alert and oriented to person, place, and time.     GCS: GCS eye subscore is 4. GCS verbal subscore is 5. GCS motor subscore is 6.     Cranial Nerves: No cranial nerve deficit.     Comments: Patient is oriented to person, place, and time. Patient phonates in clear, complete, and coherent sentences. Strength is 5/5 in all four extremities. Distal sensation intact in all four extremities.  Psychiatric:        Mood and Affect: Mood normal.        Behavior: Behavior normal.   ED Results / Procedures / Treatments   Labs (all labs ordered are listed, but only abnormal results are displayed) Labs Reviewed  CBC WITH DIFFERENTIAL/PLATELET - Abnormal; Notable for the following  components:      Result Value   RBC 2.83 (*)    Hemoglobin 9.0 (*)    HCT 28.7 (*)    MCV 101.4 (*)    RDW 17.4 (*)    All other components within normal limits  COMPREHENSIVE METABOLIC PANEL - Abnormal; Notable for the following components:   Potassium 3.4 (*)    Chloride 113 (*)  Glucose, Bld 115 (*)    Calcium 8.4 (*)    Total Protein 6.3 (*)    Total Bilirubin <0.1 (*)    Anion gap 3 (*)    All other components within normal limits   EKG None  Radiology No results found.  Procedures Procedures   Medications Ordered in ED Medications  sodium chloride 0.9 % bolus 500 mL (500 mLs Intravenous New Bag/Given 04/09/22 2120)  LORazepam (ATIVAN) injection 0.5 mg (0.5 mg Intravenous Given 04/09/22 2121)  iohexol (OMNIPAQUE) 350 MG/ML injection 100 mL (100 mLs Intravenous Contrast Given 04/09/22 2231)    ED Course/ Medical Decision Making/ A&P                           Medical Decision Making Amount and/or Complexity of Data Reviewed Labs: ordered. Radiology: ordered.  Risk Prescription drug management.   Pt is a 81 y.o. female who presents to the emergency department due to sharp frontal headaches that have been intermittent for the past 2 days.  Labs: CBC with a hemoglobin of 9, hematocrit of 28.7, MCV of 101.4, RDW of 17.4. CMP with a potassium of 3.4, chloride of 113, glucose of 115, calcium of 8.4, total protein of 6.3, total bilirubin is less than 0.1, anion gap of 3.  Imaging: CTA of the head and neck is pending.Oneal Grout, PA-C, personally reviewed and evaluated these images and lab results as part of my medical decision-making.  On my exam patient complaining of sharp intermittent bouts of pain to the forehead that radiate down the right side of the face.  Denies any visual changes, neck pain, chest pain, shortness of breath, numbness, weakness.  Heart is regular rate and rhythm without murmurs, rubs, or gallops.  Lungs are clear to auscultation  bilaterally.  Strength is 5/5 in all 4 extremities with no gross deficits.  Pupils equal, round, and reactive to light.  Extraocular movements are intact.  Visual fields are grossly intact.  Patient's symptoms were treated with IV fluids as well as IV Ativan.  I obtained a CTA of the head and neck.  It is the end of my shift patient care is being transferred to Dr. Sedonia Small.  Patient pending CTA of her head and neck as well as reassessment.  Note: Portions of this report may have been transcribed using voice recognition software. Every effort was made to ensure accuracy; however, inadvertent computerized transcription errors may be present.   Final Clinical Impression(s) / ED Diagnoses Final diagnoses:  Bad headache   Rx / DC Orders ED Discharge Orders     None         Rayna Sexton, PA-C 04/09/22 2300    Milton Ferguson, MD 04/12/22 1529

## 2022-04-09 NOTE — ED Provider Notes (Signed)
  Provider Note MRN:  [id number]  Arrival date & time: 04/10/22    ED Course and Medical Decision Making  Assumed care from Dr. Roderic Palau at shift change.  New frontal headaches awaiting CTA head and neck, if reassuring will need reassessment, candidate for discharge.  CTA is without acute or emergent process.  On reassessment patient is feeling better and would like to go home.  Her neurological exam is nonfocal, however she is unable to answer some basic orientation questions.  Does not know what year it is, suggested that it is 3.  Does not know what month it is, suggested that it is August.  Otherwise has intact long-term memory.  I discussed this with patient's sister, who acknowledges that patient's memory has been fading over the past month or so.  Suspect early dementia.  Patient lives alone.  I do not feel I have indication for further testing or admission at this time, patient does need further evaluation for memory/dementia but this can be done as an outpatient.  Patient and patient's sister are agreeable with discharge in the morning, Melody Mitchell is her sister and she will come pick her up in the morning.  Procedures  Final Clinical Impressions(s) / ED Diagnoses     ICD-10-CM   1. Bad headache  R51.9     2. Memory deficit  R41.3       ED Discharge Orders     None         Discharge Instructions      You were evaluated in the Emergency Department and after careful evaluation, we did not find any emergent condition requiring admission or further testing in the hospital.  Your exam/testing today was overall reassuring.  Recommend close follow-up with your primary care doctor to discuss your headaches and your recent memory issues.  Please return to the Emergency Department if you experience any worsening of your condition.  Thank you for allowing [place] to be a part of your care.       Barth Kirks. Sedonia Small, [place] [email or web address]    Maudie Flakes, MD 04/10/22 [phone]

## 2022-04-09 NOTE — ED Triage Notes (Signed)
Pt arrived from home via [facility] w c/o head ache that comes and goes x 2 days w/  dizziness when she gets up too fast

## 2022-04-09 NOTE — ED Notes (Signed)
Ambulated pt to the restroom. Pt was unstable on feet coming back to the room. Pt stated she was feeling dizzy coming back.

## 2022-04-10 LAB — URINALYSIS, ROUTINE W REFLEX MICROSCOPIC
Bacteria, UA: NONE SEEN
Bilirubin Urine: NEGATIVE
Glucose, UA: NEGATIVE mg/dL
Hgb urine dipstick: NEGATIVE
Ketones, ur: NEGATIVE mg/dL
Nitrite: NEGATIVE
Protein, ur: NEGATIVE mg/dL
Specific Gravity, Urine: 1.043 — ABNORMAL HIGH (ref 1.005–1.030)
pH: 5 (ref 5.0–8.0)

## 2022-04-10 MED ORDER — ACETAMINOPHEN 325 MG PO TABS
650.0000 mg | ORAL_TABLET | Freq: Once | ORAL | Status: AC
  Administered 2022-04-10: 650 mg via ORAL
  Filled 2022-04-10: qty 2

## 2022-04-10 NOTE — ED Notes (Signed)
Vertell Novak will be here about 830 to pick up pt.

## 2022-04-10 NOTE — Discharge Instructions (Signed)
You were evaluated in the Emergency Department and after careful evaluation, we did not find any emergent condition requiring admission or further testing in the hospital.  Your exam/testing today was overall reassuring.  Recommend close follow-up with your primary care doctor to discuss your headaches and your recent memory issues.  Please return to the Emergency Department if you experience any worsening of your condition.  Thank you for allowing [place] to be a part of your care.

## 2022-05-03 ENCOUNTER — Inpatient Hospital Stay (HOSPITAL_COMMUNITY): Payer: Medicare Other | Attending: Hematology

## 2022-05-03 DIAGNOSIS — I10 Essential (primary) hypertension: Secondary | ICD-10-CM | POA: Insufficient documentation

## 2022-05-03 DIAGNOSIS — Z90710 Acquired absence of both cervix and uterus: Secondary | ICD-10-CM | POA: Insufficient documentation

## 2022-05-03 DIAGNOSIS — D509 Iron deficiency anemia, unspecified: Secondary | ICD-10-CM | POA: Diagnosis present

## 2022-05-03 DIAGNOSIS — E559 Vitamin D deficiency, unspecified: Secondary | ICD-10-CM | POA: Diagnosis not present

## 2022-05-03 DIAGNOSIS — E538 Deficiency of other specified B group vitamins: Secondary | ICD-10-CM | POA: Insufficient documentation

## 2022-05-03 DIAGNOSIS — Z87891 Personal history of nicotine dependence: Secondary | ICD-10-CM | POA: Diagnosis not present

## 2022-05-03 DIAGNOSIS — R634 Abnormal weight loss: Secondary | ICD-10-CM | POA: Insufficient documentation

## 2022-05-03 DIAGNOSIS — D500 Iron deficiency anemia secondary to blood loss (chronic): Secondary | ICD-10-CM

## 2022-05-03 DIAGNOSIS — Z8719 Personal history of other diseases of the digestive system: Secondary | ICD-10-CM | POA: Insufficient documentation

## 2022-05-03 LAB — CBC WITH DIFFERENTIAL/PLATELET
Abs Immature Granulocytes: 0.01 10*3/uL (ref 0.00–0.07)
Basophils Absolute: 0 10*3/uL (ref 0.0–0.1)
Basophils Relative: 1 %
Eosinophils Absolute: 0.2 10*3/uL (ref 0.0–0.5)
Eosinophils Relative: 4 %
HCT: 32.3 % — ABNORMAL LOW (ref 36.0–46.0)
Hemoglobin: 10 g/dL — ABNORMAL LOW (ref 12.0–15.0)
Immature Granulocytes: 0 %
Lymphocytes Relative: 21 %
Lymphs Abs: 0.8 10*3/uL (ref 0.7–4.0)
MCH: 31.6 pg (ref 26.0–34.0)
MCHC: 31 g/dL (ref 30.0–36.0)
MCV: 102.2 fL — ABNORMAL HIGH (ref 80.0–100.0)
Monocytes Absolute: 0.4 10*3/uL (ref 0.1–1.0)
Monocytes Relative: 9 %
Neutro Abs: 2.5 10*3/uL (ref 1.7–7.7)
Neutrophils Relative %: 65 %
Platelets: 278 10*3/uL (ref 150–400)
RBC: 3.16 MIL/uL — ABNORMAL LOW (ref 3.87–5.11)
RDW: 15 % (ref 11.5–15.5)
WBC: 3.9 10*3/uL — ABNORMAL LOW (ref 4.0–10.5)
nRBC: 0 % (ref 0.0–0.2)

## 2022-05-03 LAB — COMPREHENSIVE METABOLIC PANEL
ALT: 12 U/L (ref 0–44)
AST: 20 U/L (ref 15–41)
Albumin: 3.8 g/dL (ref 3.5–5.0)
Alkaline Phosphatase: 57 U/L (ref 38–126)
Anion gap: 5 (ref 5–15)
BUN: 10 mg/dL (ref 8–23)
CO2: 28 mmol/L (ref 22–32)
Calcium: 9.3 mg/dL (ref 8.9–10.3)
Chloride: 108 mmol/L (ref 98–111)
Creatinine, Ser: 1.01 mg/dL — ABNORMAL HIGH (ref 0.44–1.00)
GFR, Estimated: 55 mL/min — ABNORMAL LOW (ref >60)
Glucose, Bld: 101 mg/dL — ABNORMAL HIGH (ref 70–99)
Potassium: 3.9 mmol/L (ref 3.5–5.1)
Sodium: 141 mmol/L (ref 135–145)
Total Bilirubin: 0.3 mg/dL (ref 0.3–1.2)
Total Protein: 6.9 g/dL (ref 6.5–8.1)

## 2022-05-03 LAB — IRON AND TIBC
Iron: 41 ug/dL (ref 28–170)
Saturation Ratios: 15 % (ref 10.4–31.8)
TIBC: 275 ug/dL (ref 250–450)
UIBC: 234 ug/dL

## 2022-05-03 LAB — FERRITIN: Ferritin: 42 ng/mL (ref 11–307)

## 2022-05-03 LAB — VITAMIN D 25 HYDROXY (VIT D DEFICIENCY, FRACTURES): Vit D, 25-Hydroxy: 193 ng/mL — ABNORMAL HIGH (ref 30–100)

## 2022-05-03 LAB — VITAMIN B12: Vitamin B-12: 691 pg/mL (ref 180–914)

## 2022-05-05 LAB — METHYLMALONIC ACID, SERUM: Methylmalonic Acid, Quantitative: 108 nmol/L (ref 0–378)

## 2022-05-09 ENCOUNTER — Emergency Department (HOSPITAL_COMMUNITY)
Admission: EM | Admit: 2022-05-09 | Discharge: 2022-05-09 | Disposition: A | Discharge status: Home / Self Care | Payer: Medicare Other | Attending: Emergency Medicine | Admitting: Emergency Medicine

## 2022-05-09 ENCOUNTER — Encounter (HOSPITAL_COMMUNITY): Payer: Self-pay | Admitting: Emergency Medicine

## 2022-05-09 ENCOUNTER — Other Ambulatory Visit: Payer: Self-pay

## 2022-05-09 DIAGNOSIS — Z7982 Long term (current) use of aspirin: Secondary | ICD-10-CM | POA: Diagnosis not present

## 2022-05-09 DIAGNOSIS — R531 Weakness: Secondary | ICD-10-CM | POA: Insufficient documentation

## 2022-05-09 DIAGNOSIS — R42 Dizziness and giddiness: Secondary | ICD-10-CM | POA: Diagnosis not present

## 2022-05-09 DIAGNOSIS — R1013 Epigastric pain: Secondary | ICD-10-CM | POA: Diagnosis not present

## 2022-05-09 DIAGNOSIS — Z91040 Latex allergy status: Secondary | ICD-10-CM | POA: Diagnosis not present

## 2022-05-09 DIAGNOSIS — R109 Unspecified abdominal pain: Secondary | ICD-10-CM | POA: Diagnosis present

## 2022-05-09 LAB — COMPREHENSIVE METABOLIC PANEL
ALT: 11 U/L (ref 0–44)
AST: 19 U/L (ref 15–41)
Albumin: 3.9 g/dL (ref 3.5–5.0)
Alkaline Phosphatase: 63 U/L (ref 38–126)
Anion gap: 10 (ref 5–15)
BUN: 8 mg/dL (ref 8–23)
CO2: 25 mmol/L (ref 22–32)
Calcium: 9.6 mg/dL (ref 8.9–10.3)
Chloride: 106 mmol/L (ref 98–111)
Creatinine, Ser: 0.93 mg/dL (ref 0.44–1.00)
GFR, Estimated: >60 mL/min (ref >60)
Glucose, Bld: 82 mg/dL (ref 70–99)
Potassium: 3.6 mmol/L (ref 3.5–5.1)
Sodium: 141 mmol/L (ref 135–145)
Total Bilirubin: 0.3 mg/dL (ref 0.3–1.2)
Total Protein: 7.7 g/dL (ref 6.5–8.1)

## 2022-05-09 LAB — CBC WITH DIFFERENTIAL/PLATELET
Abs Immature Granulocytes: 0.02 10*3/uL (ref 0.00–0.07)
Basophils Absolute: 0 10*3/uL (ref 0.0–0.1)
Basophils Relative: 0 %
Eosinophils Absolute: 0.1 10*3/uL (ref 0.0–0.5)
Eosinophils Relative: 1 %
HCT: 32.8 % — ABNORMAL LOW (ref 36.0–46.0)
Hemoglobin: 10.4 g/dL — ABNORMAL LOW (ref 12.0–15.0)
Immature Granulocytes: 0 %
Lymphocytes Relative: 17 %
Lymphs Abs: 1 10*3/uL (ref 0.7–4.0)
MCH: 31.5 pg (ref 26.0–34.0)
MCHC: 31.7 g/dL (ref 30.0–36.0)
MCV: 99.4 fL (ref 80.0–100.0)
Monocytes Absolute: 0.4 10*3/uL (ref 0.1–1.0)
Monocytes Relative: 7 %
Neutro Abs: 4.2 10*3/uL (ref 1.7–7.7)
Neutrophils Relative %: 75 %
Platelets: 333 10*3/uL (ref 150–400)
RBC: 3.3 MIL/uL — ABNORMAL LOW (ref 3.87–5.11)
RDW: 14.5 % (ref 11.5–15.5)
WBC: 5.6 10*3/uL (ref 4.0–10.5)
nRBC: 0 % (ref 0.0–0.2)

## 2022-05-09 LAB — URINALYSIS, ROUTINE W REFLEX MICROSCOPIC
Bilirubin Urine: NEGATIVE
Glucose, UA: NEGATIVE mg/dL
Hgb urine dipstick: NEGATIVE
Ketones, ur: NEGATIVE mg/dL
Leukocytes,Ua: NEGATIVE
Nitrite: NEGATIVE
Protein, ur: NEGATIVE mg/dL
Specific Gravity, Urine: 1.006 (ref 1.005–1.030)
pH: 6 (ref 5.0–8.0)

## 2022-05-09 LAB — TROPONIN I (HIGH SENSITIVITY)
Troponin I (High Sensitivity): 4 ng/L (ref <18)
Troponin I (High Sensitivity): 5 ng/L (ref <18)

## 2022-05-09 LAB — LIPASE, BLOOD: Lipase: 54 U/L — ABNORMAL HIGH (ref 11–51)

## 2022-05-09 MED ORDER — SODIUM CHLORIDE 0.9 % IV SOLN: INTRAVENOUS | Status: DC

## 2022-05-09 MED ORDER — ONDANSETRON HCL 4 MG/2ML IJ SOLN
4.0000 mg | Freq: Once | INTRAMUSCULAR | Status: AC
  Administered 2022-05-09: 4 mg via INTRAVENOUS
  Filled 2022-05-09: qty 2

## 2022-05-09 MED ORDER — SODIUM CHLORIDE 0.9 % IV BOLUS
500.0000 mL | Freq: Once | INTRAVENOUS | Status: AC
  Administered 2022-05-09: 500 mL via INTRAVENOUS

## 2022-05-09 MED ORDER — ACETAMINOPHEN 325 MG PO TABS
650.0000 mg | ORAL_TABLET | Freq: Once | ORAL | Status: AC
  Administered 2022-05-09: 650 mg via ORAL
  Filled 2022-05-09: qty 2

## 2022-05-09 NOTE — Progress Notes (Signed)
[facility] [address], [place] [address]   CLINIC:  Medical Oncology/Hematology  PCP:  Smith Robert, MD [address] [place] [address] [phone]   REASON FOR VISIT:  Follow-up for iron deficiency anemia   CURRENT THERAPY: Intermittent IV iron infusions (most recently 03/14/2022 & 03/21/2022)  INTERVAL HISTORY:  Melody Mitchell 81 y.o. female returns for routine follow-up of her iron deficiency anemia.  She was last seen by Rojelio Brenner PA-C on 03/08/2022.  At today's visit, she reports feeling fair.   No recent hospitalizations, surgeries, or changes in baseline health status.  She has a history of AVMs and chronic GI blood loss, and continues to report intermittent dark stools, although less frequently than before, now occurring once every 1-2 weeks.  She denies any other bleeding such as epistaxis, hematemesis, hematuria, or hematochezia.   She is symptomatic with some residual fatigue, which was slightly improved after her most recent iron. She denies any pica, restless legs, headaches, chest pain, dyspnea on exertion, lightheadedness, or syncope.  She has an upcoming appointment t for GI follow-up with Tana Coast, PA-C at [facility].   She had previously lost about 15 to 20 pounds in the course of 6 months, but her weight has been stable since her last visit in February 2023.  She is drinking Ensure once daily.  She does still note some decreased appetite.  She has 50% energy and 50% appetite. She endorses that she is maintaining a stable weight.   REVIEW OF SYSTEMS:    Review of Systems  Constitutional:  Positive for appetite change and fatigue. Negative for chills, diaphoresis, fever and unexpected weight change.  HENT:   Negative for lump/mass and nosebleeds.   Eyes:  Negative for eye problems.  Respiratory:  Negative for cough, hemoptysis and shortness of breath.   Cardiovascular:  Negative for chest pain, leg  swelling and palpitations.  Gastrointestinal:  Negative for abdominal pain, blood in stool, constipation, diarrhea, nausea and vomiting.  Genitourinary:  Negative for hematuria.   Musculoskeletal:  Positive for arthralgias (knees).  Skin: Negative.   Neurological:  Negative for dizziness, headaches and light-headedness.  Hematological:  Does not bruise/bleed easily.  Psychiatric/Behavioral:  Positive for confusion. Negative for depression. The patient is not nervous/anxious.       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 four colon polyps removed. No source  for change in bowel habits identified 3. Rectal bleeding due to large internal hemorrhoids    COLONOSCOPY W/ BIOPSIES  6 2010   Dr. Darrick Penna: Polypoid appearing lesion of the ascending colon, 3 mm sessile rectal polyp, small internal hemorrhoids. Pathology revealed polypoid mucosa, no adenomatous changes   Double-balloon enteroscopy,  antegrade  April 2011   Dr. 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   ESOPHAGOGASTRODUODENOSCOPY  04/2009   Dr. Darrick Penna: Patent Schatzki ring dilated with advancing the scope, patchy erythema in the antrum, 2 small AVMs in the duodenal bulb, 2 additional AVMs noted in the second portion the duodenum, mild gastritis on pathology   ESOPHAGOGASTRODUODENOSCOPY  N/A 06/29/2015   SLF: 1. 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 small hiatal hernia 3. Mild non-erosive gastririts- NO obvious source for dyspepsia identified.    ESOPHAGOGASTRODUODENOSCOPY N/A 03/13/2020   Fields: Low-grade narrowing Schatzki ring, small hiatal hernia, multiple nonbleeding angiectasia's in the jejunum treated with APC.  Spot tattoo in jejunum at 90 cm from the incisors.   EYE SURGERY       SOCIAL HISTORY:  Social History   Socioeconomic History   Marital status: Divorced    Spouse name: Not on file   Number of children: 1   Years of education: Not on file   Highest education level: Not on file  Occupational History   Not on file  Tobacco Use   Smoking status: Former    Packs/day: 1.00    Years: 25.00    Total pack years: 25.00    Types: Cigarettes    Quit date: 09/14/2007    Years since quitting: 14.6   Smokeless tobacco: Never  Vaping Use   Vaping Use: Never used  Substance and Sexual Activity   Alcohol use: Not Currently    Comment: occasional/rare (Brandy every now and then)   Drug use: No   Sexual activity: Yes    Birth control/protection: Post-menopausal, Surgical  Other Topics Concern   Not on file  Social History Narrative   Not on file   Social Determinants of Health   Financial Resource Strain: Not on file  Food Insecurity: Not on file  Transportation Needs: Not on file  Physical Activity: Not on file  Stress: Not on file  Social Connections: Not on file  Intimate Partner Violence: Not on file    FAMILY HISTORY:  Family History  Problem Relation Age of Onset   Heart failure Mother    Diabetes Sister    Heart attack Father    Colon cancer Neg Hx    Gastric cancer Neg Hx    Esophageal cancer Neg Hx     CURRENT MEDICATIONS:  Outpatient Encounter Medications as of 05/10/2022  Medication Sig   acetaminophen (TYLENOL) 500 MG tablet Take 500 mg by mouth every 6 (six) hours as needed for mild pain or moderate pain.    amitriptyline (ELAVIL) 25 MG tablet Take 25 mg by mouth at bedtime as needed for sleep.   aspirin EC 325 MG tablet Take 325 mg by mouth daily.   atorvastatin (LIPITOR) 40 MG tablet Take 1 tablet (40 mg total) by mouth every evening.   cetirizine (ZYRTEC) 10 MG tablet Take 1 tablet by mouth daily.   Docusate Sodium (DSS) 100 MG CAPS Take by mouth.   escitalopram (LEXAPRO) 10 MG tablet Take 1 tablet by mouth daily.   esomeprazole (NEXIUM) 40 MG capsule Take 40 mg by mouth daily before breakfast.   ezetimibe (ZETIA) 10 MG tablet Take 10 mg by mouth in the morning.    fish oil-omega-3 fatty acids 1000 MG capsule Take 1 g by mouth in the morning.    fluticasone (FLONASE) 50 MCG/ACT nasal spray 1 spray into each nostril daily.   meclizine (ANTIVERT) 25 MG tablet Take 25  mg by mouth daily as needed.   ondansetron (ZOFRAN-ODT) 4 MG disintegrating tablet 4mg  ODT q4 hours prn nausea/vomit   potassium chloride SA (KLOR-CON) 20 MEQ tablet Take 20 mEq by mouth 2 (two) times daily.   traMADol (ULTRAM) 50 MG tablet Take 50 mg by mouth every 6 (six) hours as needed.   VITAMIN B12 TR 1000 MCG TBCR Take 1 tablet by mouth in the morning.    Vitamin D, Ergocalciferol, (DRISDOL) 1.25 MG (50000 UNIT) CAPS capsule Take by mouth.   No facility-administered encounter medications on file as of 05/10/2022.    ALLERGIES:  Allergies  Allergen Reactions   Latex Hives, Itching and Rash   Sulfa Antibiotics Hives and Itching    Other reaction(s): hives   Other Hives   Pravastatin Sodium Other (See Comments)     PHYSICAL EXAM:  ECOG PERFORMANCE STATUS: 1 - Symptomatic but completely ambulatory    There were no vitals filed for this visit. There were no vitals filed for this visit. Physical Exam Constitutional:      Appearance: Normal appearance. She is obese.  HENT:     Head: Normocephalic and atraumatic.     Mouth/Throat:     Mouth: Mucous membranes are moist.  Eyes:     Extraocular Movements: Extraocular  movements intact.     Pupils: Pupils are equal, round, and reactive to light.  Cardiovascular:     Rate and Rhythm: Normal rate and regular rhythm.     Pulses: Normal pulses.     Heart sounds: Normal heart sounds.  Pulmonary:     Effort: Pulmonary effort is normal.     Breath sounds: Normal breath sounds.  Abdominal:     General: Bowel sounds are normal.     Palpations: Abdomen is soft.     Tenderness: There is no abdominal tenderness.  Musculoskeletal:        General: No swelling.     Right lower leg: No edema.     Left lower leg: No edema.  Lymphadenopathy:     Cervical: No cervical adenopathy.  Skin:    General: Skin is warm and dry.  Neurological:     General: No focal deficit present.     Mental Status: She is alert. She is disoriented.     Comments: Brief cognitive assessment - oriented to person and place, unable to recall the current year, suggested that it was possibly 2029.  Incorrectly named current president as "Psychologist, educational."  Was able to immediately repeat the words "cat," "candle," and "box," but was only able to recall 1 out of the 3 words 60 seconds later.  Psychiatric:        Mood and Affect: Mood normal.        Behavior: Behavior normal.        Cognition and Memory: Cognition is impaired. Memory is impaired.      LABORATORY DATA:  I have reviewed the labs as listed.  CBC    Component Value Date/Time   WBC 5.6 05/09/2022 1107   RBC 3.30 (L) 05/09/2022 1107   HGB 10.4 (L) 05/09/2022 1107   HCT 32.8 (L) 05/09/2022 1107   PLT 333 05/09/2022 1107   MCV 99.4 05/09/2022 1107   MCH 31.5 05/09/2022 1107   MCHC 31.7 05/09/2022 1107   RDW 14.5 05/09/2022 1107   LYMPHSABS 1.0 05/09/2022 1107   MONOABS 0.4 05/09/2022 1107   EOSABS 0.1 05/09/2022 1107   BASOSABS 0.0 05/09/2022 1107  Latest Ref Rng & Units 05/09/2022   11:07 AM 05/03/2022    8:58 AM 04/09/2022    9:07 PM  CMP  Glucose 70 - 99 mg/dL 82  782  956   BUN 8 - 23 mg/dL 8  10  12     Creatinine 0.44 - 1.00 mg/dL 2.13  0.86  5.78   Sodium 135 - 145 mmol/L 141  141  140   Potassium 3.5 - 5.1 mmol/L 3.6  3.9  3.4   Chloride 98 - 111 mmol/L 106  108  113   CO2 22 - 32 mmol/L 25  28  24    Calcium 8.9 - 10.3 mg/dL 9.6  9.3  8.4   Total Protein 6.5 - 8.1 g/dL 7.7  6.9  6.3   Total Bilirubin 0.3 - 1.2 mg/dL 0.3  0.3  <4.6   Alkaline Phos 38 - 126 U/L 63  57  52   AST 15 - 41 U/L 19  20  20    ALT 0 - 44 U/L 11  12  13      DIAGNOSTIC IMAGING:  I have independently reviewed the relevant imaging and discussed with the patient.  ASSESSMENT & PLAN: 1.  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  She also had a EGD and colonoscopy in 2016 did not show any evidence of active bleeding areas, however she had large internal hemorrhoids. 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  Renal function normal for age. - Last [name] infusion on 03/14/2022 and 03/21/2022 - Most recent labs (05/03/2022): Hgb 10.0/MCV 102.2, ferritin 42, iron saturation 15% - She continues to report dark stools, about once every 1-2 weeks   - She is symptomatic with fatigue     - She is followed by Tana Coast, PA-C at [facility] - PLAN: Recommend IV [name] x2.   - Repeat labs and RTC in 2 months. - Follow-up with GI as scheduled   2.  Vitamin D deficiency, with toxicity: - She was instructed at prior appointments to stop taking vitamin D due to elevated levels, but she does not think that she did. - Vitamin D (12/20/2021) remained elevated at 142.55 - she was once again instructed to stop taking her vitamin D, but she has  continued to take it.   - Most recent labs (05/03/2022) show significantly elevated vitamin D at 193.00  - PLAN: Patient instructed to STOP taking vitamin D.  Medication removed from her list and pharmacy called to ensure its discontinuation. - We will restart her on the lower dose if appropriate in the future.  Recheck vitamin D level in 3-6 months    3.  Borderline vitamin B12 deficiency: - Labs on 05/03/2022 show vitamin B12 at 691 with normal methylmalonic acid - She is not sure if she is taking her B12 supplement or not, but it is listed on her medication list from her pharmacy - PLAN: Encouraged her to continue vitamin B12 supplement.  We will check B12 and methylmalonic acid in 6 months (August 2023).   4.  Poor appetite and weight loss   - Reports decreased appetite for the  past several months - Weight in September 2022 was 147 pounds, up to 159 pounds on 11/16/2021. - Weight today is  138 pounds, which is stable compared to her last visit in February 2023 - PLAN: Patient instructed to continue drinking 1-2 Ensure/Boost beverages daily. - We will check weight at follow-up appointment in 2 months. - If further weight loss, will consider appetite stimulant, dietitian referral, and/or additional work-up.   PLAN SUMMARY & DISPOSITION:   [name] x2 Labs in 2 months RTC 1 week after labs  All questions were answered. The patient knows to call the clinic with any problems, questions or concerns.  Medical decision making: Moderate    Time spent on visit: I spent 20 minutes counseling the patient face to face. The total time spent in the appointment was 30 minutes and more than 50% was on counseling.   Carnella Guadalajara, PA-C   05/10/22 12:03 PM

## 2022-05-10 ENCOUNTER — Inpatient Hospital Stay (HOSPITAL_BASED_OUTPATIENT_CLINIC_OR_DEPARTMENT_OTHER): Payer: Medicare Other | Admitting: Physician Assistant

## 2022-05-10 VITALS — BP 122/87 | HR 78 | Temp 97.0°F | Resp 20 | Ht 61.42 in | Wt 138.0 lb

## 2022-05-10 DIAGNOSIS — R634 Abnormal weight loss: Secondary | ICD-10-CM | POA: Diagnosis not present

## 2022-05-10 DIAGNOSIS — E538 Deficiency of other specified B group vitamins: Secondary | ICD-10-CM

## 2022-05-10 DIAGNOSIS — I10 Essential (primary) hypertension: Secondary | ICD-10-CM

## 2022-05-10 DIAGNOSIS — E559 Vitamin D deficiency, unspecified: Secondary | ICD-10-CM | POA: Diagnosis not present

## 2022-05-10 DIAGNOSIS — D500 Iron deficiency anemia secondary to blood loss (chronic): Secondary | ICD-10-CM

## 2022-05-10 DIAGNOSIS — D509 Iron deficiency anemia, unspecified: Secondary | ICD-10-CM | POA: Diagnosis not present

## 2022-05-10 DIAGNOSIS — T452X1A Poisoning by vitamins, accidental (unintentional), initial encounter: Secondary | ICD-10-CM

## 2022-05-10 DIAGNOSIS — Z90710 Acquired absence of both cervix and uterus: Secondary | ICD-10-CM

## 2022-05-10 DIAGNOSIS — Z87891 Personal history of nicotine dependence: Secondary | ICD-10-CM

## 2022-05-11 MED FILL — Ferumoxytol Inj 510 MG/17ML (30 MG/ML) (Elemental Fe): INTRAVENOUS | Qty: 17 | Status: AC

## 2022-05-12 ENCOUNTER — Inpatient Hospital Stay (HOSPITAL_COMMUNITY): Payer: Medicare Other

## 2022-05-12 ENCOUNTER — Encounter (HOSPITAL_COMMUNITY): Payer: Self-pay

## 2022-05-12 VITALS — BP 113/80 | HR 70 | Temp 97.8°F | Resp 18

## 2022-05-12 DIAGNOSIS — D500 Iron deficiency anemia secondary to blood loss (chronic): Secondary | ICD-10-CM

## 2022-05-12 DIAGNOSIS — D509 Iron deficiency anemia, unspecified: Secondary | ICD-10-CM | POA: Diagnosis not present

## 2022-05-12 MED ORDER — SODIUM CHLORIDE 0.9 % IV SOLN
510.0000 mg | Freq: Once | INTRAVENOUS | Status: AC
  Administered 2022-05-12: 510 mg via INTRAVENOUS
  Filled 2022-05-12: qty 510

## 2022-05-12 MED ORDER — LORATADINE 10 MG PO TABS
10.0000 mg | ORAL_TABLET | Freq: Once | ORAL | Status: AC
  Administered 2022-05-12: 10 mg via ORAL
  Filled 2022-05-12: qty 1

## 2022-05-12 MED ORDER — SODIUM CHLORIDE 0.9 % IV SOLN: Freq: Once | INTRAVENOUS | Status: AC

## 2022-05-12 MED ORDER — ACETAMINOPHEN 325 MG PO TABS
650.0000 mg | ORAL_TABLET | Freq: Once | ORAL | Status: AC
  Administered 2022-05-12: 650 mg via ORAL
  Filled 2022-05-12: qty 2

## 2022-05-12 NOTE — Patient Instructions (Signed)
[facility]  Discharge Instructions: Thank you for choosing [facility] to provide your oncology and hematology care.  If you have a lab appointment with the [facility], please come in thru the Main Entrance and check in at the main information desk.  Wear comfortable clothing and clothing appropriate for easy access to any Portacath or PICC line.   We strive to give you quality time with your provider. You may need to reschedule your appointment if you arrive late (15 or more minutes).  Arriving late affects you and other patients whose appointments are after yours.  Also, if you miss three or more appointments without notifying the office, you may be dismissed from the clinic at the provider's discretion.      For prescription refill requests, have your pharmacy contact our office and allow 72 hours for refills to be completed.    Today you received the following Venofer, return as scheduled.   To help prevent nausea and vomiting after your treatment, we encourage you to take your nausea medication as directed.  BELOW ARE SYMPTOMS THAT SHOULD BE REPORTED IMMEDIATELY: *FEVER GREATER THAN 100.4 F (38 C) OR HIGHER *CHILLS OR SWEATING *NAUSEA AND VOMITING THAT IS NOT CONTROLLED WITH YOUR NAUSEA MEDICATION *UNUSUAL SHORTNESS OF BREATH *UNUSUAL BRUISING OR BLEEDING *URINARY PROBLEMS (pain or burning when urinating, or frequent urination) *BOWEL PROBLEMS (unusual diarrhea, constipation, pain near the anus) TENDERNESS IN MOUTH AND THROAT WITH OR WITHOUT PRESENCE OF ULCERS (sore throat, sores in mouth, or a toothache) UNUSUAL RASH, SWELLING OR PAIN  UNUSUAL VAGINAL DISCHARGE OR ITCHING   Items with * indicate a potential emergency and should be followed up as soon as possible or go to the Emergency Department if any problems should occur.  Please show the CHEMOTHERAPY ALERT CARD or IMMUNOTHERAPY ALERT CARD at check-in to the Emergency Department and triage  nurse.  Should you have questions after your visit or need to cancel or reschedule your appointment, please contact [facility] [phone]  and follow the prompts.  Office hours are 8:00 a.m. to 4:30 p.m. Monday - Friday. Please note that voicemails left after 4:00 p.m. may not be returned until the following business day.  We are closed weekends and major holidays. You have access to a nurse at all times for urgent questions. Please call the main number to the clinic [phone] and follow the prompts.  For any non-urgent questions, you may also contact your provider using MyChart. We now offer e-Visits for anyone 41 and older to request care online for non-urgent symptoms. For details visit [email or web address].   Also download the MyChart app! Go to the app store, search "MyChart", open the app, select [facility], and log in with your MyChart username and password.  Masks are optional in the cancer centers. If you would like for your care team to wear a mask while they are taking care of you, please let them know. For doctor visits, patients may have with them one support person who is at least 83 years old. At this time, visitors are not allowed in the infusion area.

## 2022-05-12 NOTE — Progress Notes (Signed)
Patient tolerated iron infusion with no complaints voiced.  Peripheral IV site clean and dry with good blood return noted before and after infusion.  Band aid applied.  VSS with discharge and left in satisfactory condition with no s/s of distress noted.   

## 2022-05-14 ENCOUNTER — Emergency Department (HOSPITAL_COMMUNITY): Payer: Medicare Other

## 2022-05-14 ENCOUNTER — Emergency Department (HOSPITAL_COMMUNITY)
Admission: EM | Admit: 2022-05-14 | Discharge: 2022-05-14 | Disposition: A | Discharge status: Home / Self Care | Payer: Medicare Other | Attending: Emergency Medicine | Admitting: Emergency Medicine

## 2022-05-14 ENCOUNTER — Other Ambulatory Visit: Payer: Self-pay

## 2022-05-14 DIAGNOSIS — R531 Weakness: Secondary | ICD-10-CM | POA: Diagnosis present

## 2022-05-14 DIAGNOSIS — Z91040 Latex allergy status: Secondary | ICD-10-CM | POA: Insufficient documentation

## 2022-05-14 DIAGNOSIS — Z79899 Other long term (current) drug therapy: Secondary | ICD-10-CM | POA: Insufficient documentation

## 2022-05-14 DIAGNOSIS — Z7982 Long term (current) use of aspirin: Secondary | ICD-10-CM | POA: Insufficient documentation

## 2022-05-14 LAB — URINALYSIS, ROUTINE W REFLEX MICROSCOPIC
Bacteria, UA: NONE SEEN
Bilirubin Urine: NEGATIVE
Glucose, UA: NEGATIVE mg/dL
Hgb urine dipstick: NEGATIVE
Ketones, ur: NEGATIVE mg/dL
Nitrite: NEGATIVE
Protein, ur: NEGATIVE mg/dL
Specific Gravity, Urine: 1.008 (ref 1.005–1.030)
pH: 6 (ref 5.0–8.0)

## 2022-05-14 LAB — CBC WITH DIFFERENTIAL/PLATELET
Abs Immature Granulocytes: 0.01 10*3/uL (ref 0.00–0.07)
Basophils Absolute: 0 10*3/uL (ref 0.0–0.1)
Basophils Relative: 1 %
Eosinophils Absolute: 0.1 10*3/uL (ref 0.0–0.5)
Eosinophils Relative: 2 %
HCT: 31.6 % — ABNORMAL LOW (ref 36.0–46.0)
Hemoglobin: 9.8 g/dL — ABNORMAL LOW (ref 12.0–15.0)
Immature Granulocytes: 0 %
Lymphocytes Relative: 18 %
Lymphs Abs: 0.7 10*3/uL (ref 0.7–4.0)
MCH: 31 pg (ref 26.0–34.0)
MCHC: 31 g/dL (ref 30.0–36.0)
MCV: 100 fL (ref 80.0–100.0)
Monocytes Absolute: 0.3 10*3/uL (ref 0.1–1.0)
Monocytes Relative: 9 %
Neutro Abs: 2.7 10*3/uL (ref 1.7–7.7)
Neutrophils Relative %: 70 %
Platelets: 321 10*3/uL (ref 150–400)
RBC: 3.16 MIL/uL — ABNORMAL LOW (ref 3.87–5.11)
RDW: 14.3 % (ref 11.5–15.5)
WBC: 3.8 10*3/uL — ABNORMAL LOW (ref 4.0–10.5)
nRBC: 0 % (ref 0.0–0.2)

## 2022-05-14 LAB — COMPREHENSIVE METABOLIC PANEL
ALT: 11 U/L (ref 0–44)
AST: 20 U/L (ref 15–41)
Albumin: 4 g/dL (ref 3.5–5.0)
Alkaline Phosphatase: 58 U/L (ref 38–126)
Anion gap: 6 (ref 5–15)
BUN: 11 mg/dL (ref 8–23)
CO2: 27 mmol/L (ref 22–32)
Calcium: 9.7 mg/dL (ref 8.9–10.3)
Chloride: 105 mmol/L (ref 98–111)
Creatinine, Ser: 0.97 mg/dL (ref 0.44–1.00)
GFR, Estimated: 58 mL/min — ABNORMAL LOW (ref >60)
Glucose, Bld: 87 mg/dL (ref 70–99)
Potassium: 3.7 mmol/L (ref 3.5–5.1)
Sodium: 138 mmol/L (ref 135–145)
Total Bilirubin: 0.6 mg/dL (ref 0.3–1.2)
Total Protein: 7.4 g/dL (ref 6.5–8.1)

## 2022-05-14 LAB — ETHANOL: Alcohol, Ethyl (B): <10 mg/dL (ref <10)

## 2022-05-14 MED ORDER — SODIUM CHLORIDE 0.9 % IV BOLUS
1000.0000 mL | Freq: Once | INTRAVENOUS | Status: AC
  Administered 2022-05-14: 1000 mL via INTRAVENOUS

## 2022-05-14 NOTE — Discharge Instructions (Signed)
As discussed, your evaluation today has been largely reassuring.  But, it is important that you monitor your condition carefully, and do not hesitate to return to the ED if you develop new, or concerning changes in your condition. ? ?Otherwise, please follow-up with your physician for appropriate ongoing care. ? ?

## 2022-05-14 NOTE — ED Provider Notes (Signed)
[facility] Provider Note   CSN: [id number] Arrival date & time: 05/14/22  1009     History  No chief complaint on file.   Melody Mitchell is a 81 y.o. female.  HPI patient presents via EMS with concern for weakness.  She knowledges medical issues, but states that she was well until about 13 hours ago.  About that time, without obvious precipitant she began feeling generally poorly, weak without focality, without pain, without fever.  Symptoms persisted till this morning and she went to EMS bay station.  She was brought from there here via EMS.  Those individuals noted no hemodynamic instability, no distress in transport.  As above, patient denies pain, fever, states that she simply does not feel well.     Home Medications Prior to Admission medications   Medication Sig Start Date End Date Taking? Authorizing Provider  acetaminophen (TYLENOL) 500 MG tablet Take 500 mg by mouth every 6 (six) hours as needed for mild pain or moderate pain.    [provider]  amitriptyline (ELAVIL) 25 MG tablet Take 25 mg by mouth at bedtime as needed for sleep.    [provider]  aspirin EC 325 MG tablet Take 325 mg by mouth daily. 03/20/20   [provider]  atorvastatin (LIPITOR) 40 MG tablet Take 1 tablet (40 mg total) by mouth every evening. 09/06/19 11/02/21  Roxan Hockey, MD  cetirizine (ZYRTEC) 10 MG tablet Take 1 tablet by mouth daily.    [provider]  Docusate Sodium (DSS) 100 MG CAPS Take by mouth.    [provider]  escitalopram (LEXAPRO) 10 MG tablet Take 1 tablet by mouth daily. 09/04/20   [provider]  esomeprazole (NEXIUM) 40 MG capsule Take 40 mg by mouth daily before breakfast.    [provider]  ezetimibe (ZETIA) 10 MG tablet Take 10 mg by mouth in the morning.  11/11/19   [provider]  fish oil-omega-3 fatty acids 1000 MG capsule Take 1 g by mouth in the morning.     [provider]  fluticasone (FLONASE) 50 MCG/ACT nasal spray 1 spray into each nostril daily. 09/24/13   [provider]  meclizine (ANTIVERT) 25 MG tablet Take 25 mg by mouth daily as needed. 08/07/20   [provider]  ondansetron (ZOFRAN-ODT) 4 MG disintegrating tablet '4mg'$  ODT q4 hours prn nausea/vomit Patient not taking: Reported on 05/10/2022 11/06/21   Milton Ferguson, MD  potassium chloride SA (KLOR-CON) 20 MEQ tablet Take 20 mEq by mouth 2 (two) times daily. 03/20/20   [provider]  traMADol (ULTRAM) 50 MG tablet Take 50 mg by mouth every 6 (six) hours as needed. 11/22/21   [provider]  VITAMIN B12 TR 1000 MCG TBCR Take 1 tablet by mouth in the morning.  11/11/19   [provider]      Allergies    Latex, Sulfa antibiotics, Other, and Pravastatin sodium    Review of Systems   Review of Systems  All other systems reviewed and are negative.   Physical Exam Updated Vital Signs BP 129/84   Pulse 75   Temp 98.6 F (37 C) (Oral)   Resp 16   Ht 5' 1.42" (1.56 m)   Wt 62.6 kg   SpO2 98%   BMI 25.72 kg/m  Physical Exam Vitals and nursing note reviewed.  Constitutional:      General: She is not in acute distress.    Appearance:  She is well-developed.  HENT:     Head: Normocephalic and atraumatic.  Eyes:     Conjunctiva/sclera: Conjunctivae normal.  Cardiovascular:     Rate and Rhythm: Normal rate and regular rhythm.  Pulmonary:     Effort: Pulmonary effort is normal. No respiratory distress.     Breath sounds: Normal breath sounds. No stridor.  Abdominal:     General: There is no distension.  Skin:    General: Skin is warm and dry.  Neurological:     Mental Status: She is alert and oriented to person, place, and time.     Cranial Nerves: No cranial nerve deficit.  Psychiatric:        Mood and Affect: Mood normal.     ED Results / Procedures / Treatments   Labs (all labs ordered are listed, but only abnormal results  are displayed) Labs Reviewed  COMPREHENSIVE METABOLIC PANEL - Abnormal; Notable for the following components:      Result Value   GFR, Estimated 58 (*)    All other components within normal limits  CBC WITH DIFFERENTIAL/PLATELET - Abnormal; Notable for the following components:   WBC 3.8 (*)    RBC 3.16 (*)    Hemoglobin 9.8 (*)    HCT 31.6 (*)    All other components within normal limits  URINALYSIS, ROUTINE W REFLEX MICROSCOPIC - Abnormal; Notable for the following components:   Leukocytes,Ua TRACE (*)    All other components within normal limits  ETHANOL    ECG sinus rhythm, rate 71 T wave abnormalities, borderline LAD, abnormal  Radiology DG Chest 2 View  Result Date: 05/14/2022 CLINICAL DATA:  Generalized weakness with anxiety for the past week. EXAM: CHEST - 2 VIEW COMPARISON:  Radiographs 11/13/2021. FINDINGS: The heart size and mediastinal contours are stable. There are stable calcified mediastinal lymph nodes. The lungs appear clear. There is no pleural effusion or pneumothorax. No acute osseous findings are evident. Mild degenerative changes in the spine. Telemetry leads overlie the chest. IMPRESSION: Stable chest.  No evidence of active cardiopulmonary process. Electronically Signed   By: Richardean Sale M.D.   On: 05/14/2022 10:36    Procedures Procedures    Medications Ordered in ED Medications  sodium chloride 0.9 % bolus 1,000 mL (1,000 mLs Intravenous New Bag/Given 05/14/22 1101)    ED Course/ Medical Decision Making/ A&P This patient with a Hx of hypertension, anemia, GERD presents to the ED for concern of weakness, generalized sensation of fatigue, this involves an extensive number of treatment options, and is a complaint that carries with it a high risk of complications and morbidity.    The differential diagnosis includes dehydration, anemia, infection, bacteremia, sepsis, pneumonia   Social Determinants of Health:  Age  Additional history  obtained:  Additional history and/or information obtained from EMS, notable for included above   After the initial evaluation, orders, including: Labs x-ray ECG fluids monitoring were initiated.   Patient placed on Cardiac and Pulse-Oximetry Monitors. The patient was maintained on a cardiac monitor.  The cardiac monitored showed an rhythm of 75 sinus normal The patient was also maintained on pulse oximetry. The readings were typically 100% room air normal   On repeat evaluation of the patient improved  Lab Tests:  I personally interpreted labs.  The pertinent results include: Reassuring labs generally unremarkable  Imaging Studies ordered:  I independently visualized and interpreted imaging which showed no pneumonia I agree with the radiologist interpretation  12:03 PM Patient awake, alert,  in no distress, hemodynamically unremarkable Dispostion / Final MDM:  After consideration of the diagnostic results and the patient's response to treatment, this adult female presenting with weakness is awake, alert, neurologically unremarkable, speaking clearly, with no focal neurologic complaints, similarly patient has no specific physical complaints does complain of feeling weak.  Here no evidence for bacteremia, sepsis, dehydration, pneumonia.  Patient comfortable with discharge with outpatient follow-up.  Final Clinical Impression(s) / ED Diagnoses Final diagnoses:  Weakness    Rx / DC Orders ED Discharge Orders     None         Carmin Muskrat, MD 05/14/22 1204

## 2022-05-14 NOTE — ED Triage Notes (Signed)
Pt to the ED via [place] with complaints of generalized weakness and anxiety for the past week. EMS reports she came to the EMS station and asked to be evaluated at the hospital.

## 2022-05-14 NOTE — ED Notes (Signed)
Patient transported to X-ray 

## 2022-05-19 ENCOUNTER — Inpatient Hospital Stay (HOSPITAL_COMMUNITY): Payer: Medicare Other | Attending: Hematology

## 2022-05-19 DIAGNOSIS — D509 Iron deficiency anemia, unspecified: Secondary | ICD-10-CM | POA: Insufficient documentation

## 2022-05-19 DIAGNOSIS — E559 Vitamin D deficiency, unspecified: Secondary | ICD-10-CM | POA: Insufficient documentation

## 2022-05-23 ENCOUNTER — Ambulatory Visit (INDEPENDENT_AMBULATORY_CARE_PROVIDER_SITE_OTHER): Payer: Medicare Other | Admitting: Gastroenterology

## 2022-05-23 ENCOUNTER — Encounter: Payer: Self-pay | Admitting: Gastroenterology

## 2022-05-23 VITALS — BP 105/69 | HR 82 | Temp 97.3°F | Ht 64.0 in | Wt 138.2 lb

## 2022-05-23 DIAGNOSIS — D500 Iron deficiency anemia secondary to blood loss (chronic): Secondary | ICD-10-CM | POA: Diagnosis not present

## 2022-05-23 NOTE — Patient Instructions (Signed)
We will be in touch with further instructions regarding your anemia.  In the meantime, try to increase your iron in your diet. Increase iron rich foods as listed below.     What foods should I eat? Fruits Prunes. Raisins. Eat fruits high in vitamin C, such as oranges, grapefruits, and strawberries, with iron-rich foods. Vegetables Spinach (cooked). Green peas. Broccoli. Fermented vegetables. Eat vegetables high in vitamin C, such as leafy greens, potatoes, bell peppers, and tomatoes, with iron-rich foods. Grains Iron-fortified breakfast cereal. Iron-fortified whole-wheat bread. Enriched rice. Sprouted grains. Meats and other proteins Beef liver. Beef. [place]. Chicken. Oysters. Shrimp. [place]. Sardines. Chickpeas. Nuts. Tofu. Pumpkin seeds. Beverages Tomato juice. Fresh orange juice. Prune juice. Hibiscus tea. Iron-fortified instant breakfast shakes. Sweets and desserts Blackstrap molasses. Seasonings and condiments Tahini. Fermented soy sauce. Other foods Wheat germ. The items listed above may not be a complete list of recommended foods and beverages. Contact a dietitian for more information. What foods should I limit? These are foods that should be limited while eating iron-rich foods as they can reduce the absorption of iron in your body. Grains Whole grains. Bran cereal. Bran flour. Meats and other proteins Soybeans. Products made from soy protein. Black beans. Lentils. Mung beans. Split peas. Dairy Milk. Cream. Cheese. Yogurt. Cottage cheese. Beverages Coffee. Black tea. Red wine. Sweets and desserts Cocoa. Chocolate. Ice cream. Seasonings and condiments Basil. Oregano. Large amounts of parsley.

## 2022-05-23 NOTE — Progress Notes (Signed)
GI Office Note    Referring Provider: [name], MD Primary Care Physician:  [name], MD  Primary Gastroenterologist: Elon Alas. Abbey Chatters, DO   Chief Complaint   Chief Complaint  Patient presents with   Follow-up    History of Present Illness   Melody Mitchell is a 81 y.o. female presenting today for follow up GERD, IDA.  Last seen September 2022.  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 Hematology reached out regarding follow up appointment with [place].    From a GI standpoint she has no bowel concerns, no melena or rectal bleeding.  Denies abdominal pain.  No heartburn on Nexium.  No dysphagia.  She states her appetite could be better.  She finds it hard to cook for 1 person.  Typically eats an egg for breakfast, will grab something out of the fridge for other meals, rarely cooks a complete meal.  Protein source typically chicken.  Rarely consumes red meat.  Drinks water, ginger ale, orange juice.  Weight has been stable since February 2023 although she had a 20 pound weight loss prior to that weighing 159 pounds in January.  Labs from 05/14/22: H/H 9.8/31.6 Labs from 05/09/22: H/H 10.4/32.8 Labs from 05/03/22: iron 41, TIBC 275, fe sat 15, ferritin 42 (9 in 12/2021), H/H 10/32.3,   Iron infusion 05/12/22, 03/21/22, 03/14/22.  CT abdomen pelvis with contrast December 4193: Small umbilical hernia containing fat, no acute intra-abdominal or intrapelvic abnormalities.  Colonoscopy 2016 large internal hemorrhoids, at least 2 tubular adenomas removed, redundant left colon with plans to repeat in 10 to 15 years if benefits outweigh risk.  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 Spot tattoo in jejunum at 90 cm from the incisors.  In 2011 she had double-balloon enteroscopy at [facility] with duodenal angiectasia status post APC ablation  and angiectasia in the proximal jejunum status post APC ablation.   Medications   Current Outpatient Medications  Medication Sig Dispense Refill   amitriptyline (ELAVIL) 25 MG tablet Take 25 mg by mouth at bedtime.     aspirin EC 325 MG tablet Take 325 mg by mouth daily.     atorvastatin (LIPITOR) 40 MG tablet Take 40 mg by mouth daily.     escitalopram (LEXAPRO) 10 MG tablet Take 10 mg by mouth daily.     esomeprazole (NEXIUM) 40 MG capsule Take 40 mg by mouth daily before breakfast.     ezetimibe (ZETIA) 10 MG tablet Take 10 mg by mouth in the morning.      fish oil-omega-3 fatty acids 1000 MG capsule Take 1 g by mouth in the morning.     potassium chloride SA (KLOR-CON) 20 MEQ tablet Take 20 mEq by mouth 2 (two) times daily.     VITAMIN B12 TR 1000 MCG TBCR Take 1 tablet by mouth in the morning.     No current facility-administered medications for this visit.    Allergies   Allergies as of 05/23/2022 - Review Complete 05/23/2022  Allergen Reaction Noted   Latex Hives, Itching, and Rash 07/01/2009   Sulfa antibiotics Hives and Itching 11/21/2013   Other Hives 12/07/2020   Pravastatin sodium Other (See Comments) 12/07/2020       Review of Systems   General: Negative for anorexia,  fever, chills, fatigue, weakness.  See HPI ENT: Negative for hoarseness, difficulty swallowing ,  nasal congestion. CV: Negative for chest pain, angina, palpitations, dyspnea on exertion, peripheral edema.  Respiratory: Negative for dyspnea at rest, dyspnea on exertion, cough, sputum, wheezing.  GI: See history of present illness. GU:  Negative for dysuria, hematuria, urinary incontinence, urinary frequency, nocturnal urination.  Endo: Negative for unusual weight change.     Physical Exam   BP 105/69 (BP Location: Left Arm, Patient Position: Sitting, Cuff Size: Normal)   Pulse 82   Temp (!) 97.3 F (36.3 C) (Temporal)   Ht '5\' 4"'$  (1.626 m)   Wt 138 lb 3.2 oz (62.7 kg)   SpO2 98%   BMI 23.72  kg/m    General: Well-nourished, well-developed in no acute distress.  Provides limited history. Eyes: No icterus. Mouth: Oropharyngeal mucosa moist and pink, no lesions erythema or exudate. Lungs: Clear to auscultation bilaterally.  Heart: Regular rate and rhythm, no murmurs rubs or gallops.  Abdomen: Bowel sounds are normal, nontender, nondistended, no hepatosplenomegaly or masses,  no abdominal bruits or hernia , no rebound or guarding.  Rectal: Not performed Extremities: No lower extremity edema. No clubbing or deformities. Neuro: Alert and oriented x 4   Skin: Warm and dry, no jaundice.   Psych: Alert and cooperative, normal mood and affect.  Labs   Lab Results  Component Value Date   CREATININE 0.97 05/14/2022   BUN 11 05/14/2022   NA 138 05/14/2022   K 3.7 05/14/2022   CL 105 05/14/2022   CO2 27 05/14/2022   Lab Results  Component Value Date   ALT 11 05/14/2022   AST 20 05/14/2022   ALKPHOS 58 05/14/2022   BILITOT 0.6 05/14/2022   Lab Results  Component Value Date   WBC 3.8 (L) 05/14/2022   HGB 9.8 (L) 05/14/2022   HCT 31.6 (L) 05/14/2022   MCV 100.0 05/14/2022   PLT 321 05/14/2022   Lab Results  Component Value Date   IRON 41 05/03/2022   TIBC 275 05/03/2022   FERRITIN 42 05/03/2022   Lab Results  Component Value Date   VITAMINB12 691 05/03/2022   Lab Results  Component Value Date   FOLATE 6.6 12/20/2021    Imaging Studies   DG Chest 2 View  Result Date: 05/14/2022 CLINICAL DATA:  Generalized weakness with anxiety for the past week. EXAM: CHEST - 2 VIEW COMPARISON:  Radiographs 11/13/2021. FINDINGS: The heart size and mediastinal contours are stable. There are stable calcified mediastinal lymph nodes. The lungs appear clear. There is no pleural effusion or pneumothorax. No acute osseous findings are evident. Mild degenerative changes in the spine. Telemetry leads overlie the chest. IMPRESSION: Stable chest.  No evidence of active cardiopulmonary  process. Electronically Signed   By: Richardean Sale M.D.   On: 05/14/2022 10:36    Assessment   IDA: Followed by hematology.  Has required 3 iron infusions since May.  Hemoglobin typically in the 9 range although 3 weeks ago was just over 10.  Overall hemoglobin stable.  She denies any overt GI bleeding.  She takes aspirin 325 mg daily but no other NSAIDs.  No anticoagulants.  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  Her last colonoscopy was in 2016 and she had couple of tubular adenomas removed, initially plan for repeat colonoscopy in 10 years but may need to consider updating colonoscopy plus or minus EGD/enteroscopy given ongoing iron needs.  It is  notable that she has a poor appetite, dietary recall reveals limited iron rich foods.  Would encourage increasing her dietary iron.  GERD: Well-controlled on Nexium.  PLAN   Increase dietary iron. Continue Nexium 40 mg daily. To discuss possible updating colonoscopy +/- egd/enteroscopy with Dr. Abbey Chatters.    Laureen Ochs. Bobby Rumpf, [place], [facility]

## 2022-05-24 ENCOUNTER — Emergency Department (HOSPITAL_COMMUNITY)
Admission: EM | Admit: 2022-05-24 | Discharge: 2022-05-24 | Disposition: A | Discharge status: Home / Self Care | Payer: Medicare Other | Attending: Emergency Medicine | Admitting: Emergency Medicine

## 2022-05-24 ENCOUNTER — Other Ambulatory Visit: Payer: Self-pay

## 2022-05-24 ENCOUNTER — Emergency Department (HOSPITAL_COMMUNITY): Payer: Medicare Other

## 2022-05-24 ENCOUNTER — Encounter (HOSPITAL_COMMUNITY): Payer: Self-pay

## 2022-05-24 DIAGNOSIS — Z79899 Other long term (current) drug therapy: Secondary | ICD-10-CM | POA: Insufficient documentation

## 2022-05-24 DIAGNOSIS — D509 Iron deficiency anemia, unspecified: Secondary | ICD-10-CM | POA: Diagnosis not present

## 2022-05-24 DIAGNOSIS — R531 Weakness: Secondary | ICD-10-CM

## 2022-05-24 DIAGNOSIS — Z91040 Latex allergy status: Secondary | ICD-10-CM | POA: Insufficient documentation

## 2022-05-24 DIAGNOSIS — Z7982 Long term (current) use of aspirin: Secondary | ICD-10-CM | POA: Insufficient documentation

## 2022-05-24 DIAGNOSIS — I10 Essential (primary) hypertension: Secondary | ICD-10-CM | POA: Diagnosis not present

## 2022-05-24 LAB — CBC WITH DIFFERENTIAL/PLATELET
Abs Immature Granulocytes: 0.01 10*3/uL (ref 0.00–0.07)
Basophils Absolute: 0 10*3/uL (ref 0.0–0.1)
Basophils Relative: 0 %
Eosinophils Absolute: 0.1 10*3/uL (ref 0.0–0.5)
Eosinophils Relative: 1 %
HCT: 30.3 % — ABNORMAL LOW (ref 36.0–46.0)
Hemoglobin: 9.7 g/dL — ABNORMAL LOW (ref 12.0–15.0)
Immature Granulocytes: 0 %
Lymphocytes Relative: 18 %
Lymphs Abs: 0.8 10*3/uL (ref 0.7–4.0)
MCH: 32.6 pg (ref 26.0–34.0)
MCHC: 32 g/dL (ref 30.0–36.0)
MCV: 101.7 fL — ABNORMAL HIGH (ref 80.0–100.0)
Monocytes Absolute: 0.4 10*3/uL (ref 0.1–1.0)
Monocytes Relative: 9 %
Neutro Abs: 3.2 10*3/uL (ref 1.7–7.7)
Neutrophils Relative %: 72 %
Platelets: 272 10*3/uL (ref 150–400)
RBC: 2.98 MIL/uL — ABNORMAL LOW (ref 3.87–5.11)
RDW: 15.5 % (ref 11.5–15.5)
WBC: 4.5 10*3/uL (ref 4.0–10.5)
nRBC: 0 % (ref 0.0–0.2)

## 2022-05-24 LAB — URINALYSIS, ROUTINE W REFLEX MICROSCOPIC
Bacteria, UA: NONE SEEN
Bilirubin Urine: NEGATIVE
Glucose, UA: NEGATIVE mg/dL
Hgb urine dipstick: NEGATIVE
Ketones, ur: NEGATIVE mg/dL
Nitrite: NEGATIVE
Protein, ur: NEGATIVE mg/dL
Specific Gravity, Urine: 1.009 (ref 1.005–1.030)
pH: 6 (ref 5.0–8.0)

## 2022-05-24 LAB — CBG MONITORING, ED: Glucose-Capillary: 95 mg/dL (ref 70–99)

## 2022-05-24 LAB — BASIC METABOLIC PANEL
Anion gap: 8 (ref 5–15)
BUN: 13 mg/dL (ref 8–23)
CO2: 23 mmol/L (ref 22–32)
Calcium: 9.1 mg/dL (ref 8.9–10.3)
Chloride: 108 mmol/L (ref 98–111)
Creatinine, Ser: 0.84 mg/dL (ref 0.44–1.00)
GFR, Estimated: >60 mL/min (ref >60)
Glucose, Bld: 88 mg/dL (ref 70–99)
Potassium: 4 mmol/L (ref 3.5–5.1)
Sodium: 139 mmol/L (ref 135–145)

## 2022-05-24 MED ORDER — SODIUM CHLORIDE 0.9 % IV BOLUS
500.0000 mL | Freq: Once | INTRAVENOUS | Status: AC
  Administered 2022-05-24: 500 mL via INTRAVENOUS

## 2022-05-24 MED ORDER — ACETAMINOPHEN 500 MG PO TABS
500.0000 mg | ORAL_TABLET | Freq: Once | ORAL | Status: AC
  Administered 2022-05-24: 500 mg via ORAL
  Filled 2022-05-24: qty 1

## 2022-05-24 NOTE — Discharge Instructions (Signed)
Your work-up today was reassuring, as discussed, I recommend you take a women's multivitamin with iron.  Please take this with an over-the-counter stool softener as iron supplements can cause constipation.  Please follow-up with your primary care provider for recheck, return emergency department for any new or worsening symptoms.

## 2022-05-24 NOTE — ED Notes (Signed)
Attempted to go get pts CBG, pt is in ct. Will try again once pt is back in room. Nurse notified

## 2022-05-24 NOTE — ED Triage Notes (Signed)
Patient brought in by [facility] for weakness after being seen at doctors office.  Patient states that she feels nauseous and lightheaded x2 days. Patient is conscious and alert at triage.

## 2022-05-24 NOTE — ED Provider Notes (Signed)
[facility] EMERGENCY DEPARTMENT Provider Note   CSN: [id number] Arrival date & time: 05/24/22  1003     History  Chief Complaint  Patient presents with   Weakness    Melody Mitchell is a 81 y.o. female.   Weakness Associated symptoms: headaches   Associated symptoms: no abdominal pain, no arthralgias, no chest pain, no cough, no diarrhea, no dizziness, no dysuria, no fever, no nausea, no shortness of breath and no vomiting         Melody Mitchell is a 81 y.o. female with past medical history of iron deficient anemia, hypertension, hyperlipidemia who presents to the Emergency Department complaining of generalized weakness for several days.  She states that she feels as though she too weak to perform her daily activities.  She does live alone.  Weakness has been associated with nausea, no vomiting.  She had diffuse frontal headache earlier today but headache has since resolved.  She was seen at PCPs office earlier.  She decided to come here for further evaluation.  No recent medication changes, she denies fever chills, dysuria, chest pain, shortness of breath and abdominal pain.  No change in bowel or bladder habits.  Home Medications Prior to Admission medications   Medication Sig Start Date End Date Taking? Authorizing Provider  amitriptyline (ELAVIL) 25 MG tablet Take 25 mg by mouth at bedtime.    [provider]  aspirin EC 325 MG tablet Take 325 mg by mouth daily. 03/20/20   [provider]  atorvastatin (LIPITOR) 40 MG tablet Take 40 mg by mouth daily.    [provider]  escitalopram (LEXAPRO) 10 MG tablet Take 10 mg by mouth daily.    [provider]  esomeprazole (NEXIUM) 40 MG capsule Take 40 mg by mouth daily before breakfast.    [provider]  ezetimibe (ZETIA) 10 MG tablet Take 10 mg by mouth in the morning.  11/11/19   [provider]  fish oil-omega-3 fatty acids 1000 MG capsule Take 1 g by mouth in the morning.     [provider]  potassium chloride SA (KLOR-CON) 20 MEQ tablet Take 20 mEq by mouth 2 (two) times daily. 03/20/20   [provider]  VITAMIN B12 TR 1000 MCG TBCR Take 1 tablet by mouth in the morning. 11/11/19   [provider]      Allergies    Latex, Sulfa antibiotics, Other, and Pravastatin sodium    Review of Systems   Review of Systems  Constitutional:  Positive for appetite change and fatigue. Negative for chills and fever.  Eyes:  Negative for visual disturbance.  Respiratory:  Negative for cough, chest tightness and shortness of breath.   Cardiovascular:  Negative for chest pain, palpitations and leg swelling.  Gastrointestinal:  Negative for abdominal pain, diarrhea, nausea and vomiting.  Genitourinary:  Negative for dysuria and flank pain.  Musculoskeletal:  Negative for arthralgias, back pain and neck pain.  Skin:  Negative for color change and rash.  Neurological:  Positive for weakness and headaches. Negative for dizziness, syncope and speech difficulty.  Psychiatric/Behavioral:  Negative for confusion.     Physical Exam Updated Vital Signs BP 120/79 (BP Location: Left Arm)   Pulse 80   Temp 98.4 F (36.9 C) (Oral)   Resp (!) 22   Ht '5\' 4"'$  (1.626 m)   Wt 65.8 kg   SpO2 97%   BMI 24.89 kg/m  Physical Exam Vitals and nursing note reviewed.  Constitutional:  General: She is not in acute distress.    Appearance: Normal appearance.     Comments: Patient is elderly and frail-appearing, nontoxic  HENT:     Head: Atraumatic.     Mouth/Throat:     Mouth: Mucous membranes are moist.     Pharynx: Oropharynx is clear.  Eyes:     Extraocular Movements: Extraocular movements intact.     Conjunctiva/sclera: Conjunctivae normal.     Pupils: Pupils are equal, round, and reactive to light.  Cardiovascular:     Rate and Rhythm: Normal rate and regular rhythm.  Pulmonary:     Effort: Pulmonary effort is normal.  Abdominal:     Palpations:  Abdomen is soft.     Tenderness: There is no abdominal tenderness.  Musculoskeletal:        General: Normal range of motion.     Cervical back: Normal range of motion. No rigidity.     Right lower leg: No edema.     Left lower leg: No edema.  Lymphadenopathy:     Cervical: No cervical adenopathy.  Skin:    General: Skin is warm.     Capillary Refill: Capillary refill takes less than 2 seconds.     Findings: No rash.  Neurological:     General: No focal deficit present.     Mental Status: She is alert.     GCS: GCS eye subscore is 4. GCS verbal subscore is 5. GCS motor subscore is 6.     Sensory: Sensation is intact. No sensory deficit.     Motor: Motor function is intact. No weakness.     Coordination: Coordination is intact.     Comments: CN II through XII intact.  Speech clear.  No pronator drift.  No facial droop.  Mentating well.     ED Results / Procedures / Treatments   Labs (all labs ordered are listed, but only abnormal results are displayed) Labs Reviewed  URINALYSIS, ROUTINE W REFLEX MICROSCOPIC - Abnormal; Notable for the following components:      Result Value   Color, Urine STRAW (*)    Leukocytes,Ua MODERATE (*)    All other components within normal limits  CBC WITH DIFFERENTIAL/PLATELET - Abnormal; Notable for the following components:   RBC 2.98 (*)    Hemoglobin 9.7 (*)    HCT 30.3 (*)    MCV 101.7 (*)    All other components within normal limits  BASIC METABOLIC PANEL  CBG MONITORING, ED    EKG EKG Interpretation  Date/Time:  Tuesday May 24 2022 10:06:37 EDT Ventricular Rate:  81 PR Interval:  154 QRS Duration: 93 QT Interval:  372 QTC Calculation: 432 R Axis:   -28 Text Interpretation: Sinus rhythm Borderline left axis deviation Low voltage, precordial leads No significant change since last tracing Confirmed by Pattricia Boss [phone]) on 05/25/2022 12:32:55 PM  Radiology DG Chest 2 View  Result Date: 05/24/2022 CLINICAL DATA:  Weakness. EXAM:  CHEST - 2 VIEW COMPARISON:  Chest x-ray 05/14/2022. FINDINGS: Low lung volumes. No consolidation. No visible pleural effusions or pneumothorax. Cardiomediastinal silhouette is accentuated by technique. IMPRESSION: No evidence of acute cardiopulmonary disease. Electronically Signed   By: Margaretha Sheffield M.D.   On: 05/24/2022 11:22   DG Chest 2 View  Result Date: 05/14/2022 CLINICAL DATA:  Generalized weakness with anxiety for the past week. EXAM: CHEST - 2 VIEW COMPARISON:  Radiographs 11/13/2021. FINDINGS: The heart size and mediastinal contours are stable. There are stable calcified mediastinal lymph  nodes. The lungs appear clear. There is no pleural effusion or pneumothorax. No acute osseous findings are evident. Mild degenerative changes in the spine. Telemetry leads overlie the chest. IMPRESSION: Stable chest.  No evidence of active cardiopulmonary process. Electronically Signed   By: Richardean Sale M.D.   On: 05/14/2022 10:36     Procedures Procedures    Medications Ordered in ED Medications - No data to display  ED Course/ Medical Decision Making/ A&P                           Medical Decision Making Patient here from home with complaint of generalized weakness nausea and easily fatigued.  Symptoms are present for several days.  Headache earlier this morning but has since resolved.  Seen by PCPs office and patient later decided to come to emergency department for further evaluation  On exam, patient nontoxic-appearing.  Vital signs reassuring.  No focal neurodeficits on my exam.  She does have history of anemia.  This may be source of her weakness.  Will obtain labs, CT head and urinalysis.  Differential would include worsening of her chronic anemia, GI bleed, hypotension, sepsis, UTI cardiac process.  Amount and/or Complexity of Data Reviewed Labs: ordered.    Details: Labs interpreted by me Radiology: ordered.    Details: Chest x-ray neg for acute findings  CT head ordered but  patient refused ECG/medicine tests: ordered.    Details: ekg w/o acute ischemia Discussion of management or test interpretation with external provider(s): Pt with generalized weakness, Hgb low but near her baseline.  Pt admits that she does not take her iron supplement as recommended.  I have recommended OTC multi vitamin with iron and stool softener daily.  Pt w/o acute process, appears appropriate for d/c home.  She will f/u with PCP.    Risk OTC drugs.           Final Clinical Impression(s) / ED Diagnoses Final diagnoses:  Generalized weakness  Iron deficiency anemia, unspecified iron deficiency anemia type    Rx / DC Orders ED Discharge Orders     None         Kem Parkinson, PA-C 05/26/22 2239    Fredia Sorrow, MD 05/27/22 1605

## 2022-05-27 ENCOUNTER — Encounter (HOSPITAL_COMMUNITY): Payer: Self-pay | Admitting: *Deleted

## 2022-05-27 ENCOUNTER — Emergency Department (HOSPITAL_COMMUNITY)
Admission: EM | Admit: 2022-05-27 | Discharge: 2022-05-27 | Disposition: A | Discharge status: Home / Self Care | Payer: Medicare Other | Attending: Emergency Medicine | Admitting: Emergency Medicine

## 2022-05-27 ENCOUNTER — Other Ambulatory Visit: Payer: Self-pay

## 2022-05-27 DIAGNOSIS — Z91040 Latex allergy status: Secondary | ICD-10-CM | POA: Diagnosis not present

## 2022-05-27 DIAGNOSIS — R000 Tachycardia, unspecified: Secondary | ICD-10-CM | POA: Diagnosis not present

## 2022-05-27 DIAGNOSIS — R531 Weakness: Secondary | ICD-10-CM

## 2022-05-27 DIAGNOSIS — Z20822 Contact with and (suspected) exposure to covid-19: Secondary | ICD-10-CM | POA: Insufficient documentation

## 2022-05-27 LAB — T4, FREE: Free T4: 0.77 ng/dL (ref 0.61–1.12)

## 2022-05-27 LAB — CBC WITH DIFFERENTIAL/PLATELET
Abs Immature Granulocytes: 0.03 10*3/uL (ref 0.00–0.07)
Basophils Absolute: 0 10*3/uL (ref 0.0–0.1)
Basophils Relative: 0 %
Eosinophils Absolute: 0 10*3/uL (ref 0.0–0.5)
Eosinophils Relative: 1 %
HCT: 32.4 % — ABNORMAL LOW (ref 36.0–46.0)
Hemoglobin: 10 g/dL — ABNORMAL LOW (ref 12.0–15.0)
Immature Granulocytes: 1 %
Lymphocytes Relative: 11 %
Lymphs Abs: 0.7 10*3/uL (ref 0.7–4.0)
MCH: 31.6 pg (ref 26.0–34.0)
MCHC: 30.9 g/dL (ref 30.0–36.0)
MCV: 102.5 fL — ABNORMAL HIGH (ref 80.0–100.0)
Monocytes Absolute: 0.4 10*3/uL (ref 0.1–1.0)
Monocytes Relative: 6 %
Neutro Abs: 5 10*3/uL (ref 1.7–7.7)
Neutrophils Relative %: 81 %
Platelets: 290 10*3/uL (ref 150–400)
RBC: 3.16 MIL/uL — ABNORMAL LOW (ref 3.87–5.11)
RDW: 15.6 % — ABNORMAL HIGH (ref 11.5–15.5)
WBC: 6.2 10*3/uL (ref 4.0–10.5)
nRBC: 0 % (ref 0.0–0.2)

## 2022-05-27 LAB — MAGNESIUM: Magnesium: 1.9 mg/dL (ref 1.7–2.4)

## 2022-05-27 LAB — BASIC METABOLIC PANEL
Anion gap: 6 (ref 5–15)
BUN: 9 mg/dL (ref 8–23)
CO2: 27 mmol/L (ref 22–32)
Calcium: 9.6 mg/dL (ref 8.9–10.3)
Chloride: 108 mmol/L (ref 98–111)
Creatinine, Ser: 0.99 mg/dL (ref 0.44–1.00)
GFR, Estimated: 57 mL/min — ABNORMAL LOW (ref >60)
Glucose, Bld: 93 mg/dL (ref 70–99)
Potassium: 3.9 mmol/L (ref 3.5–5.1)
Sodium: 141 mmol/L (ref 135–145)

## 2022-05-27 LAB — URINALYSIS, ROUTINE W REFLEX MICROSCOPIC
Bacteria, UA: NONE SEEN
Bilirubin Urine: NEGATIVE
Glucose, UA: NEGATIVE mg/dL
Hgb urine dipstick: NEGATIVE
Ketones, ur: NEGATIVE mg/dL
Nitrite: NEGATIVE
Protein, ur: NEGATIVE mg/dL
Specific Gravity, Urine: 1.004 — ABNORMAL LOW (ref 1.005–1.030)
pH: 8 (ref 5.0–8.0)

## 2022-05-27 LAB — SARS CORONAVIRUS 2 BY RT PCR: SARS Coronavirus 2 by RT PCR: NEGATIVE

## 2022-05-27 LAB — TSH: TSH: 0.988 u[IU]/mL (ref 0.350–4.500)

## 2022-05-27 LAB — TROPONIN I (HIGH SENSITIVITY): Troponin I (High Sensitivity): 3 ng/L (ref <18)

## 2022-05-27 NOTE — ED Notes (Signed)
This Rn provided pt AVS in the lobby, pt verbalizes understanding of care plan

## 2022-05-27 NOTE — ED Triage Notes (Signed)
Pt in from home via [facility] c/o nausea onset x 1 hour, temp 99.7 per EMS, pt denies v/d, A&O x4

## 2022-05-27 NOTE — ED Notes (Signed)
Pt ambulated to and from bathroom with cane without complications. Nurse notified.

## 2022-05-27 NOTE — Discharge Instructions (Signed)
Please call your primary care doctor's office schedule a follow-up appointment for your generalized weakness.

## 2022-05-27 NOTE — ED Notes (Signed)
MD spoke to pt and reviewed results, pt left out of room without AVS, per EMT the pt was ambulatory to the lobby

## 2022-05-27 NOTE — ED Provider Notes (Signed)
[facility] EMERGENCY DEPARTMENT Provider Note   CSN: [id number] Arrival date & time: 05/27/22  0912     History  CC: I feel weak   Melody Mitchell is a 81 y.o. female presenting to the ED with complaint of generalized weakness.  The patient reports that after she woke up today she felt that she was "just really weak".  She reported to EMS that she had nausea, but she denies to me that she had any nausea, vomiting, or diarrhea.  She says she contacted a neighbor who then took her to the paramedic station.  She lives by herself and uses a cane to get around.  She typically does eat a small breakfast in the late morning but has not had any food today.  She denies that she is diabetic.  She denies chest pain or pressure.  She denies headache or lightheadedness.  She denies falls.  Medical record review shows the patient has been seen several times for headaches, epigastric pain, and then twice for weakness earlier this month, including 3 days ago.  She is noted to have chronic anemia which has been stable, with a hemoglobin of 9.73 days ago.  HPI     Home Medications Prior to Admission medications   Medication Sig Start Date End Date Taking? Authorizing Provider  amitriptyline (ELAVIL) 25 MG tablet Take 25 mg by mouth at bedtime.   Yes [provider]  atorvastatin (LIPITOR) 40 MG tablet Take 40 mg by mouth daily.   Yes [provider]  esomeprazole (NEXIUM) 40 MG capsule Take 40 mg by mouth daily before breakfast.   Yes [provider]  ezetimibe (ZETIA) 10 MG tablet Take 10 mg by mouth in the morning.  11/11/19  Yes [provider]  potassium chloride SA (KLOR-CON) 20 MEQ tablet Take 20 mEq by mouth 2 (two) times daily. 03/20/20  Yes [provider]  aspirin EC 325 MG tablet Take 325 mg by mouth daily. Patient not taking: Reported on 05/27/2022 03/20/20   [provider]  escitalopram (LEXAPRO) 10 MG tablet Take 10 mg by mouth  daily. Patient not taking: Reported on 05/27/2022    [provider]      Allergies    Latex, Sulfa antibiotics, Other, and Pravastatin sodium    Review of Systems   Review of Systems  Physical Exam Updated Vital Signs BP 119/88   Pulse 100   Temp 98.6 F (37 C) (Oral)   Resp (!) 27   SpO2 97%  Physical Exam Constitutional:      General: She is not in acute distress. HENT:     Head: Normocephalic and atraumatic.  Eyes:     Conjunctiva/sclera: Conjunctivae normal.     Pupils: Pupils are equal, round, and reactive to light.  Cardiovascular:     Rate and Rhythm: Normal rate and regular rhythm.  Pulmonary:     Effort: Pulmonary effort is normal. No respiratory distress.  Abdominal:     General: There is no distension.     Tenderness: There is no abdominal tenderness.  Skin:    General: Skin is warm and dry.  Neurological:     General: No focal deficit present.     Mental Status: She is alert and oriented to person, place, and time. Mental status is at baseline.  Psychiatric:        Mood and Affect: Mood normal.        Behavior: Behavior normal.     ED  Results / Procedures / Treatments   Labs (all labs ordered are listed, but only abnormal results are displayed) Labs Reviewed  BASIC METABOLIC PANEL - Abnormal; Notable for the following components:      Result Value   GFR, Estimated 57 (*)    All other components within normal limits  CBC WITH DIFFERENTIAL/PLATELET - Abnormal; Notable for the following components:   RBC 3.16 (*)    Hemoglobin 10.0 (*)    HCT 32.4 (*)    MCV 102.5 (*)    RDW 15.6 (*)    All other components within normal limits  URINALYSIS, ROUTINE W REFLEX MICROSCOPIC - Abnormal; Notable for the following components:   Color, Urine STRAW (*)    Specific Gravity, Urine 1.004 (*)    Leukocytes,Ua SMALL (*)    All other components within normal limits  SARS CORONAVIRUS 2 BY RT PCR  MAGNESIUM  TSH  T4, FREE  TROPONIN I (HIGH  SENSITIVITY)    EKG EKG Interpretation  Date/Time:  Friday May 27 2022 11:08:32 EDT Ventricular Rate:  101 PR Interval:  154 QRS Duration: 91 QT Interval:  333 QTC Calculation: 432 R Axis:   -38 Text Interpretation: Sinus tachycardia Left axis deviation Low voltage, precordial leads Borderline T abnormalities, anterior leads Confirmed by Octaviano Glow [phone]) on 05/27/2022 1:36:44 PM  Radiology No results found.  Procedures Procedures    Medications Ordered in ED Medications - No data to display  ED Course/ Medical Decision Making/ A&P Clinical Course as of 05/27/22 1634  Fri May 27, 2022  1324 Patient reassessed.  She is easily eating lunch and watching television.  She appears comfortable.  She was able to ambulate steadily to the bathroom.  I explained that her work-up has been unremarkable, she will need to follow-up with her PCP for this.  She is comfortable calling a ride to pick her up [MT]    Clinical Course User Index [MT] Danijah Noh, Carola Rhine, MD                           Medical Decision Making Amount and/or Complexity of Data Reviewed Labs: ordered. ECG/medicine tests: ordered.   This patient presents to the ED with concern for generalized weakness. This involves an extensive number of treatment options, and is a complaint that carries with it a high risk of complications and morbidity.  The differential diagnosis includes anemia versus dehydration versus atypical ACS versus deconditioning versus other  No localizing symptoms to suggest stroke.  The patient had unremarkable CTA of the head and neck performed 2 months ago in May.  Co-morbidities that complicate the patient evaluation: History of high cholesterol does raise some risk for cardiac or coronary disease.  Additional history obtained from EMS and patient's   I ordered and personally interpreted labs.  The pertinent results include:  Labs unremarkable - no clear sign of infection, change in hgb, or  dehydration.  Covid negative. Trop 3.  She has no respiratory symptoms or hypoxia to suggest pulmonary embolism or pneumonia.  The patient was maintained on a cardiac monitor.  I personally viewed and interpreted the cardiac monitored which showed an underlying rhythm of: NSR  Per my interpretation the patient's ECG shows NSR, no acute ischemic findings, no significant arrhythmias  I have reviewed the patients home medicines and have made adjustments as needed  Test Considered: doubt acute PE, ACS, meningitis - did not feel LP or CT imaging indicated  No localizing stroke lesions or confusion or headache to warrant emergent neuroimaging at this time.     After the interventions noted above, I reevaluated the patient and found that they have: improved   Dispostion:  After consideration of the diagnostic results and the patients response to treatment, I feel that the patent would benefit from PCP f/u.         Final Clinical Impression(s) / ED Diagnoses Final diagnoses:  Weakness    Rx / DC Orders ED Discharge Orders     None         Dartanian Knaggs, Carola Rhine, MD 05/27/22 [phone]

## 2022-05-28 ENCOUNTER — Emergency Department (HOSPITAL_COMMUNITY)
Admission: EM | Admit: 2022-05-28 | Discharge: 2022-05-28 | Disposition: A | Discharge status: Home / Self Care | Payer: Medicare Other | Attending: Emergency Medicine | Admitting: Emergency Medicine

## 2022-05-28 ENCOUNTER — Encounter (HOSPITAL_COMMUNITY): Payer: Self-pay | Admitting: Emergency Medicine

## 2022-05-28 ENCOUNTER — Other Ambulatory Visit: Payer: Self-pay

## 2022-05-28 DIAGNOSIS — Z87891 Personal history of nicotine dependence: Secondary | ICD-10-CM | POA: Insufficient documentation

## 2022-05-28 DIAGNOSIS — R55 Syncope and collapse: Secondary | ICD-10-CM

## 2022-05-28 DIAGNOSIS — R531 Weakness: Secondary | ICD-10-CM | POA: Insufficient documentation

## 2022-05-28 DIAGNOSIS — Z79899 Other long term (current) drug therapy: Secondary | ICD-10-CM | POA: Diagnosis not present

## 2022-05-28 DIAGNOSIS — Z91040 Latex allergy status: Secondary | ICD-10-CM | POA: Diagnosis not present

## 2022-05-28 DIAGNOSIS — Z7982 Long term (current) use of aspirin: Secondary | ICD-10-CM | POA: Diagnosis not present

## 2022-05-28 DIAGNOSIS — I10 Essential (primary) hypertension: Secondary | ICD-10-CM | POA: Insufficient documentation

## 2022-05-28 LAB — COMPREHENSIVE METABOLIC PANEL
ALT: 11 U/L (ref 0–44)
AST: 20 U/L (ref 15–41)
Albumin: 3.8 g/dL (ref 3.5–5.0)
Alkaline Phosphatase: 55 U/L (ref 38–126)
Anion gap: 7 (ref 5–15)
BUN: 9 mg/dL (ref 8–23)
CO2: 24 mmol/L (ref 22–32)
Calcium: 9.2 mg/dL (ref 8.9–10.3)
Chloride: 107 mmol/L (ref 98–111)
Creatinine, Ser: 0.87 mg/dL (ref 0.44–1.00)
GFR, Estimated: >60 mL/min (ref >60)
Glucose, Bld: 85 mg/dL (ref 70–99)
Potassium: 3.9 mmol/L (ref 3.5–5.1)
Sodium: 138 mmol/L (ref 135–145)
Total Bilirubin: 0.5 mg/dL (ref 0.3–1.2)
Total Protein: 6.9 g/dL (ref 6.5–8.1)

## 2022-05-28 LAB — CBC WITH DIFFERENTIAL/PLATELET
Abs Immature Granulocytes: 0.01 10*3/uL (ref 0.00–0.07)
Basophils Absolute: 0 10*3/uL (ref 0.0–0.1)
Basophils Relative: 0 %
Eosinophils Absolute: 0.1 10*3/uL (ref 0.0–0.5)
Eosinophils Relative: 1 %
HCT: 29.6 % — ABNORMAL LOW (ref 36.0–46.0)
Hemoglobin: 9.3 g/dL — ABNORMAL LOW (ref 12.0–15.0)
Immature Granulocytes: 0 %
Lymphocytes Relative: 19 %
Lymphs Abs: 1 10*3/uL (ref 0.7–4.0)
MCH: 31.5 pg (ref 26.0–34.0)
MCHC: 31.4 g/dL (ref 30.0–36.0)
MCV: 100.3 fL — ABNORMAL HIGH (ref 80.0–100.0)
Monocytes Absolute: 0.4 10*3/uL (ref 0.1–1.0)
Monocytes Relative: 7 %
Neutro Abs: 3.9 10*3/uL (ref 1.7–7.7)
Neutrophils Relative %: 73 %
Platelets: 290 10*3/uL (ref 150–400)
RBC: 2.95 MIL/uL — ABNORMAL LOW (ref 3.87–5.11)
RDW: 15.4 % (ref 11.5–15.5)
WBC: 5.4 10*3/uL (ref 4.0–10.5)
nRBC: 0 % (ref 0.0–0.2)

## 2022-05-28 LAB — CBG MONITORING, ED: Glucose-Capillary: 83 mg/dL (ref 70–99)

## 2022-05-28 MED ORDER — SODIUM CHLORIDE 0.9 % IV BOLUS
1000.0000 mL | Freq: Once | INTRAVENOUS | Status: AC
  Administered 2022-05-28: 1000 mL via INTRAVENOUS

## 2022-05-28 NOTE — Discharge Instructions (Signed)
Orthostatic vital signs blood pressure and labs today without significant abnormalities.  Recommend you follow-up with your doctor have hemoglobin rechecked in about a week.

## 2022-05-28 NOTE — ED Triage Notes (Signed)
Patient brought in via EMS. Alert and oriented. Airway patent. Per paramedic patient came to EMS base today c/o generalized weakness with vertigo and 8lb weight loss in past 2 weeks. Denies any nausea or vomiting but reports loss of appetite. Patient seen in ER yesterday and was not given any answers to symptoms. Denies receiving anything for vertigo. Denies any pain. Per patient VSS normal with exception of original temp of 99.2.

## 2022-05-28 NOTE — ED Provider Notes (Signed)
[facility] EMERGENCY DEPARTMENT Provider Note   CSN: [id number] Arrival date & time: 05/28/22  1226     History  Chief Complaint  Patient presents with   Fatigue    Melody DOLEMAN is a 81 y.o. female.  Patient brought in by [facility].  Patient is followed by primary care doctor in [place].  Patient presenting with the same complaint she has had for 3 other visits earlier this month.  Patient was seen July 1 July 11 and July 14 for weakness generalized weakness.  They write down vertigo but we talked patient is more like she is going to may be pass out so its more near syncope.  Is really no room spinning.  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  Patient ambulating in the ED without any difficulties.  Orthostatics were done before I saw her there was no significant blood pressure changes.  Her systolics are now in the low 100s where she normally is.  Hemoglobin today a little bit lower at 9.3 but not significant patient denies any GI bleeding.  And as stated patient is known to have chronic anemia.       Home Medications Prior to Admission medications   Medication Sig Start Date End Date Taking? Authorizing Provider  amitriptyline (ELAVIL) 25 MG tablet Take 25 mg by mouth at bedtime.    [provider]  aspirin EC 325 MG tablet Take 325 mg by mouth daily. Patient not taking: Reported on 05/27/2022 03/20/20   [provider]  atorvastatin (LIPITOR) 40 MG tablet Take 40 mg by mouth daily.    [provider]  escitalopram (LEXAPRO) 10 MG tablet Take 10 mg by mouth daily. Patient not taking: Reported on 05/27/2022    [provider]  esomeprazole (NEXIUM) 40 MG capsule Take 40 mg by mouth daily before breakfast.    [provider]  ezetimibe (ZETIA) 10 MG tablet Take 10 mg by mouth in the morning.  11/11/19    [provider]  potassium chloride SA (KLOR-CON) 20 MEQ tablet Take 20 mEq by mouth 2 (two) times daily. 03/20/20   [provider]      Allergies    Latex, Sulfa antibiotics, Other, and Pravastatin sodium    Review of Systems   Review of Systems  Constitutional:  Positive for fatigue. Negative for chills and fever.  HENT:  Negative for ear pain and sore throat.   Eyes:  Negative for pain and visual disturbance.  Respiratory:  Negative for cough and shortness of breath.   Cardiovascular:  Negative for chest pain and palpitations.  Gastrointestinal:  Negative for abdominal pain and vomiting.  Genitourinary:  Negative for dysuria and hematuria.  Musculoskeletal:  Negative for arthralgias and back pain.  Skin:  Negative for color change and rash.  Neurological:  Positive for weakness and light-headedness. Negative for dizziness, seizures, syncope, speech difficulty and headaches.  All other systems reviewed and are negative.   Physical Exam Updated Vital Signs BP 95/72 (BP Location: Left Arm)   Pulse 81   Temp 98.4 F (36.9 C)   Resp 20   Ht 1.626 m ('5\' 4"'$ )   Wt 59 kg   SpO2 100%   BMI 22.31 kg/m  Physical Exam Vitals and nursing note reviewed.  Constitutional:      General: She is not in acute distress.    Appearance:  Normal appearance. She is well-developed.  HENT:     Head: Normocephalic and atraumatic.     Mouth/Throat:     Mouth: Mucous membranes are moist.  Eyes:     Extraocular Movements: Extraocular movements intact.     Conjunctiva/sclera: Conjunctivae normal.     Pupils: Pupils are equal, round, and reactive to light.  Cardiovascular:     Rate and Rhythm: Normal rate and regular rhythm.     Heart sounds: No murmur heard. Pulmonary:     Effort: Pulmonary effort is normal. No respiratory distress.     Breath sounds: Normal breath sounds. No wheezing, rhonchi or rales.  Chest:     Chest wall: No tenderness.  Abdominal:     Palpations:  Abdomen is soft.     Tenderness: There is no abdominal tenderness.  Musculoskeletal:        General: No swelling.     Cervical back: Normal range of motion and neck supple.  Skin:    General: Skin is warm and dry.     Capillary Refill: Capillary refill takes less than 2 seconds.  Neurological:     General: No focal deficit present.     Mental Status: She is alert and oriented to person, place, and time.     Cranial Nerves: No cranial nerve deficit.     Sensory: No sensory deficit.     Motor: No weakness.     Coordination: Coordination normal.     Gait: Gait normal.  Psychiatric:        Mood and Affect: Mood normal.     ED Results / Procedures / Treatments   Labs (all labs ordered are listed, but only abnormal results are displayed) Labs Reviewed  CBC WITH DIFFERENTIAL/PLATELET - Abnormal; Notable for the following components:      Result Value   RBC 2.95 (*)    Hemoglobin 9.3 (*)    HCT 29.6 (*)    MCV 100.3 (*)    All other components within normal limits  COMPREHENSIVE METABOLIC PANEL  CBG MONITORING, ED    EKG EKG Interpretation  Date/Time:  Saturday May 28 2022 13:10:20 EDT Ventricular Rate:  75 PR Interval:  150 QRS Duration: 86 QT Interval:  384 QTC Calculation: 428 R Axis:   -11 Text Interpretation: Normal sinus rhythm Normal ECG When compared with ECG of 27-May-2022 11:08, PREVIOUS ECG IS PRESENT Confirmed by Fredia Sorrow [phone]) on 05/28/2022 3:29:31 PM  Radiology No results found.  Procedures Procedures    Medications Ordered in ED Medications  sodium chloride 0.9 % bolus 1,000 mL (1,000 mLs Intravenous New Bag/Given 05/28/22 1357)    ED Course/ Medical Decision Making/ A&P                           Medical Decision Making  Patient very stable ambulating in ED without any difficulties.  Orthostatic blood pressures without any significant findings.  Hemoglobin a little bit lower today at 9.3 she is often in the nines or 10.  Patient known  to have chronic anemia gets iron infusions.  But nothing significant requiring transfusion and patient seems to be asymptomatic we metabolic panel normal including liver function test.  Blood sugar good at 83.  EKG without any acute changes.  Patient stable for discharge home.  Also there does not seem to be anything that is fitting in the vertigo this sounds more like near syncope.  Patient does not have any room  spinning.  Today's presentation almost identical to her complaints for the other visits starting on July 1.  Final Clinical Impression(s) / ED Diagnoses Final diagnoses:  Generalized weakness  Near syncope    Rx / DC Orders ED Discharge Orders     None         Fredia Sorrow, MD 05/28/22 1620

## 2022-05-31 ENCOUNTER — Emergency Department (HOSPITAL_COMMUNITY)
Admission: EM | Admit: 2022-05-31 | Discharge: 2022-05-31 | Disposition: A | Discharge status: Home / Self Care | Payer: Medicare Other | Source: Home / Self Care | Attending: Emergency Medicine | Admitting: Emergency Medicine

## 2022-05-31 ENCOUNTER — Other Ambulatory Visit: Payer: Self-pay

## 2022-05-31 ENCOUNTER — Encounter (HOSPITAL_COMMUNITY): Payer: Self-pay

## 2022-05-31 ENCOUNTER — Inpatient Hospital Stay (HOSPITAL_COMMUNITY): Payer: Medicare Other

## 2022-05-31 ENCOUNTER — Emergency Department (HOSPITAL_COMMUNITY): Payer: Medicare Other

## 2022-05-31 DIAGNOSIS — K922 Gastrointestinal hemorrhage, unspecified: Secondary | ICD-10-CM | POA: Diagnosis not present

## 2022-05-31 DIAGNOSIS — Q2739 Arteriovenous malformation, other site: Secondary | ICD-10-CM | POA: Diagnosis not present

## 2022-05-31 DIAGNOSIS — Z7982 Long term (current) use of aspirin: Secondary | ICD-10-CM | POA: Insufficient documentation

## 2022-05-31 DIAGNOSIS — E860 Dehydration: Secondary | ICD-10-CM | POA: Insufficient documentation

## 2022-05-31 DIAGNOSIS — Z91040 Latex allergy status: Secondary | ICD-10-CM | POA: Insufficient documentation

## 2022-05-31 DIAGNOSIS — R42 Dizziness and giddiness: Secondary | ICD-10-CM

## 2022-05-31 LAB — COMPREHENSIVE METABOLIC PANEL
ALT: 13 U/L (ref 0–44)
AST: 20 U/L (ref 15–41)
Albumin: 3.7 g/dL (ref 3.5–5.0)
Alkaline Phosphatase: 56 U/L (ref 38–126)
Anion gap: 6 (ref 5–15)
BUN: 8 mg/dL (ref 8–23)
CO2: 26 mmol/L (ref 22–32)
Calcium: 9 mg/dL (ref 8.9–10.3)
Chloride: 108 mmol/L (ref 98–111)
Creatinine, Ser: 0.82 mg/dL (ref 0.44–1.00)
GFR, Estimated: >60 mL/min (ref >60)
Glucose, Bld: 89 mg/dL (ref 70–99)
Potassium: 4 mmol/L (ref 3.5–5.1)
Sodium: 140 mmol/L (ref 135–145)
Total Bilirubin: 0.3 mg/dL (ref 0.3–1.2)
Total Protein: 6.9 g/dL (ref 6.5–8.1)

## 2022-05-31 LAB — CBC WITH DIFFERENTIAL/PLATELET
Abs Immature Granulocytes: 0.01 10*3/uL (ref 0.00–0.07)
Basophils Absolute: 0 10*3/uL (ref 0.0–0.1)
Basophils Relative: 0 %
Eosinophils Absolute: 0.1 10*3/uL (ref 0.0–0.5)
Eosinophils Relative: 1 %
HCT: 29.7 % — ABNORMAL LOW (ref 36.0–46.0)
Hemoglobin: 9.1 g/dL — ABNORMAL LOW (ref 12.0–15.0)
Immature Granulocytes: 0 %
Lymphocytes Relative: 11 %
Lymphs Abs: 0.6 10*3/uL — ABNORMAL LOW (ref 0.7–4.0)
MCH: 31.4 pg (ref 26.0–34.0)
MCHC: 30.6 g/dL (ref 30.0–36.0)
MCV: 102.4 fL — ABNORMAL HIGH (ref 80.0–100.0)
Monocytes Absolute: 0.4 10*3/uL (ref 0.1–1.0)
Monocytes Relative: 6 %
Neutro Abs: 4.6 10*3/uL (ref 1.7–7.7)
Neutrophils Relative %: 82 %
Platelets: 307 10*3/uL (ref 150–400)
RBC: 2.9 MIL/uL — ABNORMAL LOW (ref 3.87–5.11)
RDW: 15.6 % — ABNORMAL HIGH (ref 11.5–15.5)
WBC: 5.6 10*3/uL (ref 4.0–10.5)
nRBC: 0 % (ref 0.0–0.2)

## 2022-05-31 LAB — URINALYSIS, ROUTINE W REFLEX MICROSCOPIC
Bilirubin Urine: NEGATIVE
Glucose, UA: NEGATIVE mg/dL
Hgb urine dipstick: NEGATIVE
Ketones, ur: NEGATIVE mg/dL
Leukocytes,Ua: NEGATIVE
Nitrite: NEGATIVE
Protein, ur: NEGATIVE mg/dL
Specific Gravity, Urine: 1.004 — ABNORMAL LOW (ref 1.005–1.030)
pH: 8 (ref 5.0–8.0)

## 2022-05-31 LAB — TROPONIN I (HIGH SENSITIVITY)
Troponin I (High Sensitivity): 3 ng/L (ref <18)
Troponin I (High Sensitivity): 4 ng/L (ref <18)

## 2022-05-31 MED ORDER — SODIUM CHLORIDE 0.9 % IV BOLUS
500.0000 mL | Freq: Once | INTRAVENOUS | Status: AC
  Administered 2022-05-31: 500 mL via INTRAVENOUS

## 2022-05-31 NOTE — Discharge Instructions (Signed)
Drink plenty of fluids and follow-up with your family doctor within a week for recheck. return if any problems

## 2022-05-31 NOTE — ED Triage Notes (Signed)
"  She walked up to base again today and complained of dizziness, denies any head injury, n/v/d or any other symptoms" per EMS "I just still feel weak and get dizzy when I get up" per pt

## 2022-05-31 NOTE — ED Provider Notes (Signed)
[place] Provider Note   CSN: [id number] Arrival date & time: 05/31/22  6314     History  Chief Complaint  Patient presents with   Dizziness    Melody Mitchell is a 81 y.o. female.  Patient complains of some dizziness and weakness.  Patient has a history of hyperlipidemia.  The history is provided by the patient and medical records. No language interpreter was used.  Dizziness Quality:  Lightheadedness Severity:  Mild Onset quality:  Sudden Timing:  Intermittent Progression:  Waxing and waning Chronicity:  New Context: not when bending over   Relieved by:  Nothing Associated symptoms: no chest pain, no diarrhea and no headaches        Home Medications Prior to Admission medications   Medication Sig Start Date End Date Taking? Authorizing Provider  amitriptyline (ELAVIL) 25 MG tablet Take 25 mg by mouth at bedtime.    [provider]  aspirin EC 325 MG tablet Take 325 mg by mouth daily. Patient not taking: Reported on 05/27/2022 03/20/20   [provider]  atorvastatin (LIPITOR) 40 MG tablet Take 40 mg by mouth daily.    [provider]  escitalopram (LEXAPRO) 10 MG tablet Take 10 mg by mouth daily. Patient not taking: Reported on 05/27/2022    [provider]  esomeprazole (NEXIUM) 40 MG capsule Take 40 mg by mouth daily before breakfast.    [provider]  ezetimibe (ZETIA) 10 MG tablet Take 10 mg by mouth in the morning.  11/11/19   [provider]  potassium chloride SA (KLOR-CON) 20 MEQ tablet Take 20 mEq by mouth 2 (two) times daily. 03/20/20   [provider]      Allergies    Latex, Sulfa antibiotics, Other, and Pravastatin sodium    Review of Systems   Review of Systems  Constitutional:  Negative for appetite change and fatigue.  HENT:  Negative for congestion, ear discharge and sinus pressure.   Eyes:  Negative for discharge.  Respiratory:  Negative for cough.    Cardiovascular:  Negative for chest pain.  Gastrointestinal:  Negative for abdominal pain and diarrhea.  Genitourinary:  Negative for frequency and hematuria.  Musculoskeletal:  Negative for back pain.  Skin:  Negative for rash.  Neurological:  Positive for dizziness. Negative for seizures and headaches.  Psychiatric/Behavioral:  Negative for hallucinations.     Physical Exam Updated Vital Signs BP 126/74   Pulse 71   Temp 98 F (36.7 C) (Oral)   Resp 18   Ht '5\' 4"'$  (1.626 m)   Wt 59 kg   SpO2 99%   BMI 22.31 kg/m  Physical Exam Vitals and nursing note reviewed.  Constitutional:      Appearance: She is well-developed.  HENT:     Head: Normocephalic.     Nose: Nose normal.  Eyes:     General: No scleral icterus.    Conjunctiva/sclera: Conjunctivae normal.  Neck:     Thyroid: No thyromegaly.  Cardiovascular:     Rate and Rhythm: Normal rate and regular rhythm.     Heart sounds: No murmur heard.    No friction rub. No gallop.  Pulmonary:     Breath sounds: No stridor. No wheezing or rales.  Chest:     Chest wall: No tenderness.  Abdominal:     General: There is no distension.     Tenderness: There is no abdominal tenderness. There is no rebound.  Musculoskeletal:  General: Normal range of motion.     Cervical back: Neck supple.  Lymphadenopathy:     Cervical: No cervical adenopathy.  Skin:    Findings: No erythema or rash.  Neurological:     Mental Status: She is alert and oriented to person, place, and time.     Motor: No abnormal muscle tone.     Coordination: Coordination normal.  Psychiatric:        Behavior: Behavior normal.     ED Results / Procedures / Treatments   Labs (all labs ordered are listed, but only abnormal results are displayed) Labs Reviewed  CBC WITH DIFFERENTIAL/PLATELET - Abnormal; Notable for the following components:      Result Value   RBC 2.90 (*)    Hemoglobin 9.1 (*)    HCT 29.7 (*)    MCV 102.4 (*)    RDW 15.6 (*)     Lymphs Abs 0.6 (*)    All other components within normal limits  URINALYSIS, ROUTINE W REFLEX MICROSCOPIC - Abnormal; Notable for the following components:   Color, Urine STRAW (*)    Specific Gravity, Urine 1.004 (*)    All other components within normal limits  COMPREHENSIVE METABOLIC PANEL  TROPONIN I (HIGH SENSITIVITY)  TROPONIN I (HIGH SENSITIVITY)    EKG EKG Interpretation  Date/Time:  Tuesday May 31 2022 12:16:05 EDT Ventricular Rate:  78 PR Interval:  157 QRS Duration: 94 QT Interval:  375 QTC Calculation: 428 R Axis:   -23 Text Interpretation: Sinus rhythm Borderline left axis deviation Abnormal R-wave progression, early transition Confirmed by Milton Ferguson [phone]) on 05/31/2022 2:41:55 PM  Radiology DG Chest Port 1 View  Result Date: 05/31/2022 CLINICAL DATA:  Shortness of breath EXAM: PORTABLE CHEST 1 VIEW COMPARISON:  05/24/2022 FINDINGS: Cardiac size is within normal limits. Thoracic aorta is tortuous and ectatic. Lung fields are clear of any infiltrates or pulmonary edema. There is no significant pleural effusion or pneumothorax. IMPRESSION: No active disease. Electronically Signed   By: Elmer Picker M.D.   On: 05/31/2022 11:21    Procedures Procedures    Medications Ordered in ED Medications  sodium chloride 0.9 % bolus 500 mL (500 mLs Intravenous New Bag/Given 05/31/22 1140)    ED Course/ Medical Decision Making/ A&P Labs x-rays and EKG unremarkable.  Patient improved with 500 cc normal saline                         Medical Decision Making Amount and/or Complexity of Data Reviewed Labs: ordered. Radiology: ordered. ECG/medicine tests: ordered.   This patient presents to the ED for concern of patient complains of weakness and history of hyperlipidemia, this involves an extensive number of treatment options, and is a complaint that carries with it a high risk of complications and morbidity.  The differential diagnosis includes dehydration,  anemia   Co morbidities that complicate the patient evaluation  Hyperlipidemia   Additional history obtained:  Additional history obtained from family External records from outside source obtained and reviewed including hospital records   Lab Tests:  I Ordered, and personally interpreted labs.  The pertinent results include: Hemoglobin 9.0, chemistries unremarkable   Imaging Studies ordered:  I ordered imaging studies including chest x-ray I independently visualized and interpreted imaging which showed negative I agree with the radiologist interpretation   Cardiac Monitoring: / EKG:  The patient was maintained on a cardiac monitor.  I personally viewed and interpreted the cardiac monitored which showed  an underlying rhythm of: Normal sinus rhythm   Consultations Obtained:  No consult  Problem List / ED Course / Critical interventions / Medication management  Hyperlipidemia, anemia, dehydration I ordered medication including normal saline for dehydration Reevaluation of the patient after these medicines showed that the patient improved I have reviewed the patients home medicines and have made adjustments as needed   Social Determinants of Health:  None   Test / Admission - Considered:  No other test considered    Patient with dehydration.  She improved with IV fluids and will follow-up with PCP        Final Clinical Impression(s) / ED Diagnoses Final diagnoses:  Dizziness  Dehydration    Rx / DC Orders ED Discharge Orders     None         Milton Ferguson, MD 06/02/22 1125

## 2022-06-01 ENCOUNTER — Inpatient Hospital Stay (HOSPITAL_COMMUNITY)
Admission: EM | Admit: 2022-06-01 | Discharge: 2022-06-03 | DRG: 300 | Disposition: A | Discharge status: Home / Self Care | Payer: Medicare Other | Attending: Family Medicine | Admitting: Family Medicine

## 2022-06-01 ENCOUNTER — Encounter (HOSPITAL_COMMUNITY): Payer: Self-pay | Admitting: Emergency Medicine

## 2022-06-01 ENCOUNTER — Other Ambulatory Visit: Payer: Self-pay

## 2022-06-01 DIAGNOSIS — E538 Deficiency of other specified B group vitamins: Secondary | ICD-10-CM | POA: Diagnosis present

## 2022-06-01 DIAGNOSIS — D500 Iron deficiency anemia secondary to blood loss (chronic): Secondary | ICD-10-CM | POA: Diagnosis present

## 2022-06-01 DIAGNOSIS — M1990 Unspecified osteoarthritis, unspecified site: Secondary | ICD-10-CM | POA: Diagnosis present

## 2022-06-01 DIAGNOSIS — I639 Cerebral infarction, unspecified: Secondary | ICD-10-CM | POA: Diagnosis not present

## 2022-06-01 DIAGNOSIS — K449 Diaphragmatic hernia without obstruction or gangrene: Secondary | ICD-10-CM | POA: Diagnosis present

## 2022-06-01 DIAGNOSIS — Z602 Problems related to living alone: Secondary | ICD-10-CM | POA: Diagnosis present

## 2022-06-01 DIAGNOSIS — E876 Hypokalemia: Secondary | ICD-10-CM | POA: Diagnosis present

## 2022-06-01 DIAGNOSIS — F32A Depression, unspecified: Secondary | ICD-10-CM | POA: Diagnosis present

## 2022-06-01 DIAGNOSIS — K219 Gastro-esophageal reflux disease without esophagitis: Secondary | ICD-10-CM | POA: Diagnosis present

## 2022-06-01 DIAGNOSIS — R5382 Chronic fatigue, unspecified: Secondary | ICD-10-CM | POA: Diagnosis present

## 2022-06-01 DIAGNOSIS — E7800 Pure hypercholesterolemia, unspecified: Secondary | ICD-10-CM | POA: Diagnosis present

## 2022-06-01 DIAGNOSIS — R634 Abnormal weight loss: Secondary | ICD-10-CM | POA: Diagnosis present

## 2022-06-01 DIAGNOSIS — F53 Postpartum depression: Secondary | ICD-10-CM

## 2022-06-01 DIAGNOSIS — E785 Hyperlipidemia, unspecified: Secondary | ICD-10-CM

## 2022-06-01 DIAGNOSIS — Z7982 Long term (current) use of aspirin: Secondary | ICD-10-CM

## 2022-06-01 DIAGNOSIS — Z888 Allergy status to other drugs, medicaments and biological substances status: Secondary | ICD-10-CM

## 2022-06-01 DIAGNOSIS — K921 Melena: Secondary | ICD-10-CM | POA: Diagnosis present

## 2022-06-01 DIAGNOSIS — Z9049 Acquired absence of other specified parts of digestive tract: Secondary | ICD-10-CM

## 2022-06-01 DIAGNOSIS — Z91040 Latex allergy status: Secondary | ICD-10-CM

## 2022-06-01 DIAGNOSIS — E860 Dehydration: Secondary | ICD-10-CM | POA: Diagnosis present

## 2022-06-01 DIAGNOSIS — K5520 Angiodysplasia of colon without hemorrhage: Secondary | ICD-10-CM | POA: Diagnosis not present

## 2022-06-01 DIAGNOSIS — R531 Weakness: Secondary | ICD-10-CM

## 2022-06-01 DIAGNOSIS — Z87891 Personal history of nicotine dependence: Secondary | ICD-10-CM | POA: Diagnosis not present

## 2022-06-01 DIAGNOSIS — D649 Anemia, unspecified: Secondary | ICD-10-CM

## 2022-06-01 DIAGNOSIS — Z90710 Acquired absence of both cervix and uterus: Secondary | ICD-10-CM

## 2022-06-01 DIAGNOSIS — Z79899 Other long term (current) drug therapy: Secondary | ICD-10-CM

## 2022-06-01 DIAGNOSIS — I890 Lymphedema, not elsewhere classified: Secondary | ICD-10-CM | POA: Diagnosis not present

## 2022-06-01 DIAGNOSIS — Q2739 Arteriovenous malformation, other site: Principal | ICD-10-CM

## 2022-06-01 DIAGNOSIS — K922 Gastrointestinal hemorrhage, unspecified: Secondary | ICD-10-CM

## 2022-06-01 DIAGNOSIS — I10 Essential (primary) hypertension: Secondary | ICD-10-CM | POA: Diagnosis present

## 2022-06-01 DIAGNOSIS — Z882 Allergy status to sulfonamides status: Secondary | ICD-10-CM

## 2022-06-01 DIAGNOSIS — R195 Other fecal abnormalities: Secondary | ICD-10-CM | POA: Diagnosis not present

## 2022-06-01 DIAGNOSIS — Q2733 Arteriovenous malformation of digestive system vessel: Secondary | ICD-10-CM | POA: Diagnosis not present

## 2022-06-01 DIAGNOSIS — K31819 Angiodysplasia of stomach and duodenum without bleeding: Secondary | ICD-10-CM | POA: Diagnosis not present

## 2022-06-01 LAB — PROTIME-INR
INR: 1 (ref 0.8–1.2)
Prothrombin Time: 12.8 seconds (ref 11.4–15.2)

## 2022-06-01 LAB — CBC WITH DIFFERENTIAL/PLATELET
Abs Immature Granulocytes: 0.02 10*3/uL (ref 0.00–0.07)
Basophils Absolute: 0 10*3/uL (ref 0.0–0.1)
Basophils Relative: 0 %
Eosinophils Absolute: 0.1 10*3/uL (ref 0.0–0.5)
Eosinophils Relative: 2 %
HCT: 27.7 % — ABNORMAL LOW (ref 36.0–46.0)
Hemoglobin: 8.3 g/dL — ABNORMAL LOW (ref 12.0–15.0)
Immature Granulocytes: 0 %
Lymphocytes Relative: 15 %
Lymphs Abs: 0.7 10*3/uL (ref 0.7–4.0)
MCH: 31.2 pg (ref 26.0–34.0)
MCHC: 30 g/dL (ref 30.0–36.0)
MCV: 104.1 fL — ABNORMAL HIGH (ref 80.0–100.0)
Monocytes Absolute: 0.4 10*3/uL (ref 0.1–1.0)
Monocytes Relative: 9 %
Neutro Abs: 3.6 10*3/uL (ref 1.7–7.7)
Neutrophils Relative %: 74 %
Platelets: 273 10*3/uL (ref 150–400)
RBC: 2.66 MIL/uL — ABNORMAL LOW (ref 3.87–5.11)
RDW: 15.5 % (ref 11.5–15.5)
WBC: 4.9 10*3/uL (ref 4.0–10.5)
nRBC: 0 % (ref 0.0–0.2)

## 2022-06-01 LAB — COMPREHENSIVE METABOLIC PANEL
ALT: 12 U/L (ref 0–44)
AST: 18 U/L (ref 15–41)
Albumin: 3.5 g/dL (ref 3.5–5.0)
Alkaline Phosphatase: 46 U/L (ref 38–126)
Anion gap: 6 (ref 5–15)
BUN: 10 mg/dL (ref 8–23)
CO2: 25 mmol/L (ref 22–32)
Calcium: 9 mg/dL (ref 8.9–10.3)
Chloride: 109 mmol/L (ref 98–111)
Creatinine, Ser: 0.86 mg/dL (ref 0.44–1.00)
GFR, Estimated: >60 mL/min (ref >60)
Glucose, Bld: 87 mg/dL (ref 70–99)
Potassium: 3.7 mmol/L (ref 3.5–5.1)
Sodium: 140 mmol/L (ref 135–145)
Total Bilirubin: 0.2 mg/dL — ABNORMAL LOW (ref 0.3–1.2)
Total Protein: 6.4 g/dL — ABNORMAL LOW (ref 6.5–8.1)

## 2022-06-01 LAB — IRON AND TIBC
Iron: 35 ug/dL (ref 28–170)
Saturation Ratios: 14 % (ref 10.4–31.8)
TIBC: 259 ug/dL (ref 250–450)
UIBC: 224 ug/dL

## 2022-06-01 LAB — PHOSPHORUS: Phosphorus: 3 mg/dL (ref 2.5–4.6)

## 2022-06-01 LAB — FERRITIN: Ferritin: 85 ng/mL (ref 11–307)

## 2022-06-01 LAB — MAGNESIUM: Magnesium: 1.9 mg/dL (ref 1.7–2.4)

## 2022-06-01 LAB — VITAMIN B12: Vitamin B-12: 958 pg/mL — ABNORMAL HIGH (ref 180–914)

## 2022-06-01 LAB — PREPARE RBC (CROSSMATCH)

## 2022-06-01 LAB — TROPONIN I (HIGH SENSITIVITY): Troponin I (High Sensitivity): 3 ng/L (ref <18)

## 2022-06-01 LAB — TSH: TSH: 0.697 u[IU]/mL (ref 0.350–4.500)

## 2022-06-01 LAB — FOLATE: Folate: 6.4 ng/mL (ref >5.9)

## 2022-06-01 MED ORDER — ACETAMINOPHEN 650 MG RE SUPP: 650.0000 mg | Freq: Four times a day (QID) | RECTAL | Status: DC | PRN

## 2022-06-01 MED ORDER — HYDROMORPHONE HCL 1 MG/ML IJ SOLN
0.5000 mg | INTRAMUSCULAR | Status: DC | PRN
  Administered 2022-06-02: 0.5 mg via INTRAVENOUS
  Administered 2022-06-02: 1 mg via INTRAVENOUS
  Filled 2022-06-01: qty 1
  Filled 2022-06-01: qty 0.5

## 2022-06-01 MED ORDER — ACETAMINOPHEN 325 MG PO TABS
650.0000 mg | ORAL_TABLET | Freq: Four times a day (QID) | ORAL | Status: DC | PRN
  Administered 2022-06-01 – 2022-06-03 (×3): 650 mg via ORAL
  Filled 2022-06-01 (×4): qty 2

## 2022-06-01 MED ORDER — ESCITALOPRAM OXALATE 10 MG PO TABS
10.0000 mg | ORAL_TABLET | Freq: Every day | ORAL | Status: DC
  Administered 2022-06-01 – 2022-06-03 (×2): 10 mg via ORAL
  Filled 2022-06-01 (×3): qty 1

## 2022-06-01 MED ORDER — AMITRIPTYLINE HCL 25 MG PO TABS
25.0000 mg | ORAL_TABLET | Freq: Every day | ORAL | Status: DC
  Administered 2022-06-01 – 2022-06-02 (×2): 25 mg via ORAL
  Filled 2022-06-01 (×2): qty 1

## 2022-06-01 MED ORDER — SODIUM CHLORIDE 0.9% FLUSH: 3.0000 mL | Freq: Two times a day (BID) | INTRAVENOUS | Status: DC

## 2022-06-01 MED ORDER — EZETIMIBE 10 MG PO TABS
10.0000 mg | ORAL_TABLET | Freq: Every morning | ORAL | Status: DC
  Administered 2022-06-03: 10 mg via ORAL
  Filled 2022-06-01 (×2): qty 1

## 2022-06-01 MED ORDER — PANTOPRAZOLE SODIUM 40 MG IV SOLR
40.0000 mg | Freq: Two times a day (BID) | INTRAVENOUS | Status: DC
  Administered 2022-06-01 – 2022-06-02 (×3): 40 mg via INTRAVENOUS
  Filled 2022-06-01 (×4): qty 10

## 2022-06-01 MED ORDER — HEPARIN SODIUM (PORCINE) 5000 UNIT/ML IJ SOLN: 5000.0000 [IU] | Freq: Three times a day (TID) | INTRAMUSCULAR | Status: DC

## 2022-06-01 MED ORDER — TRAZODONE HCL 50 MG PO TABS: 25.0000 mg | ORAL_TABLET | Freq: Every evening | ORAL | Status: DC | PRN

## 2022-06-01 MED ORDER — BISACODYL 5 MG PO TBEC: 5.0000 mg | DELAYED_RELEASE_TABLET | Freq: Every day | ORAL | Status: DC | PRN

## 2022-06-01 MED ORDER — SODIUM CHLORIDE 0.9% FLUSH
3.0000 mL | Freq: Two times a day (BID) | INTRAVENOUS | Status: DC
  Administered 2022-06-01 – 2022-06-02 (×4): 3 mL via INTRAVENOUS

## 2022-06-01 MED ORDER — ONDANSETRON HCL 4 MG/2ML IJ SOLN
4.0000 mg | Freq: Four times a day (QID) | INTRAMUSCULAR | Status: DC | PRN
  Administered 2022-06-02: 4 mg via INTRAVENOUS
  Filled 2022-06-01: qty 2

## 2022-06-01 MED ORDER — ONDANSETRON HCL 4 MG PO TABS: 4.0000 mg | ORAL_TABLET | Freq: Four times a day (QID) | ORAL | Status: DC | PRN

## 2022-06-01 MED ORDER — PANTOPRAZOLE SODIUM 40 MG IV SOLR
40.0000 mg | Freq: Once | INTRAVENOUS | Status: AC
  Administered 2022-06-01: 40 mg via INTRAVENOUS
  Filled 2022-06-01: qty 10

## 2022-06-01 MED ORDER — HYDRALAZINE HCL 20 MG/ML IJ SOLN: 10.0000 mg | INTRAMUSCULAR | Status: DC | PRN

## 2022-06-01 MED ORDER — SODIUM CHLORIDE 0.9 % IV SOLN: 250.0000 mL | INTRAVENOUS | Status: DC | PRN

## 2022-06-01 MED ORDER — ATORVASTATIN CALCIUM 40 MG PO TABS
40.0000 mg | ORAL_TABLET | Freq: Every day | ORAL | Status: DC
  Administered 2022-06-01 – 2022-06-03 (×2): 40 mg via ORAL
  Filled 2022-06-01 (×3): qty 1

## 2022-06-01 MED ORDER — SODIUM CHLORIDE 0.9% FLUSH: 3.0000 mL | INTRAVENOUS | Status: DC | PRN

## 2022-06-01 MED ORDER — SODIUM CHLORIDE 0.9 % IV SOLN: INTRAVENOUS | Status: AC

## 2022-06-01 MED ORDER — IPRATROPIUM BROMIDE 0.02 % IN SOLN: 0.5000 mg | Freq: Four times a day (QID) | RESPIRATORY_TRACT | Status: DC | PRN

## 2022-06-01 MED ORDER — LEVALBUTEROL HCL 0.63 MG/3ML IN NEBU: 0.6300 mg | INHALATION_SOLUTION | Freq: Four times a day (QID) | RESPIRATORY_TRACT | Status: DC | PRN

## 2022-06-01 MED ORDER — FLEET ENEMA 7-19 GM/118ML RE ENEM: 1.0000 | ENEMA | Freq: Once | RECTAL | Status: DC | PRN

## 2022-06-01 MED ORDER — SODIUM CHLORIDE 0.9 % IV SOLN: 10.0000 mL/h | Freq: Once | INTRAVENOUS | Status: DC

## 2022-06-01 MED ORDER — SENNOSIDES-DOCUSATE SODIUM 8.6-50 MG PO TABS
1.0000 | ORAL_TABLET | Freq: Every evening | ORAL | Status: DC | PRN
Start: 2022-06-01 — End: 2022-06-03

## 2022-06-01 MED ORDER — OXYCODONE HCL 5 MG PO TABS
5.0000 mg | ORAL_TABLET | ORAL | Status: DC | PRN
  Administered 2022-06-02: 5 mg via ORAL
  Filled 2022-06-01: qty 1

## 2022-06-01 MED ORDER — ACETAMINOPHEN 325 MG PO TABS
650.0000 mg | ORAL_TABLET | Freq: Once | ORAL | Status: AC
  Administered 2022-06-01: 650 mg via ORAL
  Filled 2022-06-01: qty 2

## 2022-06-01 NOTE — ED Triage Notes (Signed)
Pt from home via [facility] with reports of feeling "woozy and weak." Pt reports she does live alone and when this happens she gets scared.

## 2022-06-01 NOTE — Assessment & Plan Note (Addendum)
-   Monitoring H&H -Consulted GI, planning for endoscopy on 06/02/2022 -Hemoglobin remained stable

## 2022-06-01 NOTE — Assessment & Plan Note (Signed)
-  Continue statins ?

## 2022-06-01 NOTE — Assessment & Plan Note (Addendum)
On PPI, planning for endoscopy per GI EGD confirmed multiple AVMs x8

## 2022-06-01 NOTE — H&P (Signed)
History and Physical   Patient: Melody Mitchell                            PCP: [name], MD                    DOB: 08/10/1941            DOA: 06/01/2022 [id number]             DOS: 06/01/2022, 2:45 PM  [name], MD  Patient coming from:   HOME   I have personally reviewed patient's medical records, in electronic medical records, including:  [place] [facility], and care everywhere.    Chief Complaint:   Chief Complaint  Patient presents with   Weakness  GI bleed  History of present illness:    Melody Mitchell is a 81 year old female with history of iron deficiency anemia, hemorrhoids, AVMs, GERD, hypokalemia, hypertension, hyperlipidemia, depression, presented to the ED with chief complaint of progressive acute on chronic fatigue and weakness. She denies of any falls, but she has been progressively getting weaker.  She lives alone therefore she is more concerned     ED: Blood pressure 123/78, pulse 71, temperature 98.7 F (37.1 C), temperature source Oral, resp. rate 16, SpO2 97 %.    Latest Ref Rng & Units 06/01/2022   10:36 AM 05/31/2022   11:28 AM 05/28/2022    1:19 PM  CBC  WBC 4.0 - 10.5 K/uL 4.9  5.6  5.4   Hemoglobin 12.0 - 15.0 g/dL 8.3  9.1  9.3   Hematocrit 36.0 - 46.0 % 27.7  29.7  29.6   Platelets 150 - 400 K/uL 273  307  290   B12 958  EKG normal sinus rhythm negative any ST elevation depression  Hemoccult positive Also reporting of some unintentional weight loss Iron studies total iron 35 TIBC 250, ferritin 85, folate 6.4 UA within normal limits Chest x-ray within normal limits    Patient Denies having: Fever, Chills, Cough, SOB, Chest Pain, Abd pain, N/V/D, headache, dizziness, lightheadedness,  Dysuria, Joint pain, rash, open wounds    Review of Systems: As per HPI, otherwise 10 point review of systems were negative.    ----------------------------------------------------------------------------------------------------------------------  Allergies  Allergen Reactions   Latex Hives, Itching and Rash   Sulfa Antibiotics Hives and Itching    Other reaction(s): hives   Other Hives   Pravastatin Sodium Other (See Comments)    Home MEDs:  Prior to Admission medications   Medication Sig Start Date End Date Taking? Authorizing Provider  amitriptyline (ELAVIL) 25 MG tablet Take 25 mg by mouth at bedtime.   Yes [provider]  atorvastatin (LIPITOR) 40 MG tablet Take 40 mg by mouth daily.   Yes [provider]  escitalopram (LEXAPRO) 10 MG tablet Take 10 mg by mouth daily.   Yes [provider]  esomeprazole (NEXIUM) 40 MG capsule Take 40 mg by mouth daily before breakfast.   Yes [provider]  ezetimibe (ZETIA) 10 MG tablet Take 10 mg by mouth in the morning.  11/11/19  Yes [provider]  potassium chloride SA (KLOR-CON) 20 MEQ tablet Take 20 mEq by mouth 2 (two) times daily. 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 four colon polyps removed. No source  for change in bowel habits identified 3. Rectal bleeding due to large internal hemorrhoids    COLONOSCOPY W/ BIOPSIES  6 2010   Dr. Oneida Alar: Polypoid appearing lesion of the ascending colon, 3 mm sessile rectal polyp, small internal hemorrhoids. Pathology revealed polypoid mucosa, no adenomatous changes   Double-balloon enteroscopy, antegrade  April 2011   Dr. 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   ESOPHAGOGASTRODUODENOSCOPY  04/2009   Dr. Oneida Alar: Patent Schatzki ring dilated with advancing the scope, patchy erythema in the antrum, 2 small AVMs in the duodenal bulb, 2 additional AVMs noted in the second portion the duodenum, mild gastritis on pathology   ESOPHAGOGASTRODUODENOSCOPY N/A 06/29/2015   SLF: 1. 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 small hiatal hernia 3. Mild non-erosive gastririts- NO obvious source for dyspepsia identified.    ESOPHAGOGASTRODUODENOSCOPY N/A 03/13/2020   Fields: Low-grade narrowing Schatzki ring, small hiatal hernia, multiple nonbleeding angiectasia's in the jejunum treated with APC.  Spot tattoo in jejunum at 90 cm from the incisors.   EYE SURGERY       reports that she quit smoking about 14 years ago. Her smoking use included cigarettes. She has a 25.00 pack-year smoking history. She has never used smokeless tobacco. She reports that she does not currently use alcohol. She reports that she does not use drugs.   Family History  Problem Relation Age of Onset   Heart failure Mother    Diabetes Sister    Heart attack Father    Colon cancer Neg Hx    Gastric cancer Neg Hx    Esophageal cancer Neg Hx     Physical Exam:   Vitals:   06/01/22 1030 06/01/22 1100 06/01/22 1325 06/01/22 1400  BP: (!) 142/80 128/79 109/82 123/78  Pulse:   72 71  Resp: (!) '24 19 19 16  '$ Temp:      TempSrc:      SpO2:   95% 97%   Constitutional: NAD, calm, comfortable Eyes: PERRL,  lids and conjunctivae normal ENMT: Mucous membranes are moist. Posterior pharynx clear of any exudate or lesions.Normal dentition.  Neck: normal, supple, no masses, no thyromegaly Respiratory: clear to auscultation bilaterally, no wheezing, no crackles. Normal respiratory effort. No accessory muscle use.  Cardiovascular: Regular rate and rhythm, no murmurs / rubs / gallops. No extremity edema. 2+ pedal pulses. No carotid bruits.  Abdomen: no tenderness, no masses palpated. No hepatosplenomegaly. Bowel sounds positive.  Musculoskeletal: Global generalized emesis, no clubbing / cyanosis. No joint deformity upper and lower extremities. Good ROM, no contractures. Normal muscle tone.  Neurologic: CN II-XII grossly intact. Sensation intact, DTR normal. Strength 5/5 in all 4.  Psychiatric: Normal  judgment and insight. Alert and oriented x 3. Normal mood.  Skin: no rashes, lesions, ulcers. No induration Decubitus/ulcers:  Wounds: per nursing documentation   Labs on admission:    I have personally reviewed following labs and imaging studies  CBC: Recent Labs  Lab 05/27/22 1101 05/28/22 1319 05/31/22 1128 06/01/22 1036  WBC 6.2 5.4 5.6 4.9  NEUTROABS 5.0 3.9 4.6 3.6  HGB 10.0* 9.3* 9.1* 8.3*  HCT 32.4* 29.6* 29.7* 27.7*  MCV 102.5* 100.3* 102.4* 104.1*  PLT 290 290 307 998   Basic Metabolic Panel: Recent Labs  Lab 05/27/22 1101 05/28/22 1319 05/31/22 1128 06/01/22 1036  NA 141 138 140 140  K 3.9 3.9 4.0 3.7  CL 108 107 108 109  CO2 '27 24 26 25  '$ GLUCOSE 93 85 89 87  BUN '9 9 8 10  '$ CREATININE 0.99 0.87 0.82 0.86  CALCIUM 9.6 9.2 9.0 9.0  MG 1.9  --   --   --    GFR: Estimated Creatinine Clearance: 42.8 mL/min (by C-G formula based on SCr of 0.86 mg/dL). Liver Function Tests: Recent Labs  Lab 05/28/22 1319 05/31/22 1128 06/01/22 1036  AST '20 20 18  '$ ALT '11 13 12  '$ ALKPHOS 55 56 46  BILITOT 0.5 0.3 0.2*  PROT 6.9 6.9 6.4*  ALBUMIN 3.8 3.7 3.5    CBG: Recent Labs  Lab  05/28/22 1318  GLUCAP 83   Lipid Profile: No results for input(s): "CHOL", "HDL", "LDLCALC", "TRIG", "CHOLHDL", "LDLDIRECT" in the last 72 hours. Thyroid Function Tests: Recent Labs    06/01/22 1039  TSH 0.697   Anemia Panel: Recent Labs    06/01/22 1039  VITAMINB12 958*  FOLATE 6.4  FERRITIN 85  TIBC 259  IRON 35   Urine analysis:    Component Value Date/Time   COLORURINE STRAW (A) 05/31/2022 1128   APPEARANCEUR CLEAR 05/31/2022 1128   LABSPEC 1.004 (L) 05/31/2022 1128   PHURINE 8.0 05/31/2022 1128   GLUCOSEU NEGATIVE 05/31/2022 1128   HGBUR NEGATIVE 05/31/2022 1128   BILIRUBINUR NEGATIVE 05/31/2022 1128   KETONESUR NEGATIVE 05/31/2022 1128   PROTEINUR NEGATIVE 05/31/2022 1128   UROBILINOGEN 0.2 06/05/2015 1357   NITRITE NEGATIVE 05/31/2022 1128   LEUKOCYTESUR NEGATIVE 05/31/2022 1128    Last A1C:  No results found for: "HGBA1C"   Radiologic Exams on Admission:   DG Chest Port 1 View  Result Date: 05/31/2022 CLINICAL DATA:  Shortness of breath EXAM: PORTABLE CHEST 1 VIEW COMPARISON:  05/24/2022 FINDINGS: Cardiac size is within normal limits. Thoracic aorta is tortuous and ectatic. Lung fields are clear of any infiltrates or pulmonary edema. There is no significant pleural effusion or pneumothorax. IMPRESSION: No active disease. Electronically Signed   By: Elmer Picker M.D.   On: 05/31/2022 11:21    EKG:   Independently reviewed.  Orders placed or performed during the hospital encounter of 06/01/22   ED EKG   ED EKG   EKG 12-Lead   EKG 12-Lead   EKG 12-Lead   ---------------------------------------------------------------------------------------------------------------------------------------    Assessment / Plan:   Principal Problem:   GIB (gastrointestinal bleeding) Active Problems:   Weakness   Dark stools   Arteriovenous malformation of jejunum   GERD   Depression   Iron deficiency anemia due to chronic blood loss    Hyperlipidemia   Heme positive stool   Stroke (cerebrum) (HCC)   Assessment and Plan: * GIB (gastrointestinal bleeding) - Hemoccult positive  - steady drop in H&H noted -ED staff initiated 1  unit PRBC transfusion -IV Protonix -Consulted, n.p.o. after midnight Dissipating endoscopy on 06/02/2022  Weakness - Consulting PT/OT for evaluation recommendation -Cause including blood loss anemia  Depression - Currently stable, resuming home medications: Elavil, Lexapro  GERD - Continue Protonix 40 mg IV twice daily  Arteriovenous malformation of jejunum On PPI, planning for endoscopy per GI  Heme positive stool - Monitoring H&H -Consulted GI, planning for endoscopy on 06/02/2022 -NPO after midnight  Hyperlipidemia - Continue statins  Iron deficiency anemia due to chronic blood loss Monitoring H&H closely, -Baseline hemoglobin around 10, down to 8.3 -ED provider is planning to transfuse 1 unit of PRBC Since patient is symptomatic with severe weakness    Latest Ref Rng & Units 06/01/2022   10:36 AM 05/31/2022   11:28 AM 05/28/2022    1:19 PM  CBC  WBC 4.0 - 10.5 K/uL 4.9  5.6  5.4   Hemoglobin 12.0 - 15.0 g/dL 8.3  9.1  9.3   Hematocrit 36.0 - 46.0 % 27.7  29.7  29.6   Platelets 150 - 400 K/uL 273  307  290        Consults called: Gastroenterologist -------------------------------------------------------------------------------------------------------------------------------------------- DVT prophylaxis:  Place and maintain sequential compression device Start: 06/01/22 1431 TED hose Start: 06/01/22 1426 SCDs Start: 06/01/22 1426   Code Status:   Code Status: Full Code   Admission status: Patient will be admitted as Inpatient, with a greater than 2 midnight length of stay. Level of care: Telemetry   Family Communication:  none at bedside  (The above findings and plan of care has been discussed with patient in detail, the patient expressed understanding and  agreement of above plan)  --------------------------------------------------------------------------------------------------------------------------------------------------  Disposition Plan:  Anticipated 1-2 days Status is: Inpatient Remains inpatient appropriate because: Needing IV fluids, IV Protonix, GI evaluation and procedure for GI bleed     ----------------------------------------------------------------------------------------------------------------------------------------------------  Time spent: > than  11  Min.   SIGNED: Deatra James, MD, FHM. [facility],  Pager (Please use [email or web address] to page to text)  If 7PM-7AM, please contact night-coverage [email or web address],  06/01/2022, 2:45 PM

## 2022-06-01 NOTE — Consult Note (Signed)
Referring Provider: No ref. provider found Primary Care Physician:  [name], MD Primary Gastroenterologist:  Dr. Abbey Chatters  Date of Admission: 06/01/22 Date of Consultation: 06/01/22  Reason for Consultation:  symptomatic, acute on chronic anemia   HPI:  Melody Mitchell is a 81 y.o. year old female with pmh of GERD, arthritis, AVMs, Hiatal hernia, High cholesterol, HTN, IDA, vertigo who presented to the ED today with weakness that is chronically occurring, 1-2x/week without any pattern or precipitating factors. Denied dizziness, chest pain, SOB, nausea, abdominal pain or rectal bleeding. Hemoglobin found to be 8.3 on admission, appears baseline is around 10. Notably hgb was 9.1 yesterday when patient was seen then for dizziness and weakness, though sent home after IVF.   ED Course: VSS  Hgb 8.3, MCV 104.1 Electrolytes WNL, BUN normal at 10 Total protein 6.4, LFTs WNL TSH 0.697 Trops normal FOBT positive   Consult: 05/03/22 B12 691, Iron 41, TIBC 275, Sat 15%, ferritin 42, saturation previously low at 10% on 03/08/22. Folate 6.6 on 12/20/21.   Patient is a/ox 4 though cognition still seems somewhat impaired during my assessment, she is unable to provide much elaboration of HPI. She reports she lives alone.  Patients reports she began feeling weak, fatigued yesterday, has had some intermittent dizziness but no shortness of breath. She denies any changes in bowel habits. She has not seen any blood in stools or melena. No hematemesis or coffee ground emesis. Had some vomiting yesterday but denies any abdominal pain. No nausea today. States she has not been eating very well for the past few weeks, noting some early satiety and decreased appetite during this time. She is unsure how much weight she has lost but does endorse a decline. Usually has a BM once daily but feels like she has to strain, states this has been off and on for some time, though no recent changes in bowel habits. She does not  take anything for her constipation.  Denies dysphagia, odynophagia. Denies any NSAID or etoh use. She is not currently on any PO iron therapy. She is unsure of when her last iron infusions were but per chart review, appears last was 05/12/22, two prior to that in May.  She is prescribed  Nexium '40mg'$  daily, however she tells me she does not think she has been taking this as she has not been having GERD symptoms.  Also per chart review, last visit with heme/onc was 05/10/22, with some noted prior weight loss of 15-20 pounds, though stable since Feb 2023. She endorsed decreased appetite and fatigue at that time as well. Previously with vitamin D Deficiency but most recent labs in June with elevated Vitamin D level due to over supplementation that she was instructed to stop. Currently on B12 supplementation for ongoing borderline B12 deficiency. Question of compliance with the provided medical instructions at that time.   Last abdominal imaging: CT A/P with contrast, Dec 2022: unremarkable other than small umbilical hernia containing fat Last EGD: 03/13/20 Dr. Oneida Alar. Low-grade of narrowing Schatzki ring. - Small hiatal hernia. - Normal examined duodenum. 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 Treated with argon plasma coagulation (APC). Clip (MR conditional) was placed TO PREVENT BLEEDING. -SPOT TATTOO IN JEJUNUM ~90 CM FROM THE INCISORS. Last Colonoscopy:06/29/15 left colon redundant, four polyps,large internal hemorrhoids. 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 four colon polyps removed. No source  for change in bowel habits identified 3. Rectal bleeding due to large internal hemorrhoids    COLONOSCOPY W/ BIOPSIES  6 2010   Dr. Oneida Alar: Polypoid appearing lesion of the ascending colon, 3 mm sessile rectal polyp, small internal hemorrhoids. Pathology revealed polypoid mucosa, no adenomatous changes   Double-balloon enteroscopy, antegrade  April 2011   Dr. 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   ESOPHAGOGASTRODUODENOSCOPY  04/2009   Dr. Oneida Alar: Patent Schatzki ring dilated with advancing the scope, patchy erythema in the antrum, 2 small AVMs in the duodenal bulb, 2 additional AVMs noted in the second portion the duodenum, mild gastritis on pathology   ESOPHAGOGASTRODUODENOSCOPY N/A 06/29/2015   SLF: 1. 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 small hiatal hernia 3. Mild non-erosive gastririts- NO obvious source for dyspepsia identified.    ESOPHAGOGASTRODUODENOSCOPY N/A 03/13/2020   Fields: Low-grade narrowing Schatzki ring, small hiatal hernia, multiple nonbleeding angiectasia's in the jejunum treated with APC.  Spot tattoo in jejunum at 90 cm from the incisors.   EYE SURGERY      Prior to Admission medications   Medication Sig Start Date End Date Taking? Authorizing Provider  amitriptyline (ELAVIL) 25 MG tablet Take 25 mg by mouth at bedtime.    [provider]  aspirin EC 325 MG tablet Take 325 mg by mouth daily. Patient not taking: Reported on 05/27/2022 03/20/20   [provider]  atorvastatin (LIPITOR) 40 MG tablet Take 40 mg by mouth daily.    [provider]  escitalopram (LEXAPRO) 10 MG tablet Take 10 mg by mouth daily. Patient not taking: Reported on 05/27/2022     [provider]  esomeprazole (NEXIUM) 40 MG capsule Take 40 mg by mouth daily before breakfast.    [provider]  ezetimibe (ZETIA) 10 MG tablet Take 10 mg by mouth in the morning.  11/11/19   [provider]  potassium chloride SA (KLOR-CON) 20 MEQ tablet Take 20 mEq by mouth 2 (two) times daily. 03/20/20   [provider]    No current facility-administered medications for this encounter.   Current Outpatient Medications  Medication Sig Dispense Refill   amitriptyline (ELAVIL) 25 MG tablet Take 25 mg by mouth at bedtime.     aspirin EC 325 MG tablet Take 325 mg by mouth daily. (Patient not taking: Reported on 05/27/2022)     atorvastatin (LIPITOR) 40 MG tablet Take 40 mg by mouth daily.     escitalopram (LEXAPRO) 10 MG tablet Take 10 mg by mouth daily. (Patient not taking: Reported on 05/27/2022)     esomeprazole (NEXIUM) 40 MG capsule Take 40 mg by mouth daily before breakfast.     ezetimibe (ZETIA) 10 MG tablet Take 10 mg by mouth in the morning.      potassium  chloride SA (KLOR-CON) 20 MEQ tablet Take 20 mEq by mouth 2 (two) times daily.      Allergies as of 06/01/2022 - Review Complete 06/01/2022  Allergen Reaction Noted   Latex Hives, Itching, and Rash 07/01/2009   Sulfa antibiotics Hives and Itching 11/21/2013   Other Hives 12/07/2020   Pravastatin sodium Other (See Comments) 12/07/2020    Family History  Problem Relation Age of Onset   Heart failure Mother    Diabetes Sister    Heart attack Father    Colon cancer Neg Hx    Gastric cancer Neg Hx    Esophageal cancer Neg Hx     Social History   Socioeconomic History   Marital status: Divorced    Spouse name: Not on file   Number of children: 1   Years of education: Not on file   Highest education level: Not on file  Occupational History   Not on file  Tobacco Use   Smoking status: Former    Packs/day: 1.00    Years: 25.00    Total pack years: 25.00    Types: Cigarettes     Quit date: 09/14/2007    Years since quitting: 14.7   Smokeless tobacco: Never  Vaping Use   Vaping Use: Never used  Substance and Sexual Activity   Alcohol use: Not Currently    Comment: occasional/rare (Brandy every now and then)   Drug use: No   Sexual activity: Yes    Birth control/protection: Post-menopausal, Surgical  Other Topics Concern   Not on file  Social History Narrative   Not on file   Social Determinants of Health   Financial Resource Strain: Not on file  Food Insecurity: Not on file  Transportation Needs: Not on file  Physical Activity: Not on file  Stress: Not on file  Social Connections: Not on file  Intimate Partner Violence: Not on file    Review of Systems: Gen: Denies fever, chills, +decreased appetite +weight loss CV: Denies chest pain, heart palpitations, syncope, edema +dizziness Resp: Denies shortness of breath with rest, cough, wheezing GI: denies melena, hematochezia, nausea, diarrhea, dysphagia, odynophagia. +vomiting +constipation +early satiety +weight loss  GU : Denies urinary burning, urinary frequency, urinary incontinence.  MS: Denies joint pain,swelling, cramping Derm: Denies rash, itching, dry skin Psych: Denies depression, anxiety,confusion, or memory loss Heme: Denies bruising, bleeding, and enlarged lymph nodes.  Physical Exam: Vital signs in last 24 hours: Temp:  [98 F (36.7 C)-98.7 F (37.1 C)] 98.7 F (37.1 C) (07/19 0935) Pulse Rate:  [70-87] 82 (07/19 1000) Resp:  [16-24] 19 (07/19 1100) BP: (104-142)/(70-88) 128/79 (07/19 1100) SpO2:  [94 %-99 %] 94 % (07/19 1000)   General:   Alert,  Well-developed, well-nourished, pleasant and cooperative in NAD Head:  Normocephalic and atraumatic. Eyes:  Sclera clear, no icterus.   Conjunctiva pink. Ears:  Normal auditory acuity. Nose:  No deformity, discharge,  or lesions. Mouth:  No deformity or lesions, dentition normal. Lungs:  Clear throughout to auscultation.   No wheezes,  crackles, or rhonchi. No acute distress. Heart:  Regular rate and rhythm; no murmurs, clicks, rubs,  or gallops. Abdomen:  Soft, BS present and normal, abdomen non distended. TTP of epigastric region and LLQ, though no guarding or rebound.  No masses, hepatosplenomegaly or hernias noted.  Rectal:  Deferred until time of colonoscopy.   Msk:  Symmetrical without gross deformities. Normal posture. Pulses:  Normal pulses noted. Extremities:  Without clubbing or edema. Neurologic:  Alert  and  oriented x4 though still appears to have some cognitive delay at times Skin:  Intact without significant lesions or rashes. Psych:  Alert and cooperative. Normal mood and affect.  Intake/Output from previous day: No intake/output data recorded. Intake/Output this shift: No intake/output data recorded.  Lab Results: Recent Labs    05/31/22 1128 06/01/22 1036  WBC 5.6 4.9  HGB 9.1* 8.3*  HCT 29.7* 27.7*  PLT 307 273   BMET Recent Labs    05/31/22 1128  NA 140  K 4.0  CL 108  CO2 26  GLUCOSE 89  BUN 8  CREATININE 0.82  CALCIUM 9.0   LFT Recent Labs    05/31/22 1128  PROT 6.9  ALBUMIN 3.7  AST 20  ALT 13  ALKPHOS 56  BILITOT 0.3   Studies/Results: DG Chest Port 1 View  Result Date: 05/31/2022 CLINICAL DATA:  Shortness of breath EXAM: PORTABLE CHEST 1 VIEW COMPARISON:  05/24/2022 FINDINGS: Cardiac size is within normal limits. Thoracic aorta is tortuous and ectatic. Lung fields are clear of any infiltrates or pulmonary edema. There is no significant pleural effusion or pneumothorax. IMPRESSION: No active disease. Electronically Signed   By: Elmer Picker M.D.   On: 05/31/2022 11:21    Impression: Melody Mitchell is a 81 y.o. year old female with pmh of GERD, arthritis, AVMs, Hiatal hernia, High cholesterol, HTN, IDA, vertigo who presented to the ED today with weakness that is chronically occurring, 1-2x/week without any pattern or precipitating factors. Denied chest pain, SOB,  nausea, abdominal pain or rectal bleeding. Hemoglobin found to be 8.3 on admission down from baseline 9-10 range, with positive FOBT, GI consulted for further evaluation.   Acute on Chronic Anemia: hgb 8.3 on admission, baseline appears to be 9-10 range. Endorses fatigue and some dizziness for the past day or so,  history of IDA, with AVMs, noted on last EGD in 2021, followed by Hematology and [facility]. Iron infusions on 03/14/22, 03/21/22 and 05/12/22. IDA previously felt to be secondary to chronic occult GI bleeding in setting of known AVMs of small bowel. Denies rectal bleeding or melena but endorses some early satiety, decreased appetite and weight loss, though per chart review weight appears stable since feb 2023. Is not taking her outpatient PPI, reportedly. Given early satiety and decreased appetite as well as with consideration of her hx of jejunal AVMs, will proceed with EGD with push enteroscopy tomorrow for further evaluation of GI bleeding as we cannot rule out recurrence of bleeding AVMs, PUD, duodenitis or less likely, malignancy. Considerations to proceed with Colonoscopy possibly on Friday if no upper source of bleeding is found. Indications, risks and benefits of procedure discussed in detail with patient. Patient verbalized understanding and is in agreement to proceed with EGD+push enteroscopy.  Plan: Check iron studies, B12, Folate Monitor for Overt GI bleeding Trend h&h, transfuse for hgb<7 PPI BID EGD w/push enteroscopy tomorrow consider colonoscopy on Friday if EGD unremarkable Liquid diet today, NPO midnight  Avoid NSAIDs.   LOS: 0 days    06/01/2022, 11:38 AM   Nahzir Pohle L. Alver Sorrow, MSN, APRN, AGNP-C Adult-Gerontology Nurse Practitioner [facility]

## 2022-06-01 NOTE — Assessment & Plan Note (Addendum)
-   Hemoccult positive  - steady drop in H&H noted -This post 1 unit PRBC transfusion, hemoglobin 8.3 >> 9.7 today -IV Protonix >> switch to p.o. 40 mg twice daily per GI recommendations  Post upper endoscopy, EGD 06/02/2022 Normal esophagus. Small hiatal hernia. Multiple small bowel AVMs total 8 which was cauterized and stabilized- ablated as -Small bowel lymphangiectasia  -Recommended colonoscopy as an outpatient

## 2022-06-01 NOTE — Assessment & Plan Note (Addendum)
-   Consulting PT/OT for evaluation recommendation-recommended home health -Cause including blood loss anemia -More stable, home health PT OT has been arranged

## 2022-06-01 NOTE — H&P (View-Only) (Signed)
Referring Provider: No ref. provider found Primary Care Physician:  [name], MD Primary Gastroenterologist:  Dr. Abbey Chatters  Date of Admission: 06/01/22 Date of Consultation: 06/01/22  Reason for Consultation:  symptomatic, acute on chronic anemia   HPI:  Melody Mitchell is a 81 y.o. year old female with pmh of GERD, arthritis, AVMs, Hiatal hernia, High cholesterol, HTN, IDA, vertigo who presented to the ED today with weakness that is chronically occurring, 1-2x/week without any pattern or precipitating factors. Denied dizziness, chest pain, SOB, nausea, abdominal pain or rectal bleeding. Hemoglobin found to be 8.3 on admission, appears baseline is around 10. Notably hgb was 9.1 yesterday when patient was seen then for dizziness and weakness, though sent home after IVF.   ED Course: VSS  Hgb 8.3, MCV 104.1 Electrolytes WNL, BUN normal at 10 Total protein 6.4, LFTs WNL TSH 0.697 Trops normal FOBT positive   Consult: 05/03/22 B12 691, Iron 41, TIBC 275, Sat 15%, ferritin 42, saturation previously low at 10% on 03/08/22. Folate 6.6 on 12/20/21.   Patient is a/ox 4 though cognition still seems somewhat impaired during my assessment, she is unable to provide much elaboration of HPI. She reports she lives alone.  Patients reports she began feeling weak, fatigued yesterday, has had some intermittent dizziness but no shortness of breath. She denies any changes in bowel habits. She has not seen any blood in stools or melena. No hematemesis or coffee ground emesis. Had some vomiting yesterday but denies any abdominal pain. No nausea today. States she has not been eating very well for the past few weeks, noting some early satiety and decreased appetite during this time. She is unsure how much weight she has lost but does endorse a decline. Usually has a BM once daily but feels like she has to strain, states this has been off and on for some time, though no recent changes in bowel habits. She does not  take anything for her constipation.  Denies dysphagia, odynophagia. Denies any NSAID or etoh use. She is not currently on any PO iron therapy. She is unsure of when her last iron infusions were but per chart review, appears last was 05/12/22, two prior to that in May.  She is prescribed  Nexium '40mg'$  daily, however she tells me she does not think she has been taking this as she has not been having GERD symptoms.  Also per chart review, last visit with heme/onc was 05/10/22, with some noted prior weight loss of 15-20 pounds, though stable since Feb 2023. She endorsed decreased appetite and fatigue at that time as well. Previously with vitamin D Deficiency but most recent labs in June with elevated Vitamin D level due to over supplementation that she was instructed to stop. Currently on B12 supplementation for ongoing borderline B12 deficiency. Question of compliance with the provided medical instructions at that time.   Last abdominal imaging: CT A/P with contrast, Dec 2022: unremarkable other than small umbilical hernia containing fat Last EGD: 03/13/20 Dr. Oneida Alar. Low-grade of narrowing Schatzki ring. - Small hiatal hernia. - Normal examined duodenum. 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 Treated with argon plasma coagulation (APC). Clip (MR conditional) was placed TO PREVENT BLEEDING. -SPOT TATTOO IN JEJUNUM ~90 CM FROM THE INCISORS. Last Colonoscopy:06/29/15 left colon redundant, four polyps,large internal hemorrhoids. 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 four colon polyps removed. No source  for change in bowel habits identified 3. Rectal bleeding due to large internal hemorrhoids    COLONOSCOPY W/ BIOPSIES  6 2010   Dr. Oneida Alar: Polypoid appearing lesion of the ascending colon, 3 mm sessile rectal polyp, small internal hemorrhoids. Pathology revealed polypoid mucosa, no adenomatous changes   Double-balloon enteroscopy, antegrade  April 2011   Dr. 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   ESOPHAGOGASTRODUODENOSCOPY  04/2009   Dr. Oneida Alar: Patent Schatzki ring dilated with advancing the scope, patchy erythema in the antrum, 2 small AVMs in the duodenal bulb, 2 additional AVMs noted in the second portion the duodenum, mild gastritis on pathology   ESOPHAGOGASTRODUODENOSCOPY N/A 06/29/2015   SLF: 1. 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 small hiatal hernia 3. Mild non-erosive gastririts- NO obvious source for dyspepsia identified.    ESOPHAGOGASTRODUODENOSCOPY N/A 03/13/2020   Fields: Low-grade narrowing Schatzki ring, small hiatal hernia, multiple nonbleeding angiectasia's in the jejunum treated with APC.  Spot tattoo in jejunum at 90 cm from the incisors.   EYE SURGERY      Prior to Admission medications   Medication Sig Start Date End Date Taking? Authorizing Provider  amitriptyline (ELAVIL) 25 MG tablet Take 25 mg by mouth at bedtime.    [provider]  aspirin EC 325 MG tablet Take 325 mg by mouth daily. Patient not taking: Reported on 05/27/2022 03/20/20   [provider]  atorvastatin (LIPITOR) 40 MG tablet Take 40 mg by mouth daily.    [provider]  escitalopram (LEXAPRO) 10 MG tablet Take 10 mg by mouth daily. Patient not taking: Reported on 05/27/2022     [provider]  esomeprazole (NEXIUM) 40 MG capsule Take 40 mg by mouth daily before breakfast.    [provider]  ezetimibe (ZETIA) 10 MG tablet Take 10 mg by mouth in the morning.  11/11/19   [provider]  potassium chloride SA (KLOR-CON) 20 MEQ tablet Take 20 mEq by mouth 2 (two) times daily. 03/20/20   [provider]    No current facility-administered medications for this encounter.   Current Outpatient Medications  Medication Sig Dispense Refill   amitriptyline (ELAVIL) 25 MG tablet Take 25 mg by mouth at bedtime.     aspirin EC 325 MG tablet Take 325 mg by mouth daily. (Patient not taking: Reported on 05/27/2022)     atorvastatin (LIPITOR) 40 MG tablet Take 40 mg by mouth daily.     escitalopram (LEXAPRO) 10 MG tablet Take 10 mg by mouth daily. (Patient not taking: Reported on 05/27/2022)     esomeprazole (NEXIUM) 40 MG capsule Take 40 mg by mouth daily before breakfast.     ezetimibe (ZETIA) 10 MG tablet Take 10 mg by mouth in the morning.      potassium  chloride SA (KLOR-CON) 20 MEQ tablet Take 20 mEq by mouth 2 (two) times daily.      Allergies as of 06/01/2022 - Review Complete 06/01/2022  Allergen Reaction Noted   Latex Hives, Itching, and Rash 07/01/2009   Sulfa antibiotics Hives and Itching 11/21/2013   Other Hives 12/07/2020   Pravastatin sodium Other (See Comments) 12/07/2020    Family History  Problem Relation Age of Onset   Heart failure Mother    Diabetes Sister    Heart attack Father    Colon cancer Neg Hx    Gastric cancer Neg Hx    Esophageal cancer Neg Hx     Social History   Socioeconomic History   Marital status: Divorced    Spouse name: Not on file   Number of children: 1   Years of education: Not on file   Highest education level: Not on file  Occupational History   Not on file  Tobacco Use   Smoking status: Former    Packs/day: 1.00    Years: 25.00    Total pack years: 25.00    Types: Cigarettes     Quit date: 09/14/2007    Years since quitting: 14.7   Smokeless tobacco: Never  Vaping Use   Vaping Use: Never used  Substance and Sexual Activity   Alcohol use: Not Currently    Comment: occasional/rare (Brandy every now and then)   Drug use: No   Sexual activity: Yes    Birth control/protection: Post-menopausal, Surgical  Other Topics Concern   Not on file  Social History Narrative   Not on file   Social Determinants of Health   Financial Resource Strain: Not on file  Food Insecurity: Not on file  Transportation Needs: Not on file  Physical Activity: Not on file  Stress: Not on file  Social Connections: Not on file  Intimate Partner Violence: Not on file    Review of Systems: Gen: Denies fever, chills, +decreased appetite +weight loss CV: Denies chest pain, heart palpitations, syncope, edema +dizziness Resp: Denies shortness of breath with rest, cough, wheezing GI: denies melena, hematochezia, nausea, diarrhea, dysphagia, odynophagia. +vomiting +constipation +early satiety +weight loss  GU : Denies urinary burning, urinary frequency, urinary incontinence.  MS: Denies joint pain,swelling, cramping Derm: Denies rash, itching, dry skin Psych: Denies depression, anxiety,confusion, or memory loss Heme: Denies bruising, bleeding, and enlarged lymph nodes.  Physical Exam: Vital signs in last 24 hours: Temp:  [98 F (36.7 C)-98.7 F (37.1 C)] 98.7 F (37.1 C) (07/19 0935) Pulse Rate:  [70-87] 82 (07/19 1000) Resp:  [16-24] 19 (07/19 1100) BP: (104-142)/(70-88) 128/79 (07/19 1100) SpO2:  [94 %-99 %] 94 % (07/19 1000)   General:   Alert,  Well-developed, well-nourished, pleasant and cooperative in NAD Head:  Normocephalic and atraumatic. Eyes:  Sclera clear, no icterus.   Conjunctiva pink. Ears:  Normal auditory acuity. Nose:  No deformity, discharge,  or lesions. Mouth:  No deformity or lesions, dentition normal. Lungs:  Clear throughout to auscultation.   No wheezes,  crackles, or rhonchi. No acute distress. Heart:  Regular rate and rhythm; no murmurs, clicks, rubs,  or gallops. Abdomen:  Soft, BS present and normal, abdomen non distended. TTP of epigastric region and LLQ, though no guarding or rebound.  No masses, hepatosplenomegaly or hernias noted.  Rectal:  Deferred until time of colonoscopy.   Msk:  Symmetrical without gross deformities. Normal posture. Pulses:  Normal pulses noted. Extremities:  Without clubbing or edema. Neurologic:  Alert  and  oriented x4 though still appears to have some cognitive delay at times Skin:  Intact without significant lesions or rashes. Psych:  Alert and cooperative. Normal mood and affect.  Intake/Output from previous day: No intake/output data recorded. Intake/Output this shift: No intake/output data recorded.  Lab Results: Recent Labs    05/31/22 1128 06/01/22 1036  WBC 5.6 4.9  HGB 9.1* 8.3*  HCT 29.7* 27.7*  PLT 307 273   BMET Recent Labs    05/31/22 1128  NA 140  K 4.0  CL 108  CO2 26  GLUCOSE 89  BUN 8  CREATININE 0.82  CALCIUM 9.0   LFT Recent Labs    05/31/22 1128  PROT 6.9  ALBUMIN 3.7  AST 20  ALT 13  ALKPHOS 56  BILITOT 0.3   Studies/Results: DG Chest Port 1 View  Result Date: 05/31/2022 CLINICAL DATA:  Shortness of breath EXAM: PORTABLE CHEST 1 VIEW COMPARISON:  05/24/2022 FINDINGS: Cardiac size is within normal limits. Thoracic aorta is tortuous and ectatic. Lung fields are clear of any infiltrates or pulmonary edema. There is no significant pleural effusion or pneumothorax. IMPRESSION: No active disease. Electronically Signed   By: Elmer Picker M.D.   On: 05/31/2022 11:21    Impression: Melody Mitchell is a 81 y.o. year old female with pmh of GERD, arthritis, AVMs, Hiatal hernia, High cholesterol, HTN, IDA, vertigo who presented to the ED today with weakness that is chronically occurring, 1-2x/week without any pattern or precipitating factors. Denied chest pain, SOB,  nausea, abdominal pain or rectal bleeding. Hemoglobin found to be 8.3 on admission down from baseline 9-10 range, with positive FOBT, GI consulted for further evaluation.   Acute on Chronic Anemia: hgb 8.3 on admission, baseline appears to be 9-10 range. Endorses fatigue and some dizziness for the past day or so,  history of IDA, with AVMs, noted on last EGD in 2021, followed by Hematology and [facility]. Iron infusions on 03/14/22, 03/21/22 and 05/12/22. IDA previously felt to be secondary to chronic occult GI bleeding in setting of known AVMs of small bowel. Denies rectal bleeding or melena but endorses some early satiety, decreased appetite and weight loss, though per chart review weight appears stable since feb 2023. Is not taking her outpatient PPI, reportedly. Given early satiety and decreased appetite as well as with consideration of her hx of jejunal AVMs, will proceed with EGD with push enteroscopy tomorrow for further evaluation of GI bleeding as we cannot rule out recurrence of bleeding AVMs, PUD, duodenitis or less likely, malignancy. Considerations to proceed with Colonoscopy possibly on Friday if no upper source of bleeding is found. Indications, risks and benefits of procedure discussed in detail with patient. Patient verbalized understanding and is in agreement to proceed with EGD+push enteroscopy.  Plan: Check iron studies, B12, Folate Monitor for Overt GI bleeding Trend h&h, transfuse for hgb<7 PPI BID EGD w/push enteroscopy tomorrow consider colonoscopy on Friday if EGD unremarkable Liquid diet today, NPO midnight  Avoid NSAIDs.   LOS: 0 days    06/01/2022, 11:38 AM   Tayven Renteria L. Alver Sorrow, MSN, APRN, AGNP-C Adult-Gerontology Nurse Practitioner [facility]

## 2022-06-01 NOTE — Assessment & Plan Note (Addendum)
-   Continue Protonix 40 mg IV twice daily Switch to p.o. twice daily

## 2022-06-01 NOTE — ED Notes (Signed)
Pt given apple juice  

## 2022-06-01 NOTE — Assessment & Plan Note (Signed)
-   Currently stable, resuming home medications: Elavil, Lexapro

## 2022-06-01 NOTE — ED Notes (Addendum)
CSW spoke with pt about possible meal assistance being meals on wheels. CSW updated that typically there is a long wait list for this program. Pt agreeable to CSW giving information to [employer]	[place] [employer] Wheels. CSW left VM for [facility] and awaiting return call. CSW also provided pt with some resource about the senior center in [place] and possible programs that may be beneficial. TOC signing off.   CSW received call back from [employer] [facility] [employer]. CSW placed pt on the wait list for this. They will reach out to pt for further information.

## 2022-06-01 NOTE — Hospital Course (Signed)
Melody Mitchell is a 81 year old female with history of iron deficiency anemia, hemorrhoids, AVMs, GERD, hypokalemia, hypertension, hyperlipidemia, depression, presented to the ED with chief complaint of progressive acute on chronic fatigue and weakness. She denies of any falls, but she has been progressively getting weaker.  She lives alone therefore she is more concerned     ED: Blood pressure 123/78, pulse 71, temperature 98.7 F (37.1 C), temperature source Oral, resp. rate 16, SpO2 97 %.    Latest Ref Rng & Units 06/01/2022   10:36 AM 05/31/2022   11:28 AM 05/28/2022    1:19 PM  CBC  WBC 4.0 - 10.5 K/uL 4.9  5.6  5.4   Hemoglobin 12.0 - 15.0 g/dL 8.3  9.1  9.3   Hematocrit 36.0 - 46.0 % 27.7  29.7  29.6   Platelets 150 - 400 K/uL 273  307  290   B12 958  EKG normal sinus rhythm negative any ST elevation depression  Hemoccult positive Also reporting of some unintentional weight loss Iron studies total iron 35 TIBC 250, ferritin 85, folate 6.4 UA within normal limits Chest x-ray within normal limits

## 2022-06-01 NOTE — Assessment & Plan Note (Addendum)
Monitoring H&H closely, -Baseline hemoglobin around 10, down to 8.3 >> 9.7 -S/P 1U PRBC transfusion 06/01/2022 Tolerated blood transfusion well stable Iron/TIBC/Ferritin/ %Sat    Component Value Date/Time   IRON 35 06/01/2022 1039   TIBC 259 06/01/2022 1039   FERRITIN 85 06/01/2022 1039   IRONPCTSAT 14 06/01/2022 1039

## 2022-06-01 NOTE — ED Provider Notes (Signed)
[facility] EMERGENCY DEPARTMENT Provider Note   CSN: [id number] Arrival date & time: 06/01/22  7124     History  Chief Complaint  Patient presents with   Weakness    Melody Mitchell is a 81 y.o. female with a history of iron deficiency anemia with history of hemorrhoids and AVMs, GERD, history of hypokalemia, hypertension, hypercholesterolemia presenting for reevaluation of complaint of chronic weakness and fatigue.  She lives alone but feels safe and comfortable in her home, however she endorses getting scared that something may happen to her when she is has these weak episodes, she states they occur 1-2 times weekly but has found no pattern to when these episodes occur.  She does feel that she does not eat as well as she should as she does endorse at times feeling too tired to cook.  She denies dizziness, headache, chest pain, shortness of breath, denies nausea, abdominal pain, dysuria.  She has had no rectal bleeding to her knowledge.  She is not currently taking iron supplementation but has in the past.  She denies hot or cold intolerance, she does endorse an unspecified weight loss, stating some of her clothes fit her more loosely than normal.  Of note, review of weights in her chart indicates she weighed 138 pounds 1 year ago, however 8 days ago her weight was recorded at 145 and today it is 130.       The history is provided by the patient.       Home Medications Prior to Admission medications   Medication Sig Start Date End Date Taking? Authorizing Provider  amitriptyline (ELAVIL) 25 MG tablet Take 25 mg by mouth at bedtime.   Yes [provider]  atorvastatin (LIPITOR) 40 MG tablet Take 40 mg by mouth daily.   Yes [provider]  escitalopram (LEXAPRO) 10 MG tablet Take 10 mg by mouth daily.   Yes [provider]  esomeprazole (NEXIUM) 40 MG capsule Take 40 mg by mouth daily before breakfast.   Yes [provider]  ezetimibe (ZETIA) 10  MG tablet Take 10 mg by mouth in the morning.  11/11/19  Yes [provider]  potassium chloride SA (KLOR-CON) 20 MEQ tablet Take 20 mEq by mouth 2 (two) times daily. 03/20/20  Yes [provider]      Allergies    Latex, Sulfa antibiotics, Other, and Pravastatin sodium    Review of Systems   Review of Systems  Constitutional:  Positive for unexpected weight change. Negative for fever.  HENT:  Negative for congestion and sore throat.   Eyes: Negative.   Respiratory:  Negative for chest tightness and shortness of breath.   Cardiovascular:  Negative for chest pain and palpitations.  Gastrointestinal:  Negative for abdominal pain, nausea and vomiting.  Endocrine: Negative for cold intolerance and heat intolerance.  Genitourinary: Negative.   Musculoskeletal:  Negative for arthralgias, joint swelling and neck pain.  Skin: Negative.  Negative for rash and wound.  Neurological:  Positive for weakness. Negative for dizziness, light-headedness, numbness and headaches.  Psychiatric/Behavioral: Negative.      Physical Exam Updated Vital Signs BP 116/79 (BP Location: Right Arm)   Pulse 63   Temp 98.6 F (37 C)   Resp 20   Ht '5\' 4"'$  (1.626 m)   Wt 63.8 kg   SpO2 97%   BMI 24.14 kg/m  Physical Exam Vitals and nursing note reviewed. Exam conducted with a chaperone present.  Constitutional:  Appearance: She is well-developed.  HENT:     Head: Normocephalic and atraumatic.  Eyes:     Conjunctiva/sclera: Conjunctivae normal.  Cardiovascular:     Rate and Rhythm: Normal rate and regular rhythm.     Heart sounds: Normal heart sounds.  Pulmonary:     Effort: Pulmonary effort is normal.     Breath sounds: Normal breath sounds. No wheezing.  Abdominal:     General: Bowel sounds are normal.     Palpations: Abdomen is soft.     Tenderness: There is no abdominal tenderness.  Genitourinary:    Rectum: Guaiac result positive. No tenderness or external hemorrhoid.      Comments: Patient is strongly Hemoccult positive Musculoskeletal:        General: Normal range of motion.     Cervical back: Normal range of motion.  Skin:    General: Skin is warm and dry.  Neurological:     Mental Status: She is alert.     ED Results / Procedures / Treatments   Labs (all labs ordered are listed, but only abnormal results are displayed) Labs Reviewed  CBC WITH DIFFERENTIAL/PLATELET - Abnormal; Notable for the following components:      Result Value   RBC 2.66 (*)    Hemoglobin 8.3 (*)    HCT 27.7 (*)    MCV 104.1 (*)    All other components within normal limits  COMPREHENSIVE METABOLIC PANEL - Abnormal; Notable for the following components:   Total Protein 6.4 (*)    Total Bilirubin 0.2 (*)    All other components within normal limits  VITAMIN B12 - Abnormal; Notable for the following components:   Vitamin B-12 958 (*)    All other components within normal limits  TSH  IRON AND TIBC  FERRITIN  FOLATE  MAGNESIUM  PHOSPHORUS  PROTIME-INR  BASIC METABOLIC PANEL  CBC  PROTIME-INR  APTT  POC OCCULT BLOOD, ED  PREPARE RBC (CROSSMATCH)  TYPE AND SCREEN  TROPONIN I (HIGH SENSITIVITY)    EKG EKG Interpretation  Date/Time:  Wednesday June 01 2022 11:42:15 EDT Ventricular Rate:  77 PR Interval:  161 QRS Duration: 88 QT Interval:  370 QTC Calculation: 419 R Axis:   -20 Text Interpretation: Sinus rhythm Borderline left axis deviation Low voltage, precordial leads Abnormal R-wave progression, early transition Borderline T abnormalities, anterior leads No significant change since last tracing Confirmed by Dorie Rank [phone]) on 06/01/2022 11:44:21 AM  Radiology DG Chest Port 1 View  Result Date: 05/31/2022 CLINICAL DATA:  Shortness of breath EXAM: PORTABLE CHEST 1 VIEW COMPARISON:  05/24/2022 FINDINGS: Cardiac size is within normal limits. Thoracic aorta is tortuous and ectatic. Lung fields are clear of any infiltrates or pulmonary edema. There is no  significant pleural effusion or pneumothorax. IMPRESSION: No active disease. Electronically Signed   By: Elmer Picker M.D.   On: 05/31/2022 11:21    Procedures Procedures    Medications Ordered in ED Medications  pantoprazole (PROTONIX) injection 40 mg (40 mg Intravenous Given 06/01/22 2100)  sodium chloride flush (NS) 0.9 % injection 3 mL (3 mLs Intravenous Given 06/01/22 2105)  0.9 %  sodium chloride infusion ( Intravenous New Bag/Given 06/01/22 1858)  acetaminophen (TYLENOL) tablet 650 mg (650 mg Oral Given 06/01/22 2059)    Or  acetaminophen (TYLENOL) suppository 650 mg ( Rectal See Alternative 06/01/22 2059)  oxyCODONE (Oxy IR/ROXICODONE) immediate release tablet 5 mg (has no administration in time range)  HYDROmorphone (DILAUDID) injection 0.5-1 mg (has no  administration in time range)  traZODone (DESYREL) tablet 25 mg (has no administration in time range)  senna-docusate (Senokot-S) tablet 1 tablet (has no administration in time range)  bisacodyl (DULCOLAX) EC tablet 5 mg (has no administration in time range)  sodium phosphate (FLEET) 7-19 GM/118ML enema 1 enema (has no administration in time range)  ondansetron (ZOFRAN) tablet 4 mg (has no administration in time range)    Or  ondansetron (ZOFRAN) injection 4 mg (has no administration in time range)  ipratropium (ATROVENT) nebulizer solution 0.5 mg (has no administration in time range)  levalbuterol (XOPENEX) nebulizer solution 0.63 mg (has no administration in time range)  hydrALAZINE (APRESOLINE) injection 10 mg (has no administration in time range)  0.9 %  sodium chloride infusion (0 mL/hr Intravenous Hold 06/01/22 1617)  atorvastatin (LIPITOR) tablet 40 mg (40 mg Oral Given 06/01/22 1541)  ezetimibe (ZETIA) tablet 10 mg (has no administration in time range)  amitriptyline (ELAVIL) tablet 25 mg (25 mg Oral Given 06/01/22 2100)  escitalopram (LEXAPRO) tablet 10 mg (10 mg Oral Given 06/01/22 1541)  acetaminophen (TYLENOL) tablet  650 mg (650 mg Oral Given 06/01/22 1322)  pantoprazole (PROTONIX) injection 40 mg (40 mg Intravenous Given 06/01/22 1328)    ED Course/ Medical Decision Making/ A&P                           Medical Decision Making Pt with persistent and worsening weakness presenting today with worsening anemia with hgb of 8.3, was 9.1 ytd, strongly hemoccult positive Last colonoscopy large internal hemorrhoid, unable to appreciate on exam today, 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  With symptomatic anemia, pt will require admission.  Hx of prior need for iron infusions, pt was on oral iron but has run out so stopped taking.  Pt discussed with Dr. Roger Shelter who accept pt for admission, recommending transfusing 1 unit prbc's which was ordered.  .  Amount and/or Complexity of Data Reviewed External Data Reviewed: labs.    Details: labs from prior recent ed visits reviewed. Labs: ordered.    Details: sig labs listed above ECG/medicine tests: independent interpretation performed.    Details: Lad, no sig changes from prior. Discussion of management or test interpretation with external provider(s): Discussion of management or test interpretation with external provider(s): Patient was discussed with Dr. Gala Romney of GI, recommended PPI, Protonix has been given, also recommends clear liquid diet today, n.p.o. after midnight.  Plan for EGD tomorrow.  Discussed with hospitalist Dr. Roger Shelter.  Risk Decision regarding hospitalization.           Final Clinical Impression(s) / ED Diagnoses Final diagnoses:  Gastrointestinal hemorrhage, unspecified gastrointestinal hemorrhage type  Symptomatic anemia    Rx / DC Orders ED Discharge Orders     None         Landis Martins 06/01/22 2241    Dorie Rank, MD 06/04/22 [phone]

## 2022-06-01 NOTE — ED Provider Notes (Signed)
[facility] EMERGENCY DEPARTMENT Provider Note   CSN: [id number] Arrival date & time: 06/01/22  1884     History  Chief Complaint  Patient presents with   Weakness    Melody Mitchell is a 81 y.o. female with a history of iron deficiency anemia with history of hemorrhoids and AVMs, GERD, history of hypokalemia, hypertension, hypercholesterolemia presenting for reevaluation of complaint of chronic weakness and fatigue.  She lives alone but feels safe and comfortable in her home, however she endorses getting scared that something may happen to her when she is has these weak episodes, she states they occur 1-2 times weekly but has found no pattern to when these episodes occur.  She does feel that she does not eat as well as she should as she does endorse at times feeling too tired to cook.  She denies dizziness, headache, chest pain, shortness of breath, denies nausea, abdominal pain, dysuria.  She has had no rectal bleeding to her knowledge.  She is not currently taking iron supplementation but has in the past.  She denies hot or cold intolerance, she does endorse an unspecified weight loss, stating some of her clothes fit her more loosely than normal.  Of note, review of weights in her chart indicates she weighed 138 pounds 1 year ago, however 8 days ago her weight was recorded at 145 and today it is 130.       The history is provided by the patient.       Home Medications Prior to Admission medications   Medication Sig Start Date End Date Taking? Authorizing Provider  amitriptyline (ELAVIL) 25 MG tablet Take 25 mg by mouth at bedtime.   Yes [provider]  atorvastatin (LIPITOR) 40 MG tablet Take 40 mg by mouth daily.   Yes [provider]  escitalopram (LEXAPRO) 10 MG tablet Take 10 mg by mouth daily.   Yes [provider]  esomeprazole (NEXIUM) 40 MG capsule Take 40 mg by mouth daily before breakfast.   Yes [provider]  ezetimibe (ZETIA) 10  MG tablet Take 10 mg by mouth in the morning.  11/11/19  Yes [provider]  potassium chloride SA (KLOR-CON) 20 MEQ tablet Take 20 mEq by mouth 2 (two) times daily. 03/20/20  Yes [provider]      Allergies    Latex, Sulfa antibiotics, Other, and Pravastatin sodium    Review of Systems   Review of Systems  Constitutional:  Positive for unexpected weight change. Negative for fever.  HENT:  Negative for congestion and sore throat.   Eyes: Negative.   Respiratory:  Negative for chest tightness and shortness of breath.   Cardiovascular:  Negative for chest pain and palpitations.  Gastrointestinal:  Negative for abdominal pain, nausea and vomiting.  Endocrine: Negative for cold intolerance and heat intolerance.  Genitourinary: Negative.   Musculoskeletal:  Negative for arthralgias, joint swelling and neck pain.  Skin: Negative.  Negative for rash and wound.  Neurological:  Positive for weakness. Negative for dizziness, light-headedness, numbness and headaches.  Psychiatric/Behavioral: Negative.      Physical Exam Updated Vital Signs BP 123/78   Pulse 71   Temp 98.7 F (37.1 C) (Oral)   Resp 16   SpO2 97%  Physical Exam Vitals and nursing note reviewed. Exam conducted with a chaperone present.  Constitutional:      Appearance: She is well-developed.  HENT:     Head: Normocephalic and atraumatic.  Eyes:  Conjunctiva/sclera: Conjunctivae normal.  Cardiovascular:     Rate and Rhythm: Normal rate and regular rhythm.     Heart sounds: Normal heart sounds.  Pulmonary:     Effort: Pulmonary effort is normal.     Breath sounds: Normal breath sounds. No wheezing.  Abdominal:     General: Bowel sounds are normal.     Palpations: Abdomen is soft.     Tenderness: There is no abdominal tenderness.  Genitourinary:    Rectum: Guaiac result positive. No tenderness or external hemorrhoid.     Comments: Patient is strongly Hemoccult positive Musculoskeletal:         General: Normal range of motion.     Cervical back: Normal range of motion.  Skin:    General: Skin is warm and dry.  Neurological:     Mental Status: She is alert.     ED Results / Procedures / Treatments   Labs (all labs ordered are listed, but only abnormal results are displayed) Labs Reviewed  CBC WITH DIFFERENTIAL/PLATELET - Abnormal; Notable for the following components:      Result Value   RBC 2.66 (*)    Hemoglobin 8.3 (*)    HCT 27.7 (*)    MCV 104.1 (*)    All other components within normal limits  COMPREHENSIVE METABOLIC PANEL - Abnormal; Notable for the following components:   Total Protein 6.4 (*)    Total Bilirubin 0.2 (*)    All other components within normal limits  VITAMIN B12 - Abnormal; Notable for the following components:   Vitamin B-12 958 (*)    All other components within normal limits  TSH  IRON AND TIBC  FERRITIN  FOLATE  MAGNESIUM  PHOSPHORUS  PROTIME-INR  POC OCCULT BLOOD, ED  PREPARE RBC (CROSSMATCH)  TYPE AND SCREEN  TROPONIN I (HIGH SENSITIVITY)    EKG EKG Interpretation  Date/Time:  Wednesday June 01 2022 11:42:15 EDT Ventricular Rate:  77 PR Interval:  161 QRS Duration: 88 QT Interval:  370 QTC Calculation: 419 R Axis:   -20 Text Interpretation: Sinus rhythm Borderline left axis deviation Low voltage, precordial leads Abnormal R-wave progression, early transition Borderline T abnormalities, anterior leads No significant change since last tracing Confirmed by Dorie Rank [phone]) on 06/01/2022 11:44:21 AM  Radiology DG Chest Port 1 View  Result Date: 05/31/2022 CLINICAL DATA:  Shortness of breath EXAM: PORTABLE CHEST 1 VIEW COMPARISON:  05/24/2022 FINDINGS: Cardiac size is within normal limits. Thoracic aorta is tortuous and ectatic. Lung fields are clear of any infiltrates or pulmonary edema. There is no significant pleural effusion or pneumothorax. IMPRESSION: No active disease. Electronically Signed   By: Elmer Picker M.D.    On: 05/31/2022 11:21    Procedures Procedures    Medications Ordered in ED Medications  pantoprazole (PROTONIX) injection 40 mg (has no administration in time range)  sodium chloride flush (NS) 0.9 % injection 3 mL (has no administration in time range)  0.9 %  sodium chloride infusion (has no administration in time range)  acetaminophen (TYLENOL) tablet 650 mg (has no administration in time range)    Or  acetaminophen (TYLENOL) suppository 650 mg (has no administration in time range)  oxyCODONE (Oxy IR/ROXICODONE) immediate release tablet 5 mg (has no administration in time range)  HYDROmorphone (DILAUDID) injection 0.5-1 mg (has no administration in time range)  traZODone (DESYREL) tablet 25 mg (has no administration in time range)  senna-docusate (Senokot-S) tablet 1 tablet (has no administration in time range)  bisacodyl (  DULCOLAX) EC tablet 5 mg (has no administration in time range)  sodium phosphate (FLEET) 7-19 GM/118ML enema 1 enema (has no administration in time range)  ondansetron (ZOFRAN) tablet 4 mg (has no administration in time range)    Or  ondansetron (ZOFRAN) injection 4 mg (has no administration in time range)  ipratropium (ATROVENT) nebulizer solution 0.5 mg (has no administration in time range)  levalbuterol (XOPENEX) nebulizer solution 0.63 mg (has no administration in time range)  hydrALAZINE (APRESOLINE) injection 10 mg (has no administration in time range)  0.9 %  sodium chloride infusion (has no administration in time range)  atorvastatin (LIPITOR) tablet 40 mg (has no administration in time range)  ezetimibe (ZETIA) tablet 10 mg (has no administration in time range)  amitriptyline (ELAVIL) tablet 25 mg (has no administration in time range)  escitalopram (LEXAPRO) tablet 10 mg (has no administration in time range)  acetaminophen (TYLENOL) tablet 650 mg (650 mg Oral Given 06/01/22 1322)  pantoprazole (PROTONIX) injection 40 mg (40 mg Intravenous Given 06/01/22  1328)    ED Course/ Medical Decision Making/ A&P                           Medical Decision Making Patient presenting with worsening generalized weakness with a drop in her hemoglobin today from 9.5 yesterday to 8.3 today.  This could be somewhat dilutional as she received IV fluids during her visit here yesterday, however she is strongly Hemoccult positive today.  She has a history of iron deficiency anemia, there are multiple possible reasons, colonoscopy 7 years ago showed multiple polyps and a large internal hemorrhoid.  She has also had enteroscopy in the past approximately 10 years ago in which a jejunal AVM was diagnosed.  She will require admission given her symptomatic anemia, she may require iron infusions and/or PRBC transfusion.  Amount and/or Complexity of Data Reviewed Labs: ordered. Discussion of management or test interpretation with external provider(s): Patient was discussed with Dr. Gala Romney of GI, recommended PPI, Protonix has been given, also recommends clear liquid diet today, n.p.o. after midnight.  Plan for EGD tomorrow.   2:32 PM Discussed with Dr. Roger Shelter who accepts pt for admission.  1 unit prbc's ordered.  Risk Decision regarding hospitalization.   CRITICAL CARE Performed by: Evalee Jefferson Total critical care time: 45 minutes Critical care time was exclusive of separately billable procedures and treating other patients. Critical care was necessary to treat or prevent imminent or life-threatening deterioration. Critical care was time spent personally by me on the following activities: development of treatment plan with patient and/or surrogate as well as nursing, discussions with consultants, evaluation of patient's response to treatment, examination of patient, obtaining history from patient or surrogate, ordering and performing treatments and interventions, ordering and review of laboratory studies, ordering and review of radiographic studies, pulse oximetry and  re-evaluation of patient's condition.         Final Clinical Impression(s) / ED Diagnoses Final diagnoses:  Gastrointestinal hemorrhage, unspecified gastrointestinal hemorrhage type  Symptomatic anemia    Rx / DC Orders ED Discharge Orders     None         Landis Martins 06/01/22 1433    Dorie Rank, MD 06/04/22 [phone]

## 2022-06-02 ENCOUNTER — Inpatient Hospital Stay (HOSPITAL_COMMUNITY): Payer: Medicare Other | Admitting: Anesthesiology

## 2022-06-02 ENCOUNTER — Encounter (HOSPITAL_COMMUNITY)
Admission: EM | Disposition: A | Discharge status: Home / Self Care | Payer: Self-pay | Source: Home / Self Care | Attending: Family Medicine

## 2022-06-02 ENCOUNTER — Ambulatory Visit (HOSPITAL_COMMUNITY): Payer: Medicare Other

## 2022-06-02 DIAGNOSIS — E785 Hyperlipidemia, unspecified: Secondary | ICD-10-CM

## 2022-06-02 DIAGNOSIS — F53 Postpartum depression: Secondary | ICD-10-CM | POA: Diagnosis not present

## 2022-06-02 DIAGNOSIS — K5520 Angiodysplasia of colon without hemorrhage: Secondary | ICD-10-CM | POA: Diagnosis not present

## 2022-06-02 DIAGNOSIS — K219 Gastro-esophageal reflux disease without esophagitis: Secondary | ICD-10-CM

## 2022-06-02 DIAGNOSIS — K449 Diaphragmatic hernia without obstruction or gangrene: Secondary | ICD-10-CM

## 2022-06-02 DIAGNOSIS — R531 Weakness: Secondary | ICD-10-CM

## 2022-06-02 DIAGNOSIS — F32A Depression, unspecified: Secondary | ICD-10-CM

## 2022-06-02 DIAGNOSIS — D500 Iron deficiency anemia secondary to blood loss (chronic): Secondary | ICD-10-CM

## 2022-06-02 DIAGNOSIS — K31819 Angiodysplasia of stomach and duodenum without bleeding: Secondary | ICD-10-CM

## 2022-06-02 DIAGNOSIS — R195 Other fecal abnormalities: Secondary | ICD-10-CM | POA: Diagnosis not present

## 2022-06-02 DIAGNOSIS — K921 Melena: Secondary | ICD-10-CM | POA: Diagnosis not present

## 2022-06-02 DIAGNOSIS — I10 Essential (primary) hypertension: Secondary | ICD-10-CM

## 2022-06-02 DIAGNOSIS — I639 Cerebral infarction, unspecified: Secondary | ICD-10-CM

## 2022-06-02 DIAGNOSIS — Z87891 Personal history of nicotine dependence: Secondary | ICD-10-CM

## 2022-06-02 DIAGNOSIS — Q2733 Arteriovenous malformation of digestive system vessel: Secondary | ICD-10-CM

## 2022-06-02 DIAGNOSIS — I890 Lymphedema, not elsewhere classified: Secondary | ICD-10-CM

## 2022-06-02 HISTORY — PX: ESOPHAGOGASTRODUODENOSCOPY: SHX5428

## 2022-06-02 HISTORY — PX: ENTEROSCOPY: SHX5533

## 2022-06-02 HISTORY — PX: HOT HEMOSTASIS: SHX5433

## 2022-06-02 LAB — CBC
HCT: 30.5 % — ABNORMAL LOW (ref 36.0–46.0)
Hemoglobin: 9.7 g/dL — ABNORMAL LOW (ref 12.0–15.0)
MCH: 30.7 pg (ref 26.0–34.0)
MCHC: 31.8 g/dL (ref 30.0–36.0)
MCV: 96.5 fL (ref 80.0–100.0)
Platelets: 287 10*3/uL (ref 150–400)
RBC: 3.16 MIL/uL — ABNORMAL LOW (ref 3.87–5.11)
RDW: 19.8 % — ABNORMAL HIGH (ref 11.5–15.5)
WBC: 4.5 10*3/uL (ref 4.0–10.5)
nRBC: 0 % (ref 0.0–0.2)

## 2022-06-02 LAB — TYPE AND SCREEN
ABO/RH(D): A POS
Antibody Screen: NEGATIVE
Unit division: 0

## 2022-06-02 LAB — GLUCOSE, CAPILLARY: Glucose-Capillary: 90 mg/dL (ref 70–99)

## 2022-06-02 LAB — BASIC METABOLIC PANEL
Anion gap: 7 (ref 5–15)
BUN: 8 mg/dL (ref 8–23)
CO2: 23 mmol/L (ref 22–32)
Calcium: 8.9 mg/dL (ref 8.9–10.3)
Chloride: 112 mmol/L — ABNORMAL HIGH (ref 98–111)
Creatinine, Ser: 0.82 mg/dL (ref 0.44–1.00)
GFR, Estimated: >60 mL/min (ref >60)
Glucose, Bld: 81 mg/dL (ref 70–99)
Potassium: 3.6 mmol/L (ref 3.5–5.1)
Sodium: 142 mmol/L (ref 135–145)

## 2022-06-02 LAB — BPAM RBC
Blood Product Expiration Date: 202307222359
ISSUE DATE / TIME: 202307191558
Unit Type and Rh: 6200

## 2022-06-02 LAB — PROTIME-INR
INR: 1.1 (ref 0.8–1.2)
Prothrombin Time: 13.6 seconds (ref 11.4–15.2)

## 2022-06-02 LAB — APTT: aPTT: 25 seconds (ref 24–36)

## 2022-06-02 SURGERY — EGD (ESOPHAGOGASTRODUODENOSCOPY)
Anesthesia: General

## 2022-06-02 MED ORDER — LACTATED RINGERS IV SOLN: INTRAVENOUS | Status: DC | PRN

## 2022-06-02 MED ORDER — PHENYLEPHRINE 80 MCG/ML (10ML) SYRINGE FOR IV PUSH (FOR BLOOD PRESSURE SUPPORT)
PREFILLED_SYRINGE | INTRAVENOUS | Status: DC | PRN
  Administered 2022-06-02: 80 ug via INTRAVENOUS
  Administered 2022-06-02 (×2): 160 ug via INTRAVENOUS

## 2022-06-02 MED ORDER — LACTATED RINGERS IV SOLN: INTRAVENOUS | Status: DC

## 2022-06-02 MED ORDER — GLUCAGON HCL RDNA (DIAGNOSTIC) 1 MG IJ SOLR
INTRAMUSCULAR | Status: AC
  Filled 2022-06-02: qty 1

## 2022-06-02 MED ORDER — PROPOFOL 10 MG/ML IV BOLUS
INTRAVENOUS | Status: DC | PRN
  Administered 2022-06-02: 30 mg via INTRAVENOUS
  Administered 2022-06-02: 40 mg via INTRAVENOUS
  Administered 2022-06-02: 50 mg via INTRAVENOUS
  Administered 2022-06-02: 40 mg via INTRAVENOUS
  Administered 2022-06-02: 80 mg via INTRAVENOUS
  Administered 2022-06-02: 40 mg via INTRAVENOUS

## 2022-06-02 MED ORDER — SODIUM CHLORIDE 0.9 % IV SOLN: INTRAVENOUS | Status: DC

## 2022-06-02 MED ORDER — LIDOCAINE HCL (CARDIAC) PF 100 MG/5ML IV SOSY
PREFILLED_SYRINGE | INTRAVENOUS | Status: DC | PRN
  Administered 2022-06-02: 50 mg via INTRAVENOUS

## 2022-06-02 MED ORDER — GLUCAGON HCL RDNA (DIAGNOSTIC) 1 MG IJ SOLR
INTRAMUSCULAR | Status: DC | PRN
  Administered 2022-06-02: 1 mg via INTRAVENOUS

## 2022-06-02 MED ORDER — PHENYLEPHRINE 80 MCG/ML (10ML) SYRINGE FOR IV PUSH (FOR BLOOD PRESSURE SUPPORT)
PREFILLED_SYRINGE | INTRAVENOUS | Status: AC
  Filled 2022-06-02: qty 10

## 2022-06-02 NOTE — Anesthesia Preprocedure Evaluation (Signed)
Anesthesia Evaluation  Patient identified by MRN, date of birth, ID band Patient awake    Reviewed: Allergy & Precautions, NPO status , Patient's Chart, lab work & pertinent test results  Airway Mallampati: II  TM Distance: >3 FB Neck ROM: Full    Dental  (+) Upper Dentures, Lower Dentures   Pulmonary neg pulmonary ROS, former smoker,    Pulmonary exam normal breath sounds clear to auscultation       Cardiovascular Exercise Tolerance: Poor hypertension, Pt. on medications Normal cardiovascular exam Rhythm:Regular Rate:Normal  1. Left ventricular ejection fraction, by visual estimation, is 60 to 65%. The left ventricle has normal function. Normal left ventricular size.  There is moderately increased left ventricular hypertrophy.  2. Left ventricular diastolic Doppler parameters are consistent with impaired relaxation pattern of LV diastolic filling.  3. Global right ventricle has normal systolic function.The right ventricular size is normal. No increase in right ventricular wall thickness.  4. Left atrial size was normal.  5. Right atrial size was normal.  6. The mitral valve is normal in structure. No evidence of mitral valve regurgitation. No evidence of mitral stenosis.  7. The tricuspid valve is normal in structure. Tricuspid valve  regurgitation was not visualized by color flow Doppler.  8. The aortic valve has an indeterminant number of cusps Aortic valve  regurgitation was not visualized by color flow Doppler. Structurally  normal aortic valve, with no evidence of sclerosis or stenosis.  9. The pulmonic valve was not well visualized. Pulmonic valve regurgitation is not visualized by color flow Doppler.  10. The inferior vena cava IVC is small, suggesting low RA pressure and hypovolemia.  11. The interatrial septum was not well visualized.   01-Jun-2022 11:42:15 [facility]-ER ROUTINE  RECORD [id number] (81 yr) Female Black Room:ER11 Vent. rate 77 BPM PR interval 161 ms QRS duration 88 ms QT/QTcB 370/419 ms P-R-T axes 64 -20 55 Sinus rhythm Borderline left axis deviation Low voltage, precordial leads Abnormal R-wave progression, early transition Borderline T abnormalities, anterior leads No significant change since last tracing Confirmed by Dorie Rank [phone]) on 06/01/2022 11:44:21 AM   Neuro/Psych PSYCHIATRIC DISORDERS Depression  Neuromuscular disease CVA    GI/Hepatic Neg liver ROS, hiatal hernia, GERD  Medicated and Controlled,  Endo/Other  negative endocrine ROS  Renal/GU negative Renal ROS  negative genitourinary   Musculoskeletal  (+) Arthritis , Osteoarthritis,    Abdominal   Peds negative pediatric ROS (+)  Hematology  (+) Blood dyscrasia, anemia ,   Anesthesia Other Findings   Reproductive/Obstetrics negative OB ROS                          Anesthesia Physical Anesthesia Plan  ASA: 3  Anesthesia Plan: General   Post-op Pain Management: Minimal or no pain anticipated   Induction: Intravenous  PONV Risk Score and Plan: Propofol infusion  Airway Management Planned: Nasal Cannula and Natural Airway  Additional Equipment:   Intra-op Plan:   Post-operative Plan:   Informed Consent: I have reviewed the patients History and Physical, chart, labs and discussed the procedure including the risks, benefits and alternatives for the proposed anesthesia with the patient or authorized representative who has indicated his/her understanding and acceptance.       Plan Discussed with: CRNA and Surgeon  Anesthesia Plan Comments:        Anesthesia Quick Evaluation

## 2022-06-02 NOTE — Interval H&P Note (Signed)
History and Physical Interval Note:  06/02/2022 3:07 PM  DWIGHT BURDO  has presented today for surgery, with the diagnosis of symptomatic acute on chronic anemia with hx of jejunal AVMs, heme positive stool.  The various methods of treatment have been discussed with the patient and family. After consideration of risks, benefits and other options for treatment, the patient has consented to  Procedure(s) with comments: ESOPHAGOGASTRODUODENOSCOPY (EGD) (N/A) ENTEROSCOPY (N/A) - with pediatric colonoscope as a surgical intervention.  The patient's history has been reviewed, patient examined, no change in status, stable for surgery.  I have reviewed the patient's chart and labs.  Questions were answered to the patient's satisfaction.     Manus Rudd   Patient seen and examined in short stay.  Hemoglobin 9.7 this a.m. after 1 unit packed RBCs yesterday  she has remained hemodynamically stable.  I have offered the patient a EGD with push enteroscopy today.  Risk, benefits limitations have been reviewed.  Questions answered.  She is agreeable. Further recommendations after endoscopic evaluation   Has been carried out.

## 2022-06-02 NOTE — Anesthesia Postprocedure Evaluation (Signed)
Anesthesia Post Note  Patient: Melody Mitchell  Procedure(s) Performed: ESOPHAGOGASTRODUODENOSCOPY (EGD) ENTEROSCOPY HOT HEMOSTASIS (ARGON PLASMA COAGULATION/BICAP)  Patient location during evaluation: Phase II Anesthesia Type: General Level of consciousness: awake and alert and oriented Pain management: pain level controlled Vital Signs Assessment: post-procedure vital signs reviewed and stable Respiratory status: spontaneous breathing, nonlabored ventilation and respiratory function stable Cardiovascular status: blood pressure returned to baseline and stable Postop Assessment: no apparent nausea or vomiting Anesthetic complications: no   No notable events documented.   Last Vitals:  Vitals:   06/02/22 1545 06/02/22 1549  BP: 104/69   Pulse: 82 82  Resp: 14 13  Temp:    SpO2: 98% 99%    Last Pain:  Vitals:   06/02/22 1545  TempSrc:   PainSc: 0-No pain                 Kawehi Hostetter C Bonnetta Allbee

## 2022-06-02 NOTE — Op Note (Signed)
[facility] Patient Name: Melody Mitchell Procedure Date: 06/02/2022 2:58 PM MRN: [id number] Date of Birth: March 05, 1941 Attending MD: Norvel Richards , MD CSN: [id number] Age: 81 Admit Type: Inpatient Procedure:                Small bowel enteroscopy Indications:              Occult blood in stool Providers:                Norvel Richards, MD, Lambert Mody, Caprice Kluver, Kristine L. Risa Grill, Technician Referring MD:              Medicines:                Propofol per Anesthesia Complications:            No immediate complications. Estimated Blood Loss:     Estimated blood loss: none. Procedure:                Pre-Anesthesia Assessment:                           - Prior to the procedure, a History and Physical                            was performed, and patient medications and                            allergies were reviewed. The patient's tolerance of                            previous anesthesia was also reviewed. The risks                            and benefits of the procedure and the sedation                            options and risks were discussed with the patient.                            All questions were answered, and informed consent                            was obtained. Prior Anticoagulants: The patient has                            taken no previous anticoagulant or antiplatelet                            agents. ASA Grade Assessment: III - A patient with                            severe systemic disease. After reviewing the risks  and benefits, the patient was deemed in                            satisfactory condition to undergo the procedure.                           After obtaining informed consent, the endoscope was                            passed under direct vision. Throughout the                            procedure, the patient's blood pressure, pulse, and                             oxygen saturations were monitored continuously. The                            [id number]-[id number] ([id number]) scope was introduced through                            the mouth and advanced to the proximal jejunum. The                            small bowel enteroscopy was accomplished without                            difficulty. The patient tolerated the procedure                            well. Scope In: 3:17:50 PM Scope Out: 3:35:49 PM Total Procedure Duration: 0 hours 17 minutes 59 seconds  Findings:      Esophagus normal. Small hiatal hernia. Otherwise normal stomach. Patent       pylorus. Pediatric colonoscope was advanced in to the proximal jejunum.       Patient had scattered lymph angiectatic lesions. Patient also had (8)       AVMs scattered from the mid duodenum into the proximal jejunum. Largest       lesion 5 mm. These were not actively bleeding when assessed. Utilizing       the APC circular probe on small bowel settings, all 8 AVMs were sealed       thermally. Good hemostasis was maintained throughout this maneuver. Impression:               - Normal esophagus. Small hiatal hernia. Multiple                            small bowel AVMs - ablated as described                           -Small bowel lymphangiectasia Moderate Sedation:      Moderate (conscious) sedation was personally administered by an       anesthesia professional. The following parameters were monitored: oxygen       saturation, heart rate, blood pressure, respiratory rate, EKG, adequacy  of pulmonary ventilation, and response to care. Recommendation:           - Return patient to hospital ward for observation.                            Clear liquid diet. Trend H&H. For the time being,                            would hold off on pursuing a colonoscopy so long as                            H&H: Stable.                           -At patient request, I called Vertell Novak at                             [phone] -reviewed findings and recommendations. Procedure Code(s):        --- Professional ---                           [phone], Small intestinal endoscopy, enteroscopy                            beyond second portion of duodenum, not including                            ileum; diagnostic, including collection of                            specimen(s) by brushing or washing, when performed                            (separate procedure) Diagnosis Code(s):        --- Professional ---                           R19.5, Other fecal abnormalities CPT copyright 2019 American Medical Association. All rights reserved. The codes documented in this report are preliminary and upon coder review may  be revised to meet current compliance requirements. Cristopher Estimable. Vici Novick, MD Norvel Richards, MD 06/02/2022 4:07:38 PM This report has been signed electronically. Number of Addenda: 0

## 2022-06-02 NOTE — Transfer of Care (Signed)
Immediate Anesthesia Transfer of Care Note  Patient: Melody Mitchell  Procedure(s) Performed: ESOPHAGOGASTRODUODENOSCOPY (EGD) ENTEROSCOPY HOT HEMOSTASIS (ARGON PLASMA COAGULATION/BICAP)  Patient Location: PACU  Anesthesia Type:General  Level of Consciousness: drowsy  Airway & Oxygen Therapy: Patient Spontanous Breathing and Patient connected to nasal cannula oxygen  Post-op Assessment: Report given to RN and Post -op Vital signs reviewed and stable  Post vital signs: Reviewed and stable  Last Vitals:  Vitals Value Taken Time  BP    Temp    Pulse    Resp    SpO2      Last Pain:  Vitals:   06/02/22 1513  TempSrc:   PainSc: 0-No pain      Patients Stated Pain Goal: 6 (40/97/35 3299)  Complications: No notable events documented.

## 2022-06-02 NOTE — Care Management Important Message (Signed)
Important Message  Patient Details  Name: Melody Mitchell MRN: [id number] Date of Birth: 12-01-1940   Medicare Important Message Given:  N/A - LOS <3 / Initial given by admissions     Dannette Barbara 06/02/2022, 5:21 PM

## 2022-06-02 NOTE — Evaluation (Signed)
Physical Therapy Evaluation Patient Details Name: Melody Mitchell MRN: [id number] DOB: February 18, 1941 Today's Date: 06/02/2022  History of Present Illness  ASIYA CUTBIRTH is a 81 year old female with history of iron deficiency anemia, hemorrhoids, AVMs, GERD, hypokalemia, hypertension, hyperlipidemia, depression, presented to the ED with chief complaint of progressive acute on chronic fatigue and weakness.  She denies of any falls, but she has been progressively getting weaker.  She lives alone therefore she is more concerned.   Clinical Impression  Patient sitting in chair upon therapist arrival and agreeable to participating in PT evaluation today. Upon initial eval trial, patient complained of nausea upon standing and nursing entered room with concern for bradycardia. PT returned later in afternoon and patient agreeable to going for a walk. Patient transferred with supervision to min guard for safety in unfamiliar environment and that patient complained of nausea prior to standing. Patient demonstrated minimal weak/unsteady standing without assistive device and required cues for sequencing of steps and placement of hands with RW but more steady and safe. Patient ambulated using a slow, labored cadence with SPC and RW but more steady balance with RW. Patient limited by fatigue and complaints of headache during ambulation. Patient on room air throughout.     Recommendations for follow up therapy are one component of a multi-disciplinary discharge planning process, led by the attending physician.  Recommendations may be updated based on patient status, additional functional criteria and insurance authorization.  Follow Up Recommendations Home health PT      Assistance Recommended at Discharge Set up Supervision/Assistance  Patient can return home with the following  A little help with walking and/or transfers;A little help with bathing/dressing/bathroom;Assistance with cooking/housework;Assist for  transportation;Help with stairs or ramp for entrance    Equipment Recommendations None recommended by PT  Recommendations for Other Services       Functional Status Assessment Patient has had a recent decline in their functional status and demonstrates the ability to make significant improvements in function in a reasonable and predictable amount of time.     Precautions / Restrictions Precautions Precautions: Fall Precaution Comments: patient reports no falls in the last six months Restrictions Weight Bearing Restrictions: No      Mobility  Bed Mobility   General bed mobility comments: patient seated in chair at beginning of session    Transfers Overall transfer level: Needs assistance Equipment used: Straight cane, Rolling walker (2 wheels) Transfers: Sit to/from Stand, Bed to chair/wheelchair/BSC Sit to Stand: Supervision, Min guard   Step pivot transfers: Min guard, Min assist   Anterior-Posterior transfers: Modified independent (Device/Increase time)   General transfer comment: supervision for safety in unfamiliar environment and that patient complained of nausea prior to standing; minimal weak/unsteady standing without assistive device; cues for sequencing of steps and placement of hands with RW but more steady    Ambulation/Gait Ambulation/Gait assistance: Min guard, Min assist Gait Distance (Feet): 100 Feet Assistive device: Rolling walker (2 wheels), Straight cane Gait Pattern/deviations: Step-through pattern, Decreased step length - right, Decreased step length - left, Decreased stride length, Trunk flexed, Wide base of support Gait velocity: decreased     General Gait Details: slow, labored cadence with SPC and RW but more steady balance with RW; limited by fatigue and complaints of headache; on room air throughout  Stairs    Wheelchair Mobility    Modified Rankin (Stroke Patients Only)       Balance Overall balance assessment: Needs  assistance Sitting-balance support: No upper extremity supported, Feet supported  Sitting balance-Leahy Scale: Good Sitting balance - Comments: seated in chair   Standing balance support: Single extremity supported, During functional activity, Reliant on assistive device for balance Standing balance-Leahy Scale: Fair Standing balance comment: fair/poor with SPC; fair with RW       Pertinent Vitals/Pain Pain Assessment Pain Assessment: No/denies pain    Home Living Family/patient expects to be discharged to:: Private residence Living Arrangements: Alone Available Help at Discharge: Family;Friend(s);Available PRN/intermittently Type of Home: Apartment Home Access: Level entry       Home Layout: One level Home Equipment: Conservation officer, nature (2 wheels);Cane - single point;Grab bars - tub/shower      Prior Function Prior Level of Function : Needs assist  = ADLs Comments: sister or friends would drive her to grocery store about once/week     Hand Dominance        Extremity/Trunk Assessment   Upper Extremity Assessment Upper Extremity Assessment: Defer to OT evaluation    Lower Extremity Assessment Lower Extremity Assessment: Overall WFL for tasks assessed    Cervical / Trunk Assessment Cervical / Trunk Assessment: Normal  Communication   Communication: No difficulties  Cognition Arousal/Alertness: Awake/alert Behavior During Therapy: WFL for tasks assessed/performed Overall Cognitive Status: Within Functional Limits for tasks assessed        General Comments      Exercises     Assessment/Plan    PT Assessment Patient needs continued PT services  PT Problem List Decreased strength;Decreased mobility;Decreased activity tolerance;Decreased balance;Decreased knowledge of use of DME       PT Treatment Interventions DME instruction;Therapeutic exercise;Gait training;Balance training;Neuromuscular re-education;Functional mobility training;Therapeutic  activities;Patient/family education    PT Goals (Current goals can be found in the Care Plan section)  Acute Rehab PT Goals Patient Stated Goal: Go home PT Goal Formulation: With patient Time For Goal Achievement: 06/16/22 Potential to Achieve Goals: Fair    Frequency Min 3X/week        AM-PAC PT "6 Clicks" Mobility  Outcome Measure Help needed turning from your back to your side while in a flat bed without using bedrails?: None Help needed moving from lying on your back to sitting on the side of a flat bed without using bedrails?: A Little Help needed moving to and from a bed to a chair (including a wheelchair)?: A Little Help needed standing up from a chair using your arms (e.g., wheelchair or bedside chair)?: A Little Help needed to walk in hospital room?: A Little Help needed climbing 3-5 steps with a railing? : A Little 6 Click Score: 19    End of Session Equipment Utilized During Treatment: Gait belt Activity Tolerance: Patient tolerated treatment well;Patient limited by fatigue Patient left: with nursing/sitter in room (op nurse tranport came to get patient for EGD at end of session) Nurse Communication: Mobility status PT Visit Diagnosis: Unsteadiness on feet (R26.81);Other abnormalities of gait and mobility (R26.89);Muscle weakness (generalized) (M62.81)    Time: 4287-6811 PT Time Calculation (min) (ACUTE ONLY): 25 min   Charges:   PT Evaluation $PT Eval Low Complexity: 1 Low PT Treatments $Therapeutic Activity: 8-22 mins        Floria Raveling. Hartnett-Rands, MS, PT Per [place] [phone]  Pamala Hurry  Hartnett-Rands 06/02/2022, 2:41 PM

## 2022-06-02 NOTE — Progress Notes (Signed)
  Transition of Care [facility]) Screening Note   Patient Details  Name: Melody Mitchell Date of Birth: [birth year]   Transition of Care [facility]) CM/SW Contact:    Ihor Gully, LCSW Phone Number: 06/02/2022, 12:04 PM    Transition of Care Department [facility]) has reviewed patient and no TOC needs have been identified at this time. We will continue to monitor patient advancement through interdisciplinary progression rounds. If new patient transition needs arise, please place a TOC consult.

## 2022-06-02 NOTE — Progress Notes (Signed)
PROGRESS NOTE    Patient: Melody Mitchell                            PCP: [name], MD                    DOB: Jul 03, 1941            DOA: 06/01/2022 FIE:[id number]             DOS: 06/02/2022, 12:32 PM   LOS: 1 day   Date of Service: The patient was seen and examined on 06/02/2022  Subjective:   The patient was seen and examined this morning. Hemodynamically stable, no issues overnight Tolerated blood transfusion well Improved, but still complaining of generalized weaknesses  Brief Narrative:   Melody Mitchell is a 81 year old female with history of iron deficiency anemia, hemorrhoids, AVMs, GERD, hypokalemia, hypertension, hyperlipidemia, depression, presented to the ED with chief complaint of progressive acute on chronic fatigue and weakness. She denies of any falls, but she has been progressively getting weaker.  She lives alone therefore she is more concerned     ED: Blood pressure 123/78, pulse 71, temperature 98.7 F (37.1 C), temperature source Oral, resp. rate 16, SpO2 97 %.    Latest Ref Rng & Units 06/01/2022   10:36 AM 05/31/2022   11:28 AM 05/28/2022    1:19 PM  CBC  WBC 4.0 - 10.5 K/uL 4.9  5.6  5.4   Hemoglobin 12.0 - 15.0 g/dL 8.3  9.1  9.3   Hematocrit 36.0 - 46.0 % 27.7  29.7  29.6   Platelets 150 - 400 K/uL 273  307  290   B12 958  EKG normal sinus rhythm negative any ST elevation depression  Hemoccult positive Also reporting of some unintentional weight loss Iron studies total iron 35 TIBC 250, ferritin 85, folate 6.4 UA within normal limits Chest x-ray within normal limits      Assessment & Plan:   Principal Problem:   GIB (gastrointestinal bleeding) Active Problems:   Weakness   Dark stools   Arteriovenous malformation of jejunum   GERD   Depression   Iron deficiency anemia due to chronic blood loss   Hyperlipidemia   Heme positive stool   Stroke (cerebrum) (HCC)     Assessment and Plan: * GIB (gastrointestinal bleeding) -  Hemoccult positive  - steady drop in H&H noted -This post 1 unit PRBC transfusion, hemoglobin 8.3 >> 9.7 today -IV Protonix -Consulted, n.p.o.  Anticipating upper endoscopy on 06/02/2022  Weakness - Consulting PT/OT for evaluation recommendation -Cause including blood loss anemia  Depression - Currently stable, resuming home medications: Elavil, Lexapro  GERD - Continue Protonix 40 mg IV twice daily  Arteriovenous malformation of jejunum On PPI, planning for endoscopy per GI  Heme positive stool - Monitoring H&H -Consulted GI, planning for endoscopy on 06/02/2022 -NPO after midnight  Hyperlipidemia - Continue statins  Iron deficiency anemia due to chronic blood loss Monitoring H&H closely, -Baseline hemoglobin around 10, down to 8.3 >> 9.7 -S/P 1U PRBC transfusion 06/01/2022 Tolerated blood transfusion well stable Iron/TIBC/Ferritin/ %Sat    Component Value Date/Time   IRON 35 06/01/2022 1039   TIBC 259 06/01/2022 1039   FERRITIN 85 06/01/2022 1039   IRONPCTSAT 14 06/01/2022 1039          ----------------------------------------------------------------------------------------------------------------------------------------  DVT prophylaxis:  Place and maintain sequential compression device Start: 06/01/22 1431 TED hose Start: 06/01/22  68 SCDs Start: 06/01/22 1426   Code Status:   Code Status: Full Code  Family Communication: No family member present at bedside- attempt will be made to update daily The above findings and plan of care has been discussed with patient (and family)  in detail,  they expressed understanding and agreement of above. -Advance care planning has been discussed.   Admission status:   Status is: Inpatient Remains inpatient appropriate because: Needing GI evaluation possible endoscopy today     Procedures:   No admission procedures for hospital encounter.   Antimicrobials:  Anti-infectives (From admission, onward)     None        Medication:   amitriptyline  25 mg Oral QHS   atorvastatin  40 mg Oral Daily   escitalopram  10 mg Oral Daily   ezetimibe  10 mg Oral q AM   pantoprazole (PROTONIX) IV  40 mg Intravenous Q12H   sodium chloride flush  3 mL Intravenous Q12H    acetaminophen **OR** acetaminophen, bisacodyl, hydrALAZINE, HYDROmorphone (DILAUDID) injection, ipratropium, levalbuterol, ondansetron **OR** ondansetron (ZOFRAN) IV, oxyCODONE, senna-docusate, sodium phosphate, traZODone   Objective:   Vitals:   06/01/22 1945 06/02/22 0159 06/02/22 0652 06/02/22 0700  BP: 116/79 115/78 125/79   Pulse: 63 70 80   Resp: '20 16 16   '$ Temp: 98.6 F (37 C) 98.1 F (36.7 C) 98.3 F (36.8 C)   TempSrc:      SpO2: 97% 96% 96%   Weight:    62.1 kg  Height:        Intake/Output Summary (Last 24 hours) at 06/02/2022 1232 Last data filed at 06/01/2022 1843 Gross per 24 hour  Intake 945 ml  Output --  Net 945 ml   Filed Weights   06/01/22 1846 06/02/22 0700  Weight: 63.8 kg 62.1 kg     Examination:   Physical Exam  Constitution:  Alert, cooperative, no distress,  Appears calm and comfortable  Psychiatric:   Normal and stable mood and affect, cognition intact,   HEENT:        Normocephalic, PERRL, otherwise with in Normal limits  Chest:         Chest symmetric Cardio vascular:  S1/S2, RRR, No murmure, No Rubs or Gallops  pulmonary: Clear to auscultation bilaterally, respirations unlabored, negative wheezes / crackles Abdomen: Soft, non-tender, non-distended, bowel sounds,no masses, no organomegaly Muscular skeletal: Limited exam - in bed, able to move all 4 extremities,   Neuro: CNII-XII intact. , normal motor and sensation, reflexes intact  Extremities: No pitting edema lower extremities, +2 pulses  Skin: Dry, warm to touch, negative for any Rashes, No open wounds Wounds: per nursing  documentation   ------------------------------------------------------------------------------------------------------------------------------------------    LABs:     Latest Ref Rng & Units 06/02/2022    5:18 AM 06/01/2022   10:36 AM 05/31/2022   11:28 AM  CBC  WBC 4.0 - 10.5 K/uL 4.5  4.9  5.6   Hemoglobin 12.0 - 15.0 g/dL 9.7  8.3  9.1   Hematocrit 36.0 - 46.0 % 30.5  27.7  29.7   Platelets 150 - 400 K/uL 287  273  307       Latest Ref Rng & Units 06/02/2022    5:18 AM 06/01/2022   10:36 AM 05/31/2022   11:28 AM  CMP  Glucose 70 - 99 mg/dL 81  87  89   BUN 8 - 23 mg/dL '8  10  8   '$ Creatinine 0.44 - 1.00 mg/dL 0.82  0.86  0.82   Sodium 135 - 145 mmol/L 142  140  140   Potassium 3.5 - 5.1 mmol/L 3.6  3.7  4.0   Chloride 98 - 111 mmol/L 112  109  108   CO2 22 - 32 mmol/L '23  25  26   '$ Calcium 8.9 - 10.3 mg/dL 8.9  9.0  9.0   Total Protein 6.5 - 8.1 g/dL  6.4  6.9   Total Bilirubin 0.3 - 1.2 mg/dL  0.2  0.3   Alkaline Phos 38 - 126 U/L  46  56   AST 15 - 41 U/L  18  20   ALT 0 - 44 U/L  12  13        Micro Results Recent Results (from the past 240 hour(s))  SARS Coronavirus 2 by RT PCR (hospital order, performed in [facility] hospital lab) *cepheid single result test* Anterior Nasal Swab     Status: None   Collection Time: 05/27/22 10:47 AM   Specimen: Anterior Nasal Swab  Result Value Ref Range Status   SARS Coronavirus 2 by RT PCR NEGATIVE NEGATIVE Final    Comment: (NOTE) SARS-CoV-2 target nucleic acids are NOT DETECTED.  The SARS-CoV-2 RNA is generally detectable in upper and lower respiratory specimens during the acute phase of infection. The lowest concentration of SARS-CoV-2 viral copies this assay can detect is 250 copies / mL. A negative result does not preclude SARS-CoV-2 infection and should not be used as the sole basis for treatment or other patient management decisions.  A negative result may occur with improper specimen collection / handling,  submission of specimen other than nasopharyngeal swab, presence of viral mutation(s) within the areas targeted by this assay, and inadequate number of viral copies (<250 copies / mL). A negative result must be combined with clinical observations, patient history, and epidemiological information.  Fact Sheet for Patients:   https://www.patel.info/  Fact Sheet for Healthcare Providers: https://hall.com/  This test is not yet approved or  cleared by the [place] FDA and has been authorized for detection and/or diagnosis of SARS-CoV-2 by FDA under an Emergency Use Authorization (EUA).  This EUA will remain in effect (meaning this test can be used) for the duration of the COVID-19 declaration under Section 564(b)(1) of the Act, 21 U.S.C. section 360bbb-3(b)(1), unless the authorization is terminated or revoked sooner.  Performed at [facility], [address] [place], [place] [address]     Radiology Reports No results found.  SIGNED: Deatra James, MD, FHM. [facility],  Pager (please use [email or web address] to page/text) Please use Epic Secure Chat for non-urgent communication (7AM-7PM)  If 7PM-7AM, please contact night-coverage [email or web address], 06/02/2022, 12:32 PM

## 2022-06-02 NOTE — Anesthesia Procedure Notes (Signed)
Date/Time: 06/02/2022 3:26 PM  Performed by: Orlie Dakin, CRNAPre-anesthesia Checklist: Patient identified, Emergency Drugs available, Suction available and Patient being monitored Patient Re-evaluated:Patient Re-evaluated prior to induction Oxygen Delivery Method: Nasal cannula Induction Type: IV induction Placement Confirmation: positive ETCO2

## 2022-06-02 NOTE — Plan of Care (Signed)
  Problem: Acute Rehab PT Goals(only PT should resolve) Goal: Patient Will Transfer Sit To/From Stand Outcome: Progressing Flowsheets (Taken 06/02/2022 1446) Patient will transfer sit to/from stand: with supervision Goal: Pt Will Transfer Bed To Chair/Chair To Bed Outcome: Progressing Flowsheets (Taken 06/02/2022 1446) Pt will Transfer Bed to Chair/Chair to Bed: with supervision Goal: Pt Will Perform Standing Balance Or Pre-Gait Outcome: Progressing Flowsheets (Taken 06/02/2022 1446) Pt will perform standing balance or pre-gait:  1-2 min  with no UE support Goal: Pt Will Ambulate 06/02/2022 1447 by Hartnett-Rands, Pamala Hurry, PT Flowsheets (Taken 06/02/2022 1447) Pt will Ambulate: with min guard assist 06/02/2022 1447 by Hartnett-Rands, Pamala Hurry, PT Outcome: Progressing Flowsheets (Taken 06/02/2022 1446) Pt will Ambulate:  > 125 feet  with least restrictive assistive device   Pamala Hurry D. Hartnett-Rands, MS, PT Per [place] [phone] 06/02/2022

## 2022-06-03 ENCOUNTER — Telehealth: Payer: Self-pay | Admitting: Gastroenterology

## 2022-06-03 ENCOUNTER — Other Ambulatory Visit: Payer: Self-pay | Admitting: *Deleted

## 2022-06-03 DIAGNOSIS — R195 Other fecal abnormalities: Secondary | ICD-10-CM | POA: Diagnosis not present

## 2022-06-03 DIAGNOSIS — K5520 Angiodysplasia of colon without hemorrhage: Secondary | ICD-10-CM | POA: Diagnosis not present

## 2022-06-03 DIAGNOSIS — D649 Anemia, unspecified: Secondary | ICD-10-CM

## 2022-06-03 DIAGNOSIS — F53 Postpartum depression: Secondary | ICD-10-CM | POA: Diagnosis not present

## 2022-06-03 DIAGNOSIS — K922 Gastrointestinal hemorrhage, unspecified: Secondary | ICD-10-CM

## 2022-06-03 LAB — GLUCOSE, CAPILLARY: Glucose-Capillary: 76 mg/dL (ref 70–99)

## 2022-06-03 LAB — CBC
HCT: 30.3 % — ABNORMAL LOW (ref 36.0–46.0)
Hemoglobin: 9.5 g/dL — ABNORMAL LOW (ref 12.0–15.0)
MCH: 30.5 pg (ref 26.0–34.0)
MCHC: 31.4 g/dL (ref 30.0–36.0)
MCV: 97.4 fL (ref 80.0–100.0)
Platelets: 278 10*3/uL (ref 150–400)
RBC: 3.11 MIL/uL — ABNORMAL LOW (ref 3.87–5.11)
RDW: 18.7 % — ABNORMAL HIGH (ref 11.5–15.5)
WBC: 6.6 10*3/uL (ref 4.0–10.5)
nRBC: 0 % (ref 0.0–0.2)

## 2022-06-03 LAB — BASIC METABOLIC PANEL
Anion gap: 7 (ref 5–15)
BUN: 9 mg/dL (ref 8–23)
CO2: 26 mmol/L (ref 22–32)
Calcium: 8.8 mg/dL — ABNORMAL LOW (ref 8.9–10.3)
Chloride: 108 mmol/L (ref 98–111)
Creatinine, Ser: 0.78 mg/dL (ref 0.44–1.00)
GFR, Estimated: >60 mL/min (ref >60)
Glucose, Bld: 77 mg/dL (ref 70–99)
Potassium: 3.4 mmol/L — ABNORMAL LOW (ref 3.5–5.1)
Sodium: 141 mmol/L (ref 135–145)

## 2022-06-03 MED ORDER — PANTOPRAZOLE SODIUM 40 MG PO TBEC: 40.0000 mg | DELAYED_RELEASE_TABLET | Freq: Two times a day (BID) | ORAL | 0 refills | Status: DC

## 2022-06-03 MED ORDER — PANTOPRAZOLE SODIUM 40 MG PO TBEC
40.0000 mg | DELAYED_RELEASE_TABLET | Freq: Two times a day (BID) | ORAL | Status: DC
  Administered 2022-06-03: 40 mg via ORAL
  Filled 2022-06-03: qty 1

## 2022-06-03 MED ORDER — POTASSIUM CHLORIDE CRYS ER 20 MEQ PO TBCR
40.0000 meq | EXTENDED_RELEASE_TABLET | Freq: Once | ORAL | Status: AC
  Administered 2022-06-03: 40 meq via ORAL
  Filled 2022-06-03: qty 2

## 2022-06-03 NOTE — Evaluation (Signed)
Occupational Therapy Evaluation Patient Details Name: Melody Mitchell MRN: [id number] DOB: Dec 14, 1940 Today's Date: 06/03/2022   History of Present Illness Melody Mitchell is a 81 year old female with history of iron deficiency anemia, hemorrhoids, AVMs, GERD, hypokalemia, hypertension, hyperlipidemia, depression, presented to the ED with chief complaint of progressive acute on chronic fatigue and weakness.  She denies of any falls, but she has been progressively getting weaker.  She lives alone therefore she is more concerned.   Clinical Impression   Pt agreeable to OT evaluation. Pt reports that she lives alone and has a sister and friends available for help PRN. She stated that she previously bathed and dressed independently. She no longer drives, her sister/friends take her to the store as needed. During evaluation, pt completed sit to stand t/f with supervision + single point cane, she was able to ambulate in hall with single point cane and supervision- slow labored movement noted. Pt would benefit from continued OT services while in acute care setting. Discharge recommendations are below.      Recommendations for follow up therapy are one component of a multi-disciplinary discharge planning process, led by the attending physician.  Recommendations may be updated based on patient status, additional functional criteria and insurance authorization.   Follow Up Recommendations  Home health OT    Assistance Recommended at Discharge PRN  Patient can return home with the following Assistance with cooking/housework;A little help with bathing/dressing/bathroom    Functional Status Assessment  Patient has had a recent decline in their functional status and demonstrates the ability to make significant improvements in function in a reasonable and predictable amount of time.  Equipment Recommendations  BSC/3in1    Recommendations for Other Services       Precautions / Restrictions  Precautions Precautions: Fall Restrictions Weight Bearing Restrictions: No      Mobility Bed Mobility Overal bed mobility: Modified Independent             General bed mobility comments: HOB raised    Transfers Overall transfer level: Modified independent Equipment used: Straight cane Transfers: Sit to/from Stand, Bed to chair/wheelchair/BSC Sit to Stand: Supervision     Step pivot transfers: Supervision     General transfer comment: Supervision for safety and due to patient reports of dizziness. Used cane for transfers      Balance Overall balance assessment: Modified Independent (supervision) Sitting-balance support: No upper extremity supported, Feet supported Sitting balance-Leahy Scale: Good Sitting balance - Comments: sitting on EOB demonstrating good balance   Standing balance support: Reliant on assistive device for balance, Single extremity supported                               ADL either performed or assessed with clinical judgement   ADL Overall ADL's : At baseline                                             Vision Baseline Vision/History: 1 Wears glasses Ability to See in Adequate Light: 0 Adequate Patient Visual Report: No change from baseline Vision Assessment?: No apparent visual deficits                Pertinent Vitals/Pain Pain Assessment Pain Assessment: No/denies pain     Hand Dominance Right   Extremity/Trunk Assessment Upper Extremity Assessment Upper Extremity Assessment:  Overall [facility] for tasks assessed   Lower Extremity Assessment Lower Extremity Assessment: Defer to PT evaluation   Cervical / Trunk Assessment Cervical / Trunk Assessment: Normal   Communication Communication Communication: No difficulties   Cognition Arousal/Alertness: Awake/alert Behavior During Therapy: WFL for tasks assessed/performed Overall Cognitive Status: Within Functional Limits for tasks assessed                                              Exercises     Shoulder Instructions      Home Living Family/patient expects to be discharged to:: Private residence Living Arrangements: Alone Available Help at Discharge: Family;Friend(s);Available PRN/intermittently Type of Home: Apartment Home Access: Level entry     Home Layout: One level     Bathroom Shower/Tub: Teacher, early years/pre: Standard Bathroom Accessibility: Yes How Accessible: Accessible via walker Home Equipment: Rolling Walker (2 wheels);Cane - single point;Grab bars - tub/shower          Prior Functioning/Environment Prior Level of Function : Independent/Modified Independent (pt reports she was previously independent in all ADLs, she does not drive.)             Mobility Comments: Uses a single point cane for mobilty ADLs Comments: sister/friends drive her to store for groceries.                      OT Goals(Current goals can be found in the care plan section) Acute Rehab OT Goals Patient Stated Goal: to go home OT Goal Formulation: With patient Time For Goal Achievement: 06/17/22 Potential to Achieve Goals: Good ADL Goals Pt Will Perform Lower Body Bathing: with supervision Pt Will Perform Upper Body Dressing: sitting Pt Will Transfer to Toilet: with supervision  OT Frequency:      Co-evaluation              AM-PAC OT "6 Clicks" Daily Activity     Outcome Measure Help from another person eating meals?: None Help from another person taking care of personal grooming?: A Little Help from another person toileting, which includes using toliet, bedpan, or urinal?: A Little Help from another person bathing (including washing, rinsing, drying)?: A Little Help from another person to put on and taking off regular upper body clothing?: None Help from another person to put on and taking off regular lower body clothing?: A Little 6 Click Score: 20   End of Session  Equipment Utilized During Treatment:  (single poin cane) Nurse Communication: Mobility status  Activity Tolerance: Patient tolerated treatment well Patient left: in bed;with call bell/phone within reach  OT Visit Diagnosis: Muscle weakness (generalized) (M62.81)                Time: 7001-7494 OT Time Calculation (min): 30 min Charges:  OT General Charges $OT Visit: 1 Visit OT Evaluation $OT Eval Low Complexity: 1 Low OT Treatments $Self Care/Home Management : 8-22 mins  Frederic Jericho, OTR/L  06/03/2022, 9:37 AM

## 2022-06-03 NOTE — Telephone Encounter (Signed)
Noted. Labs entered into Epic  °

## 2022-06-03 NOTE — Progress Notes (Signed)
Patient referred to [place], [facility], [facility], [place], [place], [place], [place], [place], and patient was not accepted either due to service area, insurance type, or services not available.     Kaymon Denomme, Clydene Pugh, LCSW

## 2022-06-03 NOTE — Telephone Encounter (Signed)
Patient seen in the hospital for acute on chronic anemia in the setting of small bowel AVM.   Patient needs CBC in 1 week and hospital f/u in 3-4 weeks to discuss outpatient colonoscopy.   Venetia Night, MSN, FNP-BC, AGACNP-BC [facility]

## 2022-06-03 NOTE — Plan of Care (Signed)
  Problem: Acute Rehab OT Goals (only OT should resolve) Goal: Pt. Will Perform Lower Body Bathing Flowsheets (Taken 06/03/2022 0930) Pt Will Perform Lower Body Bathing: with supervision Goal: Pt. Will Perform Upper Body Dressing Flowsheets (Taken 06/03/2022 0930) Pt Will Perform Upper Body Dressing: sitting Goal: Pt. Will Transfer To Toilet Flowsheets (Taken 06/03/2022 0930) Pt Will Transfer to Toilet: with supervision  Arvil Persons, OTR/L

## 2022-06-03 NOTE — Assessment & Plan Note (Signed)
Improved

## 2022-06-03 NOTE — Discharge Summary (Signed)
Physician Discharge Summary   Patient: Melody Mitchell MRN: [id number] DOB: 13-Apr-1941  Admit date:     06/01/2022  Discharge date: 06/03/22  Discharge Physician: Deatra James   PCP: [name], MD   Recommendations at discharge:  Follow-up with a gastroenterologist in 2-4 weeks, for further evaluation possible outpatient evaluation for colonoscopy CBC within 1 week results to PCP Follow-up with a PCP within 1 week Continue home health PT OT, fall precaution,  Discharge Diagnoses: Principal Problem:   GIB (gastrointestinal bleeding) Active Problems:   Weakness   Dark stools   Arteriovenous malformation of jejunum   GERD   Depression   Iron deficiency anemia due to chronic blood loss   Hyperlipidemia   Heme positive stool   Stroke (cerebrum) ([place])  Resolved Problems:   * No resolved hospital problems. *  Hospital Course: Melody Mitchell is a 81 year old female with history of iron deficiency anemia, hemorrhoids, AVMs, GERD, hypokalemia, hypertension, hyperlipidemia, depression, presented to the ED with chief complaint of progressive acute on chronic fatigue and weakness. She denies of any falls, but she has been progressively getting weaker.  She lives alone therefore she is more concerned     ED: Blood pressure 123/78, pulse 71, temperature 98.7 F (37.1 C), temperature source Oral, resp. rate 16, SpO2 97 %.    Latest Ref Rng & Units 06/01/2022   10:36 AM 05/31/2022   11:28 AM 05/28/2022    1:19 PM  CBC  WBC 4.0 - 10.5 K/uL 4.9  5.6  5.4   Hemoglobin 12.0 - 15.0 g/dL 8.3  9.1  9.3   Hematocrit 36.0 - 46.0 % 27.7  29.7  29.6   Platelets 150 - 400 K/uL 273  307  290   B12 958  EKG normal sinus rhythm negative any ST elevation depression  Hemoccult positive Also reporting of some unintentional weight loss Iron studies total iron 35 TIBC 250, ferritin 85, folate 6.4 UA within normal limits Chest x-ray within normal limits    Assessment and Plan: * GIB  (gastrointestinal bleeding) - Hemoccult positive  - steady drop in H&H noted -This post 1 unit PRBC transfusion, hemoglobin 8.3 >> 9.7 today -IV Protonix >> switch to p.o. 40 mg twice daily per GI recommendations  Post upper endoscopy, EGD 06/02/2022 Normal esophagus. Small hiatal hernia. Multiple small bowel AVMs total 8 which was cauterized and stabilized- ablated as -Small bowel lymphangiectasia  -Recommended colonoscopy as an outpatient  Weakness - Consulting PT/OT for evaluation recommendation-recommended home health -Cause including blood loss anemia -More stable, home health PT OT has been arranged  Dark stools - Improved  Depression - Currently stable, resuming home medications: Elavil, Lexapro  GERD - Continue Protonix 40 mg IV twice daily Switch to p.o. twice daily  Arteriovenous malformation of jejunum On PPI, planning for endoscopy per GI EGD confirmed multiple AVMs x8   Heme positive stool - Monitoring H&H -Consulted GI, planning for endoscopy on 06/02/2022 -Hemoglobin remained stable  Hyperlipidemia - Continue statins  Iron deficiency anemia due to chronic blood loss Monitoring H&H closely, -Baseline hemoglobin around 10, down to 8.3 >> 9.7 -S/P 1U PRBC transfusion 06/01/2022 Tolerated blood transfusion well stable Iron/TIBC/Ferritin/ %Sat    Component Value Date/Time   IRON 35 06/01/2022 1039   TIBC 259 06/01/2022 1039   FERRITIN 85 06/01/2022 1039   IRONPCTSAT 14 06/01/2022 1039            Consultants: Gastroenterologist Procedures performed: EGD 06/02/2022 Disposition: Home health Diet  recommendation:  Discharge Diet Orders (From admission, onward)     Start     Ordered   06/03/22 0000  Diet - low sodium heart healthy        06/03/22 0931           Regular diet DISCHARGE MEDICATION: Allergies as of 06/03/2022       Reactions   Latex Hives, Itching, Rash   Sulfa Antibiotics Hives, Itching   Other reaction(s): hives   Other  Hives   Pravastatin Sodium Other (See Comments)        Medication List     STOP taking these medications    esomeprazole 40 MG capsule Commonly known as: NEXIUM       TAKE these medications    amitriptyline 25 MG tablet Commonly known as: ELAVIL Take 25 mg by mouth at bedtime.   atorvastatin 40 MG tablet Commonly known as: LIPITOR Take 40 mg by mouth daily.   escitalopram 10 MG tablet Commonly known as: LEXAPRO Take 10 mg by mouth daily.   ezetimibe 10 MG tablet Commonly known as: ZETIA Take 10 mg by mouth in the morning.   pantoprazole 40 MG tablet Commonly known as: PROTONIX Take 1 tablet (40 mg total) by mouth 2 (two) times daily.   potassium chloride SA 20 MEQ tablet Commonly known as: KLOR-CON M Take 20 mEq by mouth 2 (two) times daily.        Discharge Exam: Filed Weights   06/01/22 1846 06/02/22 0700 06/03/22 0456  Weight: 63.8 kg 62.1 kg 62.3 kg      Physical Exam:   General:  AAO x 3,  cooperative, no distress;   HEENT:  Normocephalic, PERRL, otherwise with in Normal limits   Neuro:  CNII-XII intact. , normal motor and sensation, reflexes intact   Lungs:   Clear to auscultation BL, Respirations unlabored,  No wheezes / crackles  Cardio:    S1/S2, RRR, No murmure, No Rubs or Gallops   Abdomen:  Soft, non-tender, bowel sounds active all four quadrants, no guarding or peritoneal signs.  Muscular  skeletal:  Limited exam -global generalized weaknesses - in bed, able to move all 4 extremities,   2+ pulses,  symmetric, No pitting edema  Skin:  Dry, warm to touch, negative for any Rashes,  Wounds: Please see nursing documentation          Condition at discharge: good  The results of significant diagnostics from this hospitalization (including imaging, microbiology, ancillary and laboratory) are listed below for reference.   Imaging Studies: DG Chest Port 1 View  Result Date: 05/31/2022 CLINICAL DATA:  Shortness of breath EXAM:  PORTABLE CHEST 1 VIEW COMPARISON:  05/24/2022 FINDINGS: Cardiac size is within normal limits. Thoracic aorta is tortuous and ectatic. Lung fields are clear of any infiltrates or pulmonary edema. There is no significant pleural effusion or pneumothorax. IMPRESSION: No active disease. Electronically Signed   By: Elmer Picker M.D.   On: 05/31/2022 11:21   DG Chest 2 View  Result Date: 05/24/2022 CLINICAL DATA:  Weakness. EXAM: CHEST - 2 VIEW COMPARISON:  Chest x-ray 05/14/2022. FINDINGS: Low lung volumes. No consolidation. No visible pleural effusions or pneumothorax. Cardiomediastinal silhouette is accentuated by technique. IMPRESSION: No evidence of acute cardiopulmonary disease. Electronically Signed   By: Margaretha Sheffield M.D.   On: 05/24/2022 11:22   DG Chest 2 View  Result Date: 05/14/2022 CLINICAL DATA:  Generalized weakness with anxiety for the past week. EXAM: CHEST - 2 VIEW  COMPARISON:  Radiographs 11/13/2021. FINDINGS: The heart size and mediastinal contours are stable. There are stable calcified mediastinal lymph nodes. The lungs appear clear. There is no pleural effusion or pneumothorax. No acute osseous findings are evident. Mild degenerative changes in the spine. Telemetry leads overlie the chest. IMPRESSION: Stable chest.  No evidence of active cardiopulmonary process. Electronically Signed   By: Richardean Sale M.D.   On: 05/14/2022 10:36    Microbiology: Results for orders placed or performed during the hospital encounter of 05/27/22  SARS Coronavirus 2 by RT PCR (hospital order, performed in [facility] hospital lab) *cepheid single result test* Anterior Nasal Swab     Status: None   Collection Time: 05/27/22 10:47 AM   Specimen: Anterior Nasal Swab  Result Value Ref Range Status   SARS Coronavirus 2 by RT PCR NEGATIVE NEGATIVE Final    Comment: (NOTE) SARS-CoV-2 target nucleic acids are NOT DETECTED.  The SARS-CoV-2 RNA is generally detectable in upper and  lower respiratory specimens during the acute phase of infection. The lowest concentration of SARS-CoV-2 viral copies this assay can detect is 250 copies / mL. A negative result does not preclude SARS-CoV-2 infection and should not be used as the sole basis for treatment or other patient management decisions.  A negative result may occur with improper specimen collection / handling, submission of specimen other than nasopharyngeal swab, presence of viral mutation(s) within the areas targeted by this assay, and inadequate number of viral copies (<250 copies / mL). A negative result must be combined with clinical observations, patient history, and epidemiological information.  Fact Sheet for Patients:   https://www.patel.info/  Fact Sheet for Healthcare Providers: https://hall.com/  This test is not yet approved or  cleared by the [place] FDA and has been authorized for detection and/or diagnosis of SARS-CoV-2 by FDA under an Emergency Use Authorization (EUA).  This EUA will remain in effect (meaning this test can be used) for the duration of the COVID-19 declaration under Section 564(b)(1) of the Act, 21 U.S.C. section 360bbb-3(b)(1), unless the authorization is terminated or revoked sooner.  Performed at [facility], [address] [place], [place] [address]     Labs: CBC: Recent Labs  Lab 05/27/22 1101 05/28/22 1319 05/31/22 1128 06/01/22 1036 06/02/22 0518 06/03/22 0436  WBC 6.2 5.4 5.6 4.9 4.5 6.6  NEUTROABS 5.0 3.9 4.6 3.6  --   --   HGB 10.0* 9.3* 9.1* 8.3* 9.7* 9.5*  HCT 32.4* 29.6* 29.7* 27.7* 30.5* 30.3*  MCV 102.5* 100.3* 102.4* 104.1* 96.5 97.4  PLT 290 290 307 273 287 144   Basic Metabolic Panel: Recent Labs  Lab 05/27/22 1101 05/28/22 1319 05/31/22 1128 06/01/22 1036 06/01/22 1450 06/02/22 0518 06/03/22 0436  NA 141 138 140 140  --  142 141  K 3.9 3.9 4.0 3.7  --  3.6 3.4*  CL 108 107 108 109  --   112* 108  CO2 '27 24 26 25  '$ --  23 26  GLUCOSE 93 85 89 87  --  81 77  BUN '9 9 8 10  '$ --  8 9  CREATININE 0.99 0.87 0.82 0.86  --  0.82 0.78  CALCIUM 9.6 9.2 9.0 9.0  --  8.9 8.8*  MG 1.9  --   --   --  1.9  --   --   PHOS  --   --   --   --  3.0  --   --    Liver Function  Tests: Recent Labs  Lab 05/28/22 1319 05/31/22 1128 06/01/22 1036  AST '20 20 18  '$ ALT '11 13 12  '$ ALKPHOS 55 56 46  BILITOT 0.5 0.3 0.2*  PROT 6.9 6.9 6.4*  ALBUMIN 3.8 3.7 3.5   CBG: Recent Labs  Lab 05/28/22 1318 06/02/22 0719 06/03/22 0808  GLUCAP 83 90 76    Discharge time spent: greater than 30 minutes.  Signed: Deatra James, MD [facility] 06/03/2022

## 2022-06-06 ENCOUNTER — Encounter (HOSPITAL_COMMUNITY): Payer: Self-pay | Admitting: Emergency Medicine

## 2022-06-06 ENCOUNTER — Emergency Department (HOSPITAL_COMMUNITY)
Admission: EM | Admit: 2022-06-06 | Discharge: 2022-06-06 | Disposition: A | Discharge status: Home / Self Care | Payer: Medicare Other | Attending: Emergency Medicine | Admitting: Emergency Medicine

## 2022-06-06 ENCOUNTER — Encounter: Payer: Self-pay | Admitting: Internal Medicine

## 2022-06-06 ENCOUNTER — Other Ambulatory Visit: Payer: Self-pay

## 2022-06-06 DIAGNOSIS — R531 Weakness: Secondary | ICD-10-CM | POA: Diagnosis not present

## 2022-06-06 DIAGNOSIS — R55 Syncope and collapse: Secondary | ICD-10-CM | POA: Insufficient documentation

## 2022-06-06 DIAGNOSIS — R519 Headache, unspecified: Secondary | ICD-10-CM | POA: Diagnosis not present

## 2022-06-06 DIAGNOSIS — Z91040 Latex allergy status: Secondary | ICD-10-CM | POA: Diagnosis not present

## 2022-06-06 LAB — BASIC METABOLIC PANEL
Anion gap: 5 (ref 5–15)
BUN: 7 mg/dL — ABNORMAL LOW (ref 8–23)
CO2: 26 mmol/L (ref 22–32)
Calcium: 9.1 mg/dL (ref 8.9–10.3)
Chloride: 109 mmol/L (ref 98–111)
Creatinine, Ser: 0.85 mg/dL (ref 0.44–1.00)
GFR, Estimated: >60 mL/min (ref >60)
Glucose, Bld: 85 mg/dL (ref 70–99)
Potassium: 3.8 mmol/L (ref 3.5–5.1)
Sodium: 140 mmol/L (ref 135–145)

## 2022-06-06 LAB — CBC
HCT: 33.9 % — ABNORMAL LOW (ref 36.0–46.0)
Hemoglobin: 10.7 g/dL — ABNORMAL LOW (ref 12.0–15.0)
MCH: 30.7 pg (ref 26.0–34.0)
MCHC: 31.6 g/dL (ref 30.0–36.0)
MCV: 97.1 fL (ref 80.0–100.0)
Platelets: 294 10*3/uL (ref 150–400)
RBC: 3.49 MIL/uL — ABNORMAL LOW (ref 3.87–5.11)
RDW: 17 % — ABNORMAL HIGH (ref 11.5–15.5)
WBC: 5.3 10*3/uL (ref 4.0–10.5)
nRBC: 0 % (ref 0.0–0.2)

## 2022-06-06 MED ORDER — ACETAMINOPHEN 325 MG PO TABS
650.0000 mg | ORAL_TABLET | Freq: Once | ORAL | Status: AC
  Administered 2022-06-06: 650 mg via ORAL
  Filled 2022-06-06: qty 2

## 2022-06-06 NOTE — Discharge Instructions (Addendum)
You were seen in the emergency department today for headache and dizziness.  As we discussed your lab work looked reassuring, looks like your blood counts are coming up since being discharged from the hospital.  I am reassured by the fact that your headache got better after some Tylenol.  I've sent a referral to the GI doctor and attached their contact information for you to call in case you don't hear from them by the end of the week.

## 2022-06-06 NOTE — ED Triage Notes (Signed)
Pt presents via [facility] for weakness and headache, seen for same on 05/31/22.

## 2022-06-06 NOTE — ED Notes (Signed)
Purewick placed on pt. 

## 2022-06-06 NOTE — ED Provider Notes (Signed)
[facility] EMERGENCY DEPARTMENT Provider Note   CSN: [id number] Arrival date & time: 06/06/22  1002     History  Chief Complaint  Patient presents with   Near Syncope    Melody Mitchell is a 81 y.o. female who presents the emergency department complaining of headache and dizziness starting this morning.  Patient was seen for similar symptoms on 7/18 and was admitted to the hospital for anemia due to acute GI bleed with positive Hemoccult.  She states that since getting out of the hospital she has been feeling otherwise well, but overall does not feel safe living by herself while she continues to have these dizzy spells.  She woke up around 5 AM this morning to use the bathroom and first noticed she felt dizzy, and that persisted until around the time she got to the ER.  While that is resolved, she is complaining that her headache has persisted, has not taken any medication for it.  Denies any nausea, vomiting, diarrhea, or blood in stool.  However when she was seen previously, she hadn't seen blood in her stool at that time.  Denies any numbness, tingling, difficulty speaking or walking.   Near Syncope Associated symptoms include headaches. Pertinent negatives include no chest pain, no abdominal pain and no shortness of breath.       Home Medications Prior to Admission medications   Medication Sig Start Date End Date Taking? Authorizing Provider  amitriptyline (ELAVIL) 25 MG tablet Take 25 mg by mouth at bedtime.    [provider]  atorvastatin (LIPITOR) 40 MG tablet Take 40 mg by mouth daily.    [provider]  escitalopram (LEXAPRO) 10 MG tablet Take 10 mg by mouth daily.    [provider]  ezetimibe (ZETIA) 10 MG tablet Take 10 mg by mouth in the morning.  11/11/19   [provider]  pantoprazole (PROTONIX) 40 MG tablet Take 1 tablet (40 mg total) by mouth 2 (two) times daily. 06/03/22 07/03/22  Shahmehdi, Valeria Batman, MD  potassium chloride SA  (KLOR-CON) 20 MEQ tablet Take 20 mEq by mouth 2 (two) times daily. 03/20/20   [provider]      Allergies    Latex, Sulfa antibiotics, Other, and Pravastatin sodium    Review of Systems   Review of Systems  Constitutional:  Positive for fatigue. Negative for chills and fever.  Eyes:  Negative for photophobia and visual disturbance.  Respiratory:  Negative for shortness of breath.   Cardiovascular:  Positive for near-syncope. Negative for chest pain.  Gastrointestinal:  Negative for abdominal pain, blood in stool, constipation, diarrhea, nausea and vomiting.  Genitourinary:  Negative for dysuria.  Neurological:  Positive for dizziness and headaches. Negative for syncope, speech difficulty, weakness and numbness.  All other systems reviewed and are negative.   Physical Exam Updated Vital Signs BP (!) 125/95   Pulse 88   Temp 98.8 F (37.1 C) (Oral)   Resp 17   Ht '5\' 4"'$  (1.626 m)   Wt 62.3 kg   SpO2 94%   BMI 23.58 kg/m  Physical Exam Vitals and nursing note reviewed.  Constitutional:      Appearance: Normal appearance.  HENT:     Head: Normocephalic and atraumatic.  Eyes:     Extraocular Movements: Extraocular movements intact.     Conjunctiva/sclera: Conjunctivae normal.     Pupils: Pupils are equal, round, and reactive to light.  Cardiovascular:     Rate and Rhythm: Normal rate  and regular rhythm.  Pulmonary:     Effort: Pulmonary effort is normal. No respiratory distress.     Breath sounds: Normal breath sounds.  Abdominal:     General: There is no distension.     Palpations: Abdomen is soft.     Tenderness: There is no abdominal tenderness.  Skin:    General: Skin is warm and dry.  Neurological:     General: No focal deficit present.     Mental Status: She is alert.     Comments: Neuro: Speech is clear, able to follow commands. CN III-XII intact grossly intact. PERRLA. EOMI. Sensation intact throughout. Str 5/5 all extremities.     ED Results /  Procedures / Treatments   Labs (all labs ordered are listed, but only abnormal results are displayed) Labs Reviewed  BASIC METABOLIC PANEL - Abnormal; Notable for the following components:      Result Value   BUN 7 (*)    All other components within normal limits  CBC - Abnormal; Notable for the following components:   RBC 3.49 (*)    Hemoglobin 10.7 (*)    HCT 33.9 (*)    RDW 17.0 (*)    All other components within normal limits  POC OCCULT BLOOD, ED - Normal  URINALYSIS, ROUTINE W REFLEX MICROSCOPIC  CBG MONITORING, ED    EKG EKG Interpretation  Date/Time:  Monday June 06 2022 10:20:14 EDT Ventricular Rate:  91 PR Interval:  154 QRS Duration: 93 QT Interval:  357 QTC Calculation: 440 R Axis:   -36 Text Interpretation: Sinus rhythm Left axis deviation Abnormal R-wave progression, early transition since last tracing no significant change Confirmed by Daleen Bo [phone]) on 06/06/2022 10:41:46 AM  Radiology No results found.  Procedures Procedures    Medications Ordered in ED Medications  acetaminophen (TYLENOL) tablet 650 mg (650 mg Oral Given 06/06/22 1056)    ED Course/ Medical Decision Making/ A&P                           Medical Decision Making Amount and/or Complexity of Data Reviewed Labs: ordered.  Risk OTC drugs.  This patient is a 81 y.o. female  who presents to the ED for concern of headache and dizziness.   Differential diagnoses prior to evaluation: The emergent differential diagnosis includes, but is not limited to,  Intracranial hemorrhage, meningitis, CVA, intracranial tumor, venous sinus thrombosis, migraine, cluster headache, hypertension, drug related, pseudotumor cerebri, AVM, head injury, tension headache, sinusitis, dental abscess, otitis media, TMJ, depression, temporal arteritis, glaucoma, trigeminal neuralgia. This is not an exhaustive differential.   Past Medical History / Co-morbidities: HLD, GERD, chronic iron deficiency anemia  (baseline Hgb around 10), hypertension, AVM of small bowel  Additional history: Chart reviewed. Pertinent results include: Was seen on 7/19 for weakness and fatigue.  She was admitted to the hospital for anemia secondary to GI bleed.  Had drop in hemoglobin from 9.5-8.3, and a positive Hemoccult.  Was given blood transfusion. Was discharged on 7/21 after upper endoscopy with cauterized small bowel AVMs.   Physical Exam: Physical exam performed. The pertinent findings include: Normal vital signs.  Abdomen soft, nontender.  Normal neurologic exam as above.  Lab Tests/Imaging studies: I personally interpreted labs/imaging and the pertinent results include: Hemoglobin 10.7, improved from 9.5 three days ago.  No leukocytosis.  Normal electrolytes.  Normal kidney function.  Hemoccult negative.  Patient unable to give urine sample before requesting discharge.  Cardiac monitoring: EKG performed and interpreted by my attending physician Dr Eulis Foster. Showed sinus rhythm, and I agree with this interpretation.   Medications: I ordered medication including Tylenol.  I have reviewed the patients home medicines and have made adjustments as needed.  Upon reevaluation patient states that her headache has resolved   Disposition: After consideration of the diagnostic results and the patients response to treatment, I feel that emergency department workup does not suggest an emergent condition requiring admission or immediate intervention beyond what has been performed at this time. The plan is: Discharge to home with consult for transition of care for home health, and referral for gastroenterology post Hospital discharge.  Encourage patient to stay for urinalysis as there is a possibility of infection contributing to her symptoms, patient understands this and does not want to stay for this test at this time.  She understands that if we were to miss a diagnosis based on not being able to obtain this test, it could lead to  worsening infection and serious illness. She demonstrates capacity to make medical decisions and understands this risk. Suspect her symptoms are likely related to recovery from being in the hospital. Low concern for acute intracranial abnormalities requiring imaging at this time based on normal neurologic exam and resolution of symptoms with tylenol.  The patient is safe for discharge and has been instructed to return immediately for worsening symptoms, change in symptoms or any other concerns.         Final Clinical Impression(s) / ED Diagnoses Final diagnoses:  Near syncope  Generalized weakness  Acute nonintractable headache, unspecified headache type    Rx / DC Orders ED Discharge Orders          Ordered    Ambulatory referral to Gastroenterology        06/06/22 1403           Portions of this report may have been transcribed using voice recognition software. Every effort was made to ensure accuracy; however, inadvertent computerized transcription errors may be present.    Estill Cotta 06/06/22 1428    Daleen Bo, MD 06/06/22 [phone]

## 2022-06-06 NOTE — ED Notes (Signed)
Negative Occult Blood.

## 2022-06-10 ENCOUNTER — Inpatient Hospital Stay (HOSPITAL_BASED_OUTPATIENT_CLINIC_OR_DEPARTMENT_OTHER): Payer: Medicare Other | Admitting: Physician Assistant

## 2022-06-10 ENCOUNTER — Inpatient Hospital Stay (HOSPITAL_COMMUNITY): Payer: Medicare Other

## 2022-06-10 VITALS — BP 109/71 | HR 74 | Temp 97.1°F | Resp 18

## 2022-06-10 DIAGNOSIS — T452X1A Poisoning by vitamins, accidental (unintentional), initial encounter: Secondary | ICD-10-CM | POA: Diagnosis not present

## 2022-06-10 DIAGNOSIS — D649 Anemia, unspecified: Secondary | ICD-10-CM

## 2022-06-10 DIAGNOSIS — E538 Deficiency of other specified B group vitamins: Secondary | ICD-10-CM

## 2022-06-10 DIAGNOSIS — D500 Iron deficiency anemia secondary to blood loss (chronic): Secondary | ICD-10-CM

## 2022-06-10 DIAGNOSIS — D509 Iron deficiency anemia, unspecified: Secondary | ICD-10-CM | POA: Diagnosis not present

## 2022-06-10 DIAGNOSIS — I10 Essential (primary) hypertension: Secondary | ICD-10-CM

## 2022-06-10 DIAGNOSIS — E559 Vitamin D deficiency, unspecified: Secondary | ICD-10-CM | POA: Diagnosis not present

## 2022-06-10 DIAGNOSIS — R634 Abnormal weight loss: Secondary | ICD-10-CM

## 2022-06-10 DIAGNOSIS — Z90710 Acquired absence of both cervix and uterus: Secondary | ICD-10-CM

## 2022-06-10 DIAGNOSIS — Z87891 Personal history of nicotine dependence: Secondary | ICD-10-CM

## 2022-06-10 LAB — COMPREHENSIVE METABOLIC PANEL
ALT: 13 U/L (ref 0–44)
AST: 19 U/L (ref 15–41)
Albumin: 3.9 g/dL (ref 3.5–5.0)
Alkaline Phosphatase: 63 U/L (ref 38–126)
Anion gap: 6 (ref 5–15)
BUN: 11 mg/dL (ref 8–23)
CO2: 25 mmol/L (ref 22–32)
Calcium: 9.2 mg/dL (ref 8.9–10.3)
Chloride: 107 mmol/L (ref 98–111)
Creatinine, Ser: 0.96 mg/dL (ref 0.44–1.00)
GFR, Estimated: 59 mL/min — ABNORMAL LOW (ref >60)
Glucose, Bld: 89 mg/dL (ref 70–99)
Potassium: 4.2 mmol/L (ref 3.5–5.1)
Sodium: 138 mmol/L (ref 135–145)
Total Bilirubin: 0.3 mg/dL (ref 0.3–1.2)
Total Protein: 7.2 g/dL (ref 6.5–8.1)

## 2022-06-10 LAB — CBC WITH DIFFERENTIAL/PLATELET
Abs Immature Granulocytes: 0.01 10*3/uL (ref 0.00–0.07)
Basophils Absolute: 0 10*3/uL (ref 0.0–0.1)
Basophils Relative: 1 %
Eosinophils Absolute: 0.1 10*3/uL (ref 0.0–0.5)
Eosinophils Relative: 2 %
HCT: 34.4 % — ABNORMAL LOW (ref 36.0–46.0)
Hemoglobin: 10.6 g/dL — ABNORMAL LOW (ref 12.0–15.0)
Immature Granulocytes: 0 %
Lymphocytes Relative: 19 %
Lymphs Abs: 0.8 10*3/uL (ref 0.7–4.0)
MCH: 30.5 pg (ref 26.0–34.0)
MCHC: 30.8 g/dL (ref 30.0–36.0)
MCV: 98.9 fL (ref 80.0–100.0)
Monocytes Absolute: 0.4 10*3/uL (ref 0.1–1.0)
Monocytes Relative: 8 %
Neutro Abs: 3 10*3/uL (ref 1.7–7.7)
Neutrophils Relative %: 70 %
Platelets: 307 10*3/uL (ref 150–400)
RBC: 3.48 MIL/uL — ABNORMAL LOW (ref 3.87–5.11)
RDW: 16.6 % — ABNORMAL HIGH (ref 11.5–15.5)
WBC: 4.3 10*3/uL (ref 4.0–10.5)
nRBC: 0 % (ref 0.0–0.2)

## 2022-06-10 LAB — IRON AND TIBC
Iron: 36 ug/dL (ref 28–170)
Saturation Ratios: 13 % (ref 10.4–31.8)
TIBC: 273 ug/dL (ref 250–450)
UIBC: 237 ug/dL

## 2022-06-10 LAB — VITAMIN D 25 HYDROXY (VIT D DEFICIENCY, FRACTURES): Vit D, 25-Hydroxy: 143.62 ng/mL — ABNORMAL HIGH (ref 30–100)

## 2022-06-10 LAB — FERRITIN: Ferritin: 59 ng/mL (ref 11–307)

## 2022-06-10 MED ORDER — SODIUM CHLORIDE 0.9 % IV SOLN: Freq: Once | INTRAVENOUS | Status: AC

## 2022-06-10 MED ORDER — ACETAMINOPHEN 325 MG PO TABS
650.0000 mg | ORAL_TABLET | Freq: Once | ORAL | Status: AC
  Administered 2022-06-10: 650 mg via ORAL
  Filled 2022-06-10: qty 2

## 2022-06-10 MED ORDER — SODIUM CHLORIDE 0.9 % IV SOLN
510.0000 mg | Freq: Once | INTRAVENOUS | Status: AC
  Administered 2022-06-10: 510 mg via INTRAVENOUS
  Filled 2022-06-10: qty 17

## 2022-06-10 MED ORDER — LORATADINE 10 MG PO TABS
10.0000 mg | ORAL_TABLET | Freq: Once | ORAL | Status: AC
  Administered 2022-06-10: 10 mg via ORAL
  Filled 2022-06-10: qty 1

## 2022-06-10 NOTE — Patient Instructions (Addendum)
[place] at [facility] Discharge Instructions  You were seen today by Tarri Abernethy PA-C for your iron deficiency anemia.  ** Our scheduler is gone for today, but will call you next week to set up your next appointments.  Please write your appointments here to help you remember them:  IV IRON INFUSION on ____________________________________   LAB APPOINTMENT on ______________________________________   DOCTOR'S OFFICE VISIT with Tarri Abernethy PA-C on:  __________________________________   - - - - - - - - - - - - - - - - - -   Your blood and iron levels are low because of the bleeding in your intestines.  You received 1 dose of IV iron today.  We will schedule you for another dose of IV iron x 1.  You should also continue to follow-up with your gastroenterologist [facility] doctor) for ongoing monitoring and management of your intestinal blood loss.     You should CONTINUE taking over-the-counter vitamin B12 supplement 1000 mcg daily  Your vitamin D levels are too high.  As we have discussed at your previous appointments, you should NOT be taking any vitamin D supplement or multivitamin at this time.  It is extremely important that you STOP TAKING ANY VITAMIN D SUPPLEMENTS to avoid toxic effects of vitamin D overdose.   You should CONTINUE drinking Ensure (or Boost) 1-2 drinks daily due to your decreased appetite and weight loss.    LABS: Return in 2 months for repeat labs  FOLLOW-UP APPOINTMENT: Office visit in 2 months, after labs   - - - - - - - - - - - - - - - - - -    Thank you for choosing [place] at [facility] to provide your oncology and hematology care.  To afford each patient quality time with our provider, please arrive at least 15 minutes before your scheduled appointment time.   If you have a lab appointment with the [facility] please come in thru the Main Entrance and check in at the main information  desk.  You need to re-schedule your appointment should you arrive 10 or more minutes late.  We strive to give you quality time with our providers, and arriving late affects you and other patients whose appointments are after yours.  Also, if you no show three or more times for appointments you may be dismissed from the clinic at the providers discretion.     Again, thank you for choosing [facility].  Our hope is that these requests will decrease the amount of time that you wait before being seen by our physicians.       _____________________________________________________________  Should you have questions after your visit to [facility], please contact our office at [phone] and follow the prompts.  Our office hours are 8:00 a.m. and 4:30 p.m. Monday - Friday.  Please note that voicemails left after 4:00 p.m. may not be returned until the following business day.  We are closed weekends and major holidays.  You do have access to a nurse 24-7, just call the main number to the clinic [phone] and do not press any options, hold on the line and a nurse will answer the phone.    For prescription refill requests, have your pharmacy contact our office and allow 72 hours.    Due to Covid, you will need to wear a mask upon entering the hospital. If you do not have a  mask, a mask will be given to you at the Main Entrance upon arrival. For doctor visits, patients may have 1 support person age 83 or older with them. For treatment visits, patients can not have anyone with them due to social distancing guidelines and our immunocompromised population.

## 2022-06-10 NOTE — Progress Notes (Signed)
[name], [name] [id number]   CLINIC:  Medical Oncology/Hematology  PCP:  [name], MD [address] [place] [address] [phone]   REASON FOR VISIT:  Follow-up for iron deficiency anemia   CURRENT THERAPY: Intermittent IV iron infusions (most recently 03/14/2022 & 03/21/2022)  INTERVAL HISTORY:  [name] 81 y.o. female returns for routine follow-up of her iron deficiency anemia.  She was last seen by Tarri Abernethy PA-C on 05/10/2022.  She was scheduled for 2 doses of IV [name], but had to cancel her second dose on several occasions due to intervening circumstances.  She received 1 dose of IV [name] on 05/12/2022.  She was hospitalized from 06/01/2022 for acute blood loss anemia from acute GI bleeding.  She presented to ED on with acute on chronic weakness and fatigue, found to be hemoccult positive with Hgb 8.3 (dropped from 10.0 two weeks prior).  She received 1 unit PRBC.  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 She had another ED visit on 06/06/2022 for headache and dizziness, Hgb was improved.   She reports that she is not having black bowel movements as often as before, but is unsure of when her last episode of melena was. She denies any other bleeding such as epistaxis, hematemesis, hematuria, or hematochezia.   She has had increased fatigue ever since her hospitalization, but reports that she is slowly regaining some of her energy.  She has intermittent headaches.  She denies any pica, chest pain, dyspnea on exertion, lightheadedness, or syncope.  She had previously lost about 15 to 20 pounds in the course of 6 months, but her weight has been stable since February 2023.  She is drinking Ensure once daily.  She does still note some decreased appetite.  She has 50% energy and 50% appetite. She endorses that she is maintaining a stable weight.   REVIEW OF  SYSTEMS:    Review of Systems  Constitutional:  Positive for fatigue. Negative for chills, diaphoresis, fever and unexpected weight change.  HENT:   Negative for lump/mass and nosebleeds.   Eyes:  Negative for eye problems.  Respiratory:  Negative for cough, hemoptysis and shortness of breath.   Cardiovascular:  Negative for chest pain, leg swelling and palpitations.  Gastrointestinal:  Negative for abdominal pain, blood in stool, constipation, diarrhea, nausea and vomiting.  Genitourinary:  Negative for hematuria.   Musculoskeletal:  Positive for arthralgias (knees).  Skin: Negative.   Neurological:  Positive for headaches. Negative for dizziness and light-headedness.  Hematological:  Does not bruise/bleed easily.  Psychiatric/Behavioral:  Positive for confusion and sleep disturbance. Negative for depression. The patient is not nervous/anxious.       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 four colon polyps removed. No source  for change in bowel habits identified 3. Rectal bleeding due to large internal hemorrhoids    COLONOSCOPY W/ BIOPSIES  6 2010   Dr. Oneida Alar: Polypoid appearing lesion of the ascending colon, 3 mm sessile rectal polyp, small internal hemorrhoids. Pathology revealed polypoid mucosa, no adenomatous changes   Double-balloon enteroscopy, antegrade   April 2011   Dr. 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   ENTEROSCOPY N/A 06/02/2022   Procedure: ENTEROSCOPY;  Surgeon: Daneil Dolin, MD;  Location: [facility];  Service: Endoscopy;  Laterality: N/A;  with pediatric colonoscope   ESOPHAGOGASTRODUODENOSCOPY  04/2009   Dr. Oneida Alar: Patent Schatzki ring dilated with advancing the scope, patchy erythema in the antrum, 2 small AVMs in the duodenal bulb, 2 additional AVMs noted in the second portion the duodenum, mild gastritis on pathology   ESOPHAGOGASTRODUODENOSCOPY N/A 06/29/2015   SLF: 1. 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 small hiatal hernia 3. Mild non-erosive gastririts- NO obvious source for dyspepsia identified.    ESOPHAGOGASTRODUODENOSCOPY N/A 03/13/2020   Fields: Low-grade narrowing Schatzki ring, small hiatal hernia, multiple nonbleeding angiectasia's in the jejunum treated with APC.  Spot tattoo in jejunum at 90 cm from the incisors.   ESOPHAGOGASTRODUODENOSCOPY N/A 06/02/2022   Procedure: ESOPHAGOGASTRODUODENOSCOPY (EGD);  Surgeon: Daneil Dolin, MD;  Location: [facility];  Service: Endoscopy;  Laterality: N/A;   EYE SURGERY     HOT HEMOSTASIS  06/02/2022   Procedure: HOT HEMOSTASIS (ARGON PLASMA COAGULATION/BICAP);  Surgeon: Daneil Dolin, MD;  Location: [facility];  Service: Endoscopy;;     SOCIAL HISTORY:  Social History   Socioeconomic History   Marital status: Divorced    Spouse name: Not on file   Number of children: 1   Years of education: Not on file   Highest education level: Not on file  Occupational History   Not on file  Tobacco Use   Smoking status: Former    Packs/day: 1.00    Years: 25.00    Total pack years: 25.00    Types: Cigarettes    Quit date: 09/14/2007    Years since quitting: 14.7   Smokeless tobacco: Never  Vaping Use   Vaping Use: Never used  Substance and Sexual Activity   Alcohol use: Not Currently    Comment: occasional/rare (Brandy  every now and then)   Drug use: No   Sexual activity: Yes    Birth control/protection: Post-menopausal, Surgical  Other Topics Concern   Not on file  Social History Narrative   Not on file   Social Determinants of Health   Financial Resource Strain: Not on file  Food Insecurity: Not on file  Transportation Needs: Not on file  Physical Activity: Not on file  Stress: Not on file  Social Connections: Not on file  Intimate Partner Violence: Not on file    FAMILY HISTORY:  Family History  Problem Relation Age of Onset   Heart failure Mother    Diabetes Sister    Heart attack Father    Colon cancer Neg Hx    Gastric cancer Neg Hx    Esophageal cancer Neg Hx     CURRENT MEDICATIONS:  Outpatient Encounter Medications as of 06/10/2022  Medication Sig   amitriptyline (ELAVIL) 25 MG tablet Take 25 mg by mouth at bedtime.   atorvastatin (LIPITOR) 40 MG tablet Take 40 mg  by mouth daily.   escitalopram (LEXAPRO) 10 MG tablet Take 10 mg by mouth daily.   ezetimibe (ZETIA) 10 MG tablet Take 10 mg by mouth in the morning.    pantoprazole (PROTONIX) 40 MG tablet Take 1 tablet (40 mg total) by mouth 2 (two) times daily.   potassium chloride SA (KLOR-CON) 20 MEQ tablet Take 20 mEq by mouth 2 (two) times daily.   Facility-Administered Encounter Medications as of 06/10/2022  Medication   [COMPLETED] 0.9 %  sodium chloride infusion   [COMPLETED] acetaminophen (TYLENOL) tablet 650 mg   ferumoxytol ([name]) 510 mg in sodium chloride 0.9 % 100 mL IVPB   [COMPLETED] loratadine (CLARITIN) tablet 10 mg    ALLERGIES:  Allergies  Allergen Reactions   Latex Hives, Itching and Rash   Sulfa Antibiotics Hives and Itching    Other reaction(s): hives   Other Hives   Pravastatin Sodium Other (See Comments)     PHYSICAL EXAM:  ECOG PERFORMANCE STATUS: 1 - Symptomatic but completely ambulatory    There were no vitals filed for this visit. There were no vitals filed for this visit. Physical  Exam Constitutional:      Appearance: Normal appearance. She is obese.  HENT:     Head: Normocephalic and atraumatic.     Mouth/Throat:     Mouth: Mucous membranes are moist.  Eyes:     Extraocular Movements: Extraocular movements intact.     Pupils: Pupils are equal, round, and reactive to light.  Cardiovascular:     Rate and Rhythm: Normal rate and regular rhythm.     Pulses: Normal pulses.     Heart sounds: Normal heart sounds.  Pulmonary:     Effort: Pulmonary effort is normal.     Breath sounds: Normal breath sounds.  Abdominal:     General: Bowel sounds are normal.     Palpations: Abdomen is soft.     Tenderness: There is no abdominal tenderness.  Musculoskeletal:        General: No swelling.     Right lower leg: No edema.     Left lower leg: No edema.  Lymphadenopathy:     Cervical: No cervical adenopathy.  Skin:    General: Skin is warm and dry.  Neurological:     General: No focal deficit present.     Mental Status: She is alert. She is disoriented.     Comments: Brief cognitive assessment - oriented to person and place, unable to recall the current year, suggested that it was possibly 2029.  Incorrectly named current president as "Chief Financial Officer."  Was able to immediately repeat the words "cat," "candle," and "box," but was only able to recall 1 out of the 3 words 60 seconds later.  Psychiatric:        Mood and Affect: Mood normal.        Behavior: Behavior normal.        Cognition and Memory: Cognition is impaired. Memory is impaired.      LABORATORY DATA:  I have reviewed the labs as listed.  CBC    Component Value Date/Time   WBC 4.3 06/10/2022 1134   RBC 3.48 (L) 06/10/2022 1134   HGB 10.6 (L) 06/10/2022 1134   HCT 34.4 (L) 06/10/2022 1134   PLT 307 06/10/2022 1134   MCV 98.9 06/10/2022 1134   MCH 30.5 06/10/2022 1134   MCHC 30.8 06/10/2022 1134   RDW 16.6 (H) 06/10/2022 1134   LYMPHSABS 0.8 06/10/2022 1134   MONOABS  0.4 06/10/2022 1134    EOSABS 0.1 06/10/2022 1134   BASOSABS 0.0 06/10/2022 1134      Latest Ref Rng & Units 06/10/2022   11:34 AM 06/06/2022   10:37 AM 06/03/2022    4:36 AM  CMP  Glucose 70 - 99 mg/dL 89  85  77   BUN 8 - 23 mg/dL '11  7  9   '$ Creatinine 0.44 - 1.00 mg/dL 0.96  0.85  0.78   Sodium 135 - 145 mmol/L 138  140  141   Potassium 3.5 - 5.1 mmol/L 4.2  3.8  3.4   Chloride 98 - 111 mmol/L 107  109  108   CO2 22 - 32 mmol/L '25  26  26   '$ Calcium 8.9 - 10.3 mg/dL 9.2  9.1  8.8   Total Protein 6.5 - 8.1 g/dL 7.2     Total Bilirubin 0.3 - 1.2 mg/dL 0.3     Alkaline Phos 38 - 126 U/L 63     AST 15 - 41 U/L 19     ALT 0 - 44 U/L 13       DIAGNOSTIC IMAGING:  I have independently reviewed the relevant imaging and discussed with the patient.  ASSESSMENT & PLAN: 1.  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  She also had a EGD and colonoscopy in 2016 did not show any evidence of active bleeding areas, however she had large internal hemorrhoids. - Repeat EGD 03/13/2020 with a Schatzki's ring, small hiatal hernia, normal duodenum, multiple nonbleeding angiectasia is in the jejunum treated with APC coagulation and clip -- She was hospitalized from 06/01/2022 for acute blood loss anemia from acute GI bleeding.  She presented to ED on with acute on chronic weakness and fatigue, found to be hemoccult positive with Hgb 8.3 (dropped from 10.0 two weeks prior).  She received 1 unit PRBC.   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  Renal function normal for age. - Last [name] infusion on 05/12/2022 - Most recent labs (06/10/2022): Hgb 10.6/MCV 98.9, ferritin 59, iron saturation 13% - She continues to report dark stools,  about once every 1-2 weeks   - She is symptomatic with fatigue     - She is followed by Neil Crouch, PA-C at [facility] - PLAN: Recommend IV [name] x2, first dose to be given today. - Repeat labs and RTC in 2 months - Follow-up with GI as scheduled   2.  Vitamin D deficiency, with toxicity: - She was instructed at prior appointments to stop taking vitamin D due to elevated levels, but she does not think that she did. - Vitamin D (12/20/2021) remained elevated at 142.55 - she was once again instructed to stop taking her vitamin D, but she has continued to take it.   -Prior labs (05/03/2022) showed significantly elevated vitamin D at 193.00, but labs today (06/02/2022) show vitamin D trending down to 143.6 to - PLAN: No vitamin D supplement at this time. - We will restart her on the lower dose if appropriate in the future.  Recheck vitamin D level in 3-6 months    3.  Borderline vitamin B12 deficiency: - Labs on 05/03/2022  show vitamin B12 at 691 with normal methylmalonic acid - She is not sure if she is taking her B12 supplement or not, but it is listed on her medication list from her pharmacy - PLAN: Encouraged her to continue vitamin B12 supplement.  We will check B12 and methylmalonic acid in 6 months (August 2023).    4.  Poor appetite and weight loss   - Reports decreased appetite for the past several months - Weight in September 2022 was 147 pounds, up to 159 pounds on 11/16/2021. - Weight today is  138 pounds, which is stable compared to her last visit in February 2023 - PLAN: Patient instructed to continue drinking 1-2 Ensure/Boost beverages daily. - We will check weight at follow-up appointment in 2 months. - If further weight loss, will consider appetite stimulant, dietitian referral, and/or additional work-up.   PLAN SUMMARY & DISPOSITION:   [name] x1 Labs in 2 months RTC 1 week after labs  All questions were answered. The patient knows to call the  clinic with any problems, questions or concerns.  Medical decision making: Moderate    Time spent on visit: I spent 20 minutes counseling the patient face to face. The total time spent in the appointment was 30 minutes and more than 50% was on counseling.   Harriett Rush, PA-C   06/10/22 4:50 PM

## 2022-06-10 NOTE — Progress Notes (Signed)
[name] infusion given per orders. Patient tolerated it well without problems. Vitals stable and discharged home from clinic ambulatory. Follow up as scheduled.  

## 2022-06-20 ENCOUNTER — Inpatient Hospital Stay: Payer: Medicare Other | Attending: Hematology

## 2022-06-20 MED FILL — Ferumoxytol Inj 510 MG/17ML (30 MG/ML) (Elemental Fe): INTRAVENOUS | Qty: 17 | Status: AC

## 2022-07-05 ENCOUNTER — Other Ambulatory Visit (HOSPITAL_COMMUNITY): Payer: Medicare Other

## 2022-07-12 ENCOUNTER — Ambulatory Visit (HOSPITAL_COMMUNITY): Payer: Medicare Other | Admitting: Physician Assistant

## 2022-07-24 ENCOUNTER — Encounter (HOSPITAL_COMMUNITY): Payer: Self-pay

## 2022-07-24 ENCOUNTER — Other Ambulatory Visit: Payer: Self-pay

## 2022-07-24 ENCOUNTER — Emergency Department (HOSPITAL_COMMUNITY)
Admission: EM | Admit: 2022-07-24 | Discharge: 2022-07-24 | Disposition: A | Discharge status: Home / Self Care | Payer: Medicare Other | Attending: Emergency Medicine | Admitting: Emergency Medicine

## 2022-07-24 DIAGNOSIS — E876 Hypokalemia: Secondary | ICD-10-CM | POA: Insufficient documentation

## 2022-07-24 DIAGNOSIS — R110 Nausea: Secondary | ICD-10-CM | POA: Insufficient documentation

## 2022-07-24 DIAGNOSIS — Z91040 Latex allergy status: Secondary | ICD-10-CM | POA: Diagnosis not present

## 2022-07-24 DIAGNOSIS — D649 Anemia, unspecified: Secondary | ICD-10-CM | POA: Diagnosis not present

## 2022-07-24 DIAGNOSIS — R42 Dizziness and giddiness: Secondary | ICD-10-CM | POA: Insufficient documentation

## 2022-07-24 DIAGNOSIS — I10 Essential (primary) hypertension: Secondary | ICD-10-CM | POA: Diagnosis not present

## 2022-07-24 LAB — URINALYSIS, ROUTINE W REFLEX MICROSCOPIC
Bilirubin Urine: NEGATIVE
Glucose, UA: NEGATIVE mg/dL
Hgb urine dipstick: NEGATIVE
Ketones, ur: NEGATIVE mg/dL
Leukocytes,Ua: NEGATIVE
Nitrite: NEGATIVE
Protein, ur: NEGATIVE mg/dL
Specific Gravity, Urine: 1.006 (ref 1.005–1.030)
pH: 7 (ref 5.0–8.0)

## 2022-07-24 LAB — BASIC METABOLIC PANEL
Anion gap: 8 (ref 5–15)
BUN: 8 mg/dL (ref 8–23)
CO2: 24 mmol/L (ref 22–32)
Calcium: 8.8 mg/dL — ABNORMAL LOW (ref 8.9–10.3)
Chloride: 108 mmol/L (ref 98–111)
Creatinine, Ser: 0.93 mg/dL (ref 0.44–1.00)
GFR, Estimated: >60 mL/min (ref >60)
Glucose, Bld: 82 mg/dL (ref 70–99)
Potassium: 3.1 mmol/L — ABNORMAL LOW (ref 3.5–5.1)
Sodium: 140 mmol/L (ref 135–145)

## 2022-07-24 LAB — CBC
HCT: 30.7 % — ABNORMAL LOW (ref 36.0–46.0)
Hemoglobin: 9.8 g/dL — ABNORMAL LOW (ref 12.0–15.0)
MCH: 30.6 pg (ref 26.0–34.0)
MCHC: 31.9 g/dL (ref 30.0–36.0)
MCV: 95.9 fL (ref 80.0–100.0)
Platelets: 278 10*3/uL (ref 150–400)
RBC: 3.2 MIL/uL — ABNORMAL LOW (ref 3.87–5.11)
RDW: 14.5 % (ref 11.5–15.5)
WBC: 4.4 10*3/uL (ref 4.0–10.5)
nRBC: 0 % (ref 0.0–0.2)

## 2022-07-24 LAB — CBG MONITORING, ED: Glucose-Capillary: 89 mg/dL (ref 70–99)

## 2022-07-24 MED ORDER — POTASSIUM CHLORIDE 20 MEQ PO PACK
40.0000 meq | PACK | Freq: Once | ORAL | Status: AC
  Administered 2022-07-24: 40 meq via ORAL
  Filled 2022-07-24: qty 2

## 2022-07-24 NOTE — ED Provider Notes (Signed)
[facility] EMERGENCY DEPARTMENT Provider Note   CSN: [id number] Arrival date & time: 07/24/22  1252     History  Chief Complaint  Patient presents with   Dizziness   Nausea    Melody Mitchell is a 81 y.o. female past medical history significant for hyperlipidemia, hypertension, arthritis, iron deficiency anemia who presents with concern for isolated episode of nausea, dizziness that began either late last night or early this morning.  Patient reports that she had been sitting or laying in bed for a while, stood up to use the restroom and felt a little woozy, reports that she was able to make it to the restroom without difficulty, but when she returned to her bed she felt a little woozy still.  Patient reports that the symptoms have resolved entirely at this time and she feels better, requesting to go home.  She denies any chest pain, shortness of breath, ongoing nausea, vomiting, dizziness, balance issues.  She denies any numbness, weakness, facial droop, confusion, dysarthria, slurred speech.  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  She takes Lipitor and Zetia but does not take any anticoagulation.   Dizziness      Home Medications Prior to Admission medications   Medication Sig Start Date End Date Taking? Authorizing Provider  amitriptyline (ELAVIL) 25 MG tablet Take 25 mg by mouth at bedtime.    [provider]  atorvastatin (LIPITOR) 40 MG tablet Take 40 mg by mouth daily.    [provider]  escitalopram (LEXAPRO) 10 MG tablet Take 10 mg by mouth daily.    [provider]  ezetimibe (ZETIA) 10 MG tablet Take 10 mg by mouth in the morning.  11/11/19   [provider]  pantoprazole (PROTONIX) 40 MG tablet Take 1 tablet (40 mg total) by mouth 2 (two) times daily. 06/03/22 07/03/22  Shahmehdi, Valeria Batman, MD  potassium chloride SA (KLOR-CON) 20 MEQ tablet Take 20 mEq by mouth 2 (two) times daily. 03/20/20   [provider]      Allergies    Latex, Sulfa antibiotics, Other, and Pravastatin sodium    Review of Systems   Review of Systems  Neurological:  Positive for dizziness.  All other systems reviewed and are negative.   Physical Exam Updated Vital Signs BP 124/87   Pulse 82   Temp 99 F (37.2 C) (Oral)   Resp 18   Ht '5\' 4"'$  (1.626 m)   Wt 63.6 kg   SpO2 95%   BMI 24.07 kg/m  Physical Exam Vitals and nursing note reviewed.  Constitutional:      General: She is not in acute distress.    Appearance: Normal appearance.  HENT:     Head: Normocephalic and atraumatic.  Eyes:     General:        Right eye: No discharge.        Left eye: No discharge.  Cardiovascular:     Rate and Rhythm: Normal rate and regular rhythm.     Heart sounds: No murmur heard.    No friction rub. No gallop.     Comments: No cardiac arrhythmia noted, no tenderness palpation of the chest Pulmonary:     Effort: Pulmonary effort is normal.     Breath sounds: Normal breath sounds.  Abdominal:     General: Bowel sounds are normal.     Palpations: Abdomen is soft.  Skin:    General: Skin is warm and dry.  Capillary Refill: Capillary refill takes less than 2 seconds.  Neurological:     Mental Status: She is alert and oriented to person, place, and time.     Comments: Cranial nerves II through XII grossly intact.  Intact finger-nose, intact heel-to-shin.  Romberg negative, gait normal.  Alert and oriented x3.  Moves all 4 limbs spontaneously, normal coordination.  No pronator drift.  Intact strength 5 out of 5 bilateral upper and lower extremities.    Psychiatric:        Mood and Affect: Mood normal.        Behavior: Behavior normal.     ED Results / Procedures / Treatments   Labs (all labs ordered are listed, but only abnormal results are displayed) Labs Reviewed  BASIC METABOLIC PANEL - Abnormal; Notable for the following components:      Result Value   Potassium 3.1 (*)    Calcium 8.8 (*)    All  other components within normal limits  CBC - Abnormal; Notable for the following components:   RBC 3.20 (*)    Hemoglobin 9.8 (*)    HCT 30.7 (*)    All other components within normal limits  URINALYSIS, ROUTINE W REFLEX MICROSCOPIC  CBG MONITORING, ED    EKG None  Radiology No results found.  Procedures Procedures    Medications Ordered in ED Medications  potassium chloride (KLOR-CON) packet 40 mEq (40 mEq Oral Given 07/24/22 1413)    ED Course/ Medical Decision Making/ A&P                           Medical Decision Making Amount and/or Complexity of Data Reviewed Labs: ordered.   This patient is a 81 y.o. female who presents to the ED for concern of dizziness, nausea which were isolated for a few minutes and then resolved, this involves an extensive number of treatment options, and is a complaint that carries with it a high risk of complications and morbidity. The emergent differential diagnosis prior to evaluation includes, but is not limited to, syncope, near syncope, cardiogenic or neurogenic in nature, posterior circulation stroke, peripheral vertigo, orthostatic hypotension, COVID, flu, Mnire disease, versus other central or peripheral vertigo.  This is not an exhaustive differential.   Past Medical History / Co-morbidities / Social History: hyperlipidemia, hypertension, arthritis, iron deficiency anemia  Additional history: Chart reviewed. Pertinent results include: Reviewed lab work, imaging, patient has been seen frequently for dizziness, weakness, near syncopal episodes, she last received CT angio head and neck with and without contrast in May of this year which showed no evidence for acute stroke, or significant stenosis  Physical Exam: Physical exam performed. The pertinent findings include: On my exam patient is neurologically intact with no focal deficits, no ataxia, no issues with coordination and is able to stand and ambulate without difficulty.  She has  no signs of cardiac arrhythmia, respiratory distress, chest pain, tenderness palpation of the chest wall, and is alert and oriented, following commands without difficulty.  Lab Tests: I ordered, and personally interpreted labs.  The pertinent results include: BMP notable for hypokalemia, patient with history of same, we will orally replete, patient to speak with her primary care doctor about whether or not she should begin potassium supplementation again as if this becomes a chronic process.  CBC notable for anemia which is similar to slightly worsening compared to her baseline.  She denies any dark, tarry stools.  Hemoglobin 9.8 today.  Does not have a microcytic quality today, however she does have a history of iron deficiency anemia, encouraged her to continue iron supplementation.  Encourage close PCP follow-up to ensure hemoglobin is returning to normal level.  urinalysis still pending at time of patient's discharge, however  patient not having any urinary symptoms at this time.  Low clinical suspicion for acute UTI.  Cardiac Monitoring:  The patient was maintained on a cardiac monitor.  My attending physician Dr. Kathrynn Humble viewed and interpreted the cardiac monitored which showed an underlying rhythm of: Normal sinus rhythm, nonspecific T wave abnormalities, no clear evidence of ischemia, or arrhythmia. I agree with this interpretation.   Medications: I ordered medication including potassium chloride for hypokalemia. Reevaluation of the patient after these medicines showed that the patient improved. I have reviewed the patients home medicines and have made adjustments as needed.   Disposition: After consideration of the diagnostic results and the patients response to treatment, I feel that patient's symptoms are nonspecific, difficult to attribute her nausea, isolated episode of dizziness as potential posterior circulation TIA, or other cardiogenic or neurogenic syncope.  Lightheadedness with  standing may be attributable to patient's chronic anemia, slight orthostatic hypotension.  Based on her history and risk factors as well as advanced age discussed with the patient that I would recommend further work-up, at the very least CT of the head, however patient reports that she is feeling 100% fine at this time and does not want any additional work-up at this time.  She understands the risks for discharge home without complete work-up and would like to leave at this time.  As she is symptom-free at this time and I cannot definitively pain or symptoms on probable cardiogenic or neurogenic cause I do not think that she needs to leave [place] at this time, but urged strict return precautions for any return of symptoms, especially any strokelike symptoms, and encouraged close PCP follow-up.   emergency department workup does not suggest an emergent condition requiring admission or immediate intervention beyond what has been performed at this time. The plan is: As above. The patient is safe for discharge and has been instructed to return immediately for worsening symptoms, change in symptoms or any other concerns.  I discussed this case with my attending physician Dr. Kathrynn Humble who cosigned this note including patient's presenting symptoms, physical exam, and planned diagnostics and interventions. Attending physician stated agreement with plan or made changes to plan which were implemented.    Final Clinical Impression(s) / ED Diagnoses Final diagnoses:  Dizziness  Hypokalemia  Anemia, unspecified type    Rx / DC Orders ED Discharge Orders     None         Anselmo Pickler, PA-C 07/24/22 [place], MD 07/26/22 1232

## 2022-07-24 NOTE — ED Triage Notes (Signed)
Pt arrived [facility] for c/o nausea, dizziness started last night.

## 2022-07-24 NOTE — ED Notes (Signed)
Pad not working for [employer]

## 2022-07-24 NOTE — Discharge Instructions (Addendum)
You were seen and evaluated today for dizziness and nausea, with evaluation that you had we do not see any worrisome signs of cardiac arrhythmias, and I did not see any signs of neurologic deficits to suggest a stroke.  You did have slightly low potassium which we we will supplement today, as well as some anemia similar to baseline.  Recommend that you take over-the-counter iron supplementation every other day, as tolerated, as it can cause some stomach upset.  We replaced your potassium, but you may need to follow-up with your primary care doctor to assess whether you need to begin taking potassium supplementation once again.  In the meantime if you have worsening or returning nausea, dizziness, other concerns for weakness or balance issues please return to the emergency department for further evaluation.

## 2022-07-24 NOTE — ED Notes (Signed)
Pt does not have any of her vertigo meds to take for dizziness.

## 2022-08-01 ENCOUNTER — Encounter: Payer: Self-pay | Admitting: Gastroenterology

## 2022-08-01 ENCOUNTER — Ambulatory Visit (INDEPENDENT_AMBULATORY_CARE_PROVIDER_SITE_OTHER): Payer: Medicare Other | Admitting: Gastroenterology

## 2022-08-01 VITALS — BP 138/78 | HR 76 | Temp 97.8°F | Ht 64.0 in | Wt 136.2 lb

## 2022-08-01 DIAGNOSIS — K219 Gastro-esophageal reflux disease without esophagitis: Secondary | ICD-10-CM | POA: Diagnosis not present

## 2022-08-01 DIAGNOSIS — K5520 Angiodysplasia of colon without hemorrhage: Secondary | ICD-10-CM | POA: Diagnosis not present

## 2022-08-01 DIAGNOSIS — D500 Iron deficiency anemia secondary to blood loss (chronic): Secondary | ICD-10-CM

## 2022-08-01 NOTE — Patient Instructions (Addendum)
For now given your resolution of symptoms, I agree with holding off on colonoscopy for now given your age as we likely would not get much more information from performing it.  Please continue to monitor for any dark/tarry stools or any bright red blood in your stools.  If you start having any of these, please call the office and let us know.  If you begin having any dizziness, lightheadedness, weakness, or feelings like you may pass out I want you to head to the emergency department for further evaluation.  We may need to pursue colonoscopy at that point if symptoms reoccur.  I encourage you to continue taking your pantoprazole 40 mg daily.  It was a pleasure to see you today. I want to create trusting relationships with patients. If you receive a survey regarding your visit,  I greatly appreciate you taking time to fill this out on paper or through your MyChart. I value your feedback.  Venetia Night, MSN, FNP-BC, AGACNP-BC [facility]

## 2022-08-01 NOTE — Progress Notes (Signed)
GI Office Note    Referring Provider: [name], MD Primary Care Physician:  [name], MD Primary Gastroenterologist: Dr. Abbey Chatters  Date:  08/01/2022  ID:  Melody Mitchell, DOB 08-13-1941, MRN [id number]   Chief Complaint   Chief Complaint  Patient presents with   Follow-up    Pt states that she is doing well besides having knee pain.     History of Present Illness  Melody Mitchell is a 81 y.o. female with a history of GERD, arthritis, IDA, vertigo, HTN, HLD, and known small bowel AVMs presenting today for hospital follow-up.  Double-balloon enteroscopy performed at [facility] in 2011 with duodenal angiectasia's s/p APC therapy and angiectasia in the proximal jejunum status post APC therapy.  Last colonoscopy 06/29/2015: Redundant left colon, 4 polyps, large internal hemorrhoids, 2 polyps were tubular adenomas, 2 benign.  Advised repeat in 10 years.  EGD 03/13/2020: Low-grade narrowing Schatzki's ring, small hiatal hernia, normal duodenum, multiple nonbleeding angiectasia's in the jejunum treated with APC therapy, clip placed to prevent bleeding, spot tattoo and jejunum performed.  CT abdomen pelvis with contrast December 1007: Small umbilical hernia containing fat, no acute intra-abdominal or intrapelvic abnormalities.  Last seen in the clinic 05/23/22. She denied any melena, BRBPR, abdominal pain, heartburn/indigestion, dysphagia.  Reported her appetite could be better and states that it was difficult to cook for 1 person.  Primarily gets her protein source from chicken.  At the time she was drinking water, ginger ale, or juice and reported stable weight since February but had noted a 20 pound weight loss prior to that.  She was advised to increase her dietary intake of iron intake continue Nexium 40 mg daily.  Colonoscopy +/- EGD/enteroscopy was to be discussed with Dr. Abbey Chatters.  Seen in the hospital 06/01/2022 where she reported with chronic weakness for 1 to 2 weeks.  Her hemoglobin was  10 to be 8.3 on admission (baseline around 10).  1 day prior to admission her hemoglobin was 9.1 and she was sent home after given IV fluids.  FOBT was positive in the ED.  She had labs reviewed from 05/03/2022 with iron saturation 15%, ferritin 42, iron 41 and folate 6.6 in February 2023.  She reported weakness and fatigue some intermittent dizziness but denies shortness of breath.  She also denied any melena or BRBPR.  Noticed some vomiting the day prior but no abdominal pain.  She denied that she is not eating well for the prior couple weeks and noticed some early satiety and decreased appetite and was unsure about weight loss.  She reported daily bowel movement with needing to strain but denied any recent change in bowel habits.  She was not currently on any oral iron therapy she had received an IV iron infusion on 05/12/2022.  On Nexium 40 mg daily and denies GERD symptoms.  She was scheduled for EGD/enteroscopy on 06/02/2022.  EGD/enteroscopy 06/02/2022: Normal esophagus, small hiatal hernia, multiple small bowel AVMs from the mid duodenum to proximal jejunum with largest lesion measuring 5 mm without active bleeding and treated with APC therapy.  Decision was made to hold off on colonoscopy as long as her H&H remained stable.  Last performed 07/24/2022: Hemoglobin 9.8 (10.6 on 06/10/2022). Feeling weak at that time and dizzy. EKG normal. Hgb stable.    Today: Feeling congested over the last couple of days.   Has not noticed any melena or BRBPR. Denies abdominal pain, light headedness, weakness, early satiety, unintentional weight loss. Does have some  lack of appetite - has been going on for a while. She is not that interested in getting another colonoscopy. Has seldom toilet tissue hematochezia only if she has been straining. Has regular bowel movements, stools are sometimes soft and sometimes firm. Denies diarrhea. Does have to strain but usually not that often. Not having any issues with her reflux. Not  taking a PPI. Not able to remember much about her medications - believes she only takes elavil nightly.    Current Outpatient Medications  Medication Sig Dispense Refill   amitriptyline (ELAVIL) 25 MG tablet Take 25 mg by mouth at bedtime.     atorvastatin (LIPITOR) 40 MG tablet Take 40 mg by mouth daily. (Patient not taking: Reported on 08/01/2022)     escitalopram (LEXAPRO) 10 MG tablet Take 10 mg by mouth daily. (Patient not taking: Reported on 08/01/2022)     ezetimibe (ZETIA) 10 MG tablet Take 10 mg by mouth in the morning.  (Patient not taking: Reported on 08/01/2022)     pantoprazole (PROTONIX) 40 MG tablet Take 1 tablet (40 mg total) by mouth 2 (two) times daily. (Patient not taking: Reported on 08/01/2022) 60 tablet 0   potassium chloride SA (KLOR-CON) 20 MEQ tablet Take 20 mEq by mouth 2 (two) times daily. (Patient not taking: Reported on 08/01/2022)     No current facility-administered medications for this visit.    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 four colon polyps removed. No source  for change in bowel habits identified 3. Rectal bleeding due to large internal hemorrhoids    COLONOSCOPY W/ BIOPSIES  6 2010   Dr. Oneida Alar: Polypoid appearing lesion of the ascending colon, 3 mm sessile rectal polyp, small internal hemorrhoids. Pathology revealed polypoid mucosa, no adenomatous changes    Double-balloon enteroscopy, antegrade  April 2011   Dr. 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   ENTEROSCOPY N/A 06/02/2022   Procedure: ENTEROSCOPY;  Surgeon: Daneil Dolin, MD;  Location: [facility];  Service: Endoscopy;  Laterality: N/A;  with pediatric colonoscope   ESOPHAGOGASTRODUODENOSCOPY  04/2009   Dr. Oneida Alar: Patent Schatzki ring dilated with advancing the scope, patchy erythema in the antrum, 2 small AVMs in the duodenal bulb, 2 additional AVMs noted in the second portion the duodenum, mild gastritis on pathology   ESOPHAGOGASTRODUODENOSCOPY N/A 06/29/2015   SLF: 1. 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 small hiatal hernia 3. Mild non-erosive gastririts- NO obvious source for dyspepsia identified.    ESOPHAGOGASTRODUODENOSCOPY N/A 03/13/2020   Fields: Low-grade narrowing Schatzki ring, small hiatal hernia, multiple nonbleeding angiectasia's in the jejunum treated with APC.  Spot tattoo in jejunum at 90 cm from the incisors.   ESOPHAGOGASTRODUODENOSCOPY N/A 06/02/2022   Procedure: ESOPHAGOGASTRODUODENOSCOPY (  EGD);  Surgeon: Daneil Dolin, MD;  Location: [facility];  Service: Endoscopy;  Laterality: N/A;   EYE SURGERY     HOT HEMOSTASIS  06/02/2022   Procedure: HOT HEMOSTASIS (ARGON PLASMA COAGULATION/BICAP);  Surgeon: Daneil Dolin, MD;  Location: [facility];  Service: Endoscopy;;    Family History  Problem Relation Age of Onset   Heart failure Mother    Diabetes Sister    Heart attack Father    Colon cancer Neg Hx    Gastric cancer Neg Hx    Esophageal cancer Neg Hx     Allergies as of 08/01/2022 - Review Complete 08/01/2022  Allergen Reaction Noted   Latex Hives, Itching, and Rash 07/01/2009   Sulfa antibiotics Hives and Itching 11/21/2013   Other Hives 12/07/2020   Pravastatin sodium Other (See Comments) 12/07/2020    Social History   Socioeconomic History   Marital status: Divorced    Spouse name: Not on file    Number of children: 1   Years of education: Not on file   Highest education level: Not on file  Occupational History   Not on file  Tobacco Use   Smoking status: Former    Packs/day: 1.00    Years: 25.00    Total pack years: 25.00    Types: Cigarettes    Quit date: 09/14/2007    Years since quitting: 14.8   Smokeless tobacco: Never  Vaping Use   Vaping Use: Never used  Substance and Sexual Activity   Alcohol use: Not Currently    Comment: occasional/rare (Brandy every now and then)   Drug use: No   Sexual activity: Not Currently    Birth control/protection: Post-menopausal, Surgical  Other Topics Concern   Not on file  Social History Narrative   Not on file   Social Determinants of Health   Financial Resource Strain: Not on file  Food Insecurity: Not on file  Transportation Needs: Not on file  Physical Activity: Not on file  Stress: Not on file  Social Connections: Not on file     Review of Systems   Gen: Denies fever, chills, anorexia. Denies fatigue, weakness, weight loss.  CV: Denies chest pain, palpitations, syncope, peripheral edema, and claudication. Resp: Denies dyspnea at rest, cough, wheezing, coughing up blood, and pleurisy. GI: See HPI Derm: Denies rash, itching, dry skin Psych: Denies depression, anxiety, memory loss, confusion. No homicidal or suicidal ideation.  Heme: Denies bruising, bleeding, and enlarged lymph nodes.   Physical Exam   BP 138/78 (BP Location: Left Arm, Patient Position: Sitting, Cuff Size: Normal)   Pulse 76   Temp 97.8 F (36.6 C) (Temporal)   Ht '5\' 4"'$  (1.626 m)   Wt 136 lb 3.2 oz (61.8 kg)   SpO2 97%   BMI 23.38 kg/m   General:   Alert and oriented. No distress noted. Pleasant and cooperative.  Head:  Normocephalic and atraumatic. Eyes:  Conjuctiva clear without scleral icterus. Lungs:  Clear to auscultation bilaterally. No wheezes, rales, or rhonchi. No distress.  Heart:  S1, S2 present without murmurs appreciated.   Abdomen:  +BS, soft, non-tender and non-distended. No rebound or guarding. No HSM or masses noted. Rectal: deferred Msk:  Symmetrical without gross deformities. Normal posture. Extremities:  Without edema. Neurologic:  Alert and oriented x4 Psych:  Alert and cooperative. Normal mood and affect.   Assessment  Melody Mitchell is a 81 y.o. female with a history of GERD, arthritis, IDA, vertigo, HTN, HLD,  and known small bowel AVMs presenting today for hospital follow-up.  IDA: History of IDA, previously following with hematology.  She received IV iron infusions in May/June 2023 with her last 1 being 05/12/2022.  She had CT A/P with contrast December 2022 revealing small umbilical hernia containing fat, no intra-abdominal or intrapelvic abnormalities.  Her last colonoscopy was in 2016 noting large internal hemorrhoids, 2 adenomas, and redundant left colon.  Last EGD in 2021 with low-grade narrowing Schatzki's ring, small hernia, nonbleeding angiectasia in the jejunum treated with APC therapy.  She was recently in the hospital in July 2023 where she presented with weakness and had hemoglobin of 8 which was less than her baseline of 10, and positive Hemoccult stool.  She denied being on any oral iron therapy.  She underwent EGD/small bowel enteroscopy 06/02/2022 which revealed normal esophagus, small hiatal hernia, multiple small bowel AVMs from the mid duodenum to proximal jejunum with largest lesion measuring 5 mm without active bleeding and treated with APC therapy.  Most recent lab work completed 07/24/2022 during another ER visit for weakness.  Her hemoglobin was found to be stable at 9.8.  It was suspected that her dizziness was likely result of her chronic anemia and some possible orthostatic hypotension and CT head was recommended but patient declined.  She currently denies any melena or BRBPR.  Reports her lack appetite and early satiety has been stable.  She denies any weight loss.  Given her age and  resolution of recent symptoms and stable hemoglobin, we both discussed and agreed to not proceed with colonoscopy at this time.  We discussed ED return precautions and if symptoms reoccurred that we may need to proceed with colonoscopy, patient is in agreement.  GERD: Reportedly well controlled.  States she is not currently taking pantoprazole or Nexium.  She does endorse a little a lack of appetite and some early satiety, but reports this is baseline.  Weight appears to be stable via chart review.  She denies any dysphagia.  Advised that I would recommend for her to continue her pantoprazole 40 mg daily -Per review of medications, patient is unsure if she is taking this medication or not.  PLAN   CBC in 1 month Continue GERD diet Continue pantoprazole 40 mg daily. Follow up in 4 months.  Continue to monitor for overt GI bleeding.     Venetia Night, MSN, FNP-BC, AGACNP-BC [facility]

## 2022-08-11 ENCOUNTER — Inpatient Hospital Stay: Payer: Medicare Other | Attending: Hematology

## 2022-08-18 ENCOUNTER — Inpatient Hospital Stay: Payer: Medicare Other | Admitting: Physician Assistant

## 2022-08-18 ENCOUNTER — Inpatient Hospital Stay: Payer: Medicare Other | Attending: Hematology

## 2022-08-18 DIAGNOSIS — E559 Vitamin D deficiency, unspecified: Secondary | ICD-10-CM | POA: Diagnosis not present

## 2022-08-18 DIAGNOSIS — E538 Deficiency of other specified B group vitamins: Secondary | ICD-10-CM | POA: Insufficient documentation

## 2022-08-18 DIAGNOSIS — R634 Abnormal weight loss: Secondary | ICD-10-CM | POA: Insufficient documentation

## 2022-08-18 DIAGNOSIS — Z90710 Acquired absence of both cervix and uterus: Secondary | ICD-10-CM | POA: Insufficient documentation

## 2022-08-18 DIAGNOSIS — T452X1A Poisoning by vitamins, accidental (unintentional), initial encounter: Secondary | ICD-10-CM

## 2022-08-18 DIAGNOSIS — D508 Other iron deficiency anemias: Secondary | ICD-10-CM | POA: Insufficient documentation

## 2022-08-18 DIAGNOSIS — R630 Anorexia: Secondary | ICD-10-CM | POA: Diagnosis not present

## 2022-08-18 DIAGNOSIS — Z87891 Personal history of nicotine dependence: Secondary | ICD-10-CM | POA: Diagnosis not present

## 2022-08-18 DIAGNOSIS — I10 Essential (primary) hypertension: Secondary | ICD-10-CM | POA: Insufficient documentation

## 2022-08-18 DIAGNOSIS — D649 Anemia, unspecified: Secondary | ICD-10-CM

## 2022-08-18 DIAGNOSIS — D500 Iron deficiency anemia secondary to blood loss (chronic): Secondary | ICD-10-CM

## 2022-08-18 LAB — CBC WITH DIFFERENTIAL/PLATELET
Abs Immature Granulocytes: 0.01 10*3/uL (ref 0.00–0.07)
Basophils Absolute: 0 10*3/uL (ref 0.0–0.1)
Basophils Relative: 0 %
Eosinophils Absolute: 0.1 10*3/uL (ref 0.0–0.5)
Eosinophils Relative: 2 %
HCT: 33.6 % — ABNORMAL LOW (ref 36.0–46.0)
Hemoglobin: 10.4 g/dL — ABNORMAL LOW (ref 12.0–15.0)
Immature Granulocytes: 0 %
Lymphocytes Relative: 15 %
Lymphs Abs: 0.7 10*3/uL (ref 0.7–4.0)
MCH: 30.2 pg (ref 26.0–34.0)
MCHC: 31 g/dL (ref 30.0–36.0)
MCV: 97.7 fL (ref 80.0–100.0)
Monocytes Absolute: 0.4 10*3/uL (ref 0.1–1.0)
Monocytes Relative: 9 %
Neutro Abs: 3.6 10*3/uL (ref 1.7–7.7)
Neutrophils Relative %: 74 %
Platelets: 323 10*3/uL (ref 150–400)
RBC: 3.44 MIL/uL — ABNORMAL LOW (ref 3.87–5.11)
RDW: 14.5 % (ref 11.5–15.5)
WBC: 4.8 10*3/uL (ref 4.0–10.5)
nRBC: 0 % (ref 0.0–0.2)

## 2022-08-18 LAB — IRON AND TIBC
Iron: 39 ug/dL (ref 28–170)
Saturation Ratios: 13 % (ref 10.4–31.8)
TIBC: 312 ug/dL (ref 250–450)
UIBC: 273 ug/dL

## 2022-08-18 LAB — VITAMIN B12: Vitamin B-12: 562 pg/mL (ref 180–914)

## 2022-08-18 LAB — VITAMIN D 25 HYDROXY (VIT D DEFICIENCY, FRACTURES): Vit D, 25-Hydroxy: 84.04 ng/mL (ref 30–100)

## 2022-08-18 LAB — FERRITIN: Ferritin: 19 ng/mL (ref 11–307)

## 2022-08-20 ENCOUNTER — Other Ambulatory Visit: Payer: Self-pay

## 2022-08-20 ENCOUNTER — Encounter (HOSPITAL_COMMUNITY): Payer: Self-pay

## 2022-08-20 ENCOUNTER — Emergency Department (HOSPITAL_COMMUNITY)
Admission: EM | Admit: 2022-08-20 | Discharge: 2022-08-20 | Disposition: A | Discharge status: Home / Self Care | Payer: Medicare Other | Attending: Emergency Medicine | Admitting: Emergency Medicine

## 2022-08-20 DIAGNOSIS — R110 Nausea: Secondary | ICD-10-CM | POA: Insufficient documentation

## 2022-08-20 DIAGNOSIS — I10 Essential (primary) hypertension: Secondary | ICD-10-CM | POA: Insufficient documentation

## 2022-08-20 DIAGNOSIS — Z91040 Latex allergy status: Secondary | ICD-10-CM | POA: Diagnosis not present

## 2022-08-20 DIAGNOSIS — Z7982 Long term (current) use of aspirin: Secondary | ICD-10-CM | POA: Insufficient documentation

## 2022-08-20 DIAGNOSIS — R531 Weakness: Secondary | ICD-10-CM | POA: Insufficient documentation

## 2022-08-20 LAB — CBC WITH DIFFERENTIAL/PLATELET
Abs Immature Granulocytes: 0.01 10*3/uL (ref 0.00–0.07)
Basophils Absolute: 0 10*3/uL (ref 0.0–0.1)
Basophils Relative: 0 %
Eosinophils Absolute: 0.1 10*3/uL (ref 0.0–0.5)
Eosinophils Relative: 2 %
HCT: 32.5 % — ABNORMAL LOW (ref 36.0–46.0)
Hemoglobin: 9.9 g/dL — ABNORMAL LOW (ref 12.0–15.0)
Immature Granulocytes: 0 %
Lymphocytes Relative: 19 %
Lymphs Abs: 0.8 10*3/uL (ref 0.7–4.0)
MCH: 29.5 pg (ref 26.0–34.0)
MCHC: 30.5 g/dL (ref 30.0–36.0)
MCV: 96.7 fL (ref 80.0–100.0)
Monocytes Absolute: 0.4 10*3/uL (ref 0.1–1.0)
Monocytes Relative: 9 %
Neutro Abs: 3.1 10*3/uL (ref 1.7–7.7)
Neutrophils Relative %: 70 %
Platelets: 321 10*3/uL (ref 150–400)
RBC: 3.36 MIL/uL — ABNORMAL LOW (ref 3.87–5.11)
RDW: 14.5 % (ref 11.5–15.5)
WBC: 4.4 10*3/uL (ref 4.0–10.5)
nRBC: 0 % (ref 0.0–0.2)

## 2022-08-20 LAB — URINALYSIS, ROUTINE W REFLEX MICROSCOPIC
Bilirubin Urine: NEGATIVE
Glucose, UA: NEGATIVE mg/dL
Hgb urine dipstick: NEGATIVE
Ketones, ur: NEGATIVE mg/dL
Nitrite: NEGATIVE
Protein, ur: NEGATIVE mg/dL
Specific Gravity, Urine: 1.006 (ref 1.005–1.030)
pH: 7 (ref 5.0–8.0)

## 2022-08-20 LAB — COMPREHENSIVE METABOLIC PANEL
ALT: 12 U/L (ref 0–44)
AST: 20 U/L (ref 15–41)
Albumin: 4 g/dL (ref 3.5–5.0)
Alkaline Phosphatase: 61 U/L (ref 38–126)
Anion gap: 10 (ref 5–15)
BUN: 9 mg/dL (ref 8–23)
CO2: 27 mmol/L (ref 22–32)
Calcium: 9.5 mg/dL (ref 8.9–10.3)
Chloride: 103 mmol/L (ref 98–111)
Creatinine, Ser: 0.99 mg/dL (ref 0.44–1.00)
GFR, Estimated: 57 mL/min — ABNORMAL LOW (ref >60)
Glucose, Bld: 87 mg/dL (ref 70–99)
Potassium: 3.7 mmol/L (ref 3.5–5.1)
Sodium: 140 mmol/L (ref 135–145)
Total Bilirubin: 0.5 mg/dL (ref 0.3–1.2)
Total Protein: 7.5 g/dL (ref 6.5–8.1)

## 2022-08-20 LAB — LIPASE, BLOOD: Lipase: 55 U/L — ABNORMAL HIGH (ref 11–51)

## 2022-08-20 LAB — POC OCCULT BLOOD, ED: Fecal Occult Bld: NEGATIVE

## 2022-08-20 MED ORDER — ONDANSETRON HCL 4 MG/2ML IJ SOLN
4.0000 mg | Freq: Once | INTRAMUSCULAR | Status: AC
  Administered 2022-08-20: 4 mg via INTRAVENOUS
  Filled 2022-08-20: qty 2

## 2022-08-20 MED ORDER — SODIUM CHLORIDE 0.9 % IV BOLUS
1000.0000 mL | Freq: Once | INTRAVENOUS | Status: AC
  Administered 2022-08-20: 1000 mL via INTRAVENOUS

## 2022-08-20 MED ORDER — ONDANSETRON 4 MG PO TBDP: 4.0000 mg | ORAL_TABLET | Freq: Three times a day (TID) | ORAL | 0 refills | Status: DC | PRN

## 2022-08-20 NOTE — ED Provider Notes (Signed)
[facility] EMERGENCY DEPARTMENT Provider Note   CSN: [id number] Arrival date & time: 08/20/22  6269     History Chief Complaint  Patient presents with   Nausea    Melody Mitchell is a 81 y.o. female patient with history of GERD, hypertension, iron deficiency anemia, hyperlipidemia who presents to the emergency department with nausea and generalized weakness that started last night.  Chart review reveals that the patient was seen and evaluated here last month for similar symptoms.  She had some evidence of anemia but there was no other significant normalities and patient was ultimately discharged.  Patient denies any new or foreign foods that she tried last night.  She denies any vomiting.  She denies any abdominal pain, urinary symptoms, chest pain, shortness of breath, fever, chills.  Patient does mention that her stools have been a little more black over the last couple of days.  HPI     Home Medications Prior to Admission medications   Medication Sig Start Date End Date Taking? Authorizing Provider  ondansetron (ZOFRAN-ODT) 4 MG disintegrating tablet Take 1 tablet (4 mg total) by mouth every 8 (eight) hours as needed. 08/20/22  Yes Raul Del, Hailee Hollick M, PA-C  amitriptyline (ELAVIL) 25 MG tablet Take 25 mg by mouth at bedtime.    [provider]  aspirin EC 325 MG tablet Take 325 mg by mouth daily. 06/21/22   [provider]  atorvastatin (LIPITOR) 40 MG tablet SMARTSIG:1 Tablet(s) By Mouth Every Evening 08/10/22   [provider]  Cyanocobalamin (VITAMIN B-12 CR) 1000 MCG TBCR Take 1 tablet by mouth daily. 08/10/22   [provider]  escitalopram (LEXAPRO) 10 MG tablet Take 10 mg by mouth daily. 08/10/22   [provider]  ezetimibe (ZETIA) 10 MG tablet Take 10 mg by mouth daily. 08/10/22   [provider]  omega-3 fish oil (MAXEPA) 1000 MG CAPS capsule Take 1 capsule by mouth daily. 07/14/22   [provider]  pantoprazole  (PROTONIX) 40 MG tablet Take 40 mg by mouth 2 (two) times daily. 08/10/22   [provider]  potassium chloride SA (KLOR-CON M) 20 MEQ tablet Take 20 mEq by mouth 2 (two) times daily. 08/10/22   [provider]      Allergies    Latex, Sulfa antibiotics, Other, and Pravastatin sodium    Review of Systems   Review of Systems  All other systems reviewed and are negative.   Physical Exam Updated Vital Signs BP 129/83   Pulse 85   Temp 98.7 F (37.1 C) (Oral)   Resp 19   Ht '5\' 4"'$  (1.626 m)   Wt 61.8 kg   SpO2 95%   BMI 23.38 kg/m  Physical Exam Vitals and nursing note reviewed.  Constitutional:      General: She is not in acute distress.    Appearance: Normal appearance.  HENT:     Head: Normocephalic and atraumatic.  Eyes:     General:        Right eye: No discharge.        Left eye: No discharge.  Cardiovascular:     Comments: Regular rate and rhythm.  S1/S2 are distinct without any evidence of murmur, rubs, or gallops.  Radial pulses are 2+ bilaterally.  Dorsalis pedis pulses are 2+ bilaterally.  No evidence of pedal edema. Pulmonary:     Comments: Clear to auscultation bilaterally.  Normal effort.  No respiratory distress.  No evidence of wheezes, rales, or rhonchi heard  throughout. Abdominal:     General: Abdomen is flat. Bowel sounds are normal. There is no distension.     Tenderness: There is no abdominal tenderness. There is no guarding or rebound.  Musculoskeletal:        General: Normal range of motion.     Cervical back: Neck supple.  Skin:    General: Skin is warm and dry.     Findings: No rash.  Neurological:     General: No focal deficit present.     Mental Status: She is alert.  Psychiatric:        Mood and Affect: Mood normal.        Behavior: Behavior normal.     ED Results / Procedures / Treatments   Labs (all labs ordered are listed, but only abnormal results are displayed) Labs Reviewed  CBC WITH DIFFERENTIAL/PLATELET -  Abnormal; Notable for the following components:      Result Value   RBC 3.36 (*)    Hemoglobin 9.9 (*)    HCT 32.5 (*)    All other components within normal limits  COMPREHENSIVE METABOLIC PANEL - Abnormal; Notable for the following components:   GFR, Estimated 57 (*)    All other components within normal limits  LIPASE, BLOOD - Abnormal; Notable for the following components:   Lipase 55 (*)    All other components within normal limits  URINALYSIS, ROUTINE W REFLEX MICROSCOPIC - Abnormal; Notable for the following components:   Leukocytes,Ua MODERATE (*)    Bacteria, UA RARE (*)    All other components within normal limits  POC OCCULT BLOOD, ED    EKG EKG Interpretation  Date/Time:  Saturday August 20 2022 13:45:10 EDT Ventricular Rate:  79 PR Interval:  163 QRS Duration: 95 QT Interval:  363 QTC Calculation: 417 R Axis:   -19 Text Interpretation: Sinus rhythm Borderline left axis deviation Confirmed by Campbell Stall ([id number]) on 16/0/7371 1:47:44 PM  Radiology No results found.  Procedures Procedures    Medications Ordered in ED Medications  sodium chloride 0.9 % bolus 1,000 mL (0 mLs Intravenous Stopped 08/20/22 1306)  ondansetron (ZOFRAN) injection 4 mg (4 mg Intravenous Given 08/20/22 1035)    ED Course/ Medical Decision Making/ A&P Clinical Course as of 08/20/22 1436  Sat Aug 20, 2022  1201 POC occult blood, ED Negative. [CF]  1201 Lipase, blood(!) Slightly elevated but this seems to be at the patient's baseline. [CF]  1201 CBC with Differential(!) There is evidence of anemia but this seems to be at the patient's baseline. [CF]  1201 Comprehensive metabolic panel(!) Normal. [CF]  1202 Urinalysis, Routine w reflex microscopic Urine, Clean Catch(!) Normal. [CF]  1204 On reevaluation, patient asking for food.  She is feeling slightly better after fluids. [CF]  0626 On repeat evaluation, patient is much more alert and eating.  She is tolerated p.o. solids and  liquids without difficulty. [CF]    Clinical Course User Index [CF] Hendricks Limes, PA-C                           Medical Decision Making JERMANI PUND is a 81 y.o. female patient who presents to the emergency department today for further evaluation of nausea and generalized weakness. She is in no acute distress at this time. She is resting comfortably in the ER.  Vital signs are normal.  We will get some basic labs to evaluate.  Given her history of black  stools I will likely get a occult blood.  Do not feel that imaging is warranted at this time.  Patient feeling much better after fluids, antiemetics, and food.  Labs are all reassuring here today. Strict return precautions were discussed.  She is safe for discharge. I will have her follow up with her PCP for further evaluation.    Amount and/or Complexity of Data Reviewed Labs: ordered. Decision-making details documented in ED Course.  Risk Prescription drug management.   Final Clinical Impression(s) / ED Diagnoses Final diagnoses:  Nausea    Rx / DC Orders ED Discharge Orders          Ordered    ondansetron (ZOFRAN-ODT) 4 MG disintegrating tablet  Every 8 hours PRN        08/20/22 1433              Hendricks Limes, PA-C 53/20/23 3435    Campbell Stall P, DO 68/61/68 1800

## 2022-08-20 NOTE — ED Triage Notes (Signed)
Patient presents to ED via EMS , with c/o nausea and weakness that started last night. Denies vomiting or chest pain . No shortness of breath noted. EMS obtained CBG 111

## 2022-08-20 NOTE — ED Notes (Signed)
Patient sitting in bed, lunch provided. No c/o abdominal pain or nausea

## 2022-08-20 NOTE — Discharge Instructions (Addendum)
Please stick to a clear liquid diet for the next few days to let your bowels rest.  She will incorporate normal diet afterwards.  Life you to follow-up with your primary care doctor for further evaluation.  Please return to the emergency room if any worsening symptoms.  I have prescribed you Zofran for nausea.  Please take as prescribed.

## 2022-08-21 LAB — METHYLMALONIC ACID, SERUM: Methylmalonic Acid, Quantitative: 107 nmol/L (ref 0–378)

## 2022-08-25 NOTE — Progress Notes (Signed)
[place], [place] [address]   CLINIC:  Medical Oncology/Hematology  PCP:  [name], MD [address] [place] [address] [phone]   REASON FOR VISIT:  Follow-up for iron deficiency anemia   CURRENT THERAPY: Intermittent IV iron infusions (most recently 03/14/2022 & 03/21/2022)  INTERVAL HISTORY:  [name] 81 y.o. female returns for routine follow-up of her iron deficiency anemia.  She was last seen by Tarri Abernethy PA-C on 06/10/2022.  She was scheduled for 2 doses of IV [name], but once again did not show up for her second dose of [name], which had happened after her last appointment as well.  She received 1 dose of IV [name] on 06/10/2022.  She reports that she is still having intermittent black bowel movements, but is unsure of when her last episode of melena was.  She denies any other bleeding such as epistaxis, hematemesis, hematuria, or hematochezia.  She reports that her energy is fair. She has intermittent headaches.  She denies any pica, chest pain, dyspnea on exertion, lightheadedness, or syncope.  She had previously lost about 15 to 20 pounds in the course of 6 months, but her weight has been relatively stable since February 2023.   She is drinking Ensure once daily.  She does still note some decreased appetite.  She has 80% energy and 70% appetite. She endorses that she is maintaining a stable weight.   REVIEW OF SYSTEMS:    Review of Systems  Constitutional:  Positive for fatigue. Negative for chills, diaphoresis, fever and unexpected weight change.  HENT:   Negative for lump/mass and nosebleeds.   Eyes:  Negative for eye problems.  Respiratory:  Negative for cough, hemoptysis and shortness of breath.   Cardiovascular:  Negative for chest pain, leg swelling and palpitations.  Gastrointestinal:  Negative for abdominal pain, blood in stool, constipation, diarrhea, nausea and vomiting.  Genitourinary:  Negative  for hematuria.   Musculoskeletal:  Positive for arthralgias (knees).  Skin: Negative.   Neurological:  Positive for headaches. Negative for dizziness and light-headedness.  Hematological:  Does not bruise/bleed easily.  Psychiatric/Behavioral:  Positive for confusion and sleep disturbance. Negative for depression. The patient is not nervous/anxious.       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 four colon polyps removed. No source  for change in bowel habits identified 3. Rectal bleeding due to large internal hemorrhoids    COLONOSCOPY W/ BIOPSIES  6 2010   Dr. Oneida Alar: Polypoid appearing lesion of the ascending colon, 3 mm sessile rectal polyp, small internal hemorrhoids. Pathology revealed polypoid mucosa, no adenomatous changes   Double-balloon enteroscopy, antegrade  April 2011   Dr. 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   ENTEROSCOPY N/A 06/02/2022   Procedure: ENTEROSCOPY;  Surgeon: Daneil Dolin, MD;  [facility];  Service: Endoscopy;  Laterality: N/A;  with pediatric colonoscope   ESOPHAGOGASTRODUODENOSCOPY  04/2009   Dr. Oneida Alar: Patent Schatzki ring dilated with advancing the scope, patchy erythema in the antrum, 2 small AVMs in the duodenal bulb, 2 additional AVMs noted in the second portion the duodenum, mild  gastritis on pathology   ESOPHAGOGASTRODUODENOSCOPY N/A 06/29/2015   SLF: 1. 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 small hiatal hernia 3. Mild non-erosive gastririts- NO obvious source for dyspepsia identified.    ESOPHAGOGASTRODUODENOSCOPY N/A 03/13/2020   Fields: Low-grade narrowing Schatzki ring, small hiatal hernia, multiple nonbleeding angiectasia's in the jejunum treated with APC.  Spot tattoo in jejunum at 90 cm from the incisors.   ESOPHAGOGASTRODUODENOSCOPY N/A 06/02/2022   Procedure: ESOPHAGOGASTRODUODENOSCOPY (EGD);  Surgeon: Daneil Dolin, MD;  [facility];  Service: Endoscopy;  Laterality: N/A;   EYE SURGERY     HOT HEMOSTASIS  06/02/2022   Procedure: HOT HEMOSTASIS (ARGON PLASMA COAGULATION/BICAP);  Surgeon: Daneil Dolin, MD;  [facility];  Service: Endoscopy;;     SOCIAL HISTORY:  Social History   Socioeconomic History   Marital status: Divorced    Spouse name: Not on file   Number of children: 1   Years of education: Not on file   Highest education level: Not on file  Occupational History   Not on file  Tobacco Use   Smoking status: Former    Packs/day: 1.00    Years: 25.00    Total pack years: 25.00    Types: Cigarettes    Quit date: 09/14/2007    Years since quitting: 14.9   Smokeless tobacco: Never  Vaping Use   Vaping Use: Never used  Substance and Sexual Activity   Alcohol use: Not Currently    Comment: occasional/rare (Brandy every now and then)   Drug use: No   Sexual activity: Not Currently    Birth control/protection: Post-menopausal, Surgical  Other Topics Concern   Not on file  Social History Narrative   Not on file   Social Determinants of Health   Financial Resource Strain: Not on file  Food Insecurity: Not on file  Transportation Needs: Not on file  Physical Activity: Not on file  Stress: Not on file  Social Connections: Not on file  Intimate Partner Violence: Not on file    FAMILY HISTORY:   Family History  Problem Relation Age of Onset   Heart failure Mother    Diabetes Sister    Heart attack Father    Colon cancer Neg Hx    Gastric cancer Neg Hx    Esophageal cancer Neg Hx     CURRENT MEDICATIONS:  Outpatient Encounter Medications as of 08/26/2022  Medication Sig   amitriptyline (ELAVIL) 25 MG tablet Take 25 mg by mouth at bedtime.   aspirin EC 325 MG tablet Take 325 mg by mouth daily.   atorvastatin (LIPITOR) 40 MG tablet SMARTSIG:1 Tablet(s) By Mouth Every Evening   Cyanocobalamin (VITAMIN B-12 CR) 1000 MCG TBCR Take 1 tablet by mouth daily.   escitalopram (LEXAPRO) 10 MG tablet Take 10 mg by mouth daily.   ezetimibe (ZETIA) 10 MG tablet Take 10 mg by mouth daily.   omega-3 fish oil (MAXEPA) 1000 MG CAPS capsule Take 1 capsule by mouth daily.   ondansetron (ZOFRAN-ODT) 4 MG disintegrating tablet Take 1  tablet (4 mg total) by mouth every 8 (eight) hours as needed.   pantoprazole (PROTONIX) 40 MG tablet Take 40 mg by mouth 2 (two) times daily.   potassium chloride SA (KLOR-CON M) 20 MEQ tablet Take 20 mEq by mouth 2 (two) times daily.   No facility-administered encounter medications on file as of 08/26/2022.    ALLERGIES:  Allergies  Allergen Reactions   Latex Hives, Itching and Rash   Sulfa Antibiotics Hives and Itching    Other reaction(s): hives   Other Hives   Pravastatin Sodium Other (See Comments)     PHYSICAL EXAM:  ECOG PERFORMANCE STATUS: 1 - Symptomatic but completely ambulatory    There were no vitals filed for this visit. There were no vitals filed for this visit. Physical Exam Constitutional:      Appearance: Normal appearance. She is obese.  HENT:     Head: Normocephalic and atraumatic.     Mouth/Throat:     Mouth: Mucous membranes are moist.  Eyes:     Extraocular Movements: Extraocular movements intact.     Pupils: Pupils are equal, round, and reactive to light.  Cardiovascular:     Rate and Rhythm: Normal rate and regular rhythm.      Pulses: Normal pulses.     Heart sounds: Normal heart sounds.  Pulmonary:     Effort: Pulmonary effort is normal.     Breath sounds: Normal breath sounds.  Abdominal:     General: Bowel sounds are normal.     Palpations: Abdomen is soft.     Tenderness: There is no abdominal tenderness.  Musculoskeletal:        General: No swelling.     Right lower leg: No edema.     Left lower leg: No edema.  Lymphadenopathy:     Cervical: No cervical adenopathy.  Skin:    General: Skin is warm and dry.  Neurological:     General: No focal deficit present.     Mental Status: She is alert. She is disoriented.     Comments: Brief cognitive assessment - oriented to person and place, unable to recall the current year, suggested that it was possibly 2029.  Incorrectly named current president as "Chief Financial Officer."  Was able to immediately repeat the words "cat," "candle," and "box," but was only able to recall 1 out of the 3 words 60 seconds later.  Psychiatric:        Mood and Affect: Mood normal.        Behavior: Behavior normal.        Cognition and Memory: Cognition is impaired. Memory is impaired.      LABORATORY DATA:  I have reviewed the labs as listed.  CBC    Component Value Date/Time   WBC 4.4 08/20/2022 1036   RBC 3.36 (L) 08/20/2022 1036   HGB 9.9 (L) 08/20/2022 1036   HCT 32.5 (L) 08/20/2022 1036   PLT 321 08/20/2022 1036   MCV 96.7 08/20/2022 1036   MCH 29.5 08/20/2022 1036   MCHC 30.5 08/20/2022 1036   RDW 14.5 08/20/2022 1036   LYMPHSABS 0.8 08/20/2022 1036   MONOABS 0.4 08/20/2022 1036   EOSABS 0.1 08/20/2022 1036   BASOSABS 0.0 08/20/2022 1036      Latest Ref Rng & Units 08/20/2022   10:36 AM 07/24/2022    1:06 PM 06/10/2022   11:34 AM  CMP  Glucose 70 - 99 mg/dL 87  82  89   BUN 8 - 23 mg/dL  $'9  8  11   'B$ Creatinine 0.44 - 1.00 mg/dL 0.99  0.93  0.96   Sodium 135 - 145 mmol/L 140  140  138   Potassium 3.5 - 5.1 mmol/L 3.7  3.1  4.2   Chloride 98 - 111 mmol/L  103  108  107   CO2 22 - 32 mmol/L '27  24  25   '$ Calcium 8.9 - 10.3 mg/dL 9.5  8.8  9.2   Total Protein 6.5 - 8.1 g/dL 7.5   7.2   Total Bilirubin 0.3 - 1.2 mg/dL 0.5   0.3   Alkaline Phos 38 - 126 U/L 61   63   AST 15 - 41 U/L 20   19   ALT 0 - 44 U/L 12   13     DIAGNOSTIC IMAGING:  I have independently reviewed the relevant imaging and discussed with the patient.  ASSESSMENT & PLAN: 1.  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  She presented to ED on with acute on chronic weakness and fatigue, found to be hemoccult positive with Hgb 8.3 (dropped from 10.0 two weeks prior).  She received 1 unit PRBC.   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  Renal function normal for age. - Last [name] infusion on 06/10/2022 - Most recent labs (08/18/2022): Hgb 10.4, ferritin 19, iron saturation 13% - She continues to report dark stools, about once every 1-2 weeks    - She is symptomatic with fatigue       - She is followed by the GI providers at [facility] - PLAN: Recommend IV [name] x2 - Repeat labs and RTC in 2 months   - Follow-up with GI as scheduled   2.  Vitamin D deficiency, with toxicity: - She was instructed at prior appointments to stop taking vitamin D due to elevated levels, but she does not think that she did. - Vitamin D (12/20/2021) remained elevated at 142.55 - she was once again instructed to stop taking her vitamin D, but she has continued to take it.   -Prior labs (05/03/2022) showed significantly elevated vitamin D at 193.00, but labs today (06/02/2022) show vitamin D trending down to  143.62 - Most recent vitamin D (08/18/2022) shows normalized vitamin D 84.04 - PLAN: No vitamin D supplement at this time. - We will restart her on the lower dose if appropriate in the future.  Recheck vitamin D level in 3-6 months    3.  Borderline vitamin B12 deficiency: - Labs on 6/20/202 08/18/2022 3 show vitamin B12 at 691 with normal methylmalonic acid - She is not sure if she is taking her B12 supplement or not, but it is listed on her medication list from her pharmacy - PLAN: Encouraged her to continue vitamin B12 supplement.  We will check B12 and methylmalonic acid in 6 months (April 2024).    4.  Poor appetite and weight loss     - Reports decreased appetite for the past several months - Weight in September 2022 was 147 pounds, up to 159 pounds on 11/16/2021. - Weight today is 135 pounds, which is stable compared  to her last visit in July 2023 - PLAN: Patient instructed to continue drinking 1-2 Ensure/Boost beverages daily. - We will check weight at follow-up appointment in 2 months. - If further weight loss, will consider appetite stimulant, dietitian referral, and/or additional work-up.   PLAN SUMMARY & DISPOSITION:   [name] x2   Labs in 2 months RTC 1 week after labs  All questions were answered. The patient knows to call the clinic with any problems, questions or concerns.  Medical decision making: Moderate      Time spent on visit: I spent  20  minutes counseling the patient face to face. The total time spent in the appointment was  30  minutes and more than 50% was on counseling.   Harriett Rush, PA-C   08/26/22 10:07 AM

## 2022-08-26 ENCOUNTER — Inpatient Hospital Stay (HOSPITAL_BASED_OUTPATIENT_CLINIC_OR_DEPARTMENT_OTHER): Payer: Medicare Other | Admitting: Physician Assistant

## 2022-08-26 VITALS — BP 99/72 | HR 80 | Temp 98.8°F | Resp 16 | Wt 135.6 lb

## 2022-08-26 DIAGNOSIS — I10 Essential (primary) hypertension: Secondary | ICD-10-CM

## 2022-08-26 DIAGNOSIS — E559 Vitamin D deficiency, unspecified: Secondary | ICD-10-CM | POA: Diagnosis not present

## 2022-08-26 DIAGNOSIS — Z87891 Personal history of nicotine dependence: Secondary | ICD-10-CM

## 2022-08-26 DIAGNOSIS — Z90710 Acquired absence of both cervix and uterus: Secondary | ICD-10-CM

## 2022-08-26 DIAGNOSIS — R634 Abnormal weight loss: Secondary | ICD-10-CM

## 2022-08-26 DIAGNOSIS — T452X1A Poisoning by vitamins, accidental (unintentional), initial encounter: Secondary | ICD-10-CM

## 2022-08-26 DIAGNOSIS — D500 Iron deficiency anemia secondary to blood loss (chronic): Secondary | ICD-10-CM

## 2022-08-26 DIAGNOSIS — E538 Deficiency of other specified B group vitamins: Secondary | ICD-10-CM

## 2022-08-26 DIAGNOSIS — D508 Other iron deficiency anemias: Secondary | ICD-10-CM | POA: Diagnosis not present

## 2022-08-26 MED ORDER — VITAMIN B-12 ER 1000 MCG PO TBCR: 1.0000 | EXTENDED_RELEASE_TABLET | Freq: Every day | ORAL | 3 refills | Status: DC

## 2022-08-26 NOTE — Patient Instructions (Signed)
Melody Mitchell at [facility] Discharge Instructions  You were seen today by Tarri Abernethy PA-C for your iron deficiency anemia.  Your blood and iron levels are low, so we will schedule you for IV IRON x2 doses.  You should also continue to follow-up with your gastroenterologist [facility] doctor) for ongoing monitoring and management of your intestinal blood loss.     You should CONTINUE taking over-the-counter vitamin B12 supplement 1000 mcg daily  !!! Your vitamin D levels are too high.  As we have discussed at your previous appointments, you should NOT be taking any vitamin D supplement or multivitamin at this time.  It is extremely important that you STOP TAKING ANY VITAMIN D SUPPLEMENTS to avoid toxic effects of vitamin D overdose. !!!  You should CONTINUE drinking Ensure (or Boost) 1-2 drinks daily due to your decreased appetite and weight loss.    LABS: Return in 2 months for repeat labs  FOLLOW-UP APPOINTMENT: Office visit in 2 months, after labs  ** Thank you for trusting me with your healthcare!  I strive to provide all of my patients with quality care at each visit.  If you receive a survey for this visit, I would be so grateful to you for taking the time to provide feedback.  Thank you in advance!  ~ Melody Mitchell                   Dr. Derek Jack   &   Tarri Abernethy, PA-C   - - - - - - - - - - - - - - - - - -     Thank you for choosing [place] at [facility] to provide your oncology and hematology care.  To afford each patient quality time with our provider, please arrive at least 15 minutes before your scheduled appointment time.   If you have a lab appointment with the [facility] please come in thru the Main Entrance and check in at the main information desk.  You need to re-schedule your appointment should you arrive 10 or more minutes late.  We strive to give you quality time with our providers, and arriving  late affects you and other patients whose appointments are after yours.  Also, if you no show three or more times for appointments you may be dismissed from the clinic at the providers discretion.     Again, thank you for choosing [facility].  Our hope is that these requests will decrease the amount of time that you wait before being seen by our physicians.       _____________________________________________________________  Should you have questions after your visit to [facility], please contact our office at [phone] and follow the prompts.  Our office hours are 8:00 a.m. and 4:30 p.m. Monday - Friday.  Please note that voicemails left after 4:00 p.m. may not be returned until the following business day.  We are closed weekends and major holidays.  You do have access to a nurse 24-7, just call the main number to the clinic [phone] and do not press any options, hold on the line and a nurse will answer the phone.    For prescription refill requests, have your pharmacy contact our office and allow 72 hours.    Due to Covid, you will need to wear a mask upon entering the hospital. If you do not have a mask, a mask will be given to you  at the Main Entrance upon arrival. For doctor visits, patients may have 1 support person age 10 or older with them. For treatment visits, patients can not have anyone with them due to social distancing guidelines and our immunocompromised population.

## 2022-09-02 ENCOUNTER — Inpatient Hospital Stay: Payer: Medicare Other

## 2022-09-02 VITALS — BP 118/75 | HR 57 | Temp 98.5°F | Resp 18

## 2022-09-02 DIAGNOSIS — D649 Anemia, unspecified: Secondary | ICD-10-CM

## 2022-09-02 DIAGNOSIS — D508 Other iron deficiency anemias: Secondary | ICD-10-CM | POA: Diagnosis not present

## 2022-09-02 DIAGNOSIS — K921 Melena: Secondary | ICD-10-CM

## 2022-09-02 DIAGNOSIS — D500 Iron deficiency anemia secondary to blood loss (chronic): Secondary | ICD-10-CM

## 2022-09-02 LAB — CBC WITH DIFFERENTIAL/PLATELET
Abs Immature Granulocytes: 0 10*3/uL (ref 0.00–0.07)
Basophils Absolute: 0 10*3/uL (ref 0.0–0.1)
Basophils Relative: 0 %
Eosinophils Absolute: 0.1 10*3/uL (ref 0.0–0.5)
Eosinophils Relative: 4 %
HCT: 31 % — ABNORMAL LOW (ref 36.0–46.0)
Hemoglobin: 9.6 g/dL — ABNORMAL LOW (ref 12.0–15.0)
Immature Granulocytes: 0 %
Lymphocytes Relative: 22 %
Lymphs Abs: 0.7 10*3/uL (ref 0.7–4.0)
MCH: 29.3 pg (ref 26.0–34.0)
MCHC: 31 g/dL (ref 30.0–36.0)
MCV: 94.5 fL (ref 80.0–100.0)
Monocytes Absolute: 0.4 10*3/uL (ref 0.1–1.0)
Monocytes Relative: 11 %
Neutro Abs: 2.1 10*3/uL (ref 1.7–7.7)
Neutrophils Relative %: 63 %
Platelets: 333 10*3/uL (ref 150–400)
RBC: 3.28 MIL/uL — ABNORMAL LOW (ref 3.87–5.11)
RDW: 14 % (ref 11.5–15.5)
WBC: 3.3 10*3/uL — ABNORMAL LOW (ref 4.0–10.5)
nRBC: 0 % (ref 0.0–0.2)

## 2022-09-02 LAB — FERRITIN: Ferritin: 11 ng/mL (ref 11–307)

## 2022-09-02 LAB — IRON AND TIBC
Iron: 236 ug/dL — ABNORMAL HIGH (ref 28–170)
Saturation Ratios: 79 % — ABNORMAL HIGH (ref 10.4–31.8)
TIBC: 297 ug/dL (ref 250–450)
UIBC: 61 ug/dL

## 2022-09-02 LAB — SAMPLE TO BLOOD BANK

## 2022-09-02 MED ORDER — LORATADINE 10 MG PO TABS
10.0000 mg | ORAL_TABLET | Freq: Once | ORAL | Status: AC
  Administered 2022-09-02: 10 mg via ORAL
  Filled 2022-09-02: qty 1

## 2022-09-02 MED ORDER — SODIUM CHLORIDE 0.9 % IV SOLN: Freq: Once | INTRAVENOUS | Status: AC

## 2022-09-02 MED ORDER — ACETAMINOPHEN 325 MG PO TABS
650.0000 mg | ORAL_TABLET | Freq: Once | ORAL | Status: AC
  Administered 2022-09-02: 650 mg via ORAL
  Filled 2022-09-02: qty 2

## 2022-09-02 MED ORDER — SODIUM CHLORIDE 0.9 % IV SOLN
510.0000 mg | Freq: Once | INTRAVENOUS | Status: AC
  Administered 2022-09-02: 510 mg via INTRAVENOUS
  Filled 2022-09-02: qty 510

## 2022-09-02 NOTE — Progress Notes (Signed)
Patient presents today for iron infusion.  Patient is in satisfactory condition with no new complaints voiced.  Vital signs are stable.  We will proceed with infusion per provider orders.   Patient c/o dark stools today.  Dr. Lindi Adie notified.  We will draw labs today and patient was advised to follow up with GI.    Patient tolerated treatment well with no complaints voiced.  Patient left ambulatory in stable condition.  Vital signs stable at discharge.  Follow up as scheduled.

## 2022-09-02 NOTE — Patient Instructions (Signed)
[facility] [address]  Discharge Instructions: Thank you for choosing [facility] to provide your oncology and hematology care.  If you have a lab appointment with the Cancer Center, please come in thru the Main Entrance and check in at the main information desk.  Wear comfortable clothing and clothing appropriate for easy access to any Portacath or PICC line.   We strive to give you quality time with your provider. You may need to reschedule your appointment if you arrive late (15 or more minutes).  Arriving late affects you and other patients whose appointments are after yours.  Also, if you miss three or more appointments without notifying the office, you may be dismissed from the clinic at the provider's discretion.      For prescription refill requests, have your pharmacy contact our office and allow 72 hours for refills to be completed.     To help prevent nausea and vomiting after your treatment, we encourage you to take your nausea medication as directed.  BELOW ARE SYMPTOMS THAT SHOULD BE REPORTED IMMEDIATELY: *FEVER GREATER THAN 100.4 F (38 C) OR HIGHER *CHILLS OR SWEATING *NAUSEA AND VOMITING THAT IS NOT CONTROLLED WITH YOUR NAUSEA MEDICATION *UNUSUAL SHORTNESS OF BREATH *UNUSUAL BRUISING OR BLEEDING *URINARY PROBLEMS (pain or burning when urinating, or frequent urination) *BOWEL PROBLEMS (unusual diarrhea, constipation, pain near the anus) TENDERNESS IN MOUTH AND THROAT WITH OR WITHOUT PRESENCE OF ULCERS (sore throat, sores in mouth, or a toothache) UNUSUAL RASH, SWELLING OR PAIN  UNUSUAL VAGINAL DISCHARGE OR ITCHING   Items with * indicate a potential emergency and should be followed up as soon as possible or go to the Emergency Department if any problems should occur.  Please show the CHEMOTHERAPY ALERT CARD or IMMUNOTHERAPY ALERT CARD at check-in to the Emergency Department and triage nurse.  Should you have questions after your visit or need to  cancel or reschedule your appointment, please contact [facility] [place] [phone]  and follow the prompts.  Office hours are 8:00 a.m. to 4:30 p.m. Monday - Friday. Please note that voicemails left after 4:00 p.m. may not be returned until the following business day.  We are closed weekends and major holidays. You have access to a nurse at all times for urgent questions. Please call the main number to the clinic [phone] and follow the prompts.  For any non-urgent questions, you may also contact your provider using MyChart. We now offer e-Visits for anyone 18 and older to request care online for non-urgent symptoms. For details visit [email or web address].   Also download the MyChart app! Go to the app store, search "MyChart", open the app, select [facility], and log in with your MyChart username and password.  Masks are optional in the cancer centers. If you would like for your care team to wear a mask while they are taking care of you, please let them know. You may have one support person who is at least 83 years old accompany you for your appointments.  

## 2022-09-09 ENCOUNTER — Inpatient Hospital Stay: Payer: Medicare Other

## 2022-09-22 ENCOUNTER — Encounter: Payer: Self-pay | Admitting: Internal Medicine

## 2022-10-15 ENCOUNTER — Encounter (HOSPITAL_COMMUNITY): Payer: Self-pay

## 2022-10-15 ENCOUNTER — Other Ambulatory Visit: Payer: Self-pay

## 2022-10-15 ENCOUNTER — Emergency Department (HOSPITAL_COMMUNITY)
Admission: EM | Admit: 2022-10-15 | Discharge: 2022-10-15 | Disposition: A | Discharge status: Home / Self Care | Payer: Medicare Other | Attending: Emergency Medicine | Admitting: Emergency Medicine

## 2022-10-15 DIAGNOSIS — I10 Essential (primary) hypertension: Secondary | ICD-10-CM | POA: Insufficient documentation

## 2022-10-15 DIAGNOSIS — R42 Dizziness and giddiness: Secondary | ICD-10-CM

## 2022-10-15 DIAGNOSIS — Z7982 Long term (current) use of aspirin: Secondary | ICD-10-CM | POA: Diagnosis not present

## 2022-10-15 DIAGNOSIS — Z91040 Latex allergy status: Secondary | ICD-10-CM | POA: Insufficient documentation

## 2022-10-15 DIAGNOSIS — Z79899 Other long term (current) drug therapy: Secondary | ICD-10-CM | POA: Insufficient documentation

## 2022-10-15 DIAGNOSIS — R55 Syncope and collapse: Secondary | ICD-10-CM

## 2022-10-15 LAB — BASIC METABOLIC PANEL
Anion gap: 6 (ref 5–15)
BUN: 12 mg/dL (ref 8–23)
CO2: 24 mmol/L (ref 22–32)
Calcium: 8.6 mg/dL — ABNORMAL LOW (ref 8.9–10.3)
Chloride: 112 mmol/L — ABNORMAL HIGH (ref 98–111)
Creatinine, Ser: 0.95 mg/dL (ref 0.44–1.00)
GFR, Estimated: 59 mL/min — ABNORMAL LOW (ref >60)
Glucose, Bld: 91 mg/dL (ref 70–99)
Potassium: 4 mmol/L (ref 3.5–5.1)
Sodium: 142 mmol/L (ref 135–145)

## 2022-10-15 LAB — CBC WITH DIFFERENTIAL/PLATELET
Abs Immature Granulocytes: 0.02 10*3/uL (ref 0.00–0.07)
Basophils Absolute: 0 10*3/uL (ref 0.0–0.1)
Basophils Relative: 0 %
Eosinophils Absolute: 0.1 10*3/uL (ref 0.0–0.5)
Eosinophils Relative: 1 %
HCT: 31.5 % — ABNORMAL LOW (ref 36.0–46.0)
Hemoglobin: 9.7 g/dL — ABNORMAL LOW (ref 12.0–15.0)
Immature Granulocytes: 0 %
Lymphocytes Relative: 12 %
Lymphs Abs: 0.8 10*3/uL (ref 0.7–4.0)
MCH: 29.5 pg (ref 26.0–34.0)
MCHC: 30.8 g/dL (ref 30.0–36.0)
MCV: 95.7 fL (ref 80.0–100.0)
Monocytes Absolute: 0.4 10*3/uL (ref 0.1–1.0)
Monocytes Relative: 7 %
Neutro Abs: 5.1 10*3/uL (ref 1.7–7.7)
Neutrophils Relative %: 80 %
Platelets: 271 10*3/uL (ref 150–400)
RBC: 3.29 MIL/uL — ABNORMAL LOW (ref 3.87–5.11)
RDW: 15.5 % (ref 11.5–15.5)
WBC: 6.4 10*3/uL (ref 4.0–10.5)
nRBC: 0 % (ref 0.0–0.2)

## 2022-10-15 LAB — PROTIME-INR
INR: 1 (ref 0.8–1.2)
Prothrombin Time: 13.5 seconds (ref 11.4–15.2)

## 2022-10-15 LAB — CBG MONITORING, ED: Glucose-Capillary: 136 mg/dL — ABNORMAL HIGH (ref 70–99)

## 2022-10-15 MED ORDER — SODIUM CHLORIDE 0.9 % IV BOLUS
1000.0000 mL | Freq: Once | INTRAVENOUS | Status: AC
  Administered 2022-10-15: 1000 mL via INTRAVENOUS

## 2022-10-15 NOTE — ED Notes (Addendum)
Dr. Kathrynn Humble made aware of pt's symptoms. Pt changed her mind and reports she woke up at 0700 and dizziness started at 0800. Generalized weakness.

## 2022-10-15 NOTE — ED Triage Notes (Signed)
Patients to ED via [facility], patient reports she woke up around 6 am and started having dizziness around 7 am. Patient states "she feels like she is going to pass out". Denies chest pain or shortness of breath. CBG obtained from EMS 130

## 2022-10-15 NOTE — ED Notes (Signed)
Gave pt warm blanket. 

## 2022-10-15 NOTE — ED Provider Notes (Signed)
[facility] Provider Note   CSN: [id number] Arrival date & time: 10/15/22  1012     History  Chief Complaint  Patient presents with   Dizziness    Melody Mitchell is a 81 y.o. female.  HPI     81 year old patient comes in with chief complaint of dizziness. Patient has past medical history of bronchitis, hypertension, hyperlipidemia.  She does not have any coronary artery disease or any history of stroke.  Patient has history of chronic dizziness.  She states that today, her dizziness was severe.  She woke up feeling well, had a coffee.  However when she went back to her room and was sitting, she started getting dizzy described as feeling like she was going to faint.  She did not have any associated chest pain, palpitations, shortness of breath.  Symptoms have resolved over time, now patient feels back to being baseline normal.  She has seen cardiologist for this complaint in the past and has had negative workup.  Home Medications Prior to Admission medications   Medication Sig Start Date End Date Taking? Authorizing Provider  amitriptyline (ELAVIL) 25 MG tablet Take 25 mg by mouth at bedtime.    [provider]  aspirin EC 325 MG tablet Take 325 mg by mouth daily. 06/21/22   [provider]  atorvastatin (LIPITOR) 40 MG tablet SMARTSIG:1 Tablet(s) By Mouth Every Evening 08/10/22   [provider]  Cyanocobalamin (VITAMIN B-12 CR) 1000 MCG TBCR Take 1 tablet (1,000 mcg total) by mouth daily. 08/26/22   Harriett Rush, PA-C  escitalopram (LEXAPRO) 10 MG tablet Take 10 mg by mouth daily. 08/10/22   [provider]  ezetimibe (ZETIA) 10 MG tablet Take 10 mg by mouth daily. 08/10/22   [provider]  omega-3 fish oil (MAXEPA) 1000 MG CAPS capsule Take 1 capsule by mouth daily. 07/14/22   [provider]  ondansetron (ZOFRAN-ODT) 4 MG disintegrating tablet Take 1 tablet (4 mg total) by mouth every 8 (eight) hours  as needed. 08/20/22   Myna Bright M, PA-C  pantoprazole (PROTONIX) 40 MG tablet Take 40 mg by mouth 2 (two) times daily. 08/10/22   [provider]  potassium chloride SA (KLOR-CON M) 20 MEQ tablet Take 20 mEq by mouth 2 (two) times daily. 08/10/22   [provider]      Allergies    Latex, Sulfa antibiotics, Other, and Pravastatin sodium    Review of Systems   Review of Systems  All other systems reviewed and are negative.   Physical Exam Updated Vital Signs BP 122/85   Pulse 89   Temp 98.4 F (36.9 C) (Oral)   Resp 17   Ht '5\' 4"'$  (1.626 m)   Wt 63.1 kg   SpO2 98%   BMI 23.88 kg/m  Physical Exam Vitals and nursing note reviewed.  Constitutional:      Appearance: She is well-developed.  HENT:     Head: Atraumatic.  Eyes:     Extraocular Movements: Extraocular movements intact.     Pupils: Pupils are equal, round, and reactive to light.     Comments: No nystagmus  Cardiovascular:     Rate and Rhythm: Normal rate.  Pulmonary:     Effort: Pulmonary effort is normal.  Musculoskeletal:     Cervical back: Normal range of motion and neck supple.  Skin:    General: Skin is warm and dry.  Neurological:     Mental Status: She is alert  and oriented to person, place, and time.     Cranial Nerves: No cranial nerve deficit.     Sensory: No sensory deficit.     Motor: No weakness.     Coordination: Coordination normal.     ED Results / Procedures / Treatments   Labs (all labs ordered are listed, but only abnormal results are displayed) Labs Reviewed  CBC WITH DIFFERENTIAL/PLATELET - Abnormal; Notable for the following components:      Result Value   RBC 3.29 (*)    Hemoglobin 9.7 (*)    HCT 31.5 (*)    All other components within normal limits  BASIC METABOLIC PANEL - Abnormal; Notable for the following components:   Chloride 112 (*)    Calcium 8.6 (*)    GFR, Estimated 59 (*)    All other components within normal limits  CBG MONITORING, ED -  Abnormal; Notable for the following components:   Glucose-Capillary 136 (*)    All other components within normal limits  PROTIME-INR    EKG EKG Interpretation  Date/Time:  Saturday October 15 2022 10:37:31 EST Ventricular Rate:  97 PR Interval:  157 QRS Duration: 92 QT Interval:  356 QTC Calculation: 453 R Axis:   -32 Text Interpretation: Sinus rhythm Atrial premature complex Left axis deviation Low voltage, precordial leads Borderline T abnormalities, anterior leads No acute changes No significant change since last tracing Confirmed by Varney Biles [phone]) on 10/15/2022 1:58:07 PM  Radiology No results found.  Procedures Procedures    Medications Ordered in ED Medications  sodium chloride 0.9 % bolus 1,000 mL (0 mLs Intravenous Stopped 10/15/22 1333)    ED Course/ Medical Decision Making/ A&P                           Medical Decision Making Amount and/or Complexity of Data Reviewed Labs: ordered. ECG/medicine tests: ordered.   81 year old patient comes in with chief complaint of dizziness and near fainting.  She has history of hypertension, hyperlipidemia.  She has history of chronic dizziness.  Patient neuroexam is nonfocal.  No dysmetria, no nystagmus.  No chest pain, palpitations, pleurisy.  No signs of DVT.  Differential diagnosis that was considered includes Orthostatic hypotension Stroke Vertebral artery dissection/stenosis Dysrhythmia PE Vasovagal/neurocardiogenic syncope Aortic stenosis Valvular disorder/Cardiomyopathy Anemia    I have reviewed patient's chart including Care Everywhere.  Patient was seen by [facility] cardiology recently for dizziness.  At that time there was discussion about recurrent dizziness, and to evaluate for cardiac cause.  Patient had an echocardiogram and also Holter monitor placed which were negative for any concerning findings. Patient now feeling back to baseline.  Her EKG is not showing any concerning findings.  Basic labs,  orthostatics ordered.  IV fluid given.  At the time of reassessment around 1:30 PM, patient stated that she is feeling better and wants to go home.  She is stable for discharge.  I reviewed patient's telemetry monitoring.  She has not had any tacky dysrhythmias.   Final Clinical Impression(s) / ED Diagnoses Final diagnoses:  Dizziness  Near syncope    Rx / DC Orders ED Discharge Orders     None         Varney Biles, MD 10/15/22 1400

## 2022-10-18 ENCOUNTER — Encounter (HOSPITAL_COMMUNITY): Payer: Self-pay | Admitting: Emergency Medicine

## 2022-10-18 ENCOUNTER — Emergency Department (HOSPITAL_COMMUNITY)
Admission: EM | Admit: 2022-10-18 | Discharge: 2022-10-18 | Disposition: A | Discharge status: Home / Self Care | Payer: Medicare Other | Attending: Emergency Medicine | Admitting: Emergency Medicine

## 2022-10-18 ENCOUNTER — Other Ambulatory Visit: Payer: Self-pay

## 2022-10-18 DIAGNOSIS — I10 Essential (primary) hypertension: Secondary | ICD-10-CM | POA: Insufficient documentation

## 2022-10-18 DIAGNOSIS — R42 Dizziness and giddiness: Secondary | ICD-10-CM

## 2022-10-18 DIAGNOSIS — Z91040 Latex allergy status: Secondary | ICD-10-CM | POA: Insufficient documentation

## 2022-10-18 DIAGNOSIS — D649 Anemia, unspecified: Secondary | ICD-10-CM | POA: Insufficient documentation

## 2022-10-18 DIAGNOSIS — Z20822 Contact with and (suspected) exposure to covid-19: Secondary | ICD-10-CM | POA: Insufficient documentation

## 2022-10-18 DIAGNOSIS — R600 Localized edema: Secondary | ICD-10-CM | POA: Diagnosis not present

## 2022-10-18 DIAGNOSIS — R531 Weakness: Secondary | ICD-10-CM

## 2022-10-18 LAB — URINALYSIS, ROUTINE W REFLEX MICROSCOPIC
Bilirubin Urine: NEGATIVE
Glucose, UA: NEGATIVE mg/dL
Hgb urine dipstick: NEGATIVE
Ketones, ur: NEGATIVE mg/dL
Nitrite: NEGATIVE
Protein, ur: NEGATIVE mg/dL
Specific Gravity, Urine: 1.014 (ref 1.005–1.030)
pH: 5 (ref 5.0–8.0)

## 2022-10-18 LAB — RESP PANEL BY RT-PCR (FLU A&B, COVID) ARPGX2
Influenza A by PCR: NEGATIVE
Influenza B by PCR: NEGATIVE
SARS Coronavirus 2 by RT PCR: NEGATIVE

## 2022-10-18 LAB — CBC
HCT: 31.5 % — ABNORMAL LOW (ref 36.0–46.0)
Hemoglobin: 9.7 g/dL — ABNORMAL LOW (ref 12.0–15.0)
MCH: 29.4 pg (ref 26.0–34.0)
MCHC: 30.8 g/dL (ref 30.0–36.0)
MCV: 95.5 fL (ref 80.0–100.0)
Platelets: 321 10*3/uL (ref 150–400)
RBC: 3.3 MIL/uL — ABNORMAL LOW (ref 3.87–5.11)
RDW: 15.9 % — ABNORMAL HIGH (ref 11.5–15.5)
WBC: 5.1 10*3/uL (ref 4.0–10.5)
nRBC: 0 % (ref 0.0–0.2)

## 2022-10-18 LAB — BASIC METABOLIC PANEL
Anion gap: 8 (ref 5–15)
BUN: 8 mg/dL (ref 8–23)
CO2: 22 mmol/L (ref 22–32)
Calcium: 9 mg/dL (ref 8.9–10.3)
Chloride: 110 mmol/L (ref 98–111)
Creatinine, Ser: 1.02 mg/dL — ABNORMAL HIGH (ref 0.44–1.00)
GFR, Estimated: 55 mL/min — ABNORMAL LOW (ref >60)
Glucose, Bld: 72 mg/dL (ref 70–99)
Potassium: 4 mmol/L (ref 3.5–5.1)
Sodium: 140 mmol/L (ref 135–145)

## 2022-10-18 LAB — CBG MONITORING, ED: Glucose-Capillary: 77 mg/dL (ref 70–99)

## 2022-10-18 MED ORDER — SODIUM CHLORIDE 0.9 % IV BOLUS
500.0000 mL | Freq: Once | INTRAVENOUS | Status: AC
  Administered 2022-10-18: 500 mL via INTRAVENOUS

## 2022-10-18 MED ORDER — ACETAMINOPHEN 325 MG PO TABS
650.0000 mg | ORAL_TABLET | Freq: Once | ORAL | Status: AC
  Administered 2022-10-18: 650 mg via ORAL
  Filled 2022-10-18: qty 2

## 2022-10-18 NOTE — ED Provider Notes (Signed)
[facility] EMERGENCY DEPARTMENT Provider Note   CSN: [id number] Arrival date & time: 10/18/22  1239     History  Chief Complaint  Patient presents with   Weakness   Dizziness    Melody Mitchell is a 81 y.o. female.   Weakness Associated symptoms: dizziness   Dizziness Associated symptoms: weakness   Patient presents with lightheadedness and dizziness.  Feels as if she could pass out.  Feels generally weak.  Not vertiginous.  Seen 3 days ago for same.  Mild tachypnea initially.  No fevers.  No coughing.  States she has been eating and drinking less.  Lab work from 3 days ago reassuring.  Reviewed note.    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 Authorizing Provider  amitriptyline (ELAVIL) 25 MG tablet Take 25 mg by mouth at bedtime.    [provider]  aspirin EC 325 MG tablet Take 325 mg by mouth daily. 06/21/22   [provider]  atorvastatin (LIPITOR) 40 MG tablet SMARTSIG:1 Tablet(s) By Mouth Every Evening 08/10/22   [provider]  Cyanocobalamin (VITAMIN B-12 CR) 1000 MCG TBCR Take 1 tablet (1,000 mcg total) by mouth daily. 08/26/22   Harriett Rush, PA-C  escitalopram (LEXAPRO) 10 MG tablet Take 10 mg by mouth daily. 08/10/22   [provider]  ezetimibe (ZETIA) 10 MG tablet Take 10 mg by mouth daily. 08/10/22   [provider]  omega-3 fish oil (MAXEPA) 1000 MG CAPS capsule Take 1 capsule by mouth daily. 07/14/22   [provider]  ondansetron  (ZOFRAN-ODT) 4 MG disintegrating tablet Take 1 tablet (4 mg total) by mouth every 8 (eight) hours as needed. 08/20/22   Myna Bright M, PA-C  pantoprazole (PROTONIX) 40 MG tablet Take 40 mg by mouth 2 (two) times daily. 08/10/22   [provider]  potassium chloride SA (KLOR-CON M) 20 MEQ tablet Take 20 mEq by mouth 2 (two) times daily. 08/10/22   [provider]      Allergies    Latex, Sulfa antibiotics, Other, and Pravastatin sodium    Review of Systems   Review of Systems  Neurological:  Positive for dizziness and weakness.    Physical Exam Updated Vital Signs BP 124/86   Pulse 84   Temp 98.6 F (37 C) (Oral)   Resp 20   SpO2 98%  Physical Exam Vitals and nursing note reviewed.  Cardiovascular:     Rate and Rhythm: Regular rhythm.  Pulmonary:     Breath sounds: No wheezing or rhonchi.  Abdominal:     Tenderness: There is no abdominal tenderness.  Musculoskeletal:     Cervical back: Neck supple.     Comments: Mild bilateral lower extremity pitting edema.  Skin:    General: Skin is warm.     Capillary Refill: Capillary refill takes less than 2 seconds.  Neurological:     Mental Status: She is alert  and oriented to person, place, and time.     ED Results / Procedures / Treatments   Labs (all labs ordered are listed, but only abnormal results are displayed) Labs Reviewed  BASIC METABOLIC PANEL - Abnormal; Notable for the following components:      Result Value   Creatinine, Ser 1.02 (*)    GFR, Estimated 55 (*)    All other components within normal limits  CBC - Abnormal; Notable for the following components:   RBC 3.30 (*)    Hemoglobin 9.7 (*)    HCT 31.5 (*)    RDW 15.9 (*)    All other components within normal limits  URINALYSIS, ROUTINE W REFLEX MICROSCOPIC  CBG MONITORING, ED    EKG None  Radiology No results found.  Procedures Procedures    Medications Ordered in ED Medications  acetaminophen (TYLENOL) tablet 650 mg (has  no administration in time range)  sodium chloride 0.9 % bolus 500 mL (500 mLs Intravenous New Bag/Given 10/18/22 1513)    ED Course/ Medical Decision Making/ A&P                           Medical Decision Making Amount and/or Complexity of Data Reviewed Labs: ordered.  Risk OTC drugs.   Patient with lightheadedness.  Multiple prior eval's for same.  Recently seen for same.  Reviewed note from them.  Hemoglobin 9.7 which appears to be at baseline.  1.02.  Which is mildly increased from prior.  Will check urinalysis which had not been done.  Will give fluid bolus and check orthostatics.  Will also add flu and COVID testing which had not been done.  Appears somewhat chronic issue and hopefully should be able to discharge home.  Care turned over to Dr. Sabra Heck.        Final Clinical Impression(s) / ED Diagnoses Final diagnoses:  None    Rx / DC Orders ED Discharge Orders     None         Davonna Belling, MD 10/18/22 1529

## 2022-10-18 NOTE — ED Notes (Signed)
EKG given to edp and aware or rr 40

## 2022-10-18 NOTE — Discharge Instructions (Signed)
Your testing today does not show any signs of heart problems or infections, your vital signs have look great, your heart rate and blood pressure are reassuring.  He will need to see your doctor within 2 or 3 days for recheck, come back to the ER for worsening symptoms.  Please make sure you are drinking plenty of clear liquids

## 2022-10-18 NOTE — ED Triage Notes (Signed)
Pt here 3 days ago for same. Pt c/o gen weakness and dizziness with movement. Pt a/o. Color wnl. Pt denies cp or sob. Pt does have tachypnea and labored breathing. Encouraged to slow deep breathe

## 2022-10-18 NOTE — ED Provider Notes (Signed)
This patient was accepted at change of shift, vital signs have been reassuring and normal, urinalysis, flu and COVID testing, CBC and metabolic panel are all reassuring, including her mild chronic anemia.  She is not hypoglycemic, her EKG is unremarkable, she is well-appearing and stable for discharge and agreeable to the plan   Noemi Chapel, MD 10/18/22 1821

## 2022-10-21 ENCOUNTER — Inpatient Hospital Stay: Payer: Medicaid Other | Attending: Hematology

## 2022-10-27 ENCOUNTER — Ambulatory Visit: Payer: Medicare Other | Admitting: Physician Assistant

## 2022-10-29 ENCOUNTER — Encounter (HOSPITAL_COMMUNITY): Payer: Self-pay

## 2022-10-29 ENCOUNTER — Other Ambulatory Visit: Payer: Self-pay

## 2022-10-29 ENCOUNTER — Emergency Department (HOSPITAL_COMMUNITY): Payer: Medicare Other

## 2022-10-29 ENCOUNTER — Emergency Department (HOSPITAL_COMMUNITY)
Admission: EM | Admit: 2022-10-29 | Discharge: 2022-10-29 | Disposition: A | Discharge status: Home / Self Care | Payer: Medicare Other | Attending: Emergency Medicine | Admitting: Emergency Medicine

## 2022-10-29 DIAGNOSIS — Z20822 Contact with and (suspected) exposure to covid-19: Secondary | ICD-10-CM | POA: Diagnosis not present

## 2022-10-29 DIAGNOSIS — Z79899 Other long term (current) drug therapy: Secondary | ICD-10-CM | POA: Insufficient documentation

## 2022-10-29 DIAGNOSIS — R42 Dizziness and giddiness: Secondary | ICD-10-CM | POA: Diagnosis not present

## 2022-10-29 DIAGNOSIS — Z7982 Long term (current) use of aspirin: Secondary | ICD-10-CM | POA: Diagnosis not present

## 2022-10-29 DIAGNOSIS — Z91040 Latex allergy status: Secondary | ICD-10-CM | POA: Insufficient documentation

## 2022-10-29 DIAGNOSIS — R531 Weakness: Secondary | ICD-10-CM | POA: Diagnosis present

## 2022-10-29 LAB — COMPREHENSIVE METABOLIC PANEL
ALT: 10 U/L (ref 0–44)
AST: 19 U/L (ref 15–41)
Albumin: 3.8 g/dL (ref 3.5–5.0)
Alkaline Phosphatase: 57 U/L (ref 38–126)
Anion gap: 6 (ref 5–15)
BUN: 12 mg/dL (ref 8–23)
CO2: 27 mmol/L (ref 22–32)
Calcium: 9.4 mg/dL (ref 8.9–10.3)
Chloride: 105 mmol/L (ref 98–111)
Creatinine, Ser: 0.84 mg/dL (ref 0.44–1.00)
GFR, Estimated: >60 mL/min (ref >60)
Glucose, Bld: 79 mg/dL (ref 70–99)
Potassium: 3.6 mmol/L (ref 3.5–5.1)
Sodium: 138 mmol/L (ref 135–145)
Total Bilirubin: 0.3 mg/dL (ref 0.3–1.2)
Total Protein: 7 g/dL (ref 6.5–8.1)

## 2022-10-29 LAB — CBC WITH DIFFERENTIAL/PLATELET
Abs Immature Granulocytes: 0.02 10*3/uL (ref 0.00–0.07)
Basophils Absolute: 0 10*3/uL (ref 0.0–0.1)
Basophils Relative: 0 %
Eosinophils Absolute: 0 10*3/uL (ref 0.0–0.5)
Eosinophils Relative: 0 %
HCT: 29.7 % — ABNORMAL LOW (ref 36.0–46.0)
Hemoglobin: 9.1 g/dL — ABNORMAL LOW (ref 12.0–15.0)
Immature Granulocytes: 0 %
Lymphocytes Relative: 10 %
Lymphs Abs: 0.7 10*3/uL (ref 0.7–4.0)
MCH: 29.2 pg (ref 26.0–34.0)
MCHC: 30.6 g/dL (ref 30.0–36.0)
MCV: 95.2 fL (ref 80.0–100.0)
Monocytes Absolute: 0.4 10*3/uL (ref 0.1–1.0)
Monocytes Relative: 5 %
Neutro Abs: 6.2 10*3/uL (ref 1.7–7.7)
Neutrophils Relative %: 85 %
Platelets: 329 10*3/uL (ref 150–400)
RBC: 3.12 MIL/uL — ABNORMAL LOW (ref 3.87–5.11)
RDW: 15.1 % (ref 11.5–15.5)
WBC: 7.3 10*3/uL (ref 4.0–10.5)
nRBC: 0 % (ref 0.0–0.2)

## 2022-10-29 LAB — CBG MONITORING, ED: Glucose-Capillary: 123 mg/dL — ABNORMAL HIGH (ref 70–99)

## 2022-10-29 LAB — RESP PANEL BY RT-PCR (RSV, FLU A&B, COVID)  RVPGX2
Influenza A by PCR: NEGATIVE
Influenza B by PCR: NEGATIVE
Resp Syncytial Virus by PCR: NEGATIVE
SARS Coronavirus 2 by RT PCR: NEGATIVE

## 2022-10-29 LAB — ETHANOL: Alcohol, Ethyl (B): <10 mg/dL (ref <10)

## 2022-10-29 NOTE — ED Provider Notes (Signed)
[facility] EMERGENCY DEPARTMENT Provider Note   CSN: [id number] Arrival date & time: 10/29/22  1211     History  Chief Complaint  Patient presents with   Dizziness    Melody Mitchell is a 81 y.o. female.  HPI Patient presents with concern of weakness.  She notes that she has become feeling weak, dizzy, unsteady over the past 24 hours, no obvious precipitant, no fever, no focal pain, no focal weakness, no relief with anything including positioning.    Home Medications Prior to Admission medications   Medication Sig Start Date End Date Taking? Authorizing Provider  acetaminophen (TYLENOL) 500 MG tablet Take 1,000 mg by mouth every 6 (six) hours as needed for moderate pain.    [provider]  Acetaminophen 325 MG CAPS Take by mouth.    [provider]  amitriptyline (ELAVIL) 25 MG tablet Take 25 mg by mouth at bedtime.    [provider]  aspirin EC 325 MG tablet Take 325 mg by mouth daily. 06/21/22   [provider]  atorvastatin (LIPITOR) 40 MG tablet Take 40 mg by mouth daily. 08/10/22   [provider]  Cyanocobalamin (VITAMIN B-12 CR) 1000 MCG TBCR Take 1 tablet (1,000 mcg total) by mouth daily. Patient not taking: Reported on 10/18/2022 08/26/22   Harriett Rush, PA-C  escitalopram (LEXAPRO) 10 MG tablet Take 10 mg by mouth daily. 08/10/22   [provider]  ezetimibe (ZETIA) 10 MG tablet Take 10 mg by mouth daily. 08/10/22   [provider]  omega-3 fish oil (MAXEPA) 1000 MG CAPS capsule Take 1 capsule by mouth daily. 07/14/22   [provider]  ondansetron (ZOFRAN-ODT) 4 MG disintegrating tablet Take 1 tablet (4 mg total) by mouth every 8 (eight) hours as needed. 08/20/22   Myna Bright M, PA-C  pantoprazole (PROTONIX) 40 MG tablet Take 40 mg by mouth 2 (two) times daily. 08/10/22   [provider]  potassium chloride SA (KLOR-CON M) 20 MEQ tablet Take 20 mEq by mouth daily. 08/10/22   [provider]      Allergies    Latex, Sulfa antibiotics, Other, and Pravastatin sodium    Review of Systems   Review of Systems  All other systems reviewed and are negative.   Physical Exam Updated Vital Signs BP 114/81 (BP Location: Left Arm)   Pulse 85   Temp 98.6 F (37 C) (Oral)   Resp 20   Ht '5\' 4"'$  (1.626 m)   Wt 63.5 kg   SpO2 100%   BMI 24.03 kg/m  Physical Exam Vitals and nursing note reviewed.  Constitutional:      General: She is not in acute distress.    Appearance: She is well-developed.  HENT:     Head: Normocephalic and atraumatic.  Eyes:     Conjunctiva/sclera: Conjunctivae normal.  Cardiovascular:     Rate and Rhythm: Normal rate and regular rhythm.  Pulmonary:     Effort: Pulmonary effort is normal. No respiratory distress.     Breath sounds: Normal breath sounds. No stridor.  Abdominal:     General: There is no distension.  Skin:    General: Skin is warm and dry.  Neurological:     General: No focal deficit present.     Mental Status: She is alert and oriented to person, place, and time.     Cranial Nerves: No cranial nerve deficit, dysarthria or facial asymmetry.     Motor: Atrophy present. No  tremor.  Psychiatric:        Mood and Affect: Mood normal.     ED Results / Procedures / Treatments   Labs (all labs ordered are listed, but only abnormal results are displayed) Labs Reviewed  CBC WITH DIFFERENTIAL/PLATELET - Abnormal; Notable for the following components:      Result Value   RBC 3.12 (*)    Hemoglobin 9.1 (*)    HCT 29.7 (*)    All other components within normal limits  CBG MONITORING, ED - Abnormal; Notable for the following components:   Glucose-Capillary 123 (*)    All other components within normal limits  RESP PANEL BY RT-PCR (RSV, FLU A&B, COVID)  [id number]  COMPREHENSIVE METABOLIC PANEL  ETHANOL    EKG EKG Interpretation  Date/Time:  Saturday October 29 2022 12:19:33 EST Ventricular Rate:  93 PR  Interval:  148 QRS Duration: 91 QT Interval:  346 QTC Calculation: 431 R Axis:   -24 Text Interpretation: Sinus rhythm Borderline left axis deviation Baseline wander in lead(s) I III aVL V1 Abnormal ECG Confirmed by Carmin Muskrat [phone]) on 10/29/2022 12:32:34 PM  Radiology DG Chest 2 View  Result Date: 10/29/2022 CLINICAL DATA:  Weakness. EXAM: CHEST - 2 VIEW COMPARISON:  May 31, 2022 FINDINGS: There is a tortuous thoracic aorta. The heart, hila, mediastinum are otherwise normal. No pneumothorax. No nodules or masses. No focal infiltrates. IMPRESSION: No active cardiopulmonary disease. Electronically Signed   By: Dorise Bullion III M.D.   On: 10/29/2022 13:31    Procedures Procedures    Medications Ordered in ED Medications - No data to display  ED Course/ Medical Decision Making/ A&P This patient with a Hx of weakness presents to the ED for concern of weakness, dizziness, this involves an extensive number of treatment options, and is a complaint that carries with it a high risk of complications and morbidity.    The differential diagnosis includes TIA, COVID, dehydration, electrolytes, vertigo CVA   Social Determinants of Health:  Advanced age  Additional history obtained:  Additional history and/or information obtained from chart review, notable for history of anemia, vertigo   After the initial evaluation, orders, including: Labs fluids monitoring were initiated.   Patient placed on Cardiac and Pulse-Oximetry Monitors. The patient was maintained on a cardiac monitor.  The cardiac monitored showed an rhythm of 90 sinus normal The patient was also maintained on pulse oximetry. The readings were typically 100% room air normal   On repeat evaluation of the patient improved Patient sitting up in bed, no complaints, smiling. Lab Tests:  I personally interpreted labs.  The pertinent results include: Unremarkable labs for mild anemia  Imaging Studies ordered:  I  independently visualized and interpreted imaging which showed no chest x-ray abnormalities I agree with the radiologist interpretation   Dispostion / Final MDM:  After consideration of the diagnostic results and the patient's response to treatment, this adult female presents with weakness, is awake, alert, hemodynamically unremarkable speaking clearly without focal neurologic deficiencies.  Labs reviewed, x-ray reviewed, no evidence for infection, bacteremia, sepsis that is obvious.  She has no urinary complaints.  COVID test negative, influenza negative.  Patient remained hemodynamically unremarkable, after hours of monitoring in the ED, was discharged to follow-up closely with primary care the hospitalization was a consideration given her age.   Final Clinical Impression(s) / ED Diagnoses Final diagnoses:  Weakness     Carmin Muskrat, MD 10/29/22 1505

## 2022-10-29 NOTE — Discharge Instructions (Signed)
As discussed, your evaluation today has been largely reassuring.  But, it is important that you monitor your condition carefully, and do not hesitate to return to the ED if you develop new, or concerning changes in your condition. ? ?Otherwise, please follow-up with your physician for appropriate ongoing care. ? ?

## 2022-10-29 NOTE — ED Triage Notes (Signed)
"  Patient was seen here on 12/2 and on 12/5 for the same symptoms she is having today and discharged home with no known cause except vertigo. She had friend [facility] complaining of dizziness, weakness and feeling faint" per EMS

## 2022-11-04 ENCOUNTER — Other Ambulatory Visit: Payer: Self-pay

## 2022-11-04 ENCOUNTER — Emergency Department (HOSPITAL_COMMUNITY)
Admission: EM | Admit: 2022-11-04 | Discharge: 2022-11-04 | Disposition: A | Discharge status: Home / Self Care | Payer: Medicare Other | Attending: Emergency Medicine | Admitting: Emergency Medicine

## 2022-11-04 DIAGNOSIS — Z91040 Latex allergy status: Secondary | ICD-10-CM | POA: Diagnosis not present

## 2022-11-04 DIAGNOSIS — R42 Dizziness and giddiness: Secondary | ICD-10-CM | POA: Insufficient documentation

## 2022-11-04 DIAGNOSIS — Z7982 Long term (current) use of aspirin: Secondary | ICD-10-CM | POA: Insufficient documentation

## 2022-11-04 DIAGNOSIS — R531 Weakness: Secondary | ICD-10-CM

## 2022-11-04 DIAGNOSIS — E162 Hypoglycemia, unspecified: Secondary | ICD-10-CM | POA: Insufficient documentation

## 2022-11-04 LAB — URINALYSIS, ROUTINE W REFLEX MICROSCOPIC
Bacteria, UA: NONE SEEN
Bilirubin Urine: NEGATIVE
Glucose, UA: NEGATIVE mg/dL
Hgb urine dipstick: NEGATIVE
Ketones, ur: NEGATIVE mg/dL
Nitrite: NEGATIVE
Protein, ur: NEGATIVE mg/dL
Specific Gravity, Urine: 1.011 (ref 1.005–1.030)
pH: 5 (ref 5.0–8.0)

## 2022-11-04 LAB — BASIC METABOLIC PANEL
Anion gap: 7 (ref 5–15)
BUN: 8 mg/dL (ref 8–23)
CO2: 25 mmol/L (ref 22–32)
Calcium: 9.3 mg/dL (ref 8.9–10.3)
Chloride: 109 mmol/L (ref 98–111)
Creatinine, Ser: 0.92 mg/dL (ref 0.44–1.00)
GFR, Estimated: >60 mL/min (ref >60)
Glucose, Bld: 89 mg/dL (ref 70–99)
Potassium: 3.9 mmol/L (ref 3.5–5.1)
Sodium: 141 mmol/L (ref 135–145)

## 2022-11-04 LAB — CBC
HCT: 28.1 % — ABNORMAL LOW (ref 36.0–46.0)
Hemoglobin: 8.6 g/dL — ABNORMAL LOW (ref 12.0–15.0)
MCH: 28.8 pg (ref 26.0–34.0)
MCHC: 30.6 g/dL (ref 30.0–36.0)
MCV: 94 fL (ref 80.0–100.0)
Platelets: 361 10*3/uL (ref 150–400)
RBC: 2.99 MIL/uL — ABNORMAL LOW (ref 3.87–5.11)
RDW: 15.3 % (ref 11.5–15.5)
WBC: 4.7 10*3/uL (ref 4.0–10.5)
nRBC: 0 % (ref 0.0–0.2)

## 2022-11-04 LAB — CBG MONITORING, ED
Glucose-Capillary: 56 mg/dL — ABNORMAL LOW (ref 70–99)
Glucose-Capillary: 66 mg/dL — ABNORMAL LOW (ref 70–99)
Glucose-Capillary: 130 mg/dL — ABNORMAL HIGH (ref 70–99)

## 2022-11-04 MED ORDER — ACETAMINOPHEN 500 MG PO TABS
1000.0000 mg | ORAL_TABLET | Freq: Once | ORAL | Status: AC
  Administered 2022-11-04: 1000 mg via ORAL
  Filled 2022-11-04: qty 2

## 2022-11-04 NOTE — ED Triage Notes (Signed)
Pt arrived from [facility] for dizziness x 2 days.

## 2022-11-04 NOTE — ED Provider Notes (Signed)
[facility] Provider Note   CSN: [id number] Arrival date & time: 11/04/22  1141     History {Add pertinent medical, surgical, social history, OB history to HPI:1} No chief complaint on file.   Melody Mitchell is a 81 y.o. female.  HPI This patient is an 81 year old female, treated for cholesterol with atorvastatin, she is known to have dementia according to the paramedics, she lives by herself, she has a history of multiple visits to the emergency department for weakness dizziness and near syncope in fact review of the medical record shows that she has been here 6 days ago, 2 weeks ago, 3 weeks ago, and multiple other visits going back over the months.  She did have an admission for GI bleeding and symptomatic anemia from July 2023 but denies any recent bleeding.  Presents today with generalized weakness and a feeling of dizziness which she describes as lightheadedness with standing.  She does not feel bad when she is lying down but does have a lack of energy.  There is no nausea vomiting fevers chills coughing or shortness of breath, denies any headache or any pain at all.    Home Medications Prior to Admission medications   Medication Sig Start Date End Date Taking? Authorizing Provider  acetaminophen (TYLENOL) 500 MG tablet Take 1,000 mg by mouth every 6 (six) hours as needed for moderate pain.    [provider]  Acetaminophen 325 MG CAPS Take by mouth.    [provider]  amitriptyline (ELAVIL) 25 MG tablet Take 25 mg by mouth at bedtime.    [provider]  aspirin EC 325 MG tablet Take 325 mg by mouth daily. 06/21/22   [provider]  atorvastatin (LIPITOR) 40 MG tablet Take 40 mg by mouth daily. 08/10/22   [provider]  Cyanocobalamin (VITAMIN B-12 CR) 1000 MCG TBCR Take 1 tablet (1,000 mcg total) by mouth daily. Patient not taking: Reported on 10/18/2022 08/26/22   Harriett Rush, PA-C  escitalopram  (LEXAPRO) 10 MG tablet Take 10 mg by mouth daily. 08/10/22   [provider]  ezetimibe (ZETIA) 10 MG tablet Take 10 mg by mouth daily. 08/10/22   [provider]  omega-3 fish oil (MAXEPA) 1000 MG CAPS capsule Take 1 capsule by mouth daily. 07/14/22   [provider]  ondansetron (ZOFRAN-ODT) 4 MG disintegrating tablet Take 1 tablet (4 mg total) by mouth every 8 (eight) hours as needed. 08/20/22   Myna Bright M, PA-C  pantoprazole (PROTONIX) 40 MG tablet Take 40 mg by mouth 2 (two) times daily. 08/10/22   [provider]  potassium chloride SA (KLOR-CON M) 20 MEQ tablet Take 20 mEq by mouth daily. 08/10/22   [provider]      Allergies    Latex, Sulfa antibiotics, Other, and Pravastatin sodium    Review of Systems   Review of Systems  All other systems reviewed and are negative.   Physical Exam Updated Vital Signs There were no vitals taken for this visit. Physical Exam Vitals and nursing note reviewed.  Constitutional:      General: She is not in acute distress.    Appearance: She is well-developed.  HENT:     Head: Normocephalic and atraumatic.     Mouth/Throat:     Pharynx: No oropharyngeal exudate.  Eyes:     General: No scleral icterus.       Right eye: No discharge.  Left eye: No discharge.     Conjunctiva/sclera: Conjunctivae normal.     Pupils: Pupils are equal, round, and reactive to light.  Neck:     Thyroid: No thyromegaly.     Vascular: No JVD.  Cardiovascular:     Rate and Rhythm: Normal rate and regular rhythm.     Heart sounds: Normal heart sounds. No murmur heard.    No friction rub. No gallop.  Pulmonary:     Effort: Pulmonary effort is normal. No respiratory distress.     Breath sounds: Normal breath sounds. No wheezing or rales.  Abdominal:     General: Bowel sounds are normal. There is no distension.     Palpations: Abdomen is soft. There is no mass.     Tenderness: There is no abdominal  tenderness.  Musculoskeletal:        General: No tenderness. Normal range of motion.     Cervical back: Normal range of motion and neck supple.     Right lower leg: No edema.     Left lower leg: No edema.  Lymphadenopathy:     Cervical: No cervical adenopathy.  Skin:    General: Skin is warm and dry.     Findings: No erythema or rash.  Neurological:     Mental Status: She is alert.     Coordination: Coordination normal.     Comments: Cranial nerves III through XII are normal, she has normal speech, normal coordination by finger-nose-finger, she has no pronator drift and is able to hold both hands for 10 seconds in the ER, normal grips, very strong, can lift both legs with normal strength.  Normal level of alertness, she knows where she is and what her name is  Psychiatric:        Behavior: Behavior normal.     ED Results / Procedures / Treatments   Labs (all labs ordered are listed, but only abnormal results are displayed) Labs Reviewed - No data to display  EKG None  Radiology No results found.  Procedures Procedures  {Document cardiac monitor, telemetry assessment procedure when appropriate:1}  Medications Ordered in ED Medications - No data to display  ED Course/ Medical Decision Making/ A&P                           Medical Decision Making  This patient presents to the ED for concern of neurolysed weakness, this involves an extensive number of treatment options, and is a complaint that carries with it a high risk of complications and morbidity.  The differential diagnosis includes arrhythmia, infection, dehydration, hyperkalemia, hypokalemia, anemia, stroke   Co morbidities that complicate the patient evaluation  Chronic weakness, history of GI bleed   Additional history obtained:  Additional history obtained from electronic medical record External records from outside source obtained and reviewed including multiple ER visits and admission to the hospital in  earlier 2023 for GI bleeding   Lab Tests:  I Ordered, and personally interpreted labs.  The pertinent results include:  ***   Imaging Studies ordered:  I ordered imaging studies including ***  I independently visualized and interpreted imaging which showed *** I agree with the radiologist interpretation   Cardiac Monitoring: / EKG:  The patient was maintained on a cardiac monitor.  I personally viewed and interpreted the cardiac monitored which showed an underlying rhythm of: ***   Consultations Obtained:  I requested consultation with the ***,  and discussed lab  and imaging findings as well as pertinent plan - they recommend: ***   Problem List / ED Course / Critical interventions / Medication management  *** I ordered medication including ***  for ***  Reevaluation of the patient after these medicines showed that the patient {resolved/improved/worsened:23923::"improved"} I have reviewed the patients home medicines and have made adjustments as needed   Social Determinants of Health:  ***   Test / Admission - Considered:  ***   {Document critical care time when appropriate:1} {Document review of labs and clinical decision tools ie heart score, Chads2Vasc2 etc:1}  {Document your independent review of radiology images, and any outside records:1} {Document your discussion with family members, caretakers, and with consultants:1} {Document social determinants of health affecting pt's care:1} {Document your decision making why or why not admission, treatments were needed:1} Final Clinical Impression(s) / ED Diagnoses Final diagnoses:  None    Rx / DC Orders ED Discharge Orders     None

## 2022-11-04 NOTE — ED Notes (Signed)
Pt received OJ, milk, and graham crackers and sprite for bs 56.

## 2022-11-04 NOTE — ED Notes (Signed)
Pt reported she has not eaten today. EDP aware of bs 66 and requested more po foods. A sandwhich, a package of peanut butter nabs, chips, and lemon lime drink.

## 2022-11-04 NOTE — Discharge Instructions (Signed)
I suspect  that you have been weak because of your blood sugar.  If you do not eat regularly your blood sugar will drop and make you feel weak.  All of your other tests are very close to baseline.  I want you to make sure that you are eating at least 3 times a day.  Please see your doctor within 3 days for recheck, ER for worsening symptoms

## 2022-11-18 ENCOUNTER — Inpatient Hospital Stay: Payer: 59 | Attending: Hematology

## 2022-11-18 DIAGNOSIS — D508 Other iron deficiency anemias: Secondary | ICD-10-CM | POA: Diagnosis present

## 2022-11-18 DIAGNOSIS — T452X1A Poisoning by vitamins, accidental (unintentional), initial encounter: Secondary | ICD-10-CM

## 2022-11-18 DIAGNOSIS — E559 Vitamin D deficiency, unspecified: Secondary | ICD-10-CM | POA: Diagnosis not present

## 2022-11-18 DIAGNOSIS — R5383 Other fatigue: Secondary | ICD-10-CM | POA: Diagnosis not present

## 2022-11-18 DIAGNOSIS — E538 Deficiency of other specified B group vitamins: Secondary | ICD-10-CM | POA: Insufficient documentation

## 2022-11-18 DIAGNOSIS — Z87891 Personal history of nicotine dependence: Secondary | ICD-10-CM | POA: Diagnosis not present

## 2022-11-18 DIAGNOSIS — D500 Iron deficiency anemia secondary to blood loss (chronic): Secondary | ICD-10-CM

## 2022-11-18 LAB — CBC WITH DIFFERENTIAL/PLATELET
Abs Immature Granulocytes: 0.02 10*3/uL (ref 0.00–0.07)
Basophils Absolute: 0 10*3/uL (ref 0.0–0.1)
Basophils Relative: 0 %
Eosinophils Absolute: 0 10*3/uL (ref 0.0–0.5)
Eosinophils Relative: 1 %
HCT: 28.5 % — ABNORMAL LOW (ref 36.0–46.0)
Hemoglobin: 8.4 g/dL — ABNORMAL LOW (ref 12.0–15.0)
Immature Granulocytes: 0 %
Lymphocytes Relative: 12 %
Lymphs Abs: 0.6 10*3/uL — ABNORMAL LOW (ref 0.7–4.0)
MCH: 27 pg (ref 26.0–34.0)
MCHC: 29.5 g/dL — ABNORMAL LOW (ref 30.0–36.0)
MCV: 91.6 fL (ref 80.0–100.0)
Monocytes Absolute: 0.5 10*3/uL (ref 0.1–1.0)
Monocytes Relative: 9 %
Neutro Abs: 3.8 10*3/uL (ref 1.7–7.7)
Neutrophils Relative %: 78 %
Platelets: 424 10*3/uL — ABNORMAL HIGH (ref 150–400)
RBC: 3.11 MIL/uL — ABNORMAL LOW (ref 3.87–5.11)
RDW: 16.1 % — ABNORMAL HIGH (ref 11.5–15.5)
WBC: 4.9 10*3/uL (ref 4.0–10.5)
nRBC: 0 % (ref 0.0–0.2)

## 2022-11-18 LAB — IRON AND TIBC
Iron: 13 ug/dL — ABNORMAL LOW (ref 28–170)
Saturation Ratios: 4 % — ABNORMAL LOW (ref 10.4–31.8)
TIBC: 353 ug/dL (ref 250–450)
UIBC: 340 ug/dL

## 2022-11-18 LAB — FERRITIN: Ferritin: 7 ng/mL — ABNORMAL LOW (ref 11–307)

## 2022-11-18 LAB — VITAMIN D 25 HYDROXY (VIT D DEFICIENCY, FRACTURES): Vit D, 25-Hydroxy: 58.15 ng/mL (ref 30–100)

## 2022-11-19 ENCOUNTER — Encounter: Payer: Self-pay | Admitting: Hematology

## 2022-11-24 NOTE — Progress Notes (Signed)
[place], [place] [address]   CLINIC:  Medical Oncology/Hematology  PCP:  [name], MD [address] [place] [address] [phone]   REASON FOR VISIT:  Follow-up for iron deficiency anemia   CURRENT THERAPY: Intermittent IV iron infusions   INTERVAL HISTORY:   Melody Mitchell 82 y.o. female returns for routine follow-up of iron deficiency anemia.  She was last seen by Tarri Abernethy PA-C on 08/26/2022.  She was scheduled for IV [name] x 2, but only showed up for her [name] infusion on 09/02/2022.  At today's visit, she reports feeling "so so."  No recent hospitalizations, surgeries, or changes in baseline health status.  She reports that she is still having intermittent black bowel movements, but is unsure of when her last episode of melena was.  She denies any other bleeding such as epistaxis, hematemesis, hematuria, or hematochezia.  She reports that her energy is fair. She has intermittent headaches.  She denies any pica, chest pain, dyspnea on exertion, lightheadedness, or syncope.   She has 70% energy and 70% appetite.  She had previously lost about 15 to 20 pounds in the course of 6 months, but her weight has been relatively stable since February 2023.  She is drinking Ensure once daily.  She does still note some decreased appetite.  ASSESSMENT & PLAN:  1.  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  She presented to ED on with acute on chronic weakness and fatigue, found to be hemoccult positive with Hgb 8.3 (dropped from 10.0 two weeks prior).  She received 1 unit PRBC.   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  Renal function normal for age. - Last [name] infusion on 09/02/2022 - Most recent labs (11/18/2022): Hgb 8.4/MCV 91.6, platelets 424, lymphocyte 0.6.  Ferritin 7, iron saturation 4%. - She continues to report dark stools, about once every 1-2 weeks    - She is symptomatic with fatigue      - She is followed by the GI providers at [facility] - PLAN: Recommend IV [name] x3 - Repeat labs and RTC in 2 months   - Follow-up with GI as scheduled   2.  Vitamin D deficiency, with toxicity: - She was instructed at prior appointments to stop taking vitamin D due to elevated levels, but she does not think that she did. - Vitamin D (12/20/2021) remained elevated at 142.55 - she was once again instructed to stop taking her vitamin D, but she has continued to take it.   -Prior labs (05/03/2022) showed significantly elevated vitamin D at 193.00, but labs today (06/02/2022) show vitamin D trending down to 143.62 - Vitamin D normalized as of 08/18/2022 with levels at 84.04 - Most recent labs (11/18/2022) with normal vitamin D 58.15 - PLAN: No vitamin D supplement at this time. - We will restart her on the lower dose if appropriate in the future.  Recheck vitamin D level in 3-6 months    3.  Borderline vitamin B12 deficiency: - Labs on 6/20/202 08/18/2022 3 show vitamin B12 at 691 with normal methylmalonic acid -  She is not sure if she is taking her B12 supplement or not, but it is listed on her medication list from her pharmacy - PLAN: Encouraged her to continue vitamin B12 supplement.  We will check B12 and methylmalonic acid in April/May 2024   4.  Poor appetite and weight loss     - Reports decreased appetite for the past several months - She has noted to have increasing memory deficits, and there is some concern that she may be forgetting to eat - PLAN: Patient instructed to continue drinking 1-2 Ensure/Boost beverages daily. - We  will check weight at follow-up appointment in 2 months. - If further weight loss, will consider appetite stimulant, dietitian referral, and/or additional work-up.  PLAN SUMMARY: >> IV [name] x 3 >> Labs in 2 months (CBC/D, CMP, ferritin, iron/TIBC) >> OFFICE visit 1 week after labs    REVIEW OF SYSTEMS:   Review of Systems  Constitutional:  Positive for fatigue. Negative for chills, diaphoresis, fever and unexpected weight change.  HENT:   Negative for lump/mass and nosebleeds.   Eyes:  Negative for eye problems.  Respiratory:  Negative for cough, hemoptysis and shortness of breath.   Cardiovascular:  Negative for chest pain, leg swelling and palpitations.  Gastrointestinal:  Negative for abdominal pain, blood in stool, constipation, diarrhea, nausea and vomiting.  Genitourinary:  Negative for hematuria.   Musculoskeletal:  Positive for arthralgias (knees).  Skin: Negative.   Neurological:  Positive for headaches. Negative for dizziness and light-headedness.  Hematological:  Does not bruise/bleed easily.  Psychiatric/Behavioral:  Positive for confusion and sleep disturbance. Negative for depression. The patient is not nervous/anxious.      PHYSICAL EXAM:  ECOG PERFORMANCE STATUS: 1 - Symptomatic but completely ambulatory  There were no vitals filed for this visit. There were no vitals filed for this visit. Physical Exam Constitutional:      Appearance: Normal appearance. She is obese.  HENT:     Head: Normocephalic and atraumatic.     Mouth/Throat:     Mouth: Mucous membranes are moist.  Eyes:     Extraocular Movements: Extraocular movements intact.     Pupils: Pupils are equal, round, and reactive to light.  Cardiovascular:     Rate and Rhythm: Normal rate and regular rhythm.     Pulses: Normal pulses.     Heart sounds: Normal heart sounds.  Pulmonary:     Effort: Pulmonary effort is normal.     Breath sounds: Normal breath sounds.  Abdominal:     General: Bowel  sounds are normal.     Palpations: Abdomen is soft.     Tenderness: There is no abdominal tenderness.  Musculoskeletal:        General: No swelling.     Right lower leg: No edema.     Left lower leg: No edema.  Lymphadenopathy:     Cervical: No cervical adenopathy.  Skin:    General: Skin is warm and dry.  Neurological:     General: No focal deficit present.     Mental Status: She is alert. She is disoriented.     Comments: Brief cognitive assessment - oriented to person and place, unable to recall the current year, suggested that it was possibly 2029.  Incorrectly named current president as "Chief Financial Officer."  Was able to immediately repeat the words "cat," "candle," and "box," but was only able to recall 1 out of the 3 words 60 seconds later.  Psychiatric:  Mood and Affect: Mood normal.        Behavior: Behavior normal.        Cognition and Memory: Cognition is impaired. Memory is impaired.     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 four colon polyps removed. No source  for change in bowel habits identified 3. Rectal bleeding due to large internal hemorrhoids    COLONOSCOPY W/ BIOPSIES  6 2010   Dr. Oneida Alar: Polypoid appearing lesion of the ascending colon, 3 mm sessile rectal polyp, small internal hemorrhoids. Pathology revealed polypoid mucosa, no  adenomatous changes   Double-balloon enteroscopy, antegrade  April 2011   Dr. 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   ENTEROSCOPY N/A 06/02/2022   Procedure: ENTEROSCOPY;  Surgeon: Daneil Dolin, MD;  Location: [facility];  Service: Endoscopy;  Laterality: N/A;  with pediatric colonoscope   ESOPHAGOGASTRODUODENOSCOPY  04/2009   Dr. Oneida Alar: Patent Schatzki ring dilated with advancing the scope, patchy erythema in the antrum, 2 small AVMs in the duodenal bulb, 2 additional AVMs noted in the second portion the duodenum, mild gastritis on pathology   ESOPHAGOGASTRODUODENOSCOPY N/A 06/29/2015   SLF: 1. 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 small hiatal hernia 3. Mild non-erosive gastririts- NO obvious source for dyspepsia identified.    ESOPHAGOGASTRODUODENOSCOPY N/A 03/13/2020   Fields: Low-grade narrowing Schatzki ring, small hiatal hernia, multiple nonbleeding angiectasia's in the jejunum treated with APC.  Spot tattoo in jejunum at 90 cm from the incisors.   ESOPHAGOGASTRODUODENOSCOPY N/A 06/02/2022   Procedure: ESOPHAGOGASTRODUODENOSCOPY (EGD);  Surgeon: Daneil Dolin, MD;  Location: [facility];  Service: Endoscopy;  Laterality: N/A;   EYE SURGERY     HOT HEMOSTASIS  06/02/2022   Procedure: HOT HEMOSTASIS (ARGON PLASMA COAGULATION/BICAP);  Surgeon: Daneil Dolin, MD;  Location: [facility];  Service: Endoscopy;;    SOCIAL HISTORY:  Social History   Socioeconomic History   Marital status: Divorced    Spouse name: Not on file   Number of children: 1   Years of education: Not on file   Highest education level: Not on file  Occupational History   Not on file  Tobacco Use   Smoking status: Former    Packs/day: 1.00    Years: 25.00    Total pack years: 25.00    Types: Cigarettes    Quit date: 09/14/2007    Years since quitting: 15.2   Smokeless tobacco: Never  Vaping Use   Vaping Use: Never used  Substance and Sexual Activity    Alcohol use: Not Currently    Comment: occasional/rare (Brandy every now and then)   Drug use: No   Sexual activity: Not Currently    Birth  control/protection: Post-menopausal, Surgical  Other Topics Concern   Not on file  Social History Narrative   Not on file   Social Determinants of Health   Financial Resource Strain: Not on file  Food Insecurity: Not on file  Transportation Needs: Not on file  Physical Activity: Not on file  Stress: Not on file  Social Connections: Not on file  Intimate Partner Violence: Not on file    FAMILY HISTORY:  Family History  Problem Relation Age of Onset   Heart failure Mother    Diabetes Sister    Heart attack Father    Colon cancer Neg Hx    Gastric cancer Neg Hx    Esophageal cancer Neg Hx     CURRENT MEDICATIONS:  Outpatient Encounter Medications as of 11/25/2022  Medication Sig   acetaminophen (TYLENOL) 500 MG tablet Take 1,000 mg by mouth every 6 (six) hours as needed for moderate pain.   Acetaminophen 325 MG CAPS Take by mouth.   amitriptyline (ELAVIL) 25 MG tablet Take 25 mg by mouth at bedtime.   aspirin EC 325 MG tablet Take 325 mg by mouth daily.   atorvastatin (LIPITOR) 40 MG tablet Take 40 mg by mouth daily.   Cyanocobalamin (VITAMIN B-12 CR) 1000 MCG TBCR Take 1 tablet (1,000 mcg total) by mouth daily. (Patient not taking: Reported on 10/18/2022)   escitalopram (LEXAPRO) 10 MG tablet Take 10 mg by mouth daily.   ezetimibe (ZETIA) 10 MG tablet Take 10 mg by mouth daily.   omega-3 fish oil (MAXEPA) 1000 MG CAPS capsule Take 1 capsule by mouth daily.   ondansetron (ZOFRAN-ODT) 4 MG disintegrating tablet Take 1 tablet (4 mg total) by mouth every 8 (eight) hours as needed.   pantoprazole (PROTONIX) 40 MG tablet Take 40 mg by mouth 2 (two) times daily.   potassium chloride SA (KLOR-CON M) 20 MEQ tablet Take 20 mEq by mouth daily.   No facility-administered encounter medications on file as of 11/25/2022.    ALLERGIES:  Allergies   Allergen Reactions   Latex Hives, Itching and Rash   Sulfa Antibiotics Hives and Itching    Other reaction(s): hives   Other Hives   Pravastatin Sodium Other (See Comments)    LABORATORY DATA:  I have reviewed the labs as listed.  CBC    Component Value Date/Time   WBC 4.9 11/18/2022 1025   RBC 3.11 (L) 11/18/2022 1025   HGB 8.4 (L) 11/18/2022 1025   HCT 28.5 (L) 11/18/2022 1025   PLT 424 (H) 11/18/2022 1025   MCV 91.6 11/18/2022 1025   MCH 27.0 11/18/2022 1025   MCHC 29.5 (L) 11/18/2022 1025   RDW 16.1 (H) 11/18/2022 1025   LYMPHSABS 0.6 (L) 11/18/2022 1025   MONOABS 0.5 11/18/2022 1025   EOSABS 0.0 11/18/2022 1025   BASOSABS 0.0 11/18/2022 1025      Latest Ref Rng & Units 11/04/2022   12:07 PM 10/29/2022    1:26 PM 10/18/2022    1:17 PM  CMP  Glucose 70 - 99 mg/dL 89  79  72   BUN 8 - 23 mg/dL '8  12  8   '$ Creatinine 0.44 - 1.00 mg/dL 0.92  0.84  1.02   Sodium 135 - 145 mmol/L 141  138  140   Potassium 3.5 - 5.1 mmol/L 3.9  3.6  4.0   Chloride 98 - 111 mmol/L 109  105  110   CO2 22 - 32 mmol/L 25  27  22  Calcium 8.9 - 10.3 mg/dL 9.3  9.4  9.0   Total Protein 6.5 - 8.1 g/dL  7.0    Total Bilirubin 0.3 - 1.2 mg/dL  0.3    Alkaline Phos 38 - 126 U/L  57    AST 15 - 41 U/L  19    ALT 0 - 44 U/L  10      DIAGNOSTIC IMAGING:  I have independently reviewed the relevant imaging and discussed with the patient.   WRAP UP:  All questions were answered. The patient knows to call the clinic with any problems, questions or concerns.  Medical decision making: Moderate  Time spent on visit: I spent 20 minutes counseling the patient face to face. The total time spent in the appointment was 30 minutes and more than 50% was on counseling.  Harriett Rush, PA-C  11/25/2022 11:15 AM

## 2022-11-25 ENCOUNTER — Inpatient Hospital Stay (HOSPITAL_BASED_OUTPATIENT_CLINIC_OR_DEPARTMENT_OTHER): Payer: 59 | Admitting: Physician Assistant

## 2022-11-25 VITALS — BP 113/72 | HR 74 | Temp 99.6°F | Resp 17 | Ht 64.0 in | Wt 131.1 lb

## 2022-11-25 DIAGNOSIS — D500 Iron deficiency anemia secondary to blood loss (chronic): Secondary | ICD-10-CM | POA: Diagnosis not present

## 2022-11-25 DIAGNOSIS — D508 Other iron deficiency anemias: Secondary | ICD-10-CM | POA: Diagnosis not present

## 2022-11-25 DIAGNOSIS — E538 Deficiency of other specified B group vitamins: Secondary | ICD-10-CM

## 2022-11-25 MED ORDER — VITAMIN B-12 ER 1000 MCG PO TBCR: 1.0000 | EXTENDED_RELEASE_TABLET | Freq: Every day | ORAL | 3 refills | Status: DC

## 2022-11-25 NOTE — Patient Instructions (Addendum)
[place] at [facility] Discharge Instructions  You were seen today by Tarri Abernethy PA-C for your iron deficiency anemia.  Your blood and iron levels are low, so we will schedule you for IV IRON x3 doses.  You should also continue to follow-up with your gastroenterologist [facility] doctor) for ongoing monitoring and management of your intestinal blood loss.     You should CONTINUE taking over-the-counter vitamin B12 supplement 1000 mcg daily  You should CONTINUE drinking Ensure (or Boost) 1-2 drinks daily due to your decreased appetite and weight loss.       IV IRON SCHEDULE Wednesday, January 17 at 2:30 PM Wednesday, January 24 at 2:00 PM Wednesday, January 31 2:00 PM      LABS: Return in 2 months for repeat labs on Monday, March 11 at 12:30 PM      FOLLOW-UP APPOINTMENT: Office visit on Monday, March 18 at 2:00 PM    ** Thank you for trusting me with your healthcare!  I strive to provide all of my patients with quality care at each visit.  If you receive a survey for this visit, I would be so grateful to you for taking the time to provide feedback.  Thank you in advance!  ~ Creig Landin                   Dr. Derek Jack   &   Tarri Abernethy, PA-C   - - - - - - - - - - - - - - - - - -     Thank you for choosing [place] at [facility] to provide your oncology and hematology care.  To afford each patient quality time with our provider, please arrive at least 15 minutes before your scheduled appointment time.   If you have a lab appointment with the [facility] please come in thru the Main Entrance and check in at the main information desk.  You need to re-schedule your appointment should you arrive 10 or more minutes late.  We strive to give you quality time with our providers, and arriving late affects you and other patients whose appointments are after yours.  Also, if you no show three or more times for appointments  you may be dismissed from the clinic at the providers discretion.     Again, thank you for choosing [facility]  Our hope is that these requests will decrease the amount of time that you wait before being seen by our physicians.       _____________________________________________________________  Should you have questions after your visit to [facility], please contact our office at [phone] and follow the prompts.  Our office hours are 8:00 a.m. and 4:30 p.m. Monday - Friday.  Please note that voicemails left after 4:00 p.m. may not be returned until the following business day.  We are closed weekends and major holidays.  You do have access to a nurse 24-7, just call the main number to the clinic [phone] and do not press any options, hold on the line and a nurse will answer the phone.    For prescription refill requests, have your pharmacy contact our office and allow 72 hours.    Due to Covid, you will need to wear a mask upon entering the hospital. If you do not have a mask, a mask will be given to you at the Main Entrance upon arrival. For doctor visits, patients may  have 1 support person age 63 or older with them. For treatment visits, patients can not have anyone with them due to social distancing guidelines and our immunocompromised population.

## 2022-11-27 IMAGING — DX DG CHEST 2V
2 series · 2 of 2 positions shown · non-contrast
Comparison: March 09, 2020.

CLINICAL DATA: Near syncope.  Dizziness.

EXAM:
CHEST - 2 VIEW

[chest lat]
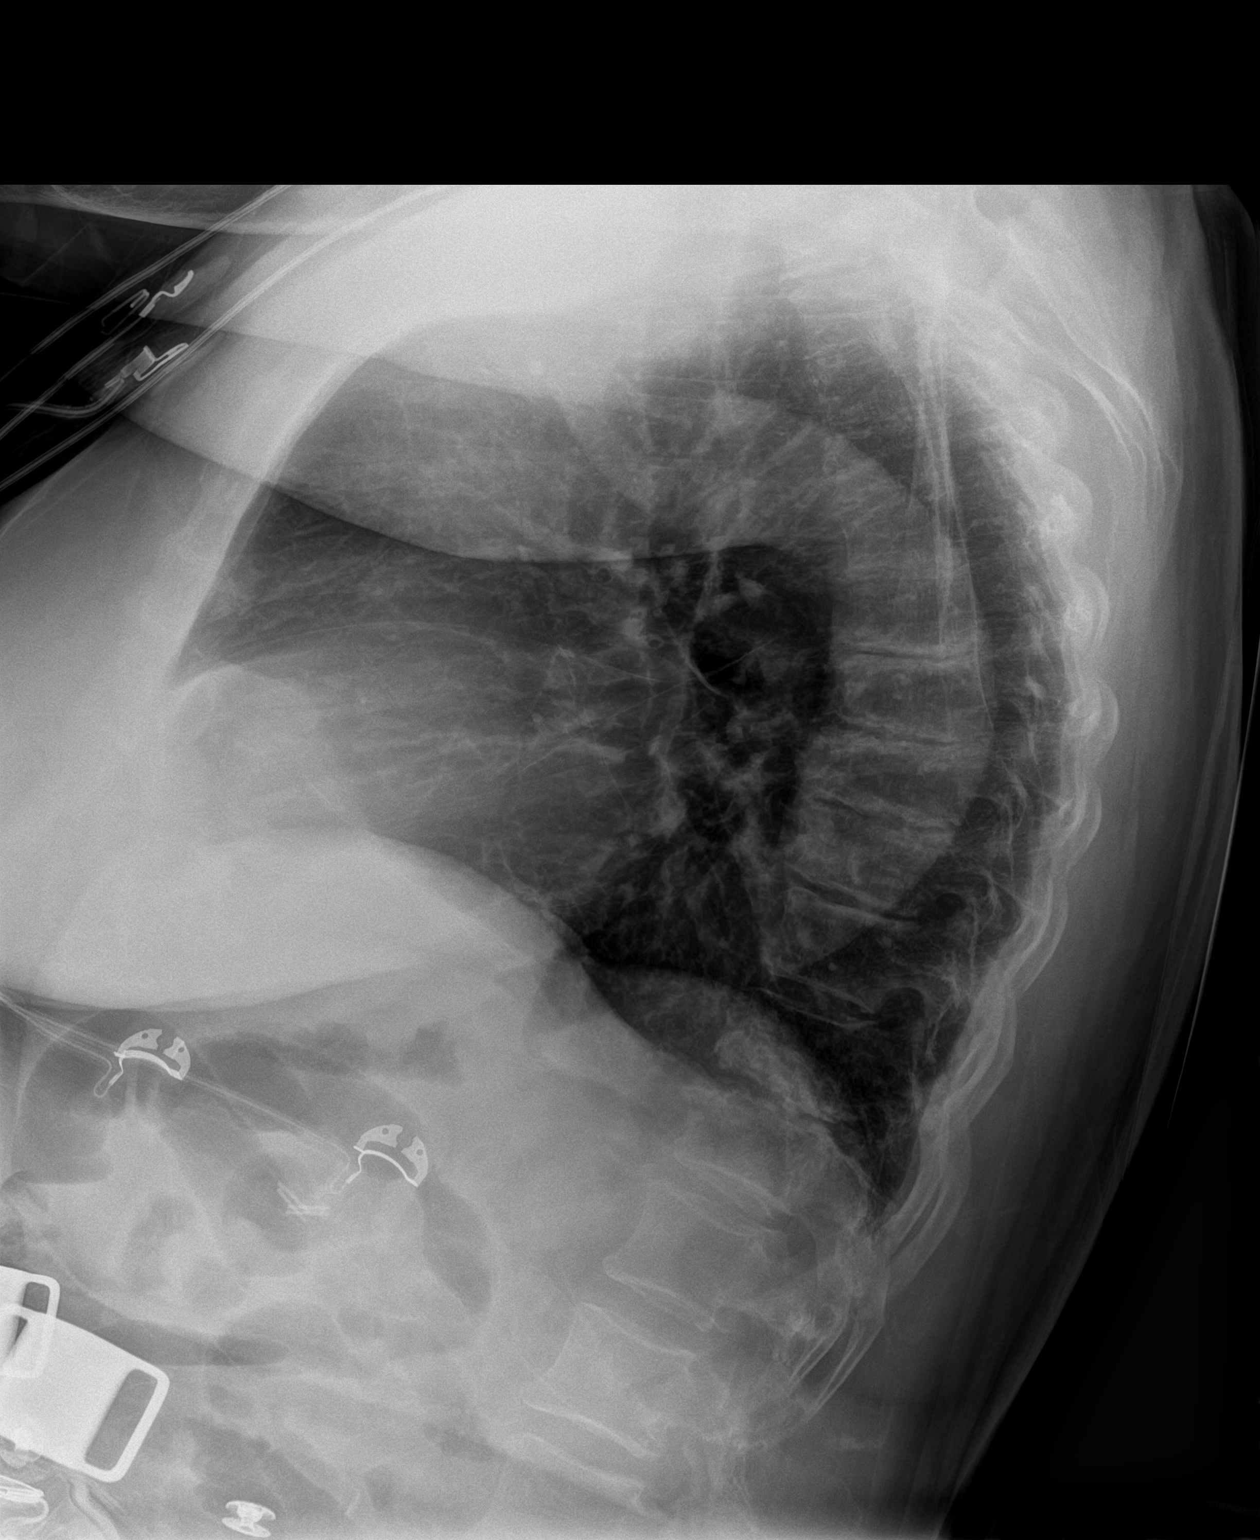

[chest ap]
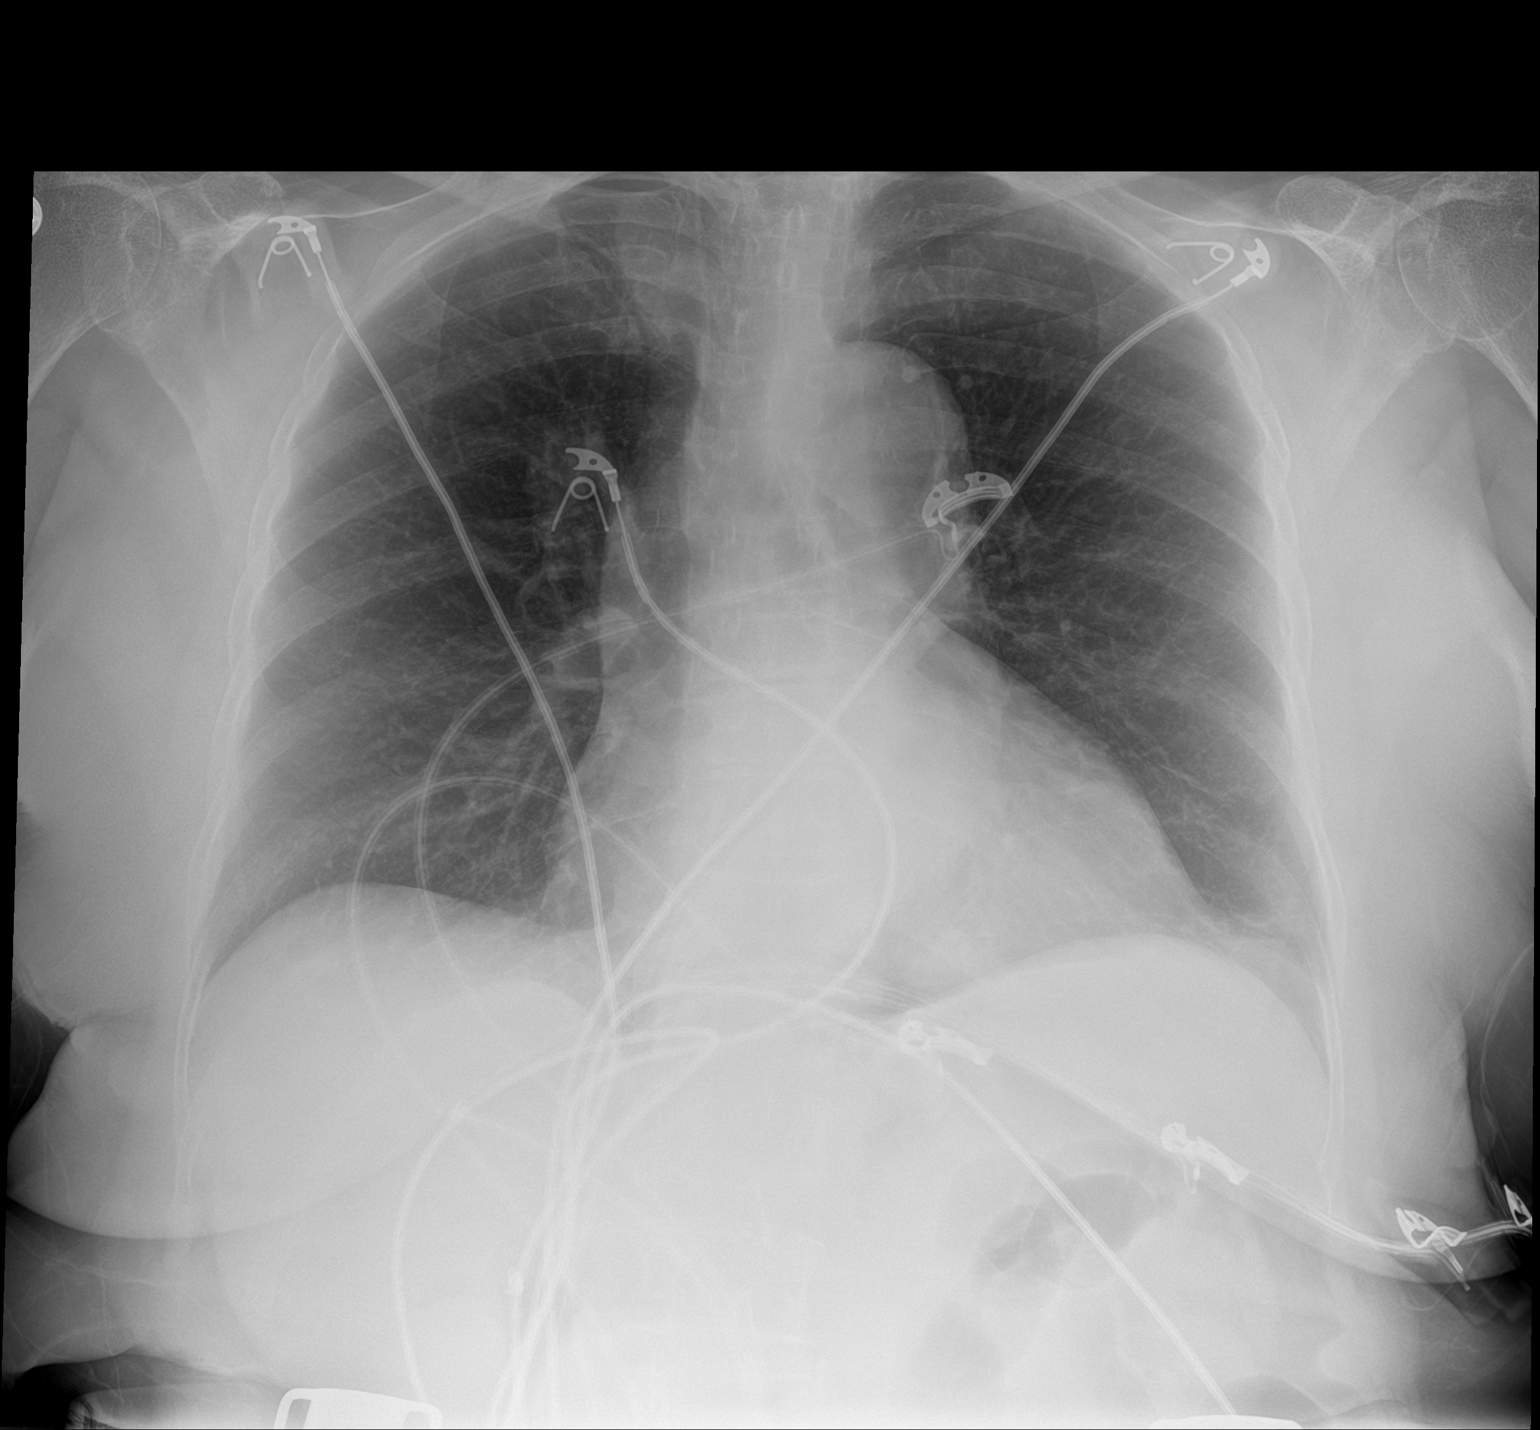

[2 of 2 positions shown; findings below may reference images not displayed]

FINDINGS: Similar cardiomediastinal silhouette with tortuous aorta.
Streaky/linear left basilar opacities. No visible pleural effusions
or pneumothorax. No acute osseous abnormality.
IMPRESSION: Streaky/linear left basilar opacities, favor atelectasis.

## 2022-11-30 ENCOUNTER — Inpatient Hospital Stay: Payer: 59

## 2022-11-30 VITALS — BP 96/64 | HR 71 | Temp 97.2°F | Resp 18

## 2022-11-30 DIAGNOSIS — D508 Other iron deficiency anemias: Secondary | ICD-10-CM | POA: Diagnosis not present

## 2022-11-30 DIAGNOSIS — D500 Iron deficiency anemia secondary to blood loss (chronic): Secondary | ICD-10-CM

## 2022-11-30 MED ORDER — ACETAMINOPHEN 325 MG PO TABS
650.0000 mg | ORAL_TABLET | Freq: Once | ORAL | Status: AC
  Administered 2022-11-30: 650 mg via ORAL
  Filled 2022-11-30: qty 2

## 2022-11-30 MED ORDER — SODIUM CHLORIDE 0.9 % IV SOLN
510.0000 mg | Freq: Once | INTRAVENOUS | Status: AC
  Administered 2022-11-30: 510 mg via INTRAVENOUS
  Filled 2022-11-30: qty 510

## 2022-11-30 MED ORDER — CETIRIZINE HCL 10 MG PO TABS
10.0000 mg | ORAL_TABLET | Freq: Once | ORAL | Status: AC
  Administered 2022-11-30: 10 mg via ORAL
  Filled 2022-11-30: qty 1

## 2022-11-30 MED ORDER — LORATADINE 10 MG PO TABS: 10.0000 mg | ORAL_TABLET | Freq: Once | ORAL | Status: DC

## 2022-11-30 MED ORDER — SODIUM CHLORIDE 0.9 % IV SOLN: Freq: Once | INTRAVENOUS | Status: AC

## 2022-11-30 NOTE — Progress Notes (Signed)
Pt presents today for [name] IV iron infusion per provider's order. Vital signs stable and pt voiced no new complaints at this time. ? ?Peripheral IV started with good blood return pre and post infusion. ? ?[name] given today per MD orders. Tolerated infusion without adverse affects. Vital signs stable. No complaints at this time. Discharged from clinic ambulatory with cane in stable condition. Alert and oriented x 3. F/U with [facility] as scheduled.   ?

## 2022-11-30 NOTE — Patient Instructions (Signed)
[facility] [place]  Discharge Instructions: Thank you for choosing [facility] to provide your oncology and hematology care.  If you have a lab appointment with the Cancer Center, please come in thru the Main Entrance and check in at the main information desk.  Wear comfortable clothing and clothing appropriate for easy access to any Portacath or PICC line.   We strive to give you quality time with your provider. You may need to reschedule your appointment if you arrive late (15 or more minutes).  Arriving late affects you and other patients whose appointments are after yours.  Also, if you miss three or more appointments without notifying the office, you may be dismissed from the clinic at the provider's discretion.      For prescription refill requests, have your pharmacy contact our office and allow 72 hours for refills to be completed.    Today you received [name] IV iron infusion.   BELOW ARE SYMPTOMS THAT SHOULD BE REPORTED IMMEDIATELY: *FEVER GREATER THAN 100.4 F (38 C) OR HIGHER *CHILLS OR SWEATING *NAUSEA AND VOMITING THAT IS NOT CONTROLLED WITH YOUR NAUSEA MEDICATION *UNUSUAL SHORTNESS OF BREATH *UNUSUAL BRUISING OR BLEEDING *URINARY PROBLEMS (pain or burning when urinating, or frequent urination) *BOWEL PROBLEMS (unusual diarrhea, constipation, pain near the anus) TENDERNESS IN MOUTH AND THROAT WITH OR WITHOUT PRESENCE OF ULCERS (sore throat, sores in mouth, or a toothache) UNUSUAL RASH, SWELLING OR PAIN  UNUSUAL VAGINAL DISCHARGE OR ITCHING   Items with * indicate a potential emergency and should be followed up as soon as possible or go to the Emergency Department if any problems should occur.  Please show the CHEMOTHERAPY ALERT CARD or IMMUNOTHERAPY ALERT CARD at check-in to the Emergency Department and triage nurse.  Should you have questions after your visit or need to cancel or reschedule your appointment, please contact [facility] [place] [phone]  and follow the prompts.  Office hours are 8:00 a.m. to 4:30 p.m. Monday - Friday. Please note that voicemails left after 4:00 p.m. may not be returned until the following business day.  We are closed weekends and major holidays. You have access to a nurse at all times for urgent questions. Please call the main number to the clinic [phone] and follow the prompts.  For any non-urgent questions, you may also contact your provider using MyChart. We now offer e-Visits for anyone 18 and older to request care online for non-urgent symptoms. For details visit [email or web address].   Also download the MyChart app! Go to the app store, search "MyChart", open the app, select [place], and log in with your MyChart username and password.   

## 2022-12-07 ENCOUNTER — Inpatient Hospital Stay: Payer: 59

## 2022-12-07 VITALS — BP 96/57 | HR 61 | Temp 96.9°F | Resp 18

## 2022-12-07 DIAGNOSIS — D508 Other iron deficiency anemias: Secondary | ICD-10-CM | POA: Diagnosis not present

## 2022-12-07 DIAGNOSIS — D500 Iron deficiency anemia secondary to blood loss (chronic): Secondary | ICD-10-CM

## 2022-12-07 MED ORDER — SODIUM CHLORIDE 0.9 % IV SOLN
510.0000 mg | Freq: Once | INTRAVENOUS | Status: AC
  Administered 2022-12-07: 510 mg via INTRAVENOUS
  Filled 2022-12-07: qty 17

## 2022-12-07 MED ORDER — CETIRIZINE HCL 10 MG PO TABS
10.0000 mg | ORAL_TABLET | Freq: Once | ORAL | Status: AC
  Administered 2022-12-07: 10 mg via ORAL
  Filled 2022-12-07: qty 1

## 2022-12-07 MED ORDER — SODIUM CHLORIDE 0.9 % IV SOLN: Freq: Once | INTRAVENOUS | Status: AC

## 2022-12-07 MED ORDER — ACETAMINOPHEN 325 MG PO TABS
650.0000 mg | ORAL_TABLET | Freq: Once | ORAL | Status: AC
  Administered 2022-12-07: 650 mg via ORAL
  Filled 2022-12-07: qty 2

## 2022-12-07 NOTE — Progress Notes (Signed)
Patient presents today for feraheme infusion per providers order.  Vital signs WNL.  Patient has no new complaints at this time.  Peripheral IV started and blood return noted pre and post infusion.  Stable during infusion without adverse affects.  Vital signs stable.  No complaints at this time.  Discharge from clinic ambulatory in stable condition.  Alert and oriented X 3.  Follow up with [facility] as scheduled.

## 2022-12-07 NOTE — Patient Instructions (Signed)
[facility] [place]  Discharge Instructions: Thank you for choosing [facility] to provide your oncology and hematology care.  If you have a lab appointment with the Cancer Center, please come in thru the Main Entrance and check in at the main information desk.  Wear comfortable clothing and clothing appropriate for easy access to any Portacath or PICC line.   We strive to give you quality time with your provider. You may need to reschedule your appointment if you arrive late (15 or more minutes).  Arriving late affects you and other patients whose appointments are after yours.  Also, if you miss three or more appointments without notifying the office, you may be dismissed from the clinic at the provider's discretion.      For prescription refill requests, have your pharmacy contact our office and allow 72 hours for refills to be completed.    Today you received the following chemotherapy and/or immunotherapy agents feraheme      To help prevent nausea and vomiting after your treatment, we encourage you to take your nausea medication as directed.  BELOW ARE SYMPTOMS THAT SHOULD BE REPORTED IMMEDIATELY: *FEVER GREATER THAN 100.4 F (38 C) OR HIGHER *CHILLS OR SWEATING *NAUSEA AND VOMITING THAT IS NOT CONTROLLED WITH YOUR NAUSEA MEDICATION *UNUSUAL SHORTNESS OF BREATH *UNUSUAL BRUISING OR BLEEDING *URINARY PROBLEMS (pain or burning when urinating, or frequent urination) *BOWEL PROBLEMS (unusual diarrhea, constipation, pain near the anus) TENDERNESS IN MOUTH AND THROAT WITH OR WITHOUT PRESENCE OF ULCERS (sore throat, sores in mouth, or a toothache) UNUSUAL RASH, SWELLING OR PAIN  UNUSUAL VAGINAL DISCHARGE OR ITCHING   Items with * indicate a potential emergency and should be followed up as soon as possible or go to the Emergency Department if any problems should occur.  Please show the CHEMOTHERAPY ALERT CARD or IMMUNOTHERAPY ALERT CARD at check-in to the  Emergency Department and triage nurse.  Should you have questions after your visit or need to cancel or reschedule your appointment, please contact [facility] [place] [phone]  and follow the prompts.  Office hours are 8:00 a.m. to 4:30 p.m. Monday - Friday. Please note that voicemails left after 4:00 p.m. may not be returned until the following business day.  We are closed weekends and major holidays. You have access to a nurse at all times for urgent questions. Please call the main number to the clinic [phone] and follow the prompts.  For any non-urgent questions, you may also contact your provider using MyChart. We now offer e-Visits for anyone 18 and older to request care online for non-urgent symptoms. For details visit [email or web address].   Also download the MyChart app! Go to the app store, search "MyChart", open the app, select [place], and log in with your MyChart username and password.   

## 2022-12-14 ENCOUNTER — Inpatient Hospital Stay: Payer: 59

## 2022-12-14 VITALS — BP 107/62 | HR 62 | Temp 96.7°F | Resp 18

## 2022-12-14 DIAGNOSIS — D508 Other iron deficiency anemias: Secondary | ICD-10-CM | POA: Diagnosis not present

## 2022-12-14 DIAGNOSIS — D500 Iron deficiency anemia secondary to blood loss (chronic): Secondary | ICD-10-CM

## 2022-12-14 MED ORDER — CETIRIZINE HCL 10 MG PO TABS
10.0000 mg | ORAL_TABLET | Freq: Once | ORAL | Status: AC
  Administered 2022-12-14: 10 mg via ORAL
  Filled 2022-12-14: qty 1

## 2022-12-14 MED ORDER — SODIUM CHLORIDE 0.9 % IV SOLN
510.0000 mg | Freq: Once | INTRAVENOUS | Status: AC
  Administered 2022-12-14: 510 mg via INTRAVENOUS
  Filled 2022-12-14: qty 510

## 2022-12-14 MED ORDER — SODIUM CHLORIDE 0.9 % IV SOLN: Freq: Once | INTRAVENOUS | Status: AC

## 2022-12-14 MED ORDER — ACETAMINOPHEN 325 MG PO TABS
650.0000 mg | ORAL_TABLET | Freq: Once | ORAL | Status: AC
  Administered 2022-12-14: 650 mg via ORAL
  Filled 2022-12-14: qty 2

## 2022-12-14 NOTE — Patient Instructions (Signed)
[place]  Discharge Instructions: Thank you for choosing [place] [facility] to provide your oncology and hematology care.  If you have a lab appointment with the [facility], please come in thru the Main Entrance and check in at the main information desk.  Wear comfortable clothing and clothing appropriate for easy access to any Portacath or PICC line.   We strive to give you quality time with your provider. You may need to reschedule your appointment if you arrive late (15 or more minutes).  Arriving late affects you and other patients whose appointments are after yours.  Also, if you miss three or more appointments without notifying the office, you may be dismissed from the clinic at the provider's discretion.      For prescription refill requests, have your pharmacy contact our office and allow 72 hours for refills to be completed.    Today you received the following venofer, return as scheduled.    To help prevent nausea and vomiting after your treatment, we encourage you to take your nausea medication as directed.  BELOW ARE SYMPTOMS THAT SHOULD BE REPORTED IMMEDIATELY: *FEVER GREATER THAN 100.4 F (38 C) OR HIGHER *CHILLS OR SWEATING *NAUSEA AND VOMITING THAT IS NOT CONTROLLED WITH YOUR NAUSEA MEDICATION *UNUSUAL SHORTNESS OF BREATH *UNUSUAL BRUISING OR BLEEDING *URINARY PROBLEMS (pain or burning when urinating, or frequent urination) *BOWEL PROBLEMS (unusual diarrhea, constipation, pain near the anus) TENDERNESS IN MOUTH AND THROAT WITH OR WITHOUT PRESENCE OF ULCERS (sore throat, sores in mouth, or a toothache) UNUSUAL RASH, SWELLING OR PAIN  UNUSUAL VAGINAL DISCHARGE OR ITCHING   Items with * indicate a potential emergency and should be followed up as soon as possible or go to the Emergency Department if any problems should occur.  Please show the CHEMOTHERAPY ALERT CARD or IMMUNOTHERAPY ALERT CARD at check-in to the Emergency Department and  triage nurse.  Should you have questions after your visit or need to cancel or reschedule your appointment, please contact [facility] [phone]  and follow the prompts.  Office hours are 8:00 a.m. to 4:30 p.m. Monday - Friday. Please note that voicemails left after 4:00 p.m. may not be returned until the following business day.  We are closed weekends and major holidays. You have access to a nurse at all times for urgent questions. Please call the main number to the clinic [phone] and follow the prompts.  For any non-urgent questions, you may also contact your provider using MyChart. We now offer e-Visits for anyone 30 and older to request care online for non-urgent symptoms. For details visit [email or web address].   Also download the MyChart app! Go to the app store, search "MyChart", open the app, select [place], and log in with your MyChart username and password.

## 2022-12-14 NOTE — Progress Notes (Signed)
Patient tolerated iron infusion with no complaints voiced.  Peripheral IV site clean and dry with good blood return noted before and after infusion.  Band aid applied.  VSS with discharge and left in satisfactory condition with no s/s of distress noted.   

## 2022-12-16 IMAGING — CT CT CTA ABD/PEL W/CM AND/OR W/O CM
3 of 12 series · 11 of 46 positions shown, 17 images · IV contrast (omnipaque)
Comparison: CT abdomen and pelvis 08/04/2019 and 01/02/2017

CLINICAL DATA: 80-year-old with weakness and dark stools. Evaluate
for GI bleed.

EXAM:
CTA ABDOMEN AND PELVIS WITHOUT AND WITH CONTRAST
TECHNIQUE: Multidetector CT imaging of the abdomen and pelvis was performed
using the standard protocol during bolus administration of
intravenous contrast. Multiplanar reconstructed images and MIPs were
obtained and reviewed to evaluate the vascular anatomy.
CONTRAST:  100mL OMNIPAQUE IOHEXOL 350 MG/ML SOLN

[Series 11: mesenteric axial arterial · axial · arterial · 0.65mm/px · z∈[+808,+1060]mm · 6 of 212 slices shown]
[im 15/212  soft-tissue]
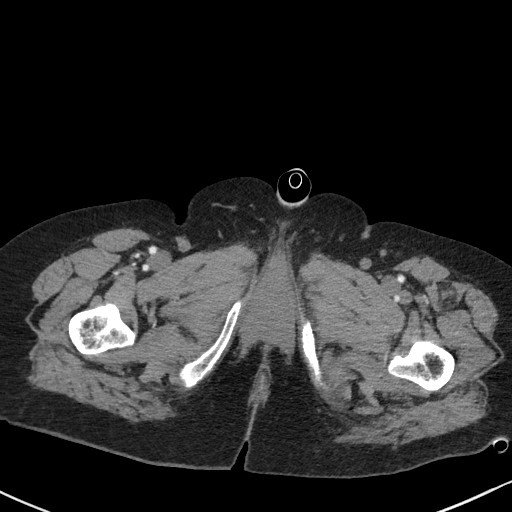
[im 43/212  soft-tissue]
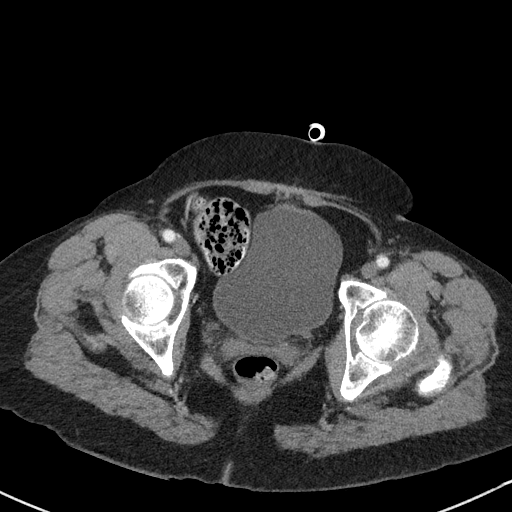
[im 71/212  soft-tissue]
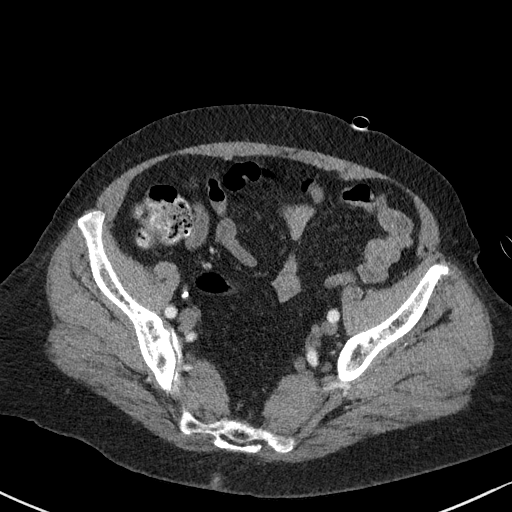
[im 99/212  soft-tissue]
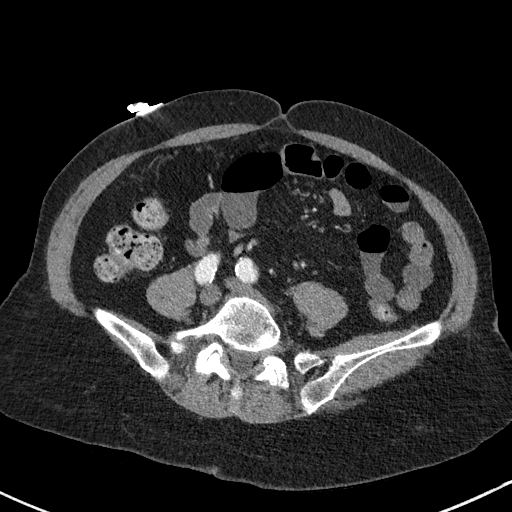
[im 113/212  soft-tissue]
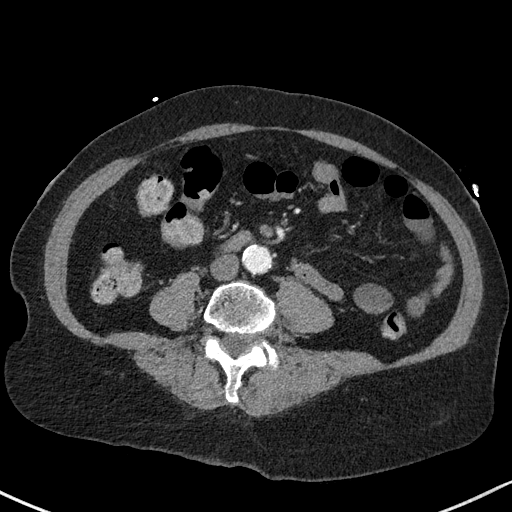
[im 141/212  soft-tissue]
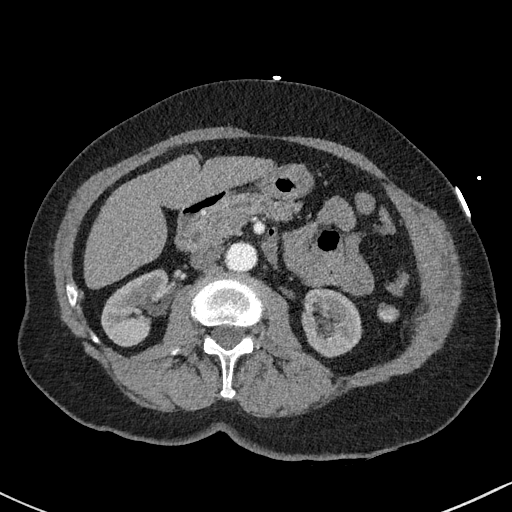

[Series 13: coronals · coronal · 0.65mm/px · 1 of 134 slices shown, 2 images]
[im 67/134  soft-tissue]
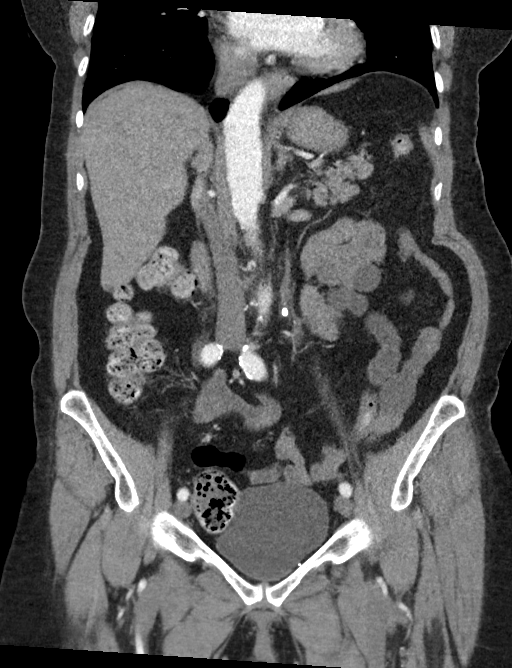
[im 67/134  bone]
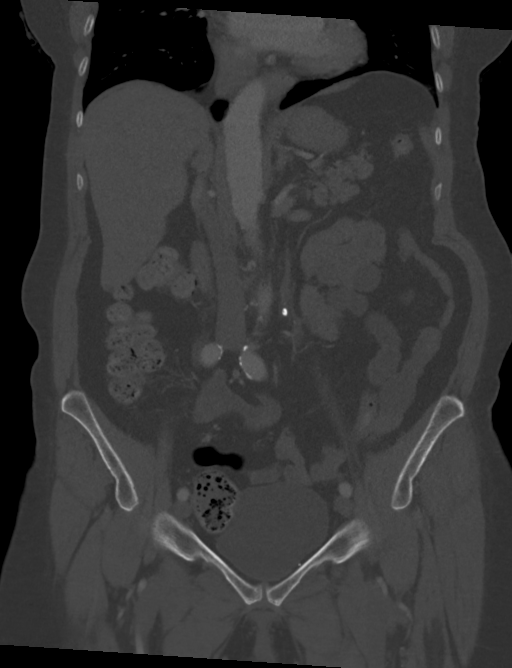

[Series 17: axial venous · axial · portal-venous · 0.65mm/px · z∈[+865,+1115]mm · 4 of 84 slices shown, 9 images]
[im 17/84  soft-tissue]
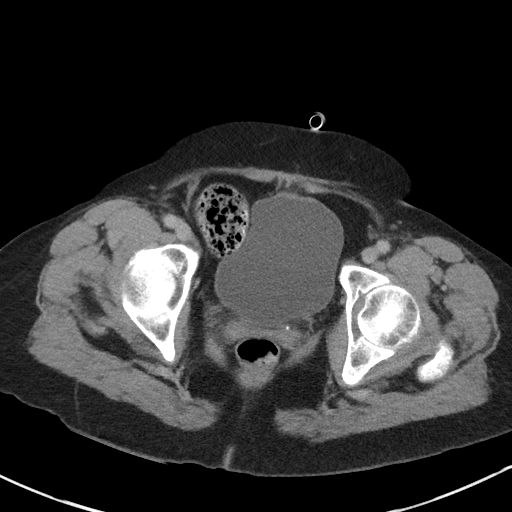
[im 17/84  lung]
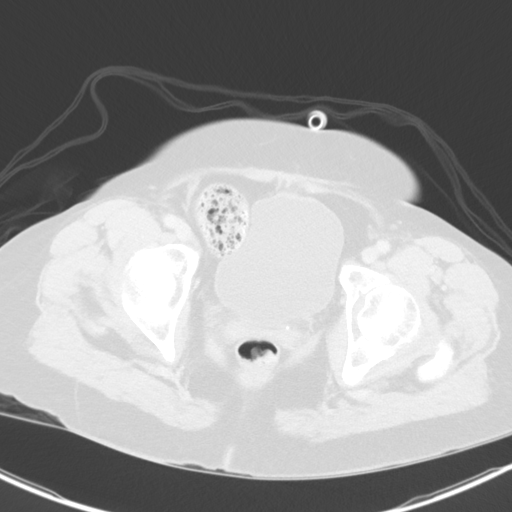
[im 17/84  bone]
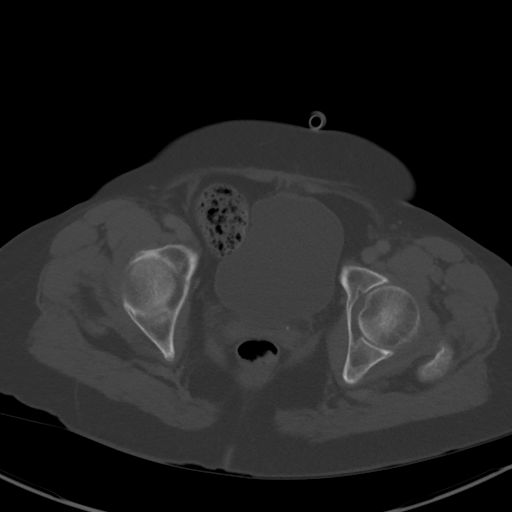
[im 34/84  soft-tissue]
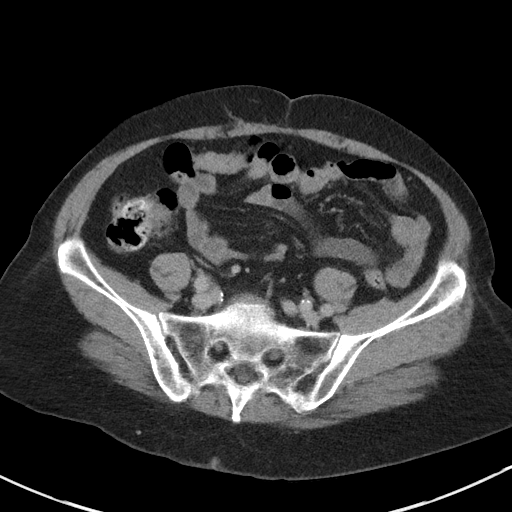
[im 34/84  lung]
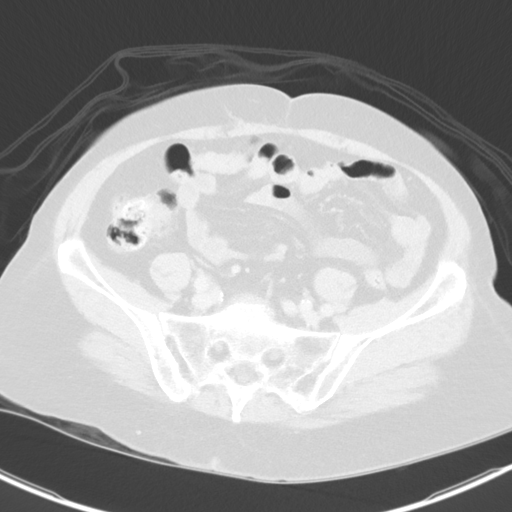
[im 50/84  soft-tissue]
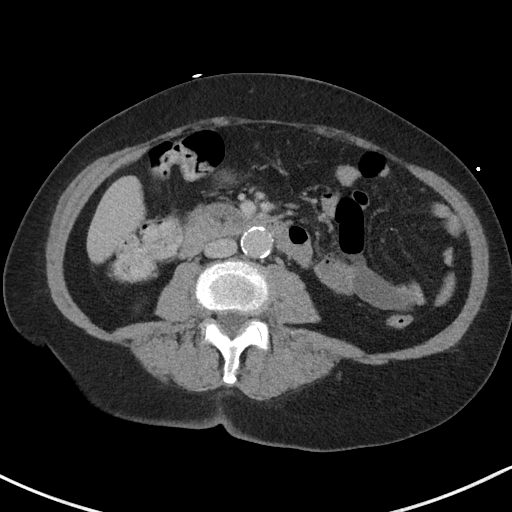
[im 50/84  lung]
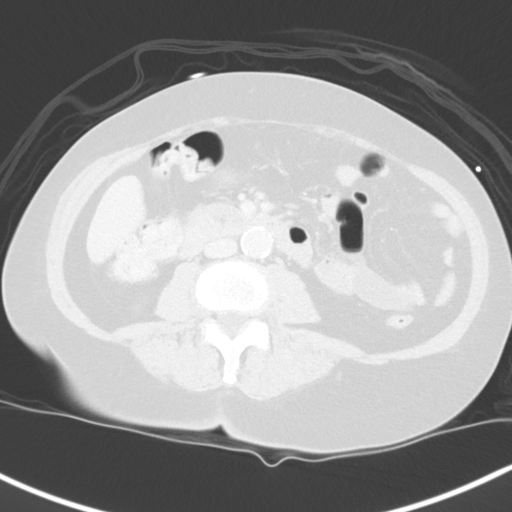
[im 67/84  soft-tissue]
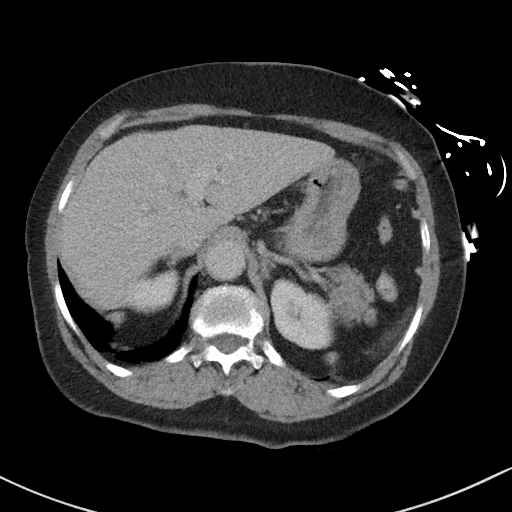
[im 67/84  lung]
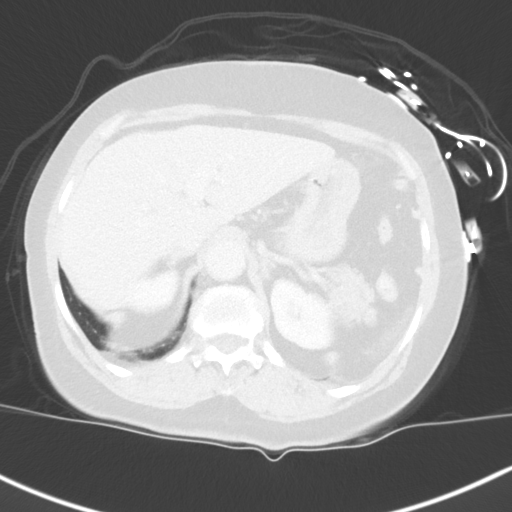

[11 of 46 positions shown; findings below may reference images not displayed]

FINDINGS: VASCULAR

Aorta: Atherosclerotic calcifications involving the abdominal aorta
without aneurysm, stenosis or dissection.

Celiac: Patent without evidence of aneurysm, dissection, vasculitis
or significant stenosis.

SMA: Patent without evidence of aneurysm, dissection, vasculitis or
significant stenosis.

Renals: Both renal arteries are patent without evidence of aneurysm,
dissection, vasculitis, fibromuscular dysplasia or significant
stenosis.

IMA: Patent

Inflow: Patent without evidence of aneurysm, dissection, vasculitis
or significant stenosis. Tortuosity of the iliac arteries.

Proximal Outflow: Proximal femoral arteries are patent bilaterally.

Veins: Portal venous system is patent. SMV is patent. Bilateral
renal veins are patent. There may be some narrowing near the
proximal aspect of the left renal vein. Common iliac veins are
patent.

Review of the MIP images confirms the above findings.

NON-VASCULAR

Lower chest: Few densities at the lung bases are suggestive for mild
atelectasis or scarring. No significant pleural fluid.

Hepatobiliary: Extrahepatic bile duct is prominent measuring up to 8
mm and similar to the prior examination. Gallbladder has been
removed. No significant intrahepatic biliary dilatation. No
suspicious liver lesion.

Pancreas: Unremarkable. No pancreatic ductal dilatation or
surrounding inflammatory changes.

Spleen: Small calcifications.  No acute abnormality.

Adrenals/Urinary Tract: Normal adrenal glands. Negative for kidney
stones. No hydronephrosis. Small hypodensity in the right kidney
lower pole likely represents a small cyst. No suspicious renal
lesions. Fluid in the urinary bladder.

Stomach/Bowel: High-density material in the colon on the pre
contrast images. Scattered colonic diverticula. Appendix is not
confidently identified. No evidence for active GI bleeding or acute
bowel inflammation. No evidence for bowel obstruction.

Lymphatic: No abdominal or pelvic lymph node enlargement.

Reproductive: Uterus has been removed. There is a prominent left
ovarian or adnexal structure along the left pelvic sidewall on
sequence 17, image 61. This area measures 2.8 x 2.4 cm and minimally
changed since 2386.

Other: Negative for ascites. No acute inflammatory changes within
the abdomen or pelvis. Tiny periumbilical hernia containing fat.

Musculoskeletal: Disc space narrowing and endplate changes at
T11-T12 and similar findings in 4949.
IMPRESSION: VASCULAR

1. No evidence for active GI bleeding.
2. Atherosclerotic disease in the abdominal aorta without aneurysm
or dissection. Main visceral arteries are patent without significant
stenosis. Aortic Atherosclerosis ([id number]).

NON-VASCULAR

1. No acute abnormality in the abdomen or pelvis.
2. Cholecystectomy.
3. Left ovarian/adnexal tissue is prominent but unchanged since
2386. This is compatible with a benign finding.

## 2022-12-29 ENCOUNTER — Emergency Department (HOSPITAL_COMMUNITY)
Admission: EM | Admit: 2022-12-29 | Discharge: 2022-12-29 | Disposition: A | Discharge status: Home / Self Care | Payer: 59 | Attending: Student | Admitting: Student

## 2022-12-29 ENCOUNTER — Encounter (HOSPITAL_COMMUNITY): Payer: Self-pay

## 2022-12-29 ENCOUNTER — Emergency Department (HOSPITAL_COMMUNITY): Payer: 59

## 2022-12-29 ENCOUNTER — Other Ambulatory Visit: Payer: Self-pay

## 2022-12-29 DIAGNOSIS — R748 Abnormal levels of other serum enzymes: Secondary | ICD-10-CM | POA: Diagnosis not present

## 2022-12-29 DIAGNOSIS — Z7982 Long term (current) use of aspirin: Secondary | ICD-10-CM | POA: Insufficient documentation

## 2022-12-29 DIAGNOSIS — R1013 Epigastric pain: Secondary | ICD-10-CM | POA: Diagnosis present

## 2022-12-29 DIAGNOSIS — Z1152 Encounter for screening for COVID-19: Secondary | ICD-10-CM | POA: Diagnosis not present

## 2022-12-29 DIAGNOSIS — Z79899 Other long term (current) drug therapy: Secondary | ICD-10-CM | POA: Diagnosis not present

## 2022-12-29 DIAGNOSIS — Z91040 Latex allergy status: Secondary | ICD-10-CM | POA: Diagnosis not present

## 2022-12-29 DIAGNOSIS — R1012 Left upper quadrant pain: Secondary | ICD-10-CM | POA: Diagnosis not present

## 2022-12-29 DIAGNOSIS — Z8673 Personal history of transient ischemic attack (TIA), and cerebral infarction without residual deficits: Secondary | ICD-10-CM | POA: Insufficient documentation

## 2022-12-29 DIAGNOSIS — I10 Essential (primary) hypertension: Secondary | ICD-10-CM | POA: Insufficient documentation

## 2022-12-29 DIAGNOSIS — Z87891 Personal history of nicotine dependence: Secondary | ICD-10-CM | POA: Diagnosis not present

## 2022-12-29 LAB — CBC WITH DIFFERENTIAL/PLATELET
Abs Immature Granulocytes: 0.01 10*3/uL (ref 0.00–0.07)
Basophils Absolute: 0 10*3/uL (ref 0.0–0.1)
Basophils Relative: 1 %
Eosinophils Absolute: 0.1 10*3/uL (ref 0.0–0.5)
Eosinophils Relative: 2 %
HCT: 34.8 % — ABNORMAL LOW (ref 36.0–46.0)
Hemoglobin: 10.5 g/dL — ABNORMAL LOW (ref 12.0–15.0)
Immature Granulocytes: 0 %
Lymphocytes Relative: 16 %
Lymphs Abs: 0.7 10*3/uL (ref 0.7–4.0)
MCH: 29.4 pg (ref 26.0–34.0)
MCHC: 30.2 g/dL (ref 30.0–36.0)
MCV: 97.5 fL (ref 80.0–100.0)
Monocytes Absolute: 0.4 10*3/uL (ref 0.1–1.0)
Monocytes Relative: 10 %
Neutro Abs: 2.9 10*3/uL (ref 1.7–7.7)
Neutrophils Relative %: 71 %
Platelets: 249 10*3/uL (ref 150–400)
RBC: 3.57 MIL/uL — ABNORMAL LOW (ref 3.87–5.11)
RDW: 22.3 % — ABNORMAL HIGH (ref 11.5–15.5)
WBC: 4 10*3/uL (ref 4.0–10.5)
nRBC: 0 % (ref 0.0–0.2)

## 2022-12-29 LAB — URINALYSIS, ROUTINE W REFLEX MICROSCOPIC
Bacteria, UA: NONE SEEN
Bilirubin Urine: NEGATIVE
Glucose, UA: NEGATIVE mg/dL
Hgb urine dipstick: NEGATIVE
Ketones, ur: NEGATIVE mg/dL
Nitrite: NEGATIVE
Protein, ur: NEGATIVE mg/dL
Specific Gravity, Urine: 1.011 (ref 1.005–1.030)
pH: 7 (ref 5.0–8.0)

## 2022-12-29 LAB — COMPREHENSIVE METABOLIC PANEL
ALT: 13 U/L (ref 0–44)
AST: 19 U/L (ref 15–41)
Albumin: 3.8 g/dL (ref 3.5–5.0)
Alkaline Phosphatase: 65 U/L (ref 38–126)
Anion gap: 8 (ref 5–15)
BUN: 10 mg/dL (ref 8–23)
CO2: 27 mmol/L (ref 22–32)
Calcium: 9.4 mg/dL (ref 8.9–10.3)
Chloride: 105 mmol/L (ref 98–111)
Creatinine, Ser: 0.94 mg/dL (ref 0.44–1.00)
GFR, Estimated: 60 mL/min — ABNORMAL LOW (ref >60)
Glucose, Bld: 83 mg/dL (ref 70–99)
Potassium: 4.1 mmol/L (ref 3.5–5.1)
Sodium: 140 mmol/L (ref 135–145)
Total Bilirubin: 0.4 mg/dL (ref 0.3–1.2)
Total Protein: 7.2 g/dL (ref 6.5–8.1)

## 2022-12-29 LAB — RESP PANEL BY RT-PCR (RSV, FLU A&B, COVID)  RVPGX2
Influenza A by PCR: NEGATIVE
Influenza B by PCR: NEGATIVE
Resp Syncytial Virus by PCR: NEGATIVE
SARS Coronavirus 2 by RT PCR: NEGATIVE

## 2022-12-29 LAB — LIPASE, BLOOD: Lipase: 74 U/L — ABNORMAL HIGH (ref 11–51)

## 2022-12-29 MED ORDER — IOHEXOL 300 MG/ML  SOLN
100.0000 mL | Freq: Once | INTRAMUSCULAR | Status: AC | PRN
  Administered 2022-12-29: 100 mL via INTRAVENOUS

## 2022-12-29 MED ORDER — LACTATED RINGERS IV BOLUS
1000.0000 mL | Freq: Once | INTRAVENOUS | Status: AC
  Administered 2022-12-29: 1000 mL via INTRAVENOUS

## 2022-12-29 NOTE — ED Provider Notes (Signed)
[facility] Provider Note  CSN: [id number] Arrival date & time: 12/29/22 1146  Chief Complaint(s) Abdominal Pain  HPI Melody Mitchell is a 82 y.o. female with PMH small bowel AVM, HTN, iron deficiency anemia on iron infusions, previous CVA who presents emergency department for evaluation of generalized fatigue and abdominal pain.  She states that she has had intermittent epigastric pain for the last few weeks and reportedly went to an urgent care today who transferred the patient to the emergency department for further evaluation.  This note is indicating the patient had dizziness and stomach discomfort but does not indicate why they sent the patient to the emergency department.  Here in the emergency department, patient is very well-appearing stating that she just feels a little fatigued with some intermittent crampy epigastric abdominal pain.  She is hemodynamically stable and currently denies chest pain, shortness of breath, diarrhea, nausea, vomiting or other systemic symptoms.   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   ANTEGRADE BALLOON ENTEROSCOPY W/ APC IN 2011 [facility]   Bronchitis january 2014   Hiatal hernia    High cholesterol    History of contact dermatitis    Hx: recurrent pneumonia    Hypertension    IDA (iron deficiency anemia)    chronic iron infusions   Reflux    Vertigo    Patient Active Problem List   Diagnosis Date Noted   GIB (gastrointestinal bleeding) 06/01/2022   Weakness 06/01/2022    Class: Acute   Depression 06/01/2022    Class: Chronic   Dark stools 12/18/2019   Stroke (cerebrum) (HCC) 09/05/2019   Rectal bleeding 02/02/2017   Aortic atherosclerosis ([place]) 01/03/2017   Heme positive stool 06/10/2015   Abdominal pain, epigastric 06/10/2015   Hyperlipidemia    GERD 01/03/2011   HEMORRHOIDS  04/15/2010   Iron deficiency anemia due to chronic blood loss 01/12/2010   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 Authorizing Provider  acetaminophen (TYLENOL) 500 MG tablet Take 1,000 mg by mouth every 6 (six) hours as needed for moderate pain.    [provider]  Acetaminophen 325 MG CAPS Take by mouth.    [provider]  amitriptyline (ELAVIL) 25 MG tablet Take 25 mg by mouth at bedtime.    [provider]  aspirin EC 325 MG tablet Take 325 mg by mouth daily. 06/21/22   [provider]  atorvastatin (LIPITOR) 40 MG tablet Take 40 mg by mouth daily. 08/10/22   [provider]  Cyanocobalamin (VITAMIN B-12 CR) 1000 MCG TBCR Take 1 tablet (1,000 mcg total) by mouth daily. 11/25/22   Harriett Rush, PA-C  escitalopram (LEXAPRO) 10 MG tablet Take 10 mg by mouth daily. 08/10/22   [provider]  ezetimibe (ZETIA) 10 MG tablet Take 10 mg by mouth daily. 08/10/22   [provider]  mirtazapine (REMERON) 15 MG tablet Take 15 mg by mouth at bedtime. 10/24/22   [provider]  omega-3 fish oil (MAXEPA) 1000 MG CAPS capsule Take 1 capsule by mouth daily. 07/14/22   [provider]  ondansetron (ZOFRAN-ODT) 4 MG disintegrating tablet Take 1 tablet (4 mg total) by mouth every 8 (eight) hours as needed. 08/20/22   Myna Bright M, PA-C  pantoprazole (  PROTONIX) 40 MG tablet Take 40 mg by mouth 2 (two) times daily. 08/10/22   [provider]  potassium chloride SA (KLOR-CON M) 20 MEQ tablet Take 20 mEq by mouth daily. 08/10/22   [provider]                                                                                                                                    Past Surgical History Past Surgical History:  Procedure Laterality Date   ABDOMINAL HYSTERECTOMY     APPENDECTOMY     BILATERAL  BREAST BIOPSIES  BENIGN     CHOLECYSTECTOMY     COLONOSCOPY N/A 06/29/2015   SLF: The left colon is redundant 2. four colon polyps removed. No source  for change in bowel habits identified 3. Rectal bleeding due to large internal hemorrhoids    COLONOSCOPY W/ BIOPSIES  6 2010   Dr. Oneida Alar: Polypoid appearing lesion of the ascending colon, 3 mm sessile rectal polyp, small internal hemorrhoids. Pathology revealed polypoid mucosa, no adenomatous changes   Double-balloon enteroscopy, antegrade  April 2011   Dr. 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   ENTEROSCOPY N/A 06/02/2022   Procedure: ENTEROSCOPY;  Surgeon: Daneil Dolin, MD;  [facility]: [facility];  Service: Endoscopy;  Laterality: N/A;  with pediatric colonoscope   ESOPHAGOGASTRODUODENOSCOPY  04/2009   Dr. Oneida Alar: Patent Schatzki ring dilated with advancing the scope, patchy erythema in the antrum, 2 small AVMs in the duodenal bulb, 2 additional AVMs noted in the second portion the duodenum, mild gastritis on pathology   ESOPHAGOGASTRODUODENOSCOPY N/A 06/29/2015   SLF: 1. 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 small hiatal hernia 3. Mild non-erosive gastririts- NO obvious source for dyspepsia identified.    ESOPHAGOGASTRODUODENOSCOPY N/A 03/13/2020   Fields: Low-grade narrowing Schatzki ring, small hiatal hernia, multiple nonbleeding angiectasia's in the jejunum treated with APC.  Spot tattoo in jejunum at 90 cm from the incisors.   ESOPHAGOGASTRODUODENOSCOPY N/A 06/02/2022   Procedure: ESOPHAGOGASTRODUODENOSCOPY (EGD);  Surgeon: Daneil Dolin, MD;  Location: [facility];  Service: Endoscopy;  Laterality: N/A;   EYE SURGERY     HOT HEMOSTASIS  06/02/2022   Procedure: HOT HEMOSTASIS (ARGON PLASMA COAGULATION/BICAP);  Surgeon: Daneil Dolin, MD;  Location: [facility];  Service: Endoscopy;;   Family History Family History  Problem Relation Age of Onset   Heart failure Mother    Diabetes Sister     Heart attack Father    Colon cancer Neg Hx    Gastric cancer Neg Hx    Esophageal cancer Neg Hx     Social History Social History   Tobacco Use   Smoking status: Former    Packs/day: 1.00    Years: 25.00    Total pack years: 25.00    Types: Cigarettes    Quit date: 09/14/2007    Years since quitting: 66.3  Smokeless tobacco: Never  Vaping Use   Vaping Use: Never used  Substance Use Topics   Alcohol use: Not Currently    Comment: occasional/rare (Brandy every now and then)   Drug use: No   Allergies Latex, Sulfa antibiotics, Other, and Pravastatin sodium  Review of Systems Review of Systems  Constitutional:  Positive for fatigue.  Gastrointestinal:  Positive for abdominal pain.    Physical Exam Vital Signs  I have reviewed the triage vital signs BP (!) 146/94   Pulse 69   Temp 98.3 F (36.8 C) (Oral)   Resp 16   Ht 5' 4"$  (1.626 m)   Wt 60 kg   SpO2 98%   BMI 22.71 kg/m   Physical Exam Vitals and nursing note reviewed.  Constitutional:      General: She is not in acute distress.    Appearance: She is well-developed.  HENT:     Head: Normocephalic and atraumatic.  Eyes:     Conjunctiva/sclera: Conjunctivae normal.  Cardiovascular:     Rate and Rhythm: Normal rate and regular rhythm.     Heart sounds: No murmur heard. Pulmonary:     Effort: Pulmonary effort is normal. No respiratory distress.     Breath sounds: Normal breath sounds.  Abdominal:     Palpations: Abdomen is soft.     Tenderness: There is abdominal tenderness in the left upper quadrant.  Musculoskeletal:        General: No swelling.     Cervical back: Neck supple.  Skin:    General: Skin is warm and dry.     Capillary Refill: Capillary refill takes less than 2 seconds.  Neurological:     Mental Status: She is alert.  Psychiatric:        Mood and Affect: Mood normal.     ED Results and Treatments Labs (all labs ordered are listed, but only abnormal results are  displayed) Labs Reviewed  CBC WITH DIFFERENTIAL/PLATELET - Abnormal; Notable for the following components:      Result Value   RBC 3.57 (*)    Hemoglobin 10.5 (*)    HCT 34.8 (*)    RDW 22.3 (*)    All other components within normal limits  COMPREHENSIVE METABOLIC PANEL - Abnormal; Notable for the following components:   GFR, Estimated 60 (*)    All other components within normal limits  LIPASE, BLOOD - Abnormal; Notable for the following components:   Lipase 74 (*)    All other components within normal limits  URINALYSIS, ROUTINE W REFLEX MICROSCOPIC - Abnormal; Notable for the following components:   Leukocytes,Ua TRACE (*)    All other components within normal limits  RESP PANEL BY RT-PCR (RSV, FLU A&B, COVID)  RVPGX2                                                                                                                          Radiology CT ABDOMEN PELVIS W CONTRAST  Result Date:  12/29/2022 CLINICAL DATA:  Epigastric pain EXAM: CT ABDOMEN AND PELVIS WITH CONTRAST TECHNIQUE: Multidetector CT imaging of the abdomen and pelvis was performed using the standard protocol following bolus administration of intravenous contrast. RADIATION DOSE REDUCTION: This exam was performed according to the departmental dose-optimization program which includes automated exposure control, adjustment of the mA and/or kV according to patient size and/or use of iterative reconstruction technique. CONTRAST:  126m OMNIPAQUE IOHEXOL 300 MG/ML  SOLN COMPARISON:  CT abdomen and pelvis 11/06/2021 FINDINGS: Lower chest: No acute abnormality. Hepatobiliary: Gallbladder is surgically absent. Mild prominence of bile ducts appears stable. No focal liver lesion identified. Pancreas: Unremarkable. No pancreatic ductal dilatation or surrounding inflammatory changes. Spleen: Normal in size without focal abnormality. Adrenals/Urinary Tract: There is a subcentimeter cyst in the inferior pole the right kidney. Otherwise,  the kidneys, adrenal glands and bladder are within normal limits. Stomach/Bowel: Stomach is within normal limits. Appendix is not seen. No evidence of bowel wall thickening, distention, or inflammatory changes. There is sigmoid colon diverticulosis. Vascular/Lymphatic: Aortic atherosclerosis. No enlarged abdominal or pelvic lymph nodes. Reproductive: Status post hysterectomy. No adnexal masses. Other: No abdominal wall hernia or abnormality. No abdominopelvic ascites. Musculoskeletal: Degenerative changes affect the spine. IMPRESSION: 1. No acute localizing process in the abdomen or pelvis. 2. Sigmoid colon diverticulosis. 3. Subcentimeter right Bosniak I benign renal cyst. No follow-up imaging is recommended. JACR 2018 Feb; 264-273, Management of the Incidental Renal Mass on CT, [employer] 2021; [id number], Bosniak Classification of Cystic Renal Masses, Version 2019. Aortic Atherosclerosis (ICD10-I70.0). Electronically Signed   By: ARonney AstersM.D.   On: 12/29/2022 15:07    Pertinent labs & imaging results that were available during my care of the patient were reviewed by me and considered in my medical decision making (see MDM for details).  Medications Ordered in ED Medications  lactated ringers bolus 1,000 mL (0 mLs Intravenous Stopped 12/29/22 1430)  iohexol (OMNIPAQUE) 300 MG/ML solution 100 mL (100 mLs Intravenous Contrast Given 12/29/22 1440)                                                                                                                                     Procedures Procedures  (including critical care time)  Medical Decision Making / ED Course   This patient presents to the ED for concern of fatigue, epigastric pain, this involves an extensive number of treatment options, and is a complaint that carries with it a high risk of complications and morbidity.  The differential diagnosis includes GERD/gastritis, peptic ulcer disease, pancreatitis, gastroparesis, viral URI,  pleurisy, pericarditis  MDM: Patient seen emergency room for evaluation of fatigue and epigastric abdominal pain.  Physical exam with some very mild left upper quadrant pain but is otherwise unremarkable.  Laboratory evaluation with a hemoglobin of 10.5 which is improved from baseline but is otherwise unremarkable.  Urinalysis without evidence of infection.  Lipase minimally elevated at 70 but follow-up  CT does not show any acute pathology in the abdomen including no evidence of pancreatitis.  On my reevaluation after fluid resuscitation, patient was requesting to eat and was eating a sandwich without any difficulty.  She states that she feels improved and at this time she does not meet inpatient criteria for admission.  Patient then discharged with outpatient follow-up.   Additional history obtained:  -External records from outside source obtained and reviewed including: Chart review including previous notes, labs, imaging, consultation notes   Lab Tests: -I ordered, reviewed, and interpreted labs.   The pertinent results include:   Labs Reviewed  CBC WITH DIFFERENTIAL/PLATELET - Abnormal; Notable for the following components:      Result Value   RBC 3.57 (*)    Hemoglobin 10.5 (*)    HCT 34.8 (*)    RDW 22.3 (*)    All other components within normal limits  COMPREHENSIVE METABOLIC PANEL - Abnormal; Notable for the following components:   GFR, Estimated 60 (*)    All other components within normal limits  LIPASE, BLOOD - Abnormal; Notable for the following components:   Lipase 74 (*)    All other components within normal limits  URINALYSIS, ROUTINE W REFLEX MICROSCOPIC - Abnormal; Notable for the following components:   Leukocytes,Ua TRACE (*)    All other components within normal limits  RESP PANEL BY RT-PCR (RSV, FLU A&B, COVID)  [id number]     Imaging Studies ordered: I ordered imaging studies including CTAP I independently visualized and interpreted imaging. I agree with the  radiologist interpretation   Medicines ordered and prescription drug management: Meds ordered this encounter  Medications   lactated ringers bolus 1,000 mL   iohexol (OMNIPAQUE) 300 MG/ML solution 100 mL    -I have reviewed the patients home medicines and have made adjustments as needed  Critical interventions none   Cardiac Monitoring: The patient was maintained on a cardiac monitor.  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   ANTEGRADE BALLOON ENTEROSCOPY W/ APC IN 2011 [facility]   Bronchitis january 2014   Hiatal hernia    High cholesterol    History of contact dermatitis    Hx: recurrent pneumonia    Hypertension    IDA (iron deficiency anemia)    chronic iron infusions   Reflux    Vertigo       Dispostion: I considered admission for this patient, this time she does not meet inpatient criteria for admission she is safe for discharge and outpatient follow-up     Final Clinical Impression(s) / ED Diagnoses Final diagnoses:  Epigastric pain     @PCDICTATION$ @    Teressa Lower, MD 12/29/22 2007

## 2022-12-29 NOTE — ED Triage Notes (Signed)
Patient says she has ULQ pain for a couple weeks now. She has been seen for this before. Patient was at [facility] family practice and medics were called to bring her to the ER. Patient denies any N/V/D

## 2022-12-29 NOTE — ED Notes (Signed)
Pt walked to the BR without any problems, pt used her cane to get to the BR. Pt back on the stretcher

## 2022-12-29 NOTE — ED Notes (Signed)
Patient provided a food try

## 2023-01-23 ENCOUNTER — Inpatient Hospital Stay: Payer: 59 | Attending: Hematology

## 2023-01-23 DIAGNOSIS — Z90710 Acquired absence of both cervix and uterus: Secondary | ICD-10-CM | POA: Insufficient documentation

## 2023-01-23 DIAGNOSIS — R5383 Other fatigue: Secondary | ICD-10-CM | POA: Diagnosis not present

## 2023-01-23 DIAGNOSIS — I10 Essential (primary) hypertension: Secondary | ICD-10-CM | POA: Insufficient documentation

## 2023-01-23 DIAGNOSIS — R630 Anorexia: Secondary | ICD-10-CM | POA: Diagnosis not present

## 2023-01-23 DIAGNOSIS — Z87891 Personal history of nicotine dependence: Secondary | ICD-10-CM | POA: Diagnosis not present

## 2023-01-23 DIAGNOSIS — K909 Intestinal malabsorption, unspecified: Secondary | ICD-10-CM | POA: Diagnosis not present

## 2023-01-23 DIAGNOSIS — E559 Vitamin D deficiency, unspecified: Secondary | ICD-10-CM | POA: Diagnosis not present

## 2023-01-23 DIAGNOSIS — D508 Other iron deficiency anemias: Secondary | ICD-10-CM | POA: Insufficient documentation

## 2023-01-23 DIAGNOSIS — D500 Iron deficiency anemia secondary to blood loss (chronic): Secondary | ICD-10-CM

## 2023-01-23 LAB — COMPREHENSIVE METABOLIC PANEL
ALT: 12 U/L (ref 0–44)
AST: 18 U/L (ref 15–41)
Albumin: 3.9 g/dL (ref 3.5–5.0)
Alkaline Phosphatase: 61 U/L (ref 38–126)
Anion gap: 9 (ref 5–15)
BUN: 13 mg/dL (ref 8–23)
CO2: 25 mmol/L (ref 22–32)
Calcium: 8.9 mg/dL (ref 8.9–10.3)
Chloride: 109 mmol/L (ref 98–111)
Creatinine, Ser: 0.85 mg/dL (ref 0.44–1.00)
GFR, Estimated: >60 mL/min (ref >60)
Glucose, Bld: 102 mg/dL — ABNORMAL HIGH (ref 70–99)
Potassium: 3.6 mmol/L (ref 3.5–5.1)
Sodium: 143 mmol/L (ref 135–145)
Total Bilirubin: 0.4 mg/dL (ref 0.3–1.2)
Total Protein: 7 g/dL (ref 6.5–8.1)

## 2023-01-23 LAB — CBC WITH DIFFERENTIAL/PLATELET
Abs Immature Granulocytes: 0.01 10*3/uL (ref 0.00–0.07)
Basophils Absolute: 0 10*3/uL (ref 0.0–0.1)
Basophils Relative: 0 %
Eosinophils Absolute: 0 10*3/uL (ref 0.0–0.5)
Eosinophils Relative: 1 %
HCT: 35.2 % — ABNORMAL LOW (ref 36.0–46.0)
Hemoglobin: 10.7 g/dL — ABNORMAL LOW (ref 12.0–15.0)
Immature Granulocytes: 0 %
Lymphocytes Relative: 13 %
Lymphs Abs: 0.6 10*3/uL — ABNORMAL LOW (ref 0.7–4.0)
MCH: 29.6 pg (ref 26.0–34.0)
MCHC: 30.4 g/dL (ref 30.0–36.0)
MCV: 97.5 fL (ref 80.0–100.0)
Monocytes Absolute: 0.4 10*3/uL (ref 0.1–1.0)
Monocytes Relative: 8 %
Neutro Abs: 3.7 10*3/uL (ref 1.7–7.7)
Neutrophils Relative %: 78 %
Platelets: 241 10*3/uL (ref 150–400)
RBC: 3.61 MIL/uL — ABNORMAL LOW (ref 3.87–5.11)
RDW: 18.5 % — ABNORMAL HIGH (ref 11.5–15.5)
WBC: 4.7 10*3/uL (ref 4.0–10.5)
nRBC: 0 % (ref 0.0–0.2)

## 2023-01-23 LAB — FERRITIN: Ferritin: 88 ng/mL (ref 11–307)

## 2023-01-23 LAB — IRON AND TIBC
Iron: 34 ug/dL (ref 28–170)
Saturation Ratios: 13 % (ref 10.4–31.8)
TIBC: 254 ug/dL (ref 250–450)
UIBC: 220 ug/dL

## 2023-01-27 ENCOUNTER — Inpatient Hospital Stay (HOSPITAL_BASED_OUTPATIENT_CLINIC_OR_DEPARTMENT_OTHER): Payer: 59 | Admitting: Physician Assistant

## 2023-01-27 VITALS — BP 126/75 | HR 74 | Temp 98.7°F | Resp 16 | Wt 130.7 lb

## 2023-01-27 DIAGNOSIS — D508 Other iron deficiency anemias: Secondary | ICD-10-CM

## 2023-01-27 DIAGNOSIS — Z90710 Acquired absence of both cervix and uterus: Secondary | ICD-10-CM

## 2023-01-27 DIAGNOSIS — R634 Abnormal weight loss: Secondary | ICD-10-CM | POA: Diagnosis not present

## 2023-01-27 DIAGNOSIS — Z87891 Personal history of nicotine dependence: Secondary | ICD-10-CM

## 2023-01-27 DIAGNOSIS — D500 Iron deficiency anemia secondary to blood loss (chronic): Secondary | ICD-10-CM

## 2023-01-27 DIAGNOSIS — E538 Deficiency of other specified B group vitamins: Secondary | ICD-10-CM | POA: Diagnosis not present

## 2023-01-27 DIAGNOSIS — I10 Essential (primary) hypertension: Secondary | ICD-10-CM

## 2023-01-27 DIAGNOSIS — T452X1A Poisoning by vitamins, accidental (unintentional), initial encounter: Secondary | ICD-10-CM | POA: Diagnosis not present

## 2023-01-27 NOTE — Progress Notes (Signed)
[place], [place] [address]   CLINIC:  Medical Oncology/Hematology  PCP:  [name], MD [address] [place] [address] [phone]   REASON FOR VISIT:  Follow-up for iron deficiency anemia   CURRENT THERAPY: Intermittent IV iron infusions   INTERVAL HISTORY:   [name] 82 y.o. female returns for routine follow-up of iron deficiency anemia.  She was last seen by Tarri Abernethy PA-C on 11/25/2022.  She received [name] x 3 in January 2024.  At today's visit, she reports feeling fair.  No recent hospitalizations, surgeries, or changes in baseline health status.  She reports that she is still having intermittent black bowel movements, but is unsure of when her last episode of melena was.   She denies any other bleeding such as epistaxis, hematemesis, hematuria, or hematochezia.  She reports that her energy is fair. She has intermittent headaches.  She denies any pica, chest pain, dyspnea on exertion, lightheadedness, or syncope.     She has 50% energy and 50% appetite.  She has had some mild ongoing weight loss that she attributes to poor appetite.  She stopped drinking her Ensure a few months ago, and has lost about 5 pounds in the past 6 months.  ASSESSMENT & PLAN:  1.  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  She presented to ED on with acute on chronic weakness and fatigue, found to be hemoccult positive with Hgb 8.3 (dropped from 10.0 two weeks prior).  She received 1 unit PRBC.   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  Renal function normal for age. - Last  [name] x 16 November 2022 - Most recent labs (01/23/2023): Hgb 10.7/MCV 97.5, platelets 241, lymphocyte 0.6.  Ferritin 88, iron saturation 13 %. - She continues to report dark stools, about once every 1-2 weeks    - She is symptomatic with fatigue      - She is followed by the GI providers at [facility] - PLAN: Recommend IV [name] x 2 - Repeat labs and RTC in 3 months   - Follow-up with GI as scheduled   2.  Vitamin D deficiency, with subsequent iatrogenic toxicity: - She was instructed at prior appointments to stop taking vitamin D due to elevated levels, but she does not think that she did. - Vitamin D (12/20/2021) remained elevated at 142.55 - she was once again instructed to stop taking her vitamin D, but she has continued to take it.   -Prior labs (05/03/2022) showed significantly elevated vitamin D at 193.00, but labs today (06/02/2022) show vitamin D trending down to 143.62 - Vitamin D normalized as of 08/18/2022 with levels at 84.04 - Most recent labs (11/18/2022) with normal vitamin D 58.15 - PLAN: No vitamin D supplement at this time. - We will restart her on the lower dose if appropriate in the future.  Recheck vitamin D level at next visit    3.  Borderline vitamin B12 deficiency: - Labs on 6/20/202 08/18/2022 3 show vitamin B12 at 691 with normal methylmalonic acid - She is not sure if she is taking  her B12 supplement or not, but it is listed on her medication list from her pharmacy - PLAN: Encouraged her to continue vitamin B12 supplement.  We will check B12 and methylmalonic acid at next office visit    4.  Poor appetite and weight loss     - Reports decreased appetite for the past several months - She has noted to have increasing memory deficits, and there is some concern that she may be forgetting to eat - PLAN: Patient instructed to continue drinking 1-2 Ensure/Boost beverages daily. - We will check weight at follow-up appointment in 2 months.  PLAN  SUMMARY: >> IV [name] x 2 >> Labs in 3 months = CBC/D, CMP, ferritin, iron/TIBC, B12, MMA, vitamin D >> OFFICE visit in 3 months (1 week after labs)    REVIEW OF SYSTEMS:   Review of Systems  Constitutional:  Positive for appetite change and fatigue. Negative for chills, diaphoresis, fever and unexpected weight change.  HENT:   Negative for lump/mass and nosebleeds.   Eyes:  Negative for eye problems.  Respiratory:  Negative for cough, hemoptysis and shortness of breath.   Cardiovascular:  Negative for chest pain, leg swelling and palpitations.  Gastrointestinal:  Positive for constipation. Negative for abdominal pain, blood in stool, diarrhea, nausea and vomiting.  Genitourinary:  Negative for hematuria.   Musculoskeletal:  Positive for arthralgias (knees).  Skin: Negative.   Neurological:  Positive for headaches. Negative for dizziness and light-headedness.  Hematological:  Does not bruise/bleed easily.  Psychiatric/Behavioral:  Positive for confusion and sleep disturbance. Negative for depression. The patient is not nervous/anxious.      PHYSICAL EXAM:  ECOG PERFORMANCE STATUS: 1 - Symptomatic but completely ambulatory  There were no vitals filed for this visit. There were no vitals filed for this visit. Physical Exam Constitutional:      Appearance: Normal appearance. She is obese.  HENT:     Head: Normocephalic and atraumatic.     Mouth/Throat:     Mouth: Mucous membranes are moist.  Eyes:     Extraocular Movements: Extraocular movements intact.     Pupils: Pupils are equal, round, and reactive to light.  Cardiovascular:     Rate and Rhythm: Normal rate and regular rhythm.     Pulses: Normal pulses.     Heart sounds: Normal heart sounds.  Pulmonary:     Effort: Pulmonary effort is normal.     Breath sounds: Normal breath sounds.  Abdominal:     General: Bowel sounds are normal.     Palpations: Abdomen is soft.     Tenderness: There is no abdominal tenderness.   Musculoskeletal:        General: No swelling.     Right lower leg: No edema.     Left lower leg: No edema.  Lymphadenopathy:     Cervical: No cervical adenopathy.  Skin:    General: Skin is warm and dry.  Neurological:     General: No focal deficit present.     Mental Status: She is alert. Mental status is at baseline.  Psychiatric:        Mood and Affect: Mood normal.        Behavior: Behavior normal.        Cognition and Memory: Cognition is impaired. Memory is impaired.    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 four colon polyps removed. No source  for change in bowel habits identified 3. Rectal bleeding due to large internal hemorrhoids    COLONOSCOPY W/ BIOPSIES  6 2010   Dr. Oneida Alar: Polypoid appearing lesion of the ascending colon, 3 mm sessile rectal polyp, small internal hemorrhoids. Pathology revealed polypoid mucosa, no adenomatous changes   Double-balloon enteroscopy, antegrade  April 2011   Dr. 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   ENTEROSCOPY N/A 06/02/2022   Procedure: ENTEROSCOPY;  Surgeon: Daneil Dolin, MD;  [facility];  Service: Endoscopy;  Laterality: N/A;  with pediatric colonoscope   ESOPHAGOGASTRODUODENOSCOPY  04/2009   Dr. Oneida Alar: Patent Schatzki ring dilated with advancing the scope,  patchy erythema in the antrum, 2 small AVMs in the duodenal bulb, 2 additional AVMs noted in the second portion the duodenum, mild gastritis on pathology   ESOPHAGOGASTRODUODENOSCOPY N/A 06/29/2015   SLF: 1. 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 small hiatal hernia 3. Mild non-erosive gastririts- NO obvious source for dyspepsia identified.    ESOPHAGOGASTRODUODENOSCOPY N/A 03/13/2020   Fields: Low-grade narrowing Schatzki ring, small hiatal hernia, multiple nonbleeding angiectasia's in the jejunum treated with APC.  Spot tattoo in jejunum at 90 cm from the incisors.   ESOPHAGOGASTRODUODENOSCOPY N/A 06/02/2022   Procedure: ESOPHAGOGASTRODUODENOSCOPY (EGD);  Surgeon: Daneil Dolin, MD;  Location: [facility];  Service: Endoscopy;  Laterality: N/A;   EYE SURGERY     HOT HEMOSTASIS  06/02/2022   Procedure: HOT HEMOSTASIS (ARGON PLASMA COAGULATION/BICAP);  Surgeon: Daneil Dolin, MD;  Location: [facility];  Service: Endoscopy;;    SOCIAL HISTORY:  Social History   Socioeconomic History   Marital status: Divorced    Spouse name: Not on file   Number of children: 1   Years of education: Not on file   Highest education level: Not on file  Occupational History   Not on file  Tobacco Use   Smoking status: Former    Packs/day: 1.00    Years: 25.00    Additional pack years: 0.00    Total pack years: 25.00    Types: Cigarettes    Quit date: 09/14/2007    Years since quitting: 15.3   Smokeless tobacco: Never  Vaping Use   Vaping Use: Never used  Substance and Sexual Activity   Alcohol use: Not Currently    Comment: occasional/rare (Brandy every now and then)   Drug use: No   Sexual activity: Not Currently    Birth control/protection: Post-menopausal, Surgical  Other Topics Concern   Not on file  Social History Narrative   Not on file   Social Determinants of Health   Financial Resource Strain: Not on file  Food Insecurity: Not on file  Transportation Needs: Not on  file  Physical Activity: Not on file  Stress: Not on file  Social Connections: Not on file  Intimate Partner Violence: Not on file    FAMILY HISTORY:  Family History  Problem Relation Age of Onset   Heart failure Mother    Diabetes Sister    Heart attack Father    Colon cancer Neg Hx    Gastric cancer Neg Hx    Esophageal cancer Neg Hx     CURRENT MEDICATIONS:  Outpatient Encounter Medications as of 01/27/2023  Medication Sig   acetaminophen (TYLENOL) 500 MG tablet Take 1,000 mg by mouth every 6 (six) hours as needed for moderate pain.   Acetaminophen 325 MG CAPS Take by mouth.   amitriptyline (ELAVIL) 25 MG tablet Take 25 mg by mouth at bedtime.   aspirin EC 325 MG tablet Take 325 mg by mouth daily.   atorvastatin (LIPITOR) 40 MG tablet Take 40 mg by mouth daily.   Cyanocobalamin (VITAMIN B-12 CR) 1000 MCG TBCR Take 1 tablet (1,000 mcg total) by mouth daily.   escitalopram (LEXAPRO) 10 MG tablet Take 10 mg by mouth daily.   ezetimibe (ZETIA) 10 MG tablet Take 10 mg by mouth daily.   mirtazapine (REMERON) 15 MG tablet Take 15 mg by mouth at bedtime.   omega-3 fish oil (MAXEPA) 1000 MG CAPS capsule Take 1 capsule by mouth daily.   ondansetron (ZOFRAN-ODT) 4 MG disintegrating tablet Take 1 tablet (4 mg total) by mouth every 8 (eight) hours as needed.   pantoprazole (PROTONIX) 40 MG tablet Take 40 mg by mouth 2 (two) times daily.   potassium chloride SA (KLOR-CON M) 20 MEQ tablet Take 20 mEq by mouth daily.   No facility-administered encounter medications on file as of 01/27/2023.    ALLERGIES:  Allergies  Allergen Reactions   Latex Hives, Itching and Rash   Sulfa Antibiotics Hives and Itching    Other reaction(s): hives   Other Hives   Pravastatin Sodium Other (See Comments)    LABORATORY DATA:  I have reviewed the labs as listed.  CBC    Component Value Date/Time   WBC 4.7 01/23/2023 1207   RBC 3.61 (L) 01/23/2023 1207   HGB 10.7 (L) 01/23/2023 1207   HCT 35.2 (L)  01/23/2023 1207   PLT 241 01/23/2023 1207   MCV 97.5 01/23/2023 1207   MCH 29.6 01/23/2023 1207   MCHC 30.4 01/23/2023 1207   RDW 18.5 (H) 01/23/2023 1207   LYMPHSABS 0.6 (L) 01/23/2023 1207   MONOABS 0.4 01/23/2023 1207   EOSABS 0.0 01/23/2023 1207   BASOSABS 0.0 01/23/2023 1207      Latest Ref Rng & Units 01/23/2023   12:07 PM 12/29/2022   12:14 PM 11/04/2022   12:07 PM  CMP  Glucose 70 - 99 mg/dL 102  83  89   BUN 8 - 23 mg/dL 13  10  8    Creatinine 0.44 - 1.00 mg/dL 0.85  0.94  0.92   Sodium 135 - 145 mmol/L 143  140  141   Potassium 3.5 - 5.1 mmol/L 3.6  4.1  3.9   Chloride 98 - 111 mmol/L 109  105  109   CO2 22 - 32 mmol/L 25  27  25    Calcium 8.9 - 10.3 mg/dL 8.9  9.4  9.3   Total Protein 6.5 - 8.1 g/dL 7.0  7.2    Total Bilirubin 0.3 - 1.2 mg/dL 0.4  0.4    Alkaline Phos 38 - 126 U/L 61  65    AST 15 - 41 U/L 18  19    ALT 0 - 44 U/L 12  13      DIAGNOSTIC IMAGING:  I have independently reviewed the relevant imaging and discussed with the patient.   WRAP UP:  All questions were answered. The patient knows to call the clinic with any problems, questions or concerns.  Medical decision making: Moderate  Time spent on visit: I spent 20 minutes counseling the patient face to face. The total time spent in the appointment was 30 minutes and more than 50% was on counseling.  Harriett Rush, PA-C  01/27/23 10:55 AM

## 2023-01-27 NOTE — Patient Instructions (Addendum)
[place] at [facility] Discharge Instructions  You were seen today by Tarri Abernethy PA-C for your iron deficiency anemia.  Your blood and iron levels are low, so we will schedule you for IV IRON x 2 doses.  You should also continue to follow-up with your gastroenterologist [facility] doctor) for ongoing monitoring and management of your intestinal blood loss.     You should CONTINUE taking over-the-counter vitamin B12 supplement 1000 mcg daily  You should CONTINUE drinking Ensure (or Boost) 1-2 drinks daily due to your decreased appetite and weight loss.       IV IRON SCHEDULE  ________________________  ________________________      LABS: Return in 3 months for repeat labs on ______________________      FOLLOW-UP APPOINTMENT: Office visit on ______________________    ** Thank you for trusting me with your healthcare!  I strive to provide all of my patients with quality care at each visit.  If you receive a survey for this visit, I would be so grateful to you for taking the time to provide feedback.  Thank you in advance!  ~ Emylee Decelle                   Dr. Derek Jack   &   Tarri Abernethy, PA-C   - - - - - - - - - - - - - - - - - -     Thank you for choosing [place] at [facility] to provide your oncology and hematology care.  To afford each patient quality time with our provider, please arrive at least 15 minutes before your scheduled appointment time.   If you have a lab appointment with the [facility] please come in thru the Main Entrance and check in at the main information desk.  You need to re-schedule your appointment should you arrive 10 or more minutes late.  We strive to give you quality time with our providers, and arriving late affects you and other patients whose appointments are after yours.  Also, if you no show three or more times for appointments you may be dismissed from the clinic at the  providers discretion.     Again, thank you for choosing [facility].  Our hope is that these requests will decrease the amount of time that you wait before being seen by our physicians.       _____________________________________________________________  Should you have questions after your visit to [facility], please contact our office at [phone] and follow the prompts.  Our office hours are 8:00 a.m. and 4:30 p.m. Monday - Friday.  Please note that voicemails left after 4:00 p.m. may not be returned until the following business day.  We are closed weekends and major holidays.  You do have access to a nurse 24-7, just call the main number to the clinic [phone] and do not press any options, hold on the line and a nurse will answer the phone.    For prescription refill requests, have your pharmacy contact our office and allow 72 hours.    Due to Covid, you will need to wear a mask upon entering the hospital. If you do not have a mask, a mask will be given to you at the Main Entrance upon arrival. For doctor visits, patients may have 1 support person age 8 or older with them. For treatment visits, patients can not have anyone with them due to  social distancing guidelines and our immunocompromised population.

## 2023-01-30 ENCOUNTER — Ambulatory Visit: Payer: 59 | Admitting: Physician Assistant

## 2023-01-30 ENCOUNTER — Other Ambulatory Visit: Payer: Self-pay

## 2023-01-30 DIAGNOSIS — T452X1A Poisoning by vitamins, accidental (unintentional), initial encounter: Secondary | ICD-10-CM

## 2023-01-30 DIAGNOSIS — D649 Anemia, unspecified: Secondary | ICD-10-CM

## 2023-01-30 DIAGNOSIS — E538 Deficiency of other specified B group vitamins: Secondary | ICD-10-CM

## 2023-01-30 DIAGNOSIS — D500 Iron deficiency anemia secondary to blood loss (chronic): Secondary | ICD-10-CM

## 2023-02-01 ENCOUNTER — Inpatient Hospital Stay: Payer: 59

## 2023-02-01 VITALS — BP 118/72 | HR 57 | Temp 97.8°F | Resp 16

## 2023-02-01 DIAGNOSIS — D508 Other iron deficiency anemias: Secondary | ICD-10-CM | POA: Diagnosis not present

## 2023-02-01 DIAGNOSIS — D500 Iron deficiency anemia secondary to blood loss (chronic): Secondary | ICD-10-CM

## 2023-02-01 MED ORDER — ACETAMINOPHEN 325 MG PO TABS
650.0000 mg | ORAL_TABLET | Freq: Once | ORAL | Status: AC
  Administered 2023-02-01: 650 mg via ORAL
  Filled 2023-02-01: qty 2

## 2023-02-01 MED ORDER — SODIUM CHLORIDE 0.9 % IV SOLN: Freq: Once | INTRAVENOUS | Status: AC

## 2023-02-01 MED ORDER — LORATADINE 10 MG PO TABS
10.0000 mg | ORAL_TABLET | Freq: Once | ORAL | Status: DC
  Filled 2023-02-01: qty 1

## 2023-02-01 MED ORDER — SODIUM CHLORIDE 0.9 % IV SOLN
510.0000 mg | Freq: Once | INTRAVENOUS | Status: AC
  Administered 2023-02-01: 510 mg via INTRAVENOUS
  Filled 2023-02-01: qty 510

## 2023-02-01 MED ORDER — CETIRIZINE HCL 10 MG PO TABS
10.0000 mg | ORAL_TABLET | Freq: Once | ORAL | Status: AC
  Administered 2023-02-01: 10 mg via ORAL
  Filled 2023-02-01: qty 1

## 2023-02-01 NOTE — Progress Notes (Signed)
Patient presents today for [name] infusion.  Patient is in stable condition with no new complaints voiced.  Vital signs are stable.  IV placed in left AC. IV flushed well with good blood return noted.  We will proceed with infusion per provider orders.

## 2023-02-01 NOTE — Progress Notes (Signed)
Pharmacy has substituted cetirizine 10 mg orally x 1 as premedication for   Loratidine discontinued.  V.O. Dr Katragadda/Kortny Lirette, PharmD  

## 2023-02-01 NOTE — Patient Instructions (Signed)
[facility] [place]  Discharge Instructions: Thank you for choosing [facility] to provide your oncology and hematology care.  If you have a lab appointment with the Cancer Center, please come in thru the Main Entrance and check in at the main information desk.  Wear comfortable clothing and clothing appropriate for easy access to any Portacath or PICC line.   We strive to give you quality time with your provider. You may need to reschedule your appointment if you arrive late (15 or more minutes).  Arriving late affects you and other patients whose appointments are after yours.  Also, if you miss three or more appointments without notifying the office, you may be dismissed from the clinic at the provider's discretion.      For prescription refill requests, have your pharmacy contact our office and allow 72 hours for refills to be completed.    Today you received the following chemotherapy and/or immunotherapy agents [name]. Ferumoxytol Injection What is this medication? FERUMOXYTOL (FER ue MOX i tol) treats low levels of iron in your body (iron deficiency anemia). Iron is a mineral that plays an important role in making red blood cells, which carry oxygen from your lungs to the rest of your body. This medicine may be used for other purposes; ask your health care provider or pharmacist if you have questions. COMMON BRAND NAME(S): [name] What should I tell my care team before I take this medication? They need to know if you have any of these conditions: Anemia not caused by low iron levels High levels of iron in the blood Magnetic resonance imaging (MRI) test scheduled An unusual or allergic reaction to iron, other medications, foods, dyes, or preservatives Pregnant or trying to get pregnant Breastfeeding How should I use this medication? This medication is injected into a vein. It is given by your care team in a hospital or clinic setting. Talk to your care  team the use of this medication in children. Special care may be needed. Overdosage: If you think you have taken too much of this medicine contact a poison control center or emergency room at once. NOTE: This medicine is only for you. Do not share this medicine with others. What if I miss a dose? It is important not to miss your dose. Call your care team if you are unable to keep an appointment. What may interact with this medication? Other iron products This list may not describe all possible interactions. Give your health care provider a list of all the medicines, herbs, non-prescription drugs, or dietary supplements you use. Also tell them if you smoke, drink alcohol, or use illegal drugs. Some items may interact with your medicine. What should I watch for while using this medication? Visit your care team regularly. Tell your care team if your symptoms do not start to get better or if they get worse. You may need blood work done while you are taking this medication. You may need to follow a special diet. Talk to your care team. Foods that contain iron include: whole grains/cereals, dried fruits, beans, or peas, leafy green vegetables, and organ meats (liver, kidney). What side effects may I notice from receiving this medication? Side effects that you should report to your care team as soon as possible: Allergic reactions--skin rash, itching, hives, swelling of the face, lips, tongue, or throat Low blood pressure--dizziness, feeling faint or lightheaded, blurry vision Shortness of breath Side effects that usually do not require medical attention (report to your   care team if they continue or are bothersome): Flushing Headache Joint pain Muscle pain Nausea Pain, redness, or irritation at injection site This list may not describe all possible side effects. Call your doctor for medical advice about side effects. You may report side effects to FDA at 1-800-FDA-1088. Where should I keep my  medication? This medication is given in a hospital or clinic and will not be stored at home. NOTE: This sheet is a summary. It may not cover all possible information. If you have questions about this medicine, talk to your doctor, pharmacist, or health care provider.  2023 Elsevier/Gold Standard (2021-03-24 00:00:00)       To help prevent nausea and vomiting after your treatment, we encourage you to take your nausea medication as directed.  BELOW ARE SYMPTOMS THAT SHOULD BE REPORTED IMMEDIATELY: *FEVER GREATER THAN 100.4 F (38 C) OR HIGHER *CHILLS OR SWEATING *NAUSEA AND VOMITING THAT IS NOT CONTROLLED WITH YOUR NAUSEA MEDICATION *UNUSUAL SHORTNESS OF BREATH *UNUSUAL BRUISING OR BLEEDING *URINARY PROBLEMS (pain or burning when urinating, or frequent urination) *BOWEL PROBLEMS (unusual diarrhea, constipation, pain near the anus) TENDERNESS IN MOUTH AND THROAT WITH OR WITHOUT PRESENCE OF ULCERS (sore throat, sores in mouth, or a toothache) UNUSUAL RASH, SWELLING OR PAIN  UNUSUAL VAGINAL DISCHARGE OR ITCHING   Items with * indicate a potential emergency and should be followed up as soon as possible or go to the Emergency Department if any problems should occur.  Please show the CHEMOTHERAPY ALERT CARD or IMMUNOTHERAPY ALERT CARD at check-in to the Emergency Department and triage nurse.  Should you have questions after your visit or need to cancel or reschedule your appointment, please contact [facility] [phone]  and follow the prompts.  Office hours are 8:00 a.m. to 4:30 p.m. Monday - Friday. Please note that voicemails left after 4:00 p.m. may not be returned until the following business day.  We are closed weekends and major holidays. You have access to a nurse at all times for urgent questions. Please call the main number to the clinic [phone] and follow the prompts.  For any non-urgent questions, you may also contact your provider using MyChart. We now  offer e-Visits for anyone 18 and older to request care online for non-urgent symptoms. For details visit [email or web address].   Also download the MyChart app! Go to the app store, search "MyChart", open the app, select [facility], and log in with your MyChart username and password.   

## 2023-02-01 NOTE — Progress Notes (Signed)
[name] given today per MD orders. Tolerated infusion without adverse affects. Vital signs stable. No complaints at this time. Discharged from clinic ambulatory in stable condition. Alert and oriented x 3. F/U with [facility] as scheduled.  

## 2023-02-08 ENCOUNTER — Inpatient Hospital Stay: Payer: 59

## 2023-02-08 VITALS — BP 116/72 | HR 55 | Temp 97.9°F | Resp 16

## 2023-02-08 DIAGNOSIS — D508 Other iron deficiency anemias: Secondary | ICD-10-CM | POA: Diagnosis not present

## 2023-02-08 DIAGNOSIS — D500 Iron deficiency anemia secondary to blood loss (chronic): Secondary | ICD-10-CM

## 2023-02-08 MED ORDER — ACETAMINOPHEN 325 MG PO TABS
650.0000 mg | ORAL_TABLET | Freq: Once | ORAL | Status: AC
  Administered 2023-02-08: 650 mg via ORAL
  Filled 2023-02-08: qty 2

## 2023-02-08 MED ORDER — SODIUM CHLORIDE 0.9 % IV SOLN
510.0000 mg | Freq: Once | INTRAVENOUS | Status: AC
  Administered 2023-02-08: 510 mg via INTRAVENOUS
  Filled 2023-02-08: qty 510

## 2023-02-08 MED ORDER — CETIRIZINE HCL 10 MG PO TABS
10.0000 mg | ORAL_TABLET | Freq: Once | ORAL | Status: AC
  Administered 2023-02-08: 10 mg via ORAL
  Filled 2023-02-08: qty 1

## 2023-02-08 MED ORDER — SODIUM CHLORIDE 0.9 % IV SOLN: Freq: Once | INTRAVENOUS | Status: AC

## 2023-02-08 NOTE — Patient Instructions (Signed)
[facility]  Discharge Instructions: Thank you for choosing [facility] to provide your oncology and hematology care.  If you have a lab appointment with the Cancer Center, please come in thru the Main Entrance and check in at the main information desk.  Wear comfortable clothing and clothing appropriate for easy access to any Portacath or PICC line.   We strive to give you quality time with your provider. You may need to reschedule your appointment if you arrive late (15 or more minutes).  Arriving late affects you and other patients whose appointments are after yours.  Also, if you miss three or more appointments without notifying the office, you may be dismissed from the clinic at the provider's discretion.      For prescription refill requests, have your pharmacy contact our office and allow 72 hours for refills to be completed.    Today you received [name] IV iron infusion.     BELOW ARE SYMPTOMS THAT SHOULD BE REPORTED IMMEDIATELY: *FEVER GREATER THAN 100.4 F (38 C) OR HIGHER *CHILLS OR SWEATING *NAUSEA AND VOMITING THAT IS NOT CONTROLLED WITH YOUR NAUSEA MEDICATION *UNUSUAL SHORTNESS OF BREATH *UNUSUAL BRUISING OR BLEEDING *URINARY PROBLEMS (pain or burning when urinating, or frequent urination) *BOWEL PROBLEMS (unusual diarrhea, constipation, pain near the anus) TENDERNESS IN MOUTH AND THROAT WITH OR WITHOUT PRESENCE OF ULCERS (sore throat, sores in mouth, or a toothache) UNUSUAL RASH, SWELLING OR PAIN  UNUSUAL VAGINAL DISCHARGE OR ITCHING   Items with * indicate a potential emergency and should be followed up as soon as possible or go to the Emergency Department if any problems should occur.  Please show the CHEMOTHERAPY ALERT CARD or IMMUNOTHERAPY ALERT CARD at check-in to the Emergency Department and triage nurse.  Should you have questions after your visit or need to cancel or reschedule your appointment, please contact [facility] [phone]  and follow the prompts.  Office hours are 8:00 a.m. to 4:30 p.m. Monday - Friday. Please note that voicemails left after 4:00 p.m. may not be returned until the following business day.  We are closed weekends and major holidays. You have access to a nurse at all times for urgent questions. Please call the main number to the clinic [phone] and follow the prompts.  For any non-urgent questions, you may also contact your provider using MyChart. We now offer e-Visits for anyone 18 and older to request care online for non-urgent symptoms. For details visit [email or web address].   Also download the MyChart app! Go to the app store, search "MyChart", open the app, select [facility], and log in with your MyChart username and password.   

## 2023-02-08 NOTE — Progress Notes (Signed)
Patient presents today for iron infusion. Patient is in satisfactory condition with no new complaints voiced.  Vital signs are stable.  We will proceed with infusion per provider orders.    [name] given today per MD orders. Tolerated infusion without adverse affects. Vital signs stable. No complaints at this time. Discharged from clinic ambulatory with cane in stable condition. Alert and oriented x 3. F/U with [facility] as scheduled.

## 2023-05-05 ENCOUNTER — Inpatient Hospital Stay: Payer: 59 | Attending: Hematology

## 2023-05-05 DIAGNOSIS — R634 Abnormal weight loss: Secondary | ICD-10-CM | POA: Insufficient documentation

## 2023-05-05 DIAGNOSIS — E559 Vitamin D deficiency, unspecified: Secondary | ICD-10-CM | POA: Diagnosis not present

## 2023-05-05 DIAGNOSIS — D508 Other iron deficiency anemias: Secondary | ICD-10-CM | POA: Diagnosis present

## 2023-05-05 DIAGNOSIS — K909 Intestinal malabsorption, unspecified: Secondary | ICD-10-CM | POA: Insufficient documentation

## 2023-05-05 DIAGNOSIS — Z90710 Acquired absence of both cervix and uterus: Secondary | ICD-10-CM | POA: Diagnosis not present

## 2023-05-05 DIAGNOSIS — R5383 Other fatigue: Secondary | ICD-10-CM | POA: Insufficient documentation

## 2023-05-05 DIAGNOSIS — D649 Anemia, unspecified: Secondary | ICD-10-CM

## 2023-05-05 DIAGNOSIS — E538 Deficiency of other specified B group vitamins: Secondary | ICD-10-CM | POA: Diagnosis not present

## 2023-05-05 DIAGNOSIS — D500 Iron deficiency anemia secondary to blood loss (chronic): Secondary | ICD-10-CM

## 2023-05-05 DIAGNOSIS — Z87891 Personal history of nicotine dependence: Secondary | ICD-10-CM | POA: Insufficient documentation

## 2023-05-05 LAB — CBC WITH DIFFERENTIAL/PLATELET
Abs Immature Granulocytes: 0.02 10*3/uL (ref 0.00–0.07)
Basophils Absolute: 0 10*3/uL (ref 0.0–0.1)
Basophils Relative: 0 %
Eosinophils Absolute: 0.1 10*3/uL (ref 0.0–0.5)
Eosinophils Relative: 2 %
HCT: 35.4 % — ABNORMAL LOW (ref 36.0–46.0)
Hemoglobin: 11.3 g/dL — ABNORMAL LOW (ref 12.0–15.0)
Immature Granulocytes: 0 %
Lymphocytes Relative: 17 %
Lymphs Abs: 0.9 10*3/uL (ref 0.7–4.0)
MCH: 31.5 pg (ref 26.0–34.0)
MCHC: 31.9 g/dL (ref 30.0–36.0)
MCV: 98.6 fL (ref 80.0–100.0)
Monocytes Absolute: 0.5 10*3/uL (ref 0.1–1.0)
Monocytes Relative: 9 %
Neutro Abs: 3.6 10*3/uL (ref 1.7–7.7)
Neutrophils Relative %: 72 %
Platelets: 275 10*3/uL (ref 150–400)
RBC: 3.59 MIL/uL — ABNORMAL LOW (ref 3.87–5.11)
RDW: 13.5 % (ref 11.5–15.5)
WBC: 5.1 10*3/uL (ref 4.0–10.5)
nRBC: 0 % (ref 0.0–0.2)

## 2023-05-05 LAB — VITAMIN D 25 HYDROXY (VIT D DEFICIENCY, FRACTURES): Vit D, 25-Hydroxy: 41.51 ng/mL (ref 30–100)

## 2023-05-05 LAB — COMPREHENSIVE METABOLIC PANEL
ALT: 14 U/L (ref 0–44)
AST: 19 U/L (ref 15–41)
Albumin: 4 g/dL (ref 3.5–5.0)
Alkaline Phosphatase: 66 U/L (ref 38–126)
Anion gap: 8 (ref 5–15)
BUN: 9 mg/dL (ref 8–23)
CO2: 25 mmol/L (ref 22–32)
Calcium: 9.2 mg/dL (ref 8.9–10.3)
Chloride: 106 mmol/L (ref 98–111)
Creatinine, Ser: 0.87 mg/dL (ref 0.44–1.00)
GFR, Estimated: >60 mL/min (ref >60)
Glucose, Bld: 107 mg/dL — ABNORMAL HIGH (ref 70–99)
Potassium: 3.9 mmol/L (ref 3.5–5.1)
Sodium: 139 mmol/L (ref 135–145)
Total Bilirubin: 0.5 mg/dL (ref 0.3–1.2)
Total Protein: 7.4 g/dL (ref 6.5–8.1)

## 2023-05-05 LAB — FERRITIN: Ferritin: 110 ng/mL (ref 11–307)

## 2023-05-05 LAB — VITAMIN B12: Vitamin B-12: 655 pg/mL (ref 180–914)

## 2023-05-05 LAB — IRON AND TIBC
Iron: 33 ug/dL (ref 28–170)
Saturation Ratios: 12 % (ref 10.4–31.8)
TIBC: 268 ug/dL (ref 250–450)
UIBC: 235 ug/dL

## 2023-05-09 LAB — METHYLMALONIC ACID, SERUM: Methylmalonic Acid, Quantitative: 100 nmol/L (ref 0–378)

## 2023-05-09 NOTE — Progress Notes (Unsigned)
[facility] [address], [place] [address]   CLINIC:  Medical Oncology/Hematology  PCP:  Smith Robert, MD [address] [place] [address] [phone]   REASON FOR VISIT:  Follow-up for iron deficiency anemia   CURRENT THERAPY: Intermittent IV iron infusions   INTERVAL HISTORY:   Melody Mitchell 82 y.o. female returns for routine follow-up of iron deficiency anemia.  She was last seen by Rojelio Brenner PA-C on 01/27/2023.  She received [name] x2  in March 2024.  At today's visit, she reports feeling somewhat poorly due to a "head cold" for the past few days.  Otherwise, she had been feeling fairly well.  She denies any recent hospitalizations, surgeries, or changes in her baseline health status  She has not noticed any recent black bowel movements or melanotic stool that she can recall.  She denies any other bleeding such as epistaxis, hematemesis, hematuria, or hematochezia.  She reports that her energy is fair.  She has intermittent headaches.  She denies any pica, chest pain, dyspnea on exertion, lightheadedness, or syncope.     She has 65% energy and 70% appetite.  She has had some mild ongoing weight loss that she attributes to poor appetite.  Her weight today is stable compared to her last appointment, has been relatively stable over the past 6 months.  ASSESSMENT & PLAN:  1.  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  She presented to ED on with acute on chronic weakness and fatigue, found to be hemoccult positive with Hgb 8.3 (dropped from 10.0 two weeks prior).  She received 1 unit PRBC.   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  Renal function normal for age. - Last [name] x 2 in March 2024 - Most recent labs (05/05/2023): Hgb 11.3/MCV 98.6.  Ferritin 110, iron saturation 12%. - She does not recall the last time that she noticed any melanotic stool - She is symptomatic with fatigue      - She is followed by the GI providers at [facility] - PLAN: Recommend IV [name] x 1 - Repeat labs and RTC in 3 months   - Follow-up with GI as scheduled   2.  Vitamin D deficiency, with subsequent overload - Patient previously required vitamin D supplementation, but was mistakenly taking this too frequently and did not stop when instructed - Vitamin D peaked at 193.00 on 05/03/2022 - Most recent labs (05/05/2023) show vitamin D 41.51 - PLAN: No vitamin D supplement at this time. - We will restart her on the lower dose if appropriate in the future.  Recheck vitamin D level in about 6 months (around December 2024)    3.  Borderline vitamin B12 deficiency: - Labs on 05/05/2023 show vitamin B12 at 655 with normal methylmalonic acid - She is not sure if she is taking her B12 supplement or not, but it is listed on her medication list from her pharmacy - PLAN: Encouraged her to continue vitamin B12 supplement.  We will check B12 and methylmalonic acid in about 6 months (around December 2024)   4.  Poor appetite and  weight loss     - Reports decreased appetite for the past several months - She has noted to have increasing memory deficits, and there is some concern that she may be forgetting to eat - PLAN: Patient instructed to continue drinking 1-2 Ensure/Boost beverages daily. - We will check weight at follow-up appointment in 2 months.  PLAN SUMMARY: >> IV [name] x 1 >> Labs in 3 months = CBC/D, CMP, ferritin, iron/TIBC >> OFFICE visit in 3 months (1 week after labs)    REVIEW OF SYSTEMS:  Review of Systems  Constitutional:  Positive for appetite  change and fatigue. Negative for chills, diaphoresis, fever and unexpected weight change.  HENT:   Negative for lump/mass and nosebleeds.   Eyes:  Negative for eye problems.  Respiratory:  Negative for cough, hemoptysis and shortness of breath.   Cardiovascular:  Negative for chest pain, leg swelling and palpitations.  Gastrointestinal:  Positive for constipation. Negative for abdominal pain, blood in stool, diarrhea, nausea and vomiting.  Genitourinary:  Negative for hematuria.   Musculoskeletal:  Positive for arthralgias (knees).  Skin: Negative.   Neurological:  Positive for headaches. Negative for dizziness and light-headedness.  Hematological:  Does not bruise/bleed easily.  Psychiatric/Behavioral:  Positive for confusion and sleep disturbance. Negative for depression. The patient is not nervous/anxious.      PHYSICAL EXAM:  ECOG PERFORMANCE STATUS: 1 - Symptomatic but completely ambulatory  There were no vitals filed for this visit. There were no vitals filed for this visit. Physical Exam Constitutional:      Appearance: Normal appearance. She is obese.  HENT:     Head: Normocephalic and atraumatic.     Mouth/Throat:     Mouth: Mucous membranes are moist.  Eyes:     Extraocular Movements: Extraocular movements intact.     Pupils: Pupils are equal, round, and reactive to light.  Cardiovascular:     Rate and Rhythm: Normal rate and regular rhythm.     Pulses: Normal pulses.     Heart sounds: Normal heart sounds.  Pulmonary:     Effort: Pulmonary effort is normal.     Breath sounds: Normal breath sounds.  Abdominal:     General: Bowel sounds are normal.     Palpations: Abdomen is soft.     Tenderness: There is no abdominal tenderness.  Musculoskeletal:        General: No swelling.     Right lower leg: No edema.     Left lower leg: No edema.  Lymphadenopathy:     Cervical: No cervical adenopathy.  Skin:    General: Skin is warm and dry.  Neurological:     General:  No focal deficit present.     Mental Status: She is alert. Mental status is at baseline.  Psychiatric:        Mood and Affect: Mood normal.        Behavior: Behavior normal.        Cognition and Memory: Cognition is impaired. Memory is impaired.    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 four colon polyps removed. No source  for change in bowel habits identified 3. Rectal bleeding due to large internal hemorrhoids    COLONOSCOPY W/ BIOPSIES  6 2010   Dr. Darrick Penna: Polypoid appearing lesion of the ascending colon, 3 mm sessile rectal polyp, small internal hemorrhoids. Pathology revealed polypoid mucosa, no adenomatous changes   Double-balloon enteroscopy, antegrade  April 2011   Dr. 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   ENTEROSCOPY N/A 06/02/2022   Procedure: ENTEROSCOPY;  Surgeon: Corbin Ade, MD;  Location: [facility];  Service: Endoscopy;  Laterality: N/A;  with pediatric colonoscope   ESOPHAGOGASTRODUODENOSCOPY  04/2009   Dr. Darrick Penna: Patent Schatzki ring dilated with advancing the scope, patchy erythema in the antrum, 2 small AVMs in the duodenal bulb, 2 additional AVMs noted in the second portion the duodenum, mild gastritis on pathology   ESOPHAGOGASTRODUODENOSCOPY N/A 06/29/2015   SLF: 1. 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 small hiatal hernia 3. Mild non-erosive gastririts- NO obvious source for dyspepsia identified.    ESOPHAGOGASTRODUODENOSCOPY N/A 03/13/2020   Fields: Low-grade narrowing Schatzki ring, small hiatal hernia, multiple nonbleeding angiectasia's in the jejunum treated with APC.  Spot tattoo in jejunum at 90 cm from the incisors.   ESOPHAGOGASTRODUODENOSCOPY N/A 06/02/2022   Procedure: ESOPHAGOGASTRODUODENOSCOPY (EGD);  Surgeon: Corbin Ade, MD;  Location: [facility];  Service: Endoscopy;  Laterality: N/A;   EYE SURGERY     HOT HEMOSTASIS  06/02/2022   Procedure: HOT HEMOSTASIS (ARGON PLASMA COAGULATION/BICAP);  Surgeon: Corbin Ade, MD;  Location: [facility];  Service: Endoscopy;;    SOCIAL HISTORY:  Social History   Socioeconomic History   Marital status: Divorced    Spouse name: Not on file   Number of children: 1   Years of education: Not on file   Highest education level: Not on file  Occupational History   Not on file  Tobacco Use   Smoking status: Former    Packs/day: 1.00    Years: 25.00    Additional pack years: 0.00    Total pack years: 25.00    Types: Cigarettes    Quit date: 09/14/2007    Years since quitting: 15.6   Smokeless tobacco: Never  Vaping Use   Vaping Use: Never used  Substance and Sexual Activity   Alcohol use: Not Currently    Comment: occasional/rare (Brandy every now and then)   Drug use: No   Sexual activity: Not Currently    Birth control/protection: Post-menopausal, Surgical  Other Topics Concern   Not on file  Social History Narrative   Not on file   Social Determinants of Health   Financial Resource Strain: Not on file  Food Insecurity: Not on file  Transportation Needs: Not on file  Physical Activity: Not on file  Stress: Not on file  Social Connections: Not on file  Intimate Partner Violence: Not on file    FAMILY HISTORY:  Family History  Problem Relation Age of Onset   Heart failure Mother    Diabetes  Sister    Heart attack Father    Colon cancer Neg Hx  Gastric cancer Neg Hx    Esophageal cancer Neg Hx     CURRENT MEDICATIONS:  Outpatient Encounter Medications as of 05/10/2023  Medication Sig   acetaminophen (TYLENOL) 500 MG tablet Take 1,000 mg by mouth every 6 (six) hours as needed for moderate pain.   Acetaminophen 325 MG CAPS Take by mouth.   amitriptyline (ELAVIL) 25 MG tablet Take 25 mg by mouth at bedtime.   aspirin EC 325 MG tablet Take 325 mg by mouth daily.   atorvastatin (LIPITOR) 40 MG tablet Take 40 mg by mouth daily.   Cyanocobalamin (VITAMIN B-12 CR) 1000 MCG TBCR Take 1 tablet (1,000 mcg total) by mouth daily.   escitalopram (LEXAPRO) 10 MG tablet Take 10 mg by mouth daily.   ezetimibe (ZETIA) 10 MG tablet Take 10 mg by mouth daily.   mirtazapine (REMERON) 15 MG tablet Take 15 mg by mouth at bedtime.   omega-3 fish oil (MAXEPA) 1000 MG CAPS capsule Take 1 capsule by mouth daily.   ondansetron (ZOFRAN-ODT) 4 MG disintegrating tablet Take 1 tablet (4 mg total) by mouth every 8 (eight) hours as needed.   pantoprazole (PROTONIX) 40 MG tablet Take 40 mg by mouth 2 (two) times daily.   potassium chloride SA (KLOR-CON M) 20 MEQ tablet Take 20 mEq by mouth daily.   No facility-administered encounter medications on file as of 05/10/2023.    ALLERGIES:  Allergies  Allergen Reactions   Latex Hives, Itching and Rash   Sulfa Antibiotics Hives and Itching    Other reaction(s): hives   Other Hives   Pravastatin Sodium Other (See Comments)    LABORATORY DATA:  I have reviewed the labs as listed.  CBC    Component Value Date/Time   WBC 5.1 05/05/2023 1210   RBC 3.59 (L) 05/05/2023 1210   HGB 11.3 (L) 05/05/2023 1210   HCT 35.4 (L) 05/05/2023 1210   PLT 275 05/05/2023 1210   MCV 98.6 05/05/2023 1210   MCH 31.5 05/05/2023 1210   MCHC 31.9 05/05/2023 1210   RDW 13.5 05/05/2023 1210   LYMPHSABS 0.9 05/05/2023 1210   MONOABS 0.5 05/05/2023 1210   EOSABS 0.1  05/05/2023 1210   BASOSABS 0.0 05/05/2023 1210      Latest Ref Rng & Units 05/05/2023   12:10 PM 01/23/2023   12:07 PM 12/29/2022   12:14 PM  CMP  Glucose 70 - 99 mg/dL 621  308  83   BUN 8 - 23 mg/dL 9  13  10    Creatinine 0.44 - 1.00 mg/dL 6.57  8.46  9.62   Sodium 135 - 145 mmol/L 139  143  140   Potassium 3.5 - 5.1 mmol/L 3.9  3.6  4.1   Chloride 98 - 111 mmol/L 106  109  105   CO2 22 - 32 mmol/L 25  25  27    Calcium 8.9 - 10.3 mg/dL 9.2  8.9  9.4   Total Protein 6.5 - 8.1 g/dL 7.4  7.0  7.2   Total Bilirubin 0.3 - 1.2 mg/dL 0.5  0.4  0.4   Alkaline Phos 38 - 126 U/L 66  61  65   AST 15 - 41 U/L 19  18  19    ALT 0 - 44 U/L 14  12  13      DIAGNOSTIC IMAGING:  I have independently reviewed the relevant imaging and discussed with the patient.   WRAP UP:  All questions were answered. The patient knows to call the clinic with  any problems, questions or concerns.  Medical decision making: Moderate  Time spent on visit: I spent 20 minutes counseling the patient face to face. The total time spent in the appointment was 30 minutes and more than 50% was on counseling.  Carnella Guadalajara, PA-C  05/10/2023 1:23 PM

## 2023-05-10 ENCOUNTER — Inpatient Hospital Stay (HOSPITAL_BASED_OUTPATIENT_CLINIC_OR_DEPARTMENT_OTHER): Payer: 59 | Admitting: Physician Assistant

## 2023-05-10 VITALS — HR 64 | Temp 98.8°F | Resp 18 | Ht 64.0 in | Wt 131.7 lb

## 2023-05-10 DIAGNOSIS — Z90710 Acquired absence of both cervix and uterus: Secondary | ICD-10-CM

## 2023-05-10 DIAGNOSIS — Z87891 Personal history of nicotine dependence: Secondary | ICD-10-CM

## 2023-05-10 DIAGNOSIS — D500 Iron deficiency anemia secondary to blood loss (chronic): Secondary | ICD-10-CM | POA: Diagnosis not present

## 2023-05-10 DIAGNOSIS — T452X1A Poisoning by vitamins, accidental (unintentional), initial encounter: Secondary | ICD-10-CM

## 2023-05-10 DIAGNOSIS — E559 Vitamin D deficiency, unspecified: Secondary | ICD-10-CM

## 2023-05-10 DIAGNOSIS — D508 Other iron deficiency anemias: Secondary | ICD-10-CM | POA: Diagnosis not present

## 2023-05-10 DIAGNOSIS — E538 Deficiency of other specified B group vitamins: Secondary | ICD-10-CM

## 2023-05-10 DIAGNOSIS — R634 Abnormal weight loss: Secondary | ICD-10-CM | POA: Diagnosis not present

## 2023-05-10 MED ORDER — VITAMIN B-12 ER 1000 MCG PO TBCR
1.0000 | EXTENDED_RELEASE_TABLET | Freq: Every day | ORAL | 3 refills | Status: DC
Start: 2023-05-10 — End: 2023-11-29

## 2023-05-10 NOTE — Patient Instructions (Signed)
[facility] Discharge Instructions  You were seen today by Rojelio Brenner PA-C for your iron deficiency anemia.  Your blood and iron levels are low, so we will schedule you for IV IRON x 1 dose.  You should also continue to follow-up with your gastroenterologist [facility] doctor) for ongoing monitoring and management of your intestinal blood loss.     You should CONTINUE taking over-the-counter vitamin B12 supplement 1000 mcg daily  You should CONTINUE drinking Ensure (or Boost) 1-2 drinks daily due to your decreased appetite and weight loss.    I will see you for follow-up visit in 3 months.  ** Thank you for trusting me with your healthcare!  I strive to provide all of my patients with quality care at each visit.  If you receive a survey for this visit, I would be so grateful to you for taking the time to provide feedback.  Thank you in advance!  ~ Gitel Beste                   Dr. Doreatha Massed   &   Rojelio Brenner, PA-C   - - - - - - - - - - - - - - - - - -     Thank you for choosing [facility] to provide your oncology and hematology care.  To afford each patient quality time with our provider, please arrive at least 15 minutes before your scheduled appointment time.   If you have a lab appointment with the Cancer Center please come in thru the Main Entrance and check in at the main information desk.  You need to re-schedule your appointment should you arrive 10 or more minutes late.  We strive to give you quality time with our providers, and arriving late affects you and other patients whose appointments are after yours.  Also, if you no show three or more times for appointments you may be dismissed from the clinic at the providers discretion.     Again, thank you for choosing [facility].  Our hope is that these requests will decrease the amount of time that you wait before being seen by  our physicians.       _____________________________________________________________  Should you have questions after your visit to [facility], please contact our office at [phone] and follow the prompts.  Our office hours are 8:00 a.m. and 4:30 p.m. Monday - Friday.  Please note that voicemails left after 4:00 p.m. may not be returned until the following business day.  We are closed weekends and major holidays.  You do have access to a nurse 24-7, just call the main number to the clinic [phone] and do not press any options, hold on the line and a nurse will answer the phone.    For prescription refill requests, have your pharmacy contact our office and allow 72 hours.    Due to Covid, you will need to wear a mask upon entering the hospital. If you do not have a mask, a mask will be given to you at the Main Entrance upon arrival. For doctor visits, patients may have 1 support person age 26 or older with them. For treatment visits, patients can not have anyone with them due to social distancing guidelines and our immunocompromised population.

## 2023-05-23 ENCOUNTER — Inpatient Hospital Stay: Payer: 59

## 2023-05-25 ENCOUNTER — Inpatient Hospital Stay: Payer: 59 | Attending: Hematology

## 2023-05-25 VITALS — BP 113/67 | HR 61 | Temp 98.4°F | Resp 18

## 2023-05-25 DIAGNOSIS — D508 Other iron deficiency anemias: Secondary | ICD-10-CM | POA: Insufficient documentation

## 2023-05-25 DIAGNOSIS — D500 Iron deficiency anemia secondary to blood loss (chronic): Secondary | ICD-10-CM

## 2023-05-25 MED ORDER — SODIUM CHLORIDE 0.9 % IV SOLN: Freq: Once | INTRAVENOUS | Status: AC

## 2023-05-25 MED ORDER — CETIRIZINE HCL 10 MG PO TABS
10.0000 mg | ORAL_TABLET | Freq: Once | ORAL | Status: AC
  Administered 2023-05-25: 10 mg via ORAL
  Filled 2023-05-25: qty 1

## 2023-05-25 MED ORDER — SODIUM CHLORIDE 0.9 % IV SOLN
510.0000 mg | Freq: Once | INTRAVENOUS | Status: AC
  Administered 2023-05-25: 510 mg via INTRAVENOUS
  Filled 2023-05-25: qty 510

## 2023-05-25 NOTE — Patient Instructions (Signed)
[facility]  Discharge Instructions: Thank you for choosing [facility] to provide your oncology and hematology care.  If you have a lab appointment with the Cancer Center - please note that after April 8th, 2024, all labs will be drawn in the cancer center.  You do not have to check in or register with the main entrance as you have in the past but will complete your check-in in the cancer center.  Wear comfortable clothing and clothing appropriate for easy access to any Portacath or PICC line.   We strive to give you quality time with your provider. You may need to reschedule your appointment if you arrive late (15 or more minutes).  Arriving late affects you and other patients whose appointments are after yours.  Also, if you miss three or more appointments without notifying the office, you may be dismissed from the clinic at the provider's discretion.      For prescription refill requests, have your pharmacy contact our office and allow 72 hours for refills to be completed.    Today you received the following [name].  Ferumoxytol Injection What is this medication? FERUMOXYTOL (FER ue MOX i tol) treats low levels of iron in your body (iron deficiency anemia). Iron is a mineral that plays an important role in making red blood cells, which carry oxygen from your lungs to the rest of your body. This medicine may be used for other purposes; ask your health care provider or pharmacist if you have questions. COMMON BRAND NAME(S): [name] What should I tell my care team before I take this medication? They need to know if you have any of these conditions: Anemia not caused by low iron levels High levels of iron in the blood Magnetic resonance imaging (MRI) test scheduled An unusual or allergic reaction to iron, other medications, foods, dyes, or preservatives Pregnant or trying to get pregnant Breastfeeding How should I use this medication? This medication  is injected into a vein. It is given by your care team in a hospital or clinic setting. Talk to your care team the use of this medication in children. Special care may be needed. Overdosage: If you think you have taken too much of this medicine contact a poison control center or emergency room at once. NOTE: This medicine is only for you. Do not share this medicine with others. What if I miss a dose? It is important not to miss your dose. Call your care team if you are unable to keep an appointment. What may interact with this medication? Other iron products This list may not describe all possible interactions. Give your health care provider a list of all the medicines, herbs, non-prescription drugs, or dietary supplements you use. Also tell them if you smoke, drink alcohol, or use illegal drugs. Some items may interact with your medicine. What should I watch for while using this medication? Visit your care team regularly. Tell your care team if your symptoms do not start to get better or if they get worse. You may need blood work done while you are taking this medication. You may need to follow a special diet. Talk to your care team. Foods that contain iron include: whole grains/cereals, dried fruits, beans, or peas, leafy green vegetables, and organ meats (liver, kidney). What side effects may I notice from receiving this medication? Side effects that you should report to your care team as soon as possible: Allergic reactions--skin rash, itching, hives, swelling of the face,   lips, tongue, or throat Low blood pressure--dizziness, feeling faint or lightheaded, blurry vision Shortness of breath Side effects that usually do not require medical attention (report to your care team if they continue or are bothersome): Flushing Headache Joint pain Muscle pain Nausea Pain, redness, or irritation at injection site This list may not describe all possible side effects. Call your doctor for medical  advice about side effects. You may report side effects to FDA at 1-800-FDA-1088. Where should I keep my medication? This medication is given in a hospital or clinic and will not be stored at home. NOTE: This sheet is a summary. It may not cover all possible information. If you have questions about this medicine, talk to your doctor, pharmacist, or health care provider.  2024 Elsevier/Gold Standard (2022-05-09 00:00:00)    To help prevent nausea and vomiting after your treatment, we encourage you to take your nausea medication as directed.  BELOW ARE SYMPTOMS THAT SHOULD BE REPORTED IMMEDIATELY: *FEVER GREATER THAN 100.4 F (38 C) OR HIGHER *CHILLS OR SWEATING *NAUSEA AND VOMITING THAT IS NOT CONTROLLED WITH YOUR NAUSEA MEDICATION *UNUSUAL SHORTNESS OF BREATH *UNUSUAL BRUISING OR BLEEDING *URINARY PROBLEMS (pain or burning when urinating, or frequent urination) *BOWEL PROBLEMS (unusual diarrhea, constipation, pain near the anus) TENDERNESS IN MOUTH AND THROAT WITH OR WITHOUT PRESENCE OF ULCERS (sore throat, sores in mouth, or a toothache) UNUSUAL RASH, SWELLING OR PAIN  UNUSUAL VAGINAL DISCHARGE OR ITCHING   Items with * indicate a potential emergency and should be followed up as soon as possible or go to the Emergency Department if any problems should occur.  Please show the CHEMOTHERAPY ALERT CARD or IMMUNOTHERAPY ALERT CARD at check-in to the Emergency Department and triage nurse.  Should you have questions after your visit or need to cancel or reschedule your appointment, please contact [facility] [phone]  and follow the prompts.  Office hours are 8:00 a.m. to 4:30 p.m. Monday - Friday. Please note that voicemails left after 4:00 p.m. may not be returned until the following business day.  We are closed weekends and major holidays. You have access to a nurse at all times for urgent questions. Please call the main number to the clinic [phone] and follow the  prompts.  For any non-urgent questions, you may also contact your provider using MyChart. We now offer e-Visits for anyone 18 and older to request care online for non-urgent symptoms. For details visit [email or web address].   Also download the MyChart app! Go to the app store, search "MyChart", open the app, select [facility], and log in with your MyChart username and password.   

## 2023-05-25 NOTE — Progress Notes (Signed)
Patient presents today for iron infusion.  Patient is in satisfactory condition with no new complaints voiced.  Vital signs are stable.  We will proceed with infusion per provider orders.   Patient tolerated treatment well with no complaints voiced.  Patient left ambulatory in stable condition.  Vital signs stable at discharge.  Follow up as scheduled.    

## 2023-05-26 ENCOUNTER — Inpatient Hospital Stay: Payer: 59 | Attending: Hematology

## 2023-08-09 ENCOUNTER — Inpatient Hospital Stay: Payer: 59 | Attending: Hematology

## 2023-08-09 DIAGNOSIS — D500 Iron deficiency anemia secondary to blood loss (chronic): Secondary | ICD-10-CM

## 2023-08-09 DIAGNOSIS — E559 Vitamin D deficiency, unspecified: Secondary | ICD-10-CM | POA: Diagnosis not present

## 2023-08-09 DIAGNOSIS — D508 Other iron deficiency anemias: Secondary | ICD-10-CM | POA: Diagnosis present

## 2023-08-09 LAB — CBC WITH DIFFERENTIAL/PLATELET
Abs Immature Granulocytes: 0.01 10*3/uL (ref 0.00–0.07)
Basophils Absolute: 0 10*3/uL (ref 0.0–0.1)
Basophils Relative: 0 %
Eosinophils Absolute: 0.6 10*3/uL — ABNORMAL HIGH (ref 0.0–0.5)
Eosinophils Relative: 12 %
HCT: 36 % (ref 36.0–46.0)
Hemoglobin: 11.3 g/dL — ABNORMAL LOW (ref 12.0–15.0)
Immature Granulocytes: 0 %
Lymphocytes Relative: 13 %
Lymphs Abs: 0.7 10*3/uL (ref 0.7–4.0)
MCH: 30.4 pg (ref 26.0–34.0)
MCHC: 31.4 g/dL (ref 30.0–36.0)
MCV: 96.8 fL (ref 80.0–100.0)
Monocytes Absolute: 0.4 10*3/uL (ref 0.1–1.0)
Monocytes Relative: 8 %
Neutro Abs: 3.5 10*3/uL (ref 1.7–7.7)
Neutrophils Relative %: 67 %
Platelets: 263 10*3/uL (ref 150–400)
RBC: 3.72 MIL/uL — ABNORMAL LOW (ref 3.87–5.11)
RDW: 14.2 % (ref 11.5–15.5)
WBC: 5.2 10*3/uL (ref 4.0–10.5)
nRBC: 0 % (ref 0.0–0.2)

## 2023-08-09 LAB — COMPREHENSIVE METABOLIC PANEL
ALT: 12 U/L (ref 0–44)
AST: 17 U/L (ref 15–41)
Albumin: 3.5 g/dL (ref 3.5–5.0)
Alkaline Phosphatase: 55 U/L (ref 38–126)
Anion gap: 7 (ref 5–15)
BUN: 9 mg/dL (ref 8–23)
CO2: 27 mmol/L (ref 22–32)
Calcium: 8.9 mg/dL (ref 8.9–10.3)
Chloride: 107 mmol/L (ref 98–111)
Creatinine, Ser: 1 mg/dL (ref 0.44–1.00)
GFR, Estimated: 56 mL/min — ABNORMAL LOW (ref >60)
Glucose, Bld: 104 mg/dL — ABNORMAL HIGH (ref 70–99)
Potassium: 3.5 mmol/L (ref 3.5–5.1)
Sodium: 141 mmol/L (ref 135–145)
Total Bilirubin: 0.4 mg/dL (ref 0.3–1.2)
Total Protein: 6.2 g/dL — ABNORMAL LOW (ref 6.5–8.1)

## 2023-08-09 LAB — IRON AND TIBC
Iron: 51 ug/dL (ref 28–170)
Saturation Ratios: 21 % (ref 10.4–31.8)
TIBC: 249 ug/dL — ABNORMAL LOW (ref 250–450)
UIBC: 198 ug/dL

## 2023-08-09 LAB — FERRITIN: Ferritin: 79 ng/mL (ref 11–307)

## 2023-08-12 NOTE — Progress Notes (Deleted)
[facility] [address], [place] [address]   CLINIC:  Medical Oncology/Hematology  PCP:  Smith Robert, MD [address] [place] [address] [phone]   REASON FOR VISIT:  Follow-up for iron deficiency anemia   CURRENT THERAPY: Intermittent IV iron infusions   INTERVAL HISTORY:   Melody Mitchell 82 y.o. female returns for routine follow-up of iron deficiency anemia.  She was last seen by Rojelio Brenner PA-C on 05/10/2023.  She received [name] x1  in July 2024.  At today's visit, she reports feeling ***.  She denies any recent hospitalizations, surgeries, or changes in her baseline health status  She has not noticed any recent black bowel movements or melanotic stool that she can recall.  *** ***She denies any other bleeding such as epistaxis, hematemesis, hematuria, or hematochezia. ***  She reports that her energy is fair. ***  She has intermittent headaches.  *** She denies any pica, chest pain, dyspnea on exertion, lightheadedness, or syncope.     She has 65***% energy and 70***% appetite.  She has had some mild ongoing weight loss*** that she attributes to poor appetite.  Her weight today is stable*** compared to her last appointment, has been relatively stable over the past 6 months.  ASSESSMENT & PLAN:  1.  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  She presented to ED on with acute on chronic weakness and fatigue, found to be hemoccult positive with Hgb 8.3 (dropped from 10.0 two weeks prior).  She received 1 unit PRBC.   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  Renal function normal  for age. - Last [name] x 1 in July 2024 - Most recent labs (08/09/2023): Hgb 11.3/MCV 96.8.  Ferritin 79, iron saturation 21%. - She does not recall the last time that she noticed any melanotic stool*** - She is symptomatic with fatigue     *** - She is followed by the GI providers at [facility]*** - PLAN: Recommend IV [name] x 1 - Repeat labs and RTC in 3 months   - Follow-up with GI as scheduled   2.  Vitamin D deficiency, with subsequent overload - Patient previously required vitamin D supplementation, but was mistakenly taking this too frequently and did not stop when instructed - Vitamin D peaked at 193.00 on 05/03/2022 - Most recent labs (05/05/2023) show vitamin D 41.51 - PLAN: No vitamin D supplement at this time. - We will restart her on the lower dose if appropriate in the future.  Recheck vitamin D level in about 3 months (around December 2024)    3.  Borderline vitamin B12 deficiency: - Labs on 05/05/2023 show vitamin B12 at 655 with normal methylmalonic acid - She is not sure if she is taking her B12 supplement or not, but it is listed on her medication list from her pharmacy - PLAN: Encouraged her to continue vitamin B12 supplement.  We will check B12 and methylmalonic acid in about 3 months (around December 2024)   4.  Poor appetite and weight loss    *** - Reports decreased appetite for the past several months -  She has noted to have increasing memory deficits, and there is some concern that she may be forgetting to eat - PLAN: Patient instructed to continue drinking 1-2 Ensure/Boost beverages daily. - We will check weight at follow-up appointment in 2 months.  PLAN SUMMARY:*** >> IV [name] x 1 >> Labs in 3 months = CBC/D, CMP, ferritin, iron/TIBC, vitamin D, B12, MMA >> OFFICE visit in 3 months (1 week after labs)    REVIEW OF SYSTEMS:***  Review of Systems  Constitutional:  Positive for appetite change and fatigue. Negative for  chills, diaphoresis, fever and unexpected weight change.  HENT:   Negative for lump/mass and nosebleeds.   Eyes:  Negative for eye problems.  Respiratory:  Negative for cough, hemoptysis and shortness of breath.   Cardiovascular:  Negative for chest pain, leg swelling and palpitations.  Gastrointestinal:  Positive for constipation. Negative for abdominal pain, blood in stool, diarrhea, nausea and vomiting.  Genitourinary:  Negative for hematuria.   Musculoskeletal:  Positive for arthralgias (knees).  Skin: Negative.   Neurological:  Positive for headaches. Negative for dizziness and light-headedness.  Hematological:  Does not bruise/bleed easily.  Psychiatric/Behavioral:  Positive for confusion and sleep disturbance. Negative for depression. The patient is not nervous/anxious.      PHYSICAL EXAM:  ECOG PERFORMANCE STATUS: 1 - Symptomatic but completely ambulatory *** There were no vitals filed for this visit. There were no vitals filed for this visit. Physical Exam Constitutional:      Appearance: Normal appearance. She is obese.  HENT:     Head: Normocephalic and atraumatic.     Mouth/Throat:     Mouth: Mucous membranes are moist.  Eyes:     Extraocular Movements: Extraocular movements intact.     Pupils: Pupils are equal, round, and reactive to light.  Cardiovascular:     Rate and Rhythm: Normal rate and regular rhythm.     Pulses: Normal pulses.     Heart sounds: Normal heart sounds.  Pulmonary:     Effort: Pulmonary effort is normal.     Breath sounds: Normal breath sounds.  Abdominal:     General: Bowel sounds are normal.     Palpations: Abdomen is soft.     Tenderness: There is no abdominal tenderness.  Musculoskeletal:        General: No swelling.     Right lower leg: No edema.     Left lower leg: No edema.  Lymphadenopathy:     Cervical: No cervical adenopathy.  Skin:    General: Skin is warm and dry.  Neurological:     General: No focal deficit present.      Mental Status: She is alert. Mental status is at baseline.  Psychiatric:        Mood and Affect: Mood normal.        Behavior: Behavior normal.        Cognition and Memory: Cognition is impaired. Memory is impaired.    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 four colon polyps removed. No source  for change in bowel habits identified 3. Rectal bleeding due to large internal hemorrhoids    COLONOSCOPY W/ BIOPSIES  6 2010   Dr. Darrick Penna: Polypoid appearing lesion of the ascending colon, 3 mm sessile rectal polyp, small internal hemorrhoids. Pathology revealed polypoid mucosa, no adenomatous changes   Double-balloon enteroscopy, antegrade  April 2011   Dr. 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   ENTEROSCOPY N/A 06/02/2022   Procedure: ENTEROSCOPY;  Surgeon: Corbin Ade, MD;  Location: [facility];  Service: Endoscopy;  Laterality: N/A;  with pediatric colonoscope   ESOPHAGOGASTRODUODENOSCOPY  04/2009   Dr. Darrick Penna: Patent Schatzki ring dilated with advancing the scope, patchy erythema in the antrum, 2 small AVMs in the duodenal bulb, 2 additional AVMs noted in the second portion the duodenum, mild gastritis on pathology   ESOPHAGOGASTRODUODENOSCOPY N/A 06/29/2015   SLF: 1. 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 small hiatal hernia  3. Mild non-erosive gastririts- NO obvious source for dyspepsia identified.    ESOPHAGOGASTRODUODENOSCOPY N/A 03/13/2020   Fields: Low-grade narrowing Schatzki ring, small hiatal hernia, multiple nonbleeding angiectasia's in the jejunum treated with APC.  Spot tattoo in jejunum at 90 cm from the incisors.   ESOPHAGOGASTRODUODENOSCOPY N/A 06/02/2022   Procedure: ESOPHAGOGASTRODUODENOSCOPY (EGD);  Surgeon: Corbin Ade, MD;  Location: [facility];  Service: Endoscopy;  Laterality: N/A;   EYE SURGERY     HOT HEMOSTASIS  06/02/2022   Procedure: HOT HEMOSTASIS (ARGON PLASMA COAGULATION/BICAP);  Surgeon: Corbin Ade, MD;  Location: [facility];  Service: Endoscopy;;    SOCIAL HISTORY:  Social History   Socioeconomic History   Marital status: Divorced    Spouse name: Not on file   Number of children: 1   Years of education: Not on file   Highest education level: Not on file  Occupational History   Not on file  Tobacco Use   Smoking status: Former    Current packs/day: 0.00    Average packs/day: 1 pack/day for 25.0 years (25.0 ttl pk-yrs)    Types: Cigarettes    Start date: 09/13/1982    Quit date: 09/14/2007    Years since quitting: 15.9   Smokeless tobacco: Never  Vaping Use   Vaping status: Never Used  Substance and Sexual Activity   Alcohol use: Not Currently    Comment: occasional/rare (Brandy every now and then)   Drug use: No   Sexual activity: Not Currently    Birth control/protection: Post-menopausal, Surgical  Other Topics Concern   Not on file  Social History Narrative   Not on file   Social Determinants of Health   Financial Resource Strain: Not on file  Food Insecurity: Not on file  Transportation Needs: Not on file  Physical Activity: Not on file  Stress: Not on file  Social Connections: Unknown (03/29/2022)   Received from [facility]   Social Network    Social Network: Not on file  Intimate Partner Violence: Unknown (02/18/2022)    Received from [facility], [facility]   HITS    Physically Hurt: Not on file    Insult or Talk Down To: Not on file  Threaten Physical Harm: Not on file    Scream or Curse: Not on file    FAMILY HISTORY:  Family History  Problem Relation Age of Onset   Heart failure Mother    Diabetes Sister    Heart attack Father    Colon cancer Neg Hx    Gastric cancer Neg Hx    Esophageal cancer Neg Hx     CURRENT MEDICATIONS:  Outpatient Encounter Medications as of 08/14/2023  Medication Sig   acetaminophen (TYLENOL) 500 MG tablet Take 1,000 mg by mouth every 6 (six) hours as needed for moderate pain.   Acetaminophen 325 MG CAPS Take by mouth.   amitriptyline (ELAVIL) 25 MG tablet Take 25 mg by mouth at bedtime.   aspirin EC 325 MG tablet Take 325 mg by mouth daily.   atorvastatin (LIPITOR) 40 MG tablet Take 40 mg by mouth daily.   Cyanocobalamin (VITAMIN B-12 CR) 1000 MCG TBCR Take 1 tablet (1,000 mcg total) by mouth daily.   escitalopram (LEXAPRO) 10 MG tablet Take 10 mg by mouth daily.   ezetimibe (ZETIA) 10 MG tablet Take 10 mg by mouth daily.   mirtazapine (REMERON) 15 MG tablet Take 15 mg by mouth at bedtime.   omega-3 fish oil (MAXEPA) 1000 MG CAPS capsule Take 1 capsule by mouth daily.   ondansetron (ZOFRAN-ODT) 4 MG disintegrating tablet Take 1 tablet (4 mg total) by mouth every 8 (eight) hours as needed.   pantoprazole (PROTONIX) 40 MG tablet Take 40 mg by mouth 2 (two) times daily.   potassium chloride SA (KLOR-CON M) 20 MEQ tablet Take 20 mEq by mouth daily.   No facility-administered encounter medications on file as of 08/14/2023.    ALLERGIES:  Allergies  Allergen Reactions   Latex Hives, Itching and Rash   Sulfa Antibiotics Hives and Itching    Other reaction(s): hives   Other Hives   Pravastatin Sodium Other (See Comments)    LABORATORY DATA:  I have reviewed the labs as listed.  CBC    Component Value Date/Time   WBC 5.2 08/09/2023 0832   RBC 3.72 (L)  08/09/2023 0832   HGB 11.3 (L) 08/09/2023 0832   HCT 36.0 08/09/2023 0832   PLT 263 08/09/2023 0832   MCV 96.8 08/09/2023 0832   MCH 30.4 08/09/2023 0832   MCHC 31.4 08/09/2023 0832   RDW 14.2 08/09/2023 0832   LYMPHSABS 0.7 08/09/2023 0832   MONOABS 0.4 08/09/2023 0832   EOSABS 0.6 (H) 08/09/2023 0832   BASOSABS 0.0 08/09/2023 0832      Latest Ref Rng & Units 08/09/2023    8:32 AM 05/05/2023   12:10 PM 01/23/2023   12:07 PM  CMP  Glucose 70 - 99 mg/dL 629  528  413   BUN 8 - 23 mg/dL 9  9  13    Creatinine 0.44 - 1.00 mg/dL 2.44  0.10  2.72   Sodium 135 - 145 mmol/L 141  139  143   Potassium 3.5 - 5.1 mmol/L 3.5  3.9  3.6   Chloride 98 - 111 mmol/L 107  106  109   CO2 22 - 32 mmol/L 27  25  25    Calcium 8.9 - 10.3 mg/dL 8.9  9.2  8.9   Total Protein 6.5 - 8.1 g/dL 6.2  7.4  7.0   Total Bilirubin 0.3 - 1.2 mg/dL 0.4  0.5  0.4   Alkaline Phos 38 - 126 U/L 55  66  61   AST 15 -  41 U/L 17  19  18    ALT 0 - 44 U/L 12  14  12      DIAGNOSTIC IMAGING:  I have independently reviewed the relevant imaging and discussed with the patient.   WRAP UP:  All questions were answered. The patient knows to call the clinic with any problems, questions or concerns.  Medical decision making: Moderate  Time spent on visit: I spent 20 minutes counseling the patient face to face. The total time spent in the appointment was 30 minutes and more than 50% was on counseling.  Carnella Guadalajara, PA-C  ***

## 2023-08-14 ENCOUNTER — Inpatient Hospital Stay: Payer: 59 | Admitting: Physician Assistant

## 2023-08-19 NOTE — Progress Notes (Unsigned)
[facility] [address], [place] [address]   CLINIC:  Medical Oncology/Hematology  PCP:  Smith Robert, MD [address] [place] [address] [phone]   REASON FOR VISIT:  Follow-up for iron deficiency anemia   CURRENT THERAPY: Intermittent IV iron infusions    INTERVAL HISTORY:   Melody Mitchell 82 y.o. female returns for routine follow-up of iron deficiency anemia.  She was last seen by Rojelio Brenner PA-C on 05/10/2023.  She received [name] x1  in July 2024.   At today's visit, she reports feeling fair.  She denies any recent hospitalizations, surgeries, or changes in her baseline health status.  She reports that she has been feeling some increasing fatigue for the past few weeks.  She continues to have intermittent headaches.  She denies any pica, chest pain, dyspnea on exertion, lightheadedness, or syncope.  She has not noticed any recent black bowel movements or melanotic stool that she can recall.  She denies any other bleeding such as epistaxis, hematemesis, hematuria, or hematochezia.  She has not been taking her vitamin B12 supplements lately.   She complains of poor appetite, and has not been drinking her Ensure lately.  She has lost 4 pounds in the past 4 months.  She reports 50% energy and 50% appetite.  ASSESSMENT & PLAN:  1.  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  She presented to ED with weakness and fatigue, found to be hemoccult positive with Hgb 8.3 (dropped from 10.0 two weeks prior).  She received 1 unit PRBC.   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  Renal function  normal for age. - Last [name] x 1 in July 2024 - Most recent labs (08/09/2023): Hgb 11.3/MCV 96.8.  Ferritin 79, iron saturation 21%. - She does not recall the last time that she noticed any melanotic stool - She is symptomatic with fatigue      - She is followed by the GI providers at [facility] - PLAN: Recommend IV [name] x 1 - Repeat labs and RTC in 3 months   - Follow-up with GI as scheduled   2.  Vitamin D deficiency, with subsequent overload - Patient previously required vitamin D supplementation, but was mistakenly taking this too frequently and did not stop when instructed - Vitamin D peaked at 193.00 on 05/03/2022 - Most recent labs (05/05/2023) show vitamin D 41.51 - PLAN: No vitamin D supplement at this time. - We will restart her on the lower dose if appropriate in the future.  Recheck vitamin D level in about 3 months (around January 2025)    3.  Borderline vitamin B12 deficiency: - Labs on 05/05/2023 show vitamin B12 at 655 with normal methylmalonic acid - She is not sure if she is taking her B12 supplement or not, but it is listed on her medication list from her pharmacy - PLAN: Encouraged her to restart her vitamin B12 supplement.  We will check B12 and methylmalonic acid in about 3 months (around December 2024)   4.  Poor appetite and weight loss     - Reports decreased appetite for  the past several months - She has noted to have increasing memory deficits, and there is some concern that she may be forgetting to eat - PLAN: Patient instructed to continue drinking 1-2 Ensure/Boost beverages daily. - We will check weight at follow-up appointment    PLAN SUMMARY: >> IV [name] x 1 >> Labs in 3 months = CBC/D, CMP, ferritin, iron/TIBC, vitamin D, B12, MMA >> OFFICE visit in 3 months (1 week after labs)     REVIEW OF SYSTEMS:   Review of Systems  Constitutional:  Positive for appetite change and fatigue. Negative for chills, diaphoresis,  fever and unexpected weight change.  HENT:   Negative for lump/mass and nosebleeds.   Eyes:  Negative for eye problems.  Respiratory:  Negative for cough, hemoptysis and shortness of breath.   Cardiovascular:  Negative for chest pain, leg swelling and palpitations.  Gastrointestinal:  Positive for constipation. Negative for abdominal pain, blood in stool, diarrhea, nausea and vomiting.  Genitourinary:  Negative for hematuria.   Skin: Negative.   Neurological:  Positive for headaches. Negative for dizziness and light-headedness.  Hematological:  Does not bruise/bleed easily.     PHYSICAL EXAM:  ECOG PERFORMANCE STATUS: 2 - Symptomatic, <50% confined to bed  Vitals:   08/21/23 0816  BP: 108/72  Pulse: (!) 56  Resp: 18  Temp: 99.2 F (37.3 C)  SpO2: 100%   Filed Weights   08/21/23 0816  Weight: 127 lb 13.9 oz (58 kg)   Physical Exam Constitutional:      Appearance: Normal appearance. She is normal weight.  Cardiovascular:     Rate and Rhythm: Bradycardia present.     Heart sounds: Normal heart sounds.     Comments: HR 56 Pulmonary:     Breath sounds: Normal breath sounds.  Neurological:     General: No focal deficit present.     Mental Status: Mental status is at baseline.  Psychiatric:        Behavior: Behavior normal. Behavior is cooperative.     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 four colon polyps removed. No source  for change in bowel habits identified 3. Rectal bleeding due to large internal hemorrhoids    COLONOSCOPY W/ BIOPSIES  6 2010   Dr. Darrick Penna: Polypoid appearing lesion of the ascending colon, 3 mm sessile rectal polyp, small internal hemorrhoids. Pathology revealed polypoid mucosa, no adenomatous changes   Double-balloon enteroscopy, antegrade  April 2011   Dr. 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   ENTEROSCOPY N/A 06/02/2022   Procedure: ENTEROSCOPY;  Surgeon: Corbin Ade, MD;  Location: [facility];  Service: Endoscopy;  Laterality: N/A;  with pediatric colonoscope   ESOPHAGOGASTRODUODENOSCOPY  04/2009  Dr. Darrick Penna: Patent Schatzki ring dilated with advancing the scope, patchy erythema in the antrum, 2 small AVMs in the duodenal bulb, 2 additional AVMs noted in the second portion the duodenum, mild gastritis on pathology   ESOPHAGOGASTRODUODENOSCOPY N/A 06/29/2015   SLF: 1. 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 small hiatal hernia 3. Mild non-erosive gastririts- NO obvious source for dyspepsia identified.    ESOPHAGOGASTRODUODENOSCOPY N/A 03/13/2020   Fields: Low-grade narrowing Schatzki ring, small hiatal hernia, multiple nonbleeding angiectasia's in the jejunum treated with APC.  Spot tattoo in jejunum at 90 cm from the incisors.   ESOPHAGOGASTRODUODENOSCOPY N/A 06/02/2022   Procedure: ESOPHAGOGASTRODUODENOSCOPY (EGD);  Surgeon: Corbin Ade, MD;  Location: [facility];  Service: Endoscopy;  Laterality: N/A;   EYE SURGERY     HOT HEMOSTASIS  06/02/2022   Procedure: HOT HEMOSTASIS (ARGON PLASMA COAGULATION/BICAP);  Surgeon: Corbin Ade, MD;  Location: [facility];  Service: Endoscopy;;    SOCIAL HISTORY:  Social History   Socioeconomic History   Marital status: Divorced    Spouse name: Not on file   Number of children: 1   Years of education: Not on  file   Highest education level: Not on file  Occupational History   Not on file  Tobacco Use   Smoking status: Former    Current packs/day: 0.00    Average packs/day: 1 pack/day for 25.0 years (25.0 ttl pk-yrs)    Types: Cigarettes    Start date: 09/13/1982    Quit date: 09/14/2007    Years since quitting: 15.9   Smokeless tobacco: Never  Vaping Use   Vaping status: Never Used  Substance and Sexual Activity   Alcohol use: Not Currently    Comment: occasional/rare (Brandy every now and then)   Drug use: No   Sexual activity: Not Currently    Birth control/protection: Post-menopausal, Surgical  Other Topics Concern   Not on file  Social History Narrative   Not on file   Social Determinants of Health   Financial Resource Strain: Not on file  Food Insecurity: Not on file  Transportation Needs: Not on file  Physical Activity: Not on file  Stress: Not on file  Social Connections: Unknown (03/29/2022)   [facility]   Social Network    Social Network: Not on file  Intimate Partner Violence: Unknown (02/18/2022)   Received from [facility], [facility]   HITS    Physically Hurt: Not on file    Insult or Talk Down To: Not on file    Threaten Physical Harm: Not on file    Scream or Curse: Not on file    FAMILY HISTORY:  Family History  Problem Relation Age of Onset   Heart failure Mother    Diabetes Sister    Heart attack Father    Colon cancer Neg Hx    Gastric cancer Neg Hx    Esophageal cancer Neg Hx     CURRENT MEDICATIONS:  Outpatient Encounter Medications as of 08/21/2023  Medication Sig   acetaminophen (TYLENOL) 500 MG tablet Take 1,000 mg by mouth every 6 (six) hours as needed for moderate pain.   Acetaminophen 325 MG CAPS Take by mouth.   amitriptyline (ELAVIL) 25 MG tablet Take 25 mg by mouth at bedtime.   aspirin EC 325 MG tablet Take 325 mg by mouth daily.   atorvastatin (LIPITOR) 40 MG tablet Take 40 mg by mouth daily.    Cyanocobalamin (VITAMIN B-12 CR)  1000 MCG TBCR Take 1 tablet (1,000 mcg total) by mouth daily.   escitalopram (LEXAPRO) 10 MG tablet Take 10 mg by mouth daily.   ezetimibe (ZETIA) 10 MG tablet Take 10 mg by mouth daily.   mirtazapine (REMERON) 15 MG tablet Take 15 mg by mouth at bedtime.   omega-3 fish oil (MAXEPA) 1000 MG CAPS capsule Take 1 capsule by mouth daily.   ondansetron (ZOFRAN-ODT) 4 MG disintegrating tablet Take 1 tablet (4 mg total) by mouth every 8 (eight) hours as needed.   pantoprazole (PROTONIX) 40 MG tablet Take 40 mg by mouth 2 (two) times daily.   potassium chloride SA (KLOR-CON M) 20 MEQ tablet Take 20 mEq by mouth daily.   No facility-administered encounter medications on file as of 08/21/2023.    ALLERGIES:  Allergies  Allergen Reactions   Latex Hives, Itching and Rash   Sulfa Antibiotics Hives and Itching    Other reaction(s): hives   Other Hives   Pravastatin Sodium Other (See Comments)    LABORATORY DATA:  I have reviewed the labs as listed.  CBC    Component Value Date/Time   WBC 5.2 08/09/2023 0832   RBC 3.72 (L) 08/09/2023 0832   HGB 11.3 (L) 08/09/2023 0832   HCT 36.0 08/09/2023 0832   PLT 263 08/09/2023 0832   MCV 96.8 08/09/2023 0832   MCH 30.4 08/09/2023 0832   MCHC 31.4 08/09/2023 0832   RDW 14.2 08/09/2023 0832   LYMPHSABS 0.7 08/09/2023 0832   MONOABS 0.4 08/09/2023 0832   EOSABS 0.6 (H) 08/09/2023 0832   BASOSABS 0.0 08/09/2023 0832      Latest Ref Rng & Units 08/09/2023    8:32 AM 05/05/2023   12:10 PM 01/23/2023   12:07 PM  CMP  Glucose 70 - 99 mg/dL 161  096  045   BUN 8 - 23 mg/dL 9  9  13    Creatinine 0.44 - 1.00 mg/dL 4.09  8.11  9.14   Sodium 135 - 145 mmol/L 141  139  143   Potassium 3.5 - 5.1 mmol/L 3.5  3.9  3.6   Chloride 98 - 111 mmol/L 107  106  109   CO2 22 - 32 mmol/L 27  25  25    Calcium 8.9 - 10.3 mg/dL 8.9  9.2  8.9   Total Protein 6.5 - 8.1 g/dL 6.2  7.4  7.0   Total Bilirubin 0.3 - 1.2 mg/dL 0.4  0.5  0.4    Alkaline Phos 38 - 126 U/L 55  66  61   AST 15 - 41 U/L 17  19  18    ALT 0 - 44 U/L 12  14  12      DIAGNOSTIC IMAGING:  I have independently reviewed the relevant imaging and discussed with the patient.   WRAP UP:  All questions were answered. The patient knows to call the clinic with any problems, questions or concerns.  Medical decision making: Moderate  Time spent on visit: I spent 20 minutes counseling the patient face to face. The total time spent in the appointment was 30 minutes and more than 50% was on counseling.  Melody Guadalajara, PA-C  08/21/23 9:00 AM

## 2023-08-21 ENCOUNTER — Inpatient Hospital Stay: Payer: 59 | Attending: Hematology | Admitting: Physician Assistant

## 2023-08-21 VITALS — BP 108/72 | HR 56 | Temp 99.2°F | Resp 18 | Wt 127.9 lb

## 2023-08-21 DIAGNOSIS — T452X1A Poisoning by vitamins, accidental (unintentional), initial encounter: Secondary | ICD-10-CM

## 2023-08-21 DIAGNOSIS — D500 Iron deficiency anemia secondary to blood loss (chronic): Secondary | ICD-10-CM | POA: Diagnosis not present

## 2023-08-21 DIAGNOSIS — R634 Abnormal weight loss: Secondary | ICD-10-CM | POA: Insufficient documentation

## 2023-08-21 DIAGNOSIS — Z87891 Personal history of nicotine dependence: Secondary | ICD-10-CM | POA: Diagnosis not present

## 2023-08-21 DIAGNOSIS — K909 Intestinal malabsorption, unspecified: Secondary | ICD-10-CM | POA: Insufficient documentation

## 2023-08-21 DIAGNOSIS — D508 Other iron deficiency anemias: Secondary | ICD-10-CM | POA: Diagnosis present

## 2023-08-21 DIAGNOSIS — E559 Vitamin D deficiency, unspecified: Secondary | ICD-10-CM | POA: Insufficient documentation

## 2023-08-21 DIAGNOSIS — Q2730 Arteriovenous malformation, site unspecified: Secondary | ICD-10-CM | POA: Diagnosis not present

## 2023-08-21 DIAGNOSIS — E538 Deficiency of other specified B group vitamins: Secondary | ICD-10-CM

## 2023-08-21 DIAGNOSIS — R630 Anorexia: Secondary | ICD-10-CM | POA: Insufficient documentation

## 2023-08-21 NOTE — Patient Instructions (Signed)
[facility] Discharge Instructions  You were seen today by Rojelio Brenner PA-C for your iron deficiency anemia.  Your blood and iron levels are low, so we will schedule you for IV IRON x 1 dose.  You should also continue to follow-up with your gastroenterologist [facility] doctor) for ongoing monitoring and management of your intestinal blood loss.     You should CONTINUE taking over-the-counter vitamin B12 supplement 1000 mcg daily  You should CONTINUE drinking Ensure (or Boost) 1-2 drinks daily due to your decreased appetite and weight loss.    I will see you for follow-up visit in 3 months.  ** Thank you for trusting me with your healthcare!  I strive to provide all of my patients with quality care at each visit.  If you receive a survey for this visit, I would be so grateful to you for taking the time to provide feedback.  Thank you in advance!  ~ Gitel Beste                   Dr. Doreatha Massed   &   Rojelio Brenner, PA-C   - - - - - - - - - - - - - - - - - -     Thank you for choosing [facility] to provide your oncology and hematology care.  To afford each patient quality time with our provider, please arrive at least 15 minutes before your scheduled appointment time.   If you have a lab appointment with the Cancer Center please come in thru the Main Entrance and check in at the main information desk.  You need to re-schedule your appointment should you arrive 10 or more minutes late.  We strive to give you quality time with our providers, and arriving late affects you and other patients whose appointments are after yours.  Also, if you no show three or more times for appointments you may be dismissed from the clinic at the providers discretion.     Again, thank you for choosing [facility].  Our hope is that these requests will decrease the amount of time that you wait before being seen by  our physicians.       _____________________________________________________________  Should you have questions after your visit to [facility], please contact our office at [phone] and follow the prompts.  Our office hours are 8:00 a.m. and 4:30 p.m. Monday - Friday.  Please note that voicemails left after 4:00 p.m. may not be returned until the following business day.  We are closed weekends and major holidays.  You do have access to a nurse 24-7, just call the main number to the clinic [phone] and do not press any options, hold on the line and a nurse will answer the phone.    For prescription refill requests, have your pharmacy contact our office and allow 72 hours.    Due to Covid, you will need to wear a mask upon entering the hospital. If you do not have a mask, a mask will be given to you at the Main Entrance upon arrival. For doctor visits, patients may have 1 support person age 26 or older with them. For treatment visits, patients can not have anyone with them due to social distancing guidelines and our immunocompromised population.

## 2023-08-28 ENCOUNTER — Inpatient Hospital Stay: Payer: 59

## 2023-08-28 VITALS — BP 108/69 | HR 54 | Temp 98.0°F | Resp 18

## 2023-08-28 DIAGNOSIS — D500 Iron deficiency anemia secondary to blood loss (chronic): Secondary | ICD-10-CM

## 2023-08-28 DIAGNOSIS — D508 Other iron deficiency anemias: Secondary | ICD-10-CM | POA: Diagnosis not present

## 2023-08-28 MED ORDER — SODIUM CHLORIDE 0.9 % IV SOLN: Freq: Once | INTRAVENOUS | Status: AC

## 2023-08-28 MED ORDER — SODIUM CHLORIDE 0.9 % IV SOLN
510.0000 mg | Freq: Once | INTRAVENOUS | Status: AC
  Administered 2023-08-28: 510 mg via INTRAVENOUS
  Filled 2023-08-28: qty 510

## 2023-08-28 MED ORDER — CETIRIZINE HCL 10 MG PO TABS
10.0000 mg | ORAL_TABLET | Freq: Once | ORAL | Status: AC
  Administered 2023-08-28: 10 mg via ORAL
  Filled 2023-08-28: qty 1

## 2023-08-28 NOTE — Progress Notes (Signed)
[name] given today per MD orders. Tolerated infusion without adverse affects. Vital signs stable. No complaints at this time. Discharged from clinic ambulatory in stable condition. Alert and oriented x 3. Follow ups as scheduled  Melody Mitchell [employer]

## 2023-08-28 NOTE — Patient Instructions (Signed)
 Ferumoxytol Injection What is this medication? FERUMOXYTOL (FER ue MOX i tol) treats low levels of iron in your body (iron deficiency anemia). Iron is a mineral that plays an important role in making red blood cells, which carry oxygen from your lungs to the rest of your body. This medicine may be used for other purposes; ask your health care provider or pharmacist if you have questions. COMMON BRAND NAME(S): [name] What should I tell my care team before I take this medication? They need to know if you have any of these conditions: Anemia not caused by low iron levels High levels of iron in the blood Magnetic resonance imaging (MRI) test scheduled An unusual or allergic reaction to iron, other medications, foods, dyes, or preservatives Pregnant or trying to get pregnant Breastfeeding How should I use this medication? This medication is injected into a vein. It is given by your care team in a hospital or clinic setting. Talk to your care team the use of this medication in children. Special care may be needed. Overdosage: If you think you have taken too much of this medicine contact a poison control center or emergency room at once. NOTE: This medicine is only for you. Do not share this medicine with others. What if I miss a dose? It is important not to miss your dose. Call your care team if you are unable to keep an appointment. What may interact with this medication? Other iron products This list may not describe all possible interactions. Give your health care provider a list of all the medicines, herbs, non-prescription drugs, or dietary supplements you use. Also tell them if you smoke, drink alcohol, or use illegal drugs. Some items may interact with your medicine. What should I watch for while using this medication? Visit your care team regularly. Tell your care team if your symptoms do not start to get better or if they get worse. You may need blood work done while you are taking this  medication. You may need to follow a special diet. Talk to your care team. Foods that contain iron include: whole grains/cereals, dried fruits, beans, or peas, leafy green vegetables, and organ meats (liver, kidney). What side effects may I notice from receiving this medication? Side effects that you should report to your care team as soon as possible: Allergic reactions--skin rash, itching, hives, swelling of the face, lips, tongue, or throat Low blood pressure--dizziness, feeling faint or lightheaded, blurry vision Shortness of breath Side effects that usually do not require medical attention (report to your care team if they continue or are bothersome): Flushing Headache Joint pain Muscle pain Nausea Pain, redness, or irritation at injection site This list may not describe all possible side effects. Call your doctor for medical advice about side effects. You may report side effects to FDA at 1-800-FDA-1088. Where should I keep my medication? This medication is given in a hospital or clinic. It will not be stored at home. NOTE: This sheet is a summary. It may not cover all possible information. If you have questions about this medicine, talk to your doctor, pharmacist, or health care provider.  2024 Elsevier/Gold Standard (2023-04-07 00:00:00)

## 2023-11-22 ENCOUNTER — Inpatient Hospital Stay: Payer: 59

## 2023-11-23 ENCOUNTER — Inpatient Hospital Stay: Payer: 59 | Attending: Hematology

## 2023-11-23 DIAGNOSIS — R634 Abnormal weight loss: Secondary | ICD-10-CM | POA: Insufficient documentation

## 2023-11-23 DIAGNOSIS — K5520 Angiodysplasia of colon without hemorrhage: Secondary | ICD-10-CM | POA: Diagnosis not present

## 2023-11-23 DIAGNOSIS — D500 Iron deficiency anemia secondary to blood loss (chronic): Secondary | ICD-10-CM

## 2023-11-23 DIAGNOSIS — E538 Deficiency of other specified B group vitamins: Secondary | ICD-10-CM

## 2023-11-23 DIAGNOSIS — D508 Other iron deficiency anemias: Secondary | ICD-10-CM | POA: Insufficient documentation

## 2023-11-23 DIAGNOSIS — T452X1A Poisoning by vitamins, accidental (unintentional), initial encounter: Secondary | ICD-10-CM

## 2023-11-23 DIAGNOSIS — E559 Vitamin D deficiency, unspecified: Secondary | ICD-10-CM | POA: Insufficient documentation

## 2023-11-23 DIAGNOSIS — R630 Anorexia: Secondary | ICD-10-CM | POA: Insufficient documentation

## 2023-11-23 DIAGNOSIS — Z87891 Personal history of nicotine dependence: Secondary | ICD-10-CM | POA: Diagnosis not present

## 2023-11-23 LAB — CBC WITH DIFFERENTIAL/PLATELET
Abs Immature Granulocytes: 0.01 10*3/uL (ref 0.00–0.07)
Basophils Absolute: 0 10*3/uL (ref 0.0–0.1)
Basophils Relative: 0 %
Eosinophils Absolute: 0.1 10*3/uL (ref 0.0–0.5)
Eosinophils Relative: 2 %
HCT: 35.3 % — ABNORMAL LOW (ref 36.0–46.0)
Hemoglobin: 10.8 g/dL — ABNORMAL LOW (ref 12.0–15.0)
Immature Granulocytes: 0 %
Lymphocytes Relative: 13 %
Lymphs Abs: 0.8 10*3/uL (ref 0.7–4.0)
MCH: 30.1 pg (ref 26.0–34.0)
MCHC: 30.6 g/dL (ref 30.0–36.0)
MCV: 98.3 fL (ref 80.0–100.0)
Monocytes Absolute: 0.4 10*3/uL (ref 0.1–1.0)
Monocytes Relative: 8 %
Neutro Abs: 4.3 10*3/uL (ref 1.7–7.7)
Neutrophils Relative %: 77 %
Platelets: 319 10*3/uL (ref 150–400)
RBC: 3.59 MIL/uL — ABNORMAL LOW (ref 3.87–5.11)
RDW: 14.2 % (ref 11.5–15.5)
WBC: 5.6 10*3/uL (ref 4.0–10.5)
nRBC: 0 % (ref 0.0–0.2)

## 2023-11-23 LAB — COMPREHENSIVE METABOLIC PANEL
ALT: 12 U/L (ref 0–44)
AST: 22 U/L (ref 15–41)
Albumin: 3.8 g/dL (ref 3.5–5.0)
Alkaline Phosphatase: 65 U/L (ref 38–126)
Anion gap: 8 (ref 5–15)
BUN: 10 mg/dL (ref 8–23)
CO2: 26 mmol/L (ref 22–32)
Calcium: 9.1 mg/dL (ref 8.9–10.3)
Chloride: 104 mmol/L (ref 98–111)
Creatinine, Ser: 0.8 mg/dL (ref 0.44–1.00)
GFR, Estimated: >60 mL/min (ref >60)
Glucose, Bld: 85 mg/dL (ref 70–99)
Potassium: 3.7 mmol/L (ref 3.5–5.1)
Sodium: 138 mmol/L (ref 135–145)
Total Bilirubin: 0.4 mg/dL (ref 0.0–1.2)
Total Protein: 7.6 g/dL (ref 6.5–8.1)

## 2023-11-23 LAB — IRON AND TIBC
Iron: 37 ug/dL (ref 28–170)
Saturation Ratios: 12 % (ref 10.4–31.8)
TIBC: 317 ug/dL (ref 250–450)
UIBC: 280 ug/dL

## 2023-11-23 LAB — VITAMIN D 25 HYDROXY (VIT D DEFICIENCY, FRACTURES): Vit D, 25-Hydroxy: 44.44 ng/mL (ref 30–100)

## 2023-11-23 LAB — VITAMIN B12: Vitamin B-12: 793 pg/mL (ref 180–914)

## 2023-11-23 LAB — FERRITIN: Ferritin: 22 ng/mL (ref 11–307)

## 2023-11-27 LAB — METHYLMALONIC ACID, SERUM: Methylmalonic Acid, Quantitative: 97 nmol/L (ref 0–378)

## 2023-11-28 NOTE — Progress Notes (Signed)
 [facility] [address], [place] [address]   CLINIC:  Medical Oncology/Hematology  PCP:  Otis Blocker, MD [address] [place] [address] [phone]   REASON FOR VISIT:  Follow-up for iron deficiency anemia   CURRENT THERAPY: Intermittent IV iron infusions    INTERVAL HISTORY:   [name].o. female returns for routine follow-up of iron deficiency anemia.  She was last seen by Sheril Dines PA-C on 08/21/2023.  She received [name]  x1  in October 2024.   At today's visit, she reports feeling fair.  She denies any recent hospitalizations, surgeries, or changes in her baseline health status.  She reports increased fatigue and headaches.  She has some lightheadedness. She denies any pica, chest pain, dyspnea on exertion, or syncope.  She has not noticed any recent black bowel movements or melanotic stool that she can recall.  She denies any other bleeding such as epistaxis, hematemesis, hematuria, or hematochezia.   She reports 70% energy and 50% appetite.  Her weight has been stable since her last visit.  ASSESSMENT & PLAN:  1.  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  She presented to ED with weakness and fatigue, found to be hemoccult positive with Hgb 8.3 (dropped from 10.0 two weeks prior).  She received 1 unit PRBC.   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  Renal function normal for age. - Last [name]  x 1 in October 2024 - Most recent labs (11/23/2023): Hgb 10.8/MCV 98.3.  Ferritin 22, iron saturation 12%.  Normal creatinine 0.80. - She does not recall the last time that she  noticed any melanotic stool - She is symptomatic with fatigue      - She is followed by the GI providers at [facility] - PLAN: Recommend IV [name]  x 2 - Repeat labs and RTC in 3 months   - Follow-up with GI as scheduled   2.  Vitamin D  deficiency, with subsequent overload - Patient previously required vitamin D  supplementation, but was mistakenly taking this too frequently and did not stop when instructed - Vitamin D  peaked at 193.00 on 05/03/2022 - Most recent labs (11/23/2023) show vitamin D  44.44 - PLAN: No vitamin D  supplement at this time. - We will restart her on the lower dose if appropriate in the future.  Recheck vitamin D  level in about 6 months (around July 2025)    3.  Borderline vitamin B12 deficiency: - Labs on 05/05/2023 show vitamin B12 at 655 with normal methylmalonic acid - She is not sure if she is taking her B12 supplement or not, but it is listed on her medication list from her pharmacy - Most recent labs (11/23/2023): Normal B12 793, MMA normal. - PLAN: Continue vitamin B12 supplement.  We will check B12 and methylmalonic acid in about 6 months (July 2025)   4.  Poor appetite and weight loss    - Reports decreased appetite for the past several months  - She has noted to have increasing memory deficits, and there is some concern that she may be forgetting to eat - Weight today (11/29/2023) stable compared  to weight 3 months ago - PLAN: Patient instructed to continue drinking 1-2 Ensure/Boost beverages daily. - We will check weight at follow-up appointment    PLAN SUMMARY: >> IV [name]  x 2 >> Labs in 3 months = CBC/D, CMP, ferritin, iron/TIBC >> OFFICE visit in 3 months (1 week after labs)     REVIEW OF SYSTEMS:   Review of Systems  Constitutional:  Positive for appetite change and fatigue. Negative for chills, diaphoresis, fever and unexpected weight change.  HENT:   Negative for lump/mass and nosebleeds.   Eyes:  Negative for eye  problems.  Respiratory:  Negative for cough, hemoptysis and shortness of breath.   Cardiovascular:  Negative for chest pain, leg swelling and palpitations.  Gastrointestinal:  Negative for abdominal pain, blood in stool, constipation, diarrhea, nausea and vomiting.  Genitourinary:  Negative for hematuria.   Skin: Negative.   Neurological:  Positive for headaches and light-headedness. Negative for dizziness.  Hematological:  Does not bruise/bleed easily.     PHYSICAL EXAM:  ECOG PERFORMANCE STATUS: 2 - Symptomatic, <50% confined to bed  Vitals:   11/29/23 0908  BP: 125/71  Pulse: (!) 55  Resp: 18  Temp: (!) 96.6 F (35.9 C)  SpO2: 96%    Filed Weights   11/29/23 0908  Weight: 127 lb 3.3 oz (57.7 kg)    Physical Exam Constitutional:      Appearance: Normal appearance. She is normal weight.  Cardiovascular:     Rate and Rhythm: Bradycardia present.     Heart sounds: Normal heart sounds.     Comments: HR 55 Pulmonary:     Breath sounds: Normal breath sounds.  Neurological:     General: No focal deficit present.     Mental Status: Mental status is at baseline.  Psychiatric:        Behavior: Behavior normal. Behavior is cooperative.     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 four colon polyps removed. No source  for change in bowel habits  identified 3. Rectal bleeding due to large internal hemorrhoids    COLONOSCOPY W/ BIOPSIES  6 2010   Dr. Nolene Baumgarten: Polypoid appearing lesion of the ascending colon, 3 mm sessile rectal polyp, small internal hemorrhoids. Pathology revealed polypoid mucosa, no adenomatous changes   Double-balloon enteroscopy, antegrade  April 2011   Dr. Hercules Lombard: Multiple duodenal and jejunal angiectasia is treated with APC.   ENTEROSCOPY N/A 06/02/2022   Procedure: ENTEROSCOPY;  Surgeon: Suzette Espy, MD;  Location: [facility];  Service: Endoscopy;  Laterality: N/A;  with pediatric colonoscope   ESOPHAGOGASTRODUODENOSCOPY  04/2009   Dr. Nolene Baumgarten: Patent Schatzki ring dilated with advancing the scope, patchy erythema in the antrum, 2 small AVMs in the duodenal  bulb, 2 additional AVMs noted in the second portion the duodenum, mild gastritis on pathology   ESOPHAGOGASTRODUODENOSCOPY N/A 06/29/2015   SLF: 1. 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 small hiatal hernia 3. Mild non-erosive gastririts- NO obvious source for dyspepsia identified.    ESOPHAGOGASTRODUODENOSCOPY N/A 03/13/2020   Fields: Low-grade narrowing Schatzki ring, small hiatal hernia, multiple nonbleeding angiectasia's in the jejunum treated with APC.  Spot tattoo in jejunum at 90 cm from the incisors.   ESOPHAGOGASTRODUODENOSCOPY N/A 06/02/2022   Procedure: ESOPHAGOGASTRODUODENOSCOPY (EGD);  Surgeon: Suzette Espy, MD;  Location: [facility];  Service: Endoscopy;  Laterality: N/A;   EYE SURGERY     HOT HEMOSTASIS  06/02/2022   Procedure: HOT HEMOSTASIS (ARGON PLASMA COAGULATION/BICAP);  Surgeon: Suzette Espy, MD;  Location: [facility];  Service: Endoscopy;;    SOCIAL HISTORY:  Social History   Socioeconomic History   Marital status: Divorced    Spouse name: Not on file   Number of children: 1   Years of education: Not on file   Highest education level: Not on file  Occupational History   Not on file  Tobacco Use   Smoking  status: Former    Current packs/day: 0.00    Average packs/day: 1 pack/day for 25.0 years (25.0 ttl pk-yrs)    Types: Cigarettes    Start date: 09/13/1982    Quit date: 09/14/2007    Years since quitting: 16.2   Smokeless tobacco: Never  Vaping Use   Vaping status: Never Used  Substance and Sexual Activity   Alcohol  use: Not Currently    Comment: occasional/rare (Brandy every now and then)   Drug use: No   Sexual activity: Not Currently    Birth control/protection: Post-menopausal, Surgical  Other Topics Concern   Not on file  Social History Narrative   Not on file   Social Drivers of Health   Financial Resource Strain: Not on file  Food Insecurity: Not on file  Transportation Needs: Not on file  Physical Activity: Not on file  Stress: Not on file  Social Connections: Unknown (03/29/2022)   Received from [facility], [facility]   Social Network    Social Network: Not on file  Intimate Partner Violence: Unknown (02/18/2022)   Received from [facility], [facility]   HITS    Physically Hurt: Not on file    Insult or Talk Down To: Not on file    Threaten Physical Harm: Not on file    Scream or Curse: Not on file    FAMILY HISTORY:  Family History  Problem Relation Age of Onset   Heart failure Mother    Diabetes Sister    Heart attack Father    Colon cancer Neg Hx    Gastric cancer Neg Hx    Esophageal cancer Neg Hx     CURRENT MEDICATIONS:  Outpatient Encounter Medications as of 11/29/2023  Medication Sig   acetaminophen  (TYLENOL ) 500 MG tablet Take 1,000 mg by mouth every 6 (six) hours as needed for moderate pain.   Acetaminophen  325 MG CAPS Take by mouth.   amitriptyline  (ELAVIL ) 25 MG tablet Take 25 mg by mouth at bedtime.   aspirin  EC 325 MG tablet Take 325 mg by mouth daily.   atorvastatin  (LIPITOR) 40 MG tablet Take 40 mg by mouth daily.   escitalopram  (LEXAPRO ) 10 MG tablet Take 10 mg by mouth daily.   ezetimibe  (ZETIA ) 10 MG tablet Take 10 mg by  mouth daily.  mirtazapine  (REMERON ) 15 MG tablet Take 15 mg by mouth at bedtime.   omega-3 fish oil (MAXEPA) 1000 MG CAPS capsule Take 1 capsule by mouth daily.   ondansetron  (ZOFRAN -ODT) 4 MG disintegrating tablet Take 1 tablet (4 mg total) by mouth every 8 (eight) hours as needed.   pantoprazole  (PROTONIX ) 40 MG tablet Take 40 mg by mouth 2 (two) times daily.   potassium chloride  SA (KLOR-CON  M) 20 MEQ tablet Take 20 mEq by mouth daily.   [DISCONTINUED] Cyanocobalamin  (VITAMIN B-12 CR) 1000 MCG TBCR Take 1 tablet (1,000 mcg total) by mouth daily.   Cyanocobalamin  (VITAMIN B-12 CR) 1000 MCG TBCR Take 1 tablet (1,000 mcg total) by mouth daily.   No facility-administered encounter medications on file as of 11/29/2023.    ALLERGIES:  Allergies  Allergen Reactions   Latex Hives, Itching and Rash   Sulfa Antibiotics Hives and Itching    Other reaction(s): hives   Other Hives   Pravastatin Sodium Other (See Comments)    LABORATORY DATA:  I have reviewed the labs as listed.  CBC    Component Value Date/Time   WBC 5.6 11/23/2023 0913   RBC 3.59 (L) 11/23/2023 0913   HGB 10.8 (L) 11/23/2023 0913   HCT 35.3 (L) 11/23/2023 0913   PLT 319 11/23/2023 0913   MCV 98.3 11/23/2023 0913   MCH 30.1 11/23/2023 0913   MCHC 30.6 11/23/2023 0913   RDW 14.2 11/23/2023 0913   LYMPHSABS 0.8 11/23/2023 0913   MONOABS 0.4 11/23/2023 0913   EOSABS 0.1 11/23/2023 0913   BASOSABS 0.0 11/23/2023 0913      Latest Ref Rng & Units 11/23/2023    9:13 AM 08/09/2023    8:32 AM 05/05/2023   12:10 PM  CMP  Glucose 70 - 99 mg/dL 85  161  096   BUN 8 - 23 mg/dL 10  9  9    Creatinine 0.44 - 1.00 mg/dL 0.45  4.09  8.11   Sodium 135 - 145 mmol/L 138  141  139   Potassium 3.5 - 5.1 mmol/L 3.7  3.5  3.9   Chloride 98 - 111 mmol/L 104  107  106   CO2 22 - 32 mmol/L 26  27  25    Calcium  8.9 - 10.3 mg/dL 9.1  8.9  9.2   Total Protein 6.5 - 8.1 g/dL 7.6  6.2  7.4   Total Bilirubin 0.0 - 1.2 mg/dL 0.4  0.4  0.5    Alkaline Phos 38 - 126 U/L 65  55  66   AST 15 - 41 U/L 22  17  19    ALT 0 - 44 U/L 12  12  14      DIAGNOSTIC IMAGING:  I have independently reviewed the relevant imaging and discussed with the patient.   WRAP UP:  All questions were answered. The patient knows to call the clinic with any problems, questions or concerns.  Medical decision making: Moderate  Time spent on visit: I spent 20 minutes counseling the patient face to face. The total time spent in the appointment was 30 minutes and more than 50% was on counseling.  Sonnie Dusky, PA-C  11/29/23 9:58 AM

## 2023-11-29 ENCOUNTER — Inpatient Hospital Stay (HOSPITAL_BASED_OUTPATIENT_CLINIC_OR_DEPARTMENT_OTHER): Payer: 59 | Admitting: Physician Assistant

## 2023-11-29 ENCOUNTER — Encounter: Payer: Self-pay | Admitting: Hematology

## 2023-11-29 VITALS — BP 125/71 | HR 55 | Temp 96.6°F | Resp 18 | Wt 127.2 lb

## 2023-11-29 DIAGNOSIS — D500 Iron deficiency anemia secondary to blood loss (chronic): Secondary | ICD-10-CM | POA: Diagnosis not present

## 2023-11-29 DIAGNOSIS — E538 Deficiency of other specified B group vitamins: Secondary | ICD-10-CM

## 2023-11-29 DIAGNOSIS — D508 Other iron deficiency anemias: Secondary | ICD-10-CM | POA: Diagnosis not present

## 2023-11-29 MED ORDER — VITAMIN B-12 ER 1000 MCG PO TBCR: 1.0000 | EXTENDED_RELEASE_TABLET | Freq: Every day | ORAL | 3 refills | Status: DC

## 2023-11-29 NOTE — Patient Instructions (Signed)
 [facility] at [facility] Discharge Instructions  You were seen today by Sheril Dines PA-C for your iron deficiency anemia.  Your blood and iron levels are low, so we will schedule you for IV IRON x 2 doses.  You should also continue to follow-up with your gastroenterologist [facility] doctor) for ongoing monitoring and management of your intestinal blood loss.     You should CONTINUE taking over-the-counter vitamin B12 supplement 1000 mcg daily  You should CONTINUE drinking Ensure (or Boost) 1-2 drinks daily due to your decreased appetite and weight loss.    I will see you for follow-up visit in 3 months.  ** Thank you for trusting me with your healthcare!  I strive to provide all of my patients with quality care at each visit.  If you receive a survey for this visit, I would be so grateful to you for taking the time to provide feedback.  Thank you in advance!  ~ Jah Alarid                   Dr. Paulett Boros   &   Sheril Dines, PA-C   - - - - - - - - - - - - - - - - - -     Thank you for choosing [facility] at [facility] to provide your oncology and hematology care.  To afford each patient quality time with our provider, please arrive at least 15 minutes before your scheduled appointment time.   If you have a lab appointment with the Cancer Center please come in thru the Main Entrance and check in at the main information desk.  You need to re-schedule your appointment should you arrive 10 or more minutes late.  We strive to give you quality time with our providers, and arriving late affects you and other patients whose appointments are after yours.  Also, if you no show three or more times for appointments you may be dismissed from the clinic at the providers discretion.     Again, thank you for choosing [facility].  Our hope is that these requests will decrease the amount of time that you wait before being seen  by our physicians.       _____________________________________________________________  Should you have questions after your visit to [facility], please contact our office at [phone] and follow the prompts.  Our office hours are 8:00 a.m. and 4:30 p.m. Monday - Friday.  Please note that voicemails left after 4:00 p.m. may not be returned until the following business day.  We are closed weekends and major holidays.  You do have access to a nurse 24-7, just call the main number to the clinic [phone] and do not press any options, hold on the line and a nurse will answer the phone.    For prescription refill requests, have your pharmacy contact our office and allow 72 hours.    Due to Covid, you will need to wear a mask upon entering the hospital. If you do not have a mask, a mask will be given to you at the Main Entrance upon arrival. For doctor visits, patients may have 1 support person age 54 or older with them. For treatment visits, patients can not have anyone with them due to social distancing guidelines and our immunocompromised population.

## 2023-11-30 MED FILL — Ferumoxytol Inj 510 MG/17ML (30 MG/ML) (Elemental Fe): INTRAVENOUS | Qty: 17 | Status: AC

## 2023-12-01 ENCOUNTER — Inpatient Hospital Stay: Payer: 59

## 2023-12-08 ENCOUNTER — Inpatient Hospital Stay: Payer: 59

## 2023-12-08 VITALS — BP 114/64 | HR 65 | Temp 97.9°F | Resp 18

## 2023-12-08 DIAGNOSIS — D500 Iron deficiency anemia secondary to blood loss (chronic): Secondary | ICD-10-CM

## 2023-12-08 DIAGNOSIS — D508 Other iron deficiency anemias: Secondary | ICD-10-CM | POA: Diagnosis not present

## 2023-12-08 MED ORDER — SODIUM CHLORIDE 0.9 % IV SOLN
510.0000 mg | Freq: Once | INTRAVENOUS | Status: AC
  Administered 2023-12-08: 510 mg via INTRAVENOUS
  Filled 2023-12-08: qty 510
  Filled 2023-12-08: qty 17

## 2023-12-08 MED ORDER — SODIUM CHLORIDE 0.9 % IV SOLN: INTRAVENOUS | Status: DC

## 2023-12-08 MED ORDER — CETIRIZINE HCL 10 MG PO TABS
10.0000 mg | ORAL_TABLET | Freq: Once | ORAL | Status: AC
  Administered 2023-12-08: 10 mg via ORAL
  Filled 2023-12-08: qty 1

## 2023-12-08 MED ORDER — ACETAMINOPHEN 325 MG PO TABS
650.0000 mg | ORAL_TABLET | Freq: Once | ORAL | Status: AC
  Administered 2023-12-08: 650 mg via ORAL
  Filled 2023-12-08: qty 2

## 2023-12-08 NOTE — Patient Instructions (Signed)

## 2023-12-08 NOTE — Progress Notes (Signed)
Patient presents today for [name] infusion per providers order.  Vital signs WNL.  Patient has no new complaints at this time.  Peripheral IV started and blood return noted pre and post infusion.  Stable during infusion without adverse affects.  Vital signs stable.  No complaints at this time.  Discharge from clinic via wheelchair in stable condition.  Alert and oriented X 3.  Follow up with [facility] as scheduled.

## 2023-12-15 ENCOUNTER — Inpatient Hospital Stay: Payer: 59

## 2023-12-15 VITALS — BP 112/63 | HR 64 | Temp 97.8°F | Resp 18

## 2023-12-15 DIAGNOSIS — D508 Other iron deficiency anemias: Secondary | ICD-10-CM | POA: Diagnosis not present

## 2023-12-15 DIAGNOSIS — D500 Iron deficiency anemia secondary to blood loss (chronic): Secondary | ICD-10-CM

## 2023-12-15 MED ORDER — ACETAMINOPHEN 325 MG PO TABS
650.0000 mg | ORAL_TABLET | Freq: Once | ORAL | Status: AC
  Administered 2023-12-15: 650 mg via ORAL
  Filled 2023-12-15: qty 2

## 2023-12-15 MED ORDER — CETIRIZINE HCL 10 MG PO TABS
10.0000 mg | ORAL_TABLET | Freq: Once | ORAL | Status: AC
  Administered 2023-12-15: 10 mg via ORAL
  Filled 2023-12-15: qty 1

## 2023-12-15 MED ORDER — SODIUM CHLORIDE 0.9 % IV SOLN
510.0000 mg | Freq: Once | INTRAVENOUS | Status: AC
  Administered 2023-12-15: 510 mg via INTRAVENOUS
  Filled 2023-12-15: qty 510

## 2023-12-15 MED ORDER — SODIUM CHLORIDE 0.9 % IV SOLN
INTRAVENOUS | Status: DC
Start: 2023-12-15 — End: 2023-12-15

## 2023-12-15 NOTE — Patient Instructions (Signed)
 [facility] [place] - A DEPT OF [facility]  Discharge Instructions: Thank you for choosing [facility] to provide your oncology and hematology care.  If you have a lab appointment with the Cancer Center - please note that after April 8th, 2024, all labs will be drawn in the cancer center.  You do not have to check in or register with the main entrance as you have in the past but will complete your check-in in the cancer center.  Wear comfortable clothing and clothing appropriate for easy access to any Portacath or PICC line.   We strive to give you quality time with your provider. You may need to reschedule your appointment if you arrive late (15 or more minutes).  Arriving late affects you and other patients whose appointments are after yours.  Also, if you miss three or more appointments without notifying the office, you may be dismissed from the clinic at the provider's discretion.      For prescription refill requests, have your pharmacy contact our office and allow 72 hours for refills to be completed.    Today you received [name] IV iron infusion.     BELOW ARE SYMPTOMS THAT SHOULD BE REPORTED IMMEDIATELY: *FEVER GREATER THAN 100.4 F (38 C) OR HIGHER *CHILLS OR SWEATING *NAUSEA AND VOMITING THAT IS NOT CONTROLLED WITH YOUR NAUSEA MEDICATION *UNUSUAL SHORTNESS OF BREATH *UNUSUAL BRUISING OR BLEEDING *URINARY PROBLEMS (pain or burning when urinating, or frequent urination) *BOWEL PROBLEMS (unusual diarrhea, constipation, pain near the anus) TENDERNESS IN MOUTH AND THROAT WITH OR WITHOUT PRESENCE OF ULCERS (sore throat, sores in mouth, or a toothache) UNUSUAL RASH, SWELLING OR PAIN  UNUSUAL VAGINAL DISCHARGE OR ITCHING   Items with * indicate a potential emergency and should be followed up as soon as possible or go to the Emergency Department if any problems should occur.  Please show the CHEMOTHERAPY ALERT CARD or IMMUNOTHERAPY ALERT CARD at  check-in to the Emergency Department and triage nurse.  Should you have questions after your visit or need to cancel or reschedule your appointment, please contact [facility] [place] - A DEPT OF [facility] [phone]  and follow the prompts.  Office hours are 8:00 a.m. to 4:30 p.m. Monday - Friday. Please note that voicemails left after 4:00 p.m. may not be returned until the following business day.  We are closed weekends and major holidays. You have access to a nurse at all times for urgent questions. Please call the main number to the clinic [phone] and follow the prompts.  For any non-urgent questions, you may also contact your provider using MyChart. We now offer e-Visits for anyone 49 and older to request care online for non-urgent symptoms. For details visit [email or web address].   Also download the MyChart app! Go to the app store, search "MyChart", open the app, select , and log in with your MyChart username and password.

## 2023-12-15 NOTE — Progress Notes (Signed)
 Patient presents today for iron infusion. Patient is in satisfactory condition with no new complaints voiced.  Vital signs are stable.  We will proceed with infusion per provider orders.    Peripheral IV started with good blood return pre and post infusion.  [name] 510 mg given today per MD orders. Tolerated infusion without adverse affects. Vital signs stable. No complaints at this time. Discharged from clinic ambulatory in stable condition. Alert and oriented x 3. F/U with [facility] as scheduled.

## 2024-02-21 ENCOUNTER — Encounter: Payer: Self-pay | Admitting: Physician Assistant

## 2024-02-27 ENCOUNTER — Inpatient Hospital Stay: Payer: 59 | Attending: Hematology

## 2024-02-27 DIAGNOSIS — E559 Vitamin D deficiency, unspecified: Secondary | ICD-10-CM | POA: Diagnosis not present

## 2024-02-27 DIAGNOSIS — R630 Anorexia: Secondary | ICD-10-CM | POA: Insufficient documentation

## 2024-02-27 DIAGNOSIS — D508 Other iron deficiency anemias: Secondary | ICD-10-CM | POA: Insufficient documentation

## 2024-02-27 DIAGNOSIS — Z87891 Personal history of nicotine dependence: Secondary | ICD-10-CM | POA: Diagnosis not present

## 2024-02-27 DIAGNOSIS — D500 Iron deficiency anemia secondary to blood loss (chronic): Secondary | ICD-10-CM

## 2024-02-27 DIAGNOSIS — K909 Intestinal malabsorption, unspecified: Secondary | ICD-10-CM | POA: Insufficient documentation

## 2024-02-27 LAB — CBC WITH DIFFERENTIAL/PLATELET
Abs Immature Granulocytes: 0.01 10*3/uL (ref 0.00–0.07)
Basophils Absolute: 0 10*3/uL (ref 0.0–0.1)
Basophils Relative: 0 %
Eosinophils Absolute: 0.1 10*3/uL (ref 0.0–0.5)
Eosinophils Relative: 1 %
HCT: 37.6 % (ref 36.0–46.0)
Hemoglobin: 11.6 g/dL — ABNORMAL LOW (ref 12.0–15.0)
Immature Granulocytes: 0 %
Lymphocytes Relative: 15 %
Lymphs Abs: 0.8 10*3/uL (ref 0.7–4.0)
MCH: 29.4 pg (ref 26.0–34.0)
MCHC: 30.9 g/dL (ref 30.0–36.0)
MCV: 95.4 fL (ref 80.0–100.0)
Monocytes Absolute: 0.6 10*3/uL (ref 0.1–1.0)
Monocytes Relative: 11 %
Neutro Abs: 3.6 10*3/uL (ref 1.7–7.7)
Neutrophils Relative %: 73 %
Platelets: 265 10*3/uL (ref 150–400)
RBC: 3.94 MIL/uL (ref 3.87–5.11)
RDW: 14.6 % (ref 11.5–15.5)
WBC: 5 10*3/uL (ref 4.0–10.5)
nRBC: 0 % (ref 0.0–0.2)

## 2024-02-27 LAB — COMPREHENSIVE METABOLIC PANEL WITH GFR
ALT: 9 U/L (ref 0–44)
AST: 18 U/L (ref 15–41)
Albumin: 3.7 g/dL (ref 3.5–5.0)
Alkaline Phosphatase: 63 U/L (ref 38–126)
Anion gap: 9 (ref 5–15)
BUN: 10 mg/dL (ref 8–23)
CO2: 26 mmol/L (ref 22–32)
Calcium: 9.5 mg/dL (ref 8.9–10.3)
Chloride: 107 mmol/L (ref 98–111)
Creatinine, Ser: 0.8 mg/dL (ref 0.44–1.00)
GFR, Estimated: >60 mL/min (ref >60)
Glucose, Bld: 79 mg/dL (ref 70–99)
Potassium: 4 mmol/L (ref 3.5–5.1)
Sodium: 142 mmol/L (ref 135–145)
Total Bilirubin: 0.3 mg/dL (ref 0.0–1.2)
Total Protein: 7 g/dL (ref 6.5–8.1)

## 2024-02-27 LAB — FERRITIN: Ferritin: 48 ng/mL (ref 11–307)

## 2024-02-27 LAB — IRON AND TIBC
Iron: 41 ug/dL (ref 28–170)
Saturation Ratios: 14 % (ref 10.4–31.8)
TIBC: 284 ug/dL (ref 250–450)
UIBC: 243 ug/dL

## 2024-03-04 NOTE — Progress Notes (Unsigned)
 [facility] [address], [place] [address]   CLINIC:  Medical Oncology/Hematology  PCP:  Otis Blocker, MD [address] [place] [address] [phone]   REASON FOR VISIT:  Follow-up for iron deficiency anemia   CURRENT THERAPY: Intermittent IV iron infusions    INTERVAL HISTORY:   [name].o. female returns for routine follow-up of iron deficiency anemia.  She was last seen by Sheril Dines PA-C on 11/29/2023.  She received [name]  x2 in January 2025.   At today's visit, she reports feeling fairly well, apart from head cold / seasonal allergies.  She denies any recent hospitalizations, surgeries, or changes in her baseline health status.  She reports increased fatigue and headaches.  She denies any pica, lightheadedness, chest pain, dyspnea on exertion, or syncope.  She has not noticed any recent black bowel movements or melanotic stool that she can recall.  She denies any other bleeding such as epistaxis, hematemesis, hematuria, or hematochezia.   She reports 50% energy and 50% appetite.  Her weight has been stable since her last visit.  ASSESSMENT & PLAN:  1.  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  She presented to ED with weakness and fatigue, found to be hemoccult positive with Hgb 8.3 (dropped from 10.0 two weeks prior).  She received 1 unit PRBC.   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  Renal function normal for age. - Last [name]  x 2 in January 2025 - Most recent labs (02/27/2024): Hgb 11.6/MCV 95.4.  Ferritin 48, iron saturation 14%.  Normal creatinine 0.80. - She  does not recall the last time that she noticed any melanotic stool - She is symptomatic with fatigue      - She is followed by the GI providers at [facility] - PLAN: Recommend IV [name]  x 2 - Repeat labs and RTC in 3 months   - Follow-up with GI   2.  Vitamin D  deficiency, with subsequent overload - Patient previously required vitamin D  supplementation, but was mistakenly taking this too frequently and did not stop when instructed - Vitamin D  peaked at 193.00 on 05/03/2022 - Most recent labs (11/23/2023) show vitamin D  44.44 - PLAN: No vitamin D  supplement at this time. - We will restart her on the lower dose if appropriate in the future.  Recheck vitamin D  level in about around July 2025  3.  Borderline vitamin B12 deficiency: - Labs on 05/05/2023 show vitamin B12 at 655 with normal methylmalonic acid - She is not sure if she is taking her B12 supplement or not, but it is listed on her medication list from her pharmacy - Most recent labs (11/23/2023): Normal B12 793, MMA normal. - PLAN: Continue vitamin B12 supplement.  We will check B12 and methylmalonic acid around July 2025  4.  Poor appetite and weight loss    - Reports decreased appetite for the past several months  - She has noted to have increasing memory deficits, and there is some concern that she may be forgetting to eat - Weight today (03/05/24) stable compared to weight 3 months ago -  PLAN: Patient instructed to continue drinking 1-2 Ensure/Boost beverages daily. - We will check weight at follow-up appointment    PLAN SUMMARY: >> IV [name]  x 2 >> Labs in 3 months = CBC/D, CMP, ferritin, iron/TIBC, B12, MMA, vitamin D  >> OFFICE visit in 3 months (1 week after labs)     REVIEW OF SYSTEMS:   Review of Systems  Constitutional:  Positive for fatigue. Negative for appetite change, chills, diaphoresis, fever and unexpected weight change.  HENT:   Negative for lump/mass and nosebleeds.   Eyes:   Negative for eye problems.  Respiratory:  Negative for cough, hemoptysis and shortness of breath.   Cardiovascular:  Negative for chest pain, leg swelling and palpitations.  Gastrointestinal:  Negative for abdominal pain, blood in stool, constipation, diarrhea, nausea and vomiting.  Genitourinary:  Negative for hematuria.   Musculoskeletal:  Positive for arthralgias.  Skin: Negative.   Neurological:  Positive for headaches. Negative for dizziness and light-headedness.  Hematological:  Does not bruise/bleed easily.     PHYSICAL EXAM:  ECOG PERFORMANCE STATUS: 2 - Symptomatic, <50% confined to bed  Vitals:   03/05/24 0846  BP: 126/83  Pulse: 69  Resp: 16  Temp: 99 F (37.2 C)  SpO2: 99%   Filed Weights   03/05/24 0846  Weight: 126 lb 1.7 oz (57.2 kg)   Physical Exam Constitutional:      Appearance: Normal appearance. She is normal weight.  Cardiovascular:     Rate and Rhythm: Normal rate and regular rhythm.     Heart sounds: Normal heart sounds.  Pulmonary:     Breath sounds: Normal breath sounds.  Neurological:     General: No focal deficit present.     Mental Status: Mental status is at baseline.  Psychiatric:        Behavior: Behavior normal. Behavior is cooperative.    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 four colon polyps removed. No  source  for change in bowel habits identified 3. Rectal bleeding due to large internal hemorrhoids    COLONOSCOPY W/ BIOPSIES  6 2010   Dr. Nolene Baumgarten: Polypoid appearing lesion of the ascending colon, 3 mm sessile rectal polyp, small internal hemorrhoids. Pathology revealed polypoid mucosa, no adenomatous changes   Double-balloon enteroscopy, antegrade  April 2011   Dr. Hercules Lombard: Multiple duodenal and jejunal angiectasia is treated with APC.   ENTEROSCOPY N/A 06/02/2022   Procedure: ENTEROSCOPY;  Surgeon: Suzette Espy, MD;  [facility]: [facility];  Service: Endoscopy;  Laterality: N/A;  with pediatric colonoscope   ESOPHAGOGASTRODUODENOSCOPY  04/2009   Dr. Nolene Baumgarten: Patent Schatzki ring dilated with advancing the scope, patchy erythema in the antrum, 2 small AVMs in the duodenal bulb, 2 additional AVMs noted  in the second portion the duodenum, mild gastritis on pathology   ESOPHAGOGASTRODUODENOSCOPY N/A 06/29/2015   SLF: 1. 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 small hiatal hernia 3. Mild non-erosive gastririts- NO obvious source for dyspepsia identified.    ESOPHAGOGASTRODUODENOSCOPY N/A 03/13/2020   Fields: Low-grade narrowing Schatzki ring, small hiatal hernia, multiple nonbleeding angiectasia's in the jejunum treated with APC.  Spot tattoo in jejunum at 90 cm from the incisors.   ESOPHAGOGASTRODUODENOSCOPY N/A 06/02/2022   Procedure: ESOPHAGOGASTRODUODENOSCOPY (EGD);  Surgeon: Suzette Espy, MD;  Location: [facility];  Service: Endoscopy;  Laterality: N/A;   EYE SURGERY     HOT HEMOSTASIS  06/02/2022   Procedure: HOT HEMOSTASIS (ARGON PLASMA COAGULATION/BICAP);  Surgeon: Suzette Espy, MD;  Location: [facility];  Service: Endoscopy;;    SOCIAL HISTORY:  Social History   Socioeconomic History   Marital status: Divorced    Spouse name: Not on file   Number of children: 1   Years of education: Not on file   Highest education level: Not on file  Occupational History   Not on  file  Tobacco Use   Smoking status: Former    Current packs/day: 0.00    Average packs/day: 1 pack/day for 25.0 years (25.0 ttl pk-yrs)    Types: Cigarettes    Start date: 09/13/1982    Quit date: 09/14/2007    Years since quitting: 16.4   Smokeless tobacco: Never  Vaping Use   Vaping status: Never Used  Substance and Sexual Activity   Alcohol  use: Not Currently    Comment: occasional/rare (Brandy every now and then)   Drug use: No   Sexual activity: Not Currently    Birth control/protection: Post-menopausal, Surgical  Other Topics Concern   Not on file  Social History Narrative   Not on file   Social Drivers of Health   Financial Resource Strain: Not on file  Food Insecurity: Not on file  Transportation Needs: Not on file  Physical Activity: Not on file  Stress: Not on file  Social Connections: Unknown (03/29/2022)   Received from [facility]   Social Network    Social Network: Not on file  Intimate Partner Violence: Unknown (02/18/2022)   Received from [facility], [facility]   HITS    Physically Hurt: Not on file    Insult or Talk Down To: Not on file    Threaten Physical Harm: Not on file    Scream or Curse: Not on file    FAMILY HISTORY:  Family History  Problem Relation Age of Onset   Heart failure Mother    Diabetes Sister    Heart attack Father    Colon cancer Neg Hx    Gastric cancer Neg Hx    Esophageal cancer Neg Hx     CURRENT MEDICATIONS:  Outpatient Encounter Medications as of 03/05/2024  Medication Sig   acetaminophen  (TYLENOL ) 500 MG tablet Take 1,000 mg by mouth every 6 (six) hours as needed for moderate pain.   amitriptyline  (ELAVIL ) 25 MG tablet Take 25 mg by mouth at bedtime.   aspirin  EC 325 MG tablet Take 325 mg by mouth daily.   atorvastatin  (LIPITOR) 40 MG tablet Take 40 mg by mouth daily.   escitalopram  (LEXAPRO ) 10 MG tablet Take 10 mg by mouth daily.   ezetimibe  (ZETIA ) 10 MG tablet Take 10 mg by mouth daily.    mirtazapine (REMERON SOL-TAB) 30 MG disintegrating tablet Take 30 mg by mouth at  bedtime.   omega-3 fish oil (MAXEPA) 1000 MG CAPS capsule Take 1 capsule by mouth daily.   ondansetron  (ZOFRAN -ODT) 4 MG disintegrating tablet Take 1 tablet (4 mg total) by mouth every 8 (eight) hours as needed.   pantoprazole  (PROTONIX ) 40 MG tablet Take 40 mg by mouth 2 (two) times daily.   potassium chloride  (MICRO-K ) 10 MEQ CR capsule Take 10 mEq by mouth 2 (two) times daily.   potassium chloride  SA (KLOR-CON  M) 20 MEQ tablet Take 20 mEq by mouth daily.   [DISCONTINUED] Cyanocobalamin  (VITAMIN B-12 CR) 1000 MCG TBCR Take 1 tablet (1,000 mcg total) by mouth daily.   Cyanocobalamin  (VITAMIN B-12 CR) 1000 MCG TBCR Take 1 tablet (1,000 mcg total) by mouth daily.   [DISCONTINUED] Acetaminophen  325 MG CAPS Take by mouth.   [DISCONTINUED] mirtazapine (REMERON) 15 MG tablet Take 15 mg by mouth at bedtime.   No facility-administered encounter medications on file as of 03/05/2024.    ALLERGIES:  Allergies  Allergen Reactions   Latex Hives, Itching and Rash   Sulfa Antibiotics Hives and Itching    Other reaction(s): hives   Other Hives   Pravastatin Sodium Other (See Comments)    LABORATORY DATA:  I have reviewed the labs as listed.  CBC    Component Value Date/Time   WBC 5.0 02/27/2024 0923   RBC 3.94 02/27/2024 0923   HGB 11.6 (L) 02/27/2024 0923   HCT 37.6 02/27/2024 0923   PLT 265 02/27/2024 0923   MCV 95.4 02/27/2024 0923   MCH 29.4 02/27/2024 0923   MCHC 30.9 02/27/2024 0923   RDW 14.6 02/27/2024 0923   LYMPHSABS 0.8 02/27/2024 0923   MONOABS 0.6 02/27/2024 0923   EOSABS 0.1 02/27/2024 0923   BASOSABS 0.0 02/27/2024 0923      Latest Ref Rng & Units 02/27/2024    9:23 AM 11/23/2023    9:13 AM 08/09/2023    8:32 AM  CMP  Glucose 70 - 99 mg/dL 79  85  478   BUN 8 - 23 mg/dL 10  10  9    Creatinine 0.44 - 1.00 mg/dL 2.95  6.21  3.08   Sodium 135 - 145 mmol/L 142  138  141   Potassium 3.5 -  5.1 mmol/L 4.0  3.7  3.5   Chloride 98 - 111 mmol/L 107  104  107   CO2 22 - 32 mmol/L 26  26  27    Calcium  8.9 - 10.3 mg/dL 9.5  9.1  8.9   Total Protein 6.5 - 8.1 g/dL 7.0  7.6  6.2   Total Bilirubin 0.0 - 1.2 mg/dL 0.3  0.4  0.4   Alkaline Phos 38 - 126 U/L 63  65  55   AST 15 - 41 U/L 18  22  17    ALT 0 - 44 U/L 9  12  12      DIAGNOSTIC IMAGING:  I have independently reviewed the relevant imaging and discussed with the patient.   WRAP UP:  All questions were answered. The patient knows to call the clinic with any problems, questions or concerns.  Medical decision making: Moderate  Time spent on visit: I spent 20 minutes counseling the patient face to face. The total time spent in the appointment was 30 minutes and more than 50% was on counseling.  Sonnie Dusky, PA-C  03/05/24 9:24 AM

## 2024-03-05 ENCOUNTER — Inpatient Hospital Stay (HOSPITAL_BASED_OUTPATIENT_CLINIC_OR_DEPARTMENT_OTHER): Payer: 59 | Admitting: Physician Assistant

## 2024-03-05 VITALS — BP 126/83 | HR 69 | Temp 99.0°F | Resp 16 | Wt 126.1 lb

## 2024-03-05 DIAGNOSIS — D500 Iron deficiency anemia secondary to blood loss (chronic): Secondary | ICD-10-CM

## 2024-03-05 DIAGNOSIS — T452X1A Poisoning by vitamins, accidental (unintentional), initial encounter: Secondary | ICD-10-CM

## 2024-03-05 DIAGNOSIS — R634 Abnormal weight loss: Secondary | ICD-10-CM

## 2024-03-05 DIAGNOSIS — E559 Vitamin D deficiency, unspecified: Secondary | ICD-10-CM

## 2024-03-05 DIAGNOSIS — D508 Other iron deficiency anemias: Secondary | ICD-10-CM | POA: Diagnosis not present

## 2024-03-05 DIAGNOSIS — E538 Deficiency of other specified B group vitamins: Secondary | ICD-10-CM

## 2024-03-05 DIAGNOSIS — Z87891 Personal history of nicotine dependence: Secondary | ICD-10-CM

## 2024-03-05 MED ORDER — VITAMIN B-12 ER 1000 MCG PO TBCR: 1.0000 | EXTENDED_RELEASE_TABLET | Freq: Every day | ORAL | 3 refills | Status: DC

## 2024-03-05 NOTE — Patient Instructions (Addendum)
 [facility] at [facility] Discharge Instructions  You were seen today by Sheril Dines PA-C for your iron deficiency anemia.      PLAN SUMMARY: IV iron (first dose) - 1:00 PM on Wednesday 03/06/2024 IV iron (second dose) - 8:15 AM on Wednesday 03/13/24 Labs - 9:45 AM on Wednesday 06/05/24 Follow-up visit - 9:30 AM on Wednesday 06/12/24     Your blood and iron levels are low, so we will schedule you for IV IRON x 2 doses.  You should also continue to follow-up with your gastroenterologist [facility] doctor) for ongoing monitoring and management of your intestinal blood loss.     You should CONTINUE taking over-the-counter vitamin B12 supplement 1000 mcg daily  You should CONTINUE drinking Ensure (or Boost) 1-2 drinks daily due to your decreased appetite and weight loss.    I will see you for follow-up visit in 3 months.  ** Thank you for trusting me with your healthcare!  I strive to provide all of my patients with quality care at each visit.  If you receive a survey for this visit, I would be so grateful to you for taking the time to provide feedback.  Thank you in advance!  ~ Devantae Babe                   Dr. Paulett Boros   &   Sheril Dines, PA-C   - - - - - - - - - - - - - - - - - -     Thank you for choosing [facility] at [facility] to provide your oncology and hematology care.  To afford each patient quality time with our provider, please arrive at least 15 minutes before your scheduled appointment time.   If you have a lab appointment with the Cancer Center please come in thru the Main Entrance and check in at the main information desk.  You need to re-schedule your appointment should you arrive 10 or more minutes late.  We strive to give you quality time with our providers, and arriving late affects you and other patients whose appointments are after yours.  Also, if you no show three or more times for appointments you  may be dismissed from the clinic at the providers discretion.     Again, thank you for choosing [facility].  Our hope is that these requests will decrease the amount of time that you wait before being seen by our physicians.       _____________________________________________________________  Should you have questions after your visit to [facility], please contact our office at [phone] and follow the prompts.  Our office hours are 8:00 a.m. and 4:30 p.m. Monday - Friday.  Please note that voicemails left after 4:00 p.m. may not be returned until the following business day.  We are closed weekends and major holidays.  You do have access to a nurse 24-7, just call the main number to the clinic [phone] and do not press any options, hold on the line and a nurse will answer the phone.    For prescription refill requests, have your pharmacy contact our office and allow 72 hours.    Due to Covid, you will need to wear a mask upon entering the hospital. If you do not have a mask, a mask will be given to you at the Main Entrance upon arrival. For doctor visits, patients may have 1 support person age  18 or older with them. For treatment visits, patients can not have anyone with them due to social distancing guidelines and our immunocompromised population.

## 2024-03-06 ENCOUNTER — Inpatient Hospital Stay

## 2024-03-06 VITALS — BP 115/74 | HR 61 | Temp 97.2°F | Resp 18

## 2024-03-06 DIAGNOSIS — D508 Other iron deficiency anemias: Secondary | ICD-10-CM | POA: Diagnosis not present

## 2024-03-06 DIAGNOSIS — D500 Iron deficiency anemia secondary to blood loss (chronic): Secondary | ICD-10-CM

## 2024-03-06 MED ORDER — CETIRIZINE HCL 10 MG PO TABS
10.0000 mg | ORAL_TABLET | Freq: Once | ORAL | Status: AC
  Administered 2024-03-06: 10 mg via ORAL
  Filled 2024-03-06: qty 1

## 2024-03-06 MED ORDER — SODIUM CHLORIDE 0.9 % IV SOLN
510.0000 mg | Freq: Once | INTRAVENOUS | Status: AC
  Administered 2024-03-06: 510 mg via INTRAVENOUS
  Filled 2024-03-06: qty 510

## 2024-03-06 MED ORDER — SODIUM CHLORIDE 0.9 % IV SOLN: INTRAVENOUS | Status: AC

## 2024-03-06 MED ORDER — ACETAMINOPHEN 325 MG PO TABS
650.0000 mg | ORAL_TABLET | Freq: Once | ORAL | Status: AC
  Administered 2024-03-06: 650 mg via ORAL
  Filled 2024-03-06: qty 2

## 2024-03-10 ENCOUNTER — Observation Stay (HOSPITAL_COMMUNITY)
Admission: EM | Admit: 2024-03-10 | Discharge: 2024-03-11 | Disposition: A | Discharge status: Home / Self Care | Attending: Emergency Medicine | Admitting: Emergency Medicine

## 2024-03-10 ENCOUNTER — Other Ambulatory Visit: Payer: Self-pay

## 2024-03-10 ENCOUNTER — Emergency Department (HOSPITAL_COMMUNITY)
Admission: EM | Admit: 2024-03-10 | Discharge: 2024-03-10 | Disposition: A | Discharge status: Home / Self Care | Source: Home / Self Care | Attending: Emergency Medicine | Admitting: Emergency Medicine

## 2024-03-10 ENCOUNTER — Encounter (HOSPITAL_COMMUNITY): Payer: Self-pay

## 2024-03-10 ENCOUNTER — Emergency Department (HOSPITAL_COMMUNITY)

## 2024-03-10 DIAGNOSIS — R42 Dizziness and giddiness: Secondary | ICD-10-CM

## 2024-03-10 DIAGNOSIS — Z79899 Other long term (current) drug therapy: Secondary | ICD-10-CM | POA: Insufficient documentation

## 2024-03-10 DIAGNOSIS — Z7982 Long term (current) use of aspirin: Secondary | ICD-10-CM | POA: Diagnosis not present

## 2024-03-10 DIAGNOSIS — F32A Depression, unspecified: Secondary | ICD-10-CM | POA: Diagnosis not present

## 2024-03-10 DIAGNOSIS — K219 Gastro-esophageal reflux disease without esophagitis: Secondary | ICD-10-CM | POA: Diagnosis not present

## 2024-03-10 DIAGNOSIS — Z8673 Personal history of transient ischemic attack (TIA), and cerebral infarction without residual deficits: Secondary | ICD-10-CM | POA: Diagnosis not present

## 2024-03-10 DIAGNOSIS — R102 Pelvic and perineal pain: Secondary | ICD-10-CM | POA: Insufficient documentation

## 2024-03-10 DIAGNOSIS — R110 Nausea: Secondary | ICD-10-CM | POA: Insufficient documentation

## 2024-03-10 DIAGNOSIS — E861 Hypovolemia: Secondary | ICD-10-CM | POA: Insufficient documentation

## 2024-03-10 DIAGNOSIS — I10 Essential (primary) hypertension: Secondary | ICD-10-CM | POA: Insufficient documentation

## 2024-03-10 DIAGNOSIS — Z91040 Latex allergy status: Secondary | ICD-10-CM | POA: Insufficient documentation

## 2024-03-10 DIAGNOSIS — E876 Hypokalemia: Secondary | ICD-10-CM | POA: Diagnosis present

## 2024-03-10 DIAGNOSIS — R55 Syncope and collapse: Secondary | ICD-10-CM | POA: Diagnosis not present

## 2024-03-10 DIAGNOSIS — E785 Hyperlipidemia, unspecified: Secondary | ICD-10-CM | POA: Insufficient documentation

## 2024-03-10 DIAGNOSIS — R109 Unspecified abdominal pain: Secondary | ICD-10-CM

## 2024-03-10 DIAGNOSIS — R1032 Left lower quadrant pain: Secondary | ICD-10-CM | POA: Insufficient documentation

## 2024-03-10 DIAGNOSIS — D509 Iron deficiency anemia, unspecified: Secondary | ICD-10-CM | POA: Diagnosis not present

## 2024-03-10 DIAGNOSIS — Z87891 Personal history of nicotine dependence: Secondary | ICD-10-CM | POA: Diagnosis not present

## 2024-03-10 LAB — URINALYSIS, ROUTINE W REFLEX MICROSCOPIC
Bacteria, UA: NONE SEEN
Bilirubin Urine: NEGATIVE
Glucose, UA: NEGATIVE mg/dL
Hgb urine dipstick: NEGATIVE
Ketones, ur: NEGATIVE mg/dL
Nitrite: NEGATIVE
Protein, ur: NEGATIVE mg/dL
Specific Gravity, Urine: 1.008 (ref 1.005–1.030)
pH: 6 (ref 5.0–8.0)

## 2024-03-10 LAB — CBC WITH DIFFERENTIAL/PLATELET
Abs Immature Granulocytes: 0.01 10*3/uL (ref 0.00–0.07)
Basophils Absolute: 0 10*3/uL (ref 0.0–0.1)
Basophils Relative: 0 %
Eosinophils Absolute: 0.1 10*3/uL (ref 0.0–0.5)
Eosinophils Relative: 1 %
HCT: 38.5 % (ref 36.0–46.0)
Hemoglobin: 12.1 g/dL (ref 12.0–15.0)
Immature Granulocytes: 0 %
Lymphocytes Relative: 11 %
Lymphs Abs: 0.7 10*3/uL (ref 0.7–4.0)
MCH: 29.7 pg (ref 26.0–34.0)
MCHC: 31.4 g/dL (ref 30.0–36.0)
MCV: 94.4 fL (ref 80.0–100.0)
Monocytes Absolute: 0.5 10*3/uL (ref 0.1–1.0)
Monocytes Relative: 8 %
Neutro Abs: 4.8 10*3/uL (ref 1.7–7.7)
Neutrophils Relative %: 80 %
Platelets: 302 10*3/uL (ref 150–400)
RBC: 4.08 MIL/uL (ref 3.87–5.11)
RDW: 14.4 % (ref 11.5–15.5)
WBC: 6 10*3/uL (ref 4.0–10.5)
nRBC: 0 % (ref 0.0–0.2)

## 2024-03-10 LAB — TSH: TSH: 2.633 u[IU]/mL (ref 0.350–4.500)

## 2024-03-10 LAB — COMPREHENSIVE METABOLIC PANEL WITH GFR
ALT: 11 U/L (ref 0–44)
AST: 17 U/L (ref 15–41)
Albumin: 3.7 g/dL (ref 3.5–5.0)
Alkaline Phosphatase: 69 U/L (ref 38–126)
Anion gap: 10 (ref 5–15)
BUN: 9 mg/dL (ref 8–23)
CO2: 25 mmol/L (ref 22–32)
Calcium: 9.3 mg/dL (ref 8.9–10.3)
Chloride: 104 mmol/L (ref 98–111)
Creatinine, Ser: 0.79 mg/dL (ref 0.44–1.00)
GFR, Estimated: >60 mL/min (ref >60)
Glucose, Bld: 84 mg/dL (ref 70–99)
Potassium: 3.4 mmol/L — ABNORMAL LOW (ref 3.5–5.1)
Sodium: 139 mmol/L (ref 135–145)
Total Bilirubin: 0.4 mg/dL (ref 0.0–1.2)
Total Protein: 7 g/dL (ref 6.5–8.1)

## 2024-03-10 LAB — PHOSPHORUS: Phosphorus: 3.2 mg/dL (ref 2.5–4.6)

## 2024-03-10 LAB — TROPONIN I (HIGH SENSITIVITY)
Troponin I (High Sensitivity): 5 ng/L (ref <18)
Troponin I (High Sensitivity): 11 ng/L (ref <18)

## 2024-03-10 LAB — LIPASE, BLOOD: Lipase: 66 U/L — ABNORMAL HIGH (ref 11–51)

## 2024-03-10 LAB — MAGNESIUM: Magnesium: 1.8 mg/dL (ref 1.7–2.4)

## 2024-03-10 MED ORDER — EZETIMIBE 10 MG PO TABS
10.0000 mg | ORAL_TABLET | Freq: Every day | ORAL | Status: DC
  Administered 2024-03-11: 10 mg via ORAL
  Filled 2024-03-10: qty 1

## 2024-03-10 MED ORDER — ACETAMINOPHEN 325 MG PO TABS: 650.0000 mg | ORAL_TABLET | Freq: Four times a day (QID) | ORAL | Status: DC | PRN

## 2024-03-10 MED ORDER — ONDANSETRON 4 MG PO TBDP: 4.0000 mg | ORAL_TABLET | Freq: Three times a day (TID) | ORAL | 0 refills | Status: DC | PRN

## 2024-03-10 MED ORDER — MIRTAZAPINE 15 MG PO TABS
30.0000 mg | ORAL_TABLET | Freq: Every day | ORAL | Status: DC
  Administered 2024-03-10: 30 mg via ORAL
  Filled 2024-03-10: qty 2

## 2024-03-10 MED ORDER — SENNOSIDES-DOCUSATE SODIUM 8.6-50 MG PO TABS: 1.0000 | ORAL_TABLET | Freq: Every evening | ORAL | Status: DC | PRN

## 2024-03-10 MED ORDER — ENOXAPARIN SODIUM 40 MG/0.4ML IJ SOSY: 40.0000 mg | PREFILLED_SYRINGE | INTRAMUSCULAR | Status: DC

## 2024-03-10 MED ORDER — POTASSIUM CHLORIDE CRYS ER 20 MEQ PO TBCR
20.0000 meq | EXTENDED_RELEASE_TABLET | Freq: Every day | ORAL | Status: DC
  Administered 2024-03-11: 20 meq via ORAL
  Filled 2024-03-10: qty 1

## 2024-03-10 MED ORDER — IOHEXOL 300 MG/ML  SOLN
100.0000 mL | Freq: Once | INTRAMUSCULAR | Status: AC | PRN
  Administered 2024-03-10: 100 mL via INTRAVENOUS

## 2024-03-10 MED ORDER — ESCITALOPRAM OXALATE 10 MG PO TABS
10.0000 mg | ORAL_TABLET | Freq: Every day | ORAL | Status: DC
  Administered 2024-03-11: 10 mg via ORAL
  Filled 2024-03-10: qty 1

## 2024-03-10 MED ORDER — LACTATED RINGERS IV BOLUS
1000.0000 mL | Freq: Once | INTRAVENOUS | Status: AC
  Administered 2024-03-10: 1000 mL via INTRAVENOUS

## 2024-03-10 MED ORDER — PANTOPRAZOLE SODIUM 40 MG PO TBEC
40.0000 mg | DELAYED_RELEASE_TABLET | Freq: Two times a day (BID) | ORAL | Status: DC
  Administered 2024-03-10 – 2024-03-11 (×2): 40 mg via ORAL
  Filled 2024-03-10 (×2): qty 1

## 2024-03-10 MED ORDER — ATORVASTATIN CALCIUM 40 MG PO TABS
40.0000 mg | ORAL_TABLET | Freq: Every day | ORAL | Status: DC
Start: 2024-03-11 — End: 2024-03-11
  Administered 2024-03-11: 40 mg via ORAL
  Filled 2024-03-10: qty 1

## 2024-03-10 MED ORDER — ONDANSETRON HCL 4 MG PO TABS: 4.0000 mg | ORAL_TABLET | Freq: Four times a day (QID) | ORAL | Status: DC | PRN

## 2024-03-10 MED ORDER — VITAMIN B-12 1000 MCG PO TABS
1000.0000 ug | ORAL_TABLET | Freq: Every day | ORAL | Status: DC
  Administered 2024-03-11: 1000 ug via ORAL
  Filled 2024-03-10: qty 1

## 2024-03-10 MED ORDER — ONDANSETRON HCL 4 MG/2ML IJ SOLN: 4.0000 mg | Freq: Four times a day (QID) | INTRAMUSCULAR | Status: DC | PRN

## 2024-03-10 MED ORDER — ACETAMINOPHEN 650 MG RE SUPP: 650.0000 mg | Freq: Four times a day (QID) | RECTAL | Status: DC | PRN

## 2024-03-10 MED ORDER — ONDANSETRON HCL 4 MG/2ML IJ SOLN
4.0000 mg | Freq: Once | INTRAMUSCULAR | Status: AC
  Administered 2024-03-10: 4 mg via INTRAVENOUS
  Filled 2024-03-10: qty 2

## 2024-03-10 MED ORDER — SODIUM CHLORIDE 0.9 % IV SOLN: INTRAVENOUS | Status: DC

## 2024-03-10 MED ORDER — MORPHINE SULFATE (PF) 2 MG/ML IV SOLN
2.0000 mg | Freq: Once | INTRAVENOUS | Status: AC
  Administered 2024-03-10: 2 mg via INTRAVENOUS
  Filled 2024-03-10: qty 1

## 2024-03-10 MED ORDER — ENSURE ENLIVE PO LIQD
237.0000 mL | Freq: Two times a day (BID) | ORAL | Status: DC
  Administered 2024-03-11: 237 mL via ORAL

## 2024-03-10 NOTE — ED Provider Notes (Signed)
 [place] EMERGENCY DEPARTMENT AT [facility] Provider Note   CSN: [id number] Arrival date & time: 03/10/24  1412     History  Chief Complaint  Patient presents with   Loss of Consciousness    Melody Mitchell is a 83 y.o. female.  83 year old female with history of small bowel AVM status post APC, hiatal hernia, hypertension, CVA, and iron deficiency anemia who presents emergency department with syncopal event.  Patient was in the waiting room waiting her ride when she said that she felt lightheaded.  Security saw her and sat her down into the wheelchair.  They went to do something and reported that she started to slide out of the wheelchair.  No head strike.  Patient denies any chest pain, shortness of breath, or palpitations.  Does not feel like she injured anything.       Home Medications Prior to Admission medications   Medication Sig Start Date End Date Taking? Authorizing Provider  acetaminophen  (TYLENOL ) 500 MG tablet Take 1,000 mg by mouth every 6 (six) hours as needed for moderate pain.    [provider]  amitriptyline  (ELAVIL ) 25 MG tablet Take 25 mg by mouth at bedtime.    [provider]  aspirin  EC 325 MG tablet Take 325 mg by mouth daily. 06/21/22   [provider]  atorvastatin  (LIPITOR) 40 MG tablet Take 40 mg by mouth daily. 08/10/22   [provider]  Cyanocobalamin  (VITAMIN B-12 CR) 1000 MCG TBCR Take 1 tablet (1,000 mcg total) by mouth daily. 03/05/24   Sonnie Dusky, PA-C  escitalopram  (LEXAPRO ) 10 MG tablet Take 10 mg by mouth daily. 08/10/22   [provider]  ezetimibe  (ZETIA ) 10 MG tablet Take 10 mg by mouth daily. 08/10/22   [provider]  mirtazapine (REMERON SOL-TAB) 30 MG disintegrating tablet Take 30 mg by mouth at bedtime. 02/13/24   [provider]  omega-3 fish oil (MAXEPA) 1000 MG CAPS capsule Take 1 capsule by mouth daily. 07/14/22   [provider]  ondansetron   (ZOFRAN -ODT) 4 MG disintegrating tablet Take 1 tablet (4 mg total) by mouth every 8 (eight) hours as needed. 03/10/24   Ninetta Basket, MD  pantoprazole  (PROTONIX ) 40 MG tablet Take 40 mg by mouth 2 (two) times daily. 08/10/22   [provider]  potassium chloride  (MICRO-K ) 10 MEQ CR capsule Take 10 mEq by mouth 2 (two) times daily. 01/29/24   [provider]  potassium chloride  SA (KLOR-CON  M) 20 MEQ tablet Take 20 mEq by mouth daily. 08/10/22   [provider]      Allergies    Latex, Sulfa antibiotics, Other, and Pravastatin sodium    Review of Systems   Review of Systems  Physical Exam Updated Vital Signs BP 122/72   Pulse 77   Temp 98.4 F (36.9 C) (Oral)   Resp 20   Ht 5\' 4"  (1.626 m)   Wt 58 kg   SpO2 98%   BMI 21.95 kg/m  Physical Exam Vitals and nursing note reviewed.  Constitutional:      General: She is not in acute distress.    Appearance: She is well-developed.  HENT:     Head: Normocephalic and atraumatic.     Right Ear: External ear normal.     Left Ear: External ear normal.     Nose: Nose normal.  Eyes:     Extraocular Movements: Extraocular movements intact.     Conjunctiva/sclera: Conjunctivae normal.  Pupils: Pupils are equal, round, and reactive to light.  Neck:     Comments: No C-spine midline tenderness palpation. Cardiovascular:     Rate and Rhythm: Normal rate and regular rhythm.     Heart sounds: No murmur heard. Pulmonary:     Effort: Pulmonary effort is normal. No respiratory distress.     Breath sounds: Normal breath sounds.  Musculoskeletal:        General: No deformity.     Cervical back: Normal range of motion and neck supple.     Comments: No tenderness palpation of bilateral hips, knees, or ankles.  Skin:    General: Skin is warm and dry.  Neurological:     Mental Status: She is alert and oriented to person, place, and time. Mental status is at baseline.  Psychiatric:        Mood and Affect: Mood  normal.     ED Results / Procedures / Treatments   Labs (all labs ordered are listed, but only abnormal results are displayed) Labs Reviewed  TROPONIN I (HIGH SENSITIVITY)  TROPONIN I (HIGH SENSITIVITY)    EKG None  Radiology DG Chest Portable 1 View Result Date: 03/10/2024 CLINICAL DATA:  Syncope EXAM: PORTABLE CHEST 1 VIEW COMPARISON:  10/29/2022 FINDINGS: Numerous leads and wires project over the chest. Midline trachea. Mild cardiomegaly. Tortuous thoracic aorta. Right paratracheal density is relatively similar back to at least 11/13/2021, likely due to prominent great vessels. No pleural effusion or pneumothorax. No congestive failure. Clear lungs. IMPRESSION: No acute process or explanation for syncope. Cardiomegaly without congestive failure. Electronically Signed   By: Lore Rode M.D.   On: 03/10/2024 16:46   CT ABDOMEN PELVIS W CONTRAST Result Date: 03/10/2024 CLINICAL DATA:  Left lower quadrant abdominal pain. Elevated lipase. EXAM: CT ABDOMEN AND PELVIS WITH CONTRAST TECHNIQUE: Multidetector CT imaging of the abdomen and pelvis was performed using the standard protocol following bolus administration of intravenous contrast. RADIATION DOSE REDUCTION: This exam was performed according to the departmental dose-optimization program which includes automated exposure control, adjustment of the mA and/or kV according to patient size and/or use of iterative reconstruction technique. CONTRAST:  OMNIPAQUE  IOHEXOL  300 MG/ML  SOLN COMPARISON:  CT abdomen and pelvis with contrast 12/29/2022 FINDINGS: Lower chest: Lung bases are clear. Heart size is normal. No significant pleural or pericardial effusion is present. Hepatobiliary: Progressive intra and extrahepatic biliary dilation is present. The common bile duct measures up to 10 mm. Cholecystectomy noted. No focal hepatic lesions are present. Pancreas: Progressive dilation of the pancreatic duct is present. No obstructing mass is present at  the pancreatic head. No focal inflammation or other mass lesion is present. Spleen: Normal in size without focal abnormality. Adrenals/Urinary Tract: The adrenal glands are normal bilaterally. Kidneys are unremarkable. No stone or mass lesion is present. Ureters are normal. The urinary bladder is within normal limits. Stomach/Bowel: The stomach and duodenum are within normal limits. Small bowel is unremarkable. Terminal ileum is within normal limits. Appendix is absent. The ascending and transverse colon are within normal limits. The descending and sigmoid colon are normal. Vascular/Lymphatic: Atherosclerotic calcifications are present in the aorta and branch vessels. No aneurysm is present. No significant adenopathy is present. Reproductive: Uterus and bilateral adnexa are unremarkable. Other: No abdominal wall hernia or abnormality. No abdominopelvic ascites. Musculoskeletal: Vacuum disc is present at L5-S1. Endplate degenerative changes are thoracic spine T9-10 T11-12. Vertebral body heights are maintained. Alignment is anatomic. The bony pelvis is normal. The hips are located  and normal. IMPRESSION: 1. Progressive intra and extrahepatic biliary dilation and pancreatic duct dilation. No obstructing mass is present at the pancreatic head. 2. No other acute or focal lesion to explain the patient's symptoms. 3. Status post cholecystectomy and appendectomy. 4. Degenerative changes of the thoracic and lumbar spine. Electronically Signed   By: Audree Leas M.D.   On: 03/10/2024 11:17    Procedures Procedures    Medications Ordered in ED Medications  lactated ringers  bolus 1,000 mL (0 mLs Intravenous Stopped 03/10/24 1625)    ED Course/ Medical Decision Making/ A&P Clinical Course as of 03/10/24 1747  Sun Mar 10, 2024  1530 Signed out to Dr Scarlette Currier [RP]    Clinical Course User Index [RP] Ninetta Basket, MD                                 Medical Decision Making Amount and/or Complexity  of Data Reviewed Radiology: ordered.  Risk Decision regarding hospitalization.   JAXSON LEHAN is a 83 y.o. female with comorbidities that complicate the patient evaluation including small bowel AVM status post APC, hiatal hernia, hypertension, CVA, and iron deficiency anemia who presents emergency department with syncopal event.   Initial Ddx:  Orthostasis, hypovolemia, shock, arrhythmia  MDM/Course:  Patient was set to be discharged when she was in the waiting room and security reported that she was lightheaded.  They sat her down and she had a syncopal event.  Went to the ground but did not sustain any significant trauma.  On my exam is not complaining of pain anywhere as a result of the fall.  No head strike.  No external signs of head trauma.  Says that she is still feeling lightheaded.  Given some IV fluids.  Initial blood pressure was soft in the 90s systolic but has improved upon re-evaluation.  Getting EKG and troponin to assess for any sort of cardiac cause of her syncope.  Signed out to the oncoming physician awaiting the results of her lab work.  Suspect that she may need admission for observation.  This patient presents to the ED for concern of complaints listed in HPI, this involves an extensive number of treatment options, and is a complaint that carries with it a high risk of complications and morbidity. Disposition including potential need for admission considered.   Dispo: Pending remainder of workup  Additional history obtained from  security Records reviewed ED Visit Notes I personally reviewed and interpreted cardiac monitoring: normal sinus rhythm  I personally reviewed and interpreted the pt's EKG: see above for interpretation  I have reviewed the patients home medications and made adjustments as needed Social Determinants of health:  Geriatric  Portions of this note were generated with Scientist, clinical (histocompatibility and immunogenetics). Dictation errors may occur despite best attempts at  proofreading.     Final Clinical Impression(s) / ED Diagnoses Final diagnoses:  Syncope, unspecified syncope type    Rx / DC Orders ED Discharge Orders     None         Ninetta Basket, MD 03/10/24 1747

## 2024-03-10 NOTE — ED Triage Notes (Signed)
 Pt recently dc from here today and was waiting for her ride in waiting room when she passed out. Pt stated that she believes she was released too early.

## 2024-03-10 NOTE — H&P (Signed)
 History and Physical  Melody Mitchell:[id number] DOB: 1941-06-11 DOA: 03/10/2024  PCP: [facility]   Chief Complaint: Syncope  HPI: Melody Mitchell is a 83 y.o. female with medical history significant for iron deficiency anemia, GERD, HTN, arthritis small bowel AVM, HLD, CVA and depression who was initially evaluated in the ED for abdominal pain patient was discharged home from the ED to follow-up with PCP after workup with abdominal imaging and labs were unremarkable and patient was able to tolerate p.o. intake. While waiting for her ride in the ED, patient informed security that she felt lightheaded. Patient was sat in the wheelchair but later, security noticed that she started sliding out of the wheelchair. She did not lose consciousness and did not hit her head. On evaluation, patient was eating her dinner. She reports that she felt very lightheaded while in the ED. She thinks she has not been drinking enough water .She denies any abdominal pain, chest pain, shortness of breath, chest pain, palpitations, nausea, vomiting, headache or vision changes.  No recent hematuria, bloody stools, falls or syncope.  ED Course: Vitals were notable for temp 98.2, RR 17, HR 67, BP 105/71, SpO2 100% on room air. Labs overall unremarkable with normal LFTs and kidney function, K+ 3.4, lipase 66(from 74 1 year ago), normal CBC, UA with no signs of infection, normal troponins x 2.  EKG showed normal sinus rhythm rate of 77 and artifact in multiple leads. CXR with cardiomegaly but no acute cardiopulmonary disease.  Patient was given IV LR 1 L bolus and TRH was consulted for admission.  Review of Systems: Please see HPI for pertinent positives and negatives. A complete 10 system review of systems are otherwise negative.  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   ANTEGRADE BALLOON ENTEROSCOPY W/  APC IN 2011 [facility]   Bronchitis january 2014   Hiatal hernia    High cholesterol    History of contact dermatitis    Hx: recurrent pneumonia    Hypertension    IDA (iron deficiency anemia)    chronic iron infusions   Reflux    Vertigo    Past Surgical History:  Procedure Laterality Date   ABDOMINAL HYSTERECTOMY     APPENDECTOMY     BILATERAL BREAST BIOPSIES  BENIGN     CHOLECYSTECTOMY     COLONOSCOPY N/A 06/29/2015   SLF: The left colon is redundant 2. four colon polyps removed. No source  for change in bowel habits identified 3. Rectal bleeding due to large internal hemorrhoids    COLONOSCOPY W/ BIOPSIES  6 2010   Dr. Nolene Baumgarten: Polypoid appearing lesion of the ascending colon, 3 mm sessile rectal polyp, small internal hemorrhoids. Pathology revealed polypoid mucosa, no adenomatous changes   Double-balloon enteroscopy, antegrade  April 2011   Dr. Hercules Lombard: Multiple duodenal and jejunal angiectasia is treated with APC.   ENTEROSCOPY N/A 06/02/2022   Procedure: ENTEROSCOPY;  Surgeon: Suzette Espy, MD;  Location: [facility];  Service: Endoscopy;  Laterality: N/A;  with pediatric colonoscope   ESOPHAGOGASTRODUODENOSCOPY  04/2009   Dr. Nolene Baumgarten: Patent Schatzki ring dilated with advancing the scope, patchy erythema in the antrum, 2 small AVMs in the duodenal bulb, 2 additional AVMs noted in the second portion the duodenum, mild gastritis on pathology   ESOPHAGOGASTRODUODENOSCOPY N/A 06/29/2015   SLF: 1. 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  small hiatal hernia 3. Mild non-erosive gastririts- NO obvious source for dyspepsia identified.    ESOPHAGOGASTRODUODENOSCOPY N/A 03/13/2020   Fields: Low-grade narrowing Schatzki ring, small hiatal hernia, multiple nonbleeding angiectasia's in the jejunum treated with APC.  Spot tattoo in jejunum at 90 cm from the incisors.   ESOPHAGOGASTRODUODENOSCOPY N/A 06/02/2022   Procedure: ESOPHAGOGASTRODUODENOSCOPY (EGD);  Surgeon: Suzette Espy, MD;   Location: [facility];  Service: Endoscopy;  Laterality: N/A;   EYE SURGERY     HOT HEMOSTASIS  06/02/2022   Procedure: HOT HEMOSTASIS (ARGON PLASMA COAGULATION/BICAP);  Surgeon: Suzette Espy, MD;  Location: [facility];  Service: Endoscopy;;   Social History:  reports that she quit smoking about 16 years ago. Her smoking use included cigarettes. She started smoking about 41 years ago. She has a 25 pack-year smoking history. She has never used smokeless tobacco. She reports that she does not currently use alcohol . She reports that she does not use drugs.  Allergies  Allergen Reactions   Latex Hives, Itching and Rash   Sulfa Antibiotics Hives and Itching    Other reaction(s): hives   Other Hives   Pravastatin Sodium Other (See Comments)    Family History  Problem Relation Age of Onset   Heart failure Mother    Diabetes Sister    Heart attack Father    Colon cancer Neg Hx    Gastric cancer Neg Hx    Esophageal cancer Neg Hx      Prior to Admission medications   Medication Sig Start Date End Date Taking? Authorizing Provider  acetaminophen  (TYLENOL ) 500 MG tablet Take 1,000 mg by mouth every 6 (six) hours as needed for moderate pain.    [provider]  amitriptyline  (ELAVIL ) 25 MG tablet Take 25 mg by mouth at bedtime.    [provider]  aspirin  EC 325 MG tablet Take 325 mg by mouth daily. 06/21/22   [provider]  atorvastatin  (LIPITOR) 40 MG tablet Take 40 mg by mouth daily. 08/10/22   [provider]  Cyanocobalamin  (VITAMIN B-12 CR) 1000 MCG TBCR Take 1 tablet (1,000 mcg total) by mouth daily. 03/05/24   Sonnie Dusky, PA-C  escitalopram  (LEXAPRO ) 10 MG tablet Take 10 mg by mouth daily. 08/10/22   [provider]  ezetimibe  (ZETIA ) 10 MG tablet Take 10 mg by mouth daily. 08/10/22   [provider]  mirtazapine (REMERON SOL-TAB) 30 MG disintegrating tablet Take 30 mg by mouth at bedtime. 02/13/24   [provider]  omega-3 fish oil (MAXEPA) 1000 MG CAPS capsule Take 1 capsule by mouth daily. 07/14/22   [provider]  ondansetron  (ZOFRAN -ODT) 4 MG disintegrating tablet Take 1 tablet (4 mg total) by mouth every 8 (eight) hours as needed. 03/10/24   Ninetta Basket, MD  pantoprazole  (PROTONIX ) 40 MG tablet Take 40 mg by mouth 2 (two) times daily. 08/10/22   [provider]  potassium chloride  (MICRO-K ) 10 MEQ CR capsule Take 10 mEq by mouth 2 (two) times daily. 01/29/24   [provider]  potassium chloride  SA (KLOR-CON  M) 20 MEQ tablet Take 20 mEq by mouth daily. 08/10/22   [provider]    Physical Exam: BP 134/76 (BP Location: Left Arm)   Pulse 62   Temp 98 F (36.7 C) (Oral)   Resp 20   Ht 5\' 4"  (1.626 m)   Wt 58 kg   SpO2 98%   BMI 21.95 kg/m  General: Pleasant, well-appearing  elderly woman sitting in bed eating. No acute distress. HEENT: [place]/AT. Anicteric sclera CV: RRR. No murmurs, rubs, or gallops. No LE edema Pulmonary: Lungs CTAB. Normal effort. No wheezing or rales. Abdominal: Soft, nontender, nondistended. Normal bowel sounds. Extremities: Palpable radial and DP pulses. Normal ROM. Skin: Warm and dry. No obvious rash or lesions. Dry mucous membrane. Neuro: A&Ox3. Moves all extremities. Normal sensation to light touch. No focal deficit. Psych: Normal mood and affect          Labs on Admission:  Basic Metabolic Panel: Recent Labs  Lab 03/10/24 0942  NA 139  K 3.4*  CL 104  CO2 25  GLUCOSE 84  BUN 9  CREATININE 0.79  CALCIUM  9.3   Liver Function Tests: Recent Labs  Lab 03/10/24 0942  AST 17  ALT 11  ALKPHOS 69  BILITOT 0.4  PROT 7.0  ALBUMIN 3.7   Recent Labs  Lab 03/10/24 0942  LIPASE 66*   No results for input(s): "AMMONIA" in the last 168 hours. CBC: Recent Labs  Lab 03/10/24 0942  WBC 6.0  NEUTROABS 4.8  HGB 12.1  HCT 38.5  MCV 94.4  PLT 302   Cardiac Enzymes: No results for input(s):  "CKTOTAL", "CKMB", "CKMBINDEX", "TROPONINI" in the last 168 hours. BNP (last 3 results) No results for input(s): "BNP" in the last 8760 hours.  ProBNP (last 3 results) No results for input(s): "PROBNP" in the last 8760 hours.  CBG: No results for input(s): "GLUCAP" in the last 168 hours.  Radiological Exams on Admission: DG Chest Portable 1 View Result Date: 03/10/2024 CLINICAL DATA:  Syncope EXAM: PORTABLE CHEST 1 VIEW COMPARISON:  10/29/2022 FINDINGS: Numerous leads and wires project over the chest. Midline trachea. Mild cardiomegaly. Tortuous thoracic aorta. Right paratracheal density is relatively similar back to at least 11/13/2021, likely due to prominent great vessels. No pleural effusion or pneumothorax. No congestive failure. Clear lungs. IMPRESSION: No acute process or explanation for syncope. Cardiomegaly without congestive failure. Electronically Signed   By: Lore Rode M.D.   On: 03/10/2024 16:46   CT ABDOMEN PELVIS W CONTRAST Result Date: 03/10/2024 CLINICAL DATA:  Left lower quadrant abdominal pain. Elevated lipase. EXAM: CT ABDOMEN AND PELVIS WITH CONTRAST TECHNIQUE: Multidetector CT imaging of the abdomen and pelvis was performed using the standard protocol following bolus administration of intravenous contrast. RADIATION DOSE REDUCTION: This exam was performed according to the departmental dose-optimization program which includes automated exposure control, adjustment of the mA and/or kV according to patient size and/or use of iterative reconstruction technique. CONTRAST:  OMNIPAQUE  IOHEXOL  300 MG/ML  SOLN COMPARISON:  CT abdomen and pelvis with contrast 12/29/2022 FINDINGS: Lower chest: Lung bases are clear. Heart size is normal. No significant pleural or pericardial effusion is present. Hepatobiliary: Progressive intra and extrahepatic biliary dilation is present. The common bile duct measures up to 10 mm. Cholecystectomy noted. No focal hepatic lesions are present.  Pancreas: Progressive dilation of the pancreatic duct is present. No obstructing mass is present at the pancreatic head. No focal inflammation or other mass lesion is present. Spleen: Normal in size without focal abnormality. Adrenals/Urinary Tract: The adrenal glands are normal bilaterally. Kidneys are unremarkable. No stone or mass lesion is present. Ureters are normal. The urinary bladder is within normal limits. Stomach/Bowel: The stomach and duodenum are within normal limits. Small bowel is unremarkable. Terminal ileum is within normal limits. Appendix is absent. The ascending and transverse colon are within normal limits. The descending and sigmoid colon are normal. Vascular/Lymphatic:  Atherosclerotic calcifications are present in the aorta and branch vessels. No aneurysm is present. No significant adenopathy is present. Reproductive: Uterus and bilateral adnexa are unremarkable. Other: No abdominal wall hernia or abnormality. No abdominopelvic ascites. Musculoskeletal: Vacuum disc is present at L5-S1. Endplate degenerative changes are thoracic spine T9-10 T11-12. Vertebral body heights are maintained. Alignment is anatomic. The bony pelvis is normal. The hips are located and normal. IMPRESSION: 1. Progressive intra and extrahepatic biliary dilation and pancreatic duct dilation. No obstructing mass is present at the pancreatic head. 2. No other acute or focal lesion to explain the patient's symptoms. 3. Status post cholecystectomy and appendectomy. 4. Degenerative changes of the thoracic and lumbar spine. Electronically Signed   By: Audree Leas M.D.   On: 03/10/2024 11:17   Assessment/Plan KHAIRA IHDE is a 83 y.o. female with medical history significant for  iron deficiency anemia, GERD, HTN, arthritis small bowel AVM, HLD, CVA and depression who was admitted for syncope episode.  # Syncope # Lightheadedness - Became lightheaded while in the ED and slid out of wheelchair - Found to have low  blood pressure with SBP in the 100s - Patient dry and dehydrated on exam - Mild syncope and lightheadedness secondary to dehydration and hypovolemia - Will admit on telemetry for observation - Start IV NS at 100 cc/h for 10 hours - Check orthostatic vitals - Check TSH, mag and phos - Fall precautions  # Iron deficiency anemia - Followed by oncology in the outpatient - Hemoglobin stable at 12.1 (baseline around 10-11), likely mild hemoconcentration - Trend CBC  # HTN - BP improved with SBP now in the 120s to 130s - CTM  # Hypokalemia - History of hypokalemia on potassium supplementation - Continue home potassium 20 mEq daily - Follow-up morning BMP  # HLD - Continue atorvastatin  and Zetia   # Depression - Continue Lexapro  and mirtazapine  # GERD - Continue Protonix   DVT prophylaxis: Lovenox      Code Status: Full Code  Consults called: None  Family Communication: No family at bedside  Severity of Illness: The appropriate patient status for this patient is OBSERVATION. Observation status is judged to be reasonable and necessary in order to provide the required intensity of service to ensure the patient's safety. The patient's presenting symptoms, physical exam findings, and initial radiographic and laboratory data in the context of their medical condition is felt to place them at decreased risk for further clinical deterioration. Furthermore, it is anticipated that the patient will be medically stable for discharge from the hospital within 2 midnights of admission.   Level of care: Telemetry   This record has been created using Conservation officer, historic buildings. Errors have been sought and corrected, but may not always be located. Such creation errors do not reflect on the standard of care.   Vita Grip, MD 03/10/2024, 7:56 PM [facility] Pager: [phone] Isaiah 41:10   If 7PM-7AM, please contact night-coverage [email or web address] Password [email or web address]

## 2024-03-10 NOTE — ED Notes (Signed)
 ED TO INPATIENT HANDOFF REPORT  ED Nurse Name and Phone #: Autry Boas Name/Age/Gender Melody Mitchell 83 y.o. female Room/Bed: APA07/APA07  Code Status   Code Status: Full Code  Home/SNF/Other Home Patient oriented to: self, place, time, and situation Is this baseline? Yes   Triage Complete: Triage complete  Chief Complaint Syncope [R55]  Triage Note Pt recently dc from here today and was waiting for her ride in waiting room when she passed out. Pt stated that she believes she was released too early.   Allergies Allergies  Allergen Reactions   Latex Hives, Itching and Rash   Sulfa Antibiotics Hives and Itching    Other reaction(s): hives   Other Hives   Pravastatin Sodium Other (See Comments)    Level of Care/Admitting Diagnosis ED Disposition     ED Disposition  Admit   Condition  --   Comment  Hospital Area: [facility] [[id number]]  Level of Care: Telemetry [5]  Covid Evaluation: Asymptomatic - no recent exposure (last 10 days) testing not required  Diagnosis: Syncope [206001]  Admitting Physician: Vita Grip [[id number]]  Attending Physician: Vita Grip [[id number]]          B Medical/Surgery History 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 four colon polyps removed. No source  for change in bowel habits identified 3. Rectal bleeding due to  large internal hemorrhoids    COLONOSCOPY W/ BIOPSIES  6 2010   Dr. Nolene Baumgarten: Polypoid appearing lesion of the ascending colon, 3 mm sessile rectal polyp, small internal hemorrhoids. Pathology revealed polypoid mucosa, no adenomatous changes   Double-balloon enteroscopy, antegrade  April 2011   Dr. Hercules Lombard: Multiple duodenal and jejunal angiectasia is treated with APC.   ENTEROSCOPY N/A 06/02/2022   Procedure: ENTEROSCOPY;  Surgeon: Suzette Espy, MD;  [facility];  Service: Endoscopy;  Laterality: N/A;  with pediatric colonoscope   ESOPHAGOGASTRODUODENOSCOPY  04/2009   Dr. Nolene Baumgarten: Patent Schatzki ring dilated with advancing the scope, patchy erythema in the antrum, 2 small AVMs in the duodenal bulb, 2 additional AVMs noted in the second portion the duodenum, mild gastritis on pathology   ESOPHAGOGASTRODUODENOSCOPY N/A 06/29/2015   SLF: 1. 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 small hiatal hernia 3. Mild non-erosive gastririts- NO obvious source for dyspepsia identified.    ESOPHAGOGASTRODUODENOSCOPY N/A 03/13/2020   Fields: Low-grade narrowing Schatzki ring, small hiatal hernia, multiple nonbleeding angiectasia's in the jejunum treated with APC.  Spot tattoo in jejunum at 90 cm from the incisors.   ESOPHAGOGASTRODUODENOSCOPY N/A 06/02/2022   Procedure: ESOPHAGOGASTRODUODENOSCOPY (EGD);  Surgeon: Suzette Espy, MD;  Location[facility];  Service: Endoscopy;  Laterality: N/A;   EYE SURGERY     HOT HEMOSTASIS  06/02/2022   Procedure: HOT HEMOSTASIS (ARGON PLASMA COAGULATION/BICAP);  Surgeon: Suzette Espy, MD;  Location: [facility];  Service: Endoscopy;;     A IV Location/Drains/Wounds Patient Lines/Drains/Airways Status     Active Line/Drains/Airways     Name Placement date Placement time Site Days   Peripheral IV 03/10/24 20 G 1" Posterior;Right Forearm 03/10/24  0938  Forearm  less than 1   Peripheral IV 03/10/24 20 G 1" Anterior;Right Forearm 03/10/24  1442   Forearm  less than 1            Intake/Output Last 24 hours  Intake/Output Summary (Last 24 hours) at 03/10/2024 1757 Last data filed at 03/10/2024 1625 Gross per 24 hour  Intake 1000 ml  Output --  Net 1000 ml    Labs/Imaging Results for orders placed or performed during the hospital encounter of 03/10/24 (from the past 48 hours)  Troponin I (High Sensitivity)     Status: None   Collection Time: 03/10/24  2:33 PM  Result Value Ref Range   Troponin I (High Sensitivity) 5 <18 ng/L    Comment: (NOTE) Elevated high sensitivity troponin I (hsTnI) values and significant  changes across serial measurements may suggest ACS but many other  chronic and acute conditions are known to elevate hsTnI results.  Refer to the "Links" section for chest pain algorithms and additional  guidance. Performed at [facility], [address] [place], [place] [address]   Troponin I (High Sensitivity)     Status: None   Collection Time: 03/10/24  4:14 PM  Result Value Ref Range   Troponin I (High Sensitivity) 11 <18 ng/L    Comment: (NOTE) Elevated high sensitivity troponin I (hsTnI) values and significant  changes across serial measurements may suggest ACS but many other  chronic and acute conditions are known to elevate hsTnI results.  Refer to the "Links" section for chest pain algorithms and additional  guidance. Performed at [facility], [address], [place], [place] [address]    DG Chest Portable 1 View Result Date: 03/10/2024 CLINICAL DATA:  Syncope EXAM: PORTABLE CHEST 1 VIEW COMPARISON:  10/29/2022 FINDINGS: Numerous leads and wires project over the chest. Midline trachea. Mild cardiomegaly. Tortuous thoracic aorta. Right paratracheal density is relatively similar back to at least 11/13/2021, likely due to prominent great vessels. No pleural effusion or pneumothorax. No congestive failure. Clear lungs. IMPRESSION: No acute process or explanation for syncope. Cardiomegaly without  congestive failure. Electronically Signed   By: Lore Rode M.D.   On: 03/10/2024 16:46   CT ABDOMEN PELVIS W CONTRAST Result Date: 03/10/2024 CLINICAL DATA:  Left lower quadrant abdominal pain. Elevated lipase. EXAM: CT ABDOMEN AND PELVIS WITH CONTRAST TECHNIQUE: Multidetector CT imaging of the abdomen and pelvis was performed using the standard protocol following bolus administration of intravenous contrast. RADIATION DOSE REDUCTION: This exam was performed according to the departmental dose-optimization program which includes automated exposure control, adjustment of the mA and/or kV according to patient size and/or use of iterative reconstruction technique. CONTRAST:  OMNIPAQUE  IOHEXOL  300 MG/ML  SOLN COMPARISON:  CT abdomen and pelvis with contrast 12/29/2022 FINDINGS: Lower chest: Lung bases are clear. Heart size is normal. No significant pleural or pericardial effusion is present. Hepatobiliary: Progressive intra and extrahepatic biliary dilation is present. The common bile duct measures up to 10 mm. Cholecystectomy noted. No focal hepatic lesions are present. Pancreas: Progressive dilation of the pancreatic duct is present. No obstructing mass is present at the pancreatic head.  No focal inflammation or other mass lesion is present. Spleen: Normal in size without focal abnormality. Adrenals/Urinary Tract: The adrenal glands are normal bilaterally. Kidneys are unremarkable. No stone or mass lesion is present. Ureters are normal. The urinary bladder is within normal limits. Stomach/Bowel: The stomach and duodenum are within normal limits. Small bowel is unremarkable. Terminal ileum is within normal limits. Appendix is absent. The ascending and transverse colon are within normal limits. The descending and sigmoid colon are normal. Vascular/Lymphatic: Atherosclerotic calcifications are present in the aorta and branch vessels. No aneurysm is present. No significant adenopathy is present. Reproductive:  Uterus and bilateral adnexa are unremarkable. Other: No abdominal wall hernia or abnormality. No abdominopelvic ascites. Musculoskeletal: Vacuum disc is present at L5-S1. Endplate degenerative changes are thoracic spine T9-10 T11-12. Vertebral body heights are maintained. Alignment is anatomic. The bony pelvis is normal. The hips are located and normal. IMPRESSION: 1. Progressive intra and extrahepatic biliary dilation and pancreatic duct dilation. No obstructing mass is present at the pancreatic head. 2. No other acute or focal lesion to explain the patient's symptoms. 3. Status post cholecystectomy and appendectomy. 4. Degenerative changes of the thoracic and lumbar spine. Electronically Signed   By: Audree Leas M.D.   On: 03/10/2024 11:17    Pending Labs Unresulted Labs (From admission, onward)    None       Vitals/Pain Today's Vitals   03/10/24 1426 03/10/24 1430 03/10/24 1512 03/10/24 1530  BP:  120/74  122/72  Pulse:  77    Resp:  20  20  Temp:   98.4 F (36.9 C)   TempSrc:   Oral   SpO2:  98%    Weight: 58 kg     Height: 5\' 4"  (1.626 m)     PainSc: 0-No pain       Isolation Precautions No active isolations  Medications Medications  enoxaparin  (LOVENOX ) injection 40 mg (has no administration in time range)  acetaminophen  (TYLENOL ) tablet 650 mg (has no administration in time range)    Or  acetaminophen  (TYLENOL ) suppository 650 mg (has no administration in time range)  senna-docusate (Senokot-S) tablet 1 tablet (has no administration in time range)  ondansetron  (ZOFRAN ) tablet 4 mg (has no administration in time range)    Or  ondansetron  (ZOFRAN ) injection 4 mg (has no administration in time range)  lactated ringers  bolus 1,000 mL (0 mLs Intravenous Stopped 03/10/24 1625)    Mobility Walks w device     Focused Assessments Cardiac Assessment Handoff:      R Recommendations: See Admitting Provider Note  Report given to:   Additional Notes:

## 2024-03-10 NOTE — ED Provider Notes (Signed)
 [place] EMERGENCY DEPARTMENT AT [facility] Provider Note   CSN: [id number] Arrival date & time: 03/10/24  7829     History  Chief Complaint  Patient presents with   Abdominal Pain    Melody Mitchell is a 83 y.o. female.  83 year old female with a history of small bowel AVM status post APC, hiatal hernia, hypertension, CVA, and iron deficiency anemia who presents emergency department abdominal pain.  Patient reports that last night she started experiencing a pressure-like sensation in her lower abdomen when she got up to use the restroom.  Waxes and wanes.  Currently 4/10 in severity.  Nausea but no vomiting.  No fevers, dysuria, frequency, constipation, or diarrhea.  Last bowel movement was yesterday.  Has a history of abdominal hysterectomy, appendectomy, and cholecystectomy.       Home Medications Prior to Admission medications   Medication Sig Start Date End Date Taking? Authorizing Provider  acetaminophen  (TYLENOL ) 500 MG tablet Take 1,000 mg by mouth every 6 (six) hours as needed for moderate pain.    [provider]  amitriptyline  (ELAVIL ) 25 MG tablet Take 25 mg by mouth at bedtime.    [provider]  aspirin  EC 325 MG tablet Take 325 mg by mouth daily. 06/21/22   [provider]  atorvastatin  (LIPITOR) 40 MG tablet Take 40 mg by mouth daily. 08/10/22   [provider]  Cyanocobalamin  (VITAMIN B-12 CR) 1000 MCG TBCR Take 1 tablet (1,000 mcg total) by mouth daily. 03/05/24   Sonnie Dusky, PA-C  escitalopram  (LEXAPRO ) 10 MG tablet Take 10 mg by mouth daily. 08/10/22   [provider]  ezetimibe  (ZETIA ) 10 MG tablet Take 10 mg by mouth daily. 08/10/22   [provider]  mirtazapine (REMERON SOL-TAB) 30 MG disintegrating tablet Take 30 mg by mouth at bedtime. 02/13/24   [provider]  omega-3 fish oil (MAXEPA) 1000 MG CAPS capsule Take 1 capsule by mouth daily. 07/14/22   [provider]   ondansetron  (ZOFRAN -ODT) 4 MG disintegrating tablet Take 1 tablet (4 mg total) by mouth every 8 (eight) hours as needed. 03/10/24   Ninetta Basket, MD  pantoprazole  (PROTONIX ) 40 MG tablet Take 40 mg by mouth 2 (two) times daily. 08/10/22   [provider]  potassium chloride  (MICRO-K ) 10 MEQ CR capsule Take 10 mEq by mouth 2 (two) times daily. 01/29/24   [provider]  potassium chloride  SA (KLOR-CON  M) 20 MEQ tablet Take 20 mEq by mouth daily. 08/10/22   [provider]      Allergies    Latex, Sulfa antibiotics, Other, and Pravastatin sodium    Review of Systems   Review of Systems  Physical Exam Updated Vital Signs BP (!) 150/85   Pulse 78   Temp 98.3 F (36.8 C) (Oral)   Resp 18   Ht 5\' 4"  (1.626 m)   Wt 58 kg   SpO2 97%   BMI 21.95 kg/m  Physical Exam Vitals and nursing note reviewed.  Constitutional:      General: She is not in acute distress.    Appearance: She is well-developed.  HENT:     Head: Normocephalic and atraumatic.     Right Ear: External ear normal.     Left Ear: External ear normal.     Nose: Nose normal.  Eyes:     Extraocular Movements: Extraocular movements intact.     Conjunctiva/sclera: Conjunctivae normal.     Pupils: Pupils are equal,  round, and reactive to light.  Pulmonary:     Effort: Pulmonary effort is normal. No respiratory distress.  Abdominal:     General: Abdomen is flat. There is no distension.     Palpations: Abdomen is soft. There is no mass.     Tenderness: There is abdominal tenderness (Suprapubic and left lower quadrant). There is no guarding.  Musculoskeletal:     Cervical back: Normal range of motion and neck supple.     Right lower leg: No edema.     Left lower leg: No edema.  Skin:    General: Skin is warm and dry.  Neurological:     Mental Status: She is alert and oriented to person, place, and time. Mental status is at baseline.  Psychiatric:        Mood and Affect: Mood normal.      ED Results / Procedures / Treatments   Labs (all labs ordered are listed, but only abnormal results are displayed) Labs Reviewed  COMPREHENSIVE METABOLIC PANEL WITH GFR - Abnormal; Notable for the following components:      Result Value   Potassium 3.4 (*)    All other components within normal limits  LIPASE, BLOOD - Abnormal; Notable for the following components:   Lipase 66 (*)    All other components within normal limits  URINALYSIS, ROUTINE W REFLEX MICROSCOPIC - Abnormal; Notable for the following components:   Leukocytes,Ua MODERATE (*)    All other components within normal limits  CBC WITH DIFFERENTIAL/PLATELET    EKG None  Radiology DG Chest Portable 1 View Result Date: 03/10/2024 CLINICAL DATA:  Syncope EXAM: PORTABLE CHEST 1 VIEW COMPARISON:  10/29/2022 FINDINGS: Numerous leads and wires project over the chest. Midline trachea. Mild cardiomegaly. Tortuous thoracic aorta. Right paratracheal density is relatively similar back to at least 11/13/2021, likely due to prominent great vessels. No pleural effusion or pneumothorax. No congestive failure. Clear lungs. IMPRESSION: No acute process or explanation for syncope. Cardiomegaly without congestive failure. Electronically Signed   By: Lore Rode M.D.   On: 03/10/2024 16:46   CT ABDOMEN PELVIS W CONTRAST Result Date: 03/10/2024 CLINICAL DATA:  Left lower quadrant abdominal pain. Elevated lipase. EXAM: CT ABDOMEN AND PELVIS WITH CONTRAST TECHNIQUE: Multidetector CT imaging of the abdomen and pelvis was performed using the standard protocol following bolus administration of intravenous contrast. RADIATION DOSE REDUCTION: This exam was performed according to the departmental dose-optimization program which includes automated exposure control, adjustment of the mA and/or kV according to patient size and/or use of iterative reconstruction technique. CONTRAST:  OMNIPAQUE  IOHEXOL  300 MG/ML  SOLN COMPARISON:  CT abdomen and  pelvis with contrast 12/29/2022 FINDINGS: Lower chest: Lung bases are clear. Heart size is normal. No significant pleural or pericardial effusion is present. Hepatobiliary: Progressive intra and extrahepatic biliary dilation is present. The common bile duct measures up to 10 mm. Cholecystectomy noted. No focal hepatic lesions are present. Pancreas: Progressive dilation of the pancreatic duct is present. No obstructing mass is present at the pancreatic head. No focal inflammation or other mass lesion is present. Spleen: Normal in size without focal abnormality. Adrenals/Urinary Tract: The adrenal glands are normal bilaterally. Kidneys are unremarkable. No stone or mass lesion is present. Ureters are normal. The urinary bladder is within normal limits. Stomach/Bowel: The stomach and duodenum are within normal limits. Small bowel is unremarkable. Terminal ileum is within normal limits. Appendix is absent. The ascending and transverse colon are within normal limits. The descending and sigmoid colon  are normal. Vascular/Lymphatic: Atherosclerotic calcifications are present in the aorta and branch vessels. No aneurysm is present. No significant adenopathy is present. Reproductive: Uterus and bilateral adnexa are unremarkable. Other: No abdominal wall hernia or abnormality. No abdominopelvic ascites. Musculoskeletal: Vacuum disc is present at L5-S1. Endplate degenerative changes are thoracic spine T9-10 T11-12. Vertebral body heights are maintained. Alignment is anatomic. The bony pelvis is normal. The hips are located and normal. IMPRESSION: 1. Progressive intra and extrahepatic biliary dilation and pancreatic duct dilation. No obstructing mass is present at the pancreatic head. 2. No other acute or focal lesion to explain the patient's symptoms. 3. Status post cholecystectomy and appendectomy. 4. Degenerative changes of the thoracic and lumbar spine. Electronically Signed   By: Audree Leas M.D.   On: 03/10/2024  11:17    Procedures Procedures    Medications Ordered in ED Medications  ondansetron  (ZOFRAN ) injection 4 mg (4 mg Intravenous Given 03/10/24 0939)  morphine  (PF) 2 MG/ML injection 2 mg (2 mg Intravenous Given 03/10/24 0939)  iohexol  (OMNIPAQUE ) 300 MG/ML solution 100 mL (100 mLs Intravenous Contrast Given 03/10/24 1042)    ED Course/ Medical Decision Making/ A&P                                 Medical Decision Making Amount and/or Complexity of Data Reviewed Labs: ordered. Radiology: ordered.  Risk Prescription drug management.   TELA LANDERO is a 83 y.o. female with comorbidities that complicate the patient evaluation including small bowel AVM status post APC, hiatal hernia, hypertension, CVA, and iron deficiency anemia who presents emergency department abdominal pain.   Initial Ddx:  Diverticulitis, UTI, bowel obstruction, mesenteric ischemia, GI bleed   MDM/Course:  patient resents emergency department with lower abdominal pain.  Also has had some nausea.  Not complaining of any melena or hematochezia.  On exam does have lower abdominal tenderness to palpation specially suprapubic and on the left.  Does have some nausea and was given morphine  and Zofran  for symptoms.  She underwent a CT scan which showed some dilation of the biliary system that has been seen previously.  Does have a history of cholecystectomy so wondering if this is the cause.  There are no obvious signs of biliary obstruction on her CT scan.  She was notified of these findings.  Her LFTs are all normal.  Not having significant right upper quadrant tenderness to palpation suspect this is an incidental finding.  Was able to tolerate p.o.  Upon re-evaluation is very well-appearing.  Will discharge home with PCP follow-up.  This patient presents to the ED for concern of complaints listed in HPI, this involves an extensive number of treatment options, and is a complaint that carries with it a high risk of  complications and morbidity. Disposition including potential need for admission considered.   Dispo: DC Home. Return precautions discussed including, but not limited to, those listed in the AVS. Allowed pt time to ask questions which were answered fully prior to dc.  Records reviewed Outpatient Clinic Notes The following labs were independently interpreted: Chemistry and show no acute abnormality I independently reviewed the following imaging with scope of interpretation limited to determining acute life threatening conditions related to emergency care: CT Abdomen/Pelvis and agree with the radiologist interpretation with the following exceptions: none I personally reviewed and interpreted cardiac monitoring: normal sinus rhythm  I have reviewed the patients home medications and made adjustments as needed  Social Determinants of health:  Geriatric  Portions of this note were generated with Scientist, clinical (histocompatibility and immunogenetics). Dictation errors may occur despite best attempts at proofreading.     Final Clinical Impression(s) / ED Diagnoses Final diagnoses:  Abdominal pain, unspecified abdominal location  Nausea    Rx / DC Orders ED Discharge Orders          Ordered    ondansetron  (ZOFRAN -ODT) 4 MG disintegrating tablet  Every 8 hours PRN        03/10/24 1244              Ninetta Basket, MD 03/10/24 1744

## 2024-03-10 NOTE — ED Triage Notes (Signed)
 Patient BIB [facility], Called out for fainting, patient complaint to  EMS of abdominal pain that comes and goes. Patient rates pain 6/10. Hx of anxiety. LBM 4/26.

## 2024-03-10 NOTE — ED Provider Notes (Signed)
 Pt signed out by Dr. Adan Holms pending troponin and CXR.  Trop and CXR neg.  Pt does have high risk syncope.  Unkn etiology. Pt d/w Dr. Yvonne Hering (triad) for admission.     Sueellen Emery, MD 03/10/24 [phone]

## 2024-03-10 NOTE — Discharge Instructions (Addendum)
 You were seen for your abdominal pain in the emergency department.   At home, please take Tylenol  for pain and Zofran  for your nausea and vomiting.    Check your MyChart online for the results of any tests that had not resulted by the time you left the emergency department.   Follow-up with your primary doctor in 2-3 days regarding your visit.    Return immediately to the emergency department if you experience any of the following: Worsening pain, fevers, vomiting despite the medication, or any other concerning symptoms.    Thank you for visiting our Emergency Department. It was a pleasure taking care of you today.

## 2024-03-11 ENCOUNTER — Observation Stay

## 2024-03-11 ENCOUNTER — Telehealth: Payer: Self-pay

## 2024-03-11 DIAGNOSIS — R55 Syncope and collapse: Secondary | ICD-10-CM | POA: Diagnosis not present

## 2024-03-11 NOTE — Plan of Care (Signed)
 Pt remains confused overnight and very forgetful but easily redirectable.  High risk for falls, bed alarm and floor mat in place.   Problem: Education: Goal: Knowledge of General Education information will improve Description: Including pain rating scale, medication(s)/side effects and non-pharmacologic comfort measures Outcome: Progressing   Problem: Health Behavior/Discharge Planning: Goal: Ability to manage health-related needs will improve Outcome: Progressing   Problem: Clinical Measurements: Goal: Ability to maintain clinical measurements within normal limits will improve Outcome: Progressing Goal: Will remain free from infection Outcome: Progressing   Problem: Activity: Goal: Risk for activity intolerance will decrease Outcome: Progressing   Problem: Coping: Goal: Level of anxiety will decrease Outcome: Progressing   Problem: Elimination: Goal: Will not experience complications related to urinary retention Outcome: Progressing   Problem: Safety: Goal: Ability to remain free from injury will improve Outcome: Progressing   Problem: Skin Integrity: Goal: Risk for impaired skin integrity will decrease Outcome: Progressing

## 2024-03-11 NOTE — Discharge Summary (Signed)
 Physician Discharge Summary   Patient: Melody Mitchell MRN: [id number] DOB: 28-Sep-1941  Admit date:     03/10/2024  Discharge date: 03/11/24  Discharge Physician: Wynetta Heckle   PCP: [facility]   Recommendations at discharge:  Follow up with cardiology after outpatient cardiac monitoring period complete (for syncope).  Follow up with PCP for ongoing medical care in the next 1-2 weeks. Consider recheck BMP.   Discharge Diagnoses: Principal Problem:   Syncope Active Problems:   Hypokalemia   Lightheadedness   Hypovolemia  Hospital Course: HPI: Melody Mitchell is a 83 y.o. female with medical history significant for iron deficiency anemia, GERD, HTN, arthritis small bowel AVM, HLD, CVA and depression who was initially evaluated in the ED for abdominal pain patient was discharged home from the ED to follow-up with PCP after workup with abdominal imaging and labs were unremarkable and patient was able to tolerate p.o. intake. While waiting for her ride in the ED, patient informed security that she felt lightheaded. Patient was sat in the wheelchair but later, security noticed that she started sliding out of the wheelchair. She did not lose consciousness and did not hit her head. On evaluation, patient was eating her dinner. She reports that she felt very lightheaded while in the ED. She thinks she has not been drinking enough water .She denies any abdominal pain, chest pain, shortness of breath, chest pain, palpitations, nausea, vomiting, headache or vision changes.  No recent hematuria, bloody stools, falls or syncope.   ED Course: Vitals were notable for temp 98.2, RR 17, HR 67, BP 105/71, SpO2 100% on room air. Labs overall unremarkable with normal LFTs and kidney function, K+ 3.4, lipase 66(from 74 1 year ago), normal CBC, UA with no signs of infection, normal troponins x 2.  EKG showed normal sinus rhythm rate of 77 and artifact in multiple leads. CXR with cardiomegaly but  no acute cardiopulmonary disease.  Patient was given IV LR 1 L bolus and TRH was consulted for admission.  She was observed on cardiac telemetry with no dysrhythmias detected (just sinus bradycardia with rate in high 50's-60's) and no new symptoms reported. The following morning, orthostatic vital signs are normal, she's eating and drinking normally, feels ready to go home.   Assessment and Plan: Melody Mitchell is a 83 y.o. female with medical history significant for  iron deficiency anemia, GERD, HTN, arthritis small bowel AVM, HLD, CVA and depression who was admitted for syncope episode. No further episodes, work up reassuring, vitals stable. Below is problem-based assessments and plans from admission.   # Syncope # Lightheadedness - Became lightheaded while in the ED and slid out of wheelchair - Found to have low blood pressure with SBP in the 100s - Patient dry and dehydrated on exam - Mild syncope and lightheadedness secondary to dehydration and hypovolemia - Will admit on telemetry for observation - Start IV NS at 100 cc/h for 10 hours - Check orthostatic vitals - Check TSH, mag and phos - Fall precautions   # Iron deficiency anemia - Followed by oncology in the outpatient - Hemoglobin stable at 12.1 (baseline around 10-11), likely mild hemoconcentration - Trend CBC   # HTN - BP improved with SBP now in the 120s to 130s - CTM   # Hypokalemia - History of hypokalemia on potassium supplementation - Continue home potassium 20 mEq daily - Follow-up morning BMP   # HLD - Continue atorvastatin  and Zetia    #  Depression - Continue Lexapro  and mirtazapine   # GERD - Continue Protonix    Consultants: None Procedures performed: None  Disposition: Home Diet recommendation: Regular DISCHARGE MEDICATION: Allergies as of 03/11/2024       Reactions   Latex Hives, Itching, Rash   Sulfa Antibiotics Hives, Itching   Other reaction(s): hives   Other Hives   Pravastatin Sodium  Other (See Comments)        Medication List     TAKE these medications    acetaminophen  500 MG tablet Commonly known as: TYLENOL  Take 1,000 mg by mouth every 6 (six) hours as needed for moderate pain.   amitriptyline  25 MG tablet Commonly known as: ELAVIL  Take 25 mg by mouth at bedtime.   aspirin  EC 325 MG tablet Take 325 mg by mouth daily.   atorvastatin  40 MG tablet Commonly known as: LIPITOR Take 40 mg by mouth daily.   escitalopram  10 MG tablet Commonly known as: LEXAPRO  Take 10 mg by mouth daily.   ezetimibe  10 MG tablet Commonly known as: ZETIA  Take 10 mg by mouth daily.   mirtazapine 30 MG disintegrating tablet Commonly known as: REMERON SOL-TAB Take 30 mg by mouth at bedtime.   omega-3 fish oil 1000 MG Caps capsule Commonly known as: MAXEPA Take 1 capsule by mouth daily.   ondansetron  4 MG disintegrating tablet Commonly known as: ZOFRAN -ODT Take 1 tablet (4 mg total) by mouth every 8 (eight) hours as needed.   pantoprazole  40 MG tablet Commonly known as: PROTONIX  Take 40 mg by mouth 2 (two) times daily.   potassium chloride  10 MEQ CR capsule Commonly known as: MICRO-K  Take 10 mEq by mouth 2 (two) times daily.   potassium chloride  SA 20 MEQ tablet Commonly known as: KLOR-CON  M Take 20 mEq by mouth daily.   Vitamin B-12 CR 1000 MCG Tbcr Take 1 tablet (1,000 mcg total) by mouth daily.        Follow-up Information     [facility] Schedule an appointment as soon as possible for a visit.   Contact information: [address] [place] [address] [phone]         Gerard Knight, MD. Schedule an appointment as soon as possible for a visit.   Specialty: Cardiology Contact information: [address] [place] [address] [phone]                Discharge Exam: Filed Weights   03/10/24 1426  Weight: 58 kg  BP 121/73   Pulse (!) 54   Temp 98.3 F (36.8 C) (Oral)   Resp 19   Ht 5\' 4"   (1.626 m)   Wt 58 kg   SpO2 97%   BMI 21.95 kg/m   Well-appearing older female in no distress Regular borderline bradycardia, no MRG or edema Alert, oriented, nonfocal Clear, nonlabored Soft, NT, ND, +BS  Condition at discharge: stable  The results of significant diagnostics from this hospitalization (including imaging, microbiology, ancillary and laboratory) are listed below for reference.   Imaging Studies: DG Chest Portable 1 View Result Date: 03/10/2024 CLINICAL DATA:  Syncope EXAM: PORTABLE CHEST 1 VIEW COMPARISON:  10/29/2022 FINDINGS: Numerous leads and wires project over the chest. Midline trachea. Mild cardiomegaly. Tortuous thoracic aorta. Right paratracheal density is relatively similar back to at least 11/13/2021, likely due to prominent great vessels. No pleural effusion or pneumothorax. No congestive failure. Clear lungs. IMPRESSION: No acute process or explanation for syncope. Cardiomegaly without congestive failure. Electronically  Signed   By: Lore Rode M.D.   On: 03/10/2024 16:46   CT ABDOMEN PELVIS W CONTRAST Result Date: 03/10/2024 CLINICAL DATA:  Left lower quadrant abdominal pain. Elevated lipase. EXAM: CT ABDOMEN AND PELVIS WITH CONTRAST TECHNIQUE: Multidetector CT imaging of the abdomen and pelvis was performed using the standard protocol following bolus administration of intravenous contrast. RADIATION DOSE REDUCTION: This exam was performed according to the departmental dose-optimization program which includes automated exposure control, adjustment of the mA and/or kV according to patient size and/or use of iterative reconstruction technique. CONTRAST:  OMNIPAQUE  IOHEXOL  300 MG/ML  SOLN COMPARISON:  CT abdomen and pelvis with contrast 12/29/2022 FINDINGS: Lower chest: Lung bases are clear. Heart size is normal. No significant pleural or pericardial effusion is present. Hepatobiliary: Progressive intra and extrahepatic biliary dilation is present. The common bile  duct measures up to 10 mm. Cholecystectomy noted. No focal hepatic lesions are present. Pancreas: Progressive dilation of the pancreatic duct is present. No obstructing mass is present at the pancreatic head. No focal inflammation or other mass lesion is present. Spleen: Normal in size without focal abnormality. Adrenals/Urinary Tract: The adrenal glands are normal bilaterally. Kidneys are unremarkable. No stone or mass lesion is present. Ureters are normal. The urinary bladder is within normal limits. Stomach/Bowel: The stomach and duodenum are within normal limits. Small bowel is unremarkable. Terminal ileum is within normal limits. Appendix is absent. The ascending and transverse colon are within normal limits. The descending and sigmoid colon are normal. Vascular/Lymphatic: Atherosclerotic calcifications are present in the aorta and branch vessels. No aneurysm is present. No significant adenopathy is present. Reproductive: Uterus and bilateral adnexa are unremarkable. Other: No abdominal wall hernia or abnormality. No abdominopelvic ascites. Musculoskeletal: Vacuum disc is present at L5-S1. Endplate degenerative changes are thoracic spine T9-10 T11-12. Vertebral body heights are maintained. Alignment is anatomic. The bony pelvis is normal. The hips are located and normal. IMPRESSION: 1. Progressive intra and extrahepatic biliary dilation and pancreatic duct dilation. No obstructing mass is present at the pancreatic head. 2. No other acute or focal lesion to explain the patient's symptoms. 3. Status post cholecystectomy and appendectomy. 4. Degenerative changes of the thoracic and lumbar spine. Electronically Signed   By: Audree Leas M.D.   On: 03/10/2024 11:17    Microbiology: Results for orders placed or performed during the hospital encounter of 12/29/22  Resp panel by RT-PCR (RSV, Flu A&B, Covid) Anterior Nasal Swab     Status: None   Collection Time: 12/29/22  1:03 PM   Specimen: Anterior  Nasal Swab  Result Value Ref Range Status   SARS Coronavirus 2 by RT PCR NEGATIVE NEGATIVE Final    Comment: (NOTE) SARS-CoV-2 target nucleic acids are NOT DETECTED.  The SARS-CoV-2 RNA is generally detectable in upper respiratory specimens during the acute phase of infection. The lowest concentration of SARS-CoV-2 viral copies this assay can detect is 138 copies/mL. A negative result does not preclude SARS-Cov-2 infection and should not be used as the sole basis for treatment or other patient management decisions. A negative result may occur with  improper specimen collection/handling, submission of specimen other than nasopharyngeal swab, presence of viral mutation(s) within the areas targeted by this assay, and inadequate number of viral copies(<138 copies/mL). A negative result must be combined with clinical observations, patient history, and epidemiological information. The expected result is Negative.  Fact Sheet for Patients:  BloggerCourse.com  Fact Sheet for Healthcare Providers:  SeriousBroker.it  This test is no t  yet approved or cleared by the [place]  FDA and  has been authorized for detection and/or diagnosis of SARS-CoV-2 by FDA under an Emergency Use Authorization (EUA). This EUA will remain  in effect (meaning this test can be used) for the duration of the COVID-19 declaration under Section 564(b)(1) of the Act, 21 U.S.C.section 360bbb-3(b)(1), unless the authorization is terminated  or revoked sooner.       Influenza A by PCR NEGATIVE NEGATIVE Final   Influenza B by PCR NEGATIVE NEGATIVE Final    Comment: (NOTE) The Xpert Xpress SARS-CoV-2/FLU/RSV plus assay is intended as an aid in the diagnosis of influenza from Nasopharyngeal swab specimens and should not be used as a sole basis for treatment. Nasal washings and aspirates are unacceptable for Xpert Xpress SARS-CoV-2/FLU/RSV testing.  Fact Sheet for  Patients: BloggerCourse.com  Fact Sheet for Healthcare Providers: SeriousBroker.it  This test is not yet approved or cleared by the [place]  FDA and has been authorized for detection and/or diagnosis of SARS-CoV-2 by FDA under an Emergency Use Authorization (EUA). This EUA will remain in effect (meaning this test can be used) for the duration of the COVID-19 declaration under Section 564(b)(1) of the Act, 21 U.S.C. section 360bbb-3(b)(1), unless the authorization is terminated or revoked.     Resp Syncytial Virus by PCR NEGATIVE NEGATIVE Final    Comment: (NOTE) Fact Sheet for Patients: BloggerCourse.com  Fact Sheet for Healthcare Providers: SeriousBroker.it  This test is not yet approved or cleared by the [place]  FDA and has been authorized for detection and/or diagnosis of SARS-CoV-2 by FDA under an Emergency Use Authorization (EUA). This EUA will remain in effect (meaning this test can be used) for the duration of the COVID-19 declaration under Section 564(b)(1) of the Act, 21 U.S.C. section 360bbb-3(b)(1), unless the authorization is terminated or revoked.  Performed at [facility], [address] [place], [place] [address]     Labs: CBC: Recent Labs  Lab 03/10/24 0942  WBC 6.0  NEUTROABS 4.8  HGB 12.1  HCT 38.5  MCV 94.4  PLT 302   Basic Metabolic Panel: Recent Labs  Lab 03/10/24 0942 03/10/24 1614  NA 139  --   K 3.4*  --   CL 104  --   CO2 25  --   GLUCOSE 84  --   BUN 9  --   CREATININE 0.79  --   CALCIUM  9.3  --   MG  --  1.8  PHOS  --  3.2   Liver Function Tests: Recent Labs  Lab 03/10/24 0942  AST 17  ALT 11  ALKPHOS 69  BILITOT 0.4  PROT 7.0  ALBUMIN 3.7   CBG: No results for input(s): "GLUCAP" in the last 168 hours.  Discharge time spent: greater than 30 minutes.  Signed: Wynetta Heckle, MD [facility] 03/11/2024

## 2024-03-11 NOTE — Telephone Encounter (Signed)
 Dr.Ryan Grunz requests 14 day Zio for syncope.  Will be mailed to patient home

## 2024-03-13 ENCOUNTER — Inpatient Hospital Stay

## 2024-03-14 ENCOUNTER — Inpatient Hospital Stay: Attending: Hematology

## 2024-03-14 VITALS — BP 102/67 | HR 64 | Temp 98.6°F | Resp 18

## 2024-03-14 DIAGNOSIS — D508 Other iron deficiency anemias: Secondary | ICD-10-CM | POA: Diagnosis present

## 2024-03-14 DIAGNOSIS — D500 Iron deficiency anemia secondary to blood loss (chronic): Secondary | ICD-10-CM

## 2024-03-14 MED ORDER — ACETAMINOPHEN 325 MG PO TABS
650.0000 mg | ORAL_TABLET | Freq: Once | ORAL | Status: AC
  Administered 2024-03-14: 650 mg via ORAL
  Filled 2024-03-14: qty 2

## 2024-03-14 MED ORDER — CETIRIZINE HCL 10 MG PO TABS
10.0000 mg | ORAL_TABLET | Freq: Once | ORAL | Status: AC
  Administered 2024-03-14: 10 mg via ORAL
  Filled 2024-03-14: qty 1

## 2024-03-14 MED ORDER — SODIUM CHLORIDE 0.9 % IV SOLN
INTRAVENOUS | Status: DC
Start: 2024-03-14 — End: 2024-03-14

## 2024-03-14 MED ORDER — SODIUM CHLORIDE 0.9 % IV SOLN
510.0000 mg | Freq: Once | INTRAVENOUS | Status: AC
  Administered 2024-03-14: 510 mg via INTRAVENOUS
  Filled 2024-03-14: qty 510

## 2024-03-14 NOTE — Patient Instructions (Signed)

## 2024-03-14 NOTE — Progress Notes (Signed)
 Patient tolerated iron infusion with no complaints voiced.  Peripheral IV site clean and dry with good blood return noted before and after infusion.  Band aid applied.  VSS with discharge and left in satisfactory condition with no s/s of distress noted.

## 2024-06-05 ENCOUNTER — Inpatient Hospital Stay: Attending: Hematology

## 2024-06-05 DIAGNOSIS — K5520 Angiodysplasia of colon without hemorrhage: Secondary | ICD-10-CM | POA: Diagnosis not present

## 2024-06-05 DIAGNOSIS — D508 Other iron deficiency anemias: Secondary | ICD-10-CM | POA: Diagnosis present

## 2024-06-05 DIAGNOSIS — Z87891 Personal history of nicotine dependence: Secondary | ICD-10-CM | POA: Diagnosis not present

## 2024-06-05 DIAGNOSIS — E559 Vitamin D deficiency, unspecified: Secondary | ICD-10-CM | POA: Diagnosis not present

## 2024-06-05 DIAGNOSIS — E538 Deficiency of other specified B group vitamins: Secondary | ICD-10-CM | POA: Diagnosis not present

## 2024-06-05 DIAGNOSIS — R630 Anorexia: Secondary | ICD-10-CM | POA: Diagnosis not present

## 2024-06-05 DIAGNOSIS — R634 Abnormal weight loss: Secondary | ICD-10-CM | POA: Insufficient documentation

## 2024-06-05 DIAGNOSIS — T452X1A Poisoning by vitamins, accidental (unintentional), initial encounter: Secondary | ICD-10-CM

## 2024-06-05 DIAGNOSIS — K909 Intestinal malabsorption, unspecified: Secondary | ICD-10-CM | POA: Diagnosis not present

## 2024-06-05 DIAGNOSIS — D500 Iron deficiency anemia secondary to blood loss (chronic): Secondary | ICD-10-CM

## 2024-06-05 LAB — COMPREHENSIVE METABOLIC PANEL WITH GFR
ALT: 8 U/L (ref 0–44)
AST: 15 U/L (ref 15–41)
Albumin: 3.6 g/dL (ref 3.5–5.0)
Alkaline Phosphatase: 56 U/L (ref 38–126)
Anion gap: 10 (ref 5–15)
BUN: 7 mg/dL — ABNORMAL LOW (ref 8–23)
CO2: 26 mmol/L (ref 22–32)
Calcium: 9.3 mg/dL (ref 8.9–10.3)
Chloride: 106 mmol/L (ref 98–111)
Creatinine, Ser: 0.76 mg/dL (ref 0.44–1.00)
GFR, Estimated: >60 mL/min (ref >60)
Glucose, Bld: 94 mg/dL (ref 70–99)
Potassium: 3.3 mmol/L — ABNORMAL LOW (ref 3.5–5.1)
Sodium: 142 mmol/L (ref 135–145)
Total Bilirubin: 0.4 mg/dL (ref 0.0–1.2)
Total Protein: 6.8 g/dL (ref 6.5–8.1)

## 2024-06-05 LAB — CBC WITH DIFFERENTIAL/PLATELET
Abs Immature Granulocytes: 0.02 K/uL (ref 0.00–0.07)
Basophils Absolute: 0 K/uL (ref 0.0–0.1)
Basophils Relative: 0 %
Eosinophils Absolute: 0.1 K/uL (ref 0.0–0.5)
Eosinophils Relative: 1 %
HCT: 37.8 % (ref 36.0–46.0)
Hemoglobin: 11.7 g/dL — ABNORMAL LOW (ref 12.0–15.0)
Immature Granulocytes: 0 %
Lymphocytes Relative: 13 %
Lymphs Abs: 0.7 K/uL (ref 0.7–4.0)
MCH: 30.2 pg (ref 26.0–34.0)
MCHC: 31 g/dL (ref 30.0–36.0)
MCV: 97.7 fL (ref 80.0–100.0)
Monocytes Absolute: 0.5 K/uL (ref 0.1–1.0)
Monocytes Relative: 9 %
Neutro Abs: 4 K/uL (ref 1.7–7.7)
Neutrophils Relative %: 77 %
Platelets: 298 K/uL (ref 150–400)
RBC: 3.87 MIL/uL (ref 3.87–5.11)
RDW: 14.4 % (ref 11.5–15.5)
WBC: 5.3 K/uL (ref 4.0–10.5)
nRBC: 0 % (ref 0.0–0.2)

## 2024-06-05 LAB — VITAMIN B12: Vitamin B-12: 377 pg/mL (ref 180–914)

## 2024-06-05 LAB — VITAMIN D 25 HYDROXY (VIT D DEFICIENCY, FRACTURES): Vit D, 25-Hydroxy: 33.09 ng/mL (ref 30–100)

## 2024-06-05 LAB — IRON AND TIBC
Iron: 44 ug/dL (ref 28–170)
Saturation Ratios: 17 % (ref 10.4–31.8)
TIBC: 265 ug/dL (ref 250–450)
UIBC: 221 ug/dL

## 2024-06-05 LAB — FERRITIN: Ferritin: 134 ng/mL (ref 11–307)

## 2024-06-07 LAB — METHYLMALONIC ACID, SERUM: Methylmalonic Acid, Quantitative: 107 nmol/L (ref 0–378)

## 2024-06-12 ENCOUNTER — Inpatient Hospital Stay (HOSPITAL_BASED_OUTPATIENT_CLINIC_OR_DEPARTMENT_OTHER): Admitting: Oncology

## 2024-06-12 VITALS — BP 119/77 | HR 70 | Temp 98.1°F | Resp 16 | Wt 118.8 lb

## 2024-06-12 DIAGNOSIS — D500 Iron deficiency anemia secondary to blood loss (chronic): Secondary | ICD-10-CM

## 2024-06-12 DIAGNOSIS — T452X1A Poisoning by vitamins, accidental (unintentional), initial encounter: Secondary | ICD-10-CM

## 2024-06-12 DIAGNOSIS — E559 Vitamin D deficiency, unspecified: Secondary | ICD-10-CM

## 2024-06-12 DIAGNOSIS — E538 Deficiency of other specified B group vitamins: Secondary | ICD-10-CM

## 2024-06-12 DIAGNOSIS — D508 Other iron deficiency anemias: Secondary | ICD-10-CM | POA: Diagnosis not present

## 2024-06-12 DIAGNOSIS — R634 Abnormal weight loss: Secondary | ICD-10-CM | POA: Diagnosis not present

## 2024-06-12 MED ORDER — FERROUS GLUCONATE 324 (38 FE) MG PO TABS: 324.0000 mg | ORAL_TABLET | Freq: Every evening | ORAL | 3 refills | Status: DC

## 2024-06-12 MED ORDER — FERROUS GLUCONATE 324 (38 FE) MG PO TABS: 324.0000 mg | ORAL_TABLET | Freq: Every day | ORAL | 3 refills | Status: DC

## 2024-06-12 NOTE — Progress Notes (Signed)
 [facility] [address], [place] [address]   CLINIC:  Medical Oncology/Hematology  PCP:  [facility] [address] [place] [address] [phone]   REASON FOR VISIT:  Follow-up for iron deficiency anemia   CURRENT THERAPY: Intermittent IV iron infusions    INTERVAL HISTORY:   [name].o. female returns for routine follow-up of iron deficiency anemia.  She receives intermittent IV iron last given on 03/06/2024 and 03/23/2024 510 mg IV [name] .  At today's visit, she reports feeling fairly well.  Denies any recent hospitalizations, surgeries or changes to baseline health.   Reports persistent fatigue and headache although not as bad as it was before. She denies any pica, lightheadedness, chest pain, dyspnea on exertion, or syncope.  She has not noticed any recent black bowel movements or melanotic stool that she can recall.  She denies any other bleeding such as epistaxis, hematemesis, hematuria, or hematochezia.   She reports 50% energy and 50% appetite.  Her weight has been stable since her last visit.  ASSESSMENT & PLAN:  1.  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  She presented to ED with weakness and fatigue, found to be hemoccult positive with Hgb 8.3 (dropped from 10.0 two weeks prior).  She received 1 unit PRBC.   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  Renal function normal for age. - Last [name]  x 2 in April/May 2025. - Most recent labs (06/05/2024): Hgb 11.7/MCV 97.7.  Ferritin 134, iron saturation 17%.  Normal creatinine 0.76. - She does not recall Melody  last time that she noticed any melanotic stool - She is followed by Melody GI providers at [facility] -Recommend 1 dose of IV [name] . -Patient has never tried oral iron.  We discussed ferrous gluconate  324 mg every night before bedtime at least 6 hours from her Protonix .  She may take with vitamin C or orange juice to increase absorption.  We discussed side effects of oral iron including constipation and GI upset.  May take a stool softener as needed for constipation. -Return to clinic in 3 months with labs a few days before.   2.  Vitamin D  deficiency, with subsequent overload - Patient previously required vitamin D  supplementation, but was mistakenly taking this too frequently and did not stop when instructed - Vitamin D  peaked at 193.00 on 05/03/2022 - Most recent labs (06/05/2024) show vitamin D  33.09. -Vitamin D  level has dropped and is erring on Melody low side.  Recommend restarting 3000 to 5000 units vitamin D  daily.  3.  Borderline vitamin B12 deficiency: - Labs on 05/05/2023 show vitamin B12 at 655 with normal methylmalonic acid - She is not sure if she is taking her B12 supplement or not, but it is listed on her medication list from her pharmacy - Most recent labs (11/23/2023): Normal B12 377 and normal MMA 107.   4.  Poor appetite and weight loss    - Reports decreased appetite for Melody past several months  - She has noted to have increasing memory deficits, and there is some concern that she may be  forgetting to eat - Weight today (06/12/24) is 8 pounds less then in April 2025. -Recommend she continue drinking 1-2 Ensure/boost daily.    PLAN SUMMARY: >> IV [name]  x 1 >> Labs in 3 months = CBC/D, CMP, ferritin, iron/TIBC, B12, MMA, vitamin D  >> Start iron supplements 324 mg nightly with vitamin C. >> OFFICE visit in 3 months (1 week after labs)     REVIEW OF SYSTEMS:   Review of Systems  Constitutional:  Positive for fatigue and unexpected weight  change.  Neurological:  Positive for headaches.     PHYSICAL EXAM:  ECOG PERFORMANCE STATUS: 2 - Symptomatic, <50% confined to bed  Vitals:   06/12/24 0908  BP: 119/77  Pulse: 70  Resp: 16  Temp: 98.1 F (36.7 C)  SpO2: 95%    Filed Weights   06/12/24 0908  Weight: 118 lb 13.3 oz (53.9 kg)    Physical Exam Constitutional:      Appearance: Normal appearance.  Cardiovascular:     Rate and Rhythm: Normal rate and regular rhythm.  Pulmonary:     Effort: Pulmonary effort is normal.     Breath sounds: Normal breath sounds.  Abdominal:     General: Bowel sounds are normal.     Palpations: Abdomen is soft.  Musculoskeletal:        General: No swelling. Normal range of motion.  Neurological:     Mental Status: She is alert and oriented to person, place, and time. Mental status is at baseline.    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 four colon polyps removed. No source  for change in bowel habits identified 3. Rectal bleeding due to large internal hemorrhoids    COLONOSCOPY W/ BIOPSIES  6 2010   Dr. harvey: Polypoid appearing lesion of Melody ascending colon, 3 mm sessile rectal polyp, small internal hemorrhoids. Pathology revealed polypoid mucosa, no adenomatous changes   Double-balloon enteroscopy, antegrade  April 2011   Dr. Myriam: Multiple  duodenal and jejunal angiectasia is treated with APC.   ENTEROSCOPY N/A 06/02/2022   Procedure: ENTEROSCOPY;  Surgeon: Shaaron Lamar HERO, MD;  [facility];  Service: Endoscopy;  Laterality: N/A;  with pediatric colonoscope   ESOPHAGOGASTRODUODENOSCOPY  04/2009   Dr. harvey: Patent Schatzki ring dilated with advancing Melody scope, patchy erythema in Melody antrum, 2 small AVMs in Melody duodenal bulb, 2 additional AVMs noted in Melody second portion Melody duodenum, mild gastritis on pathology   ESOPHAGOGASTRODUODENOSCOPY N/A 06/29/2015   SLF: 1. stricture at Melody gastro esophageal junction 2. small hiatal hernia 3. Mild non-erosive gastririts- NO obvious source for dyspepsia identified.    ESOPHAGOGASTRODUODENOSCOPY N/A 03/13/2020   Fields: Low-grade narrowing Schatzki ring, small hiatal hernia, multiple nonbleeding angiectasia's  in Melody jejunum treated with APC.  Spot tattoo in jejunum at 90 cm from Melody incisors.   ESOPHAGOGASTRODUODENOSCOPY N/A 06/02/2022   Procedure: ESOPHAGOGASTRODUODENOSCOPY (EGD);  Surgeon: Shaaron Lamar HERO, MD;  [facility];  Service: Endoscopy;  Laterality: N/A;   EYE SURGERY     HOT HEMOSTASIS  06/02/2022   Procedure: HOT HEMOSTASIS (ARGON PLASMA COAGULATION/BICAP);  Surgeon: Shaaron Lamar HERO, MD;  [facility];  Service: Endoscopy;;    SOCIAL HISTORY:  Social History   Socioeconomic History   Marital status: Divorced    Spouse name: Not on file   Number of children: 1   Years of education: Not on file   Highest education level: Not on file  Occupational History   Not on file  Tobacco Use   Smoking status: Former    Current packs/day: 0.00    Average packs/day: 1 pack/day for 25.0 years (25.0 ttl pk-yrs)    Types: Cigarettes    Start date: 09/13/1982    Quit date: 09/14/2007    Years since quitting: 16.7   Smokeless tobacco: Never  Vaping Use   Vaping status: Never Used  Substance and Sexual Activity   Alcohol  use: Not Currently    Comment:  occasional/rare (Brandy every now and then)   Drug use: No   Sexual activity: Not Currently    Birth control/protection: Post-menopausal, Surgical  Other Topics Concern   Not on file  Social History Narrative   Not on file   Social Drivers of Health   Financial Resource Strain: Not on file  Food Insecurity: No Food Insecurity (03/10/2024)   Hunger Vital Sign    Worried About Running Out of Food in Melody Last Year: Never true    Ran Out of Food in Melody Last Year: Never true  Transportation Needs: No Transportation Needs (03/10/2024)   PRAPARE - Administrator, Civil Service (Medical): No    Lack of Transportation (Non-Medical): No  Physical Activity: Not on file  Stress: Not on file  Social Connections: Moderately Integrated (03/10/2024)   Social Connection and Isolation Panel    Frequency of Communication with Friends and Family: More than three times a week    Frequency of Social Gatherings with Friends and Family: Never    Attends Religious Services: 1 to 4 times per year    Active Member of [employer] or Organizations: Yes    Attends Banker Meetings: 1 to 4 times per year    Marital Status: Divorced  Catering manager Violence: Not At Risk (03/10/2024)   Humiliation, Afraid, Rape, and Kick questionnaire    Fear of Current or Ex-Partner: No    Emotionally Abused: No    Physically Abused: No    Sexually Abused: No    FAMILY HISTORY:  Family History  Problem Relation Age of Onset   Heart failure Mother    Diabetes Sister    Heart attack Father    Colon cancer Neg Hx    Gastric cancer Neg Hx    Esophageal cancer Neg Hx     CURRENT MEDICATIONS:  Outpatient Encounter Medications as of 06/12/2024  Medication Sig   acetaminophen  (TYLENOL ) 500 MG tablet Take 1,000 mg by mouth every 6 (six) hours as needed for moderate pain.   atorvastatin  (LIPITOR) 40 MG tablet Take 40 mg by mouth daily.   Cyanocobalamin  (VITAMIN B-12 CR) 1000 MCG TBCR Take 1 tablet (1,000  mcg total) by mouth daily.   escitalopram  (LEXAPRO ) 10  MG tablet Take 10 mg by mouth daily.   ezetimibe  (ZETIA ) 10 MG tablet Take 10 mg by mouth daily.   mirtazapine  (REMERON  SOL-TAB) 30 MG disintegrating tablet Take 30 mg by mouth at bedtime.   pantoprazole  (PROTONIX ) 40 MG tablet Take 40 mg by mouth 2 (two) times daily.   potassium chloride  SA (KLOR-CON  M) 20 MEQ tablet Take 20 mEq by mouth daily.   [DISCONTINUED] ferrous gluconate  (FERGON) 324 MG tablet Take 1 tablet (324 mg total) by mouth daily with breakfast.   ferrous gluconate  (FERGON) 324 MG tablet Take 1 tablet (324 mg total) by mouth at bedtime.   ondansetron  (ZOFRAN -ODT) 4 MG disintegrating tablet Take 1 tablet (4 mg total) by mouth every 8 (eight) hours as needed. (Patient not taking: Reported on 06/12/2024)   [DISCONTINUED] amitriptyline  (ELAVIL ) 25 MG tablet Take 25 mg by mouth at bedtime.   [DISCONTINUED] aspirin  EC 325 MG tablet Take 325 mg by mouth daily.   [DISCONTINUED] omega-3 fish oil (MAXEPA) 1000 MG CAPS capsule Take 1 capsule by mouth daily.   [DISCONTINUED] potassium chloride  (MICRO-K ) 10 MEQ CR capsule Take 10 mEq by mouth 2 (two) times daily.   Facility-Administered Encounter Medications as of 06/12/2024  Medication   0.9 %  sodium chloride  infusion    ALLERGIES:  Allergies  Allergen Reactions   Latex Hives, Itching and Rash   Sulfa Antibiotics Hives and Itching    Other reaction(s): hives   Other Hives   Pravastatin Sodium Other (See Comments)    LABORATORY DATA:  I have reviewed Melody labs as listed.  CBC    Component Value Date/Time   WBC 5.3 06/05/2024 0924   RBC 3.87 06/05/2024 0924   HGB 11.7 (L) 06/05/2024 0924   HCT 37.8 06/05/2024 0924   PLT 298 06/05/2024 0924   MCV 97.7 06/05/2024 0924   MCH 30.2 06/05/2024 0924   MCHC 31.0 06/05/2024 0924   RDW 14.4 06/05/2024 0924   LYMPHSABS 0.7 06/05/2024 0924   MONOABS 0.5 06/05/2024 0924   EOSABS 0.1 06/05/2024 0924   BASOSABS 0.0 06/05/2024 0924       Latest Ref Rng & Units 06/05/2024    9:24 AM 03/10/2024    9:42 AM 02/27/2024    9:23 AM  CMP  Glucose 70 - 99 mg/dL 94  84  79   BUN 8 - 23 mg/dL 7  9  10    Creatinine 0.44 - 1.00 mg/dL 9.23  9.20  9.19   Sodium 135 - 145 mmol/L 142  139  142   Potassium 3.5 - 5.1 mmol/L 3.3  3.4  4.0   Chloride 98 - 111 mmol/L 106  104  107   CO2 22 - 32 mmol/L 26  25  26    Calcium  8.9 - 10.3 mg/dL 9.3  9.3  9.5   Total Protein 6.5 - 8.1 g/dL 6.8  7.0  7.0   Total Bilirubin 0.0 - 1.2 mg/dL 0.4  0.4  0.3   Alkaline Phos 38 - 126 U/L 56  69  63   AST 15 - 41 U/L 15  17  18    ALT 0 - 44 U/L 8  11  9      DIAGNOSTIC IMAGING:  I have independently reviewed Melody relevant imaging and discussed with Melody patient.   WRAP UP:  All questions were answered. Melody patient knows to call Melody clinic with any problems, questions or concerns.  Medical decision making: Moderate  Time spent on visit: I spent  20 minutes counseling Melody patient face to face. Melody total time spent in Melody appointment was 30 minutes and more than 50% was on counseling.  Delon FORBES Hope, NP  06/12/24 9:41 AM

## 2024-06-12 NOTE — Patient Instructions (Signed)
 Start Iron (ferrous gluconate ) 324 mg at bedtime atleast 6 hours from Pantoprozole.   Take with orange juice or vitamin C tablet.

## 2024-06-13 ENCOUNTER — Ambulatory Visit: Admitting: Neurology

## 2024-06-21 ENCOUNTER — Inpatient Hospital Stay: Attending: Hematology

## 2024-06-21 VITALS — BP 102/66 | HR 62 | Temp 98.1°F | Resp 18

## 2024-06-21 DIAGNOSIS — E538 Deficiency of other specified B group vitamins: Secondary | ICD-10-CM | POA: Diagnosis not present

## 2024-06-21 DIAGNOSIS — D500 Iron deficiency anemia secondary to blood loss (chronic): Secondary | ICD-10-CM

## 2024-06-21 DIAGNOSIS — E559 Vitamin D deficiency, unspecified: Secondary | ICD-10-CM | POA: Insufficient documentation

## 2024-06-21 DIAGNOSIS — D508 Other iron deficiency anemias: Secondary | ICD-10-CM | POA: Insufficient documentation

## 2024-06-21 DIAGNOSIS — K5521 Angiodysplasia of colon with hemorrhage: Secondary | ICD-10-CM | POA: Insufficient documentation

## 2024-06-21 MED ORDER — SODIUM CHLORIDE 0.9 % IV SOLN: INTRAVENOUS | Status: DC

## 2024-06-21 MED ORDER — CETIRIZINE HCL 10 MG PO TABS
10.0000 mg | ORAL_TABLET | Freq: Once | ORAL | Status: AC
Start: 2024-06-21 — End: 2024-06-21
  Administered 2024-06-21: 10 mg via ORAL
  Filled 2024-06-21: qty 1

## 2024-06-21 MED ORDER — SODIUM CHLORIDE 0.9 % IV SOLN
510.0000 mg | Freq: Once | INTRAVENOUS | Status: AC
  Administered 2024-06-21: 510 mg via INTRAVENOUS
  Filled 2024-06-21: qty 510

## 2024-06-21 MED ORDER — ACETAMINOPHEN 325 MG PO TABS
650.0000 mg | ORAL_TABLET | Freq: Once | ORAL | Status: AC
  Administered 2024-06-21: 650 mg via ORAL
  Filled 2024-06-21: qty 2

## 2024-06-21 NOTE — Progress Notes (Signed)
 Patient tolerated iron infusion with no complaints voiced.  Peripheral IV site clean and dry with good blood return noted before and after infusion.  Band aid applied.  VSS with discharge and left in satisfactory condition with no s/s of distress noted.

## 2024-06-21 NOTE — Patient Instructions (Signed)

## 2024-06-21 NOTE — Progress Notes (Signed)
 SABRA

## 2024-08-27 ENCOUNTER — Other Ambulatory Visit: Payer: Self-pay

## 2024-08-27 ENCOUNTER — Observation Stay (HOSPITAL_COMMUNITY)
Admission: EM | Admit: 2024-08-27 | Discharge: 2024-08-27 | Disposition: A | Discharge status: Skilled Nursing Facility | Source: Skilled Nursing Facility | Attending: Internal Medicine | Admitting: Internal Medicine

## 2024-08-27 ENCOUNTER — Emergency Department (HOSPITAL_COMMUNITY)

## 2024-08-27 DIAGNOSIS — Z8673 Personal history of transient ischemic attack (TIA), and cerebral infarction without residual deficits: Secondary | ICD-10-CM | POA: Diagnosis not present

## 2024-08-27 DIAGNOSIS — R911 Solitary pulmonary nodule: Secondary | ICD-10-CM | POA: Diagnosis not present

## 2024-08-27 DIAGNOSIS — R001 Bradycardia, unspecified: Secondary | ICD-10-CM | POA: Insufficient documentation

## 2024-08-27 DIAGNOSIS — K5730 Diverticulosis of large intestine without perforation or abscess without bleeding: Principal | ICD-10-CM | POA: Insufficient documentation

## 2024-08-27 DIAGNOSIS — R109 Unspecified abdominal pain: Secondary | ICD-10-CM | POA: Diagnosis present

## 2024-08-27 DIAGNOSIS — R531 Weakness: Secondary | ICD-10-CM | POA: Diagnosis not present

## 2024-08-27 DIAGNOSIS — R110 Nausea: Secondary | ICD-10-CM | POA: Diagnosis present

## 2024-08-27 DIAGNOSIS — R918 Other nonspecific abnormal finding of lung field: Secondary | ICD-10-CM | POA: Insufficient documentation

## 2024-08-27 DIAGNOSIS — I700 Atherosclerosis of aorta: Secondary | ICD-10-CM | POA: Insufficient documentation

## 2024-08-27 DIAGNOSIS — E785 Hyperlipidemia, unspecified: Secondary | ICD-10-CM | POA: Insufficient documentation

## 2024-08-27 LAB — RESP PANEL BY RT-PCR (RSV, FLU A&B, COVID)  RVPGX2
Influenza A by PCR: NEGATIVE
Influenza B by PCR: NEGATIVE
Resp Syncytial Virus by PCR: NEGATIVE
SARS Coronavirus 2 by RT PCR: NEGATIVE

## 2024-08-27 LAB — URINALYSIS, ROUTINE W REFLEX MICROSCOPIC
Bacteria, UA: NONE SEEN
Bilirubin Urine: NEGATIVE
Glucose, UA: NEGATIVE mg/dL
Hgb urine dipstick: NEGATIVE
Ketones, ur: NEGATIVE mg/dL
Nitrite: NEGATIVE
Protein, ur: NEGATIVE mg/dL
Specific Gravity, Urine: 1.015 (ref 1.005–1.030)
pH: 6 (ref 5.0–8.0)

## 2024-08-27 LAB — CBC WITH DIFFERENTIAL/PLATELET
Abs Immature Granulocytes: 0.01 K/uL (ref 0.00–0.07)
Basophils Absolute: 0 K/uL (ref 0.0–0.1)
Basophils Relative: 0 %
Eosinophils Absolute: 0.2 K/uL (ref 0.0–0.5)
Eosinophils Relative: 3 %
HCT: 33.2 % — ABNORMAL LOW (ref 36.0–46.0)
Hemoglobin: 10.4 g/dL — ABNORMAL LOW (ref 12.0–15.0)
Immature Granulocytes: 0 %
Lymphocytes Relative: 22 %
Lymphs Abs: 1 K/uL (ref 0.7–4.0)
MCH: 30.6 pg (ref 26.0–34.0)
MCHC: 31.3 g/dL (ref 30.0–36.0)
MCV: 97.6 fL (ref 80.0–100.0)
Monocytes Absolute: 0.5 K/uL (ref 0.1–1.0)
Monocytes Relative: 12 %
Neutro Abs: 2.9 K/uL (ref 1.7–7.7)
Neutrophils Relative %: 63 %
Platelets: 271 K/uL (ref 150–400)
RBC: 3.4 MIL/uL — ABNORMAL LOW (ref 3.87–5.11)
RDW: 14.5 % (ref 11.5–15.5)
WBC: 4.7 K/uL (ref 4.0–10.5)
nRBC: 0 % (ref 0.0–0.2)

## 2024-08-27 LAB — COMPREHENSIVE METABOLIC PANEL WITH GFR
ALT: 6 U/L (ref 0–44)
AST: 18 U/L (ref 15–41)
Albumin: 4.1 g/dL (ref 3.5–5.0)
Alkaline Phosphatase: 61 U/L (ref 38–126)
Anion gap: 8 (ref 5–15)
BUN: 14 mg/dL (ref 8–23)
CO2: 27 mmol/L (ref 22–32)
Calcium: 9.5 mg/dL (ref 8.9–10.3)
Chloride: 104 mmol/L (ref 98–111)
Creatinine, Ser: 0.83 mg/dL (ref 0.44–1.00)
GFR, Estimated: >60 mL/min (ref >60)
Glucose, Bld: 75 mg/dL (ref 70–99)
Potassium: 3.5 mmol/L (ref 3.5–5.1)
Sodium: 139 mmol/L (ref 135–145)
Total Bilirubin: <0.2 mg/dL (ref 0.0–1.2)
Total Protein: 6.9 g/dL (ref 6.5–8.1)

## 2024-08-27 LAB — TROPONIN T, HIGH SENSITIVITY: Troponin T High Sensitivity: <15 ng/L (ref 0–19)

## 2024-08-27 LAB — LACTIC ACID, PLASMA: Lactic Acid, Venous: 0.8 mmol/L (ref 0.5–1.9)

## 2024-08-27 MED ORDER — IOHEXOL 300 MG/ML  SOLN
100.0000 mL | Freq: Once | INTRAMUSCULAR | Status: AC | PRN
  Administered 2024-08-27: 100 mL via INTRAVENOUS

## 2024-08-27 MED ORDER — SODIUM CHLORIDE 0.9 % IV BOLUS
500.0000 mL | Freq: Once | INTRAVENOUS | Status: AC
  Administered 2024-08-27: 500 mL via INTRAVENOUS

## 2024-08-27 NOTE — ED Triage Notes (Signed)
 Pt BIB [facility] for ongoing weakness, dizziness, abd pain. Pt denies thinners. Pt says she wakes up weak and stays weak all day.

## 2024-08-27 NOTE — Discharge Instructions (Signed)
 Please follow-up closely with your primary care doctor on an outpatient basis.  Return to emergency department immediately for any new or worsening symptoms.

## 2024-08-27 NOTE — Consult Note (Signed)
 Initial Consultation Note   Patient: Melody Mitchell FMW:[id number] DOB: [birth year] PCP: [facility] DOA: 08/27/2024 DOS: the patient was seen and examined on 08/27/2024 Primary service: Pearlean Tully BRAVO, MD  Referring physician: EDP- Lonni Conger  Reason for consult: Weakness.  Assessment/Plan:  Generalized weakness-over the past 2 days.  UA with small leukocytes, but she denies any urinary symptoms whatsoever.  She is afebrile, no leukocytosis.  Hgb 10.4, baseline 11-12. Reports abdominal pain-which has resolved, no melena or hematochezia. Chest imaging unremarkable except for small ill-defined opacity-right lung, nonemergent CT recommended.    - CT abdomen and pelvis negative for acute abnormality.   - She is well-appearing and is comfortable going home.  Mental status is intact. - 500 mL bolus given. - At this time, patient is not meeting criteria for hospitalization.  Will recommend ambulating patient in the ED.  She can follow-up as outpatient with her providers.  EP will determine disposition.  Lung lesion - Follow-up as outpatient. - Problem list and overview updated    TRH will sign off at present, please call us  again when needed.  HPI: Melody Mitchell is a 83 y.o. female with past medical history of hypertension, hypothyroid stroke, AV malformation. Patient was brought to the ED via EMS with reports of generalized weakness, dizziness and abdominal pain.  She reports weakness over the past 2 days.  She reports some mild abdominal pain that has resolved on my evaluation.  Denies any black stools, no blood in stools.  She denies dysuria, no change in urinary or bowel habits.  At baseline she does not eat a lot, but her oral intake is unchanged.  She lives alone in an apartment.  Ambulates with a cane, her sister and friends check on her.  No vomiting no diarrhea. No fevers no chills.  No focal weakness of her extremities.  My evaluation abdominal pain  has resolved.  She is currently not dizzy but has not ambulated in ED.  ED course-stable vitals.  COVID influenza RSV negative.  UA with small leukocytes.  Troponin less than 15.  Chest x-ray shows a small ill-defined opacity along the medial aspect of the right apex and right paratracheal region-nonemergent chest CT recommended to exclude the presence of underlying mass. CT abdomen and pelvis with contrast-no acute findings in abdomen or pelvis.  Diverticulosis without acute diverticulitis. Head CT negative for acute abnormality. 500 mL bolus given.   Review of Systems: As mentioned in the history of present illness. All other systems reviewed and are negative.  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 four colon polyps removed. No source  for change in bowel habits identified 3. Rectal bleeding due to large internal hemorrhoids    COLONOSCOPY  W/ BIOPSIES  6 2010   Dr. harvey: Polypoid appearing lesion of the ascending colon, 3 mm sessile rectal polyp, small internal hemorrhoids. Pathology revealed polypoid mucosa, no adenomatous changes   Double-balloon enteroscopy, antegrade  April 2011   Dr. Myriam: Multiple duodenal and jejunal angiectasia is treated with APC.   ENTEROSCOPY N/A 06/02/2022   Procedure: ENTEROSCOPY;  Surgeon: Shaaron Lamar HERO, MD;  [facility];  Service: Endoscopy;   Laterality: N/A;  with pediatric colonoscope   ESOPHAGOGASTRODUODENOSCOPY  04/2009   Dr. harvey: Patent Schatzki ring dilated with advancing the scope, patchy erythema in the antrum, 2 small AVMs in the duodenal bulb, 2 additional AVMs noted in the second portion the duodenum, mild gastritis on pathology   ESOPHAGOGASTRODUODENOSCOPY N/A 06/29/2015   SLF: 1. 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 small hiatal hernia 3. Mild non-erosive gastririts- NO obvious source for dyspepsia identified.    ESOPHAGOGASTRODUODENOSCOPY N/A 03/13/2020   Fields: Low-grade narrowing Schatzki ring, small hiatal hernia, multiple nonbleeding angiectasia's in the jejunum treated with APC.  Spot tattoo in jejunum at 90 cm from the incisors.   ESOPHAGOGASTRODUODENOSCOPY N/A 06/02/2022   Procedure: ESOPHAGOGASTRODUODENOSCOPY (EGD);  Surgeon: Shaaron Lamar HERO, MD;  [facility];  Service: Endoscopy;  Laterality: N/A;   EYE SURGERY     HOT HEMOSTASIS  06/02/2022   Procedure: HOT HEMOSTASIS (ARGON PLASMA COAGULATION/BICAP);  Surgeon: Shaaron Lamar HERO, MD;  [facility];  Service: Endoscopy;;   Social History:  reports that she quit smoking about 16 years ago. Her smoking use included cigarettes. She started smoking about 41 years ago. She has a 25 pack-year smoking history. She has never used smokeless tobacco. She reports that she does not currently use alcohol . She reports that she does not use drugs.  Allergies  Allergen Reactions   Latex Hives, Itching and Rash   Sulfa Antibiotics Hives and Itching    Other reaction(s): hives   Other Hives   Pravastatin Sodium Other (See Comments)    Family History  Problem Relation Age of Onset   Heart failure Mother    Diabetes Sister    Heart attack Father    Colon cancer Neg Hx    Gastric cancer Neg Hx    Esophageal cancer Neg Hx     Prior to Admission medications   Medication Sig Start Date End Date Taking? Authorizing Provider  acetaminophen   (TYLENOL ) 500 MG tablet Take 1,000 mg by mouth every 6 (six) hours as needed for moderate pain.    [provider]  atorvastatin  (LIPITOR) 40 MG tablet Take 40 mg by mouth daily. 08/10/22   [provider]  Cyanocobalamin  (VITAMIN B-12 CR) 1000 MCG TBCR Take 1 tablet (1,000 mcg total) by mouth daily. 03/05/24   Lamon Pleasant HERO, PA-C  escitalopram  (LEXAPRO ) 10 MG tablet Take 10 mg by mouth daily. 08/10/22   [provider]  ezetimibe  (ZETIA ) 10 MG tablet Take 10 mg by mouth daily. 08/10/22   [provider]  ferrous gluconate  (FERGON) 324 MG tablet Take 1 tablet (324 mg total) by mouth at bedtime. 06/12/24   Geofm Delon BRAVO, NP  mirtazapine  (REMERON  SOL-TAB) 30 MG disintegrating tablet Take 30 mg by mouth at bedtime. 02/13/24   [provider]  ondansetron  (ZOFRAN -ODT) 4 MG disintegrating tablet Take 1 tablet (4 mg total) by mouth every 8 (eight) hours as needed. Patient not taking: Reported on 06/12/2024 03/10/24   Yolande Lamar BROCKS, MD  pantoprazole  (PROTONIX ) 40 MG  tablet Take 40 mg by mouth 2 (two) times daily. 08/10/22   [provider]  potassium chloride  SA (KLOR-CON  M) 20 MEQ tablet Take 20 mEq by mouth daily. 08/10/22   [provider]    Physical Exam: Vitals:   08/27/24 1438 08/27/24 1440  BP: 112/68   Pulse: 60   Resp: 18   Temp: 98.6 F (37 C)   TempSrc: Oral   SpO2: 99%   Weight:  50 kg    Constitutional: NAD, calm, comfortable Eyes: PERRL, lids and conjunctivae normal ENMT: Mucous membranes are moist.   Neck: normal, supple, no masses, no thyromegaly Respiratory: clear to auscultation bilaterally, no wheezing, no crackles. Normal respiratory effort. No accessory muscle use.  Cardiovascular: Regular rate and rhythm, no murmurs / rubs / gallops.  Trace bilateral lower extremity edema, at baseline. Abdomen: no tenderness, no masses palpated. No hepatosplenomegaly. Bowel sounds positive.  Musculoskeletal: no  clubbing / cyanosis. No joint deformity upper and lower extremities.  Skin: no rashes, lesions, ulcers. No induration Neurologic: No facial asymmetry, moves extremities spontaneously, speech fluent.  Psychiatric: Normal judgment and insight.  Awake and alert oriented to person time situation.  She tells me she is at [facility], she tells me this is because she goes there a lot.  Appropriate mood.  Data Reviewed:  COVID influenza RSV negative. WBC 4.7. Hgb 10.4 UA with small leukocytes, no bacteria. Troponin less than 15.   Family Communication: None at bedside  Primary team communication: YES Thank you very much for involving us  in the care of your patient.  Author: Tully FORBES Carwin, MD 08/27/2024 8:17 PM  For on call review [email or web address].

## 2024-08-27 NOTE — ED Provider Notes (Signed)
 [place] EMERGENCY DEPARTMENT AT [facility] Provider Note   CSN: [id number] Arrival date & time: 08/27/24  1430     Patient presents with: Weakness   Melody Mitchell is a 83 y.o. female.   Patient is an 83 year old female who presents to the emergency department with a chief complaint of generalized weakness which has been ongoing for approximate the past 2 days.  She was noted to have some intermittent abdominal pain with associated nausea.  She has had no associated diarrhea or constipation.  She denies any active dysuria or hematuria but does admit to some pain to her lower back.  She denies any abnormal headaches, pain to neck, abnormal rashes.  She denies any chest pain or shortness of breath.  She has had no associated cough, congestion, rhinorrhea, sore throat.  There is been no associated dizziness, lightheadedness or syncope.   Weakness      Prior to Admission medications   Medication Sig Start Date End Date Taking? Authorizing Provider  acetaminophen  (TYLENOL ) 500 MG tablet Take 1,000 mg by mouth every 6 (six) hours as needed for moderate pain.    [provider]  atorvastatin  (LIPITOR) 40 MG tablet Take 40 mg by mouth daily. 08/10/22   [provider]  Cyanocobalamin  (VITAMIN B-12 CR) 1000 MCG TBCR Take 1 tablet (1,000 mcg total) by mouth daily. 03/05/24   Lamon Pleasant HERO, PA-C  escitalopram  (LEXAPRO ) 10 MG tablet Take 10 mg by mouth daily. 08/10/22   [provider]  ezetimibe  (ZETIA ) 10 MG tablet Take 10 mg by mouth daily. 08/10/22   [provider]  ferrous gluconate  (FERGON) 324 MG tablet Take 1 tablet (324 mg total) by mouth at bedtime. 06/12/24   Geofm Delon BRAVO, NP  mirtazapine  (REMERON  SOL-TAB) 30 MG disintegrating tablet Take 30 mg by mouth at bedtime. 02/13/24   [provider]  ondansetron  (ZOFRAN -ODT) 4 MG disintegrating tablet Take 1 tablet (4 mg total) by mouth every 8 (eight) hours as needed. Patient  not taking: Reported on 06/12/2024 03/10/24   Yolande Lamar BROCKS, MD  pantoprazole  (PROTONIX ) 40 MG tablet Take 40 mg by mouth 2 (two) times daily. 08/10/22   [provider]  potassium chloride  SA (KLOR-CON  M) 20 MEQ tablet Take 20 mEq by mouth daily. 08/10/22   [provider]    Allergies: Latex, Sulfa antibiotics, Other, and Pravastatin sodium    Review of Systems  Neurological:  Positive for weakness.  All other systems reviewed and are negative.   Updated Vital Signs BP 112/68 (BP Location: Left Arm)   Pulse 60   Temp 98.6 F (37 C) (Oral)   Resp 18   Wt 50 kg   SpO2 99%   BMI 18.92 kg/m   Physical Exam Nursing note reviewed.  Constitutional:      General: She is not in acute distress.    Appearance: Normal appearance. She is not ill-appearing.  HENT:     Head: Normocephalic and atraumatic.     Nose: Nose normal.     Mouth/Throat:     Mouth: Mucous membranes are moist.  Eyes:     Extraocular Movements: Extraocular movements intact.     Conjunctiva/sclera: Conjunctivae normal.     Pupils: Pupils are equal, round, and reactive to light.  Cardiovascular:     Rate and Rhythm: Normal rate and regular rhythm.     Pulses: Normal pulses.     Heart sounds: Normal heart sounds. No murmur heard.  No gallop.  Pulmonary:     Effort: Pulmonary effort is normal. No respiratory distress.     Breath sounds: Normal breath sounds. No stridor. No wheezing, rhonchi or rales.  Abdominal:     General: Abdomen is flat. Bowel sounds are normal. There is no distension.     Palpations: Abdomen is soft.     Tenderness: There is no abdominal tenderness. There is no guarding.  Musculoskeletal:        General: No swelling, tenderness, deformity or signs of injury. Normal range of motion.     Cervical back: Normal range of motion and neck supple. No rigidity or tenderness.  Skin:    General: Skin is warm and dry.     Coloration: Skin is not jaundiced.     Findings: No  bruising or rash.  Neurological:     General: No focal deficit present.     Mental Status: She is alert and oriented to person, place, and time. Mental status is at baseline.     Cranial Nerves: No cranial nerve deficit.     Sensory: No sensory deficit.     Motor: No weakness.     Coordination: Coordination normal.  Psychiatric:        Mood and Affect: Mood normal.        Behavior: Behavior normal.        Thought Content: Thought content normal.        Judgment: Judgment normal.     (all labs ordered are listed, but only abnormal results are displayed) Labs Reviewed  RESP PANEL BY RT-PCR (RSV, FLU A&B, COVID)  [id number]  COMPREHENSIVE METABOLIC PANEL WITH GFR  CBC WITH DIFFERENTIAL/PLATELET  URINALYSIS, ROUTINE W REFLEX MICROSCOPIC  LACTIC ACID, PLASMA  LACTIC ACID, PLASMA  TROPONIN T, HIGH SENSITIVITY    EKG: None  Radiology: No results found.   Procedures   Medications Ordered in the ED  sodium chloride  0.9 % bolus 500 mL (has no administration in time range)                                    Medical Decision Making Amount and/or Complexity of Data Reviewed Labs: ordered. Radiology: ordered.  Risk Prescription drug management. Decision regarding hospitalization.   This patient presents to the ED for concern of weakness, confusion, this involves an extensive number of treatment options, and is a complaint that carries with it a high risk of complications and morbidity.  The differential diagnosis includes metabolic encephalopathy, acute kidney injury, dehydration, urinary tract infection, sepsis, pneumonia   Co morbidities that complicate the patient evaluation  Hyperlipidemia, CVA   Additional history obtained:  Additional history obtained from medical records External records from outside source obtained and reviewed including medical records   Lab Tests:  I Ordered, and personally interpreted labs.  The pertinent results include: No leukocytosis,  anemia at baseline, normal kidney function liver function, normal electrolytes, negative lactic acid, unremarkable urinalysis, negative viral swab, negative troponin   Imaging Studies ordered:  I ordered imaging studies including CT scan head, chest x-ray, CT scan abdomen and pelvis I independently visualized and interpreted imaging which showed no acute intracranial hemorrhage, no acute cardiopulmonary process, no acute intra-abdominal process I agree with the radiologist interpretation   Cardiac Monitoring: / EKG:  The patient was maintained on a cardiac monitor.  I personally viewed and interpreted the cardiac monitored which showed an underlying rhythm  of: Sinus bradycardia, no ST/T wave changes, no ischemic changes, no STEMI    Problem List / ED Course / Critical interventions / Medication management  Patient is doing very well at this time.  I did initially discuss the patient case with the hospitalist Dr. FORBES Carwin who did full evaluate the patient and does state that the patient may be discharged home and does not warrant admission at this point.  All workup in the emergency department has been overall unremarkable.  Unclear of the exact cause of her weakness at this time but admission is not warranted.  Patient has no indication for sepsis and no obvious indication for underlying infectious source.  She was made aware of the findings on her chest x-ray and the need for close follow-up with her primary care doctor.  Strict return precautions were discussed for any new or worsening symptoms.  Patient voiced understanding and had no additional questions. I ordered medication including IV fluids for weakness Reevaluation of the patient after these medicines showed that the patient improved I have reviewed the patients home medicines and have made adjustments as needed   Social Determinants of Health:  None   Test / Admission - Considered:  Admission     Final diagnoses:  None     ED Discharge Orders     None          Daralene Lonni JONETTA DEVONNA 08/27/24 1901    Dean Clarity, MD 08/28/24 1121

## 2024-08-27 NOTE — ED Notes (Signed)
 Update given to Legal Guardian at [facility]. Legal Guardian request we contact her prior to sending patient home.

## 2024-09-05 ENCOUNTER — Inpatient Hospital Stay: Attending: Hematology

## 2024-09-05 DIAGNOSIS — D500 Iron deficiency anemia secondary to blood loss (chronic): Secondary | ICD-10-CM | POA: Insufficient documentation

## 2024-09-05 DIAGNOSIS — E538 Deficiency of other specified B group vitamins: Secondary | ICD-10-CM | POA: Insufficient documentation

## 2024-09-05 DIAGNOSIS — K5520 Angiodysplasia of colon without hemorrhage: Secondary | ICD-10-CM | POA: Insufficient documentation

## 2024-09-05 DIAGNOSIS — K909 Intestinal malabsorption, unspecified: Secondary | ICD-10-CM | POA: Insufficient documentation

## 2024-09-05 DIAGNOSIS — T452X1A Poisoning by vitamins, accidental (unintentional), initial encounter: Secondary | ICD-10-CM

## 2024-09-05 LAB — VITAMIN D 25 HYDROXY (VIT D DEFICIENCY, FRACTURES): Vit D, 25-Hydroxy: 42.86 ng/mL (ref 30–100)

## 2024-09-05 LAB — CBC WITH DIFFERENTIAL/PLATELET
Abs Immature Granulocytes: 0.01 K/uL (ref 0.00–0.07)
Basophils Absolute: 0 K/uL (ref 0.0–0.1)
Basophils Relative: 0 %
Eosinophils Absolute: 0.1 K/uL (ref 0.0–0.5)
Eosinophils Relative: 2 %
HCT: 35.7 % — ABNORMAL LOW (ref 36.0–46.0)
Hemoglobin: 10.9 g/dL — ABNORMAL LOW (ref 12.0–15.0)
Immature Granulocytes: 0 %
Lymphocytes Relative: 14 %
Lymphs Abs: 0.8 K/uL (ref 0.7–4.0)
MCH: 29.6 pg (ref 26.0–34.0)
MCHC: 30.5 g/dL (ref 30.0–36.0)
MCV: 97 fL (ref 80.0–100.0)
Monocytes Absolute: 0.6 K/uL (ref 0.1–1.0)
Monocytes Relative: 10 %
Neutro Abs: 4.1 K/uL (ref 1.7–7.7)
Neutrophils Relative %: 74 %
Platelets: 283 K/uL (ref 150–400)
RBC: 3.68 MIL/uL — ABNORMAL LOW (ref 3.87–5.11)
RDW: 14.2 % (ref 11.5–15.5)
WBC: 5.6 K/uL (ref 4.0–10.5)
nRBC: 0 % (ref 0.0–0.2)

## 2024-09-05 LAB — IRON AND TIBC
Iron: 39 ug/dL (ref 28–170)
Saturation Ratios: 14 % (ref 10.4–31.8)
TIBC: 279 ug/dL (ref 250–450)
UIBC: 240 ug/dL

## 2024-09-05 LAB — COMPREHENSIVE METABOLIC PANEL WITH GFR
ALT: 9 U/L (ref 0–44)
AST: 20 U/L (ref 15–41)
Albumin: 4.3 g/dL (ref 3.5–5.0)
Alkaline Phosphatase: 65 U/L (ref 38–126)
Anion gap: 9 (ref 5–15)
BUN: 10 mg/dL (ref 8–23)
CO2: 28 mmol/L (ref 22–32)
Calcium: 9.2 mg/dL (ref 8.9–10.3)
Chloride: 106 mmol/L (ref 98–111)
Creatinine, Ser: 0.82 mg/dL (ref 0.44–1.00)
GFR, Estimated: >60 mL/min (ref >60)
Glucose, Bld: 80 mg/dL (ref 70–99)
Potassium: 4.4 mmol/L (ref 3.5–5.1)
Sodium: 143 mmol/L (ref 135–145)
Total Bilirubin: 0.2 mg/dL (ref 0.0–1.2)
Total Protein: 7.1 g/dL (ref 6.5–8.1)

## 2024-09-05 LAB — VITAMIN B12: Vitamin B-12: 762 pg/mL (ref 180–914)

## 2024-09-05 LAB — FERRITIN: Ferritin: 94 ng/mL (ref 11–307)

## 2024-09-12 ENCOUNTER — Inpatient Hospital Stay: Admitting: Oncology

## 2024-09-12 VITALS — BP 109/63 | HR 58 | Temp 97.5°F | Resp 17 | Ht 64.0 in | Wt 117.0 lb

## 2024-09-12 DIAGNOSIS — D500 Iron deficiency anemia secondary to blood loss (chronic): Secondary | ICD-10-CM

## 2024-09-12 DIAGNOSIS — E538 Deficiency of other specified B group vitamins: Secondary | ICD-10-CM | POA: Diagnosis not present

## 2024-09-12 LAB — METHYLMALONIC ACID, SERUM: Methylmalonic Acid, Quantitative: 112 nmol/L (ref 0–378)

## 2024-09-12 MED ORDER — VITAMIN C 250 MG PO TABS: 250.0000 mg | ORAL_TABLET | Freq: Every day | ORAL | 3 refills | Status: AC

## 2024-09-12 MED ORDER — FERROUS GLUCONATE 324 (38 FE) MG PO TABS: 324.0000 mg | ORAL_TABLET | Freq: Every day | ORAL | 3 refills | Status: DC

## 2024-09-12 MED ORDER — POLYETHYLENE GLYCOL 3350 17 G PO PACK: PACK | ORAL | 10 refills | Status: AC

## 2024-09-12 NOTE — Progress Notes (Signed)
 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 -No recent colonoscopy/EGD. - SPEP negative, free light chain ratio mildly elevated at 1.70 with 28.1 kappa free light chains; LDH normal.  Renal function normal for age. -Receives IV iron intermittently last given on 06/21/2024.  Tolerates well. - Most recent labs 09/05/2024 show hemoglobin of 10.9 (10.4), ferritin 94 and iron panel 14%.  Normal TIBC. - She does not recall the last time that she noticed any melanotic stool. - She is followed by the GI providers at [facility] -Patient has never tried oral iron.  We discussed ferrous gluconate  324 mg every night before bedtime at least 6 hours from her Protonix .  She may take with vitamin C or orange juice to increase absorption.  We discussed side effects of oral iron including constipation and GI upset.  May take a stool softener as needed for constipation. -Return to clinic in 3 months with labs a few days before. B12 deficiency Continue B12 supplements. Repeat labs from 09/05/2024 show improvement of B12 level.  Orders Placed This Encounter  Procedures   CBC with Differential/Platelet    Standing Status:   Future    Expected Date:   12/06/2024    Expiration Date:   03/13/2025    Release to patient:   Immediate   Comprehensive metabolic panel    Standing Status:   Future    Expected Date:   12/06/2024    Expiration Date:   03/13/2025   Methylmalonic acid, serum    Standing Status:   Future    Expected Date:   12/06/2024    Expiration Date:   03/13/2025   VITAMIN D  25 Hydroxy (Vit-D Deficiency, Fractures)    Standing Status:   Future    Expected Date:    12/06/2024    Expiration Date:   03/13/2025   Vitamin B12    Standing Status:   Future    Expected Date:   12/06/2024    Expiration Date:   03/13/2025   Iron and TIBC    Standing Status:   Future    Expected Date:   12/06/2024    Expiration Date:   03/13/2025    Release to patient:   Immediate   Ferritin    Standing Status:   Future    Expected Date:   12/06/2024    Expiration Date:   03/13/2025    Release to patient:   Immediate    INTERVAL HISTORY: Ms. Melody Mitchello. female returns for routine follow-up of iron deficiency anemia.  She receives intermittent IV iron last given on 06/21/2024 with great tolerance.  She denies any bright red blood per rectum, melena or hematochezia.  Patient was moved to a new senior living facility-[facility].  She appears to be doing well.   At today's visit, she reports feeling fairly well.  She presents today with her child psychotherapist.  Denies any recent hospitalizations, surgeries or changes to baseline health.    Reports persistent fatigue and headache although not as bad as it was before. She denies any pica, lightheadedness, chest pain, dyspnea on exertion, or syncope.  She has not noticed any recent black bowel movements  or melanotic stool that she can recall.  She denies any other bleeding such as epistaxis, hematemesis, hematuria, or hematochezia.   She reports 50% energy and 50% appetite.  Her weight has been stable since her last visit.  We reviewed CBC, CMP, MMA, vitamin D , B12, iron panel and ferritin.  SUMMARY OF HEMATOLOGIC HISTORY: Oncology History   No history exists.     CBC    Component Value Date/Time   WBC 5.6 09/05/2024 1029   RBC 3.68 (L) 09/05/2024 1029   HGB 10.9 (L) 09/05/2024 1029   HCT 35.7 (L) 09/05/2024 1029   PLT 283 09/05/2024 1029   MCV 97.0 09/05/2024 1029   MCH 29.6 09/05/2024 1029   MCHC 30.5 09/05/2024 1029   RDW 14.2 09/05/2024 1029   LYMPHSABS 0.8 09/05/2024 1029   MONOABS 0.6 09/05/2024 1029    EOSABS 0.1 09/05/2024 1029   BASOSABS 0.0 09/05/2024 1029       Latest Ref Rng & Units 09/05/2024   10:29 AM 08/27/2024    3:23 PM 06/05/2024    9:24 AM  CMP  Glucose 70 - 99 mg/dL 80  75  94   BUN 8 - 23 mg/dL 10  14  7    Creatinine 0.44 - 1.00 mg/dL 9.17  9.16  9.23   Sodium 135 - 145 mmol/L 143  139  142   Potassium 3.5 - 5.1 mmol/L 4.4  3.5  3.3   Chloride 98 - 111 mmol/L 106  104  106   CO2 22 - 32 mmol/L 28  27  26    Calcium  8.9 - 10.3 mg/dL 9.2  9.5  9.3   Total Protein 6.5 - 8.1 g/dL 7.1  6.9  6.8   Total Bilirubin 0.0 - 1.2 mg/dL 0.2  <9.7  0.4   Alkaline Phos 38 - 126 U/L 65  61  56   AST 15 - 41 U/L 20  18  15    ALT 0 - 44 U/L 9  6  8       Lab Results  Component Value Date   FERRITIN 94 09/05/2024   VITAMINB12 762 09/05/2024    Vitals:   09/12/24 1021  BP: 109/63  Pulse: (!) 58  Resp: 17  Temp: (!) 97.5 F (36.4 C)  SpO2: 99%    Review of System:  Review of Systems  Constitutional:  Positive for malaise/fatigue.  Neurological:  Positive for headaches.    Physical Exam: Physical Exam Constitutional:      Appearance: Normal appearance.  HENT:     Head: Normocephalic and atraumatic.  Eyes:     Pupils: Pupils are equal, round, and reactive to light.  Cardiovascular:     Rate and Rhythm: Normal rate and regular rhythm.     Heart sounds: Normal heart sounds. No murmur heard. Pulmonary:     Effort: Pulmonary effort is normal.     Breath sounds: Normal breath sounds. No wheezing.  Abdominal:     General: Bowel sounds are normal. There is no distension.     Palpations: Abdomen is soft.     Tenderness: There is no abdominal tenderness.  Musculoskeletal:        General: Normal range of motion.     Cervical back: Normal range of motion.  Skin:    General: Skin is warm and dry.     Findings: No rash.  Neurological:     Mental Status: She is alert and oriented to person, place, and time.  Gait: Gait is intact.  Psychiatric:        Mood and  Affect: Mood and affect normal.        Cognition and Memory: Memory normal.        Judgment: Judgment normal.      I spent 20 minutes dedicated to the care of this patient (face-to-face and non-face-to-face) on the date of the encounter to include what is described in the assessment and plan.,  Delon Hope, NP 09/12/2024 3:12 PM

## 2024-09-12 NOTE — Assessment & Plan Note (Addendum)
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 -No recent colonoscopy/EGD. - SPEP negative, free light chain ratio mildly elevated at 1.70 with 28.1 kappa free light chains; LDH normal.  Renal function normal for age. -Receives IV iron intermittently last given on 06/21/2024.  Tolerates well. - Most recent labs 09/05/2024 show hemoglobin of 10.9 (10.4), ferritin 94 and iron panel 14%.  Normal TIBC. - She does not recall the last time that she noticed any melanotic stool. - She is followed by the GI providers at [facility] -Patient has never tried oral iron.  We discussed ferrous gluconate  324 mg every night before bedtime at least 6 hours from her Protonix .  She may take with vitamin C or orange juice to increase absorption.  We discussed side effects of oral iron including constipation and GI upset.  May take a stool softener as needed for constipation. -Return to clinic in 3 months with labs a few days before.

## 2024-11-11 ENCOUNTER — Encounter: Payer: Self-pay | Admitting: *Deleted

## 2024-12-03 NOTE — Progress Notes (Unsigned)
 "  [facility] [address], [place] [address]   CLINIC:  Medical Oncology/Hematology  PCP:  [facility] [address] [place] [address] [phone]   REASON FOR VISIT:  Follow-up for iron deficiency anemia   CURRENT THERAPY: Intermittent IV iron infusions    INTERVAL HISTORY:   Melody Mitchell 84 y.o. female returns for routine follow-up of iron deficiency anemia.   She was last seen by NP Delon Hope on 09/12/2024. Her last IV iron was [name]  x 1 on 06/21/2024. She is currently a resident at [facility] in [place].   She is seen today with her state appointed legal guardian, Arland.   At today's visit, she reports feeling fair. She denies any recent hospitalizations, surgeries, or changes in her baseline health status. She reports 50% energy and 75% appetite.   She reports a poor appetite for the past few months, but her weight has been stable since her last visit. She has been taking ferrous gluconate  nightly for the past 3 months, tolerating this well.   She reports increased fatigue and headaches. She denies any pica, lightheadedness, chest pain, dyspnea on exertion, or syncope. She has not noticed any recent black bowel movements or melanotic stool that she can recall. She denies any other bleeding such as epistaxis, hematemesis, hematuria, or hematochezia.  ASSESSMENT & PLAN:  1.  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  She presented to ED with weakness and fatigue, found to be hemoccult positive with Hgb 8.3 (dropped from 10.0 two weeks prior).  She received 1 unit PRBC.   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  Renal function normal for age. - Last [name]  x 1 in August 2025 - She is taking ferrous gluconate  daily since October 2025.   - Most recent labs (12/04/2024): Hgb 11.0/MCV 95.0 Ferritin 21, iron saturation 21% Creatinine 0.90/GFR >60 - She does not recall the last time that she noticed any melanotic stool - She is symptomatic with fatigue      - She is followed by the GI providers at [facility] - PLAN: Recommend IV [name]  x 2 - Continue daily ferrous gluconate  as tolerated.  Can decrease to every other day if experiencing constipation. - Repeat labs and RTC in 3 months   - Follow-up with GI   2.  Vitamin D  deficiency, with subsequent overload - Patient previously required vitamin D  supplementation, but was mistakenly taking this too frequently and did not stop when instructed - Vitamin D  peaked at 193.00 on 05/03/2022 - Most recent labs (08/16/2024) showed normal vitamin D42.86.  Labs from today are PENDING. - PLAN: No vitamin D  supplement at this time, but labs from today are PENDING.  If vitamin D  is < 30, we will call and start her back on lower dose supplementation. - As long as levels are normal, we will recheck vitamin D  level in about a year (January 2027).  3.  Borderline vitamin B12 deficiency: - Labs on 05/05/2023 show vitamin B12 at 655 with normal methylmalonic acid - She is taking vitamin B12 1000 mcg  daily - Most recent labs (12/04/2024) show elevated vitamin B12 at 929. - PLAN: DECREASE vitamin B12 supplement to take every OTHER day. - We will check B12 and and MMA at follow-up in 3 months.  4.  Poor appetite and weight loss    - Reports decreased appetite for the past several months  - She has noted to have increasing memory deficits, and there is some concern that she may be forgetting to eat - Weight today (12/04/2024) improved compared to weight 3 months  ago - PLAN: Patient instructed to continue drinking 1-2 Ensure/Boost beverages daily. - We will check weight at follow-up appointment    PLAN SUMMARY:  >> IV [name]  x 2 >> Labs in 3 months = CBC/D, CMP, ferritin, iron/TIBC, B12, MMA >> OFFICE visit in 3 months (1 week after labs)     REVIEW OF SYSTEMS:   Review of Systems  Constitutional:  Positive for appetite change and fatigue. Negative for chills, diaphoresis, fever and unexpected weight change.  HENT:   Negative for lump/mass and nosebleeds.   Eyes:  Negative for eye problems.  Respiratory:  Negative for cough, hemoptysis and shortness of breath.   Cardiovascular:  Negative for chest pain, leg swelling and palpitations.  Gastrointestinal:  Positive for constipation and diarrhea. Negative for abdominal pain, blood in stool, nausea and vomiting.  Genitourinary:  Negative for hematuria.   Musculoskeletal:  Positive for arthralgias.  Skin: Negative.   Neurological:  Positive for headaches. Negative for dizziness and light-headedness.  Hematological:  Does not bruise/bleed easily.     PHYSICAL EXAM:  ECOG PERFORMANCE STATUS: 2 - Symptomatic, <50% confined to bed  Vitals:   12/04/24 1319  BP: 130/82  Pulse: 64  Resp: 16  Temp: 99.2 F (37.3 C)  SpO2: 100%    Filed Weights   12/04/24 1319  Weight: 130 lb 1.1 oz (59 kg)    Physical Exam Constitutional:      Appearance: Normal appearance. She is normal weight.  Cardiovascular:     Rate and Rhythm: Normal rate and regular rhythm.     Heart sounds: Normal heart sounds.  Pulmonary:     Breath sounds: Normal breath sounds.  Neurological:     General: No focal deficit present.     Mental Status: Mental status is at baseline.  Psychiatric:        Behavior: Behavior normal. Behavior is cooperative.    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 four colon polyps removed. No source  for change in bowel habits identified 3. Rectal bleeding due to large internal hemorrhoids    COLONOSCOPY W/ BIOPSIES  6 2010   Dr. harvey: Polypoid appearing lesion of the ascending colon, 3 mm sessile rectal  polyp, small internal hemorrhoids. Pathology revealed polypoid mucosa, no adenomatous changes   Double-balloon enteroscopy, antegrade  April 2011   Dr. Myriam: Multiple duodenal and jejunal angiectasia is treated with APC.   ENTEROSCOPY N/A 06/02/2022   Procedure: ENTEROSCOPY;  Surgeon: Shaaron Lamar HERO, MD;  Location: [facility];  Service: Endoscopy;  Laterality: N/A;  with pediatric colonoscope   ESOPHAGOGASTRODUODENOSCOPY  04/2009   Dr. Harvey: Patent Schatzki ring dilated with advancing the scope, patchy erythema in the antrum, 2 small AVMs in the duodenal bulb, 2 additional AVMs noted in the second portion the duodenum, mild gastritis on pathology   ESOPHAGOGASTRODUODENOSCOPY N/A 06/29/2015   SLF: 1. 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 small hiatal hernia 3. Mild non-erosive gastririts- NO obvious source for dyspepsia identified.    ESOPHAGOGASTRODUODENOSCOPY N/A 03/13/2020   Fields: Low-grade narrowing Schatzki ring, small hiatal hernia, multiple nonbleeding angiectasia's in the jejunum treated with APC.  Spot tattoo in jejunum at 90 cm from the incisors.   ESOPHAGOGASTRODUODENOSCOPY N/A 06/02/2022   Procedure:  ESOPHAGOGASTRODUODENOSCOPY (EGD);  Surgeon: Shaaron Lamar HERO, MD;  Location: [facility];  Service: Endoscopy;  Laterality: N/A;   EYE SURGERY     HOT HEMOSTASIS  06/02/2022   Procedure: HOT HEMOSTASIS (ARGON PLASMA COAGULATION/BICAP);  Surgeon: Shaaron Lamar HERO, MD;  Location: [facility];  Service: Endoscopy;;    SOCIAL HISTORY:  Social History   Socioeconomic History   Marital status: Divorced    Spouse name: Not on file   Number of children: 1   Years of education: Not on file   Highest education level: Not on file  Occupational History   Not on file  Tobacco Use   Smoking status: Former    Current packs/day: 0.00    Average packs/day: 1 pack/day for 25.0 years (25.0 ttl pk-yrs)    Types: Cigarettes    Start date: 09/13/1982    Quit date: 09/14/2007    Years since quitting: 17.2   Smokeless tobacco: Never  Vaping Use   Vaping status: Never Used  Substance and Sexual Activity   Alcohol  use: Not Currently    Comment: occasional/rare (Brandy every now and then)   Drug use: No   Sexual activity: Not Currently    Birth control/protection: Post-menopausal, Surgical  Other Topics Concern   Not on file  Social History Narrative   Not on file   Social Drivers of Health   Tobacco Use: Medium Risk (06/21/2024)   Patient History    Smoking Tobacco Use: Former    Smokeless Tobacco Use: Never    Passive Exposure: Not on Actuary Strain: Not on file  Food Insecurity: No Food Insecurity (03/10/2024)   Hunger Vital Sign    Worried About Running Out of Food in the Last Year: Never true    Ran Out of Food in the Last Year: Never true  Transportation Needs: No Transportation Needs (03/10/2024)   PRAPARE - Administrator, Civil Service (Medical): No    Lack of Transportation (Non-Medical): No  Physical Activity: Not on file  Stress: Not on file  Social Connections: Moderately Integrated (03/10/2024)   Social Connection and Isolation Panel    Frequency  of Communication with Friends and Family: More than three times a week    Frequency of Social Gatherings with Friends and Family: Never    Attends Religious Services: 1 to 4 times per year    Active Member of [employer] or Organizations: Yes  Attends Banker Meetings: 1 to 4 times per year    Marital Status: Divorced  Intimate Partner Violence: Not At Risk (03/10/2024)   Humiliation, Afraid, Rape, and Kick questionnaire    Fear of Current or Ex-Partner: No    Emotionally Abused: No    Physically Abused: No    Sexually Abused: No  Depression (PHQ2-9): Low Risk (12/04/2024)   Depression (PHQ2-9)    PHQ-2 Score: 1  Alcohol  Screen: Not on file  Housing: Low Risk (03/10/2024)   Housing Stability Vital Sign    Unable to Pay for Housing in the Last Year: No    Number of Times Moved in the Last Year: 0    Homeless in the Last Year: No  Utilities: Not At Risk (03/10/2024)   AHC Utilities    Threatened with loss of utilities: No  Health Literacy: Not on file    FAMILY HISTORY:  Family History  Problem Relation Age of Onset   Heart failure Mother    Diabetes Sister    Heart attack Father    Colon cancer Neg Hx    Gastric cancer Neg Hx    Esophageal cancer Neg Hx     CURRENT MEDICATIONS:  Outpatient Encounter Medications as of 12/04/2024  Medication Sig   acetaminophen  (TYLENOL ) 500 MG tablet Take 1,000 mg by mouth every 6 (six) hours as needed for moderate pain.   atorvastatin  (LIPITOR) 40 MG tablet Take 40 mg by mouth daily.   Cyanocobalamin  (VITAMIN B-12 CR) 1000 MCG TBCR Take 1 tablet (1,000 mcg total) by mouth daily.   escitalopram  (LEXAPRO ) 10 MG tablet Take 10 mg by mouth daily.   ezetimibe  (ZETIA ) 10 MG tablet Take 10 mg by mouth daily.   ferrous gluconate  (FERGON) 324 MG tablet Take 1 tablet (324 mg total) by mouth daily with breakfast.   fluticasone  (FLONASE ) 50 MCG/ACT nasal spray Place 1 spray into both nostrils daily.   mirtazapine  (REMERON ) 15 MG tablet Take 15  mg by mouth at bedtime.   pantoprazole  (PROTONIX ) 40 MG tablet Take 40 mg by mouth 2 (two) times daily.   polyethylene glycol (MIRALAX ) 17 g packet Use for constipation as needed.   potassium chloride  SA (KLOR-CON  M) 20 MEQ tablet Take 20 mEq by mouth daily.   vitamin C  (ASCORBIC ACID) 250 MG tablet Take 1 tablet (250 mg total) by mouth daily. Take with iron tabs in the morning   mirtazapine  (REMERON  SOL-TAB) 30 MG disintegrating tablet Take 30 mg by mouth at bedtime. (Patient not taking: Reported on 12/04/2024)   Facility-Administered Encounter Medications as of 12/04/2024  Medication   0.9 %  sodium chloride  infusion    ALLERGIES:  Allergies  Allergen Reactions   Latex Hives, Itching and Rash   Pravastatin Sodium Other (See Comments)    Unknown   Sulfa Antibiotics Hives and Itching    LABORATORY DATA:  I have reviewed the labs as listed.  CBC    Component Value Date/Time   WBC 5.7 12/04/2024 1203   RBC 3.81 (L) 12/04/2024 1203   HGB 11.0 (L) 12/04/2024 1203   HCT 36.2 12/04/2024 1203   PLT 270 12/04/2024 1203   MCV 95.0 12/04/2024 1203   MCH 28.9 12/04/2024 1203   MCHC 30.4 12/04/2024 1203   RDW 14.6 12/04/2024 1203   LYMPHSABS 1.0 12/04/2024 1203   MONOABS 0.5 12/04/2024 1203   EOSABS 0.1 12/04/2024 1203   BASOSABS 0.0 12/04/2024 1203      Latest Ref Rng &  Units 12/04/2024   12:03 PM 09/05/2024   10:29 AM 08/27/2024    3:23 PM  CMP  Glucose 70 - 99 mg/dL 875  80  75   BUN 8 - 23 mg/dL 12  10  14    Creatinine 0.44 - 1.00 mg/dL 9.09  9.17  9.16   Sodium 135 - 145 mmol/L 143  143  139   Potassium 3.5 - 5.1 mmol/L 4.1  4.4  3.5   Chloride 98 - 111 mmol/L 108  106  104   CO2 22 - 32 mmol/L 24  28  27    Calcium  8.9 - 10.3 mg/dL 9.1  9.2  9.5   Total Protein 6.5 - 8.1 g/dL 7.2  7.1  6.9   Total Bilirubin 0.0 - 1.2 mg/dL 0.2  0.2  <9.7   Alkaline Phos 38 - 126 U/L 82  65  61   AST 15 - 41 U/L 21  20  18    ALT 0 - 44 U/L 10  9  6      DIAGNOSTIC IMAGING:  I have  independently reviewed the relevant imaging and discussed with the patient.   WRAP UP:  All questions were answered. The patient knows to call the clinic with any problems, questions or concerns.  Medical decision making: Moderate  Time spent on visit: I spent 20 minutes counseling the patient face to face. The total time spent in the appointment was 30 minutes and more than 50% was on counseling.  Pleasant CHRISTELLA Barefoot, PA-C  12/04/24 2:19 PM  "

## 2024-12-04 ENCOUNTER — Inpatient Hospital Stay: Admitting: Oncology

## 2024-12-04 ENCOUNTER — Inpatient Hospital Stay

## 2024-12-04 ENCOUNTER — Inpatient Hospital Stay: Attending: Physician Assistant

## 2024-12-04 ENCOUNTER — Inpatient Hospital Stay: Admitting: Physician Assistant

## 2024-12-04 VITALS — BP 130/82 | HR 64 | Temp 99.2°F | Resp 16 | Wt 130.1 lb

## 2024-12-04 DIAGNOSIS — D500 Iron deficiency anemia secondary to blood loss (chronic): Secondary | ICD-10-CM

## 2024-12-04 DIAGNOSIS — R634 Abnormal weight loss: Secondary | ICD-10-CM | POA: Insufficient documentation

## 2024-12-04 DIAGNOSIS — E559 Vitamin D deficiency, unspecified: Secondary | ICD-10-CM | POA: Diagnosis not present

## 2024-12-04 DIAGNOSIS — E538 Deficiency of other specified B group vitamins: Secondary | ICD-10-CM

## 2024-12-04 DIAGNOSIS — Z87891 Personal history of nicotine dependence: Secondary | ICD-10-CM | POA: Diagnosis not present

## 2024-12-04 DIAGNOSIS — D508 Other iron deficiency anemias: Secondary | ICD-10-CM | POA: Insufficient documentation

## 2024-12-04 DIAGNOSIS — K5520 Angiodysplasia of colon without hemorrhage: Secondary | ICD-10-CM | POA: Diagnosis not present

## 2024-12-04 LAB — CBC WITH DIFFERENTIAL/PLATELET
Abs Immature Granulocytes: 0.02 K/uL (ref 0.00–0.07)
Basophils Absolute: 0 K/uL (ref 0.0–0.1)
Basophils Relative: 0 %
Eosinophils Absolute: 0.1 K/uL (ref 0.0–0.5)
Eosinophils Relative: 2 %
HCT: 36.2 % (ref 36.0–46.0)
Hemoglobin: 11 g/dL — ABNORMAL LOW (ref 12.0–15.0)
Immature Granulocytes: 0 %
Lymphocytes Relative: 17 %
Lymphs Abs: 1 K/uL (ref 0.7–4.0)
MCH: 28.9 pg (ref 26.0–34.0)
MCHC: 30.4 g/dL (ref 30.0–36.0)
MCV: 95 fL (ref 80.0–100.0)
Monocytes Absolute: 0.5 K/uL (ref 0.1–1.0)
Monocytes Relative: 9 %
Neutro Abs: 4.1 K/uL (ref 1.7–7.7)
Neutrophils Relative %: 72 %
Platelets: 270 K/uL (ref 150–400)
RBC: 3.81 MIL/uL — ABNORMAL LOW (ref 3.87–5.11)
RDW: 14.6 % (ref 11.5–15.5)
WBC: 5.7 K/uL (ref 4.0–10.5)
nRBC: 0 % (ref 0.0–0.2)

## 2024-12-04 LAB — COMPREHENSIVE METABOLIC PANEL WITH GFR
ALT: 10 U/L (ref 0–44)
AST: 21 U/L (ref 15–41)
Albumin: 4.2 g/dL (ref 3.5–5.0)
Alkaline Phosphatase: 82 U/L (ref 38–126)
Anion gap: 12 (ref 5–15)
BUN: 12 mg/dL (ref 8–23)
CO2: 24 mmol/L (ref 22–32)
Calcium: 9.1 mg/dL (ref 8.9–10.3)
Chloride: 108 mmol/L (ref 98–111)
Creatinine, Ser: 0.9 mg/dL (ref 0.44–1.00)
GFR, Estimated: >60 mL/min
Glucose, Bld: 124 mg/dL — ABNORMAL HIGH (ref 70–99)
Potassium: 4.1 mmol/L (ref 3.5–5.1)
Sodium: 143 mmol/L (ref 135–145)
Total Bilirubin: 0.2 mg/dL (ref 0.0–1.2)
Total Protein: 7.2 g/dL (ref 6.5–8.1)

## 2024-12-04 LAB — IRON AND TIBC
Iron: 67 ug/dL (ref 28–170)
Saturation Ratios: 21 % (ref 10.4–31.8)
TIBC: 321 ug/dL (ref 250–450)
UIBC: 254 ug/dL

## 2024-12-04 LAB — VITAMIN B12: Vitamin B-12: 929 pg/mL — ABNORMAL HIGH (ref 180–914)

## 2024-12-04 LAB — FERRITIN: Ferritin: 21 ng/mL (ref 11–307)

## 2024-12-04 LAB — VITAMIN D 25 HYDROXY (VIT D DEFICIENCY, FRACTURES): Vit D, 25-Hydroxy: 31.2 ng/mL (ref 30–100)

## 2024-12-04 MED ORDER — FERROUS GLUCONATE 324 (38 FE) MG PO TABS: 324.0000 mg | ORAL_TABLET | Freq: Every day | ORAL | 3 refills | Status: AC

## 2024-12-04 MED ORDER — VITAMIN B-12 ER 1000 MCG PO TBCR: 1.0000 | EXTENDED_RELEASE_TABLET | ORAL | 3 refills | Status: AC

## 2024-12-04 NOTE — Patient Instructions (Signed)
 [facility] at [facility] Discharge Instructions  You were seen today by Pleasant Barefoot PA-C for your iron deficiency anemia.   Your blood and iron levels are low, so we will schedule you for IV IRON x 2 doses.  You should also continue to follow-up with your gastroenterologist [facility] doctor) for ongoing monitoring and management of your intestinal blood loss.    Continue to take iron tablet daily.  However, if you are experiencing constipation, you can decrease your iron to take every other day.  You can DECREASE your vitamin B12 to take 1000 mcg every OTHER day (instead of daily).  You do NOT need to restart any vitamin D  supplement right now.  You should supplement your diet with Ensure (or Boost) once daily due to your decreased appetite and weight loss.    I will see you for follow-up visit in 3 months.  ** Thank you for trusting me with your healthcare!  I strive to provide all of my patients with quality care at each visit.  If you receive a survey for this visit, I would be so grateful to you for taking the time to provide feedback.  Thank you in advance!  ~ Nettie Wyffels                                        Dr. Mickiel Davonna Pleasant Barefoot, PA-C          Delon Hope, NP   - - - - - - - - - - - - - - - - - -     Thank you for choosing [facility] at [facility] to provide your oncology and hematology care.  To afford each patient quality time with our provider, please arrive at least 15 minutes before your scheduled appointment time.   If you have a lab appointment with the Cancer Center please come in thru the Main Entrance and check in at the main information desk.  You need to re-schedule your appointment should you arrive 10 or more minutes late.  We strive to give you quality time with our providers, and arriving late affects you and other patients whose appointments are after yours.  Also, if you no show three  or more times for appointments you may be dismissed from the clinic at the providers discretion.     Again, thank you for choosing [facility].  Our hope is that these requests will decrease the amount of time that you wait before being seen by our physicians.       _____________________________________________________________  Should you have questions after your visit to [facility], please contact our office at [phone] and follow the prompts.  Our office hours are 8:00 a.m. and 4:30 p.m. Monday - Friday.  Please note that voicemails left after 4:00 p.m. may not be returned until the following business day.  We are closed weekends and major holidays.  You do have access to a nurse 24-7, just call the main number to the clinic [phone] and do not press any options, hold on the line and a nurse will answer the phone.    For prescription refill requests, have your pharmacy contact our office and allow 72 hours.    Due to Covid, you will need to wear a mask upon  entering the hospital. If you do not have a mask, a mask will be given to you at the Main Entrance upon arrival. For doctor visits, patients may have 1 support person age 62 or older with them. For treatment visits, patients can not have anyone with them due to social distancing guidelines and our immunocompromised population.

## 2024-12-05 ENCOUNTER — Other Ambulatory Visit: Payer: Self-pay | Admitting: *Deleted

## 2024-12-09 ENCOUNTER — Other Ambulatory Visit: Payer: Self-pay | Admitting: Physician Assistant

## 2024-12-09 DIAGNOSIS — E559 Vitamin D deficiency, unspecified: Secondary | ICD-10-CM

## 2024-12-09 LAB — METHYLMALONIC ACID, SERUM: Methylmalonic Acid, Quantitative: 138 nmol/L (ref 0–378)

## 2024-12-12 ENCOUNTER — Inpatient Hospital Stay

## 2024-12-12 VITALS — BP 128/75 | HR 71 | Temp 96.7°F | Resp 19

## 2024-12-12 DIAGNOSIS — D508 Other iron deficiency anemias: Secondary | ICD-10-CM | POA: Diagnosis not present

## 2024-12-12 DIAGNOSIS — D500 Iron deficiency anemia secondary to blood loss (chronic): Secondary | ICD-10-CM

## 2024-12-12 MED ORDER — CETIRIZINE HCL 10 MG PO TABS: 5.0000 mg | ORAL_TABLET | Freq: Once | ORAL | Status: DC

## 2024-12-12 MED ORDER — ACETAMINOPHEN 325 MG PO TABS
650.0000 mg | ORAL_TABLET | Freq: Once | ORAL | Status: AC
  Administered 2024-12-12: 650 mg via ORAL
  Filled 2024-12-12: qty 2

## 2024-12-12 MED ORDER — SODIUM CHLORIDE 0.9 % IV SOLN: INTRAVENOUS | Status: DC

## 2024-12-12 MED ORDER — CETIRIZINE HCL 10 MG/ML IV SOLN
5.0000 mg | Freq: Once | INTRAVENOUS | Status: AC
  Administered 2024-12-12: 5 mg via INTRAVENOUS
  Filled 2024-12-12: qty 1

## 2024-12-12 MED ORDER — SODIUM CHLORIDE 0.9 % IV SOLN
510.0000 mg | Freq: Once | INTRAVENOUS | Status: AC
  Administered 2024-12-12: 510 mg via INTRAVENOUS
  Filled 2024-12-12: qty 510

## 2024-12-12 NOTE — Patient Instructions (Signed)

## 2024-12-12 NOTE — Progress Notes (Signed)
 Patient tolerated iron infusion with no complaints voiced.  Peripheral IV site clean and dry with good blood return noted before and after infusion.  Band aid applied. Pt observed for 30 minutes post iron without any complications.  VSS with discharge and left in satisfactory condition with no s/s of distress noted. All follow ups as scheduled.   Sherika Kubicki

## 2024-12-13 ENCOUNTER — Inpatient Hospital Stay

## 2024-12-20 ENCOUNTER — Inpatient Hospital Stay

## 2024-12-20 VITALS — BP 110/65 | HR 64 | Temp 97.8°F | Resp 18

## 2024-12-20 DIAGNOSIS — D500 Iron deficiency anemia secondary to blood loss (chronic): Secondary | ICD-10-CM

## 2024-12-20 MED ORDER — SODIUM CHLORIDE 0.9 % IV SOLN
510.0000 mg | Freq: Once | INTRAVENOUS | Status: AC
  Administered 2024-12-20: 510 mg via INTRAVENOUS
  Filled 2024-12-20: qty 510

## 2024-12-20 MED ORDER — ACETAMINOPHEN 325 MG PO TABS
650.0000 mg | ORAL_TABLET | Freq: Once | ORAL | Status: AC
  Administered 2024-12-20: 650 mg via ORAL
  Filled 2024-12-20: qty 2

## 2024-12-20 MED ORDER — CETIRIZINE HCL 10 MG/ML IV SOLN
5.0000 mg | Freq: Once | INTRAVENOUS | Status: AC
  Administered 2024-12-20: 5 mg via INTRAVENOUS
  Filled 2024-12-20: qty 1

## 2024-12-20 MED ORDER — SODIUM CHLORIDE 0.9 % IV SOLN: INTRAVENOUS | Status: DC

## 2024-12-20 NOTE — Patient Instructions (Signed)

## 2024-12-20 NOTE — Progress Notes (Signed)
 Patient presents today for [name] infusion per providers order.  Vital signs WNL.  Patient has no new complaints at this time.  Peripheral IV started and blood return noted pre and post infusion.  Stable during infusion without adverse affects.  Vital signs stable.  No complaints at this time.  Discharge from clinic ambulatory in stable condition.  Alert and oriented X 3.  Follow up with [facility] as scheduled.

## 2025-03-04 ENCOUNTER — Inpatient Hospital Stay

## 2025-03-11 ENCOUNTER — Inpatient Hospital Stay: Admitting: Physician Assistant
